# Patient Record
Sex: Female | Born: 1950 | Race: White | Hispanic: No | Marital: Married | Smoking: Never smoker
Health system: Southern US, Community
[De-identification: ages and names within clinical notes are randomized; demographics above are authoritative.]

## PROBLEM LIST (undated history)

## (undated) DIAGNOSIS — E119 Type 2 diabetes mellitus without complications: Secondary | ICD-10-CM

## (undated) DIAGNOSIS — R0600 Dyspnea, unspecified: Secondary | ICD-10-CM

## (undated) DIAGNOSIS — C3490 Malignant neoplasm of unspecified part of unspecified bronchus or lung: Secondary | ICD-10-CM

## (undated) DIAGNOSIS — C801 Malignant (primary) neoplasm, unspecified: Secondary | ICD-10-CM

## (undated) DIAGNOSIS — E7800 Pure hypercholesterolemia, unspecified: Secondary | ICD-10-CM

## (undated) HISTORY — DX: Type 2 diabetes mellitus without complications: E11.9

## (undated) HISTORY — PX: ABDOMINAL HYSTERECTOMY: SHX81

## (undated) HISTORY — PX: TUBAL LIGATION: SHX77

## (undated) HISTORY — PX: BREAST BIOPSY: SHX20

---

## 2000-01-19 ENCOUNTER — Encounter: Admission: RE | Admit: 2000-01-19 | Discharge: 2000-01-19 | Payer: Self-pay | Admitting: Internal Medicine

## 2000-01-19 ENCOUNTER — Encounter: Payer: Self-pay | Admitting: Internal Medicine

## 2000-10-09 ENCOUNTER — Other Ambulatory Visit: Admission: RE | Admit: 2000-10-09 | Discharge: 2000-10-09 | Payer: Self-pay | Admitting: *Deleted

## 2001-09-20 ENCOUNTER — Other Ambulatory Visit: Admission: RE | Admit: 2001-09-20 | Discharge: 2001-09-20 | Payer: Self-pay | Admitting: Obstetrics and Gynecology

## 2001-12-05 ENCOUNTER — Encounter: Payer: Self-pay | Admitting: Obstetrics and Gynecology

## 2001-12-05 ENCOUNTER — Encounter: Admission: RE | Admit: 2001-12-05 | Discharge: 2001-12-05 | Payer: Self-pay | Admitting: Obstetrics and Gynecology

## 2003-04-08 ENCOUNTER — Encounter: Admission: RE | Admit: 2003-04-08 | Discharge: 2003-04-08 | Payer: Self-pay | Admitting: Obstetrics and Gynecology

## 2004-05-20 ENCOUNTER — Encounter: Admission: RE | Admit: 2004-05-20 | Discharge: 2004-05-20 | Payer: Self-pay | Admitting: Gynecology

## 2004-09-08 ENCOUNTER — Other Ambulatory Visit: Admission: RE | Admit: 2004-09-08 | Discharge: 2004-09-08 | Payer: Self-pay | Admitting: Gynecology

## 2004-09-17 ENCOUNTER — Inpatient Hospital Stay (HOSPITAL_COMMUNITY): Admission: RE | Admit: 2004-09-17 | Discharge: 2004-09-19 | Payer: Self-pay | Admitting: Urology

## 2004-09-17 ENCOUNTER — Encounter (INDEPENDENT_AMBULATORY_CARE_PROVIDER_SITE_OTHER): Payer: Self-pay | Admitting: Specialist

## 2005-06-17 ENCOUNTER — Encounter: Admission: RE | Admit: 2005-06-17 | Discharge: 2005-06-17 | Payer: Self-pay | Admitting: Gynecology

## 2005-06-22 ENCOUNTER — Encounter: Admission: RE | Admit: 2005-06-22 | Discharge: 2005-06-22 | Payer: Self-pay | Admitting: Gynecology

## 2005-06-23 ENCOUNTER — Encounter: Admission: RE | Admit: 2005-06-23 | Discharge: 2005-06-23 | Payer: Self-pay | Admitting: Gynecology

## 2005-06-23 ENCOUNTER — Encounter (INDEPENDENT_AMBULATORY_CARE_PROVIDER_SITE_OTHER): Payer: Self-pay | Admitting: Specialist

## 2005-10-06 ENCOUNTER — Other Ambulatory Visit: Admission: RE | Admit: 2005-10-06 | Discharge: 2005-10-06 | Payer: Self-pay | Admitting: Gynecology

## 2007-02-01 ENCOUNTER — Encounter: Admission: RE | Admit: 2007-02-01 | Discharge: 2007-02-01 | Payer: Self-pay | Admitting: Allergy

## 2007-02-01 IMAGING — MG MM SCREEN MAMMOGRAM BILATERAL
4 series · 4 of 4 positions shown · non-contrast
Comparison: Prior studies.

DG SCREEN MAMMOGRAM BILATERAL
Bilateral CC and MLO view(s) were taken.

DIGITAL SCREENING MAMMOGRAM WITH CAD:

[R CC]
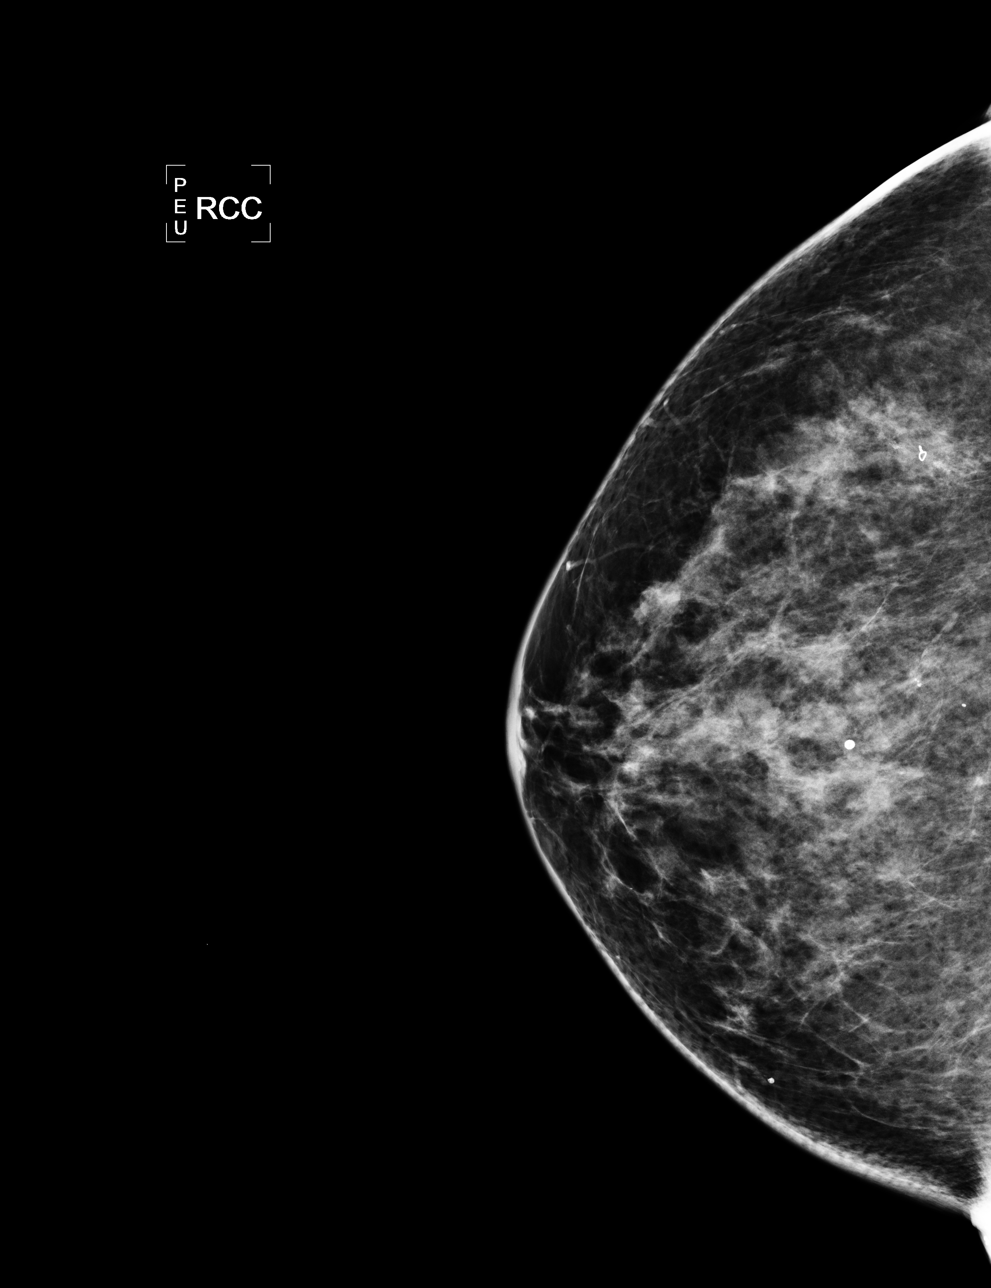

[L CC]
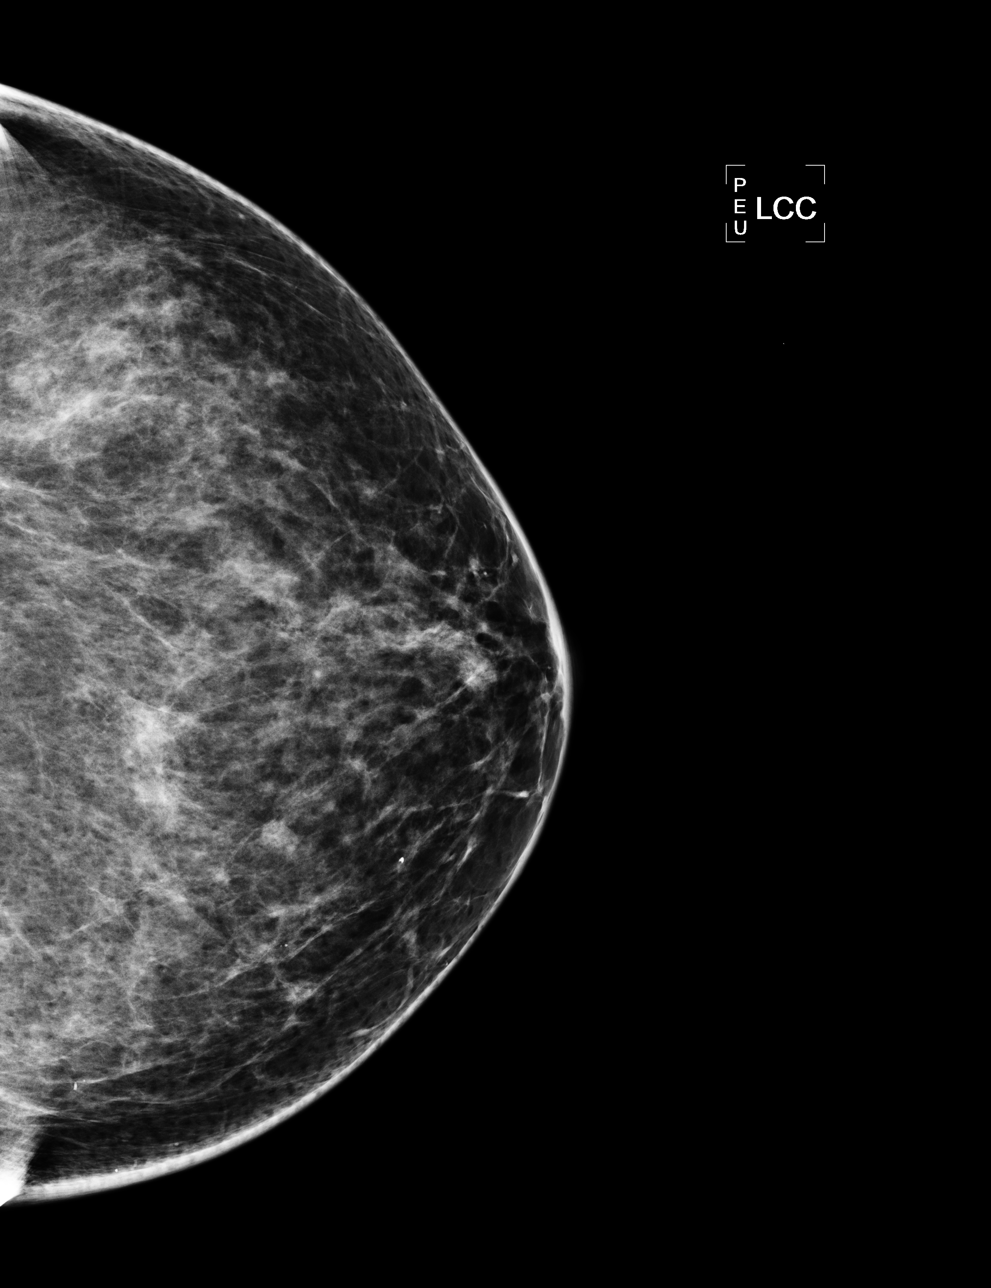

[L MLO]
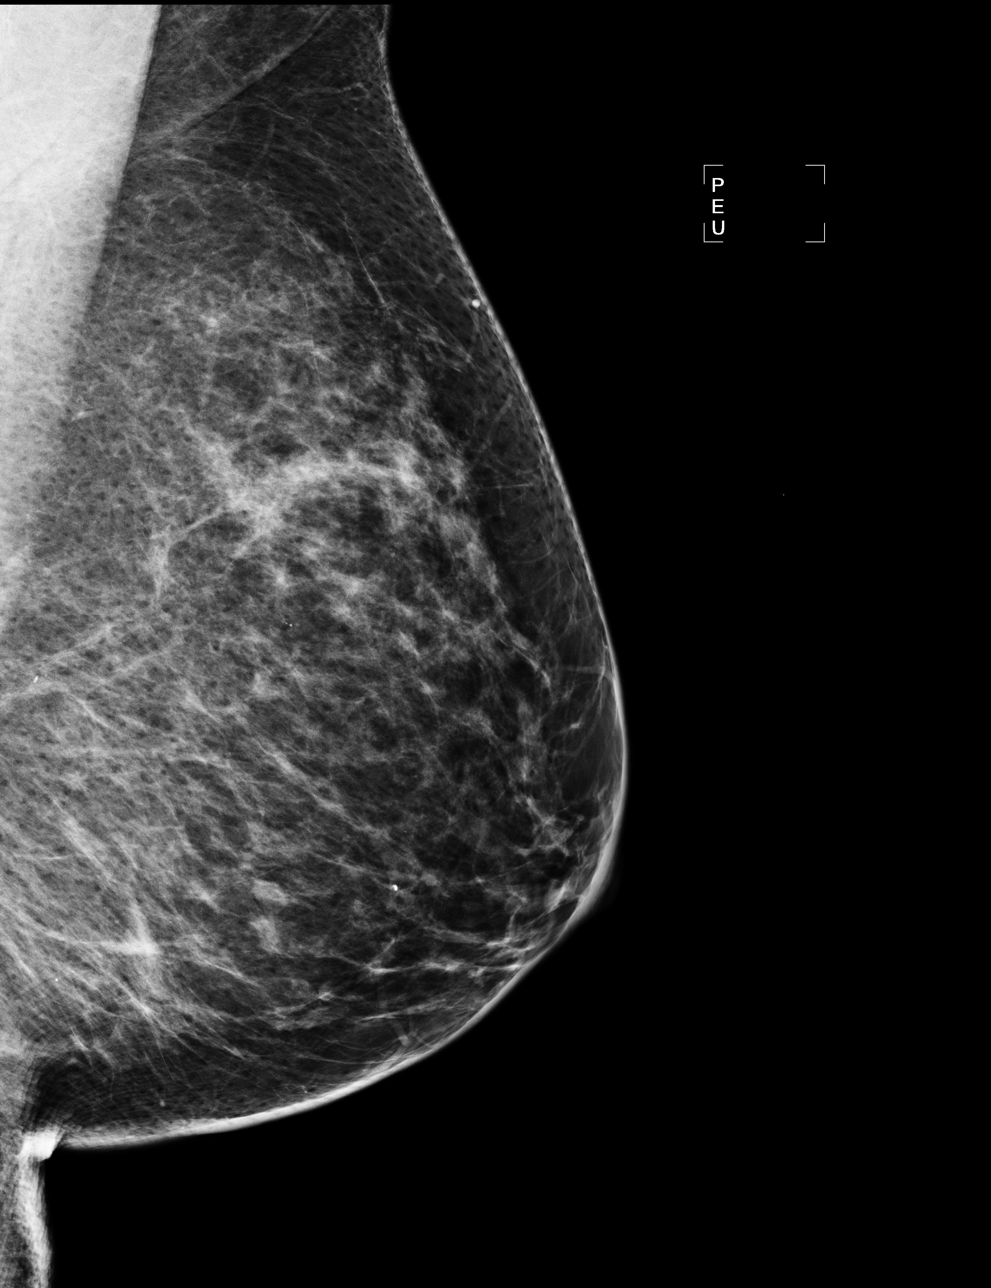

[R MLO]
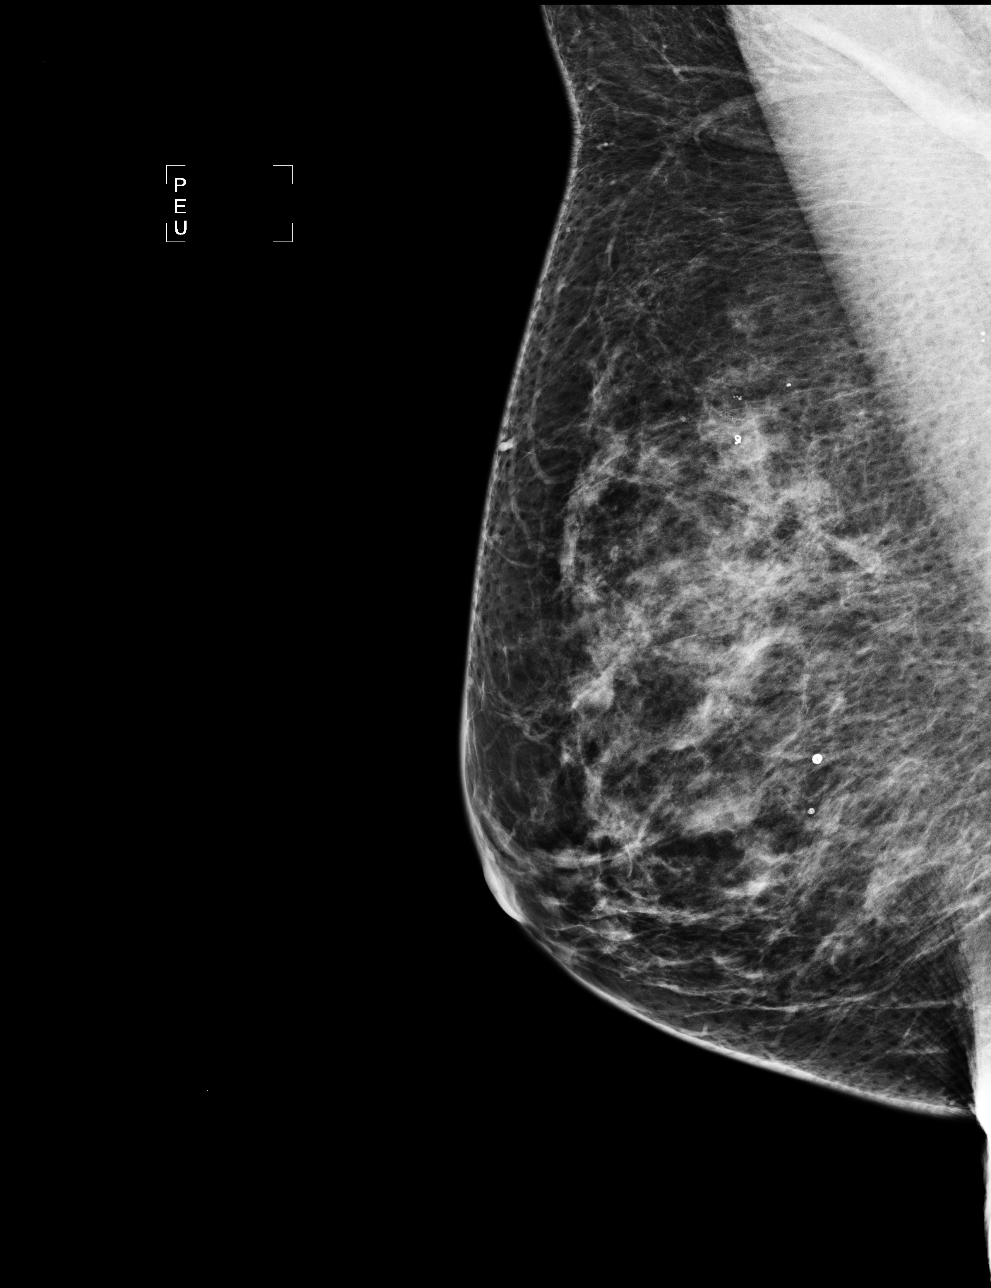

[4 of 4 positions shown; findings below may reference images not displayed]

The breast tissue is heterogeneously dense.  There is no dominant mass, architectural distortion or
calcification to suggest malignancy.
IMPRESSION: No mammographic evidence of malignancy.  Suggest yearly screening mammography.

ASSESSMENT: Negative - BI-RADS 1

Screening mammogram in 1 year.
ANALYZED BY COMPUTER AIDED DETECTION. , THIS PROCEDURE WAS A DIGITAL MAMMOGRAM.

## 2008-03-07 ENCOUNTER — Encounter: Admission: RE | Admit: 2008-03-07 | Discharge: 2008-03-07 | Payer: Self-pay | Admitting: Family Medicine

## 2008-03-07 IMAGING — MG MM SCREEN MAMMOGRAM BILATERAL
4 series · 4 of 4 positions shown · non-contrast
Comparison: Prior studies.

DG SCREEN MAMMOGRAM BILATERAL
Bilateral CC and MLO view(s) were taken.
Technologist: YOEL(YOEL)
Prior study comparison: [DATE], bilateral screening mammogram.

DIGITAL SCREENING MAMMOGRAM WITH CAD:

[R CC]
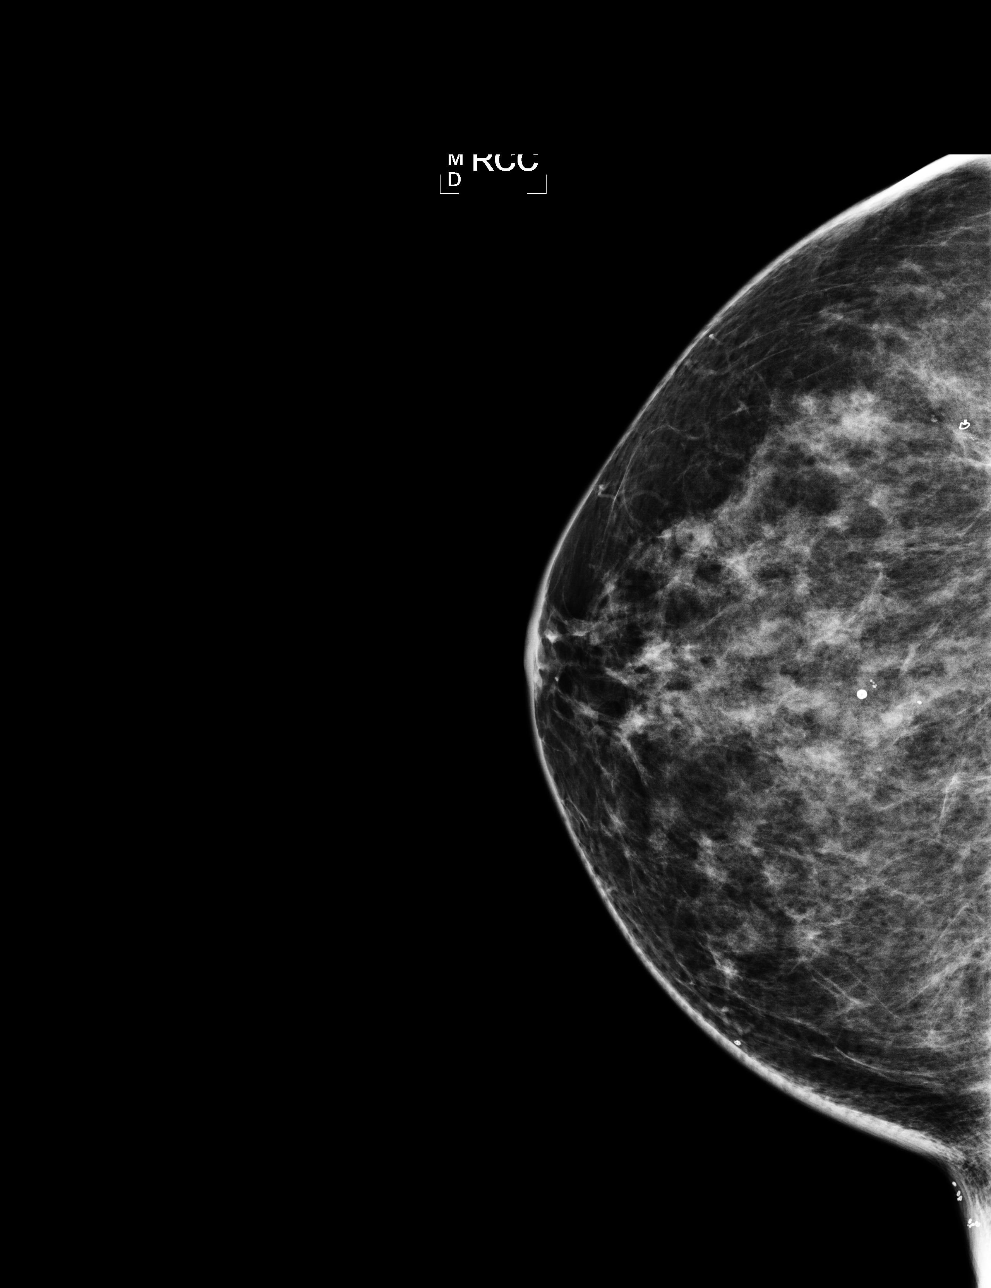

[L CC]
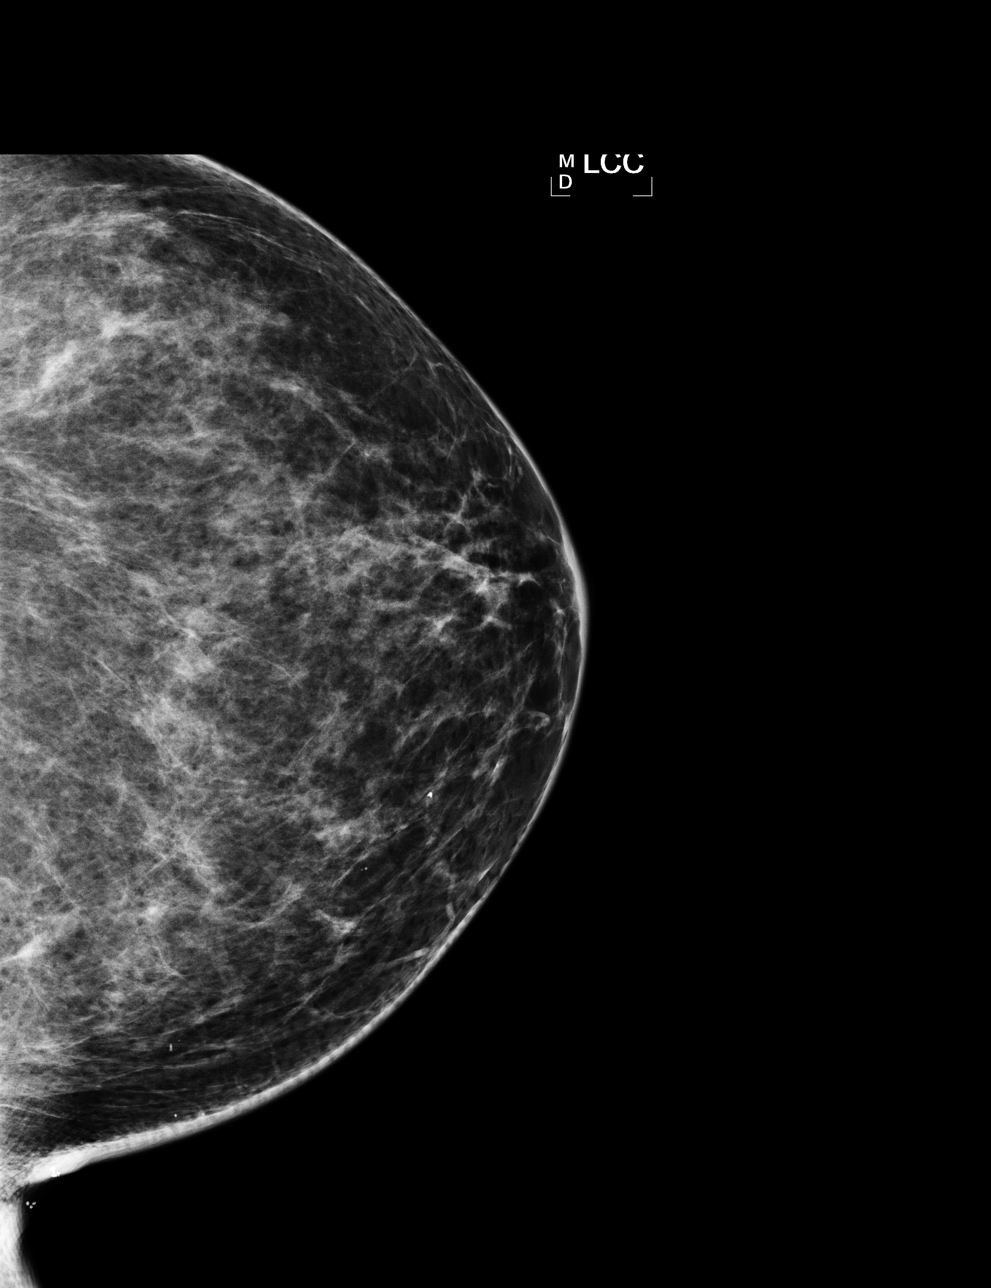

[L MLO]
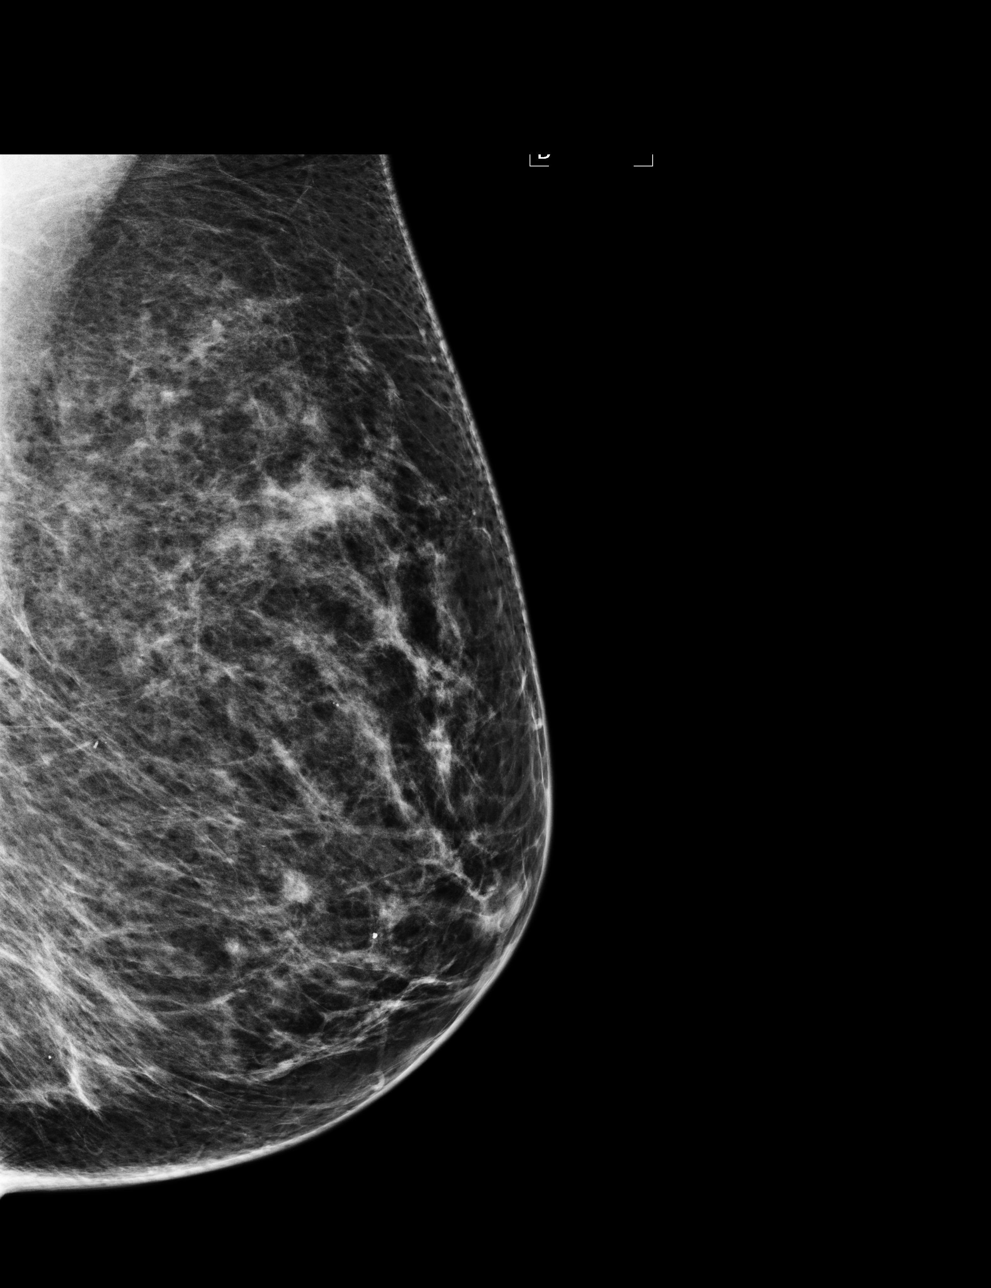

[R MLO]
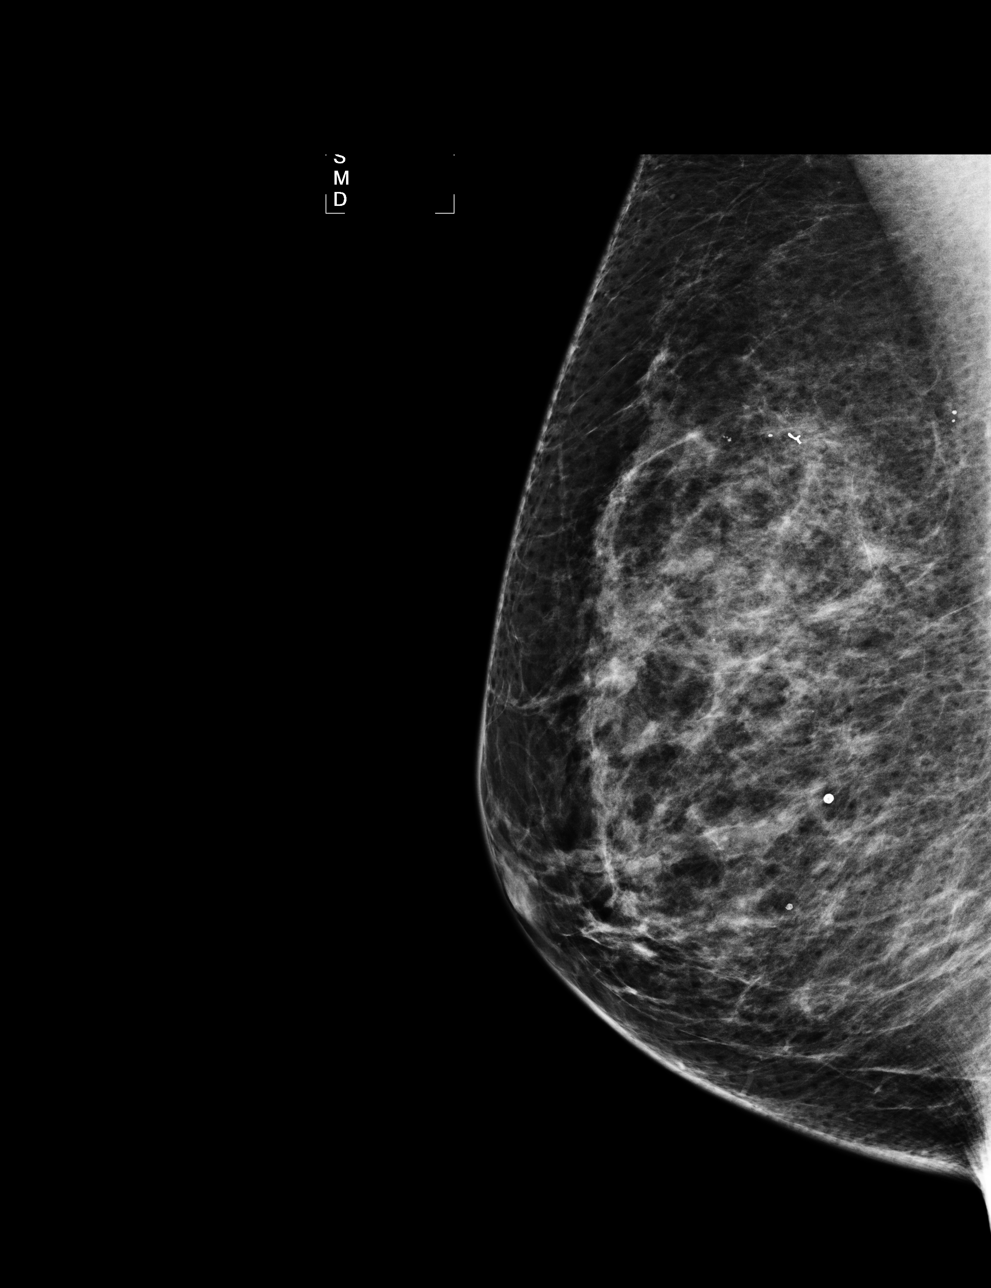

[4 of 4 positions shown; findings below may reference images not displayed]

There are scattered fibroglandular densities.  There is no dominant mass, architectural distortion 
or calcification to suggest malignancy.
IMPRESSION: No mammographic evidence of malignancy.  Suggest yearly screening mammography.

ASSESSMENT: Negative - BI-RADS 1

Screening mammogram in 1 year.
ANALYZED BY COMPUTER AIDED DETECTION. , THIS PROCEDURE WAS A DIGITAL MAMMOGRAM.

## 2009-03-18 ENCOUNTER — Encounter: Admission: RE | Admit: 2009-03-18 | Discharge: 2009-03-18 | Payer: Self-pay | Admitting: Family Medicine

## 2009-03-18 IMAGING — MG MM SCREEN MAMMOGRAM BILATERAL
4 series · 4 of 4 positions shown · non-contrast
Comparison: none

DG SCREEN MAMMOGRAM BILATERAL
Bilateral CC and MLO view(s) were taken.

DIGITAL SCREENING MAMMOGRAM WITH CAD:
The breast tissue is heterogeneously dense.  A possible mass is noted in the left breast.  Spot 
compression views and possibly sonography are recommended for further evaluation.  In the right 
breast, no masses or malignant type calcifications are identified.  Compared with prior studies.
Images were processed with CAD.

[R CC]
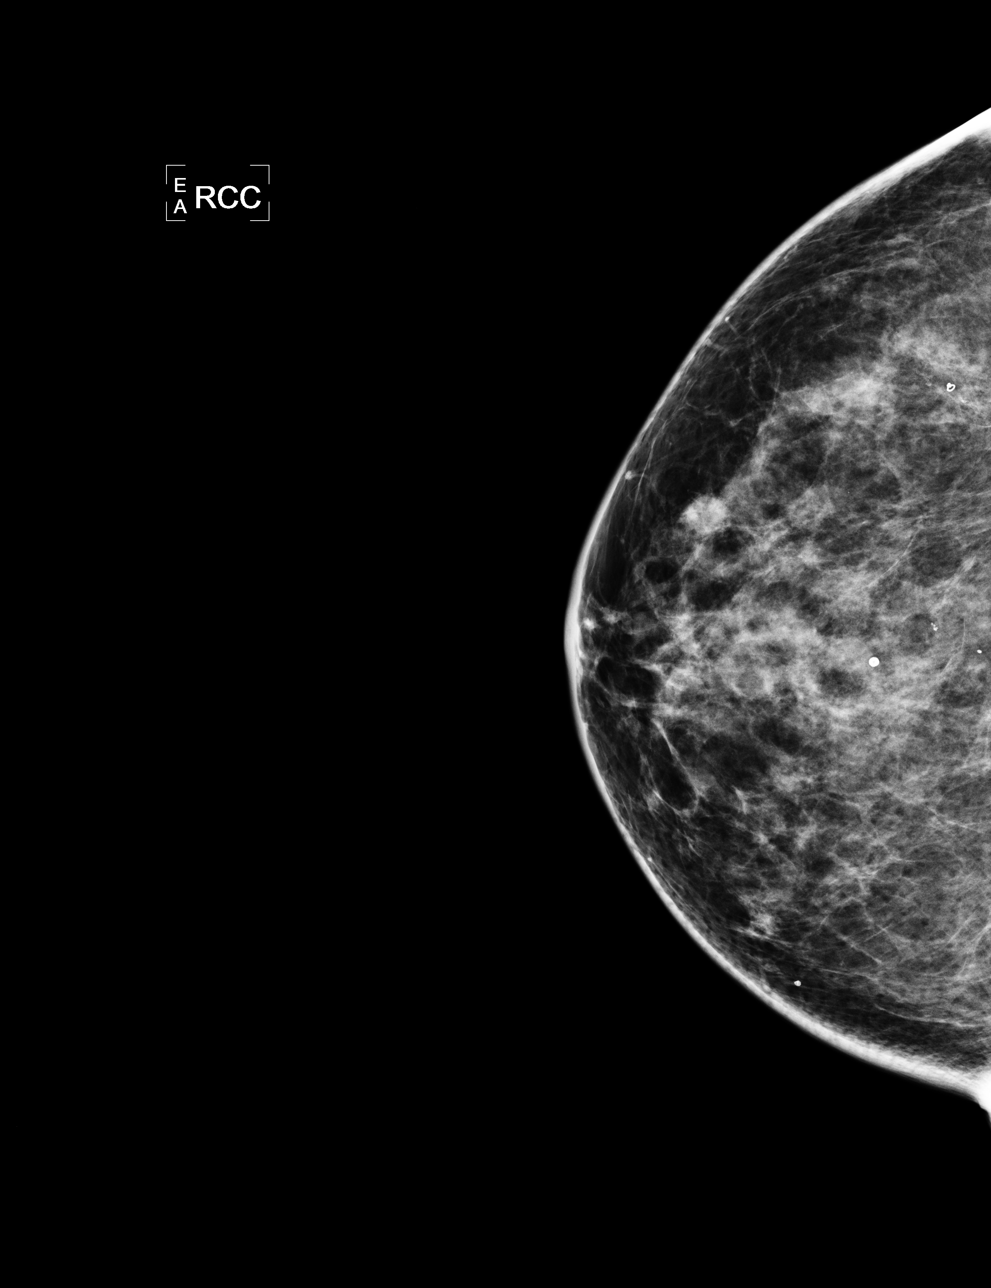

[L CC]
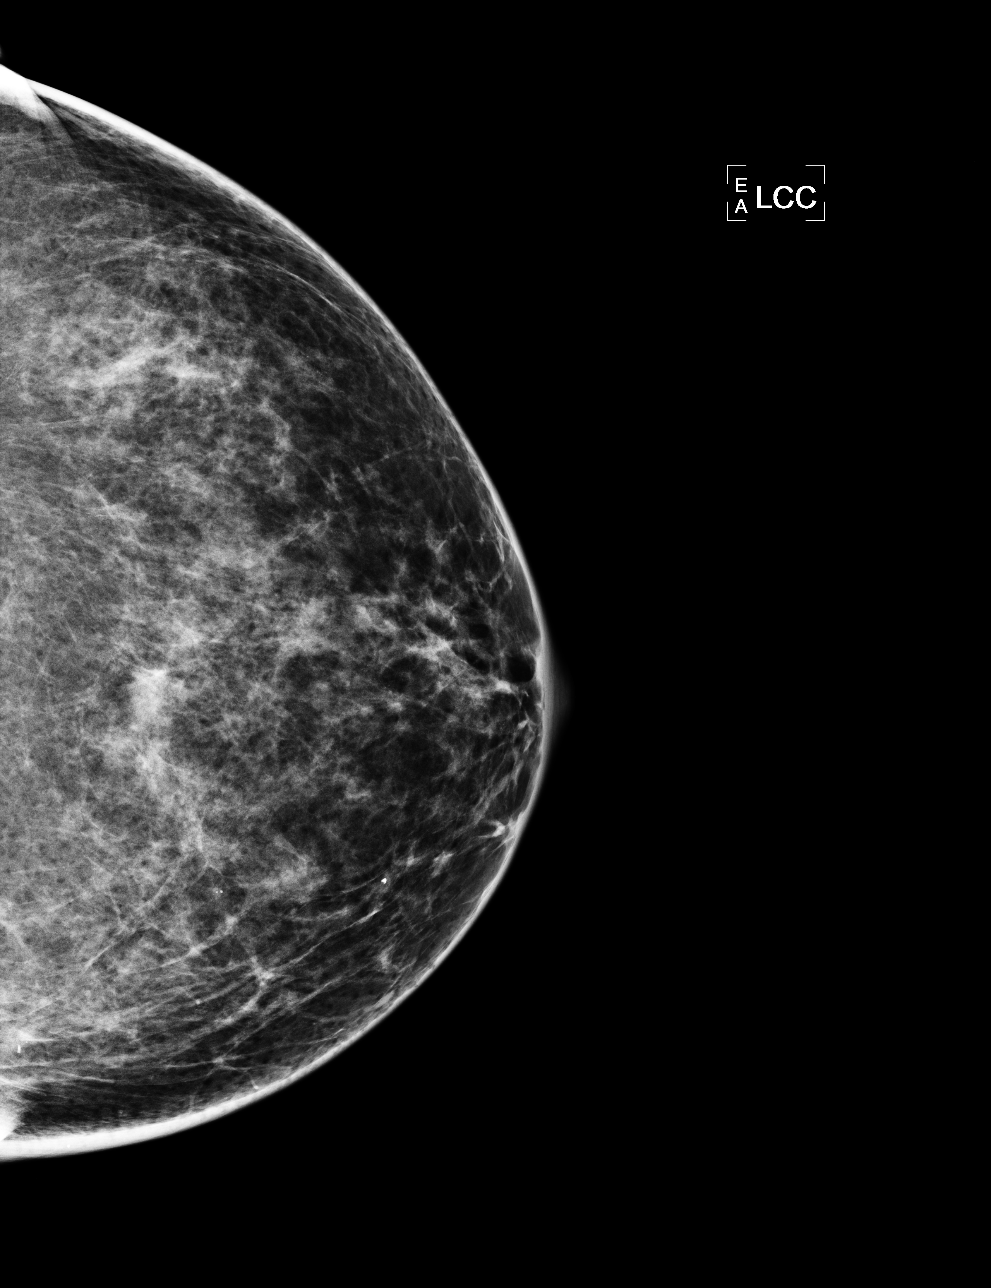

[L MLO]
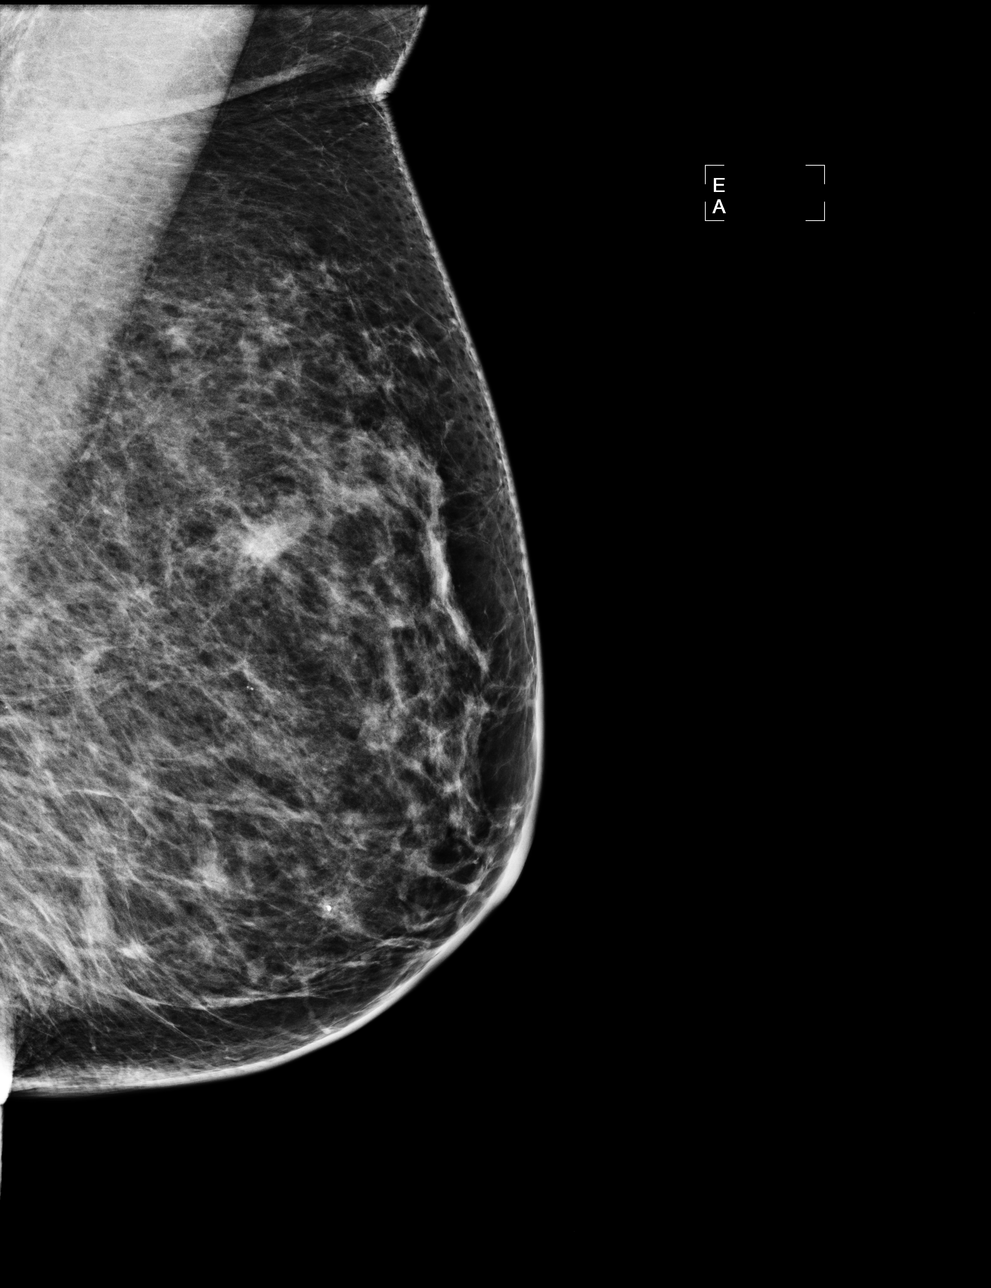

[R MLO]
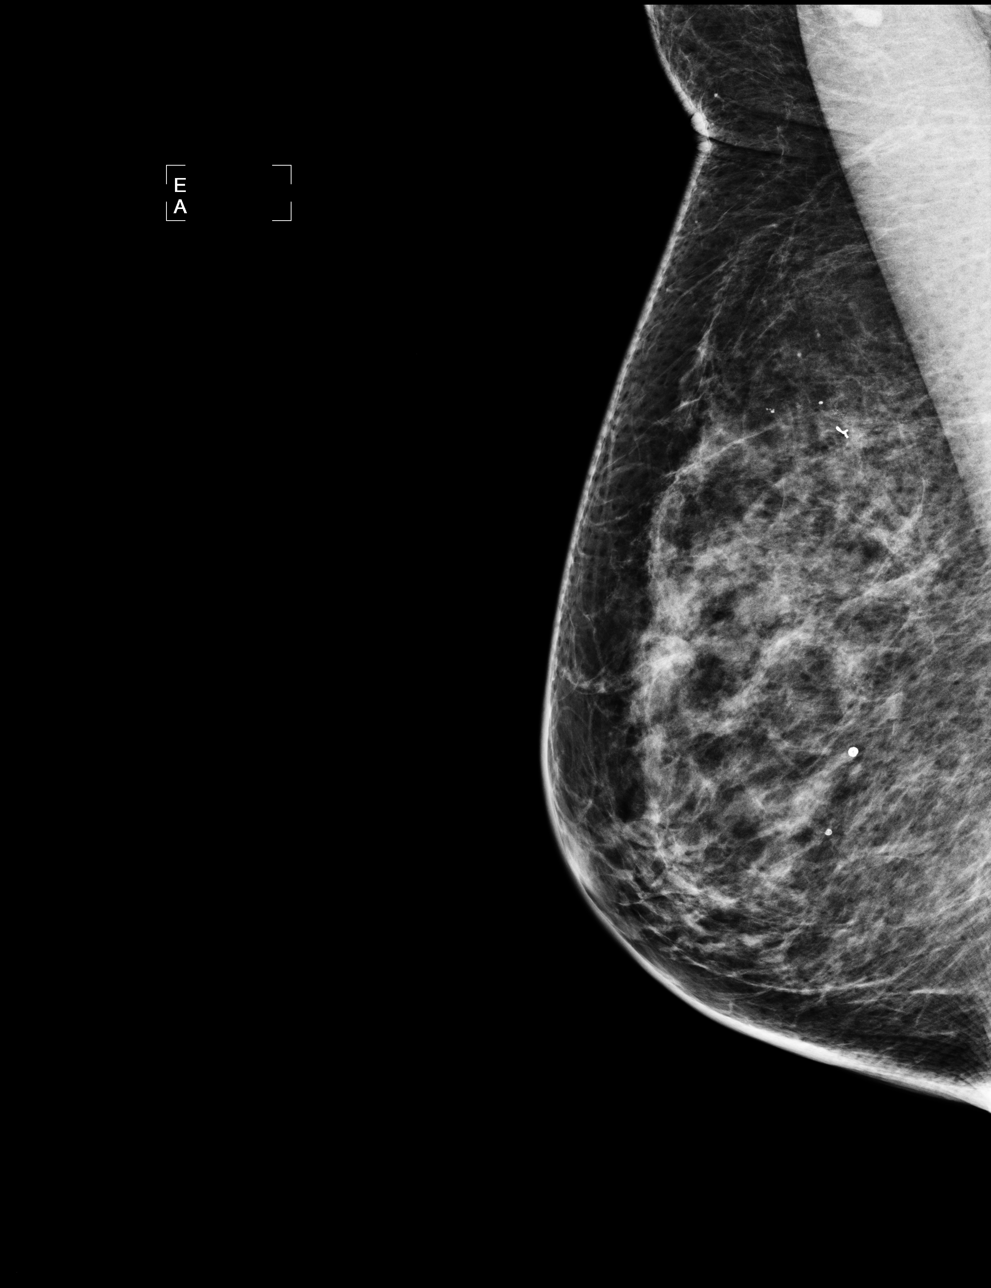

[4 of 4 positions shown; findings below may reference images not displayed]

IMPRESSION: Possible mass, left breast.  Additional evaluation is indicated.  The patient will be contacted for
additional studies and a supplementary report will follow.  No specific mammographic evidence of 
malignancy, right breast.

ASSESSMENT: Need additional imaging evaluation and/or prior mammograms for comparison - BI-RADS 0

Further imaging of the left breast.
,

## 2009-03-23 ENCOUNTER — Encounter: Admission: RE | Admit: 2009-03-23 | Discharge: 2009-03-23 | Payer: Self-pay | Admitting: Family Medicine

## 2009-03-23 IMAGING — MG MM DIAGNOSTIC LTD LEFT
2 series · 2 of 2 positions shown · non-contrast
Comparison: Prior studies

CLINICAL DATA: Abnormal screening mammogram.

DIGITAL DIAGNOSTIC  LEFT BREAST  MAMMOGRAM   AND LEFT BREAST
ULTRASOUND:

[L CC]
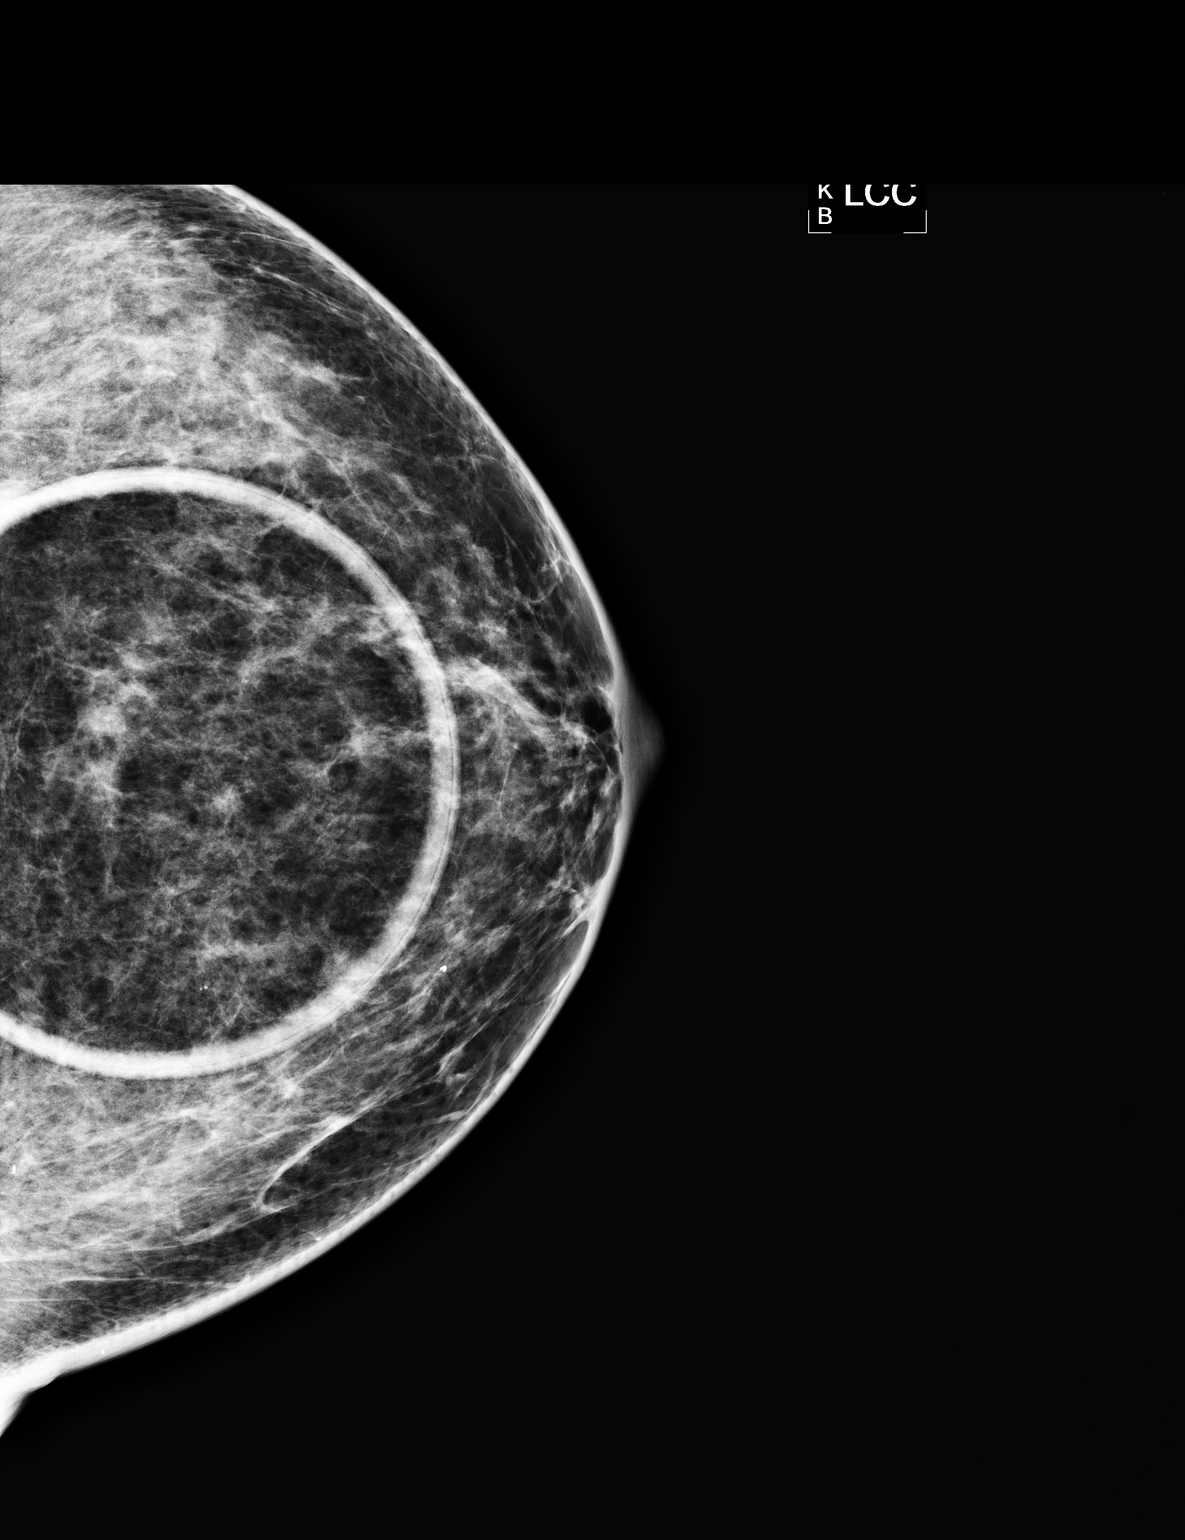

[L MLO]
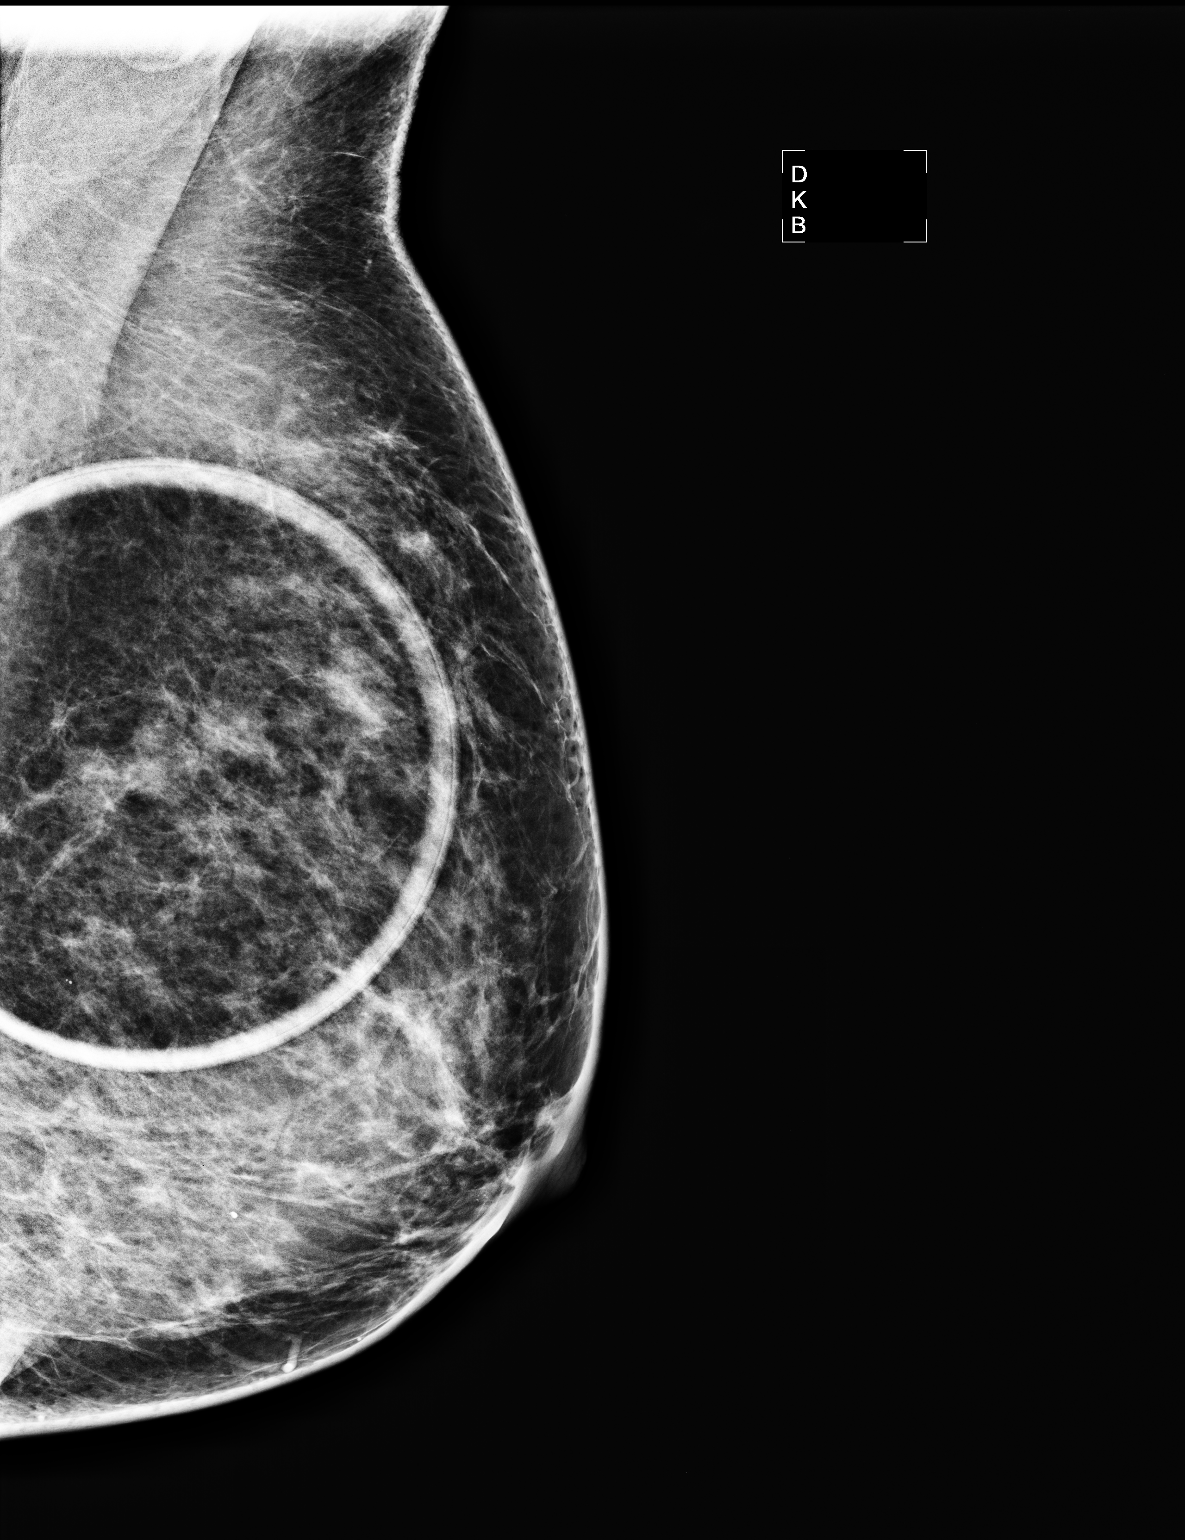

[2 of 2 positions shown; findings below may reference images not displayed]

FINDINGS: Spot compression views of the left breast demonstrate
that the area of questionable asymmetric density located superiorly
within the left breast appears similar to prior mammograms.  Most
likely the appearance is due to a summation shadow.

On physical exam, there is no discrete palpable abnormality.

Ultrasound is performed, showing normal-appearing fibroglandular
tissue within the superior portion of the left breast.  There is no
mass, cyst, distortion, or worrisome shadowing.
IMPRESSION: The area of questionable developing density located superiorly
within the left breast appears less prominent on additional views.
Most likely the appearance is due to a summation shadow.  However,
given the appearance on the initial screening study,  I recommend a
follow-up left breast diagnostic mammogram in 6 months.

BI-RADS CATEGORY 3:  Probably benign finding(s) - short interval
follow-up suggested.

## 2009-09-18 ENCOUNTER — Encounter: Admission: RE | Admit: 2009-09-18 | Discharge: 2009-09-18 | Payer: Self-pay | Admitting: Family Medicine

## 2009-09-18 IMAGING — MG MM DIAGNOSTIC UNILATERAL L
4 series · 4 of 4 positions shown · non-contrast
Comparison: [DATE] [DATE], [DATE], [DATE] [DATE], [DATE], [DATE] [DATE], [DATE],
[DATE] [DATE], [DATE], [DATE] [DATE], [DATE]

CLINICAL DATA: 6-month reevaluation left breast

DIGITAL DIAGNOSTIC LEFT MAMMOGRAM WITH CAD

[L CC]
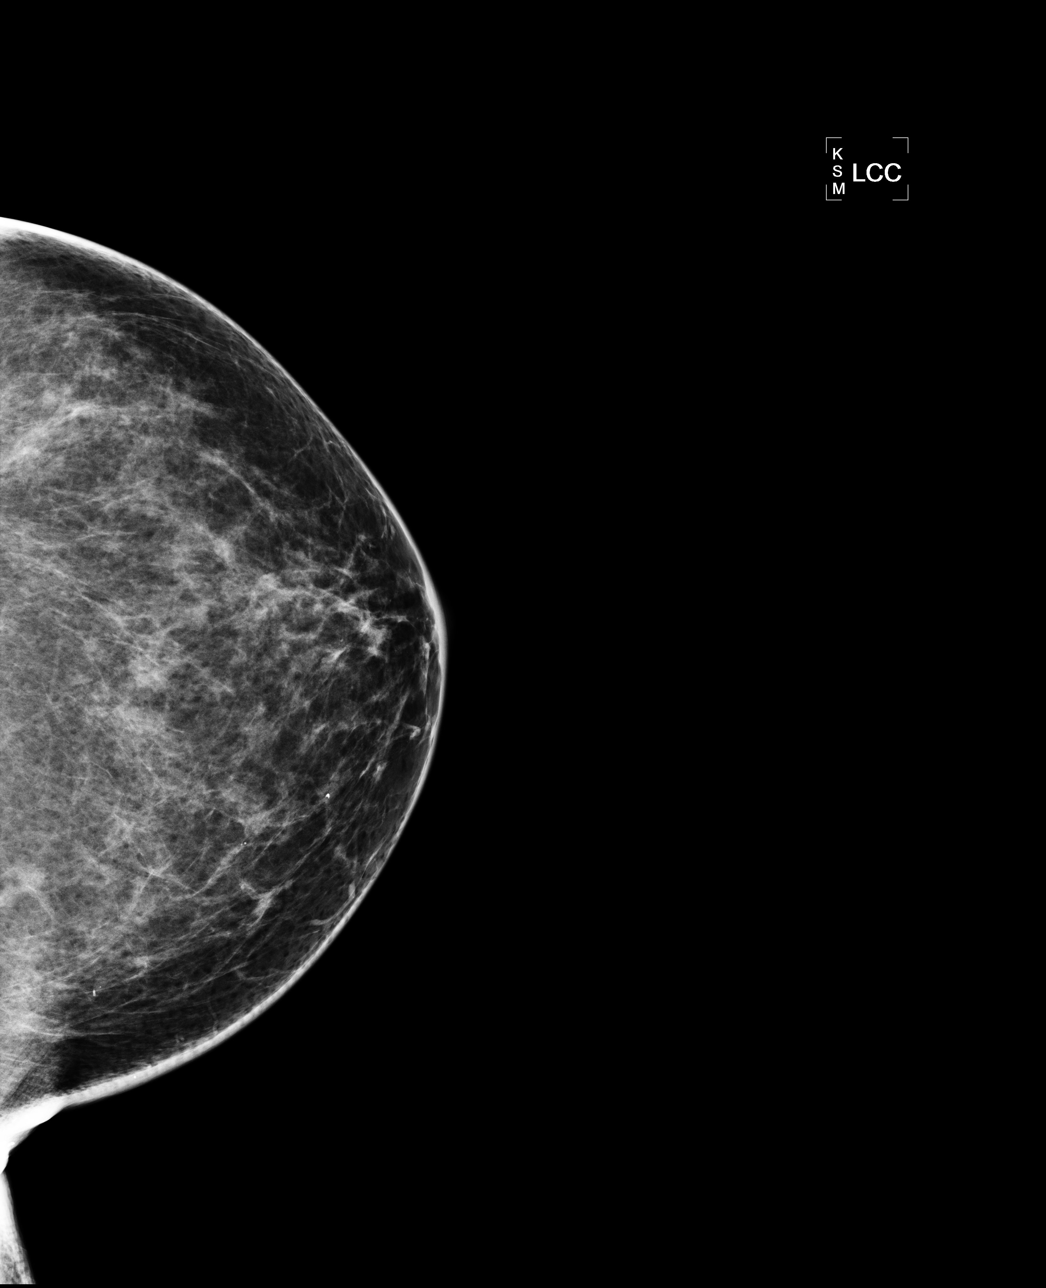

[L MLO (1 of 2)]
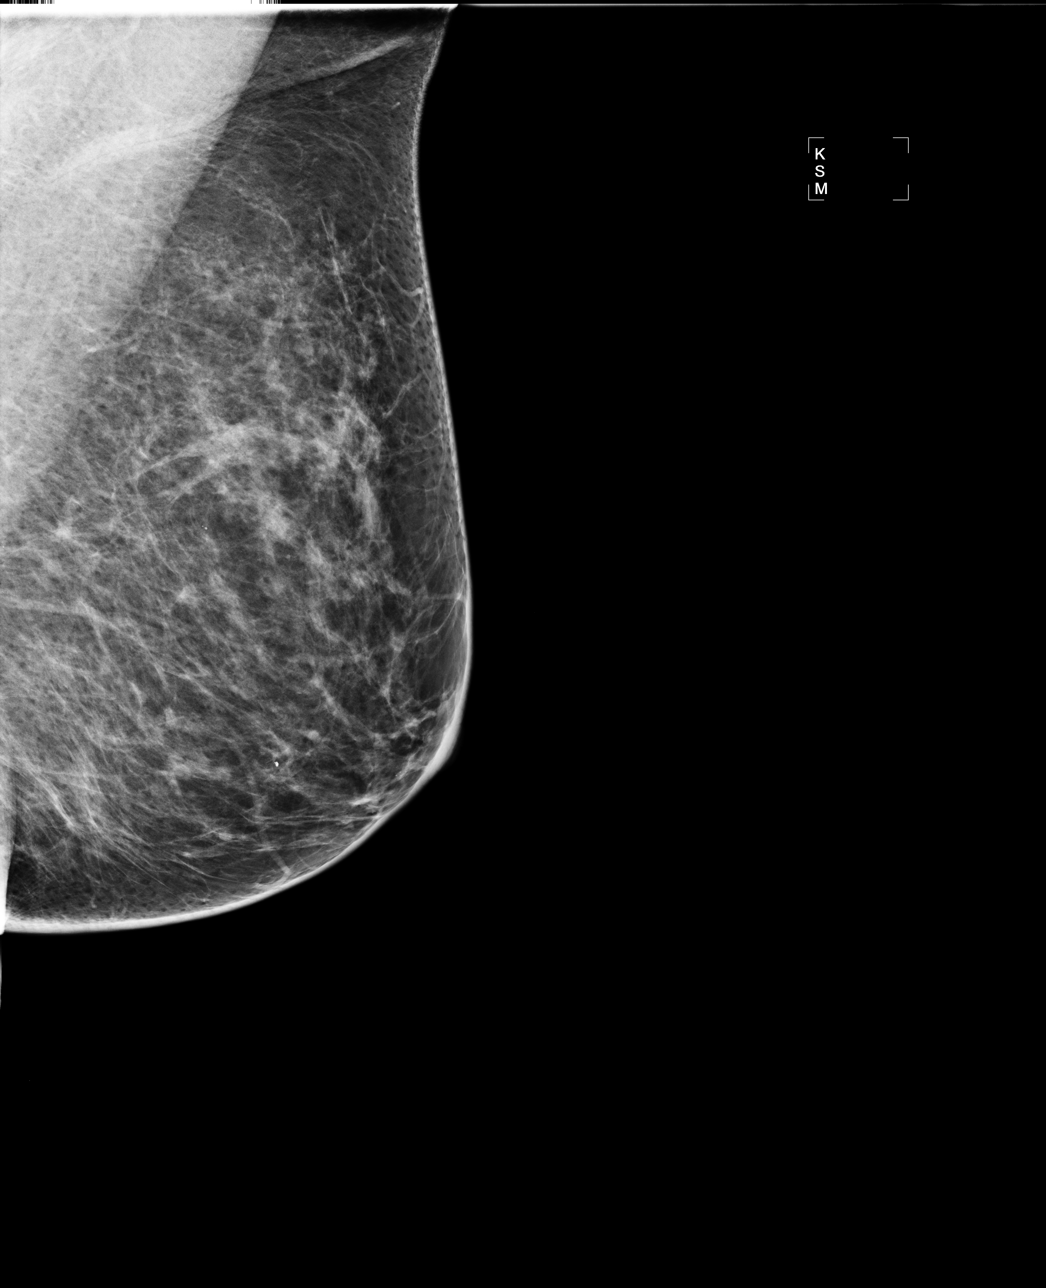

[L MLO (2 of 2)]
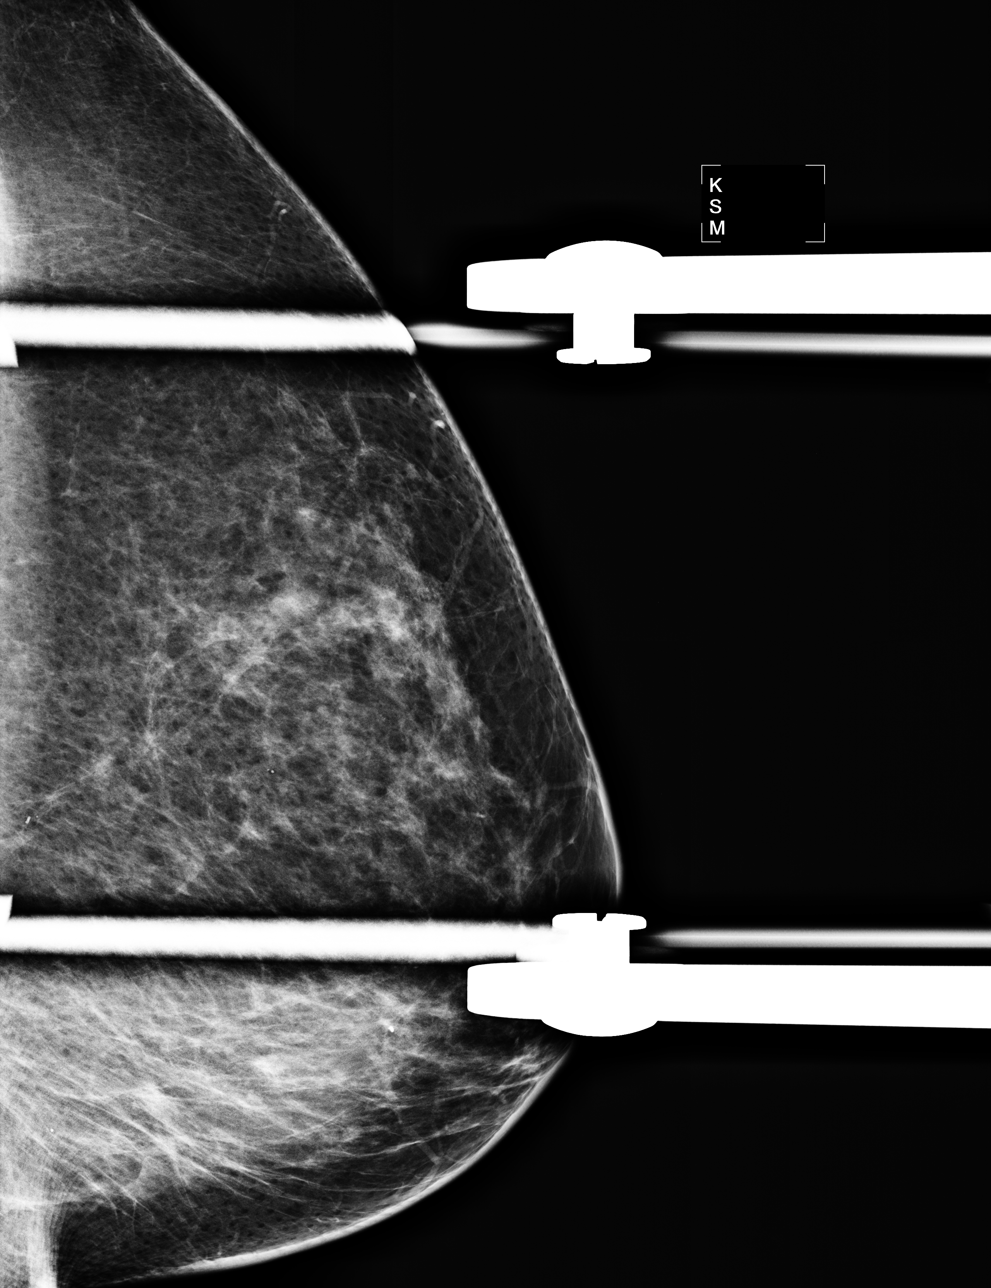

[L ML]
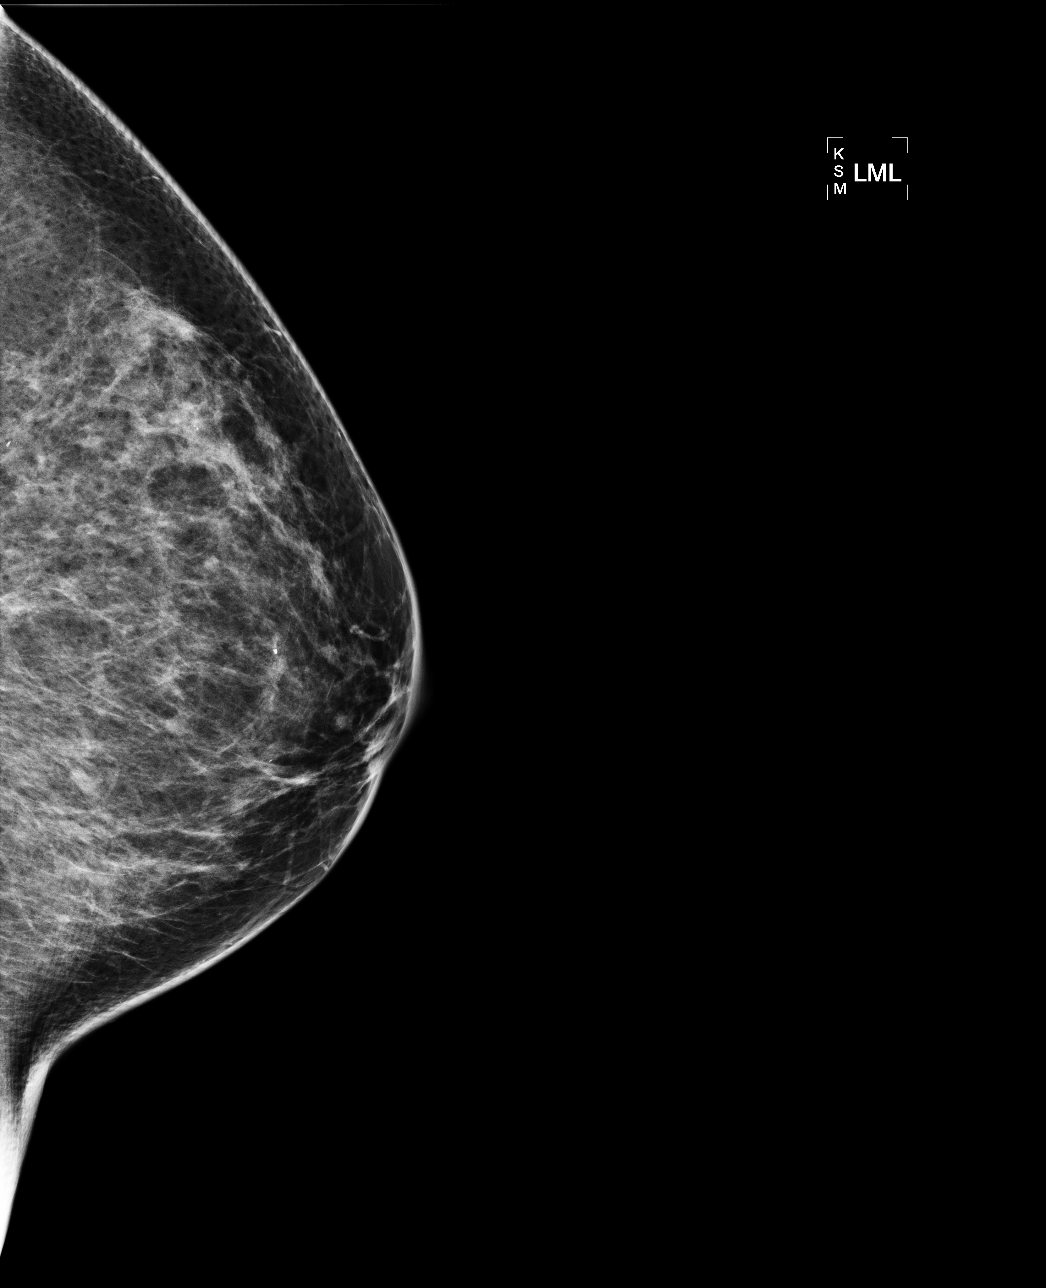

[4 of 4 positions shown; findings below may reference images not displayed]

FINDINGS: CC and MLO views of the left breast, lateral view left
breast, spot compression left MLO view are submitted.  Previously
noted asymmetry is not seen on the left CC view.  On the MLO view
the breast parenchyma in that area is unchanged compared prior exam
[DATE].
Mammographic images were processed with CAD.
IMPRESSION: Probable benign findings, recommend 6-month follow-up mammogram
left breast.

BI-RADS CATEGORY 3:  Probably benign finding(s) - short interval
follow-up suggested.

## 2010-03-22 ENCOUNTER — Encounter: Admission: RE | Admit: 2010-03-22 | Discharge: 2010-03-22 | Payer: Self-pay | Admitting: Family Medicine

## 2010-03-22 IMAGING — MG MM DIGITAL DIAGNOSTIC BILAT
4 series · 4 of 4 positions shown · non-contrast
Comparison: Prior studies

CLINICAL DATA: Short-term follow-up for questioned left breast
asymmetry

DIGITAL DIAGNOSTIC BILATERAL MAMMOGRAM WITH CAD

[R CC]
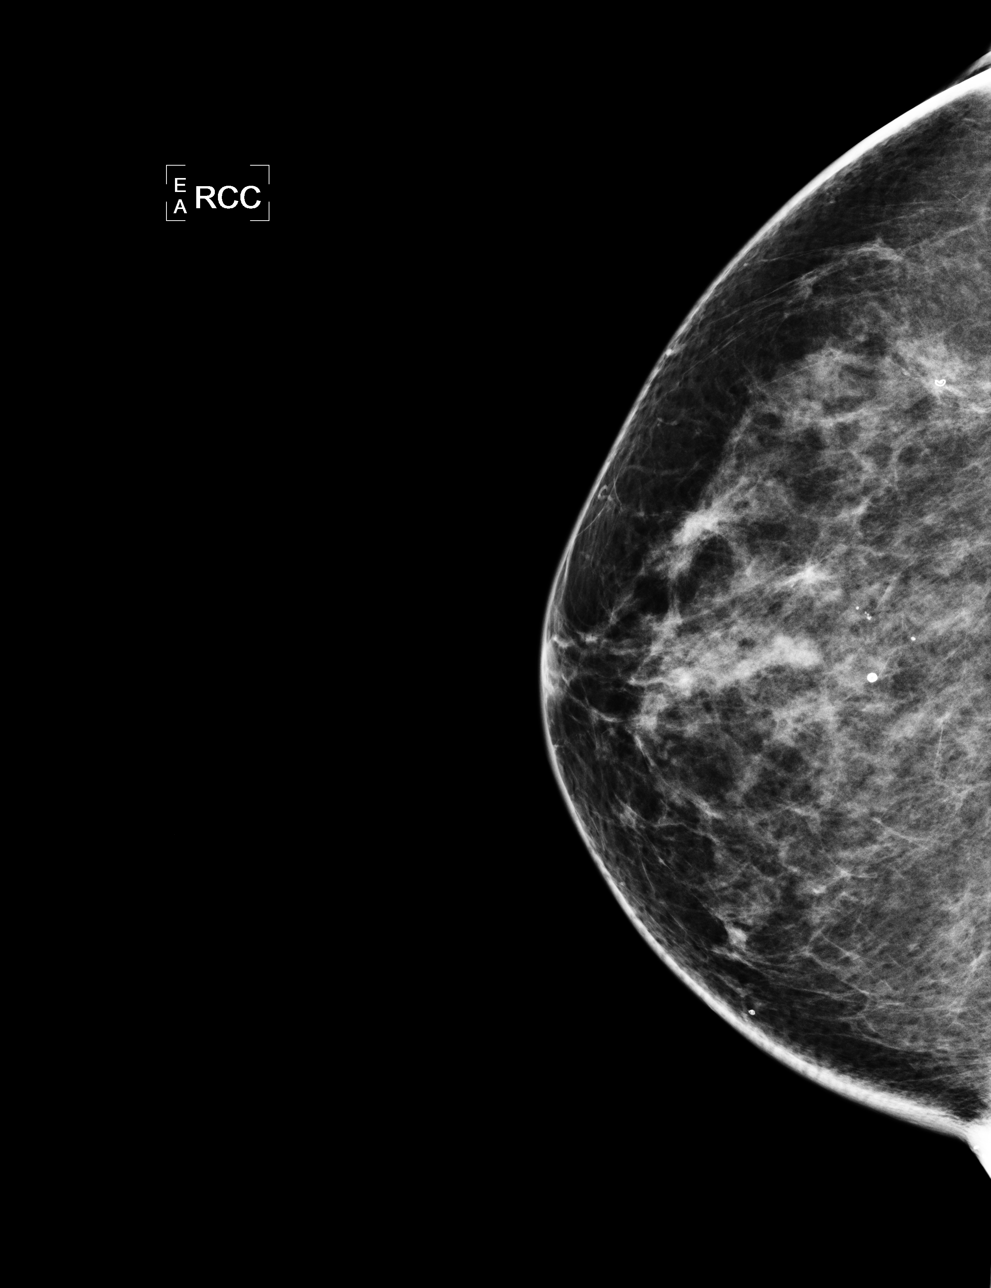

[L CC]
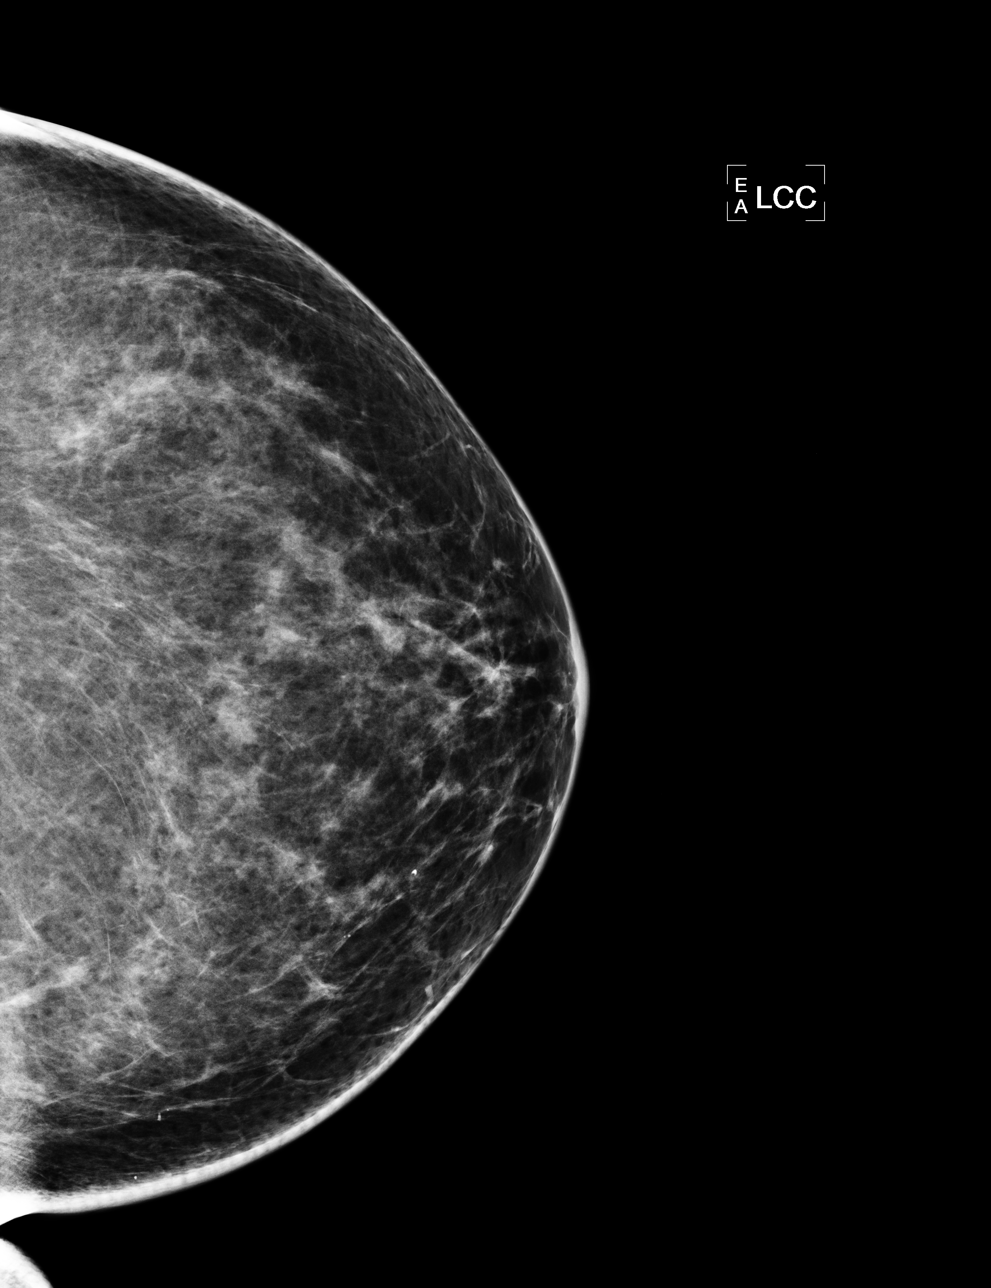

[L MLO]
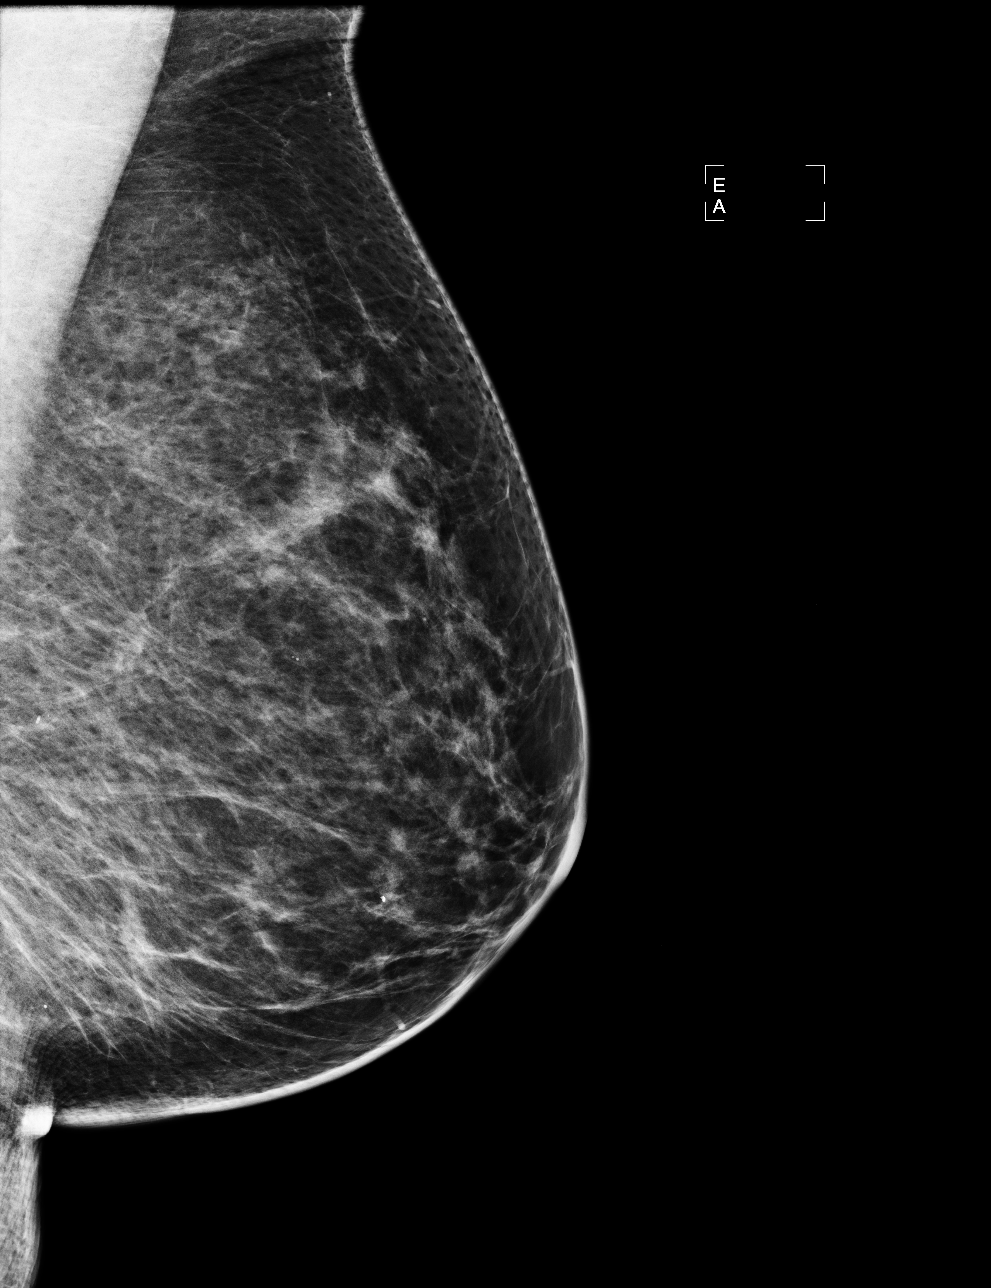

[R MLO]
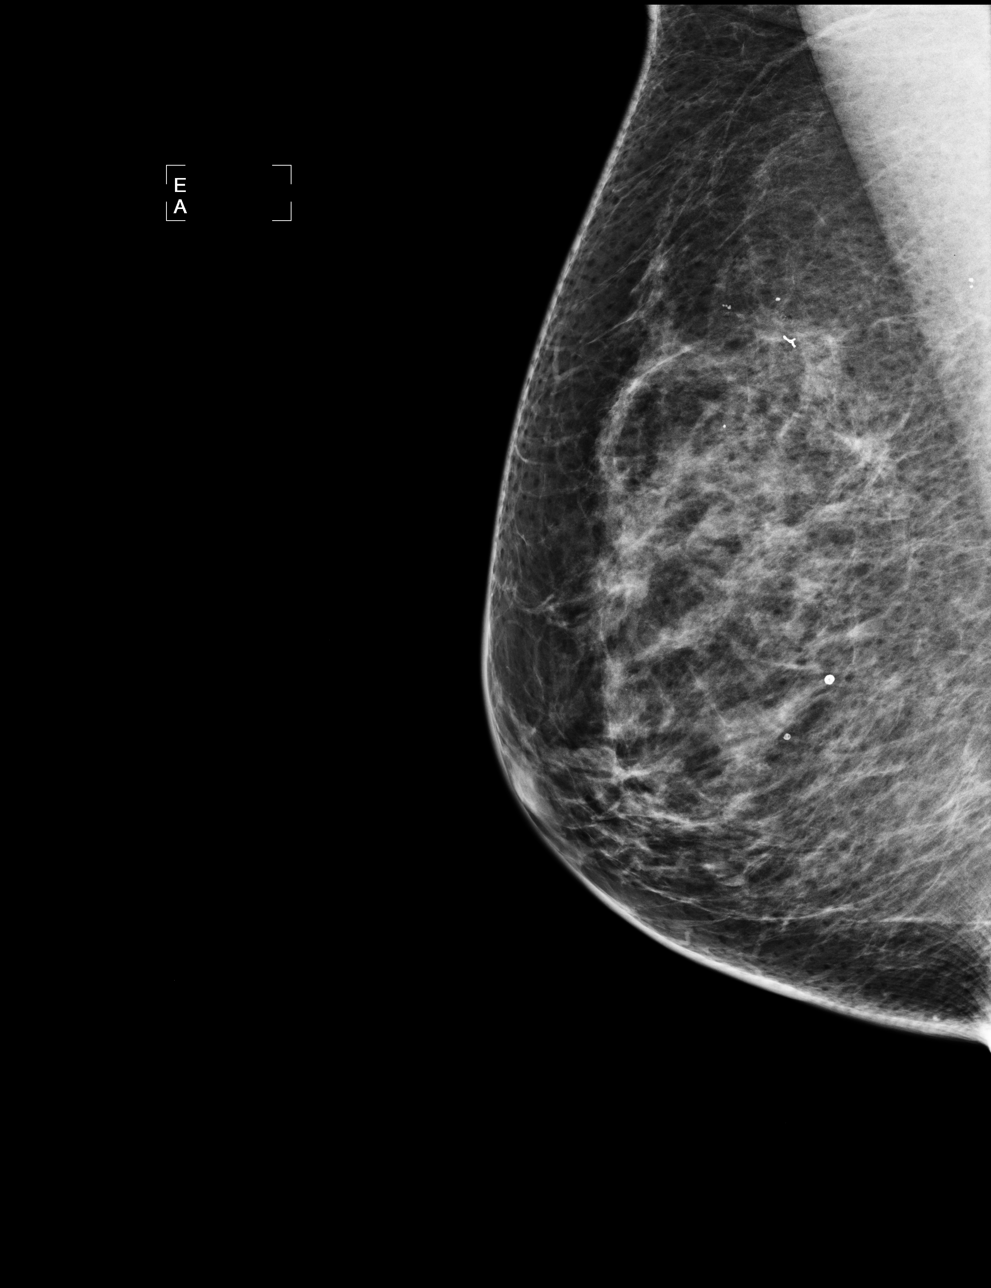

[4 of 4 positions shown; findings below may reference images not displayed]

FINDINGS: The area in question in the left breast is not
reproduced on today's imaging.  The area changes conformation and
demonstrates pliability, compatible with normal-appearing
fibroglandular tissue. No worrisome finding is seen. Breast
parenchymal pattern demonstrates scattered fibroglandular
densities.

Mammographic images were processed with CAD.
IMPRESSION: No evidence for malignancy in either breast. Screening mammography
is recommended in one year. Findings and recommendations discussed
with the patient and provided in written form at the time of the
exam.

BI-RADS CATEGORY 1:  Negative.

## 2010-07-05 ENCOUNTER — Encounter: Payer: Self-pay | Admitting: Family Medicine

## 2010-10-29 NOTE — Op Note (Signed)
Tami [name], Tami [name]          ACCOUNT NO.:  [id number]   MEDICAL RECORD NO.:  [id number]          PATIENT TYPE:  INP   LOCATION:  0464                         FACILITY:  [facility]   PHYSICIAN:  Juan H. Lily Peer, M.D.DATE OF BIRTH:  [birth year]   DATE OF PROCEDURE:  09/17/2004  DATE OF DISCHARGE:                                 OPERATIVE REPORT   SURGEON:  Juan H. Lily Peer, M.D.   FIRST ASSISTANT:  1.  Daniel L. Eda Paschal, M.D., gynecologist.  2.  Lucrezia Starch. Earlene Plater, M.D. (separate dictation).   INDICATIONS FOR PROCEDURE:  A 60 year old patient with symptomatic uterine  prolapse with second-degree cystocele and second-degree rectocele and stress  urinary incontinence.  Also with leiomyomatous uteri.   PREOPERATIVE DIAGNOSES:  1.  Leiomyomatous uteri.  2.  Uterine prolapse.  3.  Second-degree cystocele.  4.  Second-degree rectocele.  5.  Stress urinary incontinence.   POSTOPERATIVE DIAGNOSES:  1.  Leiomyomatous uteri.  2.  Uterine prolapse.  3.  Second-degree cystocele.  4.  Second-degree rectocele.  5.  Stress urinary incontinence.   ANESTHESIA:  General endotracheal anesthesia.   PROCEDURE PERFORMED:  1.  Transvaginal hysterectomy.  2.  Bilateral salpingo-oophorectomy.  3.  Anterior colporrhaphy.  4.  Posterior colporrhaphy.  5.  A suburethral sling procedure, by Dr. Darvin Neighbours, urologist.  A separate      procedure and dictation.   FINDINGS:  1.  Pelvic relaxation.  2.  Second-degree cystocele.  3.  Second-degree rectocele.  4.  No evidence of enterocele.  5.  Leiomyomata uteri.   DESCRIPTION OF PROCEDURE:  After the patient was adequately counseled, she  was taken to the operating room, where she underwent a successful general  endotracheal anesthesia.  She had received 2 gm of Cefotan for prophylaxis.  She had PAS stockings for DVT prophylaxis as well.  She was placed in the  high lithotomy position.  The abdomen, vagina, and perineum were prepped and  draped  in the usual sterile fashion.  A Foley catheter had been inserted.  A  short, weighted-bill speculum was placed in the posterior vaginal vault and  two side wall retractors were utilized for exposure.  The anterior and  posterior cervical lip was grasped with Lahey thyroid clamps and the  ectocervix was relaxed to the point that it came to the level of the  introitus.  Lidocaine 1% with 1:100,000 epinephrine was infiltrated into the  cervical vaginal fold for approximately 10 cc.  A circumferential cervical  vaginal incision was made.  A posterior colpotomy was undertaken, and both  the uterosacral ligaments were clamped, cut, suture-ligated with 0 Vicryl  suture, and tagged.  The anterior colpotomy was established, and the  remaining broad and cardinal ligaments were serially clamped, cut, and  suture-ligated with 0 Vicryl suture, allowing removal of the uterus and the  cervix.  The left tube and ovary was brought into view, and the left  infundibulopelvic ligament was clamped.  The left tube and ovary were passed  off the operative field.  The left infundibulopelvic ligament was secured  with a free tie of 0 Vicryl suture followed  by a transfixation stitch of 0  Vicryl suture.  A similar procedure was carried out on the contralateral  side, and after ascertaining adequate hemostasis, a McCall culdoplasty was  established with 2-0 Vicryl suture and tagged at the midline in an effort  for it to be secured after completion of the closure of the vaginal cuff.  This second portion of the operation consisted of anterior colporrhaphy,  tracked with scissor dissection of the vaginal mucosa to the region of the  external urethral meatus.  Traction was maintained in the vagina along the  course of the dissection to keep the walls of the bladder separate from the  vaginal mucosa in order to avoid any trauma to the bladder.  Sharp  dissection of the adherent fascia from the vaginal mucosa was  undertaken.  The dissection was deep enough to reach the white, relatively avascular  plane, beneath the vaginal epithelium.  Blunt finger dissection with a  single thickness of gauze sponge was utilized to apply sufficient traction  against the fascia to free it from the mucosa beneath the urethra and the  bladder.  Beginning at the external meatus, excessive vertical mattress  sutures of 2-0 Vicryl suture were placed in the mobilized periurethral  fascia.  The last suture at the bladder neck was anchored at the posterior  pubourethral ligament.  The pubourethral ligament was identified, and the  suture passed through the periurethral fascia, avoiding the sling.  It was  inserted through the pubourethral ligament along the posterior aspect of the  symphysis pubis.  A second suture was placed on the opposite side of the  urethra, where we incorporated the same anatomical structures.  The sutures  were tied and drawn to the periurethral fascia beneath the urethra and  helping elevate the posterior urethra as well as avoiding the suburethral  sling.  The repair of the cystocele was undertaken after excision of  redundant vaginal mucosa was excised and with a 2-0 Vicryl suture in a  running stitch fashion, the anterior vaginal mucosa was brought together.  At this point, the [facility] culdoplasty was secured after the vaginal cuff was  closed with interrupted sutures of 0 Vicryl suture.  The final portion of  the procedure consisted of the posterior repair of the moderate-sized  rectocele.  A V-shaped incision was made from the angles of the introitus  over the perineal body.  A midline vertical incision extended to the apex  over the maximum area of the defect of the rectocele.  Pararectal fascia was  separated from the mucosa by sharp dissection and plicated over the anterior  wall of the rectum.  The levator ani muscles were approximated in the midline with deep sutures of 1-0 Vicryl suture.   The excess mucosa was  excised, and the margins were reapproximated with a running stitch of 0  Vicryl suture.  Kerlix gauze impregnated with Premarin cream was placed into  the vagina.  The patient was extubated and transferred to the recovery room  with stable vital signs.  Blood loss from the procedure was recorded at 550  cc.  Specimen weighed 405 gm.  Urine output was 325 cc and clear.  IV fluids  consisted of 3350 cc of lactated Ringer's.  She did receive 2 gm of Cefotan  for prophylaxis.      JHF/MEDQ  D:  09/17/2004  T:  09/17/2004  Job:  [id number]

## 2010-10-29 NOTE — H&P (Signed)
Tami [name], Tami [name]          ACCOUNT NO.:  [id number]   MEDICAL RECORD NO.:  [id number]           PATIENT TYPE:   LOCATION:                               FACILITY:  [facility]   PHYSICIAN:  Juan H. Lily Peer, M.D.DATE OF BIRTH:  1950-10-07   DATE OF ADMISSION:  09/17/2004  DATE OF DISCHARGE:                                HISTORY & PHYSICAL   CHIEF COMPLAINT:  Symptomatic cystocele, rectocele and stress urinary  incontinence along with uterine fibroids.   HISTORY:  The patient is a 60 year old, gravida 3, para 3, who was seen in  the office on March 29 for her complete exam and also for further discussion  of her ongoing problem with worsening of her stress urinary incontinence and  some uterine descensus.  The patient was seen back in August 2005. It  appeared that she had a second-degree to third-degree cystocele and a first-  degree rectocele with some mild uterine descensus.  She had been seen by Dr.  Darvin Neighbours, urologist, who will concurrently, after completion of the  transvaginal hysterectomy and A&P repair.  He will follow with a sling  procedure for stress urinary incontinence.  The patient had an ultrasound  back in August 2005 whereby the uterus measured 10.4 x 7 x 9 cm with several  intramural myomas.  The largest one measured 3 x 3 cm.  Her right ovary had  a small corpus luteum cyst less than 0.5 cm in diameter and the left ovary  was normal.  There was no fluid in the cul-de-sac.  Her recent Pap smear in  March of this year was normal.   ALLERGIES:  She denies any allergies.   MEDICATIONS:  She had been followed by her internist who had her on Vytorin  and another statin called Tricor.  She is on calcium supplementation also  with vitamin D.   PAST SURGICAL HISTORY:  1.  The only surgery she has ever had was laparoscopic tubal ligation.  2.  She has had three normal vaginal deliveries.  3.  Endometrial biopsy in 2005 with benign secretory endometrium  reported.   PHYSICAL EXAMINATION:  VITAL SIGNS:  The patient weighs 158 pounds, height 5  feet 3-1/4 inches tall. Blood pressure 134/80.  HEENT: Unremarkable.  NECK:  Supple.  Trachea midline.  No carotid bruits.  No thyromegaly.  LUNGS:  Clear to auscultation without rhonchi or wheezes.  HEART:  Regular rate and rhythm.  No murmurs or gallops.  BREASTS:  Both breasts are symmetrical in appearance.  No skin discoloration  or nipple inversion.  No palpable masses or tenderness.  No supraclavicular  or axillary lymphadenopathy.  ABDOMEN:  Soft, nontender without rebound or guarding.  PELVIC:  Bartholin's, urethra and Skein glands within normal limits.  The  patient appeared to have a 12-week size irregular-shaped uterus but mobile.  No palpable adnexal masses.  The patient appears to have a second-degree  cystocele and a first-degree rectocele with some moderate descensus.  No  enterocele was appreciated.  RECTAL:  The patient with apparent first-degree rectocele.   ASSESSMENT:  A 60 year old gravida 3, para 3,  with symptomatic stress  urinary incontinence, second-degree cystocele, first-degree rectocele in  moderate uterine descensus and scheduled to undergo a transvaginal  hysterectomy with bilateral salpingo-oophorectomy and concurrently the  urologist, Dr. Darvin Neighbours, will proceed with a sling procedure or stress  urinary incontinence.  The patient is aware that both ovaries will need to  be removed and there is the possibility she may need to be placed on hormone  replacement therapy to combat her vasomotor symptoms.  The women's health  initiative trial findings from the study were discussed.  We will try to put  her on lowest dose in the shortest amount of time.  Encourage her calcium  supplementation.  The risks and benefits and pros and cons of the surgery  were discussed.  We also discussed the risk of surgery to include infection  although she will receive anaphylactic  antibiotics.  There is also the risk  for deep venous thrombosis for which she will have PSA stockings for  prophylaxis.  Also in the event of any technical difficulty, the procedure  may need to be completed with an open laparotomy technique to complete the  operation.  Also potential risk of trauma to internal organs or nearby  structures or blood vessels which may need to be corrected at time of  surgery were discussed.  In the event that she were to have uncontrollable  bleeding or were to need a blood transfusion, she was fully aware of the  potential risks such as anaphylactic reaction, hepatitis and AIDS.  Literature information from the [employer] of OB/GYN had previously  been provided and all questions were answered and will follow accordingly.   PLAN:  The patient is scheduled for transvaginal hysterectomy with bilateral  salpingo-oophorectomy, anterior and posterior colporrhaphy with sling  procedure, Friday, April 7, at 7:30 a.m. at [facility]  The  patient was instructed two weeks prior to surgery to apply Premarin vaginal  cream intravaginally at night.      JHF/MEDQ  D:  09/16/2004  T:  09/16/2004  Job:  [id number]

## 2010-10-29 NOTE — Discharge Summary (Signed)
NAMEMARGENE, [name]          ACCOUNT NO.:  [id number]   MEDICAL RECORD NO.:  [id number]          PATIENT TYPE:  INP   LOCATION:  0464                         FACILITY:  [facility]   PHYSICIAN:  Juan H. Lily Peer, M.D.DATE OF BIRTH:  May 22, 1951   DATE OF ADMISSION:  09/17/2004  DATE OF DISCHARGE:  09/19/2004                                 DISCHARGE SUMMARY   TOTAL DAYS HOSPITALIZED:  2.   HISTORY OF PRESENT ILLNESS:  The patient is a 60 year old gravida 3, para 3,  who underwent a transvaginal hysterectomy with bilateral salpingo-  oophorectomy with anterior colporrhaphy and posterior colporrhaphy along  with a suburethral sling (sling performed by Dr. Darvin Neighbours, urologist)  secondary to leiomyomatous uteri, uterine prolapse, second-degree cystocele,  along with a second-degree rectocele and stress urinary incontinence.  The  patient did well postoperatively.  On postoperative day #1, she was  afebrile, had not passed flatus yet but her hemoglobin and hematocrit were  10.4 and 30.9, respectively, with a platelet count of 274,000.  Her Foley  catheter was removed after 24 hours and she began voiding spontaneously.  She was started on a clear liquid diet and eventually advanced to her  regular diet.  She had been up and ambulating and tolerating a regular diet  well.  On the evening of 04/08, she had a small bowel movement.  She has  passing gas, ambulating, voiding well, and tolerating a regular diet.  She  has showered and was ready to be discharged home.  She was voiding  spontaneously.   FINAL DIAGNOSES:  1.  Leiomyomatous uteri.  2.  Uterine prolapse.  3.  Secondary cystocele.  4.  Second-degree rectocele.  5.  No evidence of enterocele intraoperatively.  6.  Stress urinary incontinence.   PROCEDURE PERFORMED:  1.  Transvaginal hysterectomy.  2.  Bilateral salpingo-oophorectomy.  3.  Anterior colporrhaphy.  4.  Posterior colporrhaphy.  5.  Suburethral sling procedure by  Dr. Darvin Neighbours, urologist.   FINAL DISPOSITION/FOLLOWUP:  The patient was discharged home on her second  postoperative day.  She was instructed to follow up with Dr. Darvin Neighbours,  urologist, in 1 week as per his instructions.  She is to follow up with me  in 2 weeks for her GYN postop visit.  She was given a prescription for  Lortab 7.5/500 to take 1 p.o. q.4-6h. p.r.n. pain.  She was taking stool  softeners at home that were keeping her stools soft; I encouraged her to  continue that and to keep up with her fluids.  When we see her in the office  in 2 weeks, we will discuss putting her on iron  supplementation.  I do not want her to be constipated and have to strain  right now due to her rectocele repair.  She was instructed not to be engaged  in any lifting or strenuous activity and shower/bath.  She was given a  prescription for hormone replacement therapy consisting of Estratest 0.625  mg 1 p.o. q.d.      JHF/MEDQ  D:  09/19/2004  T:  09/19/2004  Job:  [id number]

## 2010-10-29 NOTE — Op Note (Signed)
Tami [name], Tami [name]          ACCOUNT NO.:  [id number]   MEDICAL RECORD NO.:  [id number]          PATIENT TYPE:  INP   LOCATION:  0001                         FACILITY:  [facility]   PHYSICIAN:  Ronald L. Earlene Plater, M.D.  DATE OF BIRTH:  13-Sep-1950   DATE OF PROCEDURE:  DATE OF DISCHARGE:                                 OPERATIVE REPORT   PREOPERATIVE DIAGNOSIS:  Stress urinary incontinence.   OPERATION/PROCEDURE:  Placement of [employer] suprapubic sling.   SURGEON:  Lucrezia Starch. Earlene Plater, M.D.   ASSISTANT:  Gaetano Hawthorne. Lily Peer, M.D.   ESTIMATED BLOOD LOSS:  150 mL.   INDICATIONS FOR PROCEDURE:  [name] is a nice, 60 year old white  female who presents needing hysterectomy, anterior and posterior repair.  Additionally she is having stress urinary incontinence and a work-up was  found to have a component of type 3 urinary incontinence.  It was elected  after understanding the risks, benefits and alternatives to proceed with  suprapubic sling.   DESCRIPTION OF PROCEDURE:  A hysterectomy had been completed.  Anterior flap  was opened.  I was asked into the operating room.  The bladder had been  drained and punch holes were obtained suprapubically, one fingerbreadth  anterior to the pubic symphysis and two fingerbreadths laterally.  Utilizing  the [employer] device, punch holes were carried out through the  endopelvic fascia and delivered into the field.  Cystourethroscopy was then  performed.  There were no perforations noted.  The bladder was essentially  normal.  Efflux of clear urine was noted bilaterally.  Both the 12-degree  and 70-degree lenses were utilized and the bladder was fully distended.  The  sling was then placed in position.  The tab was cut and it was delivered  with absolutely no tension on it.  There was some slight oozing from the  right side but it was quickly controlled.  The cystourethroscopy was then  performed again and again there was  absolutely no perforations noted.  The  sling was cut at the level of the skin to be sealed later with Dermabond and  the case was turned back over to Dr. Lily Peer.      RLD/MEDQ  D:  09/17/2004  T:  09/17/2004  Job:  [id number]

## 2011-03-04 ENCOUNTER — Other Ambulatory Visit: Payer: Self-pay | Admitting: Family Medicine

## 2011-03-04 DIAGNOSIS — Z1231 Encounter for screening mammogram for malignant neoplasm of breast: Secondary | ICD-10-CM

## 2011-03-25 ENCOUNTER — Ambulatory Visit
Admission: RE | Admit: 2011-03-25 | Discharge: 2011-03-25 | Disposition: A | Discharge status: Home / Self Care | Payer: BC Managed Care – PPO | Source: Ambulatory Visit | Attending: Family Medicine | Admitting: Family Medicine

## 2011-03-25 DIAGNOSIS — Z1231 Encounter for screening mammogram for malignant neoplasm of breast: Secondary | ICD-10-CM

## 2011-03-25 IMAGING — MG MM DIGITAL SCREENING {BCG}
4 series · 4 of 4 positions shown · non-contrast
Comparison: none

DG SCREEN MAMMOGRAM BILATERAL
Bilateral CC and MLO view(s) were taken.

DIGITAL SCREENING MAMMOGRAM WITH CAD:
The breast tissue is heterogeneously dense.  No masses or malignant type calcifications are 
identified.  Compared with prior studies.
Images were processed with CAD.

[R CC]
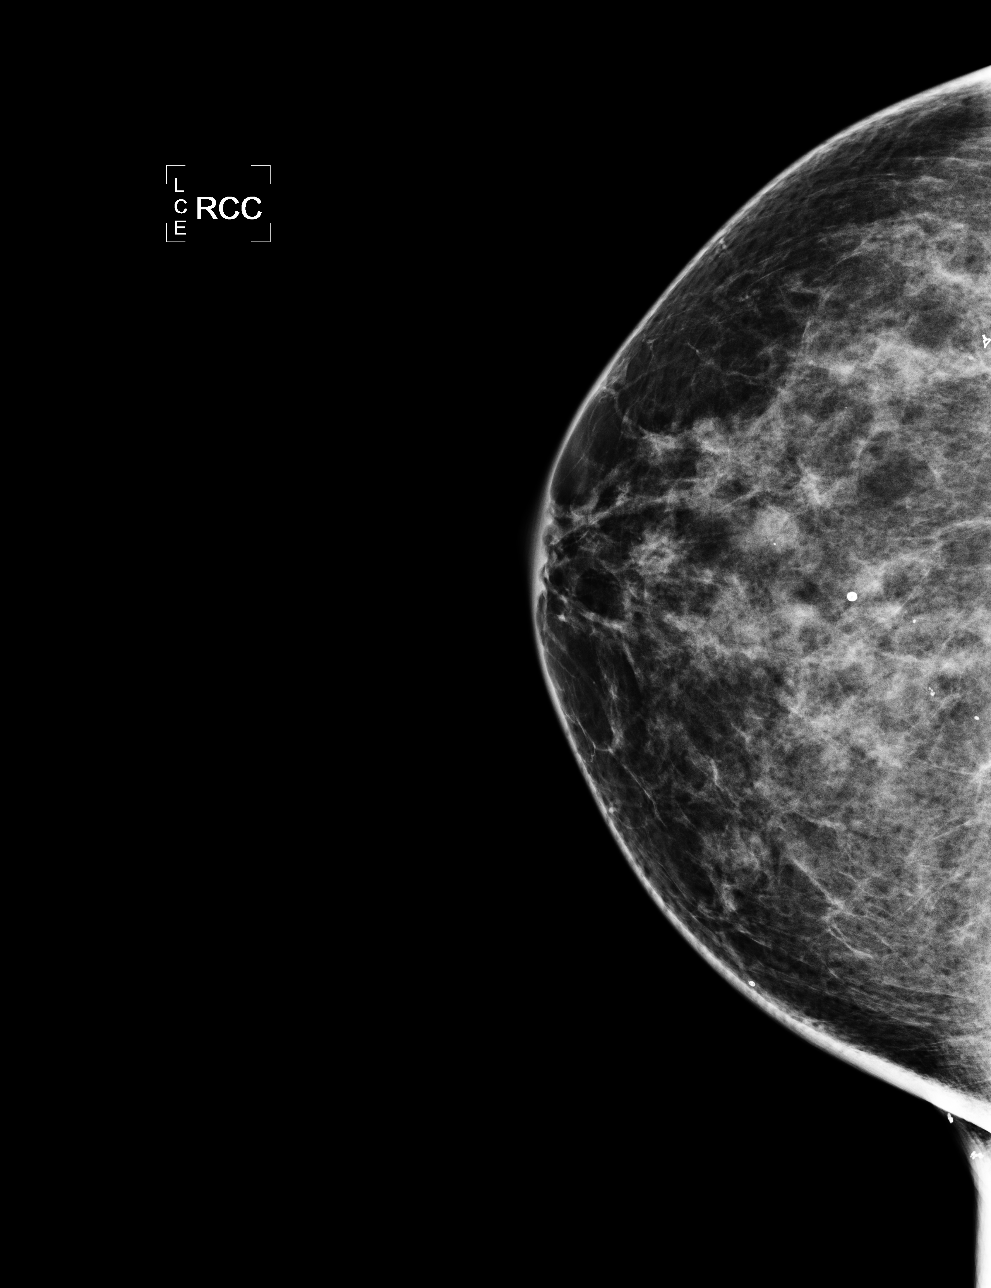

[L CC]
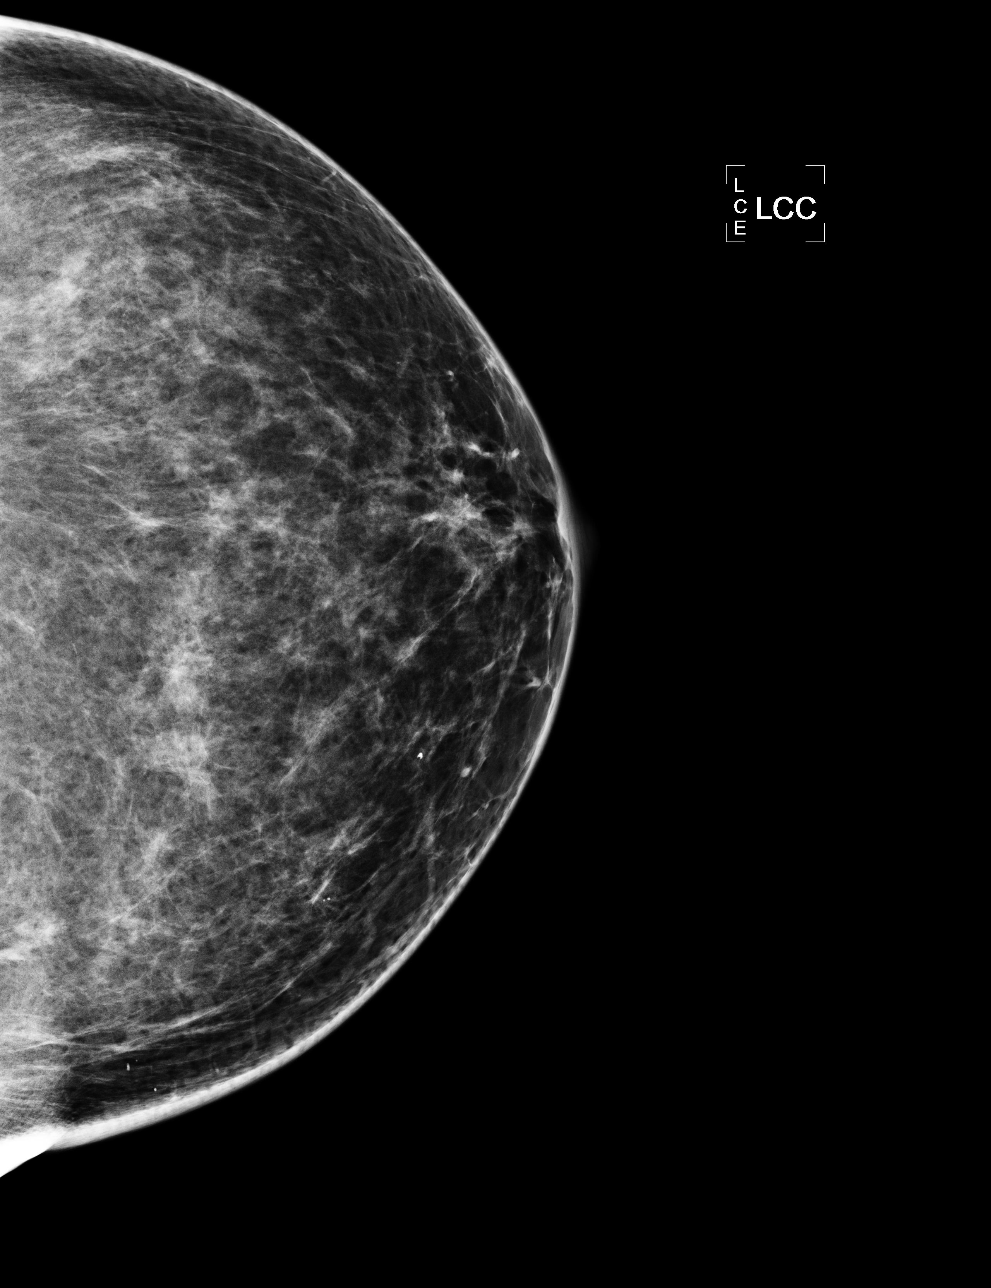

[L MLO]
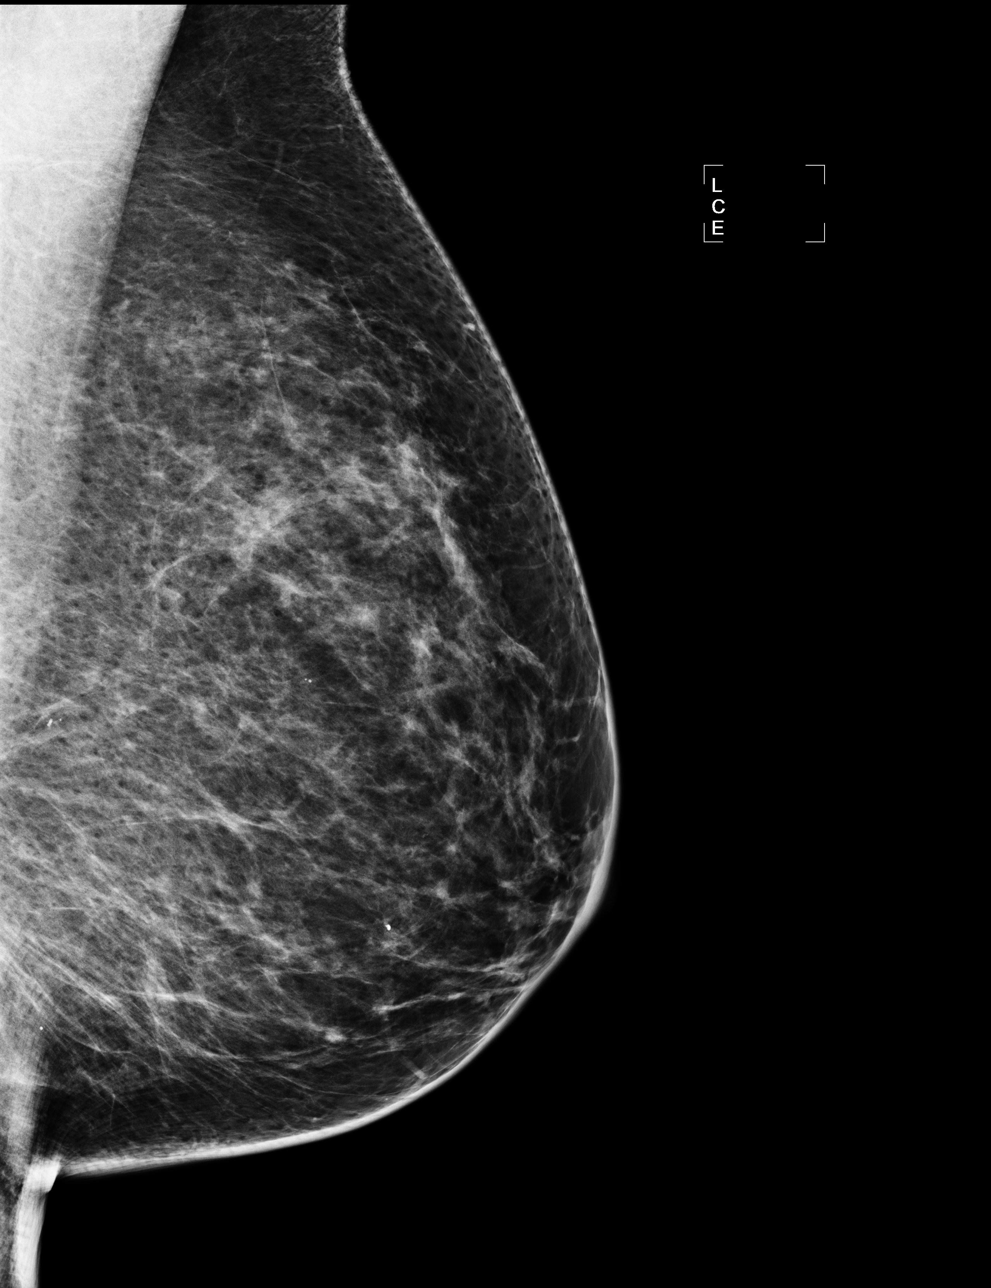

[R MLO]
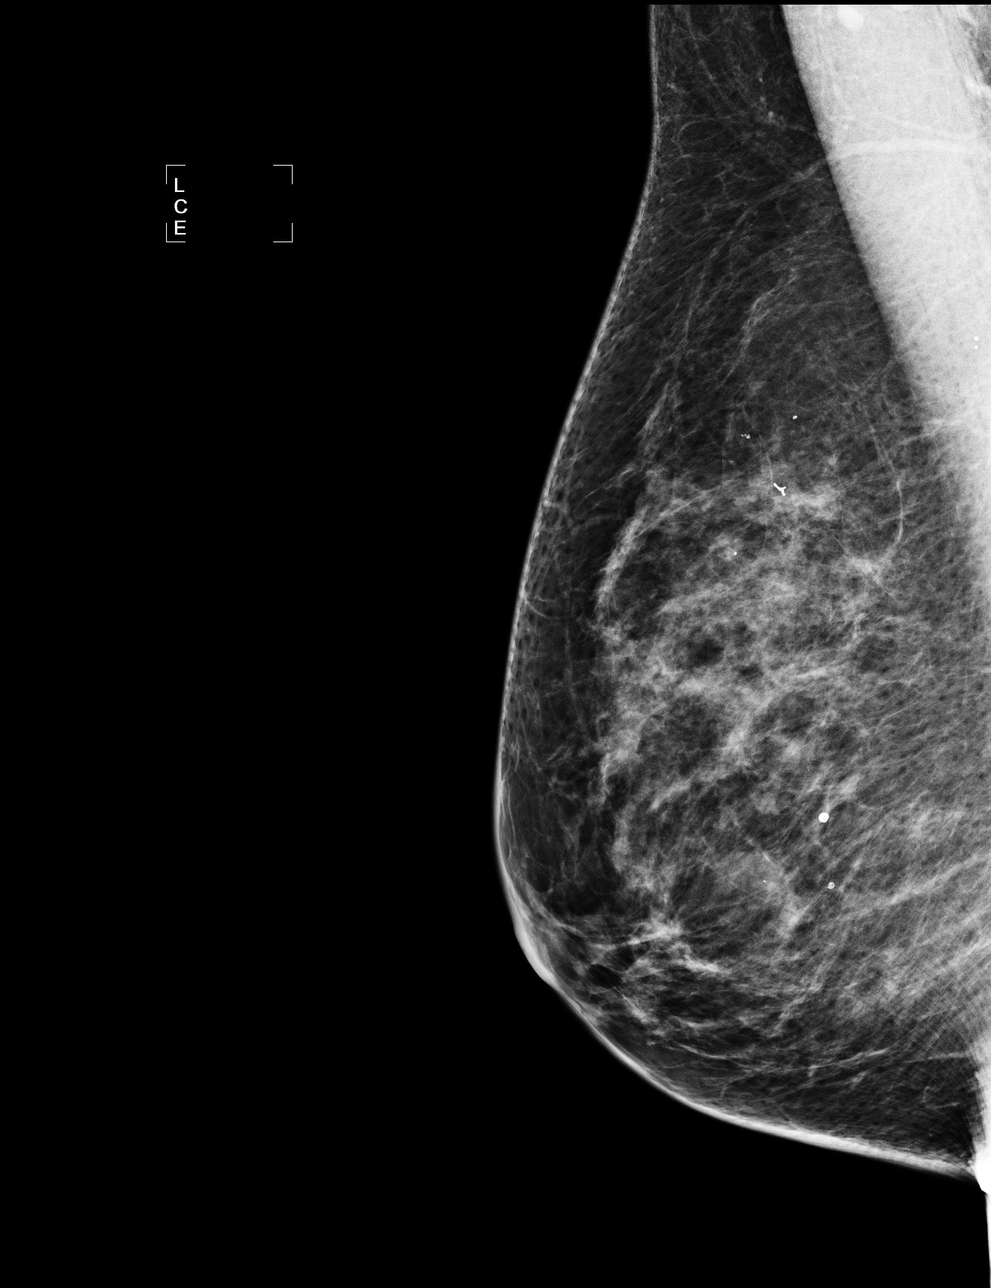

[4 of 4 positions shown; findings below may reference images not displayed]

IMPRESSION: No specific mammographic evidence of malignancy.  Next screening mammogram is recommended in one 
year.

A result letter of this screening mammogram will be mailed directly to the patient.

ASSESSMENT: Negative - BI-RADS 1

Screening mammogram in 1 year.
,

## 2011-10-25 ENCOUNTER — Other Ambulatory Visit: Payer: Self-pay | Admitting: Dermatology

## 2012-02-22 ENCOUNTER — Other Ambulatory Visit: Payer: Self-pay | Admitting: Family Medicine

## 2012-02-22 DIAGNOSIS — Z1231 Encounter for screening mammogram for malignant neoplasm of breast: Secondary | ICD-10-CM

## 2012-03-26 ENCOUNTER — Ambulatory Visit
Admission: RE | Admit: 2012-03-26 | Discharge: 2012-03-26 | Disposition: A | Discharge status: Home / Self Care | Payer: BC Managed Care – PPO | Source: Ambulatory Visit | Attending: Family Medicine | Admitting: Family Medicine

## 2012-03-26 DIAGNOSIS — Z1231 Encounter for screening mammogram for malignant neoplasm of breast: Secondary | ICD-10-CM

## 2012-03-26 IMAGING — MG MM DIGITAL SCREENING BILAT
4 series · 4 of 4 positions shown · non-contrast
Comparison: Previous exams.

CLINICAL DATA: Screening.

DIGITAL BILATERAL SCREENING MAMMOGRAM WITH CAD

[R CC]
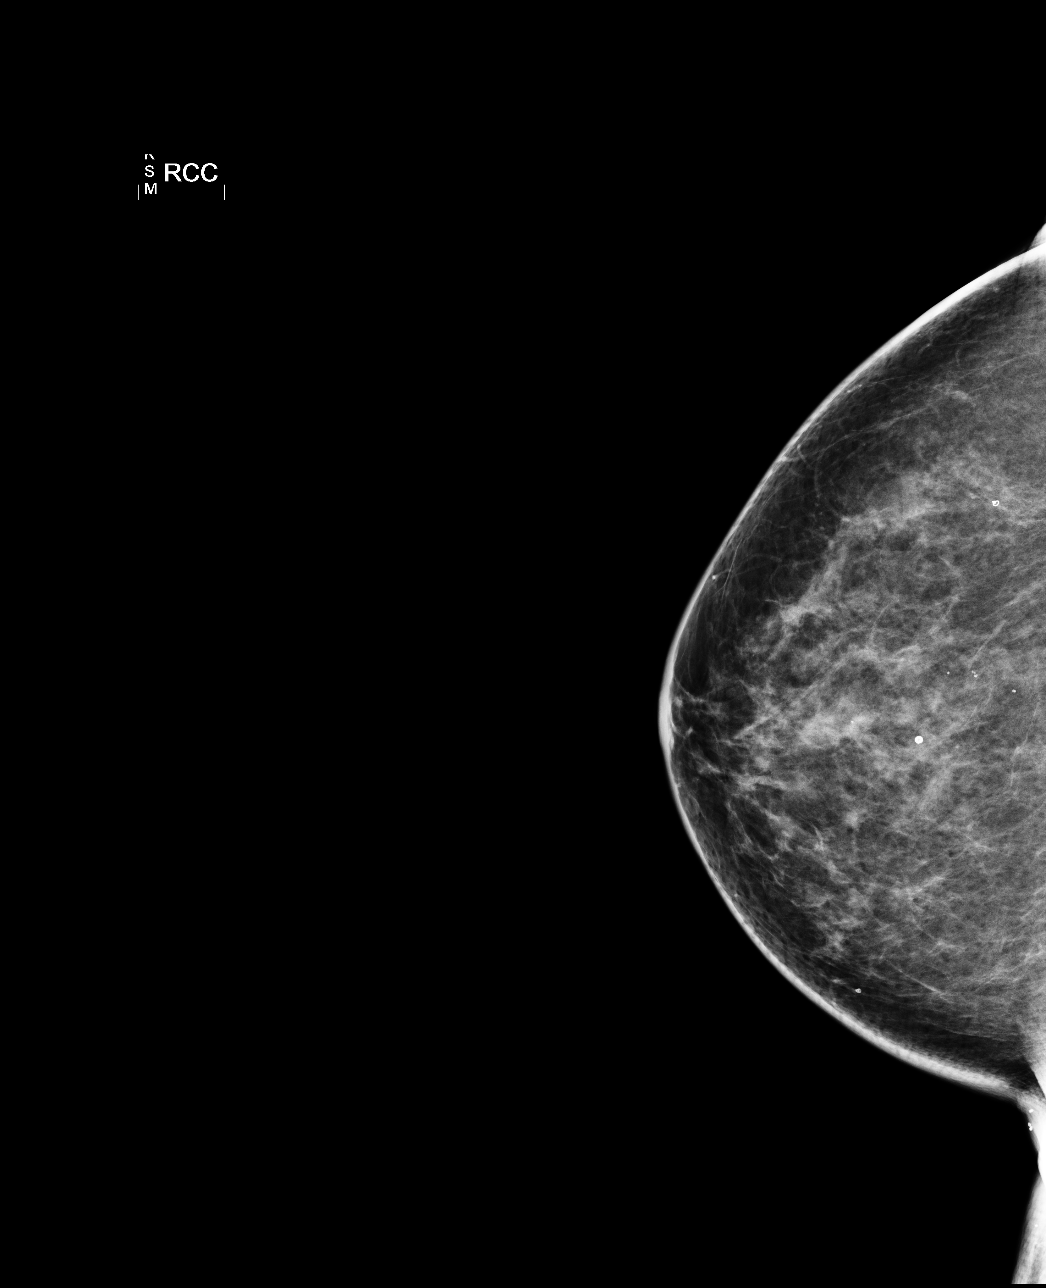

[L CC]
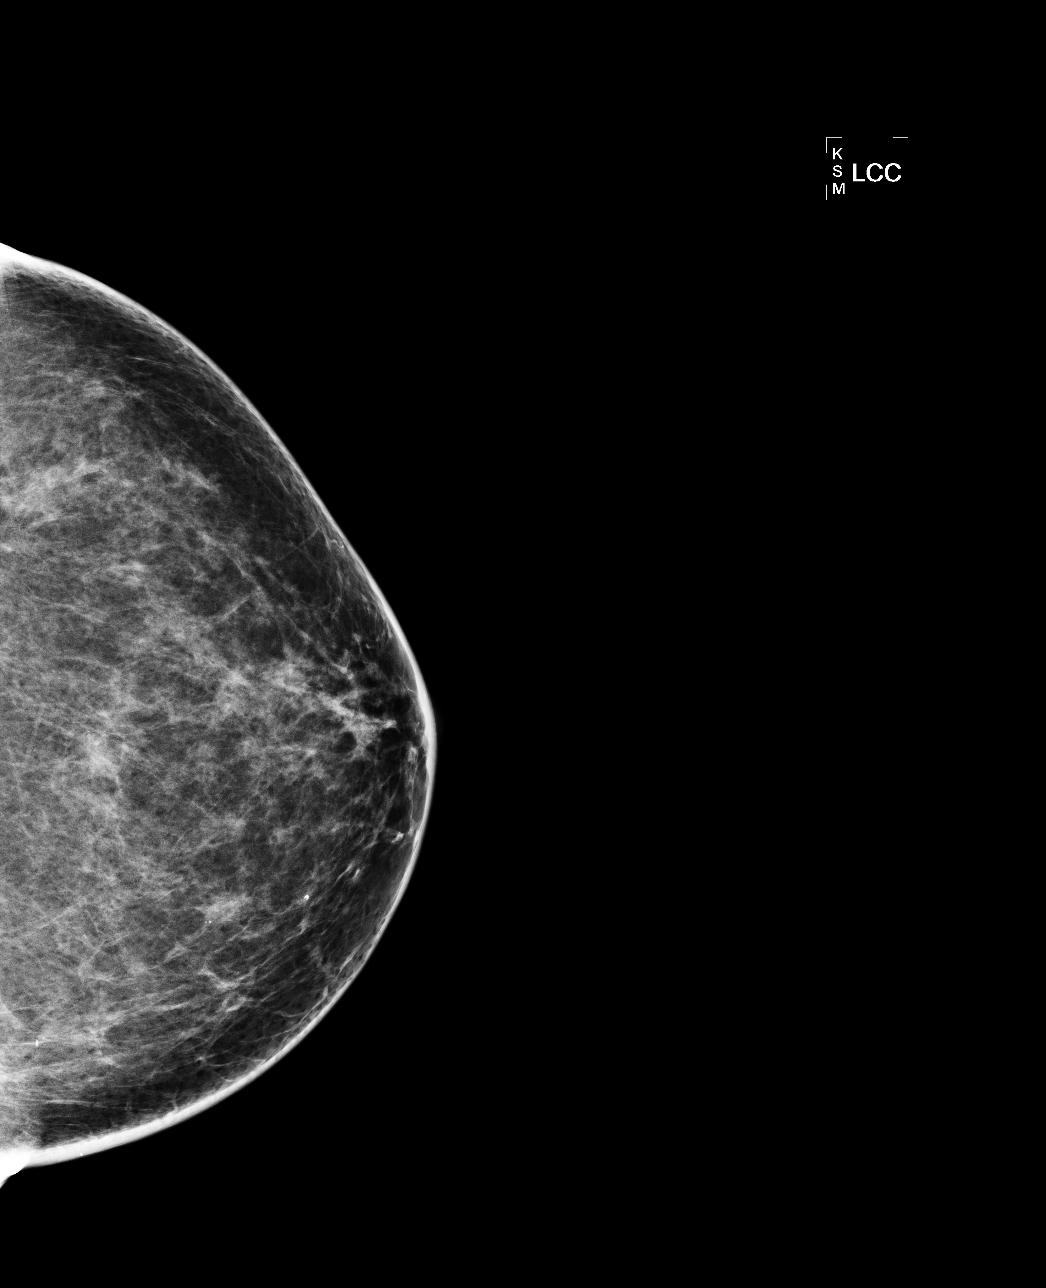

[L MLO]
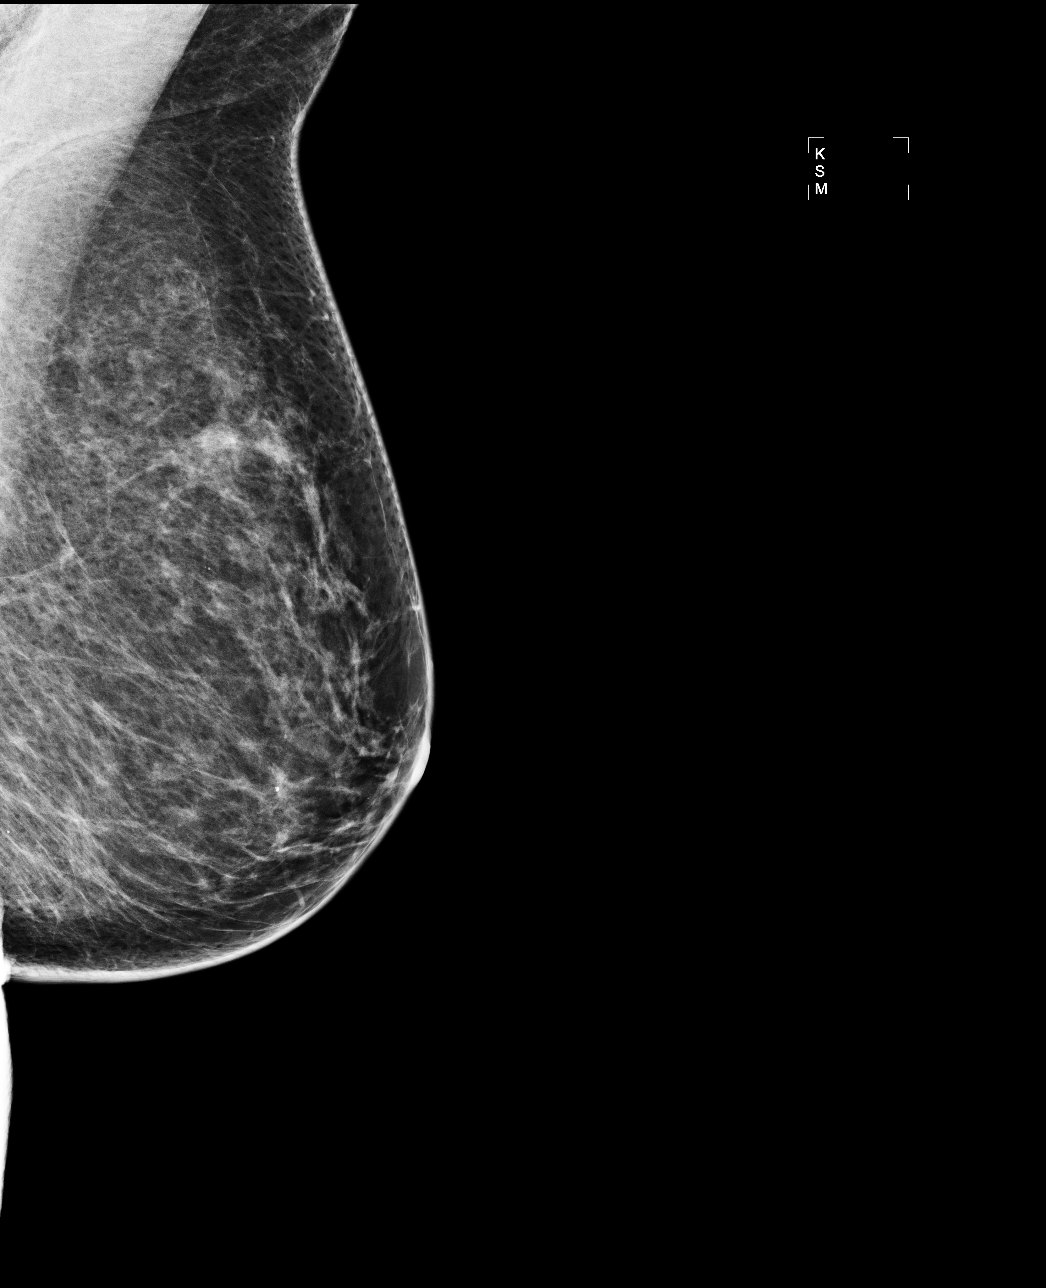

[R MLO]
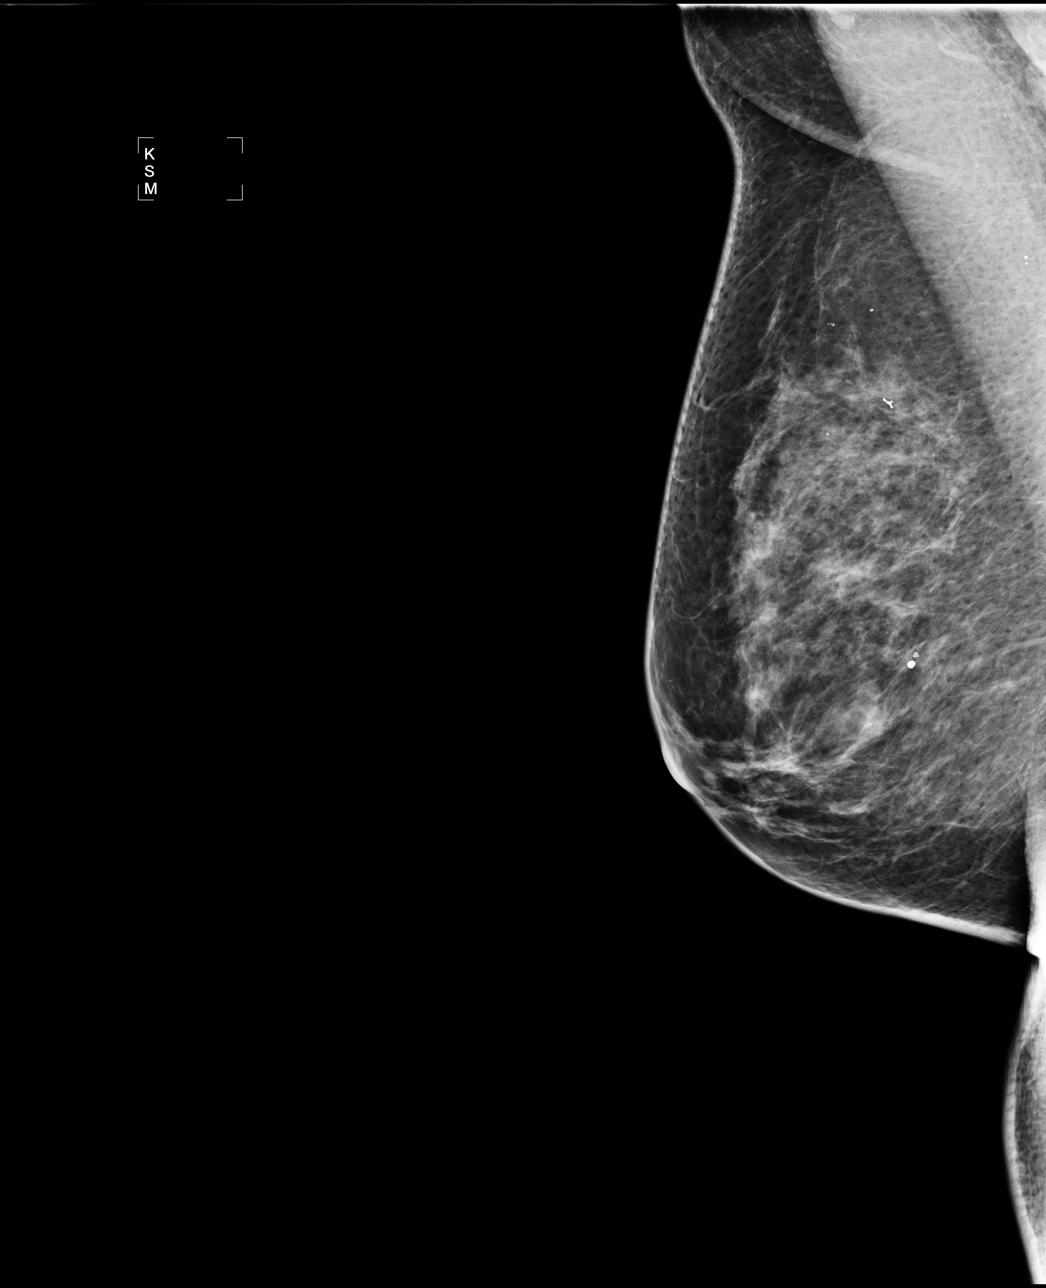

[4 of 4 positions shown; findings below may reference images not displayed]

FINDINGS: Two views of each breast demonstrate heterogeneously
dense tissue.  In the left breast, a possible asymmetry warrants
further evaluation with spot compression views and possibly
ultrasound.  In the right breast, no suspicious masses or malignant
type calcifications are identified.

Images were processed with CAD.
IMPRESSION: Further evaluation is suggested for possible asymmetry in the left
breast.

RECOMMENDATION:
Diagnostic mammogram and possibly ultrasound of the left breast.
(Code:[ER])

BI-RADS CATEGORY 0:  Incomplete.  Need additional imaging
evaluation and/or prior mammograms for comparison.

## 2012-03-28 ENCOUNTER — Ambulatory Visit
Admission: RE | Admit: 2012-03-28 | Discharge: 2012-03-28 | Disposition: A | Discharge status: Home / Self Care | Payer: BC Managed Care – PPO | Source: Ambulatory Visit | Attending: Family Medicine | Admitting: Family Medicine

## 2012-03-28 ENCOUNTER — Other Ambulatory Visit: Payer: Self-pay | Admitting: Family Medicine

## 2012-03-28 DIAGNOSIS — R928 Other abnormal and inconclusive findings on diagnostic imaging of breast: Secondary | ICD-10-CM

## 2012-03-28 IMAGING — MG MM DIGITAL DIAGNOSTIC LIMITED*L*
2 series · 2 of 2 positions shown · non-contrast
Comparison: [DATE], [DATE], [DATE] and [DATE]

CLINICAL DATA: Patient presents for additional views of the left
breast as follow-up to recent screening exam suggesting a possible
mass.

DIGITAL DIAGNOSTIC LEFT MAMMOGRAM THE

[L CC]
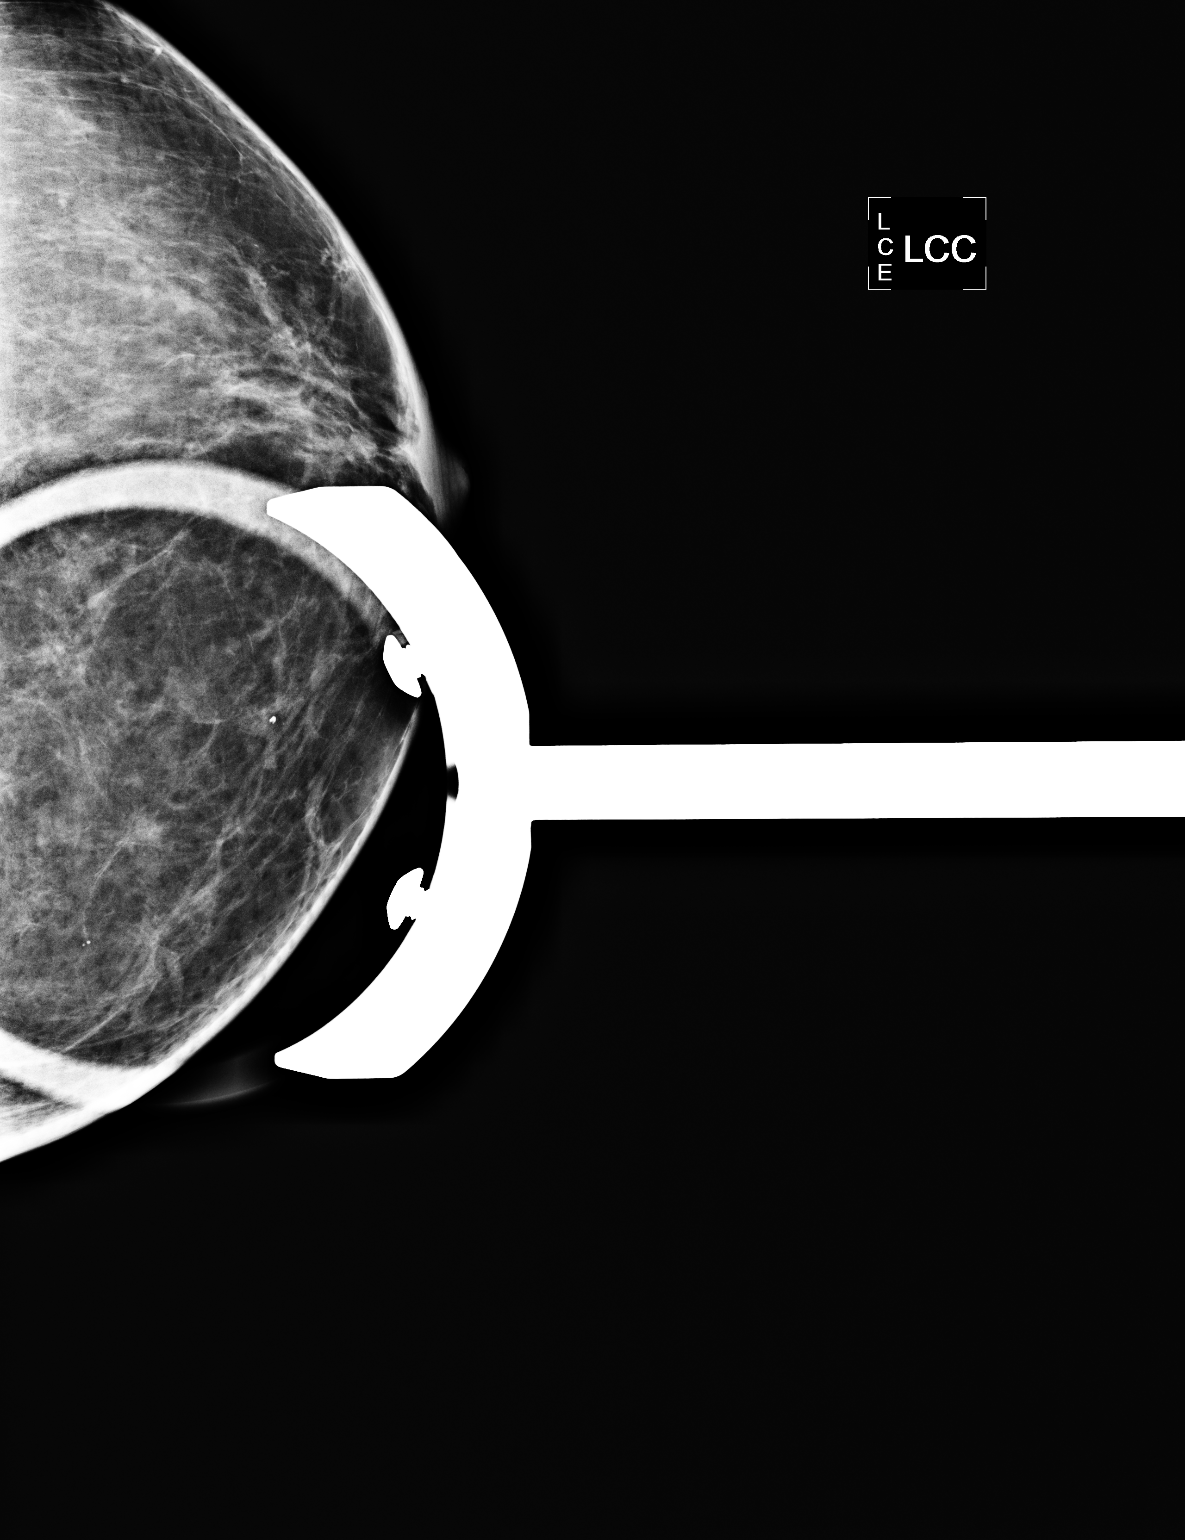

[L ML]
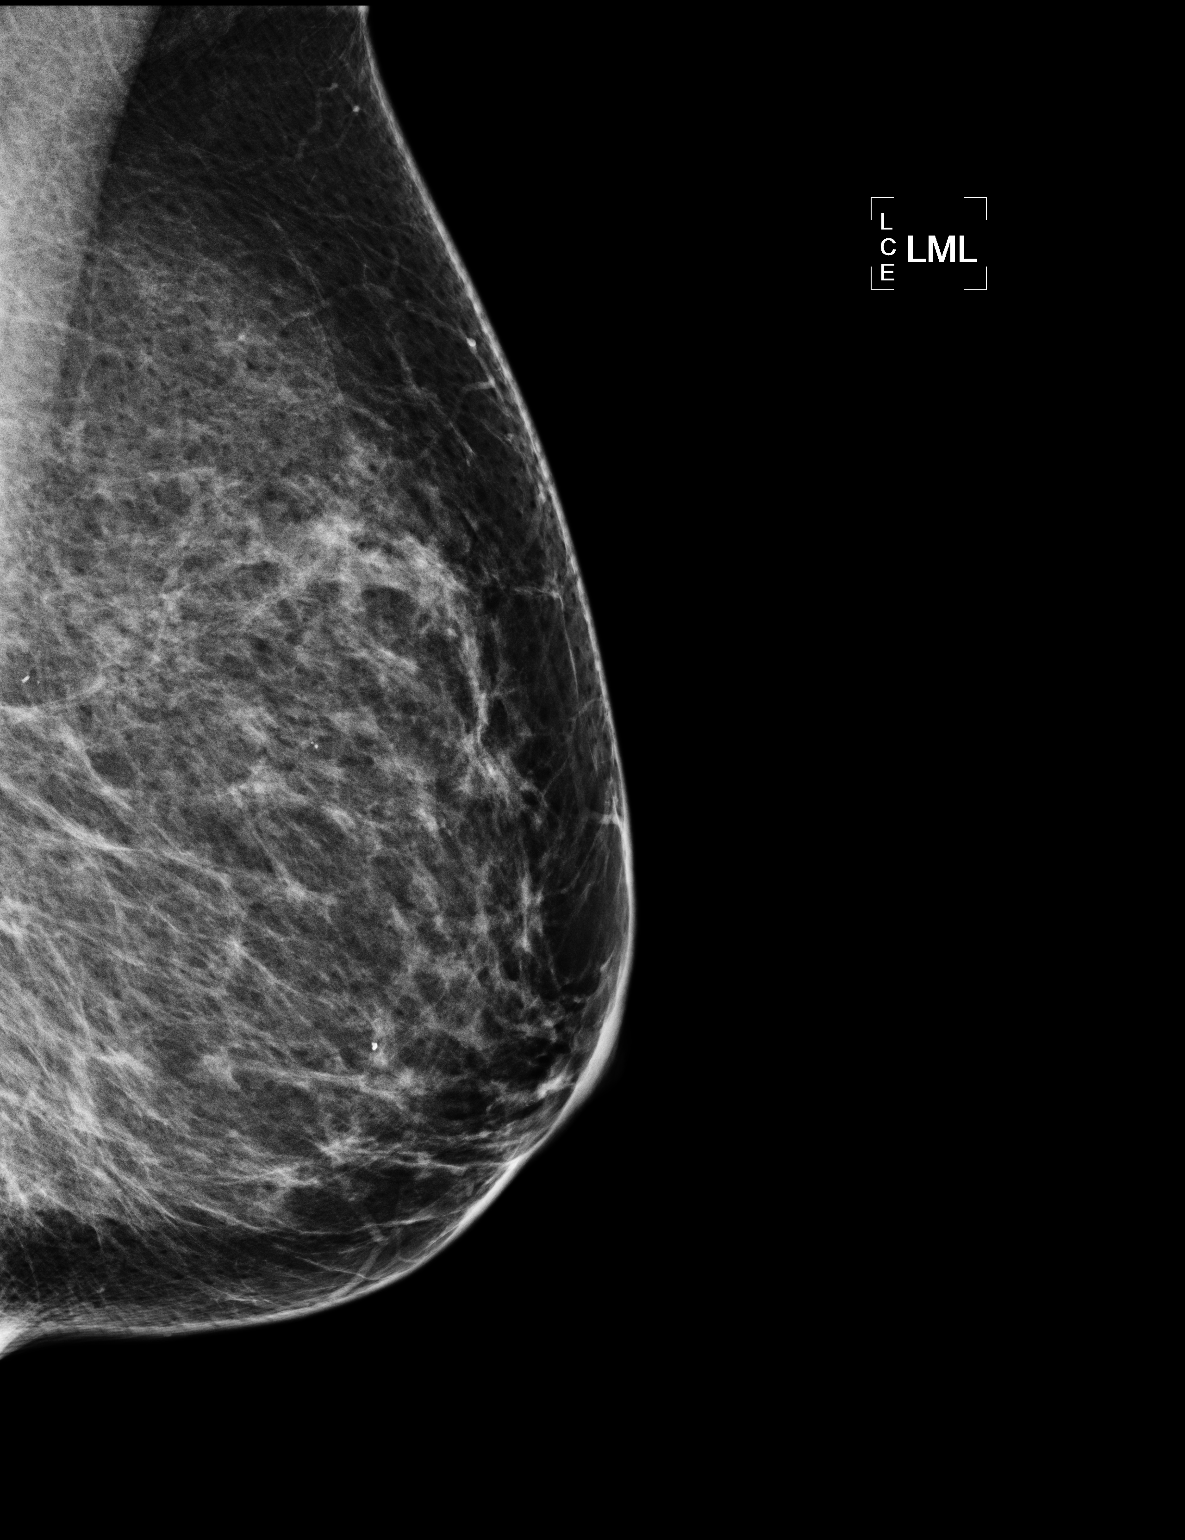

[2 of 2 positions shown; findings below may reference images not displayed]

FINDINGS: Spot compression and true lateral images demonstrate no
focal abnormality of the inner left breast.
IMPRESSION: No focal abnormality of the inner left breast.

RECOMMENDATION:
Recommend continued annual bilateral screening mammographic follow-
up.

BI-RADS CATEGORY 1:  Negative.

## 2013-04-12 ENCOUNTER — Other Ambulatory Visit: Payer: Self-pay

## 2013-04-12 DIAGNOSIS — Z1231 Encounter for screening mammogram for malignant neoplasm of breast: Secondary | ICD-10-CM

## 2013-05-21 ENCOUNTER — Ambulatory Visit
Admission: RE | Admit: 2013-05-21 | Discharge: 2013-05-21 | Disposition: A | Discharge status: Home / Self Care | Payer: BC Managed Care – PPO | Source: Ambulatory Visit

## 2013-05-21 DIAGNOSIS — Z1231 Encounter for screening mammogram for malignant neoplasm of breast: Secondary | ICD-10-CM

## 2013-05-21 IMAGING — MG MM SCREEN MAMMOGRAM BILATERAL
4 series · 4 of 4 positions shown · non-contrast
Comparison: Previous exam(s).

CLINICAL DATA: Screening.

EXAM:
DIGITAL SCREENING BILATERAL MAMMOGRAM WITH CAD

[R CC]
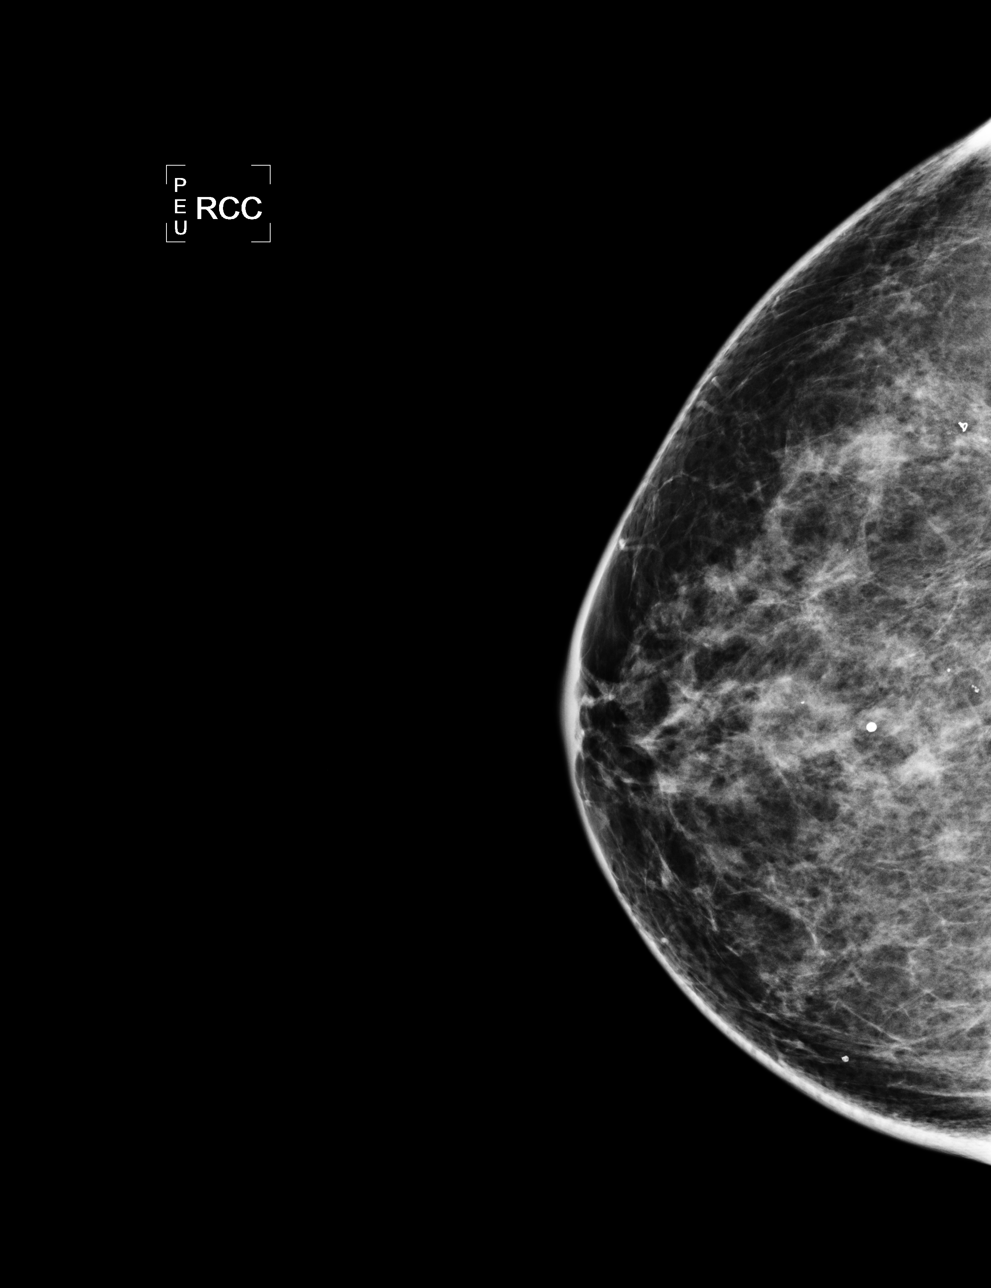

[L CC]
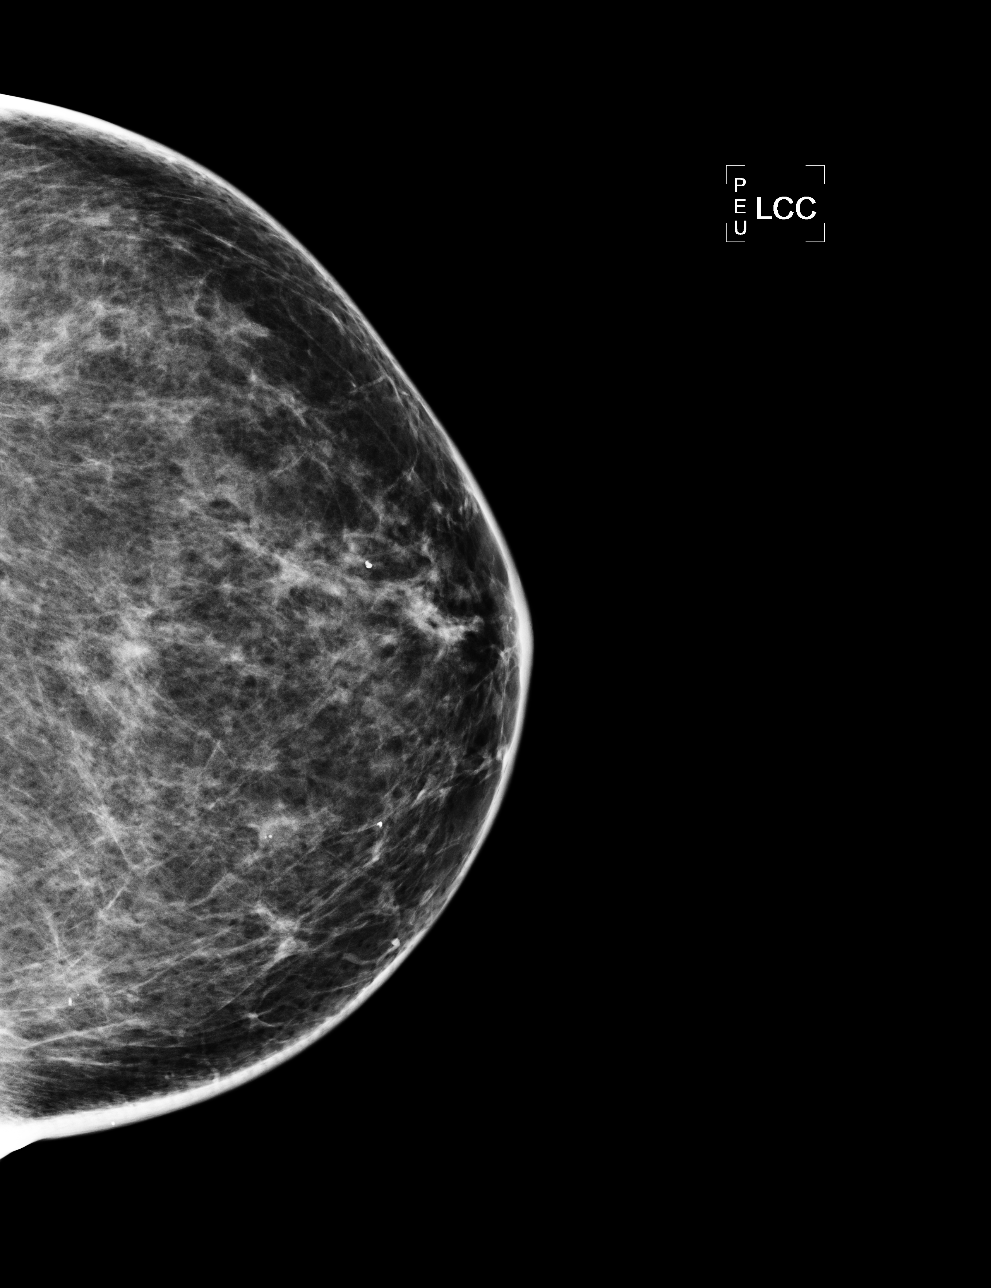

[L MLO]
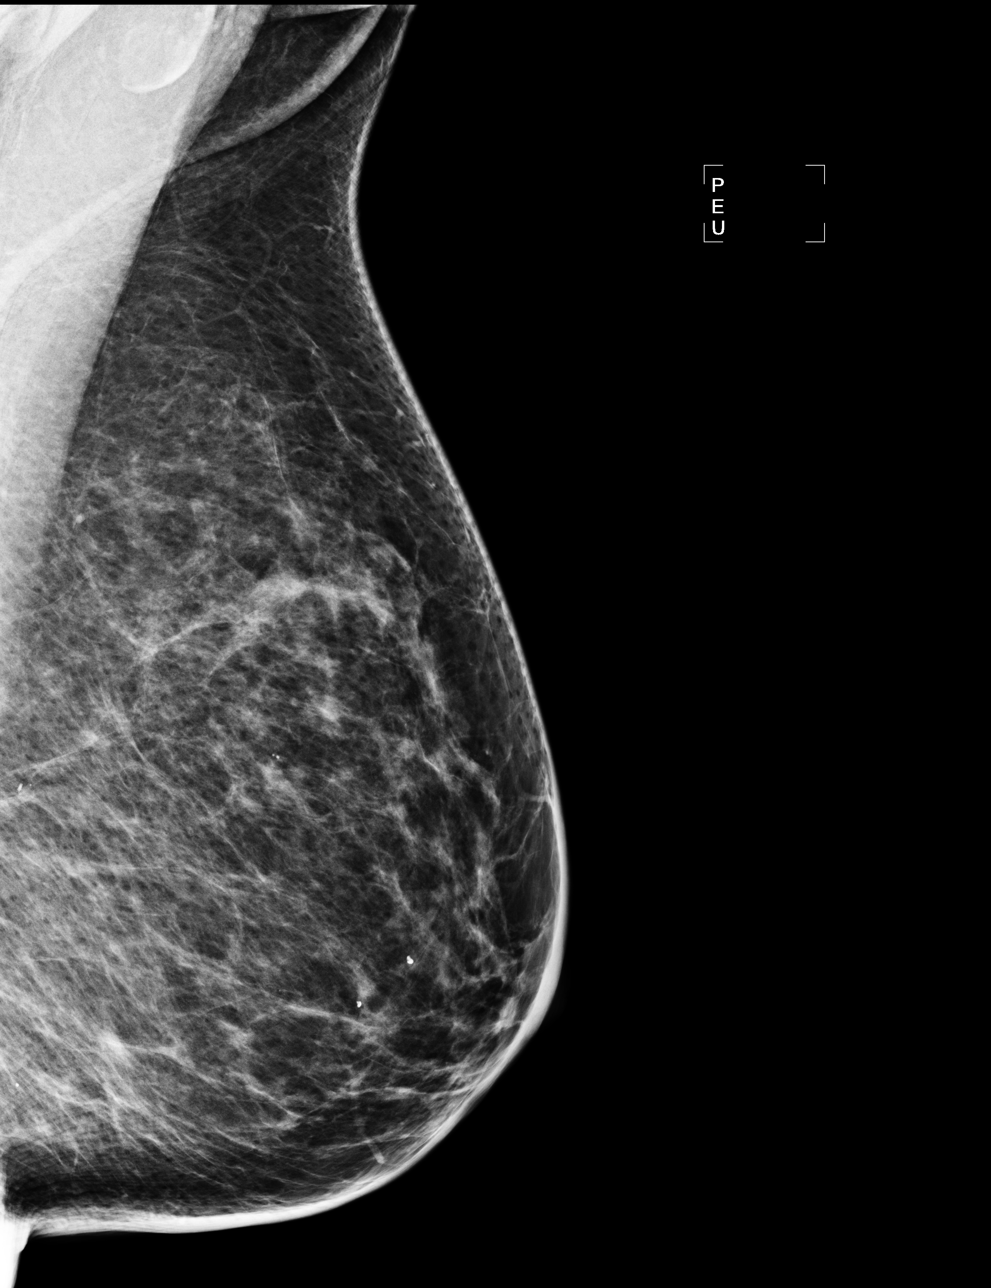

[R MLO]
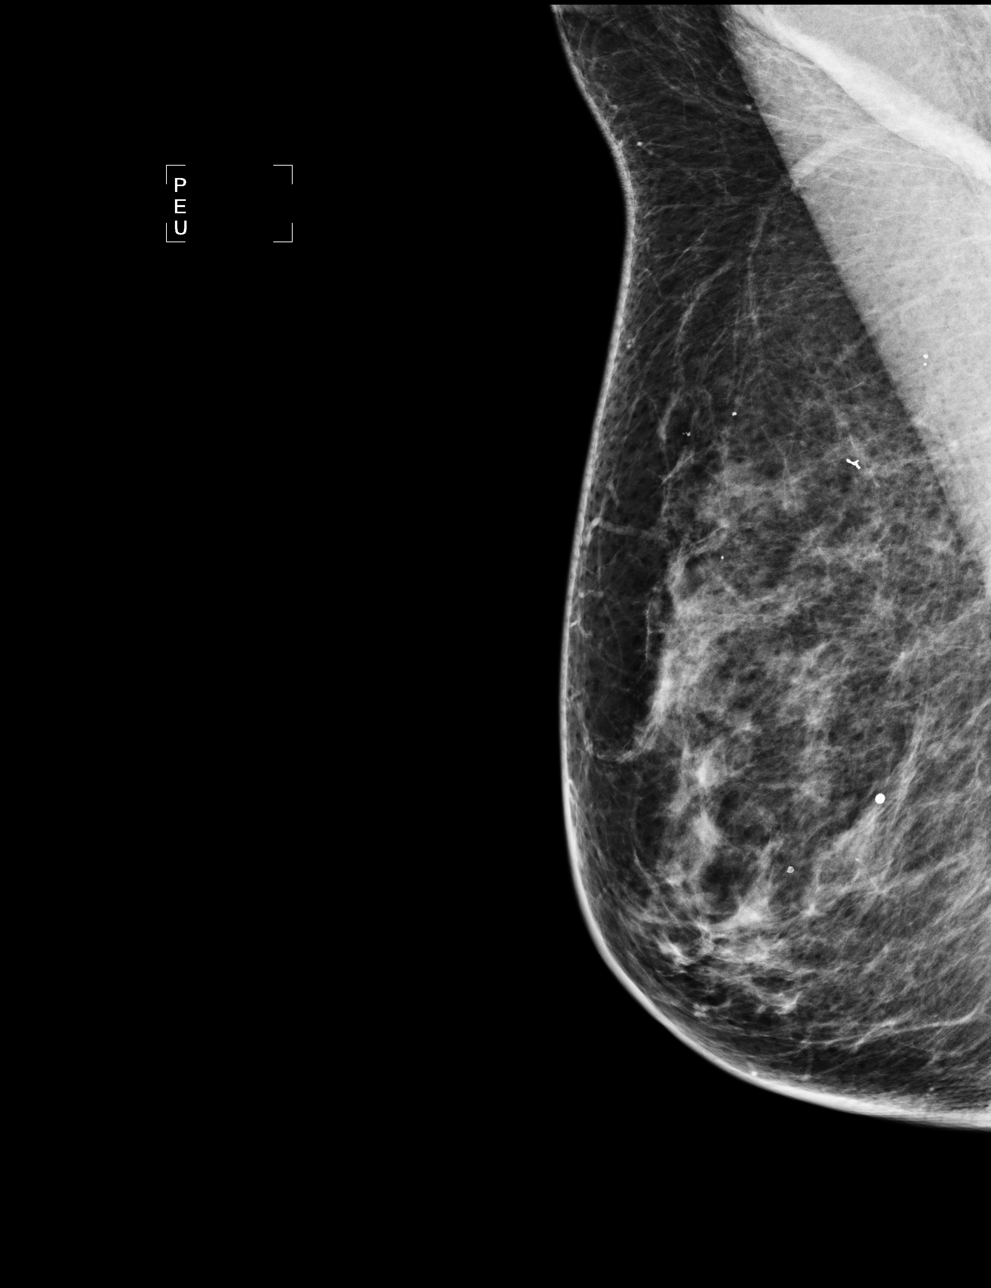

[4 of 4 positions shown; findings below may reference images not displayed]

ACR Breast Density Category b: There are scattered areas of
fibroglandular density.
FINDINGS: There are no findings suspicious for malignancy. Images were
processed with CAD.
IMPRESSION: No mammographic evidence of malignancy. A result letter of this
screening mammogram will be mailed directly to the patient.

RECOMMENDATION:
Screening mammogram in one year. (Code:[HN])

BI-RADS CATEGORY  1: Negative

## 2013-06-08 ENCOUNTER — Emergency Department (HOSPITAL_BASED_OUTPATIENT_CLINIC_OR_DEPARTMENT_OTHER)
Admission: EM | Admit: 2013-06-08 | Discharge: 2013-06-08 | Disposition: A | Discharge status: Home / Self Care | Payer: BC Managed Care – PPO | Attending: Emergency Medicine | Admitting: Emergency Medicine

## 2013-06-08 ENCOUNTER — Encounter (HOSPITAL_BASED_OUTPATIENT_CLINIC_OR_DEPARTMENT_OTHER): Payer: Self-pay | Admitting: Emergency Medicine

## 2013-06-08 DIAGNOSIS — R52 Pain, unspecified: Secondary | ICD-10-CM | POA: Insufficient documentation

## 2013-06-08 HISTORY — DX: Pure hypercholesterolemia, unspecified: E78.00

## 2013-06-08 NOTE — ED Notes (Signed)
Patient here with dizziness, generalized bodyaches, headache since mid morning. No distress on arrival. Alert and oriented

## 2020-04-21 ENCOUNTER — Telehealth: Payer: Self-pay | Admitting: Emergency Medicine

## 2020-04-21 NOTE — Telephone Encounter (Signed)
Records have been received and are in the file cabinet with consult papers which will be able to be given to RB day of pt's appt 04/28/20.  Attempted to call pt to let her know that records have been received but unable to reach. Left pt a detailed message letting her know that we had her records. Nothing further needed.

## 2020-04-28 ENCOUNTER — Ambulatory Visit (INDEPENDENT_AMBULATORY_CARE_PROVIDER_SITE_OTHER): Payer: Medicare Other | Admitting: Emergency Medicine

## 2020-04-28 ENCOUNTER — Encounter: Payer: Self-pay | Admitting: Emergency Medicine

## 2020-04-28 ENCOUNTER — Other Ambulatory Visit (INDEPENDENT_AMBULATORY_CARE_PROVIDER_SITE_OTHER): Payer: Medicare Other

## 2020-04-28 ENCOUNTER — Other Ambulatory Visit: Payer: Self-pay

## 2020-04-28 VITALS — BP 128/82 | HR 111 | Temp 97.4°F | Ht 62.0 in | Wt 160.4 lb

## 2020-04-28 DIAGNOSIS — R918 Other nonspecific abnormal finding of lung field: Secondary | ICD-10-CM

## 2020-04-28 DIAGNOSIS — R791 Abnormal coagulation profile: Secondary | ICD-10-CM | POA: Diagnosis not present

## 2020-04-28 LAB — PROTIME-INR
INR: 1.1 ratio — ABNORMAL HIGH (ref 0.8–1.0)
Prothrombin Time: 12.3 s (ref 9.6–13.1)

## 2020-04-28 LAB — COMPREHENSIVE METABOLIC PANEL
ALT: 14 U/L (ref 0–35)
AST: 16 U/L (ref 0–37)
Albumin: 4.3 g/dL (ref 3.5–5.2)
Alkaline Phosphatase: 98 U/L (ref 39–117)
BUN: 11 mg/dL (ref 6–23)
CO2: 28 mEq/L (ref 19–32)
Calcium: 9.4 mg/dL (ref 8.4–10.5)
Chloride: 100 mEq/L (ref 96–112)
Creatinine, Ser: 0.69 mg/dL (ref 0.40–1.20)
GFR: 87.84 mL/min (ref >60.00)
Glucose, Bld: 103 mg/dL — ABNORMAL HIGH (ref 70–99)
Potassium: 3.9 mEq/L (ref 3.5–5.1)
Sodium: 136 mEq/L (ref 135–145)
Total Bilirubin: 0.3 mg/dL (ref 0.2–1.2)
Total Protein: 7.5 g/dL (ref 6.0–8.3)

## 2020-04-28 LAB — CBC WITH DIFFERENTIAL/PLATELET
Basophils Absolute: 0.1 10*3/uL (ref 0.0–0.1)
Basophils Relative: 1.1 % (ref 0.0–3.0)
Eosinophils Absolute: 0.3 10*3/uL (ref 0.0–0.7)
Eosinophils Relative: 4.1 % (ref 0.0–5.0)
HCT: 40.5 % (ref 36.0–46.0)
Hemoglobin: 13.7 g/dL (ref 12.0–15.0)
Lymphocytes Relative: 27.8 % (ref 12.0–46.0)
Lymphs Abs: 1.9 10*3/uL (ref 0.7–4.0)
MCHC: 33.9 g/dL (ref 30.0–36.0)
MCV: 87.5 fl (ref 78.0–100.0)
Monocytes Absolute: 0.5 10*3/uL (ref 0.1–1.0)
Monocytes Relative: 7.1 % (ref 3.0–12.0)
Neutro Abs: 4.2 10*3/uL (ref 1.4–7.7)
Neutrophils Relative %: 59.9 % (ref 43.0–77.0)
Platelets: 331 10*3/uL (ref 150.0–400.0)
RBC: 4.63 Mil/uL (ref 3.87–5.11)
RDW: 13.9 % (ref 11.5–15.5)
WBC: 7 10*3/uL (ref 4.0–10.5)

## 2020-04-28 NOTE — H&P (View-Only) (Signed)
Subjective:    Patient ID: Tami Stone, female    DOB: 1950/06/19, 69 y.o.   MRN: [id number]   HPI 69 yo never smoker with hyperlipidemia, diabetes, hypothyroidism. Referred today for abnormal chest imaging.  She reports that she began to have cough about 3 months ago, minimally productive. No blood. Never fever, chills. Some decreased energy. Tried using dayquil with some benefit. Underwent CXR late October as below, was then treated with Azithro. She may feel a bit better after the azithro, still has cough however.   She doesn't want a covid test.   Chest x-ray done on 04/09/2020 reviewed, showed a possible rounded consolidation in the left lower lobe, pneumonia versus mass with a small associated effusion.  CT chest 04/16/2020 reviewed by me shows a rounded area of consolidation in the left lower lobe 6 x 6 cm with at least a component that includes air bronchograms but then a more solid rounded area with mixed density, question necrosis or even abscess.  There is associated left axillary and anterior left mediastinal lymphadenopathy.   Review of Systems As per HPI  Past Medical History:  Diagnosis Date  . Diabetes ([place])   . High cholesterol      No family history on file.   Social History   Socioeconomic History  . Marital status: Married    Spouse name: Not on file  . Number of children: Not on file  . Years of education: Not on file  . Highest education level: Not on file  Occupational History  . Not on file  Tobacco Use  . Smoking status: Never Smoker  . Smokeless tobacco: Never Used  Substance and Sexual Activity  . Alcohol use: Not on file  . Drug use: Not on file  . Sexual activity: Not on file  Other Topics Concern  . Not on file  Social History Narrative  . Not on file   Social Determinants of Health   Financial Resource Strain:   . Difficulty of Paying Living Expenses: Not on file  Food Insecurity:   . Worried About Charity fundraiser in the  Last Year: Not on file  . Ran Out of Food in the Last Year: Not on file  Transportation Needs:   . Lack of Transportation (Medical): Not on file  . Lack of Transportation (Non-Medical): Not on file  Physical Activity:   . Days of Exercise per Week: Not on file  . Minutes of Exercise per Session: Not on file  Stress:   . Feeling of Stress : Not on file  Social Connections:   . Frequency of Communication with Friends and Family: Not on file  . Frequency of Social Gatherings with Friends and Family: Not on file  . Attends Religious Services: Not on file  . Active Member of Clubs or Organizations: Not on file  . Attends Archivist Meetings: Not on file  . Marital Status: Not on file  Intimate Partner Violence:   . Fear of Current or Ex-Partner: Not on file  . Emotionally Abused: Not on file  . Physically Abused: Not on file  . Sexually Abused: Not on file     No Known Allergies   Outpatient Medications Prior to Visit  Medication Sig Dispense Refill  . metFORMIN (GLUCOPHAGE) 500 MG tablet Take by mouth 2 (two) times daily with a meal.    . NP THYROID 60 MG tablet Take 60 mg by mouth daily.     No facility-administered medications  prior to visit.        Objective:   Physical Exam  Vitals:   04/28/20 1406  BP: 128/82  Pulse: (!) 111  Temp: (!) 97.4 F (36.3 C)  SpO2: 99%  Weight: 160 lb 6.4 oz (72.8 kg)  Height: 5\' 2"  (1.575 m)  'Gen: Pleasant, well-nourished, in no distress,  normal affect  ENT: No lesions,  mouth clear,  oropharynx clear, no postnasal drip  Neck: No JVD, no stridor  Lungs: No use of accessory muscles, no crackles or wheezing on normal respiration, no wheeze on forced expiration  Cardiovascular: RRR, heart sounds normal, no murmur or gallops, no peripheral edema  Musculoskeletal: No deformities, no cyanosis or clubbing  Neuro: alert, awake, non focal  Skin: Warm, no lesions or rash      Assessment & Plan:  Mass of lower lobe of  left lung Certainly has characteristics concerning for primary lung mass.  There are some air bronchograms in adjacent area that could represent postobstructive change.  Options include antibiotics and watchful waiting to see if there is resolution or improvement.  In absence of clear pneumonia symptoms leading up to this CT I am more concerned that this is a mass and I have recommended biopsy upfront.  One barrier is that she does not want to be Covid tested which will be necessary if we are going to plan the procedure.  She will speak with her husband and think about what she wants to do next.  There is an opacity in your left lower lobe with very concerning for primary lung mass.  I recommended that we perform a bronchoscopy to achieve a biopsy. You need to let Dr. Lamonte Sakai know if you want to proceed with a bronchoscopy and biopsies. Please stop your lumbrokinase for now.  We can talk about restarting it post procedure. Follow with Dr Lamonte Sakai in 1 month   Baltazar Apo, MD, PhD 04/28/2020, 9:18 PM [facility] [phone] or if no answer [phone]

## 2020-04-28 NOTE — Assessment & Plan Note (Signed)
Certainly has characteristics concerning for primary lung mass.  There are some air bronchograms in adjacent area that could represent postobstructive change.  Options include antibiotics and watchful waiting to see if there is resolution or improvement.  In absence of clear pneumonia symptoms leading up to this CT I am more concerned that this is a mass and I have recommended biopsy upfront.  One barrier is that she does not want to be Covid tested which will be necessary if we are going to plan the procedure.  She will speak with her husband and think about what she wants to do next.  There is an opacity in your left lower lobe with very concerning for primary lung mass.  I recommended that we perform a bronchoscopy to achieve a biopsy. You need to let Dr. Lamonte Sakai know if you want to proceed with a bronchoscopy and biopsies. Please stop your lumbrokinase for now.  We can talk about restarting it post procedure. Follow with Dr Lamonte Sakai in 1 month

## 2020-04-28 NOTE — Patient Instructions (Signed)
There is an opacity in your left lower lobe with very concerning for primary lung mass.  I recommended that we perform a bronchoscopy to achieve a biopsy. You need to let Dr. Lamonte Sakai know if you want to proceed with a bronchoscopy and biopsies. Please stop your lumbrokinase for now.  We can talk about restarting it post procedure. Follow with Dr Lamonte Sakai in 1 month

## 2020-04-28 NOTE — Progress Notes (Signed)
Subjective:    Patient ID: Tami Stone, female    DOB: 09/02/1950, 69 y.o.   MRN: [id number]   HPI 69 yo never smoker with hyperlipidemia, diabetes, hypothyroidism. Referred today for abnormal chest imaging.  She reports that she began to have cough about 3 months ago, minimally productive. No blood. Never fever, chills. Some decreased energy. Tried using dayquil with some benefit. Underwent CXR late October as below, was then treated with Azithro. She may feel a bit better after the azithro, still has cough however.   She doesn't want a covid test.   Chest x-ray done on 04/09/2020 reviewed, showed a possible rounded consolidation in the left lower lobe, pneumonia versus mass with a small associated effusion.  CT chest 04/16/2020 reviewed by me shows a rounded area of consolidation in the left lower lobe 6 x 6 cm with at least a component that includes air bronchograms but then a more solid rounded area with mixed density, question necrosis or even abscess.  There is associated left axillary and anterior left mediastinal lymphadenopathy.   Review of Systems As per HPI  Past Medical History:  Diagnosis Date  . Diabetes ([place])   . High cholesterol      No family history on file.   Social History   Socioeconomic History  . Marital status: Married    Spouse name: Not on file  . Number of children: Not on file  . Years of education: Not on file  . Highest education level: Not on file  Occupational History  . Not on file  Tobacco Use  . Smoking status: Never Smoker  . Smokeless tobacco: Never Used  Substance and Sexual Activity  . Alcohol use: Not on file  . Drug use: Not on file  . Sexual activity: Not on file  Other Topics Concern  . Not on file  Social History Narrative  . Not on file   Social Determinants of Health   Financial Resource Strain:   . Difficulty of Paying Living Expenses: Not on file  Food Insecurity:   . Worried About Charity fundraiser in the  Last Year: Not on file  . Ran Out of Food in the Last Year: Not on file  Transportation Needs:   . Lack of Transportation (Medical): Not on file  . Lack of Transportation (Non-Medical): Not on file  Physical Activity:   . Days of Exercise per Week: Not on file  . Minutes of Exercise per Session: Not on file  Stress:   . Feeling of Stress : Not on file  Social Connections:   . Frequency of Communication with Friends and Family: Not on file  . Frequency of Social Gatherings with Friends and Family: Not on file  . Attends Religious Services: Not on file  . Active Member of Clubs or Organizations: Not on file  . Attends Archivist Meetings: Not on file  . Marital Status: Not on file  Intimate Partner Violence:   . Fear of Current or Ex-Partner: Not on file  . Emotionally Abused: Not on file  . Physically Abused: Not on file  . Sexually Abused: Not on file     No Known Allergies   Outpatient Medications Prior to Visit  Medication Sig Dispense Refill  . metFORMIN (GLUCOPHAGE) 500 MG tablet Take by mouth 2 (two) times daily with a meal.    . NP THYROID 60 MG tablet Take 60 mg by mouth daily.     No facility-administered medications  prior to visit.        Objective:   Physical Exam  Vitals:   04/28/20 1406  BP: 128/82  Pulse: (!) 111  Temp: (!) 97.4 F (36.3 C)  SpO2: 99%  Weight: 160 lb 6.4 oz (72.8 kg)  Height: 5\' 2"  (1.575 m)  'Gen: Pleasant, well-nourished, in no distress,  normal affect  ENT: No lesions,  mouth clear,  oropharynx clear, no postnasal drip  Neck: No JVD, no stridor  Lungs: No use of accessory muscles, no crackles or wheezing on normal respiration, no wheeze on forced expiration  Cardiovascular: RRR, heart sounds normal, no murmur or gallops, no peripheral edema  Musculoskeletal: No deformities, no cyanosis or clubbing  Neuro: alert, awake, non focal  Skin: Warm, no lesions or rash      Assessment & Plan:  Mass of lower lobe of  left lung Certainly has characteristics concerning for primary lung mass.  There are some air bronchograms in adjacent area that could represent postobstructive change.  Options include antibiotics and watchful waiting to see if there is resolution or improvement.  In absence of clear pneumonia symptoms leading up to this CT I am more concerned that this is a mass and I have recommended biopsy upfront.  One barrier is that she does not want to be Covid tested which will be necessary if we are going to plan the procedure.  She will speak with her husband and think about what she wants to do next.  There is an opacity in your left lower lobe with very concerning for primary lung mass.  I recommended that we perform a bronchoscopy to achieve a biopsy. You need to let Dr. Lamonte Sakai know if you want to proceed with a bronchoscopy and biopsies. Please stop your lumbrokinase for now.  We can talk about restarting it post procedure. Follow with Dr Lamonte Sakai in 1 month   Baltazar Apo, MD, PhD 04/28/2020, 9:18 PM [facility] [phone] or if no answer [phone]

## 2020-04-29 ENCOUNTER — Encounter (HOSPITAL_COMMUNITY): Payer: Self-pay | Admitting: Emergency Medicine

## 2020-04-29 ENCOUNTER — Other Ambulatory Visit (HOSPITAL_COMMUNITY)
Admission: RE | Admit: 2020-04-29 | Discharge: 2020-04-29 | Disposition: A | Discharge status: Home / Self Care | Payer: Medicare Other | Source: Ambulatory Visit | Attending: Emergency Medicine | Admitting: Emergency Medicine

## 2020-04-29 ENCOUNTER — Telehealth: Payer: Self-pay | Admitting: Emergency Medicine

## 2020-04-29 DIAGNOSIS — Z01812 Encounter for preprocedural laboratory examination: Secondary | ICD-10-CM | POA: Diagnosis present

## 2020-04-29 DIAGNOSIS — Z20822 Contact with and (suspected) exposure to covid-19: Secondary | ICD-10-CM | POA: Insufficient documentation

## 2020-04-29 LAB — SARS CORONAVIRUS 2 (TAT 6-24 HRS): SARS Coronavirus 2: NEGATIVE

## 2020-04-29 NOTE — Progress Notes (Signed)
PCP:   Unknown Cardiologist:  denies  EKG:  N/A CXR:  04/09/20 in Care Everywhere ECHO:  Denies Stress Test:  Denies Cardiac Cath:  Denies  Fasting Blood Sugar-  Does not check BG Checks Blood Sugar__0_ times a day  OSA/CPAP:  NO  ASA/Blood Thinner:  No  Covid test 11/17 pending  Anesthesia Review;  NO  Patient denies shortness of breath, fever, cough, and chest pain at PAT appointment.  Patient verbalized understanding of instructions provided today at the PAT appointment.  Patient asked to review instructions at home and day of surgery.

## 2020-04-29 NOTE — Telephone Encounter (Signed)
Called and spoke with patient. Let them know their Bronch is scheduled for 04/30/20 with Dr. Lamonte Sakai at [place].  Patient was instructed to arrive at hospital at 7. They were instructed to bring someone with them as they will not be able to drive home from procedure. Patient instructed not to have anything to eat or drink after midnight. Patient has held blood thinner since 04/28/20.  Patient voiced understanding, nothing further needed  Routing to RB as FYI

## 2020-04-30 ENCOUNTER — Ambulatory Visit (HOSPITAL_COMMUNITY): Payer: Medicare Other

## 2020-04-30 ENCOUNTER — Ambulatory Visit (HOSPITAL_COMMUNITY): Payer: Medicare Other | Admitting: Anesthesiology

## 2020-04-30 ENCOUNTER — Encounter (HOSPITAL_COMMUNITY): Payer: Self-pay | Admitting: Emergency Medicine

## 2020-04-30 ENCOUNTER — Ambulatory Visit (HOSPITAL_COMMUNITY)
Admission: RE | Admit: 2020-04-30 | Discharge: 2020-04-30 | Disposition: A | Discharge status: Home / Self Care | Payer: Medicare Other | Attending: Emergency Medicine | Admitting: Emergency Medicine

## 2020-04-30 ENCOUNTER — Encounter (HOSPITAL_COMMUNITY)
Admission: RE | Disposition: A | Discharge status: Home / Self Care | Payer: Self-pay | Source: Home / Self Care | Attending: Emergency Medicine

## 2020-04-30 ENCOUNTER — Other Ambulatory Visit: Payer: Self-pay

## 2020-04-30 DIAGNOSIS — Z7984 Long term (current) use of oral hypoglycemic drugs: Secondary | ICD-10-CM | POA: Diagnosis not present

## 2020-04-30 DIAGNOSIS — R918 Other nonspecific abnormal finding of lung field: Secondary | ICD-10-CM | POA: Diagnosis not present

## 2020-04-30 DIAGNOSIS — Z79890 Hormone replacement therapy (postmenopausal): Secondary | ICD-10-CM | POA: Insufficient documentation

## 2020-04-30 DIAGNOSIS — C3432 Malignant neoplasm of lower lobe, left bronchus or lung: Secondary | ICD-10-CM | POA: Diagnosis not present

## 2020-04-30 DIAGNOSIS — Z9889 Other specified postprocedural states: Secondary | ICD-10-CM

## 2020-04-30 HISTORY — PX: VIDEO BRONCHOSCOPY: SHX5072

## 2020-04-30 HISTORY — PX: HEMOSTASIS CONTROL: SHX6838

## 2020-04-30 HISTORY — PX: BRONCHIAL BIOPSY: SHX5109

## 2020-04-30 HISTORY — PX: BRONCHIAL BRUSHINGS: SHX5108

## 2020-04-30 LAB — GLUCOSE, CAPILLARY
Glucose-Capillary: 115 mg/dL — ABNORMAL HIGH (ref 70–99)
Glucose-Capillary: 116 mg/dL — ABNORMAL HIGH (ref 70–99)

## 2020-04-30 IMAGING — CR DG CHEST 1V PORT
1 series · 1 of 1 positions shown · non-contrast
Comparison: None.

CLINICAL DATA: Left-sided lung biopsy.  Coughing.

EXAM:
PORTABLE CHEST 1 VIEW

[AP]
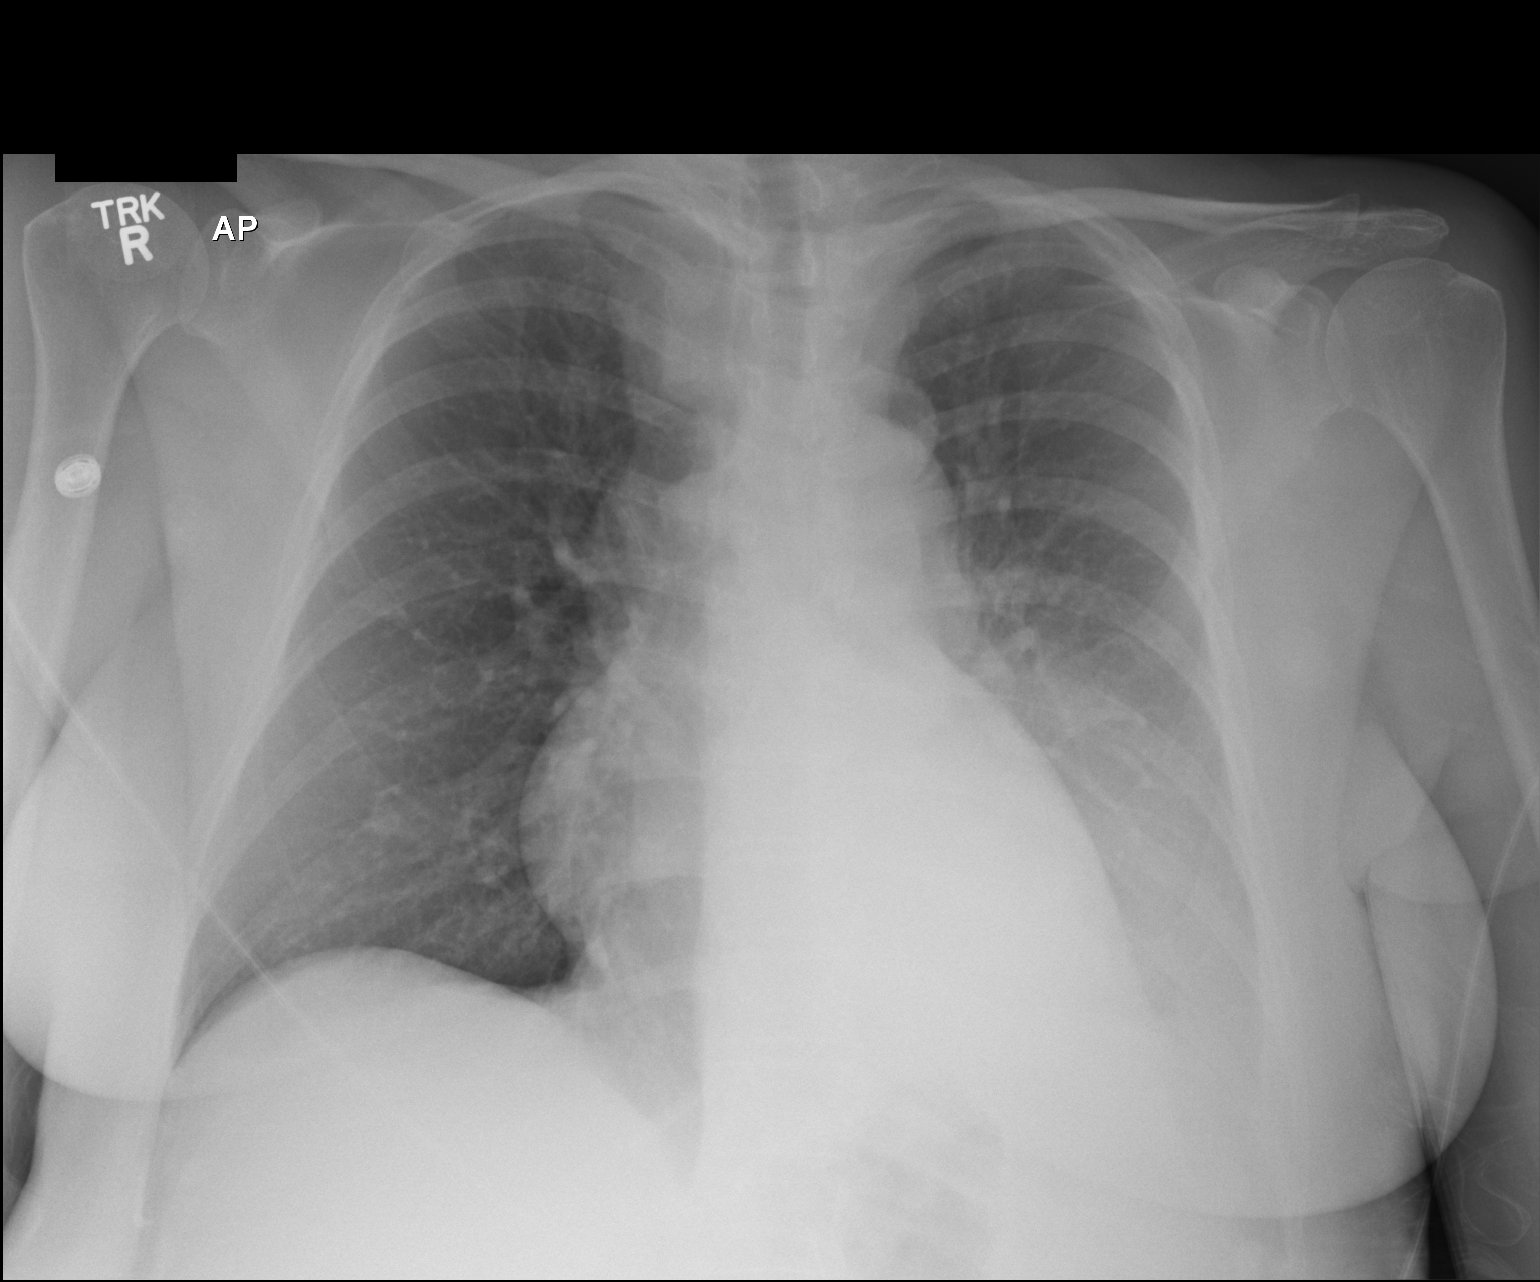

[1 of 1 positions shown; findings below may reference images not displayed]

FINDINGS: Left base collapse/consolidation with probable left effusion. No
evidence for pneumothorax. Right lung clear. Cardiopericardial
silhouette is at upper limits of normal for size. The visualized
bony structures of the thorax show no acute abnormality.
IMPRESSION: Left base retrocardiac collapse/consolidation with probable left
pleural effusion. No pneumothorax.

## 2020-04-30 SURGERY — BRONCHOSCOPY, WITH FLUOROSCOPY
Anesthesia: General

## 2020-04-30 MED ORDER — CHLORHEXIDINE GLUCONATE 0.12 % MT SOLN: 15.0000 mL | Freq: Once | OROMUCOSAL | Status: AC

## 2020-04-30 MED ORDER — LIDOCAINE 2% (20 MG/ML) 5 ML SYRINGE
INTRAMUSCULAR | Status: DC | PRN
  Administered 2020-04-30: 100 mg via INTRAVENOUS

## 2020-04-30 MED ORDER — FENTANYL CITRATE (PF) 100 MCG/2ML IJ SOLN
INTRAMUSCULAR | Status: DC | PRN
  Administered 2020-04-30: 50 ug via INTRAVENOUS

## 2020-04-30 MED ORDER — PHENYLEPHRINE 40 MCG/ML (10ML) SYRINGE FOR IV PUSH (FOR BLOOD PRESSURE SUPPORT)
PREFILLED_SYRINGE | INTRAVENOUS | Status: DC | PRN
  Administered 2020-04-30 (×5): 80 ug via INTRAVENOUS

## 2020-04-30 MED ORDER — ONDANSETRON HCL 4 MG/2ML IJ SOLN
INTRAMUSCULAR | Status: DC | PRN
  Administered 2020-04-30: 4 mg via INTRAVENOUS

## 2020-04-30 MED ORDER — LACTATED RINGERS IV SOLN: INTRAVENOUS | Status: DC

## 2020-04-30 MED ORDER — MIDAZOLAM HCL 5 MG/5ML IJ SOLN
INTRAMUSCULAR | Status: DC | PRN
  Administered 2020-04-30: 2 mg via INTRAVENOUS

## 2020-04-30 MED ORDER — DEXAMETHASONE SODIUM PHOSPHATE 4 MG/ML IJ SOLN
INTRAMUSCULAR | Status: DC | PRN
  Administered 2020-04-30: 4 mg via INTRAVENOUS

## 2020-04-30 MED ORDER — PHENYLEPHRINE HCL-NACL 10-0.9 MG/250ML-% IV SOLN
INTRAVENOUS | Status: DC | PRN
  Administered 2020-04-30: 75 ug/min via INTRAVENOUS

## 2020-04-30 MED ORDER — SUGAMMADEX SODIUM 200 MG/2ML IV SOLN
INTRAVENOUS | Status: DC | PRN
  Administered 2020-04-30: 200 mg via INTRAVENOUS

## 2020-04-30 MED ORDER — ROCURONIUM BROMIDE 10 MG/ML (PF) SYRINGE
PREFILLED_SYRINGE | INTRAVENOUS | Status: DC | PRN
  Administered 2020-04-30: 10 mg via INTRAVENOUS
  Administered 2020-04-30: 50 mg via INTRAVENOUS

## 2020-04-30 MED ORDER — PROPOFOL 10 MG/ML IV BOLUS
INTRAVENOUS | Status: DC | PRN
  Administered 2020-04-30: 120 mg via INTRAVENOUS

## 2020-04-30 MED ORDER — CHLORHEXIDINE GLUCONATE 0.12 % MT SOLN
OROMUCOSAL | Status: AC
  Administered 2020-04-30: 15 mL via OROMUCOSAL
  Filled 2020-04-30: qty 15

## 2020-04-30 MED ORDER — SODIUM CHLORIDE 0.9 % IV SOLN: INTRAVENOUS | Status: DC | PRN

## 2020-04-30 NOTE — Op Note (Signed)
Video Bronchoscopy Procedure Note  Date of Operation: 04/30/2020  Pre-op Diagnosis: Left lower lobe mass  Post-op Diagnosis: Same  Surgeon: Baltazar Apo  Assistants: none  Anesthesia: General anesthesia  Operation: Flexible video fiberoptic bronchoscopy, brushings and biopsies.  Estimated Blood Loss: Approximately 850 cc  Complications: none noted  Indications and History: Tami Stone is 70 y.o. with history of hypothyroidism, hyperlipidemia.  Never smoker.  Failed to have a left lower lobe rounded opacity versus mass on chest x-ray 04/09/2020 and then subsequently confirmed on CT chest 04/16/2020.  Recommendation was to perform video fiberoptic bronchoscopy with biopsies. The risks, benefits, complications, treatment options and expected outcomes were discussed with the patient.  The possibilities of pneumothorax, pneumonia, reaction to medication, pulmonary aspiration, perforation of a viscus, bleeding, failure to diagnose a condition and creating a complication requiring transfusion or operation were discussed with the patient who freely signed the consent.    Description of Procedure: The patient was seen in the Preoperative Area, was examined and was deemed appropriate to proceed.  The patient was taken to [facility] endoscopy room 2, identified as Tami Stone and the procedure verified as Flexible Video Fiberoptic Bronchoscopy.  A Time Out was held and the above information confirmed.   General anesthesia was initiated the patient was orally intubated.  The video fiberoptic bronchoscope was introduced via the ET tube and a general inspection was performed which showed normal trachea, normal main carina. The R sided airways were inspected and showed normal RUL, BI, RML and RLL.  There was some slight irregularity of the lateral mucosa of the proximal left lower lobe airway just distal to the takeoff of the superior segment.  This area was evaluated with endobronchial  brushings and endobronchial biopsies.  Under fluoroscopic guidance transbronchial brushings were performed in multiple airways of the left lower lobe.  This included the superior segment.  Finally under fluoroscopic guidance transbronchial biopsies were performed in the superior segment as well.  These were sent for pathology.  There was moderate oozing following both the endobronchial and transbronchial sampling with good hemostasis by the end of the case.  There were no obvious complications.  Postprocedural chest x-ray is ordered and pending.  Samples: 1. Endobronchial brushings from left lower lobe bronchus 2.  Transbronchial brushings extending from multiple left lower lobe airways 3.  Transbronchial biopsies from the left lower lobe superior segment  Plans:  We will review the cytology, pathology results with the patient when they become available.  Outpatient followup will be with Dr Lamonte Sakai.    Baltazar Apo, MD, PhD 04/30/2020, 10:20 AM  [facility] [phone] or if no answer [phone]

## 2020-04-30 NOTE — Anesthesia Postprocedure Evaluation (Signed)
Anesthesia Post Note  Patient: Anaria Dossett  Procedure(s) Performed: VIDEO BRONCHOSCOPY WITH FLUORO (N/A ) BRONCHIAL BIOPSIES BRONCHIAL BRUSHINGS HEMOSTASIS CONTROL     Patient location during evaluation: Endoscopy Anesthesia Type: General Level of consciousness: awake and alert Pain management: pain level controlled Vital Signs Assessment: post-procedure vital signs reviewed and stable Respiratory status: spontaneous breathing, nonlabored ventilation, respiratory function stable and patient connected to nasal cannula oxygen Cardiovascular status: blood pressure returned to baseline and stable Postop Assessment: no apparent nausea or vomiting Anesthetic complications: no   No complications documented.  Last Vitals:  Vitals:   04/30/20 1047 04/30/20 1055  BP: (!) 152/86 (!) 141/86  Pulse: 98 95  Resp: (!) 25 (!) 23  Temp:    SpO2: 95% 93%    Last Pain:  Vitals:   04/30/20 1055  TempSrc:   PainSc: 0-No pain                 Barnet Glasgow

## 2020-04-30 NOTE — Transfer of Care (Signed)
Immediate Anesthesia Transfer of Care Note  Patient: Tami Stone  Procedure(s) Performed: VIDEO BRONCHOSCOPY WITH FLUORO (N/A ) BRONCHIAL BIOPSIES BRONCHIAL BRUSHINGS HEMOSTASIS CONTROL  Patient Location: Endoscopy Unit  Anesthesia Type:General  Level of Consciousness: oriented, sedated and patient cooperative  Airway & Oxygen Therapy: Patient Spontanous Breathing and Patient connected to nasal cannula oxygen  Post-op Assessment: Report given to RN and Post -op Vital signs reviewed and stable  Post vital signs: Reviewed  Last Vitals:  Vitals Value Taken Time  BP 148/93 04/30/20 1027  Temp    Pulse 92 04/30/20 1030  Resp 14 04/30/20 1030  SpO2 97 % 04/30/20 1030  Vitals shown include unvalidated device data.  Last Pain:  Vitals:   04/30/20 1027  TempSrc:   PainSc: 0-No pain         Complications: No complications documented.

## 2020-04-30 NOTE — Anesthesia Procedure Notes (Signed)
Procedure Name: Intubation Date/Time: 04/30/2020 8:49 AM Performed by: Aricela Bertagnolli T, CRNA Pre-anesthesia Checklist: Patient identified, Emergency Drugs available, Suction available and Patient being monitored Patient Re-evaluated:Patient Re-evaluated prior to induction Oxygen Delivery Method: Circle system utilized Preoxygenation: Pre-oxygenation with 100% oxygen Induction Type: IV induction Ventilation: Mask ventilation without difficulty Laryngoscope Size: Miller and 3 Grade View: Grade I Tube type: Oral Tube size: 8.5 mm Number of attempts: 1 Airway Equipment and Method: Stylet and Oral airway Placement Confirmation: ETT inserted through vocal cords under direct vision,  positive ETCO2 and breath sounds checked- equal and bilateral Secured at: 22 cm Tube secured with: Tape Dental Injury: Teeth and Oropharynx as per pre-operative assessment

## 2020-04-30 NOTE — Anesthesia Preprocedure Evaluation (Signed)
Anesthesia Evaluation  Patient identified by MRN, date of birth, ID band Patient awake    Reviewed: Allergy & Precautions, NPO status , Patient's Chart, lab work & pertinent test results  Airway Mallampati: II  TM Distance: >3 FB Neck ROM: Full    Dental no notable dental hx. (+) Dental Advisory Given, Upper Dentures   Pulmonary neg pulmonary ROS,  Pt w Mass on lung    Pulmonary exam normal breath sounds clear to auscultation       Cardiovascular Exercise Tolerance: Good negative cardio ROS Normal cardiovascular exam Rhythm:Regular Rate:Normal     Neuro/Psych negative neurological ROS  negative psych ROS   GI/Hepatic negative GI ROS, Neg liver ROS,   Endo/Other  diabetes, Oral Hypoglycemic Agents  Renal/GU K+ 3.9 Cr 0.69     Musculoskeletal   Abdominal   Peds  Hematology negative hematology ROS (+) hgb 13.7   Anesthesia Other Findings   Reproductive/Obstetrics                             Anesthesia Physical Anesthesia Plan  ASA: II  Anesthesia Plan: General   Post-op Pain Management:    Induction: Intravenous  PONV Risk Score and Plan: 3 and Treatment may vary due to age or medical condition, Ondansetron and Midazolam  Airway Management Planned: Oral ETT  Additional Equipment:   Intra-op Plan:   Post-operative Plan: Extubation in OR  Informed Consent: I have reviewed the patients History and Physical, chart, labs and discussed the procedure including the risks, benefits and alternatives for the proposed anesthesia with the patient or authorized representative who has indicated his/her understanding and acceptance.     Dental advisory given  Plan Discussed with: CRNA and Anesthesiologist  Anesthesia Plan Comments:         Anesthesia Quick Evaluation

## 2020-04-30 NOTE — Discharge Instructions (Signed)
Flexible Bronchoscopy, Care After This sheet gives you information about how to care for yourself after your test. Your doctor may also give you more specific instructions. If you have problems or questions, contact your doctor. Follow these instructions at home: Eating and drinking  Do not eat or drink anything (not even water) for 2 hours after your test, or until your numbing medicine (local anesthetic) wears off.  When your numbness is gone and your cough and gag reflexes have come back, you may: ? Eat only soft foods. ? Slowly drink liquids.  The day after the test, go back to your normal diet. Driving  Do not drive for 24 hours if you were given a medicine to help you relax (sedative).  Do not drive or use heavy machinery while taking prescription pain medicine. General instructions   Take over-the-counter and prescription medicines only as told by your doctor.  Return to your normal activities as told. Ask what activities are safe for you.  Do not use any products that have nicotine or tobacco in them. This includes cigarettes and e-cigarettes. If you need help quitting, ask your doctor.  Keep all follow-up visits as told by your doctor. This is important. It is very important if you had a tissue sample (biopsy) taken. Get help right away if:  You have shortness of breath that gets worse.  You get light-headed.  You feel like you are going to pass out (faint).  You have chest pain.  You cough up: ? More than a little blood. ? More blood than before. Summary  Do not eat or drink anything (not even water) for 2 hours after your test, or until your numbing medicine wears off.  Do not use cigarettes. Do not use e-cigarettes.  Get help right away if you have chest pain.   Please call our office for any questions or concerns. [phone].   You can restart your lumbrokinase supplement on 05/04/2020.   This information is not intended to replace advice given to  you by your health care provider. Make sure you discuss any questions you have with your health care provider. Document Revised: 05/12/2017 Document Reviewed: 06/17/2016 Elsevier Patient Education  2020 [employer].

## 2020-04-30 NOTE — Interval H&P Note (Signed)
History and Physical Interval Note:  04/30/2020 7:17 AM  Tami Stone  has presented today for surgery, with the diagnosis of LLL mass.  The various methods of treatment have been discussed with the patient and family. After consideration of risks, benefits and other options for treatment, the patient has consented to  Procedure(s): VIDEO BRONCHOSCOPY WITH FLUORO (N/A) as a surgical intervention.  The patient's history has been reviewed, patient examined, no change in status, stable for surgery.  I have reviewed the patient's chart and labs.  Questions were answered to the patient's satisfaction.     Collene Gobble

## 2020-05-01 ENCOUNTER — Telehealth: Payer: Self-pay | Admitting: Emergency Medicine

## 2020-05-01 DIAGNOSIS — C3492 Malignant neoplasm of unspecified part of left bronchus or lung: Secondary | ICD-10-CM

## 2020-05-01 LAB — CYTOLOGY - NON PAP

## 2020-05-01 NOTE — Telephone Encounter (Signed)
Standing still pendingSpoke with patient by phone discussed biopsy results with her, shows non-small cell lung cancer, further subtyping.  Have recommended referral to [facility] and she agrees.  Will send a referral today.

## 2020-05-04 LAB — SURGICAL PATHOLOGY

## 2020-05-05 ENCOUNTER — Encounter: Payer: Self-pay | Admitting: *Deleted

## 2020-05-05 NOTE — Progress Notes (Signed)
I received referral on Tami Stone.  I am unable to see her scans and I reached out to Dr. Lamonte Sakai for an update. Wait to hear back.

## 2020-05-06 ENCOUNTER — Telehealth: Payer: Self-pay | Admitting: *Deleted

## 2020-05-06 ENCOUNTER — Other Ambulatory Visit: Payer: Medicare Other

## 2020-05-06 DIAGNOSIS — R918 Other nonspecific abnormal finding of lung field: Secondary | ICD-10-CM

## 2020-05-06 NOTE — Telephone Encounter (Signed)
I received referral on Tami Stone.  I called and scheduled her to be seen at [facility] next week.  She verbalized understanding of appt time and place.  I am unable to see images in PACS but Dr. Lamonte Sakai notified me the images are there.

## 2020-05-14 ENCOUNTER — Inpatient Hospital Stay: Payer: Medicare Other | Attending: Internal Medicine | Admitting: Internal Medicine

## 2020-05-14 ENCOUNTER — Ambulatory Visit
Admission: RE | Admit: 2020-05-14 | Discharge: 2020-05-14 | Disposition: A | Discharge status: Home / Self Care | Payer: Medicare Other | Source: Ambulatory Visit | Attending: Radiation Oncology | Admitting: Radiation Oncology

## 2020-05-14 ENCOUNTER — Encounter: Payer: Self-pay | Admitting: Internal Medicine

## 2020-05-14 ENCOUNTER — Inpatient Hospital Stay: Payer: Medicare Other

## 2020-05-14 ENCOUNTER — Other Ambulatory Visit: Payer: Self-pay

## 2020-05-14 ENCOUNTER — Other Ambulatory Visit: Payer: Self-pay | Admitting: *Deleted

## 2020-05-14 VITALS — BP 147/91 | HR 101 | Temp 97.5°F | Resp 18 | Ht 62.0 in | Wt 156.0 lb

## 2020-05-14 DIAGNOSIS — R918 Other nonspecific abnormal finding of lung field: Secondary | ICD-10-CM

## 2020-05-14 DIAGNOSIS — N6323 Unspecified lump in the left breast, lower outer quadrant: Secondary | ICD-10-CM | POA: Insufficient documentation

## 2020-05-14 DIAGNOSIS — Z7984 Long term (current) use of oral hypoglycemic drugs: Secondary | ICD-10-CM | POA: Diagnosis not present

## 2020-05-14 DIAGNOSIS — E7800 Pure hypercholesterolemia, unspecified: Secondary | ICD-10-CM | POA: Diagnosis not present

## 2020-05-14 DIAGNOSIS — C3432 Malignant neoplasm of lower lobe, left bronchus or lung: Secondary | ICD-10-CM | POA: Diagnosis present

## 2020-05-14 DIAGNOSIS — E119 Type 2 diabetes mellitus without complications: Secondary | ICD-10-CM | POA: Insufficient documentation

## 2020-05-14 DIAGNOSIS — C770 Secondary and unspecified malignant neoplasm of lymph nodes of head, face and neck: Secondary | ICD-10-CM | POA: Diagnosis not present

## 2020-05-14 DIAGNOSIS — Z5111 Encounter for antineoplastic chemotherapy: Secondary | ICD-10-CM

## 2020-05-14 DIAGNOSIS — C7951 Secondary malignant neoplasm of bone: Secondary | ICD-10-CM | POA: Diagnosis not present

## 2020-05-14 DIAGNOSIS — C50812 Malignant neoplasm of overlapping sites of left female breast: Secondary | ICD-10-CM

## 2020-05-14 DIAGNOSIS — N6320 Unspecified lump in the left breast, unspecified quadrant: Secondary | ICD-10-CM | POA: Insufficient documentation

## 2020-05-14 DIAGNOSIS — Z803 Family history of malignant neoplasm of breast: Secondary | ICD-10-CM | POA: Insufficient documentation

## 2020-05-14 DIAGNOSIS — K5900 Constipation, unspecified: Secondary | ICD-10-CM | POA: Diagnosis not present

## 2020-05-14 DIAGNOSIS — C3492 Malignant neoplasm of unspecified part of left bronchus or lung: Secondary | ICD-10-CM

## 2020-05-14 DIAGNOSIS — N63 Unspecified lump in unspecified breast: Secondary | ICD-10-CM

## 2020-05-14 DIAGNOSIS — Z79899 Other long term (current) drug therapy: Secondary | ICD-10-CM | POA: Insufficient documentation

## 2020-05-14 DIAGNOSIS — C3490 Malignant neoplasm of unspecified part of unspecified bronchus or lung: Secondary | ICD-10-CM

## 2020-05-14 LAB — CBC WITH DIFFERENTIAL (CANCER CENTER ONLY)
Abs Immature Granulocytes: 0.02 10*3/uL (ref 0.00–0.07)
Basophils Absolute: 0 10*3/uL (ref 0.0–0.1)
Basophils Relative: 1 %
Eosinophils Absolute: 0.3 10*3/uL (ref 0.0–0.5)
Eosinophils Relative: 4 %
HCT: 42.8 % (ref 36.0–46.0)
Hemoglobin: 13.8 g/dL (ref 12.0–15.0)
Immature Granulocytes: 0 %
Lymphocytes Relative: 35 %
Lymphs Abs: 2.6 10*3/uL (ref 0.7–4.0)
MCH: 29.2 pg (ref 26.0–34.0)
MCHC: 32.2 g/dL (ref 30.0–36.0)
MCV: 90.7 fL (ref 80.0–100.0)
Monocytes Absolute: 0.5 10*3/uL (ref 0.1–1.0)
Monocytes Relative: 7 %
Neutro Abs: 3.9 10*3/uL (ref 1.7–7.7)
Neutrophils Relative %: 53 %
Platelet Count: 315 10*3/uL (ref 150–400)
RBC: 4.72 MIL/uL (ref 3.87–5.11)
RDW: 14 % (ref 11.5–15.5)
WBC Count: 7.4 10*3/uL (ref 4.0–10.5)
nRBC: 0 % (ref 0.0–0.2)

## 2020-05-14 LAB — CMP (CANCER CENTER ONLY)
ALT: 17 U/L (ref 0–44)
AST: 18 U/L (ref 15–41)
Albumin: 3.9 g/dL (ref 3.5–5.0)
Alkaline Phosphatase: 123 U/L (ref 38–126)
Anion gap: 9 (ref 5–15)
BUN: 12 mg/dL (ref 8–23)
CO2: 27 mmol/L (ref 22–32)
Calcium: 10.2 mg/dL (ref 8.9–10.3)
Chloride: 101 mmol/L (ref 98–111)
Creatinine: 0.8 mg/dL (ref 0.44–1.00)
GFR, Estimated: >60 mL/min (ref >60)
Glucose, Bld: 103 mg/dL — ABNORMAL HIGH (ref 70–99)
Potassium: 4 mmol/L (ref 3.5–5.1)
Sodium: 137 mmol/L (ref 135–145)
Total Bilirubin: 0.3 mg/dL (ref 0.3–1.2)
Total Protein: 7.5 g/dL (ref 6.5–8.1)

## 2020-05-14 NOTE — Progress Notes (Signed)
The proposed treatment discussed in cancer conference 12/2 is for discussion purpose only and is not a binding recommendation.  The patient was not physically examined nor present for their treatment options.

## 2020-05-14 NOTE — Progress Notes (Signed)
Radiation Oncology         [phone] ________________________________  Name: Tami Stone        MRN: [id number]  Date of Service: 05/14/2020 DOB: May 26, 1951  CC:Tami Cage, MD  Tami Gobble, MD     REFERRING PHYSICIAN: Collene Gobble, MD   DIAGNOSIS: The encounter diagnosis was Mass of lower lobe of left lung.   HISTORY OF PRESENT ILLNESS: Tami Stone is a 69 y.o. female seen at the request of Dr. Julien Stone for a probable advanced left lung cancer. The patient was being evaluated for persistent cough for approximately 3 months and a CXR on 04/09/20 at [facility] showed a possible large mass versus consolidating pneumonia in the retrocardiac portion left lower lobe with small left pleural effusion. Nodular density also suspected along the left paraspinal border above the aortic arch. She proceeded with a CT chest at [facility] on 04/16/20 that revealed a 6 x 5.9 cm left lower lobe mass with adjacent collapsed lung. Moderate left pleural effusion. Left axillary and anterior mediastinal adenopathy with left axillary node measuring 2 x 2 cm and an anterior mediastinal node measuring 2.1 x 1.1 cm was noted. She underwent a bronchoscopy with Dr. Lamonte Sakai on 04/30/20 that revealed an adenocarcinoma in the LLL endobronchial and transbronchial biopsies. She has been discussed in thoracic oncology conference and a mass in the left breast was also noted upon further review. She's met with Dr. Julien Stone and is being set up for a PET scan and MRI brain for staging purposes. She's seen today to introduce possible radiotherapy.     PREVIOUS RADIATION THERAPY: No   PAST MEDICAL HISTORY:  Past Medical History:  Diagnosis Date  . Diabetes ([place])   . High cholesterol        PAST SURGICAL HISTORY: Past Surgical History:  Procedure Laterality Date  . BRONCHIAL BIOPSY  04/30/2020   Procedure: BRONCHIAL BIOPSIES;  Surgeon: Tami Gobble, MD;  Location: [facility] ENDOSCOPY;  Service:  Cardiopulmonary;;  . BRONCHIAL BRUSHINGS  04/30/2020   Procedure: BRONCHIAL BRUSHINGS;  Surgeon: Tami Gobble, MD;  Location: [facility] ENDOSCOPY;  Service: Cardiopulmonary;;  . HEMOSTASIS CONTROL  04/30/2020   Procedure: HEMOSTASIS CONTROL;  Surgeon: Tami Gobble, MD;  Location: [place];  Service: Cardiopulmonary;;  . VIDEO BRONCHOSCOPY N/A 04/30/2020   Procedure: VIDEO BRONCHOSCOPY WITH FLUORO;  Surgeon: Tami Gobble, MD;  Location: [place];  Service: Cardiopulmonary;  Laterality: N/A;     FAMILY HISTORY: No family history on file.   SOCIAL HISTORY:  reports that she has never smoked. She has never used smokeless tobacco. She reports previous alcohol use. She reports that she does not use drugs. The patient is married and lives in [place]. She has an adult daughter with disabilites that she takes care of.    ALLERGIES: Patient has no known allergies.   MEDICATIONS:  Current Outpatient Medications  Medication Sig Dispense Refill  . Cholecalciferol (VITAMIN D3) 250 MCG (10000 UT) capsule Take 10,000 Units by mouth daily.    . Coenzyme Q10 (CO Q 10 PO) Take 300 mg by mouth daily.    . metFORMIN (GLUCOPHAGE) 500 MG tablet Take 1,000 mg by mouth daily with supper.     . Multiple Vitamins-Minerals (MULTIVITAMIN WITH MINERALS) tablet Take 1 tablet by mouth daily.    . NP THYROID 60 MG tablet Take 60 mg by mouth daily.    Marland Kitchen OVER THE COUNTER MEDICATION Take 2 tablets by mouth in the morning, at noon,  and at bedtime. lumbrokinase    . PROGESTERONE PO Take 300 mg by mouth daily.    . pseudoephedrine (SUDAFED) 120 MG 12 hr tablet Take 120 mg by mouth daily.    . vitamin C (ASCORBIC ACID) 250 MG tablet Take 500 mg by mouth 3 (three) times daily.    . Zinc 100 MG TABS Take 100 mg by mouth daily.     No current facility-administered medications for this encounter.     REVIEW OF SYSTEMS: On review of systems, the patient reports that she is doing pretty well overall. She  describes a chronic cough that is worsened with positional changes, is productive with phlegm, but no hemotpysis or shortness of breath. She's had about 10 pounds of weight loss in the last few months. No shortness of breath is reported. No other complaints are noted.     PHYSICAL EXAM:  Wt Readings from Last 3 Encounters:  05/14/20 156 lb (70.8 kg)  04/30/20 157 lb (71.2 kg)  04/28/20 160 lb 6.4 oz (72.8 kg)   Temp Readings from Last 3 Encounters:  05/14/20 (!) 97.5 F (36.4 C)  04/30/20 98 F (36.7 C) (Oral)  04/28/20 (!) 97.4 F (36.3 C)   BP Readings from Last 3 Encounters:  05/14/20 (!) 147/91  04/30/20 (!) 141/86  04/28/20 128/82   Pulse Readings from Last 3 Encounters:  05/14/20 (!) 101  04/30/20 95  04/28/20 (!) 111    In general this is a well appearing caucasian female in no acute distress. She's alert and oriented x4 and appropriate throughout the examination. Cardiopulmonary assessment is negative for acute distress and she exhibits normal effort.    ECOG = 1  0 - Asymptomatic (Fully active, able to carry on all predisease activities without restriction)  1 - Symptomatic but completely ambulatory (Restricted in physically strenuous activity but ambulatory and able to carry out work of a light or sedentary nature. For example, light housework, office work)  2 - Symptomatic, <50% in bed during the day (Ambulatory and capable of all self care but unable to carry out any work activities. Up and about more than 50% of waking hours)  3 - Symptomatic, >50% in bed, but not bedbound (Capable of only limited self-care, confined to bed or chair 50% or more of waking hours)  4 - Bedbound (Completely disabled. Cannot carry on any self-care. Totally confined to bed or chair)  5 - Death   Tami Stone, Tami Stone, Tami Stone, et al. [phone]). "Toxicity and response criteria of the [facility]". [facility] Oncol. 5 (6): 649-55    LABORATORY DATA:  Lab  Results  Component Value Date   WBC 7.4 05/14/2020   HGB 13.8 05/14/2020   HCT 42.8 05/14/2020   MCV 90.7 05/14/2020   PLT 315 05/14/2020   Lab Results  Component Value Date   NA 137 05/14/2020   K 4.0 05/14/2020   CL 101 05/14/2020   CO2 27 05/14/2020   Lab Results  Component Value Date   ALT 17 05/14/2020   AST 18 05/14/2020   ALKPHOS 123 05/14/2020   BILITOT 0.3 05/14/2020      RADIOGRAPHY: DG Chest Port 1 View  Result Date: 04/30/2020 CLINICAL DATA:  Left-sided lung biopsy.  Coughing. EXAM: PORTABLE CHEST 1 VIEW COMPARISON:  None. FINDINGS: Left base collapse/consolidation with probable left effusion. No evidence for pneumothorax. Right lung clear. Cardiopericardial silhouette is at upper limits of normal for size. The visualized bony structures  of the thorax show no acute abnormality. IMPRESSION: Left base retrocardiac collapse/consolidation with probable left pleural effusion. No pneumothorax. Electronically Signed   By: Misty Stanley M.D.   On: 04/30/2020 11:00   DG C-ARM BRONCHOSCOPY  Result Date: 04/30/2020 C-ARM BRONCHOSCOPY: Fluoroscopy was utilized by the requesting physician.  No radiographic interpretation.       IMPRESSION/PLAN: 1. At least Stage IIIA, NSCLC, adenocarcinoma of the LLL. Dr. Lisbeth Renshaw has reviewed her imaging and pathology work up independently and during thoracic oncology conference today. He recommends proceeding with PET imaging and MRI brain for staging purposes. She does have some possible suspicion of enhancement in her pleural surface and may have stage IV disease with an effusion as well. We will follow up with the results of her PET imaging and any further workup necessary to clarify this. She is aware of the rationale after today's discussion for either palliative radiotherapy over 3 weeks time versus concurrent chemoRT over 6 1/2 weeks time. We discussed the risks, benefits, short, and long term effects of radiotherapy, as well as delivery and  logistics of radiotherapy. The paitent was unable to stay any longer due to needing to pick up her daughter this afternoon. We will table our discussion until we have further clarification on her imaging and breast work up. She is in agreement with this plan.  2. Left breast mass and axillary adenopathy. A diagnostic mammogram was ordered urgently and in the orders asked that she have further work up including biopsy as well. We will follow up with these results as she continues getting worked up for her lung cancer. She is aware that she may have a locally advanced breast cancer as a synchronous primary.   In a visit lasting 45 minutes, greater than 50% of the time was spent face to face discussing the patient's condition, in preparation for the discussion, and coordinating the patient's care.    Carola Rhine, PAC

## 2020-05-14 NOTE — Progress Notes (Signed)
[place] Telephone:(336) [phone]   Fax:(336) [phone] Multidisciplinary thoracic oncology clinic  CONSULT NOTE  REFERRING PHYSICIAN: Dr. Baltazar Apo  REASON FOR CONSULTATION:  69 years old white female recently diagnosed with lung cancer.  HPI Tami Stone is a 69 y.o. female a never smoker with past medical history significant for borderline diabetes mellitus and dyslipidemia.  The patient mentions that she has been complaining of cough for several months. Her cough was getting worse and the patient was seen at one of the urgent care center and was found to have a large left lung mass.  She was treated with Z-Pak for suspicious pneumonia but she also had CT scan of the chest on April 16, 2020 and it showed 6.0 x 5.9 cm left lower lobe lung mass with left pleural effusion and left axillary lymphadenopathy as well as anterior mediastinal lymphadenopathy with a small pericardial effusion suspicious for primary lung malignancy.  She also has suspicious lesion in the left breast but she mentions that she has fiducial in that area for a benign lesion that was seen several years ago.  The patient was referred to Dr. Lamonte Sakai and on April 30, 2020 she underwent flexible video fiberoptic bronchoscopy with brushing and biopsies.  The final pathology [id number]) of the endobronchial as well as a transbronchial biopsy of the left lower lobe is consistent with non-small cell carcinoma. The carcinoma is strongly positive with TTF-1 and shows very focal positivity with cytokeratin 5/6 and p40. The strong TTF-1 positivity is most consistent with primary lung adenocarcinoma.  Dr. Lamonte Sakai kindly referred the patient to the multidisciplinary thoracic oncology clinic today for evaluation and recommendation regarding treatment of her condition. When seen today the patient is feeling fine except for the persistent cough and she is currently on NyQuil.  She also complain of fatigue and  shortness of breath but no significant chest pain or hemoptysis.  She denied having any nausea, vomiting, diarrhea but has intermittent constipation.  She lost around 13 pounds in the last few months.  She has no headache or visual changes. Family history significant for mother with blood disorder likely myelofibrosis.  She does not know much about her father's medical history and she has a half-sister with history of breast cancer. The patient is married and has 3 children.  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  She was accompanied by her husband Tami Stone.  She has no history of smoking but she has secondhand smoking exposure.  She drinks alcohol occasionally and no history of drug abuse.  HPI  Past Medical History:  Diagnosis Date  . Diabetes ([place])   . High cholesterol     Past Surgical History:  Procedure Laterality Date  . BRONCHIAL BIOPSY  04/30/2020   Procedure: BRONCHIAL BIOPSIES;  Surgeon: Collene Gobble, MD;  Location: [facility] ENDOSCOPY;  Service: Cardiopulmonary;;  . BRONCHIAL BRUSHINGS  04/30/2020   Procedure: BRONCHIAL BRUSHINGS;  Surgeon: Collene Gobble, MD;  Location: [facility] ENDOSCOPY;  Service: Cardiopulmonary;;  . HEMOSTASIS CONTROL  04/30/2020   Procedure: HEMOSTASIS CONTROL;  Surgeon: Collene Gobble, MD;  Location: [place];  Service: Cardiopulmonary;;  . VIDEO BRONCHOSCOPY N/A 04/30/2020   Procedure: VIDEO BRONCHOSCOPY WITH FLUORO;  Surgeon: Collene Gobble, MD;  Location: [place];  Service: Cardiopulmonary;  Laterality: N/A;    No family history on file.  Social History Social History   Tobacco Use  . Smoking status: Never Smoker  . Smokeless tobacco: Never Used  Vaping Use  . Vaping Use: Never used  Substance Use Topics  . Alcohol use: Not Currently  . Drug use: Never    No Known Allergies  Current Outpatient Medications  Medication Sig Dispense Refill  . Cholecalciferol (VITAMIN D3) 250 MCG (10000 UT) capsule Take 10,000 Units  by mouth daily.    . Coenzyme Q10 (CO Q 10 PO) Take 300 mg by mouth daily.    . metFORMIN (GLUCOPHAGE) 500 MG tablet Take 1,000 mg by mouth daily with supper.     . Multiple Vitamins-Minerals (MULTIVITAMIN WITH MINERALS) tablet Take 1 tablet by mouth daily.    . NP THYROID 60 MG tablet Take 60 mg by mouth daily.    Marland Kitchen OVER THE COUNTER MEDICATION Take 2 tablets by mouth in the morning, at noon, and at bedtime. lumbrokinase    . PROGESTERONE PO Take 300 mg by mouth daily.    . pseudoephedrine (SUDAFED) 120 MG 12 hr tablet Take 120 mg by mouth daily.    . vitamin C (ASCORBIC ACID) 250 MG tablet Take 500 mg by mouth 3 (three) times daily.    . Zinc 100 MG TABS Take 100 mg by mouth daily.     No current facility-administered medications for this visit.    Review of Systems  Constitutional: positive for fatigue and weight loss Eyes: negative Ears, nose, mouth, throat, and face: negative Respiratory: positive for cough and dyspnea on exertion Cardiovascular: negative Gastrointestinal: negative Genitourinary:negative Integument/breast: negative Hematologic/lymphatic: negative Musculoskeletal:negative Neurological: negative Behavioral/Psych: negative Endocrine: negative Allergic/Immunologic: negative  Physical Exam  XVQ:MGQQP, healthy, no distress, well nourished, well developed and anxious SKIN: skin color, texture, turgor are normal, no rashes or significant lesions HEAD: Normocephalic, No masses, lesions, tenderness or abnormalities EYES: normal, PERRLA, Conjunctiva are pink and non-injected EARS: External ears normal, Canals clear OROPHARYNX:no exudate, no erythema and lips, buccal mucosa, and tongue normal  NECK: supple, no adenopathy, no JVD LYMPH:  no palpable lymphadenopathy, no hepatosplenomegaly BREAST:not examined LUNGS: coarse sounds heard, decreased breath sounds HEART: regular rate & rhythm, no murmurs and no gallops ABDOMEN:abdomen soft, non-tender, normal bowel  sounds and no masses or organomegaly BACK: Back symmetric, no curvature., No CVA tenderness EXTREMITIES:no joint deformities, effusion, or inflammation, no edema  NEURO: alert & oriented x 3 with fluent speech, no focal motor/sensory deficits  PERFORMANCE STATUS: ECOG 1  LABORATORY DATA: Lab Results  Component Value Date   WBC 7.4 05/14/2020   HGB 13.8 05/14/2020   HCT 42.8 05/14/2020   MCV 90.7 05/14/2020   PLT 315 05/14/2020      Chemistry      Component Value Date/Time   NA 136 04/28/2020 1533   K 3.9 04/28/2020 1533   CL 100 04/28/2020 1533   CO2 28 04/28/2020 1533   BUN 11 04/28/2020 1533   CREATININE 0.69 04/28/2020 1533      Component Value Date/Time   CALCIUM 9.4 04/28/2020 1533   ALKPHOS 98 04/28/2020 1533   AST 16 04/28/2020 1533   ALT 14 04/28/2020 1533   BILITOT 0.3 04/28/2020 1533       RADIOGRAPHIC STUDIES: DG Chest Port 1 View  Result Date: 04/30/2020 CLINICAL DATA:  Left-sided lung biopsy.  Coughing. EXAM: PORTABLE CHEST 1 VIEW COMPARISON:  None. FINDINGS: Left base collapse/consolidation with probable left effusion. No evidence for pneumothorax. Right lung clear. Cardiopericardial silhouette is at upper limits of normal for size. The visualized bony structures of the thorax show no acute abnormality. IMPRESSION: Left base retrocardiac collapse/consolidation  with probable left pleural effusion. No pneumothorax. Electronically Signed   By: Misty Stanley M.D.   On: 04/30/2020 11:00   DG C-ARM BRONCHOSCOPY  Result Date: 04/30/2020 C-ARM BRONCHOSCOPY: Fluoroscopy was utilized by the requesting physician.  No radiographic interpretation.    ASSESSMENT: This is a very pleasant 69 years old white female recently diagnosed with stage IV (T3, N2, M1a) non-small cell lung cancer, adenocarcinoma diagnosed in November 2021 and presented with large left lower lobe lung mass, anterior mediastinal and left axillary lymphadenopathy as well as left pleural effusion,  pending further staging work-up.   PLAN: I had a lengthy discussion with the patient and her husband today about her current disease stage, prognosis and treatment options. I recommended for the patient to complete the staging work-up by ordering a PET scan and MRI of the brain. I will also order a mammogram to rule out any lesion in the left breast based on the finding on the CT scan. I will send her tissue block as well as blood test to [facility] for molecular studies to identify any actionable mutation and also to check for the level of PD-L1 expression. I will arrange for the patient to come back for follow-up visit in around 2 weeks for evaluation and discussion of her treatment options based on the molecular studies and staging work-up. The patient will see radiation oncology later today for evaluation and discussion of any palliative need for radiation. She was advised to call immediately if she has any concerning symptoms in the interval. The patient voices understanding of current disease status and treatment options and is in agreement with the current care plan.  All questions were answered. The patient knows to call the clinic with any problems, questions or concerns. We can certainly see the patient much sooner if necessary.  Thank you so much for allowing me to participate in the care of Tami Stone. I will continue to follow up the patient with you and assist in her care.  The total time spent in the appointment was 70 minutes.  Disclaimer: This note was dictated with voice recognition software. Similar sounding words can inadvertently be transcribed and may not be corrected upon review.   Tami Stone May 14, 2020, 1:29 PM

## 2020-05-15 ENCOUNTER — Other Ambulatory Visit: Payer: Self-pay | Admitting: Radiation Oncology

## 2020-05-15 DIAGNOSIS — C50812 Malignant neoplasm of overlapping sites of left female breast: Secondary | ICD-10-CM

## 2020-05-15 DIAGNOSIS — R918 Other nonspecific abnormal finding of lung field: Secondary | ICD-10-CM

## 2020-05-15 DIAGNOSIS — N6320 Unspecified lump in the left breast, unspecified quadrant: Secondary | ICD-10-CM

## 2020-05-16 ENCOUNTER — Encounter: Payer: Self-pay | Admitting: Internal Medicine

## 2020-05-16 DIAGNOSIS — Z5111 Encounter for antineoplastic chemotherapy: Secondary | ICD-10-CM | POA: Insufficient documentation

## 2020-05-16 DIAGNOSIS — C3492 Malignant neoplasm of unspecified part of left bronchus or lung: Secondary | ICD-10-CM | POA: Insufficient documentation

## 2020-05-18 ENCOUNTER — Other Ambulatory Visit: Payer: Medicare Other

## 2020-05-18 ENCOUNTER — Telehealth: Payer: Self-pay | Admitting: Emergency Medicine

## 2020-05-18 NOTE — Telephone Encounter (Signed)
Received disk from Dr. Lamonte Sakai. I have placed it upfront for patient to come and pick up in the morning. Nothing further needed at this time.

## 2020-05-18 NOTE — Telephone Encounter (Signed)
I spoke with the patient, reviewed our findings to date.  She needs the CT disk for an appointment tomorrow. I will take it to the office so she can pick it up there.

## 2020-05-18 NOTE — Telephone Encounter (Signed)
I have the disk and can return it to her if needed.   What is the question about her bronchoscopy

## 2020-05-18 NOTE — Telephone Encounter (Signed)
Called and spoke with patient who is asking if Dr. Lamonte Sakai still has the CD of her CT scan from [facility] that she gave to him and is also asking about results of Bronch procedure.    Dr. Lamonte Sakai please advise

## 2020-05-18 NOTE — Telephone Encounter (Signed)
Patient is returning phone call. Patient phone number is [phone]. May leave detailed voicemail message.

## 2020-05-18 NOTE — Telephone Encounter (Signed)
I have called and LM on her VM to call us back about what she is needing?

## 2020-05-19 ENCOUNTER — Ambulatory Visit (HOSPITAL_COMMUNITY)
Admission: RE | Admit: 2020-05-19 | Discharge: 2020-05-19 | Disposition: A | Discharge status: Home / Self Care | Payer: Medicare Other | Source: Ambulatory Visit | Attending: Internal Medicine | Admitting: Internal Medicine

## 2020-05-19 DIAGNOSIS — C3490 Malignant neoplasm of unspecified part of unspecified bronchus or lung: Secondary | ICD-10-CM | POA: Insufficient documentation

## 2020-05-19 IMAGING — MR MR HEAD WO/W CM
14 series · 48 of 48 positions shown · IV contrast (gadavist)
Comparison: None.

CLINICAL DATA: Non-small-cell lung cancer, staging

EXAM:
MRI HEAD WITHOUT AND WITH CONTRAST
TECHNIQUE: Multiplanar, multiecho pulse sequences of the brain and surrounding
structures were obtained without and with intravenous contrast.
CONTRAST:  7mL GADAVIST GADOBUTROL 1 MMOL/ML IV SOLN

[Series 5: DWI · axial · 3.0mm · 1.36mm/px · z∈[-34,+119]mm · 5 of 104 slices shown (1 of 4)]
[im 1/104]
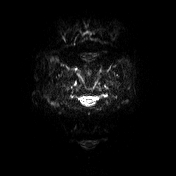
[im 26/104]
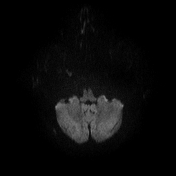
[im 52/104]
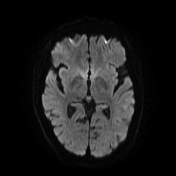
[im 78/104]
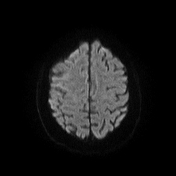
[im 104/104]
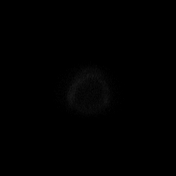

[Series 6: DWI · axial · 3.0mm · 1.36mm/px · z∈[-34,+119]mm · 3 of 52 slices shown (2 of 4)]
[im 1/52]
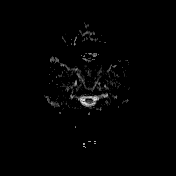
[im 26/52]
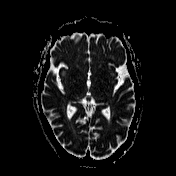
[im 52/52]
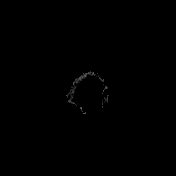

[Series 7: T1 · sagittal · 5.0mm · 0.75mm/px · 1 of 25 slices shown (1 of 2)]
[im 1/25]
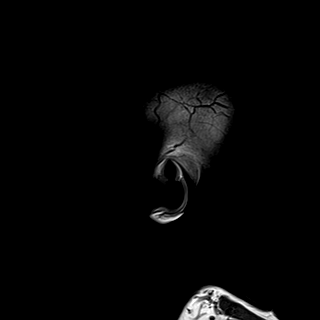

[Series 8: T2 · axial · 5.0mm · 0.62mm/px · 1 of 26 slices shown]
[im 1/26]
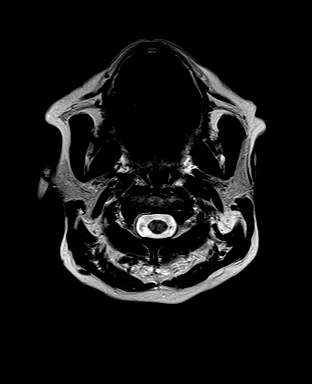

[Series 9: mip_images(sw) · axial · 24.0mm · 0.75mm/px · z∈[-31,+112]mm · 3 of 49 slices shown]
[im 1/49]
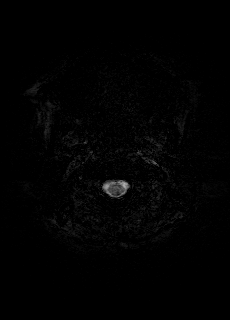
[im 25/49]
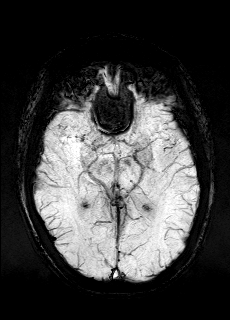
[im 49/49]
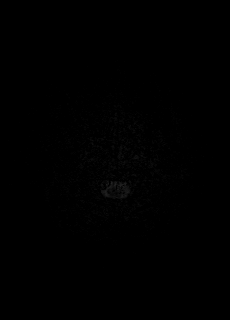

[Series 10: swi_images · axial · 3.0mm · 0.75mm/px · z∈[-42,+122]mm · 3 of 56 slices shown]
[im 1/56]
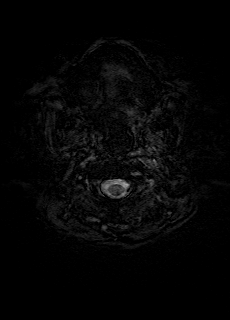
[im 28/56]
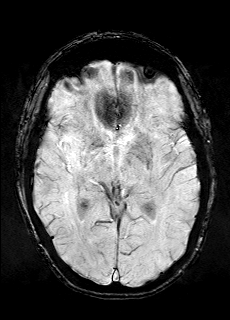
[im 56/56]
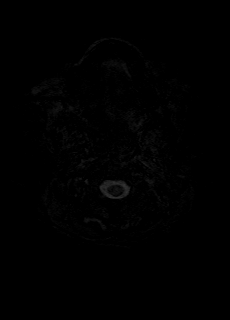

[Series 11: FLAIR · axial · 3.0mm · 0.75mm/px · z∈[-39,+120]mm · 3 of 54 slices shown]
[im 1/54]
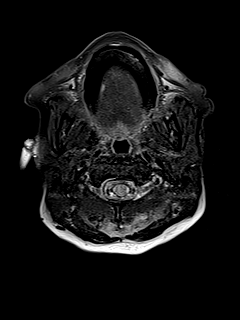
[im 27/54]
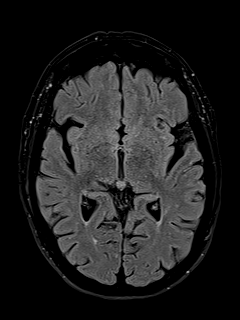
[im 54/54]
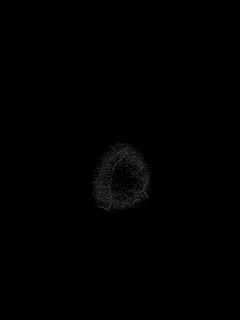

[Series 12: T1 · axial · 1.0mm · 0.94mm/px · z∈[-39,+120]mm · 9 of 160 slices shown (2 of 2)]
[im 1/160]
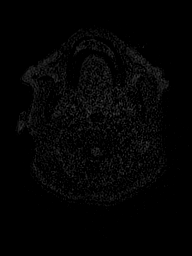
[im 20/160]
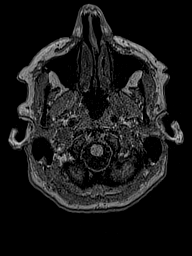
[im 40/160]
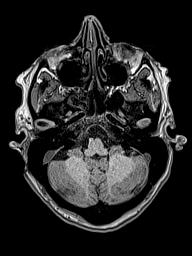
[im 60/160]
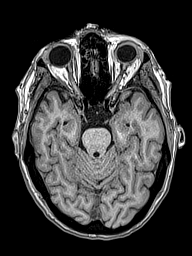
[im 80/160]
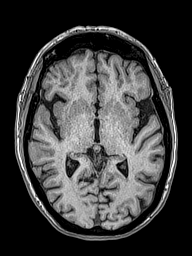
[im 100/160]
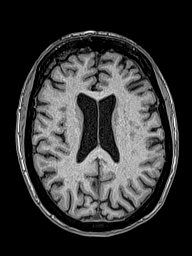
[im 120/160]
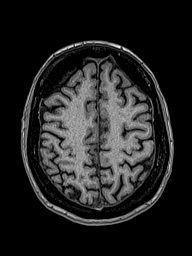
[im 140/160]
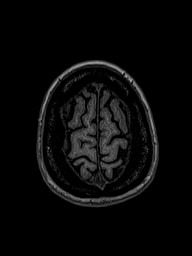
[im 160/160]
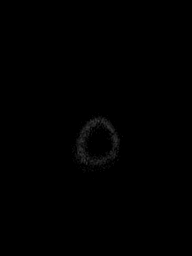

[Series 13: DWI · coronal · 5.0mm · 1.31mm/px · 4 of 72 slices shown (3 of 4)]
[im 1/72]
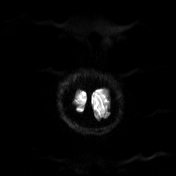
[im 24/72]
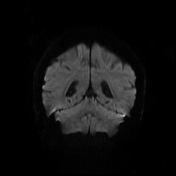
[im 48/72]
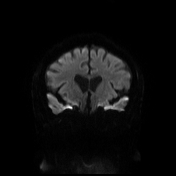
[im 72/72]
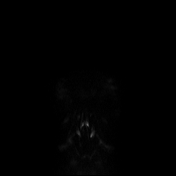

[Series 14: DWI · coronal · 5.0mm · 1.31mm/px · 2 of 36 slices shown (4 of 4)]
[im 1/36]
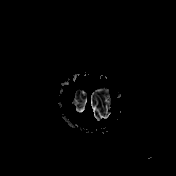
[im 36/36]
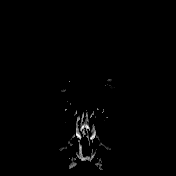

[Series 15: T2 post-contrast · coronal · 5.0mm · 0.57mm/px · 2 of 30 slices shown]
[im 1/30]
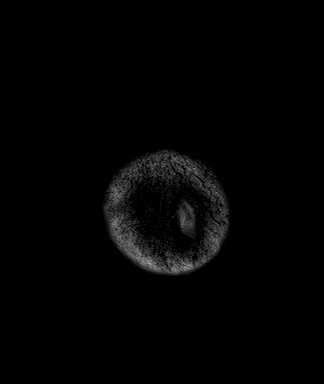
[im 30/30]
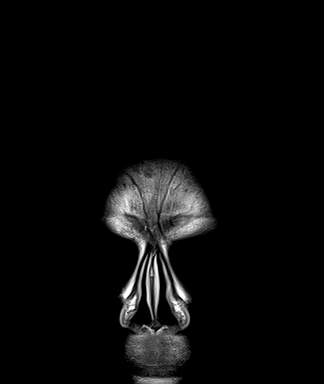

[Series 16: T1 post-contrast · axial · 1.0mm · 0.94mm/px · z∈[-39,+120]mm · 9 of 160 slices shown (1 of 3)]
[im 1/160]
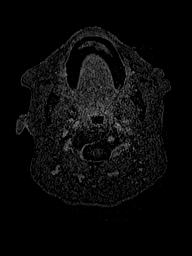
[im 20/160]
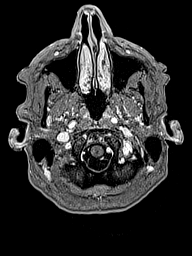
[im 40/160]
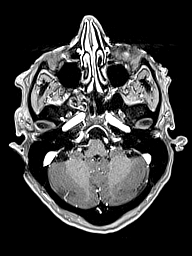
[im 60/160]
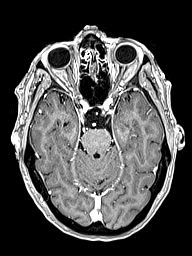
[im 80/160]
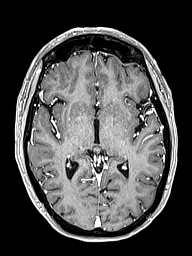
[im 100/160]
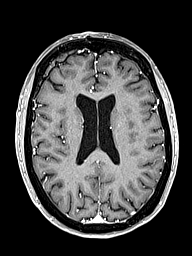
[im 120/160]
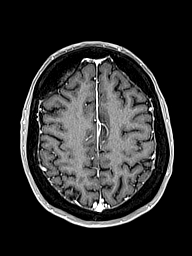
[im 140/160]
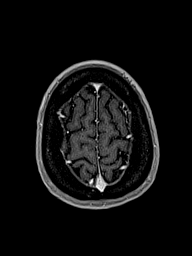
[im 160/160]
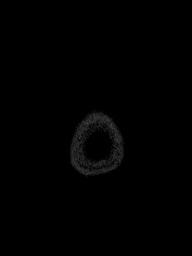

[Series 17: T1 post-contrast · coronal · 5.0mm · 0.43mm/px · 2 of 30 slices shown (2 of 3)]
[im 1/30]
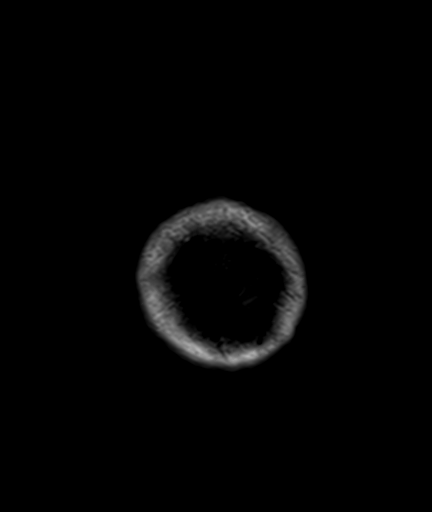
[im 30/30]
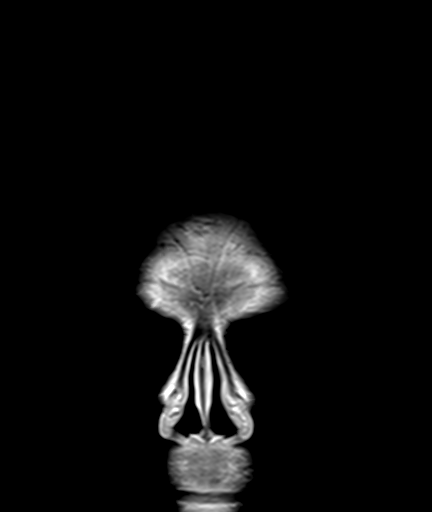

[Series 18: T1 post-contrast · sagittal · 5.0mm · 0.75mm/px · 1 of 25 slices shown (3 of 3)]
[im 1/25]
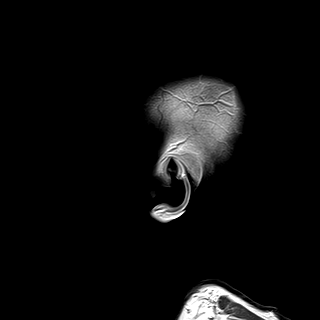

[48 of 48 positions shown; findings below may reference images not displayed]

FINDINGS: Brain: Possible 2 mm enhancing lesion right medial occipital lobe
best seen on axial postcontrast image 79 and coronal postcontrast
image 5. Slight hyperintensity on FLAIR in this area. Alternately,
this could represent a tortuous vessel. Otherwise no enhancing
lesions identified.

Ventricle size normal. Scattered small white matter hyperintensities
bilaterally most consistent with chronic microvascular ischemia.
Negative for acute infarct or hemorrhage.

Vascular: Normal arterial flow voids.

Skull and upper cervical spine: Negative

Sinuses/Orbits: Paranasal sinuses clear.  Negative orbit

Other: None
IMPRESSION: 2 mm enhancement in the right medial occipital lobe could represent
metastatic disease or tortuous vessel. Short term follow-up MRI
without and with contrast recommended four weeks. No other enhancing
lesions identified.

## 2020-05-19 MED ORDER — GADOBUTROL 1 MMOL/ML IV SOLN
7.0000 mL | Freq: Once | INTRAVENOUS | Status: AC | PRN
  Administered 2020-05-19: 7 mL via INTRAVENOUS

## 2020-05-20 ENCOUNTER — Telehealth: Payer: Self-pay | Admitting: Internal Medicine

## 2020-05-20 NOTE — Telephone Encounter (Signed)
Scheduled per los. Called and left msg. Mailed printout  °

## 2020-05-21 ENCOUNTER — Telehealth: Payer: Self-pay

## 2020-05-21 NOTE — Telephone Encounter (Signed)
Pt called wanting to know her MRI results.  Review of pts chart found several scans were ordered for her and per Dr. Ellan Lambert note pt was to complete them and follow-up with him to go over a more complete picture.  I discussed with Dr. Julien Nordmann who advised he did not see anything too concerning but he really needs the complete picture. I have advised the pt as indicated and she expressed understanding of this information.

## 2020-05-22 ENCOUNTER — Other Ambulatory Visit: Payer: Medicare Other

## 2020-05-25 LAB — GUARDANT 360

## 2020-05-26 ENCOUNTER — Encounter (HOSPITAL_COMMUNITY): Admission: RE | Admit: 2020-05-26 | Payer: Medicare Other | Source: Ambulatory Visit

## 2020-05-27 ENCOUNTER — Encounter: Payer: Self-pay | Admitting: *Deleted

## 2020-05-27 NOTE — Progress Notes (Signed)
Oncology Nurse Navigator Documentation  Oncology Nurse Navigator Flowsheets 05/27/2020  Abnormal Finding Date 04/16/2020  Confirmed Diagnosis Date 04/30/2020  Diagnosis Status Pending Molecular Studies  Navigator Follow Up Date: 06/02/2020  Navigator Follow Up Reason: Awaiting Molecular Testing  Navigator Location [facility]  Navigator Encounter Type Other:  [facility] Date 05/14/2020  Multidisiplinary Clinic Type Thoracic  Patient Visit Type Other  Treatment Phase Other  Barriers/Navigation Needs Coordination of Care  Interventions Coordination of Care  Acuity Level 2-Minimal Needs (1-2 Barriers Identified)  Coordination of Care Other

## 2020-05-28 ENCOUNTER — Other Ambulatory Visit: Payer: Medicare Other

## 2020-05-28 ENCOUNTER — Other Ambulatory Visit: Payer: Self-pay

## 2020-05-28 ENCOUNTER — Ambulatory Visit: Payer: Medicare Other | Admitting: Emergency Medicine

## 2020-05-28 ENCOUNTER — Ambulatory Visit (HOSPITAL_COMMUNITY)
Admission: RE | Admit: 2020-05-28 | Discharge: 2020-05-28 | Disposition: A | Discharge status: Home / Self Care | Payer: Medicare Other | Source: Ambulatory Visit | Attending: Internal Medicine | Admitting: Internal Medicine

## 2020-05-28 ENCOUNTER — Telehealth: Payer: Self-pay | Admitting: Medical Oncology

## 2020-05-28 DIAGNOSIS — J90 Pleural effusion, not elsewhere classified: Secondary | ICD-10-CM | POA: Insufficient documentation

## 2020-05-28 DIAGNOSIS — C7951 Secondary malignant neoplasm of bone: Secondary | ICD-10-CM | POA: Insufficient documentation

## 2020-05-28 DIAGNOSIS — C3490 Malignant neoplasm of unspecified part of unspecified bronchus or lung: Secondary | ICD-10-CM | POA: Insufficient documentation

## 2020-05-28 LAB — GLUCOSE, CAPILLARY: Glucose-Capillary: 108 mg/dL — ABNORMAL HIGH (ref 70–99)

## 2020-05-28 IMAGING — PT NM PET TUM IMG INITIAL (PI) SKULL BASE T - THIGH
8 series · 25 of 25 positions shown · non-contrast
Comparison: None available

CLINICAL DATA: Initial treatment strategy for pulmonary mass.

EXAM:
NUCLEAR MEDICINE PET SKULL BASE TO THIGH
TECHNIQUE: 7.9 mCi F-18 FDG was injected intravenously. Full-ring PET imaging
was performed from the skull base to thigh after the radiotracer. CT
data was obtained and used for attenuation correction and anatomic
localization.
Fasting blood glucose: 108 mg/dl

[Series 3: pet sk_thigh ac · axial · 5.0mm · 4.07mm/px · z∈[-1530,-666]mm · 5 of 217 slices shown]
[im 1/217]
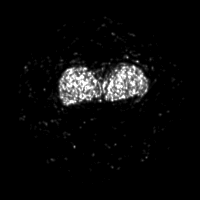
[im 55/217]
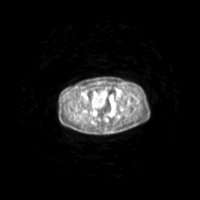
[im 109/217]
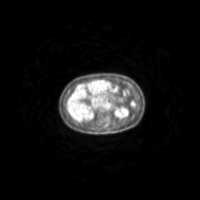
[im 163/217]
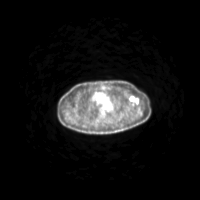
[im 217/217]
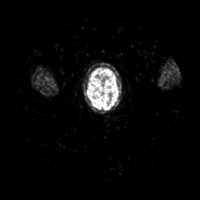

[Series 4: ct sk_thigh 5.0 bf37 · axial · 5.0mm · 0.98mm/px · z∈[-1530,-666]mm · 5 of 217 slices shown]
[im 1/217]
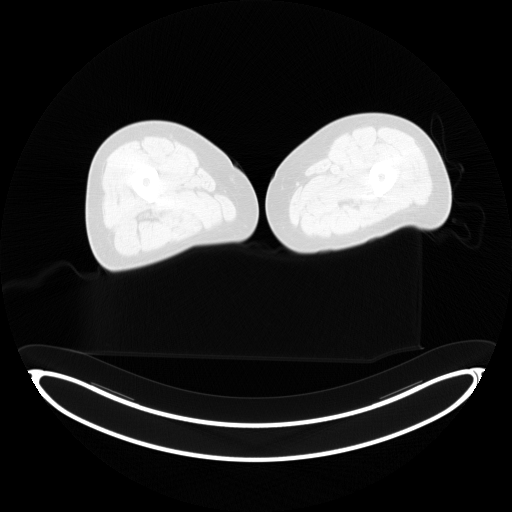
[im 55/217]
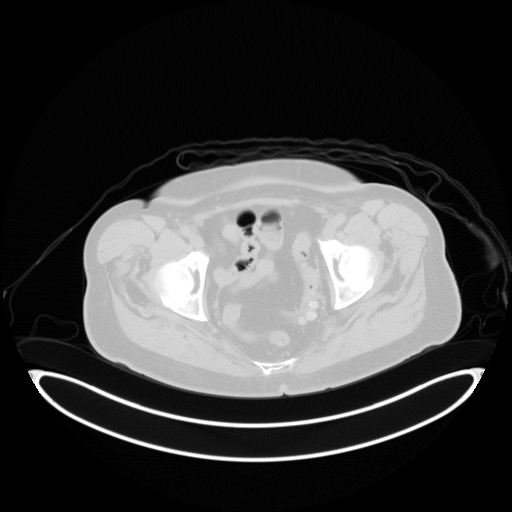
[im 109/217]
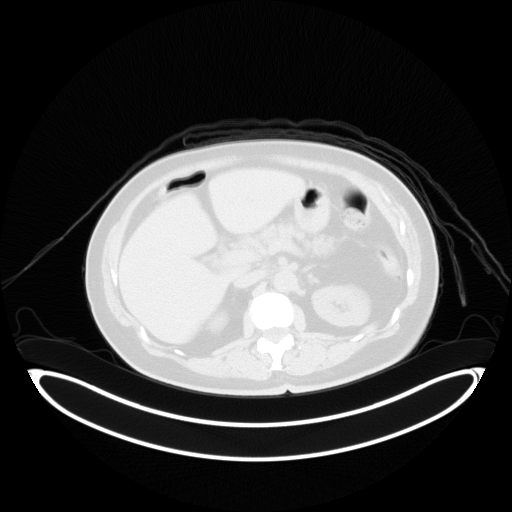
[im 163/217]
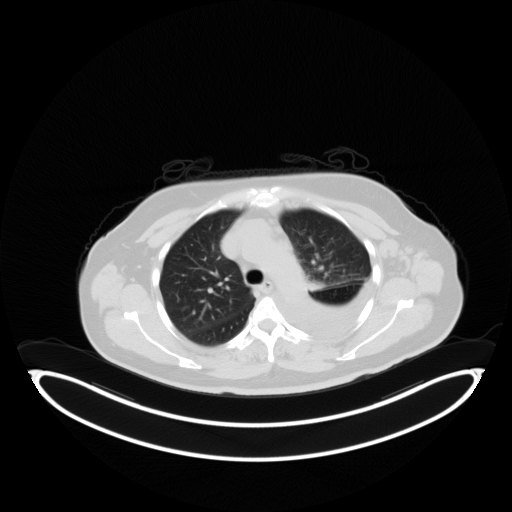
[im 217/217  brain]
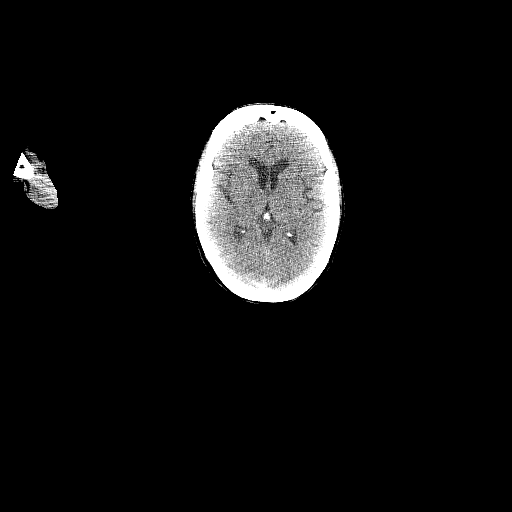

[Series 5: pet sk_thigh nac · axial · 5.0mm · 4.07mm/px · z∈[-1530,-666]mm · 5 of 217 slices shown]
[im 1/217]
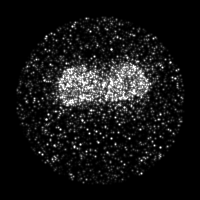
[im 55/217]
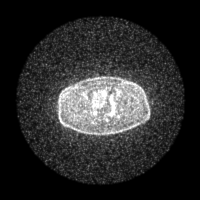
[im 109/217]
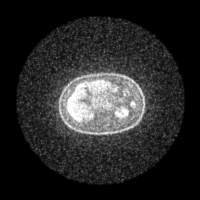
[im 163/217]
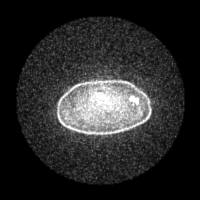
[im 217/217]
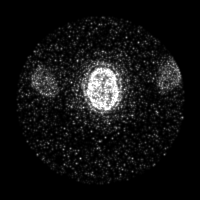

[Series 8: ct sk_thigh 5.0 br59 lung_bone · axial · 5.0mm · 0.67mm/px · z∈[-1078,-806]mm · 2 of 69 slices shown]
[im 1/69]
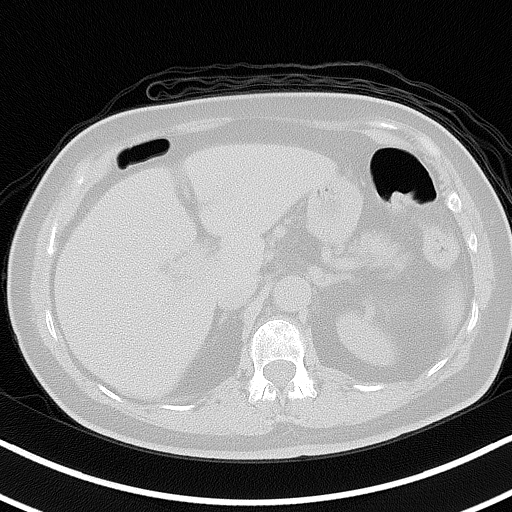
[im 69/69]
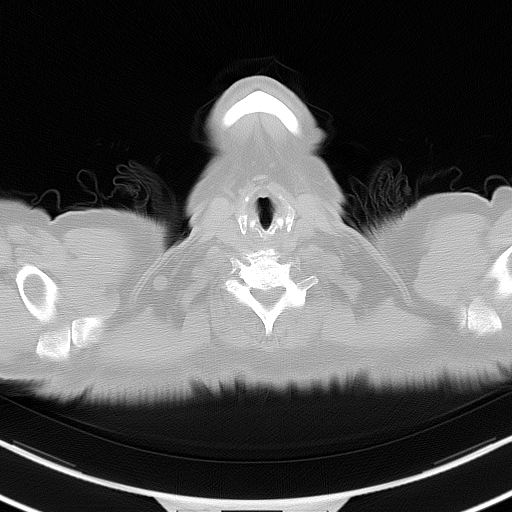

[Series 603: <mip collection> · coronal · 1.79mm/px · 1 of 32 slices shown]
[im 1/32]
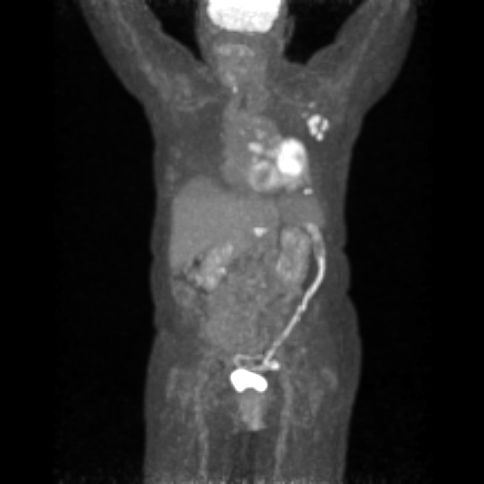

[Series 604: fused cor · 1 of 36 slices shown]
[im 1/36]
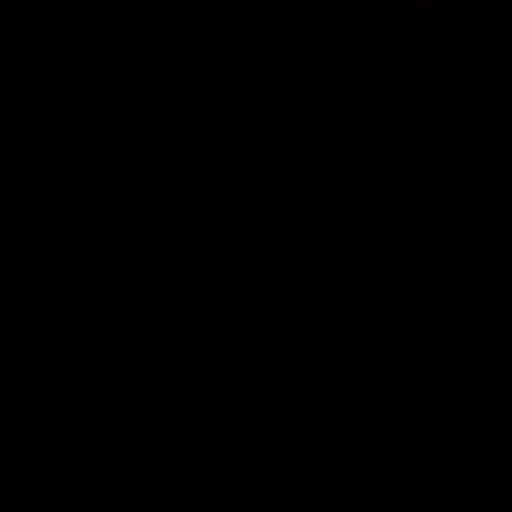

[Series 605: range-ct sk_thigh 5.0 bf37-tra-<alpha range> · 5 of 208 slices shown]
[im 1/208]
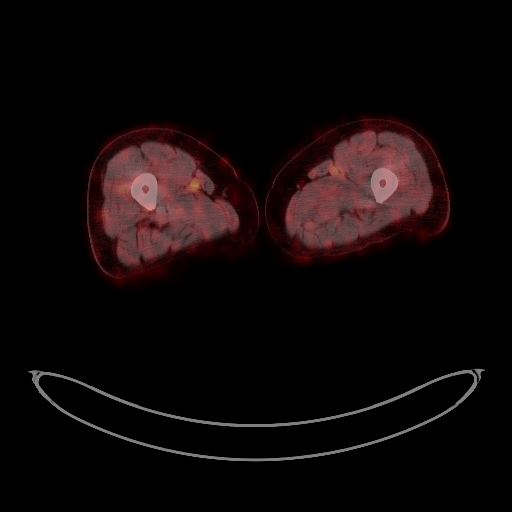
[im 52/208]
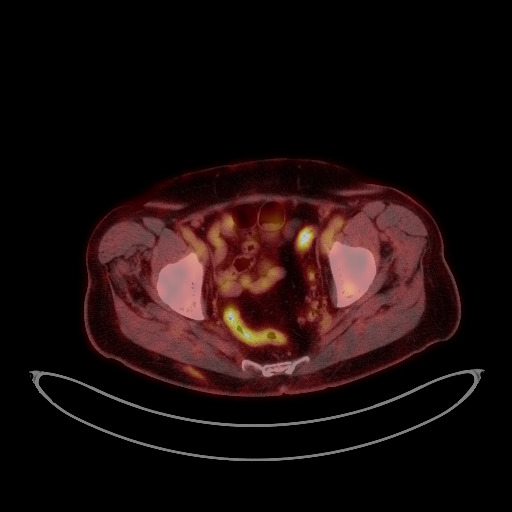
[im 104/208]
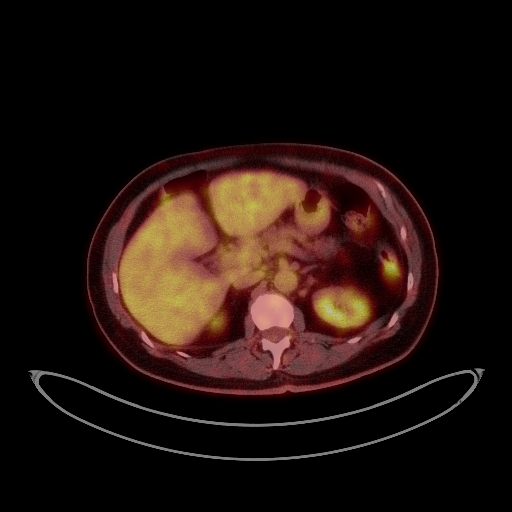
[im 156/208]
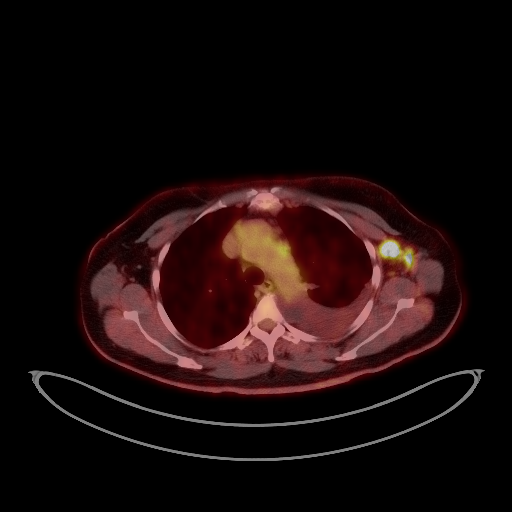
[im 208/208]
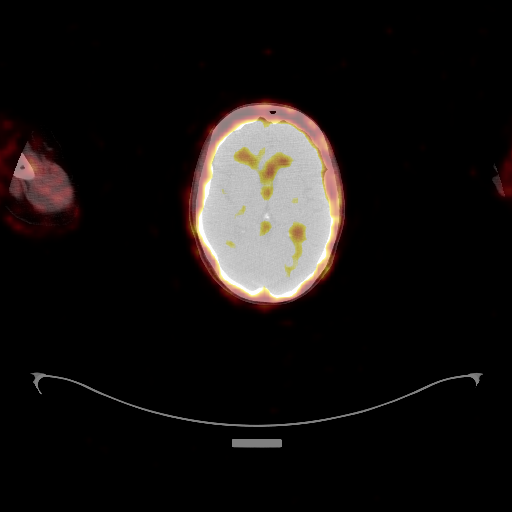

[Series 1090: results mm oncology reading · 1.0mm · 1.00mm/px · 1 of 8 slices shown]
[im 1/8]
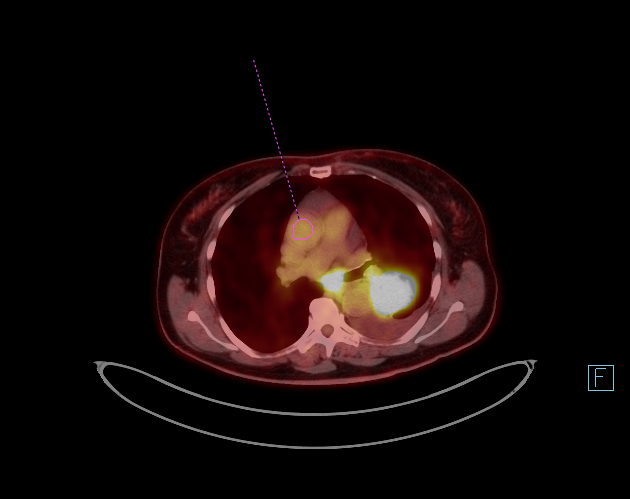

[25 of 25 positions shown; findings below may reference images not displayed]

FINDINGS: Mediastinal blood pool activity: SUV max

Liver activity: SUV max NA

NECK: No hypermetabolic lymph nodes in the neck.

Incidental CT findings: none

CHEST: Hypermetabolic round Mass centered in superior segment LEFT
lower lobe extending to the LEFT hilum measures upwards 5.9 cm and
has intense metabolic activity SUV max equal 14.7.

Hypermetabolic subcarinal, LEFT lower paratracheal, prevascular and
RIGHT lower paratracheal nodes. Example AP window node with SUV max
equal 5.2. Subcarinal node with SUV max equal 8.9.

Hypermetabolic contralateral supraclavicular node measures 7 mm
(image 40) with SUV max equal 6.7.

Additionally there is multiple hypermetabolic LEFT axillary lymph
nodes with SUV max equal 11.4.

Finally, hypermetabolic lymph node in the inferior LEFT breast
tissue with SUV max equal 7.5.

Incidental CT findings: Small pericardial effusion

ABDOMEN/PELVIS: Adrenal glands are normal. No evidence of liver
metastasis. No hypermetabolic lymph nodes in the abdomen pelvis.

Incidental CT findings: Post hysterectomy

SKELETON: Solitary hypermetabolic lesion in the lumbar spine at L1
with SUV max equal 10.5. Subtle sclerosis in the vertebral body at
this level. (Image 140)

Incidental CT findings: none
IMPRESSION: 1. Large hypermetabolic 6 cm LEFT lobe mass consistent primary
bronchogenic carcinoma.
2. Hypermetabolic ipsilateral and contralateral nodal metastasis
including RIGHT supraclavicular nodal metastasis.
3. Hypermetabolic LEFT axillary nodal metastasis and subcutaneous
metastasis inferior to LEFT breast.
4. Solitary skeletal metastasis in the L1 vertebral body.

## 2020-05-28 MED ORDER — FLUDEOXYGLUCOSE F - 18 (FDG) INJECTION
7.6000 | Freq: Once | INTRAVENOUS | Status: AC | PRN
  Administered 2020-05-28: 13:00:00 7.6 via INTRAVENOUS

## 2020-05-28 NOTE — Telephone Encounter (Signed)
Pt asking if she still needs mammogram since she had the PET scan. She is also concerned about her having it and hx of radiation. I told her the mammogram is specific for breast and she should get the mammogram,but I will notify Tami Stone to call you.

## 2020-05-29 ENCOUNTER — Encounter (HOSPITAL_COMMUNITY): Payer: Self-pay | Admitting: Internal Medicine

## 2020-06-01 ENCOUNTER — Other Ambulatory Visit: Payer: Self-pay | Admitting: Radiation Oncology

## 2020-06-01 ENCOUNTER — Other Ambulatory Visit: Payer: Self-pay

## 2020-06-01 ENCOUNTER — Ambulatory Visit
Admission: RE | Admit: 2020-06-01 | Discharge: 2020-06-01 | Disposition: A | Discharge status: Home / Self Care | Payer: Medicare Other | Source: Ambulatory Visit | Attending: Radiation Oncology | Admitting: Radiation Oncology

## 2020-06-01 DIAGNOSIS — C50812 Malignant neoplasm of overlapping sites of left female breast: Secondary | ICD-10-CM

## 2020-06-01 DIAGNOSIS — N6320 Unspecified lump in the left breast, unspecified quadrant: Secondary | ICD-10-CM

## 2020-06-01 DIAGNOSIS — R918 Other nonspecific abnormal finding of lung field: Secondary | ICD-10-CM

## 2020-06-01 IMAGING — MG DIGITAL DIAGNOSTIC BILAT W/ TOMO W/ CAD
6 of 12 series · 6 of 36 positions shown · non-contrast
Comparison: Previous exams including recent PET-CT dated [DATE]
and most recent bilateral screening mammogram dated [DATE]

CLINICAL DATA: Patient with a known primary bronchogenic carcinoma
for which a PET-CT was recently performed. On the recent PET-CT, a
hypermetabolic mass was identified in the lower LEFT breast and
hypermetabolic lymph nodes were identified in the LEFT axilla.

EXAM:
DIGITAL DIAGNOSTIC BILATERAL MAMMOGRAM WITH CAD AND TOMO
ULTRASOUND LEFT BREAST

[L XCCL synth-2D]
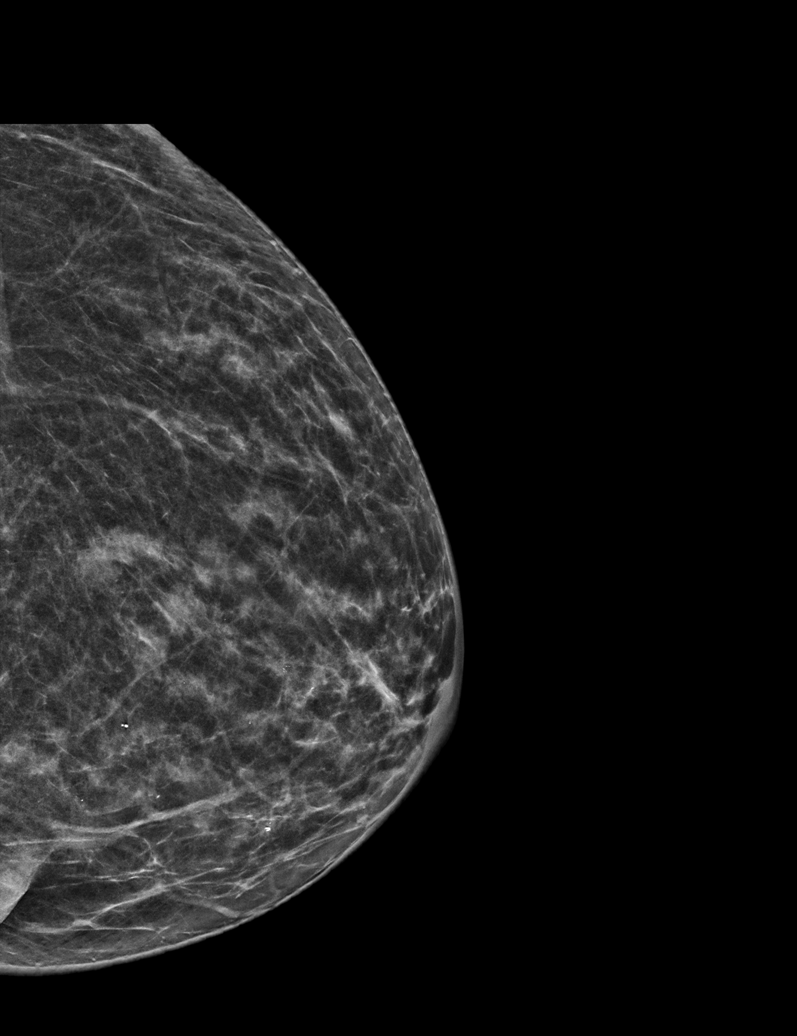

[L MLO synth-2D (1 of 2)]
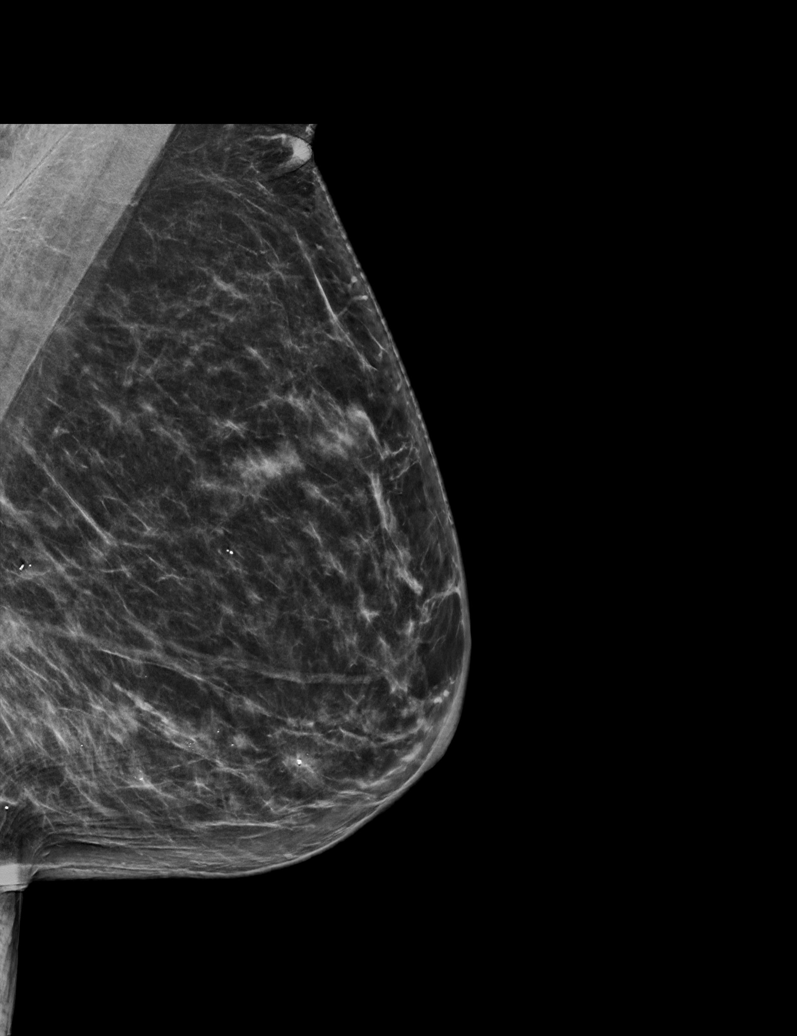

[R MLO synth-2D]
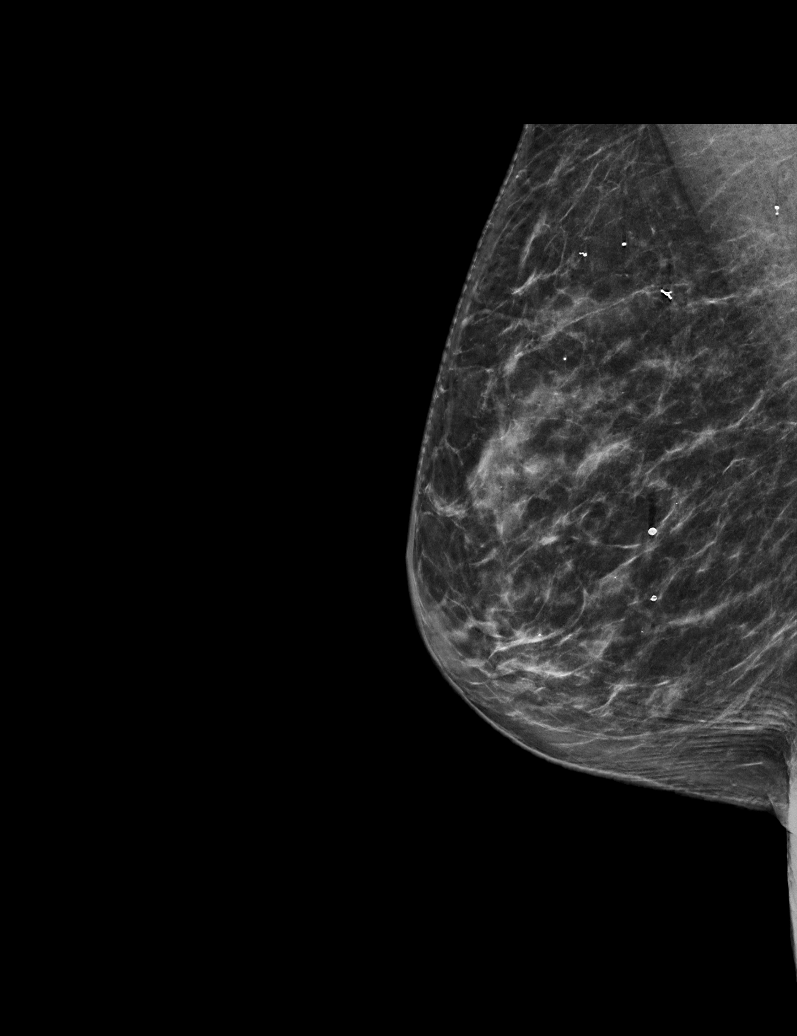

[R CC synth-2D]
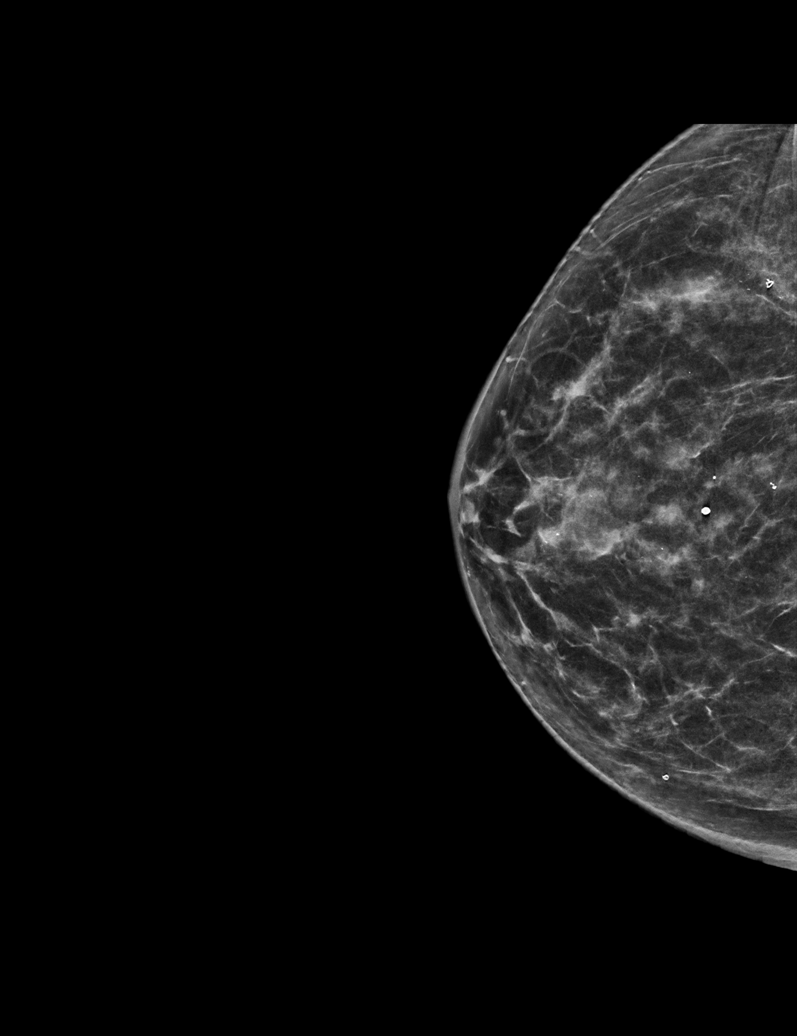

[L CC synth-2D]
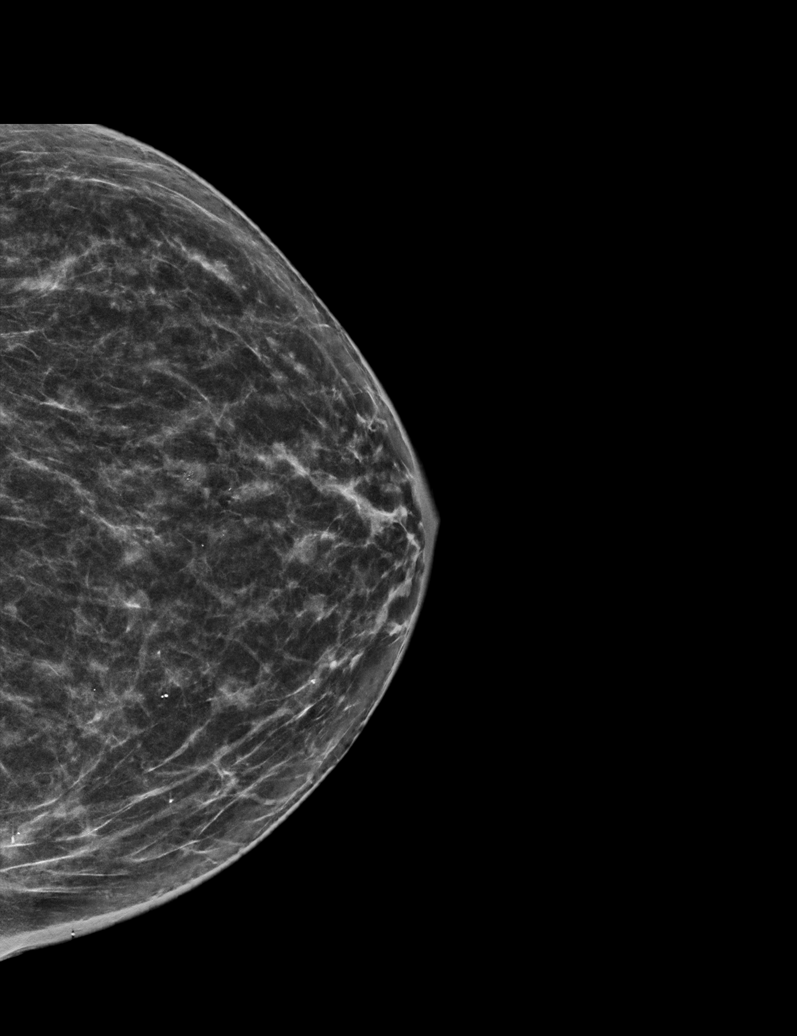

[L MLO synth-2D (2 of 2)]
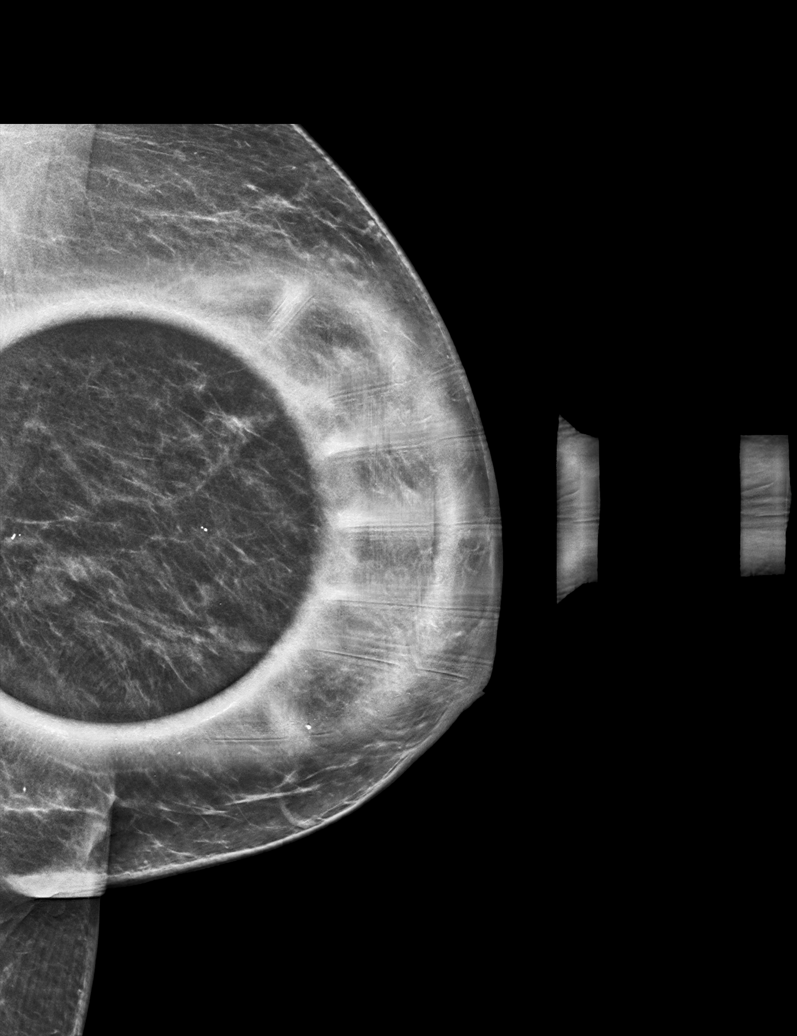

[6 of 36 positions shown; findings below may reference images not displayed]

ACR Breast Density Category b: There are scattered areas of
fibroglandular density.
FINDINGS: An irregular mass is confirmed within the lower outer quadrant of
the LEFT breast, at posterior depth, measuring approximately 1.5 cm
greatest dimension. No new dominant masses, suspicious
calcifications or secondary signs of malignancy elsewhere within the
LEFT breast.

There are no new dominant masses, suspicious calcifications or
secondary signs of malignancy within the RIGHT breast.

Mammographic images were processed with CAD.

Targeted ultrasound is performed, showing an irregular hypoechoic
mass in the LEFT breast at the 4 o'clock axis, 6 cm from the nipple,
measuring 1.6 x 0.9 x 1.2 cm, corresponding to the mammographic
findings and PET-CT findings.

An additional hypoechoic area is identified in the LEFT breast at
the 4 o'clock axis, 3 cm from the nipple, measuring 7 mm greatest
dimension, compatible with an associated satellite lesion.

There are at least 6 enlarged/morphologically abnormal lymph nodes
in the LEFT axilla.
IMPRESSION: 1. Irregular mass in the LEFT breast at the 4 o'clock axis, 6 cm
from the nipple, measuring 1.6 cm, corresponding to the
hypermetabolic mass identified on recent PET-CT. Ultrasound-guided
biopsy is recommended.
2. Additional hypoechoic area in the LEFT breast at the 4 o'clock
axis, 3 cm from nipple, measuring 7 mm, suspected satellite lesion.
3. At least 6 enlarged/morphologically abnormal lymph nodes in the
LEFT axilla, corresponding to the axillary metastases identified on
recent PET-CT. Ultrasound-guided biopsy is recommended.
4. No evidence of malignancy within the RIGHT breast.

RECOMMENDATION:
1. Ultrasound-guided biopsy for the LEFT breast mass at the 4
o'clock axis, 6 cm from the nipple, measuring 1.6 cm, corresponding
to the hypermetabolic mass identified on recent PET-CT.
2. If pathology result is a primary breast cancer and if breast
conservation surgery is considered, an additional ultrasound-guided
biopsy would be necessary for the additional hypoechoic area in the
LEFT breast at the 4 o'clock axis, 3 cm from the nipple, measuring 7
mm. If pathology result is metastatic lung cancer, with no breast
surgery considered, the additional hypoechoic area can be considered
an associated satellite lesion.
3. Ultrasound-guided biopsy of 1 of the enlarged/morphologically
abnormal lymph nodes in the LEFT axilla.

Ultrasound-guided biopsies are scheduled for [REDACTED].

I have discussed the findings and recommendations with the patient.
If applicable, a reminder letter will be sent to the patient
regarding the next appointment.

BI-RADS CATEGORY  5: Highly suggestive of malignancy.

## 2020-06-01 IMAGING — US US BREAST*L* LIMITED INC AXILLA
1 series · 12 of 25 positions shown · non-contrast
Comparison: Previous exams including recent PET-CT dated [DATE]
and most recent bilateral screening mammogram dated [DATE]

CLINICAL DATA: Patient with a known primary bronchogenic carcinoma
for which a PET-CT was recently performed. On the recent PET-CT, a
hypermetabolic mass was identified in the lower LEFT breast and
hypermetabolic lymph nodes were identified in the LEFT axilla.

EXAM:
DIGITAL DIAGNOSTIC BILATERAL MAMMOGRAM WITH CAD AND TOMO
ULTRASOUND LEFT BREAST

[Series 1: us breast*left* limited inc axilla · 0.05mm/px · 12 of 25 slices shown]
[im 2/25]
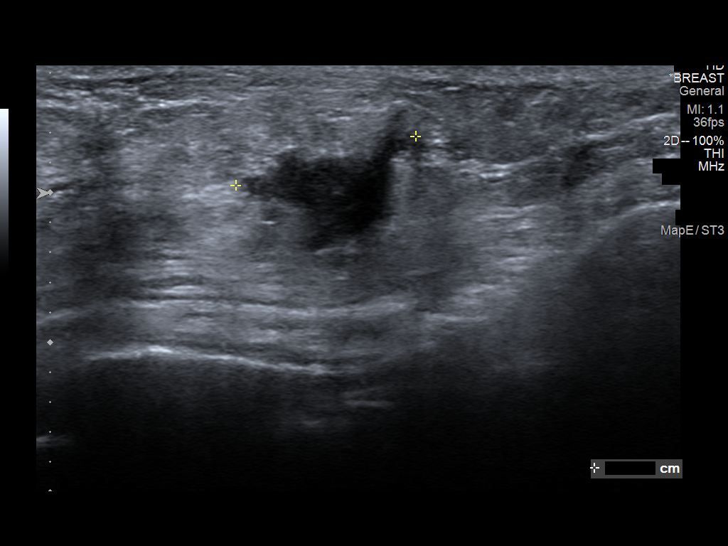
[im 4/25]
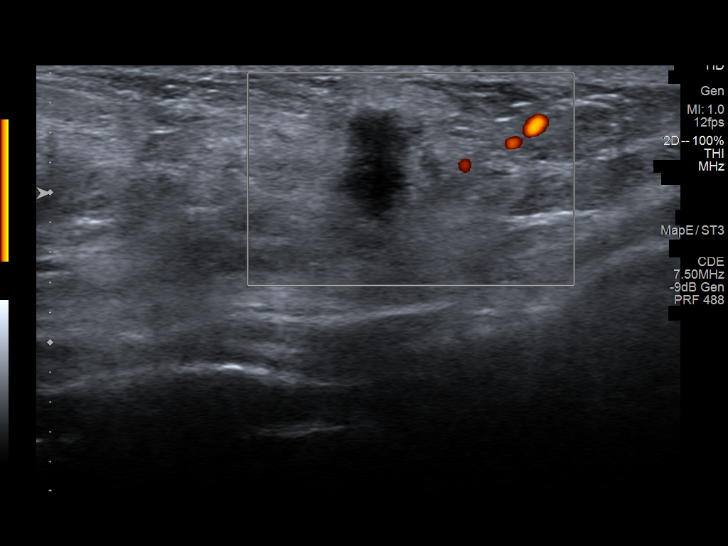
[im 6/25]
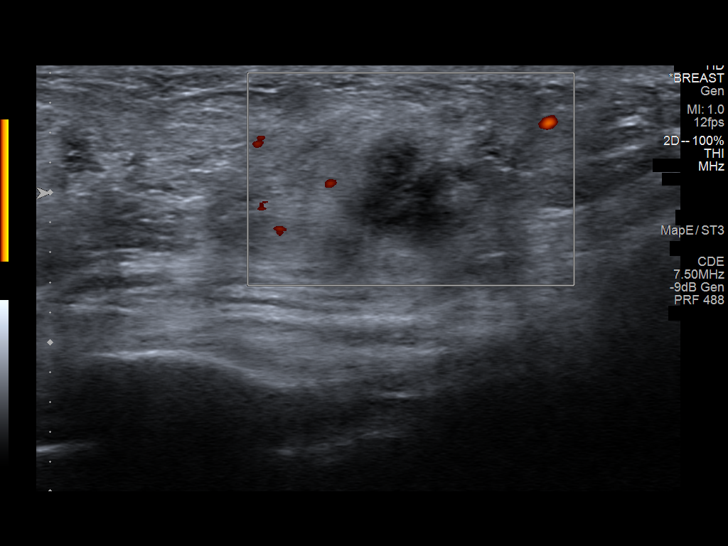
[im 8/25]
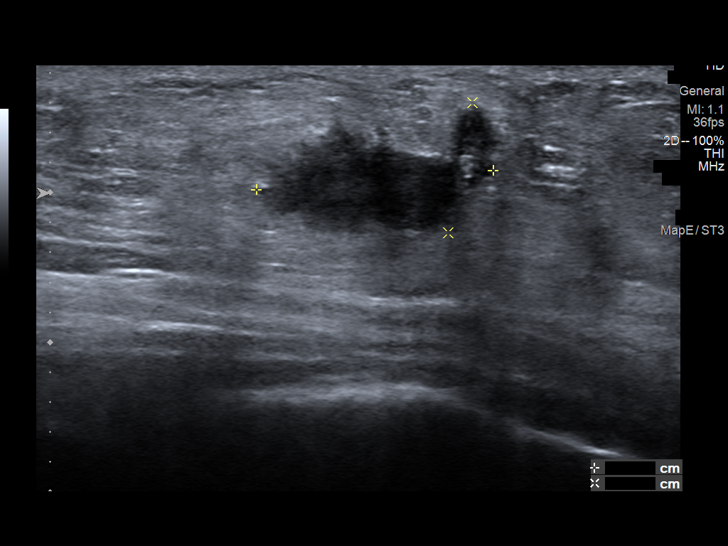
[im 10/25]
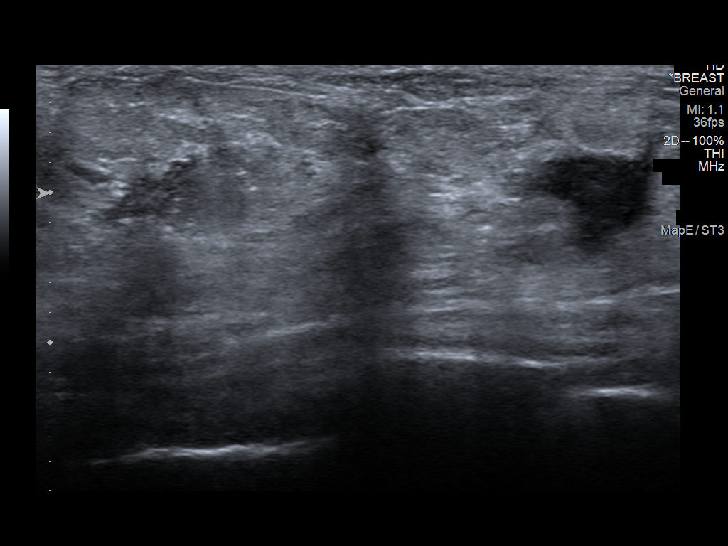
[im 12/25]
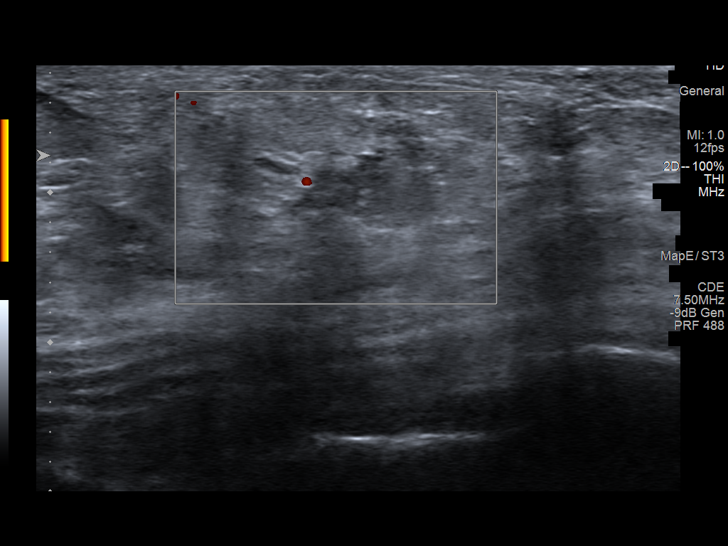
[im 14/25]
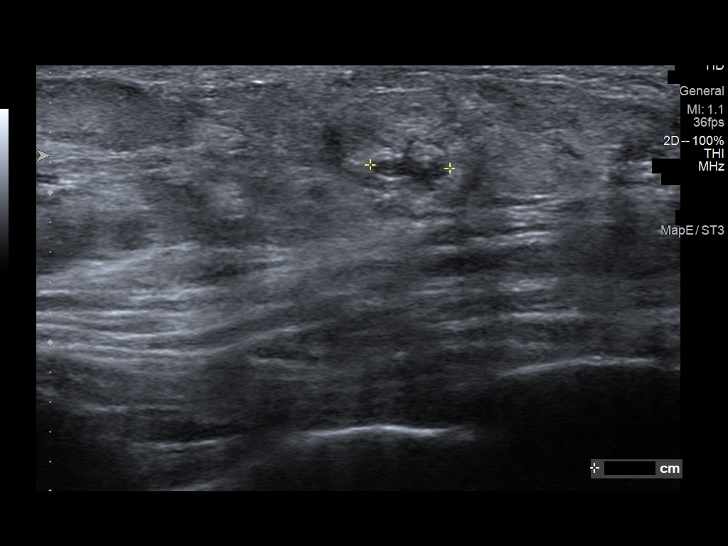
[im 16/25]
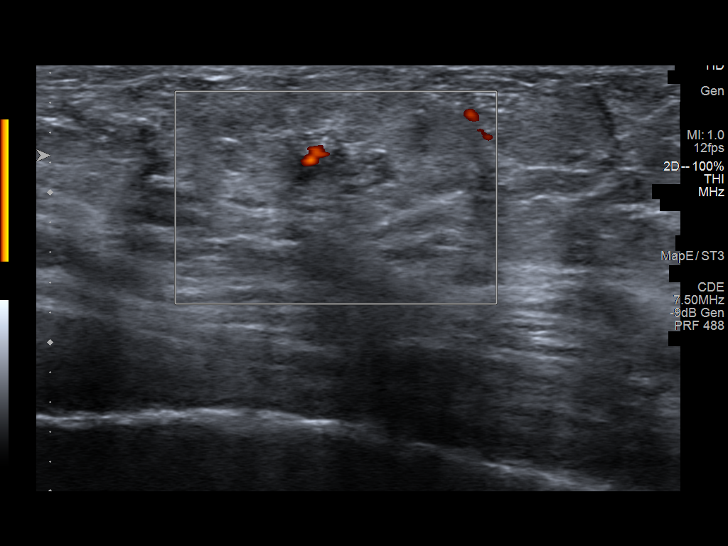
[im 18/25]
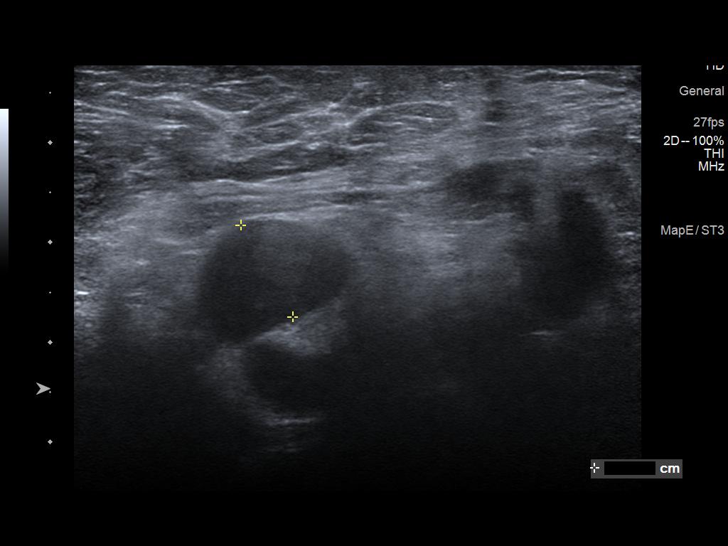
[im 20/25]
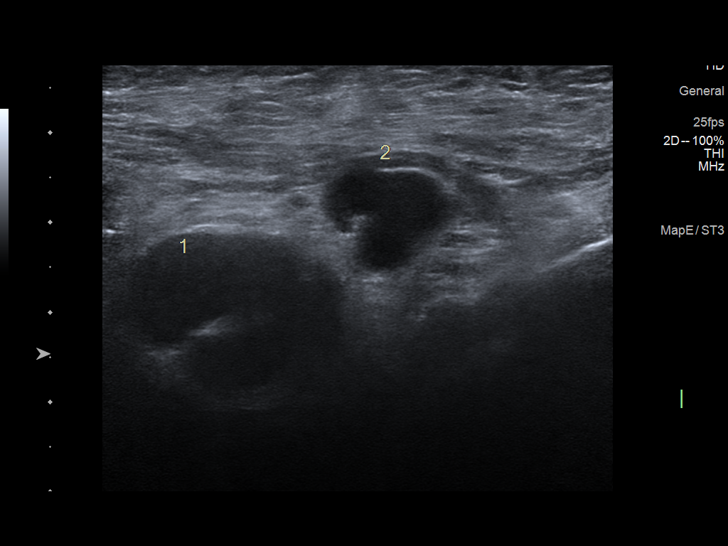
[im 22/25]
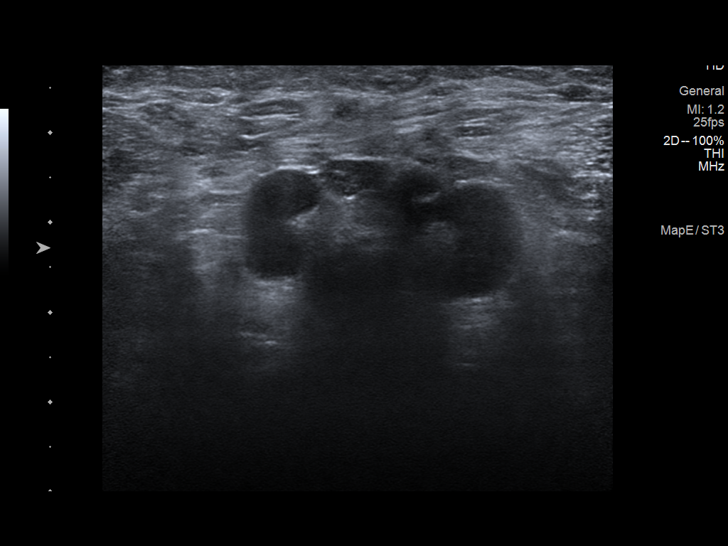
[im 24/25]
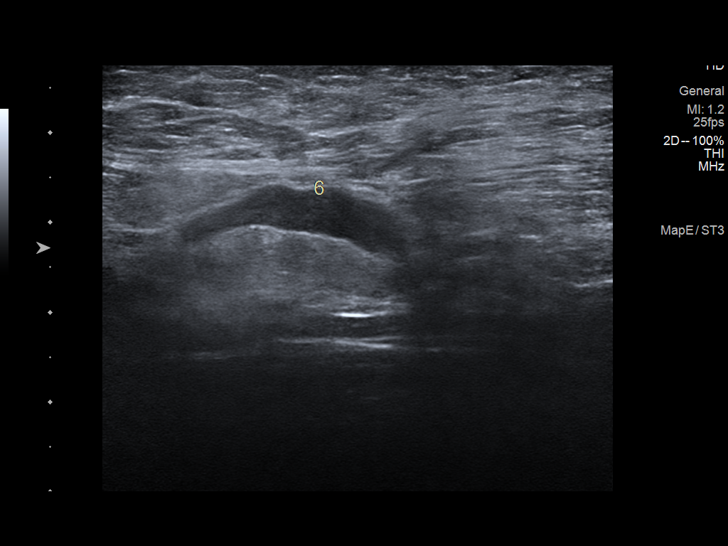

[12 of 25 positions shown; findings below may reference images not displayed]

ACR Breast Density Category b: There are scattered areas of
fibroglandular density.
FINDINGS: An irregular mass is confirmed within the lower outer quadrant of
the LEFT breast, at posterior depth, measuring approximately 1.5 cm
greatest dimension. No new dominant masses, suspicious
calcifications or secondary signs of malignancy elsewhere within the
LEFT breast.

There are no new dominant masses, suspicious calcifications or
secondary signs of malignancy within the RIGHT breast.

Mammographic images were processed with CAD.

Targeted ultrasound is performed, showing an irregular hypoechoic
mass in the LEFT breast at the 4 o'clock axis, 6 cm from the nipple,
measuring 1.6 x 0.9 x 1.2 cm, corresponding to the mammographic
findings and PET-CT findings.

An additional hypoechoic area is identified in the LEFT breast at
the 4 o'clock axis, 3 cm from the nipple, measuring 7 mm greatest
dimension, compatible with an associated satellite lesion.

There are at least 6 enlarged/morphologically abnormal lymph nodes
in the LEFT axilla.
IMPRESSION: 1. Irregular mass in the LEFT breast at the 4 o'clock axis, 6 cm
from the nipple, measuring 1.6 cm, corresponding to the
hypermetabolic mass identified on recent PET-CT. Ultrasound-guided
biopsy is recommended.
2. Additional hypoechoic area in the LEFT breast at the 4 o'clock
axis, 3 cm from nipple, measuring 7 mm, suspected satellite lesion.
3. At least 6 enlarged/morphologically abnormal lymph nodes in the
LEFT axilla, corresponding to the axillary metastases identified on
recent PET-CT. Ultrasound-guided biopsy is recommended.
4. No evidence of malignancy within the RIGHT breast.

RECOMMENDATION:
1. Ultrasound-guided biopsy for the LEFT breast mass at the 4
o'clock axis, 6 cm from the nipple, measuring 1.6 cm, corresponding
to the hypermetabolic mass identified on recent PET-CT.
2. If pathology result is a primary breast cancer and if breast
conservation surgery is considered, an additional ultrasound-guided
biopsy would be necessary for the additional hypoechoic area in the
LEFT breast at the 4 o'clock axis, 3 cm from the nipple, measuring 7
mm. If pathology result is metastatic lung cancer, with no breast
surgery considered, the additional hypoechoic area can be considered
an associated satellite lesion.
3. Ultrasound-guided biopsy of 1 of the enlarged/morphologically
abnormal lymph nodes in the LEFT axilla.

Ultrasound-guided biopsies are scheduled for [REDACTED].

I have discussed the findings and recommendations with the patient.
If applicable, a reminder letter will be sent to the patient
regarding the next appointment.

BI-RADS CATEGORY  5: Highly suggestive of malignancy.

## 2020-06-02 ENCOUNTER — Inpatient Hospital Stay: Payer: Medicare Other

## 2020-06-02 ENCOUNTER — Encounter: Payer: Self-pay | Admitting: Internal Medicine

## 2020-06-02 ENCOUNTER — Other Ambulatory Visit: Payer: Self-pay

## 2020-06-02 ENCOUNTER — Inpatient Hospital Stay (HOSPITAL_BASED_OUTPATIENT_CLINIC_OR_DEPARTMENT_OTHER): Payer: Medicare Other | Admitting: Internal Medicine

## 2020-06-02 ENCOUNTER — Telehealth: Payer: Self-pay | Admitting: Pharmacist

## 2020-06-02 ENCOUNTER — Telehealth: Payer: Self-pay

## 2020-06-02 VITALS — BP 156/90 | HR 101 | Temp 97.8°F | Resp 18 | Ht 62.0 in | Wt 155.1 lb

## 2020-06-02 DIAGNOSIS — C3432 Malignant neoplasm of lower lobe, left bronchus or lung: Secondary | ICD-10-CM | POA: Diagnosis not present

## 2020-06-02 DIAGNOSIS — C3492 Malignant neoplasm of unspecified part of left bronchus or lung: Secondary | ICD-10-CM

## 2020-06-02 DIAGNOSIS — Z7189 Other specified counseling: Secondary | ICD-10-CM | POA: Diagnosis not present

## 2020-06-02 DIAGNOSIS — C3490 Malignant neoplasm of unspecified part of unspecified bronchus or lung: Secondary | ICD-10-CM

## 2020-06-02 DIAGNOSIS — Z5111 Encounter for antineoplastic chemotherapy: Secondary | ICD-10-CM

## 2020-06-02 LAB — CBC WITH DIFFERENTIAL (CANCER CENTER ONLY)
Abs Immature Granulocytes: 0.02 10*3/uL (ref 0.00–0.07)
Basophils Absolute: 0 10*3/uL (ref 0.0–0.1)
Basophils Relative: 1 %
Eosinophils Absolute: 0.5 10*3/uL (ref 0.0–0.5)
Eosinophils Relative: 6 %
HCT: 42 % (ref 36.0–46.0)
Hemoglobin: 13.7 g/dL (ref 12.0–15.0)
Immature Granulocytes: 0 %
Lymphocytes Relative: 27 %
Lymphs Abs: 2.3 10*3/uL (ref 0.7–4.0)
MCH: 29.5 pg (ref 26.0–34.0)
MCHC: 32.6 g/dL (ref 30.0–36.0)
MCV: 90.5 fL (ref 80.0–100.0)
Monocytes Absolute: 0.5 10*3/uL (ref 0.1–1.0)
Monocytes Relative: 6 %
Neutro Abs: 5 10*3/uL (ref 1.7–7.7)
Neutrophils Relative %: 60 %
Platelet Count: 301 10*3/uL (ref 150–400)
RBC: 4.64 MIL/uL (ref 3.87–5.11)
RDW: 14 % (ref 11.5–15.5)
WBC Count: 8.4 10*3/uL (ref 4.0–10.5)
nRBC: 0 % (ref 0.0–0.2)

## 2020-06-02 LAB — CMP (CANCER CENTER ONLY)
ALT: 18 U/L (ref 0–44)
AST: 20 U/L (ref 15–41)
Albumin: 3.9 g/dL (ref 3.5–5.0)
Alkaline Phosphatase: 120 U/L (ref 38–126)
Anion gap: 11 (ref 5–15)
BUN: 9 mg/dL (ref 8–23)
CO2: 23 mmol/L (ref 22–32)
Calcium: 9.6 mg/dL (ref 8.9–10.3)
Chloride: 105 mmol/L (ref 98–111)
Creatinine: 0.78 mg/dL (ref 0.44–1.00)
GFR, Estimated: >60 mL/min (ref >60)
Glucose, Bld: 107 mg/dL — ABNORMAL HIGH (ref 70–99)
Potassium: 4.4 mmol/L (ref 3.5–5.1)
Sodium: 139 mmol/L (ref 135–145)
Total Bilirubin: 0.4 mg/dL (ref 0.3–1.2)
Total Protein: 7.5 g/dL (ref 6.5–8.1)

## 2020-06-02 MED ORDER — TRAMETINIB DIMETHYL SULFOXIDE 2 MG PO TABS: 2.0000 mg | ORAL_TABLET | Freq: Every day | ORAL | 3 refills | Status: DC

## 2020-06-02 MED ORDER — DABRAFENIB MESYLATE 75 MG PO CAPS: 150.0000 mg | ORAL_CAPSULE | Freq: Two times a day (BID) | ORAL | 3 refills | Status: DC

## 2020-06-02 NOTE — Progress Notes (Signed)
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 50% (TPS) ALK Rearrangement Status: Negative BRAF V600E Mutation Status: Positive Intent of Therapy: Non-Curative / Palliative Intent, Discussed with Patient

## 2020-06-02 NOTE — Telephone Encounter (Signed)
Oral Oncology Pharmacist Encounter  Received new prescription for Tafinlar and Mekinist combination for the treatment of BRAF mutation positive stage IV NSCLC, treatment duration until disease progression or unacceptable toxicity.   Labs from 06/02/2020 assessed, all wnl. Prescription dose and frequency assessed.   Current medication list in Epic reviewed, no DDIs with Tafinlar and Mekinist identified.   Prescription has been e-scribed to the [facility] for benefits analysis and approval.  Oral Oncology Clinic will continue to follow for insurance authorization, copayment issues, initial counseling and start date.  Eddie Candle, PharmD, BCPS PGY2 Hematology/Oncology Pharmacy Resident Oral Chemotherapy Navigation Clinic 06/02/2020 4:32 PM

## 2020-06-02 NOTE — Telephone Encounter (Signed)
Oral Oncology Patient Advocate Encounter  Completed an online application for [employer] [facility] [place] ([employer]) in an effort to reduce the patient's out of pocket expense for K[place]ett and Mekinist to $0.    Application completed and faxed to [phone].   [employer] phone number for follow up is [phone].   This encounter will be updated until final determination.   [facility] [facility] [place] Phone [phone] Fax [phone] 06/02/2020 11:27 AM

## 2020-06-02 NOTE — Progress Notes (Signed)
    [facility] [phone]   [phone]  OFFICE PROGRESS NOTE  Young, Lari, MD [address] [place] [address]  DIAGNOSIS: stage IV (T3, N2, M1c) non-small cell lung cancer, adenocarcinoma diagnosed in November 2021 and presented with large left lower lobe lung mass, anterior mediastinal and left axillary lymphadenopathy as well as left pleural effusion and bone metastasis.  Biomarker Findings Microsatellite status - Cannot Be Determined ? Tumor Mutational Burden - Cannot Be Determined Genomic Findings For a complete list of the genes assayed, please refer to the Appendix. BRAF V600E NF2 deletion exons 9-11 TMPRSS2 DIP2A-TMPRSS2 non-canonical fusion TP53 Q192* 7 Disease relevant genes with no reportable alterations: ALK, EGFR, ERBB2, KRAS, MET, RET, ROS1  PD-L1 expression 95%  PRIOR THERAPY: None.  CURRENT THERAPY: Targeted therapy with dabrafenib 150 mg p.o. twice daily and trametinib 2 mg p.o. daily.  Treatment is expected to start in the next few days.  INTERVAL HISTORY: Tami Stone 69 y.o. female returns to the clinic today for follow-up visit accompanied by her husband.  The patient is feeling fine today with no concerning complaints except for generalized fatigue.  She is expected to have biopsy of the suspicious lesion and the left breast tomorrow.  The patient had several studies performed recently including a PET scan as well as MRI of the brain.  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  PD-L1 expression was 95%.  She denied having any current chest pain, shortness of breath except with exertion with mild cough and no hemoptysis.  She denied having any nausea, vomiting, diarrhea or constipation.  She has no headache or visual changes.  She was seen by an integrative medicine physician in [place] and she is planning to start her on high-dose  vitamin C and she is also trying some over-the-counter herbal medication that claimed to help cancer treatment.  The patient is here today for evaluation and discussion of her treatment options.  MEDICAL HISTORY: Past Medical History:  Diagnosis Date  . Diabetes (HCC)   . High cholesterol     ALLERGIES:  has No Known Allergies.  MEDICATIONS:  Current Outpatient Medications  Medication Sig Dispense Refill  . Cholecalciferol (VITAMIN D3) 250 MCG (10000 UT) capsule Take 10,000 Units by mouth daily.    . Coenzyme Q10 (CO Q 10 PO) Take 300 mg by mouth daily.    . metFORMIN (GLUCOPHAGE) 500 MG tablet Take 1,000 mg by mouth daily with supper.     . Multiple Vitamins-Minerals (MULTIVITAMIN WITH MINERALS) tablet Take 1 tablet by mouth daily.    . NP THYROID 60 MG tablet Take 60 mg by mouth daily.    . OVER THE COUNTER MEDICATION Take 2 tablets by mouth in the morning, at noon, and at bedtime. lumbrokinase    . PROGESTERONE PO Take 300 mg by mouth daily.    . pseudoephedrine (SUDAFED) 120 MG 12 hr tablet Take 120 mg by mouth daily.    . vitamin C (ASCORBIC ACID) 250 MG tablet Take 500 mg by mouth 3 (three) times daily.    . Zinc 100 MG TABS Take 100 mg by mouth daily.     No current facility-administered medications for this visit.    SURGICAL HISTORY:  Past Surgical History:  Procedure Laterality Date  . BREAST BIOPSY Right   . BRONCHIAL BIOPSY  04/30/2020   Procedure: BRONCHIAL BIOPSIES;  Surgeon:   Byrum, Robert S, MD;  Location: [facility];  Service: Cardiopulmonary;;  . BRONCHIAL BRUSHINGS  04/30/2020   Procedure: BRONCHIAL BRUSHINGS;  Surgeon: Byrum, Robert S, MD;  Location: [facility];  Service: Cardiopulmonary;;  . HEMOSTASIS CONTROL  04/30/2020   Procedure: HEMOSTASIS CONTROL;  Surgeon: Byrum, Robert S, MD;  Location: [facility];  Service: Cardiopulmonary;;  . VIDEO BRONCHOSCOPY N/A 04/30/2020   Procedure: VIDEO BRONCHOSCOPY WITH FLUORO;  Surgeon: Byrum, Robert S, MD;   Location: [facility];  Service: Cardiopulmonary;  Laterality: N/A;    REVIEW OF SYSTEMS:  Constitutional: positive for fatigue Eyes: negative Ears, nose, mouth, throat, and face: negative Respiratory: positive for dyspnea on exertion Cardiovascular: negative Gastrointestinal: negative Genitourinary:negative Integument/breast: negative Hematologic/lymphatic: negative Musculoskeletal:negative Neurological: negative Behavioral/Psych: negative Endocrine: negative Allergic/Immunologic: negative   PHYSICAL EXAMINATION: General appearance: alert, cooperative, fatigued and no distress Head: Normocephalic, without obvious abnormality, atraumatic Neck: no adenopathy, no JVD, supple, symmetrical, trachea midline and thyroid not enlarged, symmetric, no tenderness/mass/nodules Lymph nodes: Cervical, supraclavicular, and axillary nodes normal. Resp: clear to auscultation bilaterally Back: symmetric, no curvature. ROM normal. No CVA tenderness. Cardio: regular rate and rhythm, S1, S2 normal, no murmur, click, rub or gallop GI: soft, non-tender; bowel sounds normal; no masses,  no organomegaly Extremities: extremities normal, atraumatic, no cyanosis or edema Neurologic: Alert and oriented X 3, normal strength and tone. Normal symmetric reflexes. Normal coordination and gait  ECOG PERFORMANCE STATUS: 1 - Symptomatic but completely ambulatory  Blood pressure (!) 156/90, pulse (!) 101, temperature 97.8 F (36.6 C), temperature source Tympanic, resp. rate 18, height 5' 2" (1.575 m), weight 155 lb 1.6 oz (70.4 kg), SpO2 98 %.  LABORATORY DATA: Lab Results  Component Value Date   WBC 8.4 06/02/2020   HGB 13.7 06/02/2020   HCT 42.0 06/02/2020   MCV 90.5 06/02/2020   PLT 301 06/02/2020      Chemistry      Component Value Date/Time   NA 137 05/14/2020 1259   K 4.0 05/14/2020 1259   CL 101 05/14/2020 1259   CO2 27 05/14/2020 1259   BUN 12 05/14/2020 1259   CREATININE 0.80 05/14/2020 1259       Component Value Date/Time   CALCIUM 10.2 05/14/2020 1259   ALKPHOS 123 05/14/2020 1259   AST 18 05/14/2020 1259   ALT 17 05/14/2020 1259   BILITOT 0.3 05/14/2020 1259       RADIOGRAPHIC STUDIES: MR BRAIN W WO CONTRAST  Result Date: 05/20/2020 CLINICAL DATA:  Non-small-cell lung cancer, staging EXAM: MRI HEAD WITHOUT AND WITH CONTRAST TECHNIQUE: Multiplanar, multiecho pulse sequences of the brain and surrounding structures were obtained without and with intravenous contrast. CONTRAST:  7mL GADAVIST GADOBUTROL 1 MMOL/ML IV SOLN COMPARISON:  None. FINDINGS: Brain: Possible 2 mm enhancing lesion right medial occipital lobe best seen on axial postcontrast image 79 and coronal postcontrast image 5. Slight hyperintensity on FLAIR in this area. Alternately, this could represent a tortuous vessel. Otherwise no enhancing lesions identified. Ventricle size normal. Scattered small white matter hyperintensities bilaterally most consistent with chronic microvascular ischemia. Negative for acute infarct or hemorrhage. Vascular: Normal arterial flow voids. Skull and upper cervical spine: Negative Sinuses/Orbits: Paranasal sinuses clear.  Negative orbit Other: None IMPRESSION: 2 mm enhancement in the right medial occipital lobe could represent metastatic disease or tortuous vessel. Short term follow-up MRI without and with contrast recommended four weeks. No other enhancing lesions identified. Electronically Signed   By: Charles  Clark M.D.   On: 05/20/2020 10:02   NM   PET Image Initial (PI) Skull Base To Thigh  Result Date: 05/28/2020 CLINICAL DATA:  Initial treatment strategy for pulmonary mass. EXAM: NUCLEAR MEDICINE PET SKULL BASE TO THIGH TECHNIQUE: 7.9 mCi F-18 FDG was injected intravenously. Full-ring PET imaging was performed from the skull base to thigh after the radiotracer. CT data was obtained and used for attenuation correction and anatomic localization. Fasting blood glucose: 108 mg/dl  COMPARISON:  None available FINDINGS: Mediastinal blood pool activity: SUV max 2.9 Liver activity: SUV max NA NECK: No hypermetabolic lymph nodes in the neck. Incidental CT findings: none CHEST: Hypermetabolic round Mass centered in superior segment LEFT lower lobe extending to the LEFT hilum measures upwards 5.9 cm and has intense metabolic activity SUV max equal 14.7. Hypermetabolic subcarinal, LEFT lower paratracheal, prevascular and RIGHT lower paratracheal nodes. Example AP window node with SUV max equal 5.2. Subcarinal node with SUV max equal 8.9. Hypermetabolic contralateral supraclavicular node measures 7 mm (image 40) with SUV max equal 6.7. Additionally there is multiple hypermetabolic LEFT axillary lymph nodes with SUV max equal 11.4. Finally, hypermetabolic lymph node in the inferior LEFT breast tissue with SUV max equal 7.5. Incidental CT findings: Small pericardial effusion ABDOMEN/PELVIS: Adrenal glands are normal. No evidence of liver metastasis. No hypermetabolic lymph nodes in the abdomen pelvis. Incidental CT findings: Post hysterectomy SKELETON: Solitary hypermetabolic lesion in the lumbar spine at L1 with SUV max equal 10.5. Subtle sclerosis in the vertebral body at this level. (Image 140) Incidental CT findings: none IMPRESSION: 1. Large hypermetabolic 6 cm LEFT lobe mass consistent primary bronchogenic carcinoma. 2. Hypermetabolic ipsilateral and contralateral nodal metastasis including RIGHT supraclavicular nodal metastasis. 3. Hypermetabolic LEFT axillary nodal metastasis and subcutaneous metastasis inferior to LEFT breast. 4. 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 Electronically Signed   By: Stewart  Edmunds M.D.   On: 05/28/2020 19:53   US BREAST LTD UNI LEFT INC AXILLA  Result Date: 06/01/2020 CLINICAL DATA:  Patient with a known primary bronchogenic carcinoma for which a PET-CT was recently performed. On the recent PET-CT, a hypermetabolic mass was identified in the  lower LEFT breast and hypermetabolic lymph nodes were identified in the LEFT axilla. EXAM: DIGITAL DIAGNOSTIC BILATERAL MAMMOGRAM WITH CAD AND TOMO ULTRASOUND LEFT BREAST COMPARISON:  Previous exams including recent PET-CT dated 05/28/2020 and most recent bilateral screening mammogram dated 05/21/2013 ACR Breast Density Category b: There are scattered areas of fibroglandular density. FINDINGS: An irregular mass is confirmed within the lower outer quadrant of the LEFT breast, at posterior depth, measuring approximately 1.5 cm greatest dimension. No new dominant masses, suspicious calcifications or secondary signs of malignancy elsewhere within the LEFT breast. There are no new dominant masses, suspicious calcifications or secondary signs of malignancy within the RIGHT breast. Mammographic images were processed with CAD. Targeted ultrasound is performed, showing an irregular hypoechoic mass in the LEFT breast at the 4 o'clock axis, 6 cm from the nipple, measuring 1.6 x 0.9 x 1.2 cm, corresponding to the mammographic findings and PET-CT findings. An additional hypoechoic area is identified in the LEFT breast at the 4 o'clock axis, 3 cm from the nipple, measuring 7 mm greatest dimension, compatible with an associated satellite lesion. There are at least 6 enlarged/morphologically abnormal lymph nodes in the LEFT axilla. IMPRESSION: 1. Irregular mass in the LEFT breast at the 4 o'clock axis, 6 cm from the nipple, measuring 1.6 cm, corresponding to the hypermetabolic mass identified on recent PET-CT. Ultrasound-guided biopsy is recommended. 2. Additional hypoechoic area in the LEFT breast   at the 4 o'clock axis, 3 cm from nipple, measuring 7 mm, suspected satellite lesion. 3. At least 6 enlarged/morphologically abnormal lymph nodes in the LEFT axilla, corresponding to the axillary metastases identified on recent PET-CT. Ultrasound-guided biopsy is recommended. 4. No evidence of malignancy within the RIGHT breast.  RECOMMENDATION: 1. Ultrasound-guided biopsy for the LEFT breast mass at the 4 o'clock axis, 6 cm from the nipple, measuring 1.6 cm, corresponding to the hypermetabolic mass identified on recent PET-CT. 2. If pathology result is a primary breast cancer and if breast conservation surgery is considered, an additional ultrasound-guided biopsy would be necessary for the additional hypoechoic area in the LEFT breast at the 4 o'clock axis, 3 cm from the nipple, measuring 7 mm. If pathology result is metastatic lung cancer, with no breast surgery considered, the additional hypoechoic area can be considered an associated satellite lesion. 3. Ultrasound-guided biopsy of 1 of the enlarged/morphologically abnormal lymph nodes in the LEFT axilla. Ultrasound-guided biopsies are scheduled for December 22nd. I have discussed the findings and recommendations with the patient. If applicable, a reminder letter will be sent to the patient regarding the next appointment. BI-RADS CATEGORY  5: Highly suggestive of malignancy. Electronically Signed   By: Franki Cabot M.D.   On: 06/01/2020 15:47   MM DIAG BREAST TOMO BILATERAL  Result Date: 06/01/2020 CLINICAL DATA:  Patient with a known primary bronchogenic carcinoma for which a PET-CT was recently performed. On the recent PET-CT, a hypermetabolic mass was identified in the lower LEFT breast and hypermetabolic lymph nodes were identified in the LEFT axilla. EXAM: DIGITAL DIAGNOSTIC BILATERAL MAMMOGRAM WITH CAD AND TOMO ULTRASOUND LEFT BREAST COMPARISON:  Previous exams including recent PET-CT dated 05/28/2020 and most recent bilateral screening mammogram dated 05/21/2013 ACR Breast Density Category b: There are scattered areas of fibroglandular density. FINDINGS: An irregular mass is confirmed within the lower outer quadrant of the LEFT breast, at posterior depth, measuring approximately 1.5 cm greatest dimension. No new dominant masses, suspicious calcifications or secondary signs  of malignancy elsewhere within the LEFT breast. There are no new dominant masses, suspicious calcifications or secondary signs of malignancy within the RIGHT breast. Mammographic images were processed with CAD. Targeted ultrasound is performed, showing an irregular hypoechoic mass in the LEFT breast at the 4 o'clock axis, 6 cm from the nipple, measuring 1.6 x 0.9 x 1.2 cm, corresponding to the mammographic findings and PET-CT findings. An additional hypoechoic area is identified in the LEFT breast at the 4 o'clock axis, 3 cm from the nipple, measuring 7 mm greatest dimension, compatible with an associated satellite lesion. There are at least 6 enlarged/morphologically abnormal lymph nodes in the LEFT axilla. IMPRESSION: 1. Irregular mass in the LEFT breast at the 4 o'clock axis, 6 cm from the nipple, measuring 1.6 cm, corresponding to the hypermetabolic mass identified on recent PET-CT. Ultrasound-guided biopsy is recommended. 2. Additional hypoechoic area in the LEFT breast at the 4 o'clock axis, 3 cm from nipple, measuring 7 mm, suspected satellite lesion. 3. At least 6 enlarged/morphologically abnormal lymph nodes in the LEFT axilla, corresponding to the axillary metastases identified on recent PET-CT. Ultrasound-guided biopsy is recommended. 4. No evidence of malignancy within the RIGHT breast. RECOMMENDATION: 1. Ultrasound-guided biopsy for the LEFT breast mass at the 4 o'clock axis, 6 cm from the nipple, measuring 1.6 cm, corresponding to the hypermetabolic mass identified on recent PET-CT. 2. If pathology result is a primary breast cancer and if breast conservation surgery is considered, an additional ultrasound-guided biopsy would  be necessary for the additional hypoechoic area in the LEFT breast at the 4 o'clock axis, 3 cm from the nipple, measuring 7 mm. If pathology result is metastatic lung cancer, with no breast surgery considered, the additional hypoechoic area can be considered an associated satellite  lesion. 3. Ultrasound-guided biopsy of 1 of the enlarged/morphologically abnormal lymph nodes in the LEFT axilla. Ultrasound-guided biopsies are scheduled for December 22nd. I have discussed the findings and recommendations with the patient. If applicable, a reminder letter will be sent to the patient regarding the next appointment. BI-RADS CATEGORY  5: Highly suggestive of malignancy. Electronically Signed   By: Stan  Maynard M.D.   On: 06/01/2020 15:47    ASSESSMENT AND PLAN: This is a very pleasant 69 years old white female diagnosed with stage IV (T3, N2, M1C) non-small cell lung cancer, adenocarcinoma presented with large left lower lobe lung mass in addition to mediastinal and left axillary lymphadenopathy and metastatic bone disease as well as left pleural effusion diagnosed in November 2021. 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 I had a lengthy discussion with the patient and her husband today about her current condition and treatment options.  I recommended for the patient treatment with targeted therapy with dabrafenib 150 mg p.o. twice daily in addition to trametinib 2 mg p.o. nightly.  I discussed with the patient the adverse effect of this treatment and she will meet with the pharmacist for oral oncolytic for more education and also to help her getting the medication approved. I strongly advised the patient to avoid any herbal supplements that could interact with her treatment at this point. For the suspicious 2 mm lesion in the brain, we will consider her for repeat MRI of the brain in few months. The patient will come back for follow-up visit in around 3 weeks for evaluation and repeat blood work. For the suspicious left breast mass, she is scheduled to have a biopsy tomorrow to rule out any other malignancy in the breast. She was advised to call immediately if she has any other concerning symptoms in the interval. The patient voices  understanding of current disease status and treatment options and is in agreement with the current care plan.  All questions were answered. The patient knows to call the clinic with any problems, questions or concerns. We can certainly see the patient much sooner if necessary.  The total time spent in the appointment was 40 minutes.  Disclaimer: This note was dictated with voice recognition software. Similar sounding words can inadvertently be transcribed and may not be corrected upon review.       

## 2020-06-02 NOTE — Telephone Encounter (Signed)
Oral Chemotherapy Pharmacist Encounter   I spoke with patient for overview of: Tafinlar and Mekinist combination for the treatment of BRAF mutation positive stage IV NSCLC, treatment duration until disease progression or unacceptable toxicity.   Counseled patient on administration, dosing, side effects, monitoring, drug-food interactions, safe handling, storage, and disposal.  Patient will take Tafinlar 10m capsules, 2 capsules (1581mtotal) by mouth twice daily.   Patient will take Mekinist 59m34mablets,1 tablet by mouth once daily.   These will be taken on an empty stomach, 1 hour before or 2 hours after a meal.   Patient will take Tafinlar and Mekinist in the morning before breakfast, and will take Tafinlar again in the evening before dinner.  Mekinist will be stored in the refrigerator.   Adverse effects include but are not limited to:  Rash/pruritis, diarrhea, edema, change in electrolytes, decreased WBC count and Hgb, increased LFTs, fatigue, and headache.    Patient instructed to interrupt therapy for any fever over 101.86F and to alert the office.   Patient will obtain anti diarrheal and alert the office of 4 or more loose stools above baseline.  Reviewed with patient importance of keeping a medication schedule and plan for any missed doses. No barriers to medication adherence identified. Patient did express that she wanted to conduct her own research on the medications prior to starting as she typically prefers other options such as herbals and accupuncture. Patient educated on the mechanism of the medications and the data supporting their use.   Medication reconciliation performed and medication/allergy list updated.  All questions answered.  Ms. Tetreault and her husband voiced understanding and appreciation.   Medication education handout provided to patient. Patient knows to call the office with questions or concerns. Oral Chemotherapy Clinic phone number provided to  patient.   Patient assistance application completed. Oral chemotherapy clinic will follow up medication acquisition.    AndEddie CandleharmD, [id number] Hematology/Oncology Pharmacy Resident  06/02/2020   11:23 AM Oral Oncology Clinic [phone]

## 2020-06-03 ENCOUNTER — Other Ambulatory Visit: Payer: Medicare Other

## 2020-06-04 ENCOUNTER — Telehealth: Payer: Self-pay

## 2020-06-04 ENCOUNTER — Encounter (HOSPITAL_COMMUNITY): Payer: Self-pay | Admitting: Internal Medicine

## 2020-06-04 NOTE — Telephone Encounter (Signed)
-----   Message from Curt Bears, MD sent at 06/04/2020 12:15 PM EST ----- Yes. It is ok for her to continue with her current cough medications. It will not get better except when the cancer gets better. Thank you. ----- Message ----- From: Tami Lin, RN Sent: 06/04/2020  11:15 AM EST To: Curt Bears, MD  Patient is taking Nyquil nightly and sometimes takes Dayquil 2-3 times a day due to a cough. She states she can "fill the phlegm coming out of her lungs but it does not come up into her throat." Patient is currently taking Protectival that she says came from a research center. Patient said she has not decided if she is going to take the chemo pills. She wants to know if she is ok to continue taking Nyquil and Dayquil or if you think she needs a prescription for cough medicine.  [name]

## 2020-06-04 NOTE — Telephone Encounter (Signed)
Called patient and let her know Dr. Worthy Flank response to her question below. Patient verbalized understanding.

## 2020-06-08 ENCOUNTER — Encounter (HOSPITAL_COMMUNITY): Payer: Self-pay | Admitting: Internal Medicine

## 2020-06-08 NOTE — Telephone Encounter (Signed)
Patient is approved for Tafinlar and Mekinist at no charge 06/04/20-06/04/21.   [facility] Phone [phone] Fax [phone] 06/08/2020 2:34 PM

## 2020-06-10 ENCOUNTER — Telehealth: Payer: Self-pay | Admitting: Internal Medicine

## 2020-06-10 NOTE — Telephone Encounter (Signed)
Rescheduled appointment per 12/29 schedule message due to biopsy reschedule. Patient is aware of changes.

## 2020-06-19 ENCOUNTER — Telehealth: Payer: Self-pay | Admitting: *Deleted

## 2020-06-19 NOTE — Telephone Encounter (Signed)
I called Tami Stone to check on how she was doing with her treatment.  She states she is not taking her "chemo pill".  I listened as she explained.  She states she is ready to take the medication.  I will reach out to oral biologic team with an update.  I will have them call patient to explain next steps.

## 2020-06-22 ENCOUNTER — Inpatient Hospital Stay: Payer: Medicare Other

## 2020-06-22 ENCOUNTER — Inpatient Hospital Stay: Payer: Medicare Other | Admitting: Internal Medicine

## 2020-06-23 ENCOUNTER — Telehealth: Payer: Self-pay | Admitting: *Deleted

## 2020-06-23 NOTE — Telephone Encounter (Signed)
I received an update from Dr. Julien Nordmann regarding patient's request for another scan. He states this is not necessary at this time.  I called Ms. Blume to give an update to.  I was unable to reach but did leave vm message with an update. I also left my name and phone number to call.

## 2020-06-26 ENCOUNTER — Other Ambulatory Visit: Payer: Self-pay

## 2020-06-26 ENCOUNTER — Ambulatory Visit
Admission: RE | Admit: 2020-06-26 | Discharge: 2020-06-26 | Disposition: A | Discharge status: Home / Self Care | Payer: Medicare Other | Source: Ambulatory Visit | Attending: Radiation Oncology | Admitting: Radiation Oncology

## 2020-06-26 DIAGNOSIS — C50812 Malignant neoplasm of overlapping sites of left female breast: Secondary | ICD-10-CM

## 2020-06-26 DIAGNOSIS — R918 Other nonspecific abnormal finding of lung field: Secondary | ICD-10-CM

## 2020-06-26 DIAGNOSIS — N6320 Unspecified lump in the left breast, unspecified quadrant: Secondary | ICD-10-CM

## 2020-06-26 IMAGING — US US BREAST BX W LOC DEV 1ST LESION IMG BX SPEC US GUIDE*L*
1 series · 8 of 8 positions shown · non-contrast
Comparison: Previous exam(s).
COMPARISON: Previous exam(s).

Addendum:
CLINICAL DATA: Patient presents for ultrasound-guided core needle
biopsy of a suspicious mass over the 4 o'clock position of the left
breast 6 cm from the nipple as well as 1 of 6 abnormal left axillary
lymph node. Known recent diagnosis of lung cancer.

EXAM:
ULTRASOUND GUIDED LEFT BREAST CORE NEEDLE BIOPSY

[Series 1: us breast bx w loc dev 1st lesion img bx spec us g · 0.06mm/px · 8 of 8 slices shown]
[im 1/8]
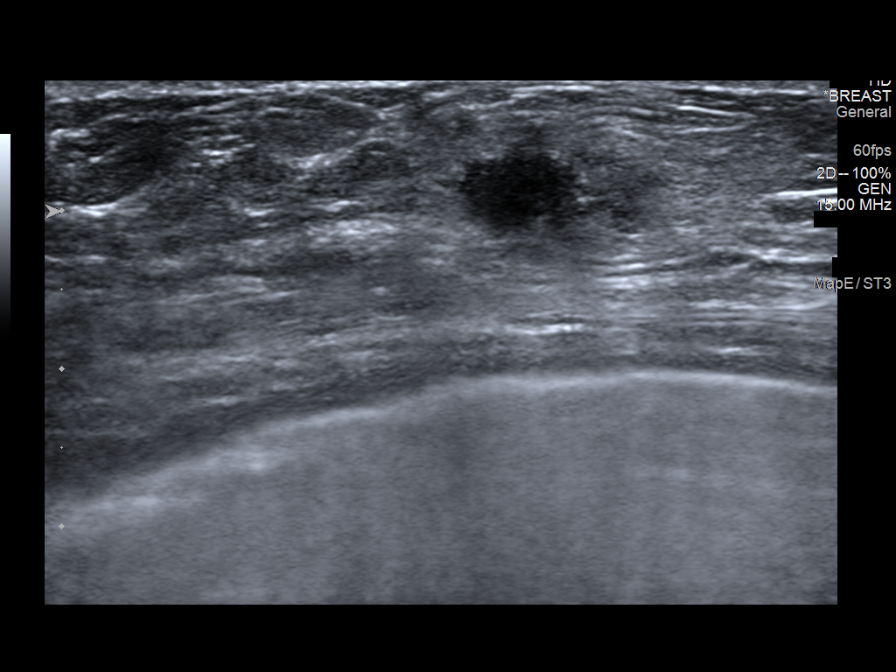
[im 2/8]
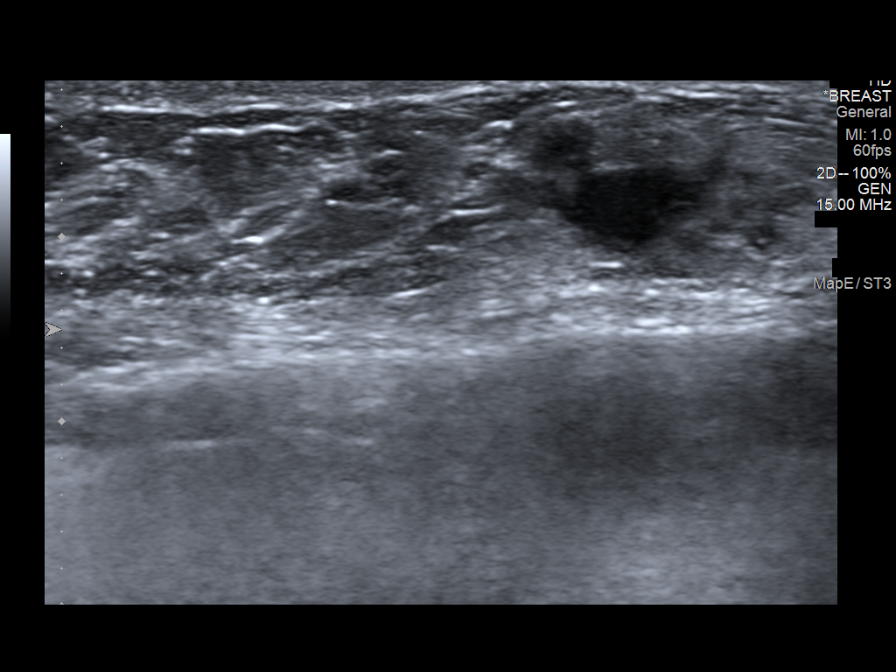
[im 3/8]
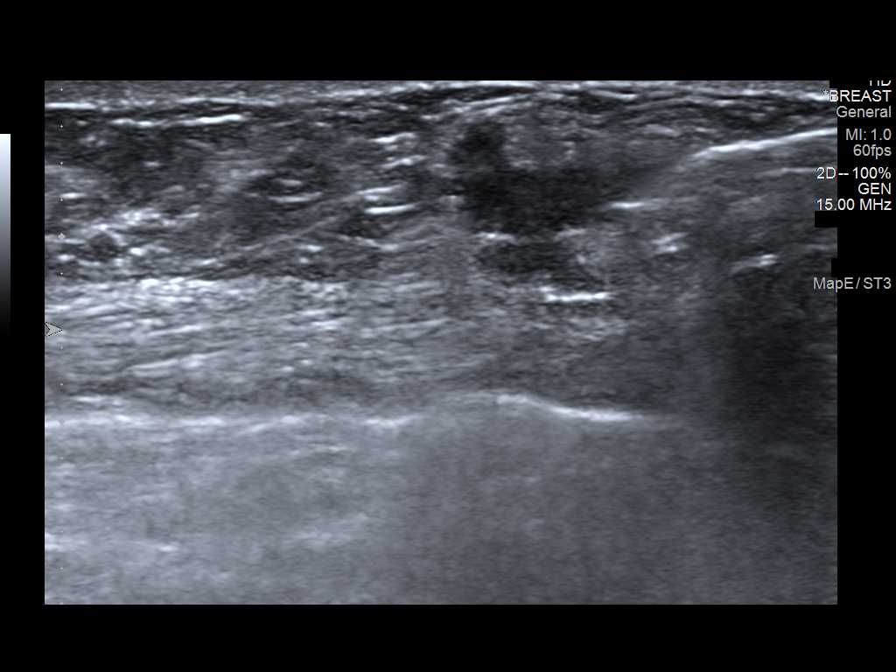
[im 4/8]
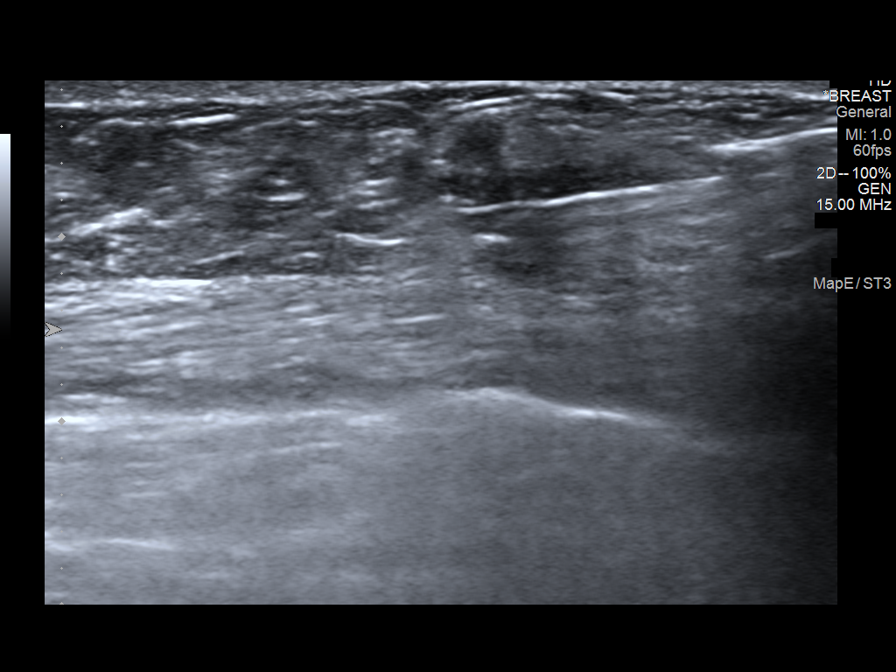
[im 5/8]
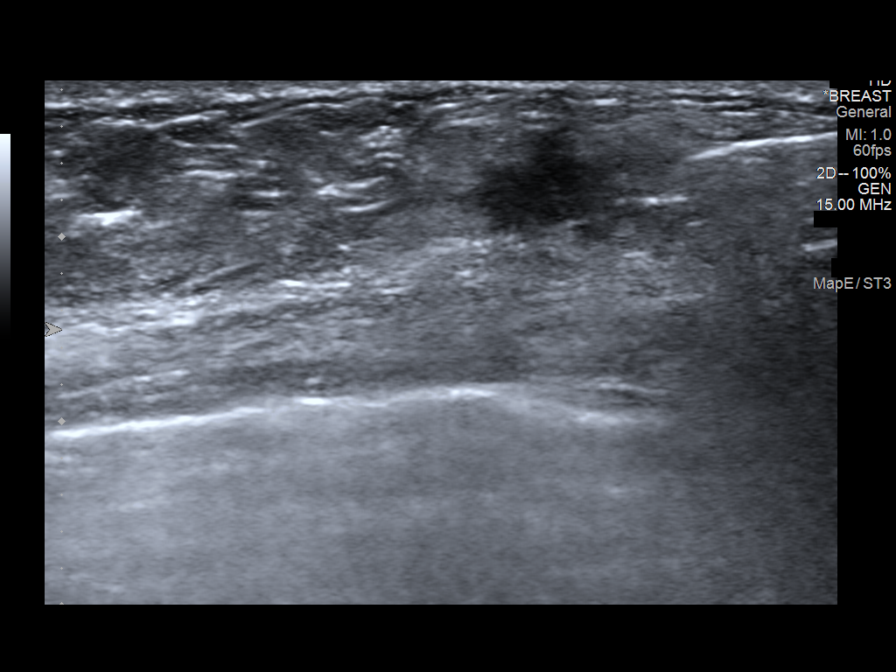
[im 6/8]
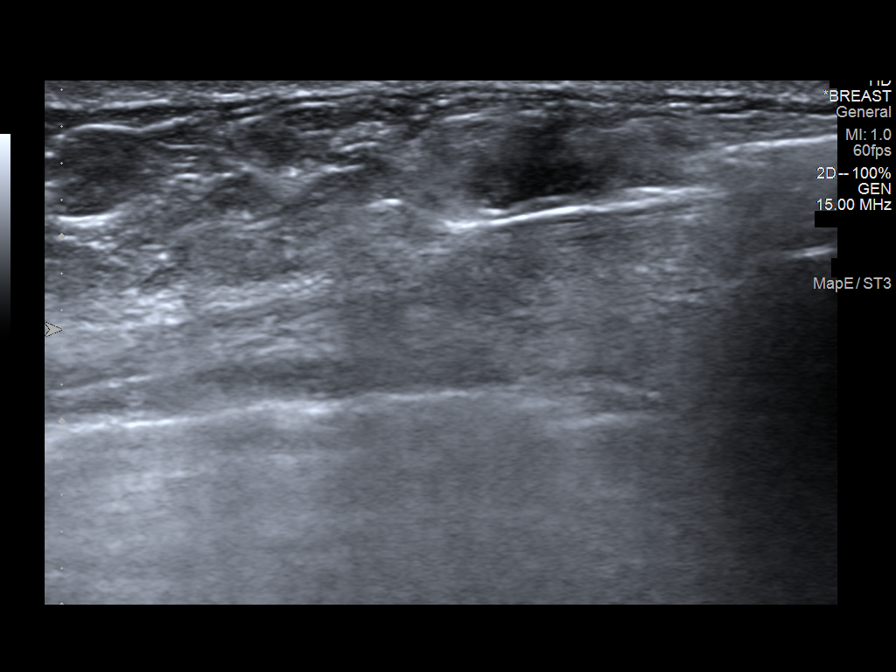
[im 7/8]
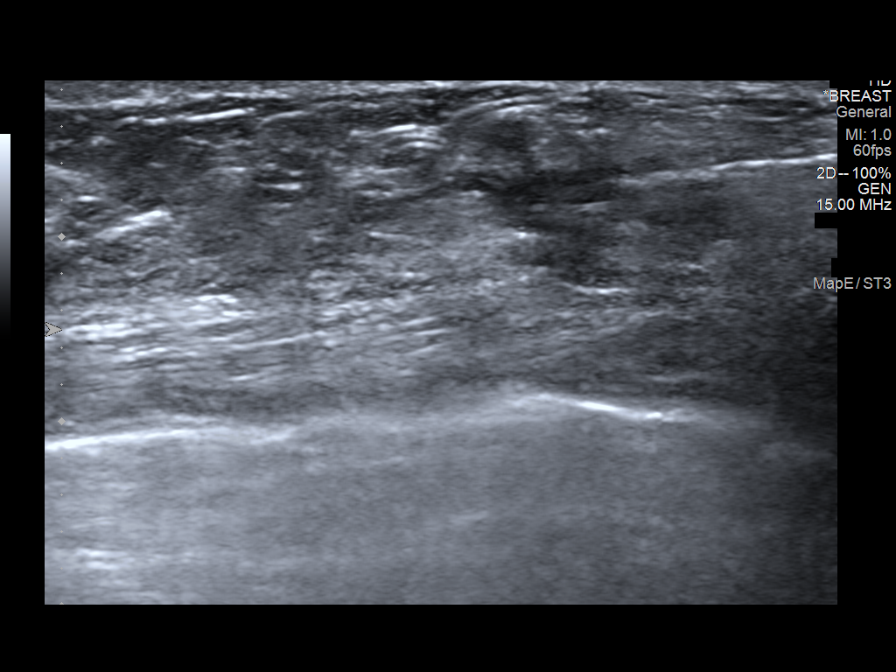
[im 8/8]
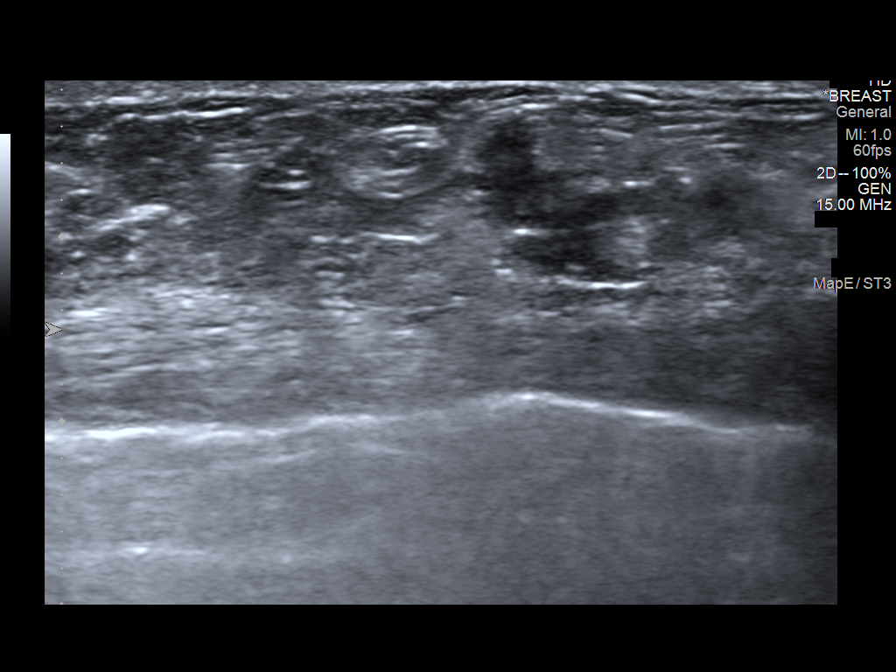

[8 of 8 positions shown; findings below may reference images not displayed]



1.  Lesion quadrant: Left outer lower quadrant.

Using sterile technique and 1% Lidocaine as local anesthetic, under
direct ultrasound visualization, a 12 gauge TECO device was
used to perform biopsy of the targeted mass at the 4 o'clock
position 6 cm from the nipple using a inferior to superior approach.
At the conclusion of the procedure ribbon shaped tissue marker clip
was deployed into the biopsy cavity. Follow up 2 view mammogram was
performed and dictated separately.

2.  Lesion quadrant: Left axilla.

Using sterile technique and 1% Lidocaine as local anesthetic, under
direct ultrasound visualization, a 14 gauge TECO device was
used to perform biopsy of the targeted abnormal left axillary lymph
node using a inferior to superior approach. At the conclusion of the
procedure TECO tissue marker clip was deployed into the biopsy
cavity. Follow up 2 view mammogram was performed and dictated
separately.
IMPRESSION: Ultrasound guided biopsy of suspicious left breast mass as well as
abnormal left axillary lymph node. No apparent complications.

ADDENDUM:
Pathology revealed GRADE II INVASIVE DUCTAL CARCINOMA of the LEFT
breast, 4 o'clock, [G2]. This was found to be concordant by Dr.
TECO.

Pathology revealed METASTATIC CARCINOMA of LEFT axillary lymph node.
The immunophenotype is compatible with origin from the breast. This
was found to be concordant by Dr. TECO.

Pathology results and recommendation were discussed with the patient
by telephone. The patient reported doing well after the biopsies
with tenderness at the sites. Post biopsy instructions and care were
reviewed and questions were answered. The patient was encouraged to
call The [REDACTED] for any additional
concerns.

The patient has a current diagnosis of stage IV non-small cell lung
cancer, adenocarcinoma and should follow her outlined treatment plan
with Dr. TECO. After consultation with TECO
Oncology RN and TECO Oncology Nurse Navigator, patient was
referred to [REDACTED] [REDACTED] at
[REDACTED] on [DATE].

If considering breast conservation treatment, then recommend
ultrasound-guided biopsy of additional small mass LEFT 11 o'clock,
[G2].

Pathology results reported by TECO RN on [DATE].



1.  Lesion quadrant: Left outer lower quadrant.

Using sterile technique and 1% Lidocaine as local anesthetic, under
direct ultrasound visualization, a 12 gauge TECO device was
used to perform biopsy of the targeted mass at the 4 o'clock
position 6 cm from the nipple using a inferior to superior approach.
At the conclusion of the procedure ribbon shaped tissue marker clip
was deployed into the biopsy cavity. Follow up 2 view mammogram was
performed and dictated separately.

2.  Lesion quadrant: Left axilla.

Using sterile technique and 1% Lidocaine as local anesthetic, under
direct ultrasound visualization, a 14 gauge TECO device was
used to perform biopsy of the targeted abnormal left axillary lymph
node using a inferior to superior approach. At the conclusion of the
procedure TECO tissue marker clip was deployed into the biopsy
cavity. Follow up 2 view mammogram was performed and dictated
separately.
IMPRESSION: Ultrasound guided biopsy of suspicious left breast mass as well as
abnormal left axillary lymph node. No apparent complications.

## 2020-06-26 IMAGING — US US AXILLARY NODE CORE BIOPSY LEFT
1 series · 9 of 9 positions shown · non-contrast
Comparison: Previous exam(s).
COMPARISON: Previous exam(s).

Addendum:
CLINICAL DATA: Patient presents for ultrasound-guided core needle
biopsy of a suspicious mass over the 4 o'clock position of the left
breast 6 cm from the nipple as well as 1 of 6 abnormal left axillary
lymph node. Known recent diagnosis of lung cancer.

EXAM:
ULTRASOUND GUIDED LEFT BREAST CORE NEEDLE BIOPSY

[Series 1: us axillary node core biopsy left · 0.07mm/px · 9 of 9 slices shown]
[im 1/9]
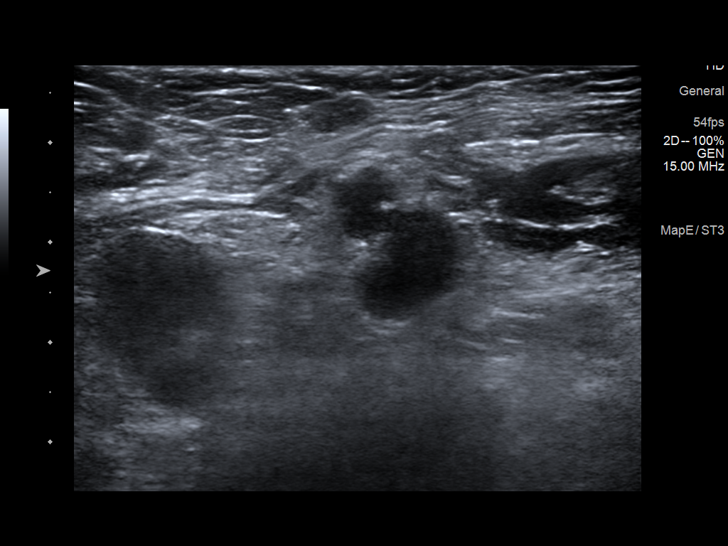
[im 2/9]
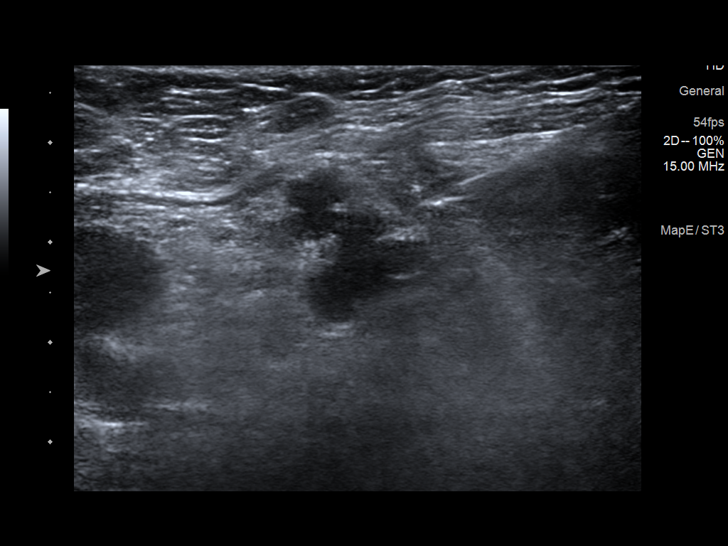
[im 3/9]
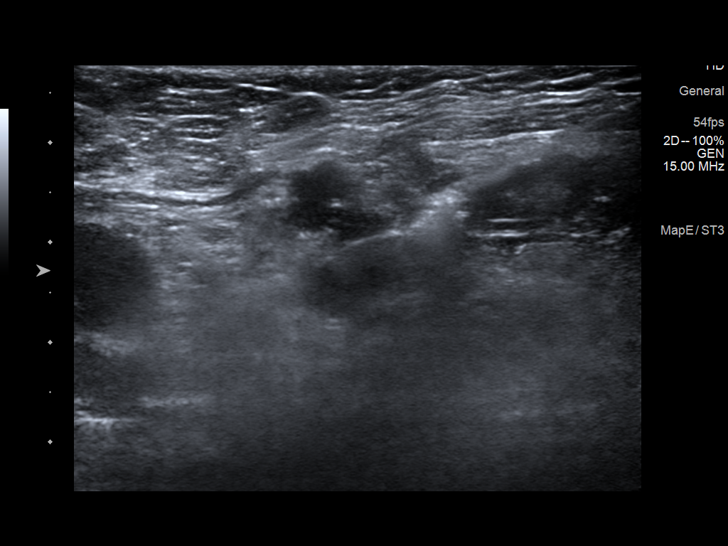
[im 4/9]
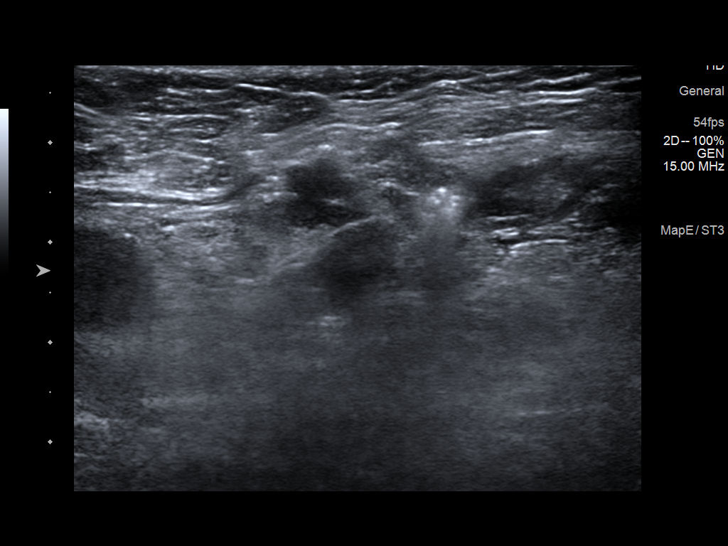
[im 5/9]
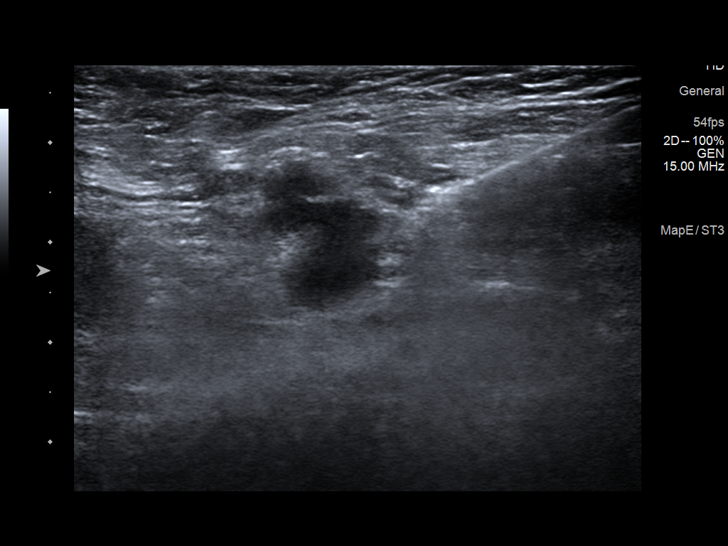
[im 6/9]
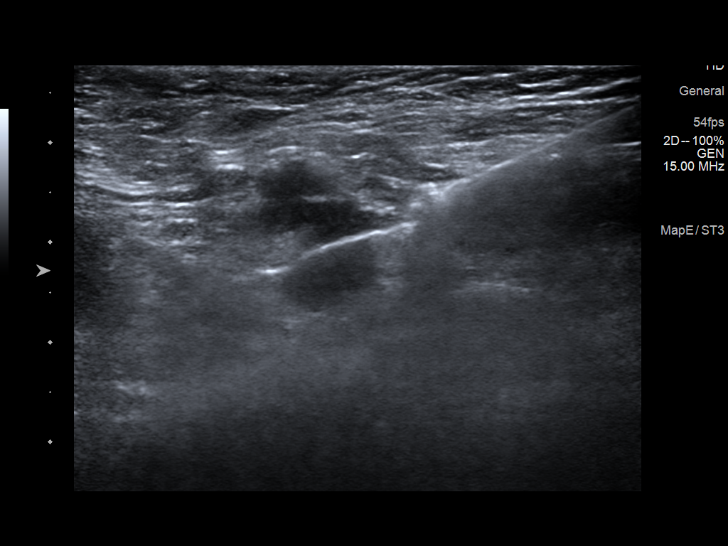
[im 7/9]
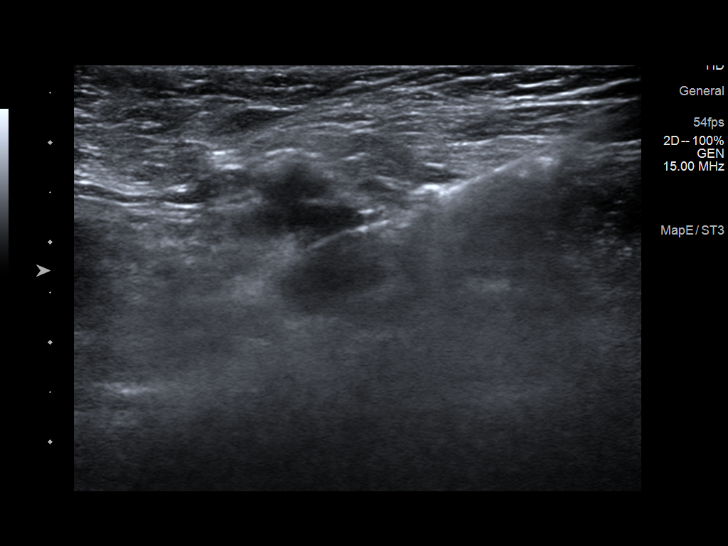
[im 8/9]
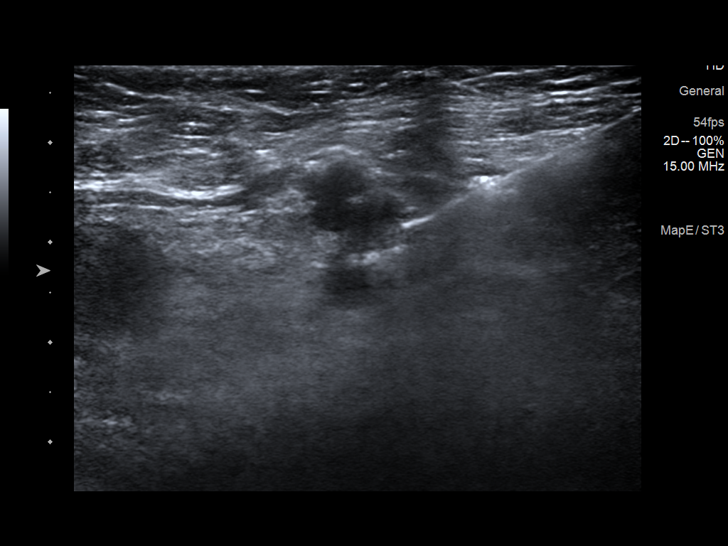
[im 9/9]
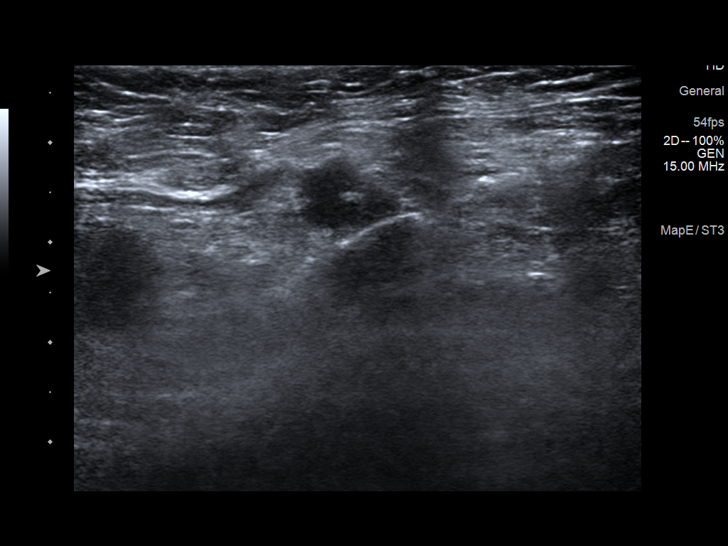

[9 of 9 positions shown; findings below may reference images not displayed]



1.  Lesion quadrant: Left outer lower quadrant.

Using sterile technique and 1% Lidocaine as local anesthetic, under
direct ultrasound visualization, a 12 gauge TECO device was
used to perform biopsy of the targeted mass at the 4 o'clock
position 6 cm from the nipple using a inferior to superior approach.
At the conclusion of the procedure ribbon shaped tissue marker clip
was deployed into the biopsy cavity. Follow up 2 view mammogram was
performed and dictated separately.

2.  Lesion quadrant: Left axilla.

Using sterile technique and 1% Lidocaine as local anesthetic, under
direct ultrasound visualization, a 14 gauge TECO device was
used to perform biopsy of the targeted abnormal left axillary lymph
node using a inferior to superior approach. At the conclusion of the
procedure TECO tissue marker clip was deployed into the biopsy
cavity. Follow up 2 view mammogram was performed and dictated
separately.
IMPRESSION: Ultrasound guided biopsy of suspicious left breast mass as well as
abnormal left axillary lymph node. No apparent complications.

ADDENDUM:
Pathology revealed GRADE II INVASIVE DUCTAL CARCINOMA of the LEFT
breast, 4 o'clock, [G2]. This was found to be concordant by Dr.
TECO.

Pathology revealed METASTATIC CARCINOMA of LEFT axillary lymph node.
The immunophenotype is compatible with origin from the breast. This
was found to be concordant by Dr. TECO.

Pathology results and recommendation were discussed with the patient
by telephone. The patient reported doing well after the biopsies
with tenderness at the sites. Post biopsy instructions and care were
reviewed and questions were answered. The patient was encouraged to
call The [REDACTED] for any additional
concerns.

The patient has a current diagnosis of stage IV non-small cell lung
cancer, adenocarcinoma and should follow her outlined treatment plan
with Dr. TECO. After consultation with TECO
Oncology RN and TECO Oncology Nurse Navigator, patient was
referred to [REDACTED] [REDACTED] at
[REDACTED] on [DATE].

If considering breast conservation treatment, then recommend
ultrasound-guided biopsy of additional small mass LEFT 11 o'clock,
[G2].

Pathology results reported by TECO RN on [DATE].



1.  Lesion quadrant: Left outer lower quadrant.

Using sterile technique and 1% Lidocaine as local anesthetic, under
direct ultrasound visualization, a 12 gauge TECO device was
used to perform biopsy of the targeted mass at the 4 o'clock
position 6 cm from the nipple using a inferior to superior approach.
At the conclusion of the procedure ribbon shaped tissue marker clip
was deployed into the biopsy cavity. Follow up 2 view mammogram was
performed and dictated separately.

2.  Lesion quadrant: Left axilla.

Using sterile technique and 1% Lidocaine as local anesthetic, under
direct ultrasound visualization, a 14 gauge TECO device was
used to perform biopsy of the targeted abnormal left axillary lymph
node using a inferior to superior approach. At the conclusion of the
procedure TECO tissue marker clip was deployed into the biopsy
cavity. Follow up 2 view mammogram was performed and dictated
separately.
IMPRESSION: Ultrasound guided biopsy of suspicious left breast mass as well as
abnormal left axillary lymph node. No apparent complications.

## 2020-06-26 IMAGING — MG MM BREAST LOCALIZATION CLIP
6 series · 6 of 18 positions shown · non-contrast
Comparison: Previous exam(s).

CLINICAL DATA: Patient is post ultrasound-guided core needle biopsy
of a suspicious mass over the outer lower left breast as well as
biopsy of an abnormal left axillary lymph node.

EXAM:
DIAGNOSTIC LEFT MAMMOGRAM POST ULTRASOUND BIOPSY

[L CC synth-2D]
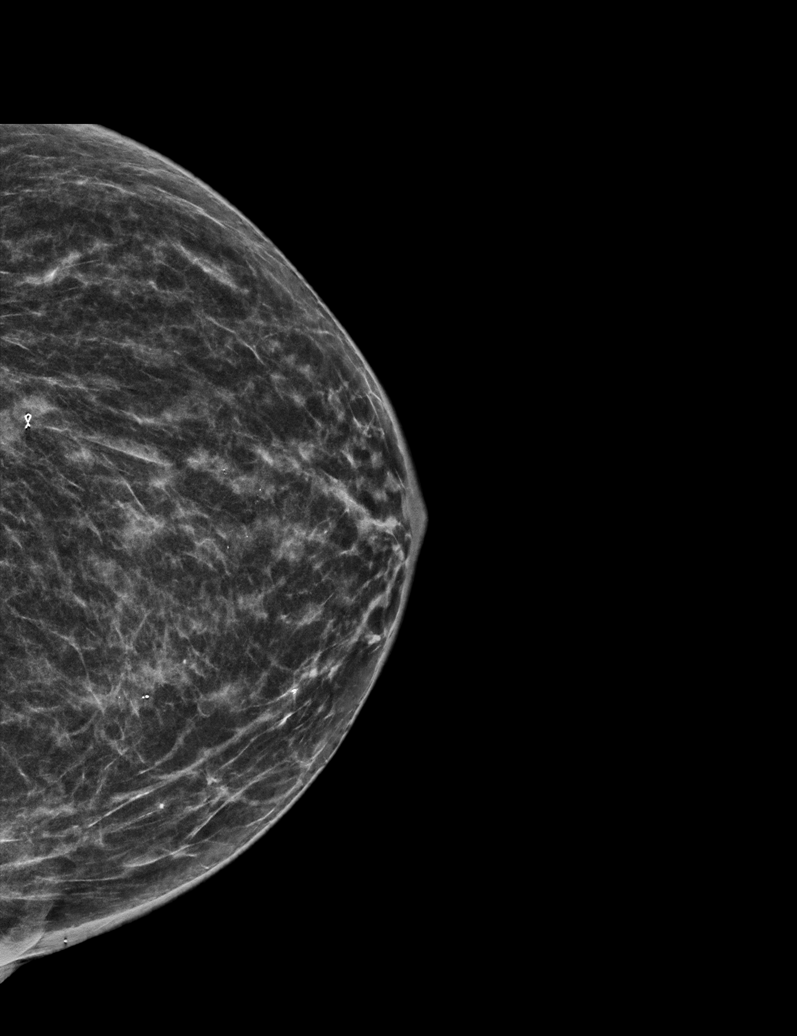

[L MLO synth-2D (1 of 2)]
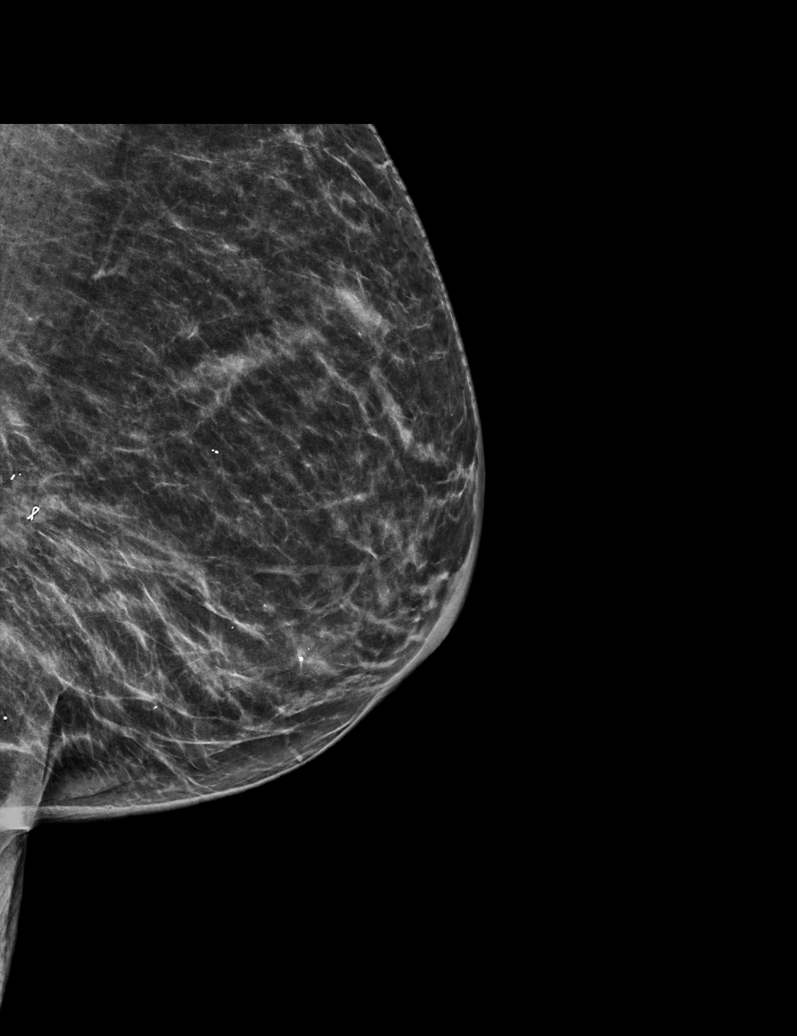

[L MLO synth-2D (2 of 2)]
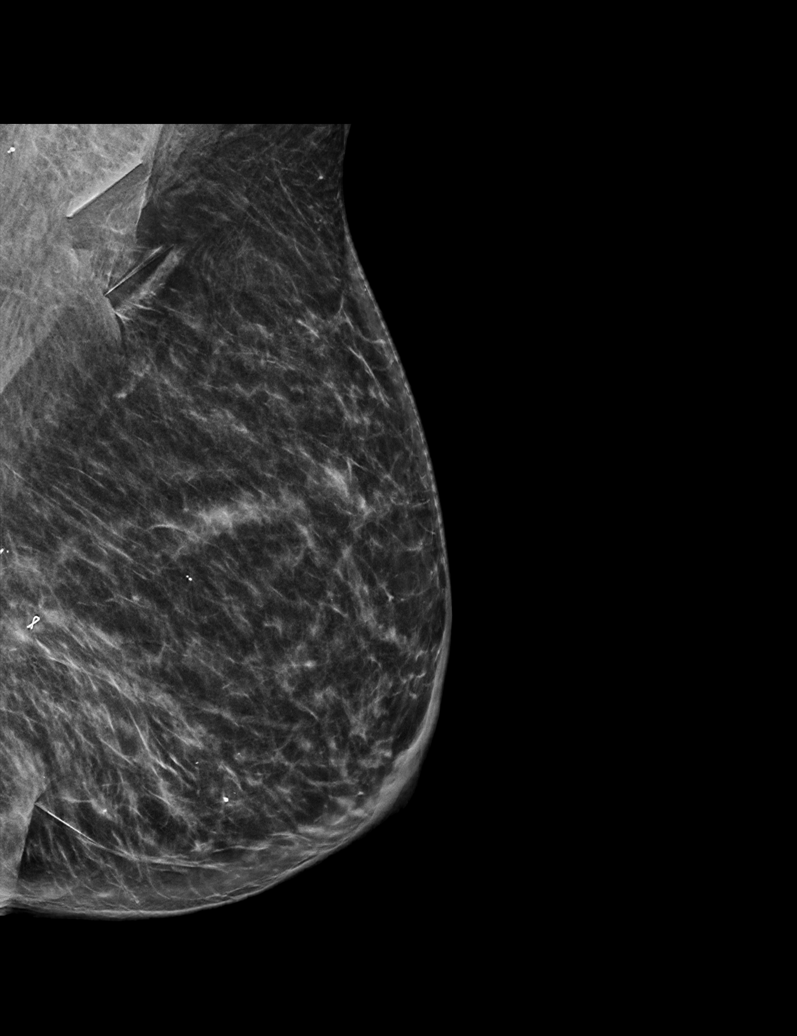

[L MLO tomo (1 of 2) · tomo slice 29/56.0]
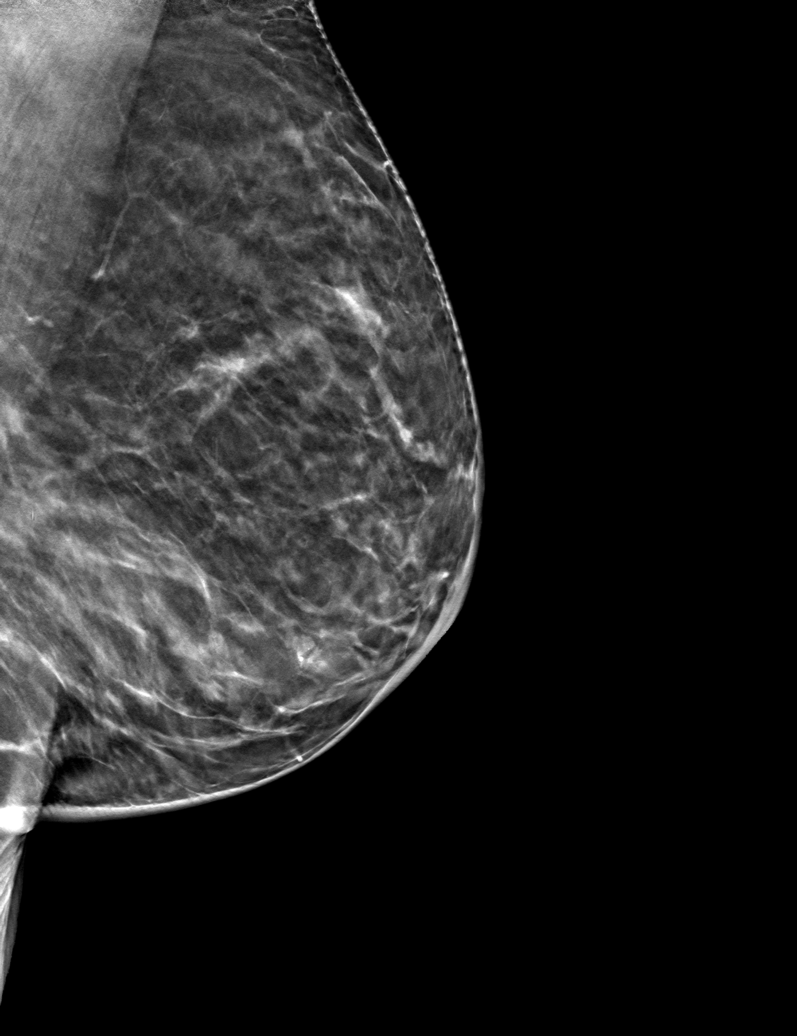

[L CC tomo · tomo slice 27/54.0]
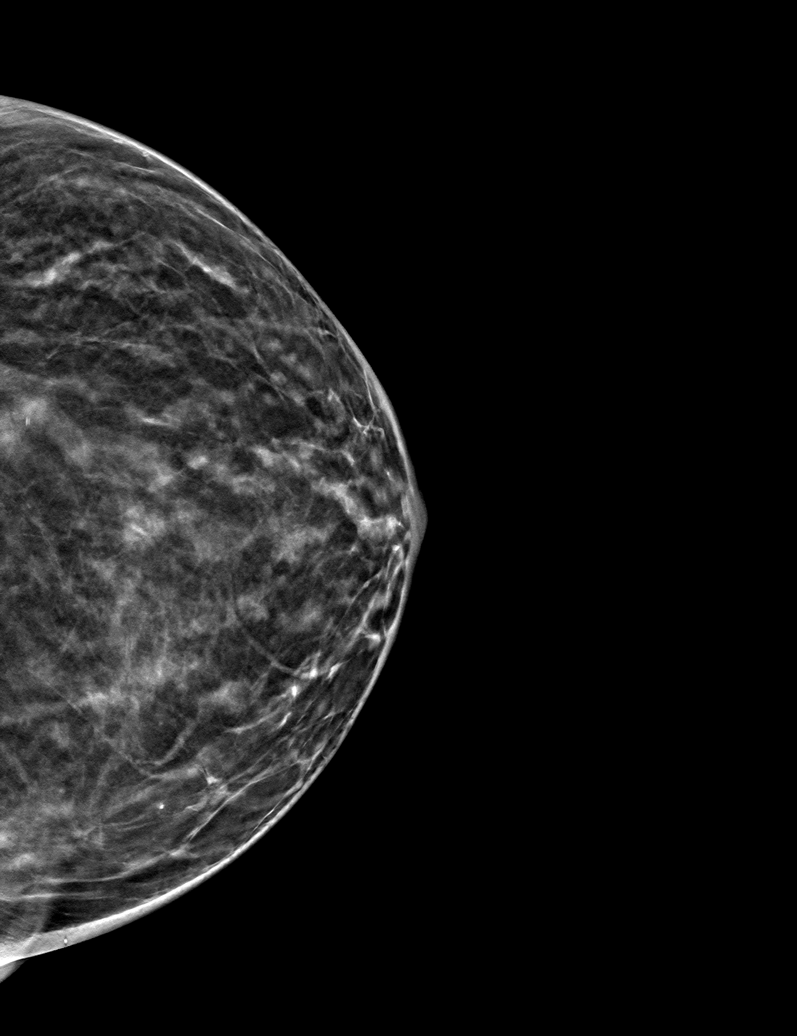

[L MLO tomo (2 of 2) · tomo slice 33/65.0]
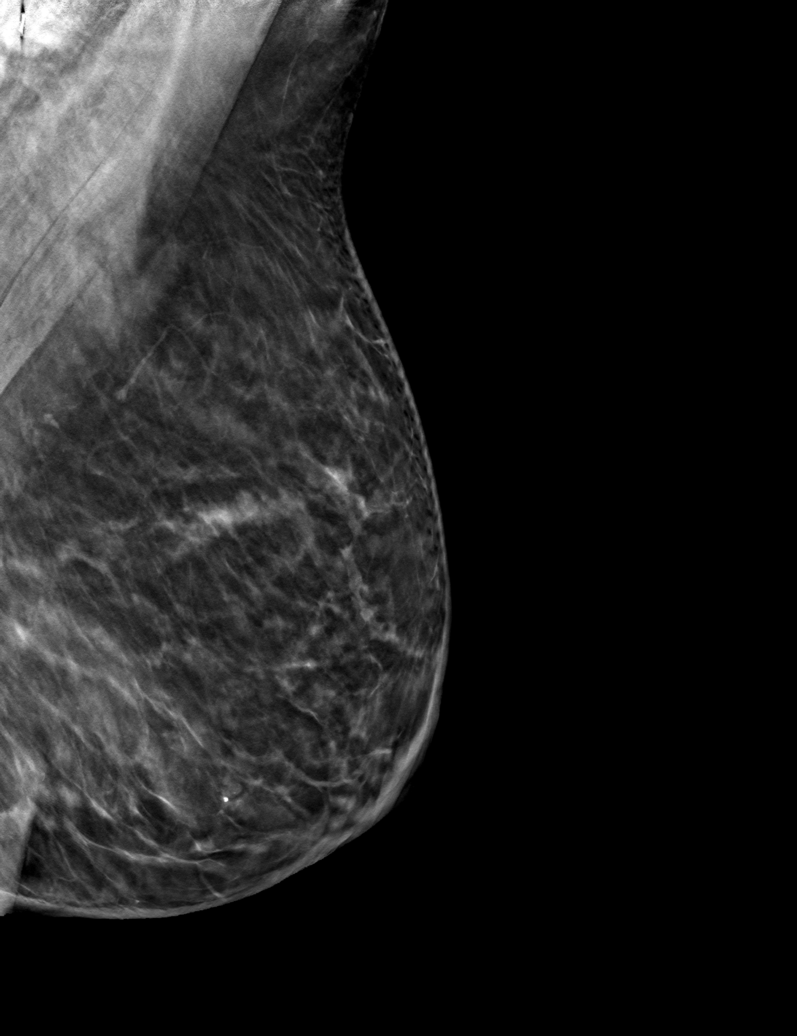

[6 of 18 positions shown; findings below may reference images not displayed]

FINDINGS: Mammographic images were obtained following ultrasound guided biopsy
of the targeted mass over the outer lower quadrant as well as
abnormal left axillary lymph node. The biopsy marking clips are in
the expected positions at the 2 biopsy sites.
IMPRESSION: 1. Appropriate positioning of the ribbon shaped biopsy marking clip
at the site of biopsy in the left outer lower quadrant.

2. Appropriate positioning of the HUI shaped biopsy marking
clip at the site of biopsy in the left axilla.

Final Assessment: Post Procedure Mammograms for Marker Placement

## 2020-06-30 ENCOUNTER — Telehealth: Payer: Self-pay | Admitting: *Deleted

## 2020-06-30 NOTE — Telephone Encounter (Signed)
Confirmed [facility] for 07/08/20 at 1215 .  Instructions and contact information given.

## 2020-07-01 ENCOUNTER — Telehealth: Payer: Self-pay | Admitting: General Practice

## 2020-07-01 ENCOUNTER — Encounter: Payer: Self-pay | Admitting: *Deleted

## 2020-07-01 NOTE — Progress Notes (Signed)
I was updated yesterday that Tami Stone has been dx with early stage breast cancer.  I updated Dr. Julien Nordmann and he would like to talk with her at her appt next week.  Patient is being seen at breast clinic next week.  I have tried reaching out to Tami Stone with no call back.  Due to these unfortunate circumstances, I reached out to CSW to reach out to patient to support her through this time.

## 2020-07-01 NOTE — Telephone Encounter (Signed)
[place] CSW Progress Notes  Call to patient to offer support/resources.  No answer, left VM w my contact information and request to call back.  Edwyna Shell, LCSW Clinical Social Worker Phone:  [phone]

## 2020-07-02 ENCOUNTER — Telehealth: Payer: Self-pay | Admitting: Internal Medicine

## 2020-07-02 ENCOUNTER — Telehealth: Payer: Self-pay | Admitting: Medical Oncology

## 2020-07-02 NOTE — Telephone Encounter (Signed)
Per 1/20 sch msg, Ms. Romaniello has been scheduled to see D.r Julien Nordmann on 2/7 at 10am w/labs at 930am. Letter mailed.

## 2020-07-02 NOTE — Telephone Encounter (Signed)
Pt asking if she should come to her appt Monday with Dr Julien Nordmann. On wed , she has a breast clinic appt for new dx.  She has not started the treatment pills that Dr Julien Nordmann prescribed. Per Julien Nordmann I told her he will see her two weeks after she starts Dabfrenib and Trametinib.. She received them in the mail this week. Schedule message sent to r/s pt for two weeks.

## 2020-07-03 ENCOUNTER — Encounter: Payer: Self-pay | Admitting: *Deleted

## 2020-07-03 ENCOUNTER — Telehealth: Payer: Self-pay

## 2020-07-03 DIAGNOSIS — Z170 Estrogen receptor positive status [ER+]: Secondary | ICD-10-CM | POA: Insufficient documentation

## 2020-07-03 DIAGNOSIS — C50512 Malignant neoplasm of lower-outer quadrant of left female breast: Secondary | ICD-10-CM | POA: Insufficient documentation

## 2020-07-03 NOTE — Telephone Encounter (Signed)
Pt has requested a referral to [facility].  [place] Ph: [phone]  She has also requested her appt on 07/20/20 be cancelled at this time.

## 2020-07-06 ENCOUNTER — Inpatient Hospital Stay: Payer: Medicare Other

## 2020-07-06 ENCOUNTER — Encounter: Payer: Self-pay | Admitting: *Deleted

## 2020-07-06 ENCOUNTER — Inpatient Hospital Stay: Payer: Medicare Other | Admitting: Internal Medicine

## 2020-07-06 ENCOUNTER — Encounter: Payer: Self-pay | Admitting: Internal Medicine

## 2020-07-06 ENCOUNTER — Other Ambulatory Visit: Payer: Self-pay | Admitting: Internal Medicine

## 2020-07-06 DIAGNOSIS — C3492 Malignant neoplasm of unspecified part of left bronchus or lung: Secondary | ICD-10-CM

## 2020-07-06 NOTE — Progress Notes (Signed)
Medical records faxed  to [facility] @ [phone], confirmation received

## 2020-07-06 NOTE — Telephone Encounter (Signed)
Referral made. Thank you.

## 2020-07-06 NOTE — Progress Notes (Signed)
I called [facility] to update them on referral.  They requested I fax referral to them.  I updated Tami Stone to complete fax with patient's demographics, last note, pathology and [facility] report.

## 2020-07-06 NOTE — Progress Notes (Signed)
I received message that patient would like to have referral to [facility].  Dr. Julien Nordmann completed referral.  Patient would like her appt on 2/7 to be cancelled. I cancelled appts. I will update breast navigator.

## 2020-07-06 NOTE — Telephone Encounter (Signed)
I called [facility] regarding referral.  Records have been faxed.

## 2020-07-06 NOTE — Telephone Encounter (Signed)
Thank you :)

## 2020-07-07 ENCOUNTER — Telehealth: Payer: Self-pay | Admitting: *Deleted

## 2020-07-07 NOTE — Telephone Encounter (Signed)
Received notification from new pt coordinator that pt has decided to cx her bmdc for 1/26. She is going to [facility] to discuss her care and will r/s afterwards. [place] team notified

## 2020-07-07 NOTE — Progress Notes (Incomplete)
[facility] CONSULT NOTE  Patient Care Team: Gaynelle Cage, MD as PCP - General (General Practice) Valrie Hart, RN as Oncology Nurse Navigator (Medical Oncology) Mauro Kaufmann, RN as Oncology Nurse Navigator Rockwell Germany, RN as Oncology Nurse Navigator  CHIEF COMPLAINTS/PURPOSE OF CONSULTATION:  Newly diagnosed breast cancer  HISTORY OF PRESENTING ILLNESS:  Tami Stone 70 y.o. female is here because of recent diagnosis of invasive ductal carcinoma of the left breast. She was diagnosed with metastatic non-small cell lung cancer in November 2021 with a left lower lobe lung mass, anterior mediastinal and left axillary lymphadenopathy, left pleural effusion, and bone metastasis. She is currently on oral chemotherapy with dabrafenib and trametinib. She has been followed by Dr. Julien Nordmann. PET scan on 05/28/20 showed a left breast mass and hypermetabolic left axillary lymph nodes. Diagnostic mammogram and [place] on 06/01/20 showed a 1.6cm mass at the 4 o'clock position 6cm from the nipple, a 0.7cm mass at the 4 o'clock position 3cm from the nipple, and at least 6 enlarged left axillary lymph nodes. Biopsy on 06/26/20 showed invasive ductal carcinoma in the breast and axilla, grade 2, HER-2 positive (3+), ER+ >95%, PR+ 90%, Ki67 20%. She presents to the clinic today for initial evaluation and discussion of treatment options.   I reviewed her records extensively and collaborated the history with the patient.  SUMMARY OF ONCOLOGIC HISTORY: Oncology History  Adenocarcinoma of left lung, stage 4 ([place])  05/16/2020 Initial Diagnosis   Adenocarcinoma of left lung, stage 4 ([place])   06/08/2020 -  Chemotherapy   The patient had dabrafenib mesylate (TAFINLAR) 75 MG capsule, 150 mg, Oral, 2 times daily, 0 of 1 cycle, Start date: --, End date: -- trametinib dimethyl sulfoxide (MEKINIST) 2 MG tablet, 2 mg, Oral, Daily, 0 of 1 cycle, Start date: --, End date: --  for chemotherapy treatment.     Malignant neoplasm of lower-outer quadrant of left breast of female, estrogen receptor positive ([place])  06/26/2020 Initial Diagnosis   PET scan for known lung cancer showed a left breast mass and hypermetabolic left axillary lymph nodes. Diagnostic mammogram and US showed a 1.6cm mass at the 4 o'clock position 6cm from the nipple, a 0.7cm mass at the 4 o'clock position 3cm from the nipple, and at least 6 enlarged left axillary lymph nodes. Biopsy showed IDC in the breast and axilla, grade 2, HER-2 positive (3+), ER+ >95%, PR+ 90%, Ki67 20%.     MEDICAL HISTORY:  Past Medical History:  Diagnosis Date  . Diabetes ([place])   . High cholesterol     SURGICAL HISTORY: Past Surgical History:  Procedure Laterality Date  . BREAST BIOPSY Right   . BRONCHIAL BIOPSY  04/30/2020   Procedure: BRONCHIAL BIOPSIES;  Surgeon: Collene Gobble, MD;  Location: [facility] ENDOSCOPY;  Service: Cardiopulmonary;;  . BRONCHIAL BRUSHINGS  04/30/2020   Procedure: BRONCHIAL BRUSHINGS;  Surgeon: Collene Gobble, MD;  Location: [facility] ENDOSCOPY;  Service: Cardiopulmonary;;  . HEMOSTASIS CONTROL  04/30/2020   Procedure: HEMOSTASIS CONTROL;  Surgeon: Collene Gobble, MD;  Location: [place];  Service: Cardiopulmonary;;  . VIDEO BRONCHOSCOPY N/A 04/30/2020   Procedure: VIDEO BRONCHOSCOPY WITH FLUORO;  Surgeon: Collene Gobble, MD;  Location: [place];  Service: Cardiopulmonary;  Laterality: N/A;    SOCIAL HISTORY: Social History   Socioeconomic History  . Marital status: Married    Spouse name: Not on file  . Number of children: Not on file  . Years of education: Not on file  .  Highest education level: Not on file  Occupational History  . Not on file  Tobacco Use  . Smoking status: Never Smoker  . Smokeless tobacco: Never Used  Vaping Use  . Vaping Use: Never used  Substance and Sexual Activity  . Alcohol use: Not Currently  . Drug use: Never  . Sexual activity: Not on file  Other Topics Concern  . Not on  file  Social History Narrative  . Not on file   Social Determinants of Health   Financial Resource Strain: Not on file  Food Insecurity: Not on file  Transportation Needs: Not on file  Physical Activity: Not on file  Stress: Not on file  Social Connections: Not on file  Intimate Partner Violence: Not on file    FAMILY HISTORY: Family History  Problem Relation Age of Onset  . Breast cancer Sister        half sister on maternal side    ALLERGIES:  has No Known Allergies.  MEDICATIONS:  Current Outpatient Medications  Medication Sig Dispense Refill  . Cholecalciferol (VITAMIN D3) 250 MCG (10000 UT) capsule Take 10,000 Units by mouth daily.    . Coenzyme Q10 (CO Q 10 PO) Take 300 mg by mouth daily.    Marland Kitchen dabrafenib mesylate (TAFINLAR) 75 MG capsule Take 2 capsules (150 mg total) by mouth 2 (two) times daily. Take on an empty stomach 1 hour before or 2 hours after meals. 120 capsule 3  . metFORMIN (GLUCOPHAGE) 500 MG tablet Take 1,000 mg by mouth daily with supper.     . Multiple Vitamins-Minerals (MULTIVITAMIN WITH MINERALS) tablet Take 1 tablet by mouth daily.    . NP THYROID 60 MG tablet Take 60 mg by mouth daily.    Marland Kitchen OVER THE COUNTER MEDICATION Take 2 tablets by mouth in the morning, at noon, and at bedtime. lumbrokinase    . PROGESTERONE PO Take 300 mg by mouth daily.    . pseudoephedrine (SUDAFED) 120 MG 12 hr tablet Take 120 mg by mouth daily.    . trametinib dimethyl sulfoxide (MEKINIST) 2 MG tablet Take 1 tablet (2 mg total) by mouth daily. Take 1 hour before or 2 hours after a meal. Store refrigerated in original container. 30 tablet 3  . vitamin C (ASCORBIC ACID) 250 MG tablet Take 500 mg by mouth 3 (three) times daily.    . Zinc 100 MG TABS Take 100 mg by mouth daily.     No current facility-administered medications for this visit.    REVIEW OF SYSTEMS:   Constitutional: Denies fevers, chills or abnormal night sweats Eyes: Denies blurriness of vision, double vision  or watery eyes Ears, nose, mouth, throat, and face: Denies mucositis or sore throat Respiratory: Denies cough, dyspnea or wheezes Cardiovascular: Denies palpitation, chest discomfort or lower extremity swelling Gastrointestinal:  Denies nausea, heartburn or change in bowel habits Skin: Denies abnormal skin rashes Lymphatics: Denies new lymphadenopathy or easy bruising Neurological:Denies numbness, tingling or new weaknesses Behavioral/Psych: Mood is stable, no new changes  Breast: Denies any palpable lumps or discharge All other systems were reviewed with the patient and are negative.  PHYSICAL EXAMINATION: ECOG PERFORMANCE STATUS: {CHL ONC ECOG [phone]}  There were no vitals filed for this visit. There were no vitals filed for this visit.  GENERAL:alert, no distress and comfortable SKIN: skin color, texture, turgor are normal, no rashes or significant lesions EYES: normal, conjunctiva are pink and non-injected, sclera clear OROPHARYNX:no exudate, no erythema and lips, buccal mucosa, and tongue  normal  NECK: supple, thyroid normal size, non-tender, without nodularity LYMPH:  no palpable lymphadenopathy in the cervical, axillary or inguinal LUNGS: clear to auscultation and percussion with normal breathing effort HEART: regular rate & rhythm and no murmurs and no lower extremity edema ABDOMEN:abdomen soft, non-tender and normal bowel sounds Musculoskeletal:no cyanosis of digits and no clubbing  PSYCH: alert & oriented x 3 with fluent speech NEURO: no focal motor/sensory deficits BREAST:*** No palpable nodules in breast. No palpable axillary or supraclavicular lymphadenopathy (exam performed in the presence of a chaperone)   LABORATORY DATA:  I have reviewed the data as listed Lab Results  Component Value Date   WBC 8.4 06/02/2020   HGB 13.7 06/02/2020   HCT 42.0 06/02/2020   MCV 90.5 06/02/2020   PLT 301 06/02/2020   Lab Results  Component Value Date   NA 139  06/02/2020   K 4.4 06/02/2020   CL 105 06/02/2020   CO2 23 06/02/2020    RADIOGRAPHIC STUDIES: I have personally reviewed the radiological reports and agreed with the findings in the report.  ASSESSMENT AND PLAN:  No problem-specific Assessment & Plan notes found for this encounter.   All questions were answered. The patient knows to call the clinic with any problems, questions or concerns.   Rulon Eisenmenger, MD, MPH 07/07/2020    I, Molly Dorshimer, am acting as scribe for Nicholas Lose, MD.  {Add scribe attestation statement}

## 2020-07-07 NOTE — Telephone Encounter (Signed)
Patient decided to cancel appointment in Breast Clinic with Dr. Sondra Come.

## 2020-07-08 ENCOUNTER — Inpatient Hospital Stay: Payer: Medicare Other

## 2020-07-08 ENCOUNTER — Inpatient Hospital Stay: Payer: Medicare Other | Admitting: Hematology and Oncology

## 2020-07-08 ENCOUNTER — Telehealth: Payer: Self-pay | Admitting: Internal Medicine

## 2020-07-08 ENCOUNTER — Ambulatory Visit: Payer: Medicare Other | Admitting: Radiation Oncology

## 2020-07-08 ENCOUNTER — Ambulatory Visit: Payer: Medicare Other | Admitting: Physical Therapy

## 2020-07-08 NOTE — Telephone Encounter (Signed)
[id number] Faxed medical records to [facility] for referral to fax [phone]

## 2020-07-13 ENCOUNTER — Telehealth: Payer: Self-pay | Admitting: Medical Oncology

## 2020-07-13 NOTE — Telephone Encounter (Signed)
Faxed [facility] request for lung pathology slides  to [facility] in pathology.

## 2020-07-20 ENCOUNTER — Ambulatory Visit: Payer: Medicare Other | Admitting: Internal Medicine

## 2020-07-20 ENCOUNTER — Other Ambulatory Visit: Payer: Medicare Other

## 2020-07-23 ENCOUNTER — Telehealth: Payer: Self-pay | Admitting: Medical Oncology

## 2020-07-23 NOTE — Telephone Encounter (Signed)
No more testing needed per [facility]. Called to [facility].

## 2020-07-31 ENCOUNTER — Encounter: Payer: Self-pay | Admitting: *Deleted

## 2020-07-31 NOTE — Progress Notes (Signed)
Patient has decided to received treatment somewhere else.  I noticed she is on the oncology report but she is not receiving treatment here.

## 2020-11-05 ENCOUNTER — Encounter: Payer: Self-pay | Admitting: Internal Medicine

## 2020-11-27 ENCOUNTER — Encounter: Payer: Self-pay | Admitting: *Deleted

## 2020-11-27 NOTE — Progress Notes (Signed)
Patient would like to transfer care to Dr Marin Olp from [place]. She is currently on Taflinar and Mekinist but is unhappy with this regimen. She prefers a more natural method of treatment. She was seen by Dr Earlie Server in the past, but wishes to see Dr Marin Olp now. She understands that we're in the same medical practice.   Patient will be seeing her medical oncologist today and will request a referral be sent.   Oncology Nurse Navigator Documentation  Oncology Nurse Navigator Flowsheets 11/27/2020  Abnormal Finding Date -  Confirmed Diagnosis Date -  Diagnosis Status -  Navigator Follow Up Date: -  Navigator Follow Up Reason: -  Navigation Complete Date: -  Post Navigation: Continue to Follow Patient? 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

## 2020-12-02 ENCOUNTER — Encounter: Payer: Self-pay | Admitting: *Deleted

## 2020-12-02 NOTE — Progress Notes (Signed)
Patient would like to transfer care to Dr Marin Olp. She is currently being seen at [facility] but is more interested in more natural treatment methods.   Referral from [place] was received today. Called patient and she is aware of New Patient appointment, our location and appointment details.   Since patient is already diagnosed and initiated treatment, navigation needs are likely minimal. Will follow until New Patient appointment for possible needs.  Oncology Nurse Navigator Documentation  Oncology Nurse Navigator Flowsheets 12/02/2020  Abnormal Finding Date -  Confirmed Diagnosis Date -  Diagnosis Status -  Navigator Follow Up Date: 12/17/2020  Navigator Follow Up Reason: New Patient Appointment  Navigation Complete Date: -  Post Navigation: Continue to Follow Patient? 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

## 2020-12-17 ENCOUNTER — Inpatient Hospital Stay (HOSPITAL_BASED_OUTPATIENT_CLINIC_OR_DEPARTMENT_OTHER): Payer: Medicare Other | Admitting: Hematology & Oncology

## 2020-12-17 ENCOUNTER — Other Ambulatory Visit: Payer: Self-pay

## 2020-12-17 ENCOUNTER — Inpatient Hospital Stay: Payer: Medicare Other | Attending: Hematology & Oncology

## 2020-12-17 ENCOUNTER — Encounter: Payer: Self-pay | Admitting: *Deleted

## 2020-12-17 VITALS — BP 131/78 | HR 88 | Temp 98.2°F | Resp 16 | Ht 62.0 in | Wt 133.0 lb

## 2020-12-17 DIAGNOSIS — Z170 Estrogen receptor positive status [ER+]: Secondary | ICD-10-CM | POA: Insufficient documentation

## 2020-12-17 DIAGNOSIS — Z7984 Long term (current) use of oral hypoglycemic drugs: Secondary | ICD-10-CM | POA: Insufficient documentation

## 2020-12-17 DIAGNOSIS — E119 Type 2 diabetes mellitus without complications: Secondary | ICD-10-CM

## 2020-12-17 DIAGNOSIS — C50919 Malignant neoplasm of unspecified site of unspecified female breast: Secondary | ICD-10-CM | POA: Insufficient documentation

## 2020-12-17 DIAGNOSIS — E7800 Pure hypercholesterolemia, unspecified: Secondary | ICD-10-CM

## 2020-12-17 DIAGNOSIS — C3492 Malignant neoplasm of unspecified part of left bronchus or lung: Secondary | ICD-10-CM

## 2020-12-17 DIAGNOSIS — Z79811 Long term (current) use of aromatase inhibitors: Secondary | ICD-10-CM | POA: Insufficient documentation

## 2020-12-17 DIAGNOSIS — I313 Pericardial effusion (noninflammatory): Secondary | ICD-10-CM

## 2020-12-17 DIAGNOSIS — C7951 Secondary malignant neoplasm of bone: Secondary | ICD-10-CM | POA: Insufficient documentation

## 2020-12-17 DIAGNOSIS — I3139 Other pericardial effusion (noninflammatory): Secondary | ICD-10-CM

## 2020-12-17 DIAGNOSIS — Z79899 Other long term (current) drug therapy: Secondary | ICD-10-CM | POA: Insufficient documentation

## 2020-12-17 LAB — CMP (CANCER CENTER ONLY)
ALT: 10 U/L (ref 0–44)
AST: 13 U/L — ABNORMAL LOW (ref 15–41)
Albumin: 3.8 g/dL (ref 3.5–5.0)
Alkaline Phosphatase: 92 U/L (ref 38–126)
Anion gap: 5 (ref 5–15)
BUN: 17 mg/dL (ref 8–23)
CO2: 32 mmol/L (ref 22–32)
Calcium: 9.9 mg/dL (ref 8.9–10.3)
Chloride: 98 mmol/L (ref 98–111)
Creatinine: 0.61 mg/dL (ref 0.44–1.00)
GFR, Estimated: >60 mL/min (ref >60)
Glucose, Bld: 102 mg/dL — ABNORMAL HIGH (ref 70–99)
Potassium: 4.6 mmol/L (ref 3.5–5.1)
Sodium: 135 mmol/L (ref 135–145)
Total Bilirubin: 0.2 mg/dL — ABNORMAL LOW (ref 0.3–1.2)
Total Protein: 6.7 g/dL (ref 6.5–8.1)

## 2020-12-17 LAB — CBC WITH DIFFERENTIAL (CANCER CENTER ONLY)
Abs Immature Granulocytes: 0.01 10*3/uL (ref 0.00–0.07)
Basophils Absolute: 0 10*3/uL (ref 0.0–0.1)
Basophils Relative: 0 %
Eosinophils Absolute: 0.6 10*3/uL — ABNORMAL HIGH (ref 0.0–0.5)
Eosinophils Relative: 8 %
HCT: 35.2 % — ABNORMAL LOW (ref 36.0–46.0)
Hemoglobin: 11.2 g/dL — ABNORMAL LOW (ref 12.0–15.0)
Immature Granulocytes: 0 %
Lymphocytes Relative: 26 %
Lymphs Abs: 1.9 10*3/uL (ref 0.7–4.0)
MCH: 28.1 pg (ref 26.0–34.0)
MCHC: 31.8 g/dL (ref 30.0–36.0)
MCV: 88.4 fL (ref 80.0–100.0)
Monocytes Absolute: 0.6 10*3/uL (ref 0.1–1.0)
Monocytes Relative: 9 %
Neutro Abs: 4.1 10*3/uL (ref 1.7–7.7)
Neutrophils Relative %: 57 %
Platelet Count: 328 10*3/uL (ref 150–400)
RBC: 3.98 MIL/uL (ref 3.87–5.11)
RDW: 14.5 % (ref 11.5–15.5)
WBC Count: 7.2 10*3/uL (ref 4.0–10.5)
nRBC: 0 % (ref 0.0–0.2)

## 2020-12-17 LAB — LACTATE DEHYDROGENASE: LDH: 135 U/L (ref 98–192)

## 2020-12-17 NOTE — Progress Notes (Signed)
Pt. decided to stop Mekinist and Tafinlar because she does not want "her immune system to be compromised". She is taking Ivermectin "on and off" - prescribed by her Integrative doctor. She also takes "Protect all" prescribed by the same doctor.

## 2020-12-17 NOTE — Progress Notes (Signed)
Patient comes in today for a second opinion. She was seen earlier this year by Dr Earlie Server but then transferred care to [facility]. She has been on and off treatment with them and doesn't want to continue care with [facility], as they prescribe chemotherapy, and patient is more interested in alternative and/or holistic therapies. Currently patient is not taking her mekinist/tafinlar.   Patient visited with Dr Marin Olp. He recommends restarting her oral chemo along with her complimentary therapy. Patient will have an ECHO done on 01/01/2021 and follow up with this office in about one month. Will continue navigation until that time to see if there are any needs.   Oncology Nurse Navigator Documentation  Oncology Nurse Navigator Flowsheets 12/17/2020  Abnormal Finding Date -  Confirmed Diagnosis Date -  Diagnosis Status -  Navigator Follow Up Date: 01/20/2021  Navigator Follow Up Reason: Follow-up Appointment  Navigation Complete Date: -  Post Navigation: Continue to Follow Patient? 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

## 2020-12-18 ENCOUNTER — Telehealth: Payer: Self-pay | Admitting: *Deleted

## 2020-12-18 NOTE — Telephone Encounter (Signed)
No 12/17/20 los

## 2020-12-19 ENCOUNTER — Encounter: Payer: Self-pay | Admitting: Internal Medicine

## 2020-12-19 NOTE — Progress Notes (Signed)
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  HPI: Tami Stone is actually a very youthful appearing 70 year old white female.  She comes in with her husband.  [place] are originally from [place].  It was nice talking to her about that area of [place].  She has a very interesting history.  She has metastatic adenocarcinoma of the lung.  She has a left lung mass.  She has lymph node involvement and bony involvement.  Surprisingly, her tumor is all BRAF mutated.  As such, she was placed on Mekinist and Tafinlar.  Initially, she tried natural therapy.  Unfortunately this did not work.  She also has a diagnosis of ductal carcinoma of the left breast.  She has been at least stage III by virtue of positive lymph node.  Her tumor is HER-2(+) and ER/PR (+).  She is on Femara for this.  She is trying complementary medicine.  She is taking ivermectin.  She does have a naturopathic doctor that she sees.  Think she is also on other supplements.  She has been followed by Dr. Georgiann Cocker at [facility].  As always, Dr. Georgiann Cocker has done a incredible job with her.  She has been as aggressive as possible with her protocols.  Tami Stone, again, has been focused on trying alternative medications to try to help her cancers.  Her last set of scans were done in early June.  She had increased left pleural effusion.  She had stable or borderline large left lower lobe mass.  There was significant reduction in size of several right lung nodules.  In addition, left axillary and prevascular mediastinal nodes have decreased in size.  There appears to be unchanged bony mets.  There is no mention asked to the breast mass.  She really wants to know if she should continue  the [place] and Tafinlar.  I told her that I would clearly continue these.  I think that she has that very rare adenocarcinoma of the lung that does respond to targeted therapy.  Of note, she has a very high level of PD-L1 so immunotherapy is always a possibility with her.  She is eating okay.  She is having no problems with nausea or vomiting.  There is some shortness of breath.  I do think an echocardiogram would not be a bad idea for her.  Of note, back on 11/21/2020, she had a CT angiogram of her chest.  There is no pulmonary embolism.  She did have the large left pleural effusion.  I would have to say that overall, her performance status is ECOG 1.  Past Medical History:  Diagnosis Date   Diabetes ([place])    High cholesterol   :   Past Surgical History:  Procedure Laterality Date   BREAST BIOPSY Right    BRONCHIAL BIOPSY  04/30/2020   Procedure: BRONCHIAL BIOPSIES;  Surgeon: Collene Gobble, MD;  Location: [facility] ENDOSCOPY;  Service: Cardiopulmonary;;   BRONCHIAL BRUSHINGS  04/30/2020   Procedure: BRONCHIAL BRUSHINGS;  Surgeon: Collene Gobble, MD;  Location: [facility] ENDOSCOPY;  Service: Cardiopulmonary;;   HEMOSTASIS CONTROL  04/30/2020   Procedure: HEMOSTASIS CONTROL;  Surgeon: Collene Gobble, MD;  Location: [place];  Service: Cardiopulmonary;;   VIDEO BRONCHOSCOPY N/A 04/30/2020  Procedure: VIDEO BRONCHOSCOPY WITH FLUORO;  Surgeon: Collene Gobble, MD;  Location: [facility] ENDOSCOPY;  Service: Cardiopulmonary;  Laterality: N/A;  :   Current Outpatient Medications:    IVERMECTIN PO, Take 12 mg by mouth daily., Disp: , Rfl:    Cholecalciferol (VITAMIN D3) 250 MCG (10000 UT) capsule, Take 10,000 Units by mouth daily., Disp: , Rfl:    Coenzyme Q10 (CO Q 10 PO), Take 300 mg by mouth daily., Disp: , Rfl:    dabrafenib mesylate (TAFINLAR) 75 MG capsule, Take 2 capsules (150 mg total) by mouth 2 (two) times daily. Take on an empty stomach 1 hour before or 2 hours after meals. (Patient not  taking: Reported on 12/17/2020), Disp: 120 capsule, Rfl: 3   METFORMIN HCL PO, Take 1,400 mg by mouth daily with supper., Disp: , Rfl:    Multiple Vitamins-Minerals (MULTIVITAMIN WITH MINERALS) tablet, Take 1 tablet by mouth daily., Disp: , Rfl:    NP THYROID 60 MG tablet, Take 60 mg by mouth daily., Disp: , Rfl:    OVER THE COUNTER MEDICATION, Take 2 tablets by mouth in the morning, at noon, and at bedtime. lumbrokinase, Disp: , Rfl:    PROGESTERONE PO, Take 300 mg by mouth daily., Disp: , Rfl:    pseudoephedrine (SUDAFED) 120 MG 12 hr tablet, Take 120 mg by mouth daily., Disp: , Rfl:    trametinib dimethyl sulfoxide (MEKINIST) 2 MG tablet, Take 1 tablet (2 mg total) by mouth daily. Take 1 hour before or 2 hours after a meal. Store refrigerated in original container. (Patient not taking: Reported on 12/17/2020), Disp: 30 tablet, Rfl: 3   vitamin C (ASCORBIC ACID) 250 MG tablet, Take 500 mg by mouth 3 (three) times daily., Disp: , Rfl:    Zinc 100 MG TABS, Take 100 mg by mouth daily., Disp: , Rfl: :  :  No Known Allergies:   Family History  Problem Relation Age of Onset   Breast cancer Sister        half sister on maternal side  :   Social History   Socioeconomic History   Marital status: Married    Spouse name: Not on file   Number of children: Not on file   Years of education: Not on file   Highest education level: Not on file  Occupational History   Not on file  Tobacco Use   Smoking status: Never   Smokeless tobacco: Never  Vaping Use   Vaping Use: Never used  Substance and Sexual Activity   Alcohol use: Not Currently   Drug use: Never   Sexual activity: Not on file  Other Topics Concern   Not on file  Social History Narrative   Not on file   Social Determinants of Health   Financial Resource Strain: Not on file  Food Insecurity: Not on file  Transportation Needs: Not on file  Physical Activity: Not on file  Stress: Not on file  Social Connections: Not on file   Intimate Partner Violence: Not on file  :  Review of Systems  Constitutional:  Positive for malaise/fatigue.  HENT: Negative.    Eyes: Negative.   Respiratory:  Positive for shortness of breath.   Cardiovascular:  Positive for chest pain.  Gastrointestinal: Negative.   Genitourinary: Negative.   Musculoskeletal:  Positive for joint pain and myalgias.  Skin:  Positive for rash.  Neurological: Negative.   Endo/Heme/Allergies: Negative.   Psychiatric/Behavioral: Negative.      Exam:  This is a  petite white female in no obvious distress.  Her vital signs are temperature 98.2.  Pulse 88.  Blood pressure 131/78.  Weight is 133.  Head and neck exam shows no ocular or oral lesions.  There is no scleral icterus.  She has no obvious adenopathy in the neck.  There is no supraclavicular lymph nodes.  I cannot palpate her thyroid.  Lungs show some decrease in the left lung.  She has decent air movement bilaterally.  There may be some crackles and wheezes on the left side.  Cardiac exam regular rate and rhythm with a 1/6 systolic ejection murmur.  Abdomen is soft.  She has decent bowel sounds.  There is no fluid wave.  There is no palpable abdominal mass.  There is no palpable liver or spleen tip.  Back exam shows no tenderness over the spine, ribs or hips.  Extremities shows no clubbing, cyanosis or edema.  Neurological exam shows no focal neurological deficits.  Skin exam does show a little bit of a macular type rash on her arms.   @IPVITALS @    Recent Labs    12/17/20 1114  WBC 7.2  HGB 11.2*  HCT 35.2*  PLT 328    Recent Labs    12/17/20 1114  NA 135  K 4.6  CL 98  CO2 32  GLUCOSE 102*  BUN 17  CREATININE 0.61  CALCIUM 9.9    Blood smear review: None  Pathology: See above    Assessment and Plan: Tami Stone is a very nice 70 year old white female.  Again she is incredibly youthful looking.  She is very tanned.  She comes in with her husband.  She has 2 malignancies.   Clearly, the metastatic adenocarcinoma of the lung is the problem.  She does have the breast cancer which seems to have responded nicely just to Femara.  She certainly could be utilizing anti-HER2 therapy.  However, I am sure that this is something that she would have to really think about.  Again our focus is to get have to be on the adenocarcinoma the lung.  I told her that it really would be important for her to continue on the [place] and Tafinlar.  I really think these are incredibly helpful for her kind of lung cancer.  I told her that maybe 2% of lung cancers will have the BRAF mutation.  Because of this, I would have to think that she hopefully can get a long response with oral therapy.  I Riddle see a problem with her taking complementary therapy.  I told her that we do still have any information as to how well it works.  Again I do not see however will interfere with what she is taking orally.  I do think that a echocardiogram would not be a bad idea for her.  We can see how her heart is doing.  We can see if there is any fluid around her heart.  I know this is incredibly complicated.  I am just very impressed with the motivation that TamiStone and her husband have with respect to her protocol.  I would like to get her back in about a month..  I do think that trying Zometa or Xgeva would not be a bad idea.  Again, I know that she is focused on her complementary therapy.

## 2020-12-21 ENCOUNTER — Telehealth: Payer: Self-pay

## 2020-12-22 ENCOUNTER — Encounter: Payer: Self-pay | Admitting: Internal Medicine

## 2020-12-23 ENCOUNTER — Telehealth: Payer: Self-pay | Admitting: *Deleted

## 2020-12-23 NOTE — Telephone Encounter (Signed)
Call received from patient requesting that lab results from 12/17/20 be faxed to Dr. Gaynelle Cage at [facility] at fax number [phone].  Lab results faxed per pt.'s request.

## 2021-01-01 ENCOUNTER — Ambulatory Visit (HOSPITAL_COMMUNITY): Payer: Medicare Other

## 2021-01-13 ENCOUNTER — Ambulatory Visit: Payer: Medicare Other | Admitting: Hematology & Oncology

## 2021-01-13 ENCOUNTER — Other Ambulatory Visit: Payer: Medicare Other

## 2021-01-18 ENCOUNTER — Encounter (HOSPITAL_BASED_OUTPATIENT_CLINIC_OR_DEPARTMENT_OTHER): Payer: Self-pay | Admitting: Urology

## 2021-01-18 ENCOUNTER — Other Ambulatory Visit: Payer: Self-pay

## 2021-01-18 ENCOUNTER — Inpatient Hospital Stay (HOSPITAL_BASED_OUTPATIENT_CLINIC_OR_DEPARTMENT_OTHER)
Admission: EM | Admit: 2021-01-18 | Discharge: 2021-01-22 | DRG: 180 | Disposition: A | Discharge status: Home / Self Care | Payer: Medicare Other | Source: Ambulatory Visit | Attending: Internal Medicine | Admitting: Internal Medicine

## 2021-01-18 ENCOUNTER — Emergency Department (HOSPITAL_BASED_OUTPATIENT_CLINIC_OR_DEPARTMENT_OTHER): Payer: Medicare Other

## 2021-01-18 ENCOUNTER — Other Ambulatory Visit: Payer: Self-pay | Admitting: Family

## 2021-01-18 ENCOUNTER — Ambulatory Visit (HOSPITAL_BASED_OUTPATIENT_CLINIC_OR_DEPARTMENT_OTHER)
Admission: RE | Admit: 2021-01-18 | Discharge: 2021-01-18 | Disposition: A | Discharge status: Home / Self Care | Payer: Medicare Other | Source: Ambulatory Visit | Attending: Family | Admitting: Family

## 2021-01-18 ENCOUNTER — Ambulatory Visit (HOSPITAL_BASED_OUTPATIENT_CLINIC_OR_DEPARTMENT_OTHER)
Admission: RE | Admit: 2021-01-18 | Discharge: 2021-01-18 | Disposition: A | Discharge status: Home / Self Care | Payer: Medicare Other | Source: Ambulatory Visit | Attending: Hematology & Oncology | Admitting: Hematology & Oncology

## 2021-01-18 ENCOUNTER — Telehealth: Payer: Self-pay | Admitting: *Deleted

## 2021-01-18 ENCOUNTER — Other Ambulatory Visit: Payer: Self-pay | Admitting: *Deleted

## 2021-01-18 DIAGNOSIS — J948 Other specified pleural conditions: Secondary | ICD-10-CM | POA: Diagnosis present

## 2021-01-18 DIAGNOSIS — C3490 Malignant neoplasm of unspecified part of unspecified bronchus or lung: Secondary | ICD-10-CM

## 2021-01-18 DIAGNOSIS — Z20822 Contact with and (suspected) exposure to covid-19: Secondary | ICD-10-CM | POA: Diagnosis present

## 2021-01-18 DIAGNOSIS — J9811 Atelectasis: Secondary | ICD-10-CM | POA: Diagnosis not present

## 2021-01-18 DIAGNOSIS — Z170 Estrogen receptor positive status [ER+]: Secondary | ICD-10-CM | POA: Diagnosis not present

## 2021-01-18 DIAGNOSIS — K5900 Constipation, unspecified: Secondary | ICD-10-CM | POA: Diagnosis not present

## 2021-01-18 DIAGNOSIS — I313 Pericardial effusion (noninflammatory): Secondary | ICD-10-CM | POA: Insufficient documentation

## 2021-01-18 DIAGNOSIS — C3492 Malignant neoplasm of unspecified part of left bronchus or lung: Secondary | ICD-10-CM

## 2021-01-18 DIAGNOSIS — Z79890 Hormone replacement therapy (postmenopausal): Secondary | ICD-10-CM | POA: Diagnosis not present

## 2021-01-18 DIAGNOSIS — C50912 Malignant neoplasm of unspecified site of left female breast: Secondary | ICD-10-CM | POA: Diagnosis not present

## 2021-01-18 DIAGNOSIS — Z79811 Long term (current) use of aromatase inhibitors: Secondary | ICD-10-CM

## 2021-01-18 DIAGNOSIS — C3432 Malignant neoplasm of lower lobe, left bronchus or lung: Secondary | ICD-10-CM | POA: Diagnosis not present

## 2021-01-18 DIAGNOSIS — D630 Anemia in neoplastic disease: Secondary | ICD-10-CM | POA: Diagnosis not present

## 2021-01-18 DIAGNOSIS — R0602 Shortness of breath: Secondary | ICD-10-CM | POA: Diagnosis not present

## 2021-01-18 DIAGNOSIS — E039 Hypothyroidism, unspecified: Secondary | ICD-10-CM | POA: Diagnosis present

## 2021-01-18 DIAGNOSIS — J90 Pleural effusion, not elsewhere classified: Secondary | ICD-10-CM | POA: Diagnosis not present

## 2021-01-18 DIAGNOSIS — I5189 Other ill-defined heart diseases: Secondary | ICD-10-CM | POA: Diagnosis not present

## 2021-01-18 DIAGNOSIS — C7951 Secondary malignant neoplasm of bone: Secondary | ICD-10-CM | POA: Diagnosis present

## 2021-01-18 DIAGNOSIS — Z9889 Other specified postprocedural states: Secondary | ICD-10-CM

## 2021-01-18 DIAGNOSIS — E7800 Pure hypercholesterolemia, unspecified: Secondary | ICD-10-CM | POA: Diagnosis not present

## 2021-01-18 DIAGNOSIS — Z803 Family history of malignant neoplasm of breast: Secondary | ICD-10-CM

## 2021-01-18 DIAGNOSIS — E43 Unspecified severe protein-calorie malnutrition: Secondary | ICD-10-CM | POA: Diagnosis present

## 2021-01-18 DIAGNOSIS — Z79899 Other long term (current) drug therapy: Secondary | ICD-10-CM

## 2021-01-18 DIAGNOSIS — C50512 Malignant neoplasm of lower-outer quadrant of left female breast: Secondary | ICD-10-CM

## 2021-01-18 DIAGNOSIS — Z6824 Body mass index (BMI) 24.0-24.9, adult: Secondary | ICD-10-CM | POA: Diagnosis not present

## 2021-01-18 DIAGNOSIS — Z7984 Long term (current) use of oral hypoglycemic drugs: Secondary | ICD-10-CM | POA: Diagnosis not present

## 2021-01-18 DIAGNOSIS — J910 Malignant pleural effusion: Secondary | ICD-10-CM | POA: Diagnosis present

## 2021-01-18 DIAGNOSIS — I3139 Other pericardial effusion (noninflammatory): Secondary | ICD-10-CM

## 2021-01-18 DIAGNOSIS — E119 Type 2 diabetes mellitus without complications: Secondary | ICD-10-CM | POA: Diagnosis present

## 2021-01-18 HISTORY — DX: Malignant neoplasm of unspecified part of unspecified bronchus or lung: C34.90

## 2021-01-18 HISTORY — DX: Malignant (primary) neoplasm, unspecified: C80.1

## 2021-01-18 LAB — BRAIN NATRIURETIC PEPTIDE: B Natriuretic Peptide: 16.6 pg/mL (ref 0.0–100.0)

## 2021-01-18 LAB — COMPREHENSIVE METABOLIC PANEL
ALT: 14 U/L (ref 0–44)
AST: 17 U/L (ref 15–41)
Albumin: 3.3 g/dL — ABNORMAL LOW (ref 3.5–5.0)
Alkaline Phosphatase: 105 U/L (ref 38–126)
Anion gap: 11 (ref 5–15)
BUN: 13 mg/dL (ref 8–23)
CO2: 28 mmol/L (ref 22–32)
Calcium: 9.1 mg/dL (ref 8.9–10.3)
Chloride: 95 mmol/L — ABNORMAL LOW (ref 98–111)
Creatinine, Ser: 0.6 mg/dL (ref 0.44–1.00)
GFR, Estimated: >60 mL/min (ref >60)
Glucose, Bld: 123 mg/dL — ABNORMAL HIGH (ref 70–99)
Potassium: 3.7 mmol/L (ref 3.5–5.1)
Sodium: 134 mmol/L — ABNORMAL LOW (ref 135–145)
Total Bilirubin: 0.3 mg/dL (ref 0.3–1.2)
Total Protein: 7.3 g/dL (ref 6.5–8.1)

## 2021-01-18 LAB — ECHOCARDIOGRAM COMPLETE
AV Mean grad: 2 mmHg
AV Peak grad: 6.3 mmHg
Ao pk vel: 1.25 m/s
Area-P 1/2: 4.39 cm2
Calc EF: 37.1 %
MV M vel: 6.16 m/s
MV Peak grad: 151.8 mmHg
Single Plane A2C EF: 26.6 %
Single Plane A4C EF: 44.6 %

## 2021-01-18 LAB — TROPONIN I (HIGH SENSITIVITY)
Troponin I (High Sensitivity): 3 ng/L (ref <18)
Troponin I (High Sensitivity): 3 ng/L (ref <18)

## 2021-01-18 LAB — CBC WITH DIFFERENTIAL/PLATELET
Abs Immature Granulocytes: 0.01 10*3/uL (ref 0.00–0.07)
Basophils Absolute: 0 10*3/uL (ref 0.0–0.1)
Basophils Relative: 1 %
Eosinophils Absolute: 0.2 10*3/uL (ref 0.0–0.5)
Eosinophils Relative: 3 %
HCT: 34 % — ABNORMAL LOW (ref 36.0–46.0)
Hemoglobin: 10.9 g/dL — ABNORMAL LOW (ref 12.0–15.0)
Immature Granulocytes: 0 %
Lymphocytes Relative: 24 %
Lymphs Abs: 1.5 10*3/uL (ref 0.7–4.0)
MCH: 27.7 pg (ref 26.0–34.0)
MCHC: 32.1 g/dL (ref 30.0–36.0)
MCV: 86.3 fL (ref 80.0–100.0)
Monocytes Absolute: 0.5 10*3/uL (ref 0.1–1.0)
Monocytes Relative: 9 %
Neutro Abs: 3.9 10*3/uL (ref 1.7–7.7)
Neutrophils Relative %: 63 %
Platelets: 531 10*3/uL — ABNORMAL HIGH (ref 150–400)
RBC: 3.94 MIL/uL (ref 3.87–5.11)
RDW: 15.5 % (ref 11.5–15.5)
WBC: 6.1 10*3/uL (ref 4.0–10.5)
nRBC: 0 % (ref 0.0–0.2)

## 2021-01-18 LAB — RESP PANEL BY RT-PCR (FLU A&B, COVID) ARPGX2
Influenza A by PCR: NEGATIVE
Influenza B by PCR: NEGATIVE
SARS Coronavirus 2 by RT PCR: NEGATIVE

## 2021-01-18 IMAGING — DX DG CHEST 1V PORT
1 series · 1 of 1 positions shown · non-contrast
Comparison: [DATE]

CLINICAL DATA: Shortness of breath.  Stage IV lung cancer

EXAM:
PORTABLE CHEST 1 VIEW

[chest ap]
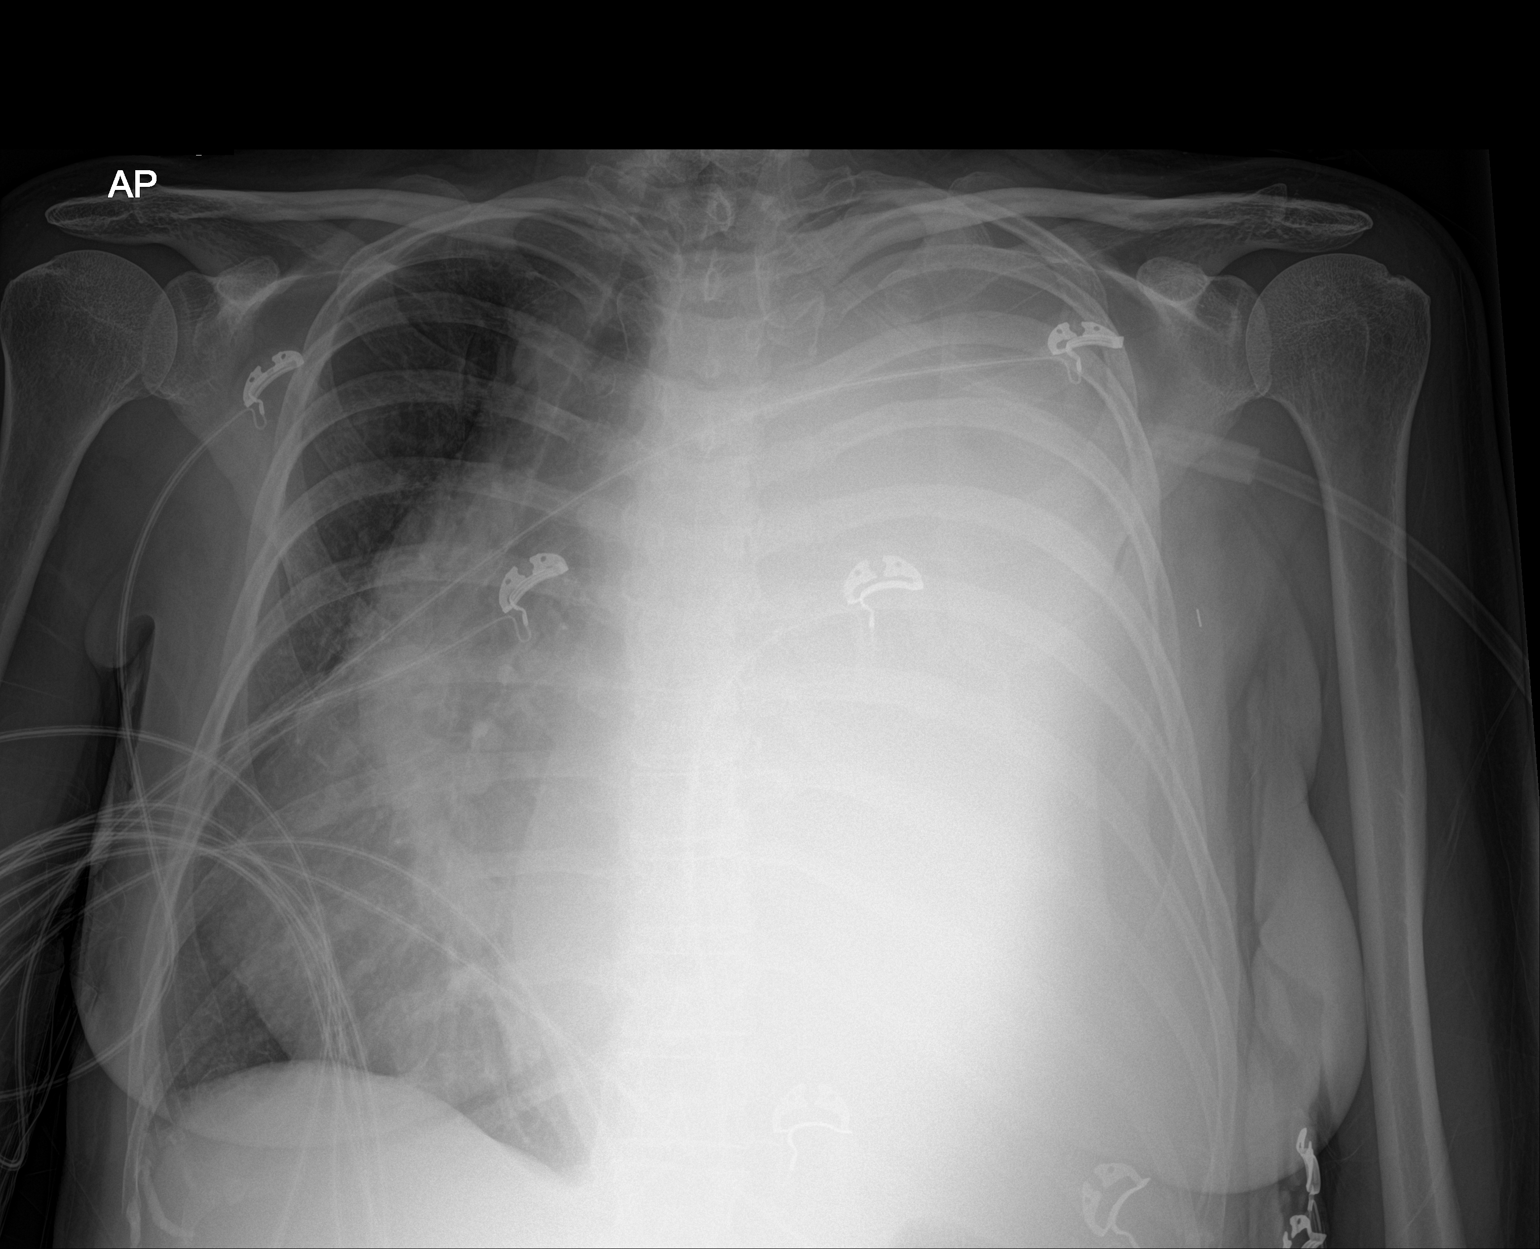

[1 of 1 positions shown; findings below may reference images not displayed]

FINDINGS: Complete opacification of the left hemithorax with rightward
displacement of the heart and mediastinal structures. The visualized
portions of the right lung are grossly clear. No appreciable
right-sided pleural fluid collection. No pneumothorax.
IMPRESSION: Complete opacification of the left hemithorax. Findings likely
represent a combination of a large pleural effusion and atelectasis.

## 2021-01-18 MED ORDER — LETROZOLE 2.5 MG PO TABS
2.5000 mg | ORAL_TABLET | Freq: Every day | ORAL | Status: DC
  Administered 2021-01-19 – 2021-01-22 (×3): 2.5 mg via ORAL
  Filled 2021-01-18 (×5): qty 1

## 2021-01-18 MED ORDER — INSULIN ASPART 100 UNIT/ML IJ SOLN
0.0000 [IU] | Freq: Three times a day (TID) | INTRAMUSCULAR | Status: DC
  Filled 2021-01-18: qty 0.09

## 2021-01-18 MED ORDER — IBUPROFEN 200 MG PO TABS
400.0000 mg | ORAL_TABLET | Freq: Four times a day (QID) | ORAL | Status: DC | PRN
  Administered 2021-01-19 – 2021-01-20 (×2): 400 mg via ORAL
  Filled 2021-01-18 (×2): qty 2

## 2021-01-18 MED ORDER — ACETAMINOPHEN 325 MG PO TABS
650.0000 mg | ORAL_TABLET | Freq: Four times a day (QID) | ORAL | Status: DC | PRN
  Administered 2021-01-19: 650 mg via ORAL
  Filled 2021-01-18 (×2): qty 2

## 2021-01-18 MED ORDER — ACETAMINOPHEN 650 MG RE SUPP: 650.0000 mg | Freq: Four times a day (QID) | RECTAL | Status: DC | PRN

## 2021-01-18 MED ORDER — DABRAFENIB MESYLATE 75 MG PO CAPS
150.0000 mg | ORAL_CAPSULE | Freq: Two times a day (BID) | ORAL | Status: DC
Start: 2021-01-19 — End: 2021-01-21

## 2021-01-18 MED ORDER — THYROID 60 MG PO TABS
60.0000 mg | ORAL_TABLET | Freq: Every day | ORAL | Status: DC
  Administered 2021-01-19 – 2021-01-22 (×3): 60 mg via ORAL
  Filled 2021-01-18 (×4): qty 1

## 2021-01-18 MED ORDER — TRAMETINIB DIMETHYL SULFOXIDE 2 MG PO TABS
2.0000 mg | ORAL_TABLET | Freq: Every day | ORAL | Status: DC
  Administered 2021-01-21 – 2021-01-22 (×2): 2 mg via ORAL

## 2021-01-18 NOTE — ED Notes (Signed)
ED Provider at bedside. 

## 2021-01-18 NOTE — ED Triage Notes (Signed)
States came for Echo r/t Shortness of breath and had Chest xray, Fluid noted on lung and was brought to ER.  States SOB x 1 month and gradually worsening over past week. H/O lung CA and Breat CA, stage 4 dx 1 yr ago

## 2021-01-18 NOTE — ED Provider Notes (Addendum)
[facility] EMERGENCY DEPARTMENT Provider Note   CSN: [id number] Arrival date & time: 01/18/21  1321     History Chief Complaint  Patient presents with  . Shortness of Breath    Tami Stone is a 70 y.o. female.  Patient is a 70 year old female with a history of adenocarcinoma of the left lung currently on oral medications, diabetes and high cholesterol who is presenting today with gradually worsening shortness of breath.  Patient noticed that shortness of breath 2 to 3 weeks ago and has been gradually worsening.  It is worse with exertion and with lying down.  She has had maybe 1 or 2 pound weight gain at the most but is usually losing weight.  She has not noticed any distal edema or abdominal swelling.  She has had a dry nonproductive cough and was recently prescribed nebulizers but does not use them.  She had a echo scheduled today from a month ago and after her echo they instructed her to come directly to the emergency room due to her shortness of breath.  She occasionally has some chest tightness but denies any significant pain.  Sitting upright and being at rest is the only thing that makes it a little better.  The history is provided by the patient and medical records.  Shortness of Breath     Past Medical History:  Diagnosis Date  . Cancer ([place])   . Diabetes ([place])   . High cholesterol     Patient Active Problem List   Diagnosis Date Noted  . Malignant neoplasm of lower-outer quadrant of left breast of female, estrogen receptor positive ([place]) 07/03/2020  . Goals of care, counseling/discussion 06/02/2020  . Adenocarcinoma of left lung, stage 4 ([place]) 05/16/2020  . Encounter for antineoplastic chemotherapy 05/16/2020  . Mass of left breast 05/14/2020  . Mass of lower lobe of left lung 04/28/2020    Past Surgical History:  Procedure Laterality Date  . BREAST BIOPSY Right   . BRONCHIAL BIOPSY  04/30/2020   Procedure: BRONCHIAL BIOPSIES;  Surgeon: Collene Gobble, MD;  Location: [facility] ENDOSCOPY;  Service: Cardiopulmonary;;  . BRONCHIAL BRUSHINGS  04/30/2020   Procedure: BRONCHIAL BRUSHINGS;  Surgeon: Collene Gobble, MD;  Location: [facility] ENDOSCOPY;  Service: Cardiopulmonary;;  . HEMOSTASIS CONTROL  04/30/2020   Procedure: HEMOSTASIS CONTROL;  Surgeon: Collene Gobble, MD;  Location: [place];  Service: Cardiopulmonary;;  . VIDEO BRONCHOSCOPY N/A 04/30/2020   Procedure: VIDEO BRONCHOSCOPY WITH FLUORO;  Surgeon: Collene Gobble, MD;  Location: [place];  Service: Cardiopulmonary;  Laterality: N/A;     OB History   No obstetric history on file.     Family History  Problem Relation Age of Onset  . Breast cancer Sister        half sister on maternal side    Social History   Tobacco Use  . Smoking status: Never  . Smokeless tobacco: Never  Vaping Use  . Vaping Use: Never used  Substance Use Topics  . Alcohol use: Not Currently  . Drug use: Never    Home Medications Prior to Admission medications   Medication Sig Start Date End Date Taking? Authorizing Provider  Cholecalciferol (VITAMIN D3) 250 MCG (10000 UT) capsule Take 10,000 Units by mouth daily.    [provider]  Coenzyme Q10 (CO Q 10 PO) Take 300 mg by mouth daily.    [provider]  dabrafenib mesylate (TAFINLAR) 75 MG capsule Take 2 capsules (150 mg total) by mouth  2 (two) times daily. Take on an empty stomach 1 hour before or 2 hours after meals. Patient not taking: Reported on 12/17/2020 06/02/20   Curt Bears, MD  IVERMECTIN PO Take 12 mg by mouth daily.    [provider]  METFORMIN HCL PO Take 1,400 mg by mouth daily with supper.    [provider]  Multiple Vitamins-Minerals (MULTIVITAMIN WITH MINERALS) tablet Take 1 tablet by mouth daily.    [provider]  NP THYROID 60 MG tablet Take 60 mg by mouth daily. 02/24/20   [provider]  OVER THE COUNTER MEDICATION Take 2 tablets by mouth in the morning, at  noon, and at bedtime. lumbrokinase    [provider]  PROGESTERONE PO Take 300 mg by mouth daily.    [provider]  pseudoephedrine (SUDAFED) 120 MG 12 hr tablet Take 120 mg by mouth daily.    [provider]  trametinib dimethyl sulfoxide (MEKINIST) 2 MG tablet Take 1 tablet (2 mg total) by mouth daily. Take 1 hour before or 2 hours after a meal. Store refrigerated in original container. Patient not taking: Reported on 12/17/2020 06/02/20   Curt Bears, MD  vitamin C (ASCORBIC ACID) 250 MG tablet Take 500 mg by mouth 3 (three) times daily.    [provider]  Zinc 100 MG TABS Take 100 mg by mouth daily.    [provider]    Allergies    Patient has no known allergies.  Review of Systems   Review of Systems  Respiratory:  Positive for shortness of breath.   All other systems reviewed and are negative.  Physical Exam Updated Vital Signs BP (!) 169/117 (BP Location: Right Arm)   Pulse 95   Temp 98 F (36.7 C) (Oral)   Resp 18   Ht 5\' 2"  (1.575 m)   Wt 60.3 kg   SpO2 95%   BMI 24.31 kg/m   Physical Exam Vitals and nursing note reviewed.  Constitutional:      General: She is not in acute distress.    Appearance: She is well-developed. She is ill-appearing.  HENT:     Head: Normocephalic and atraumatic.     Mouth/Throat:     Mouth: Mucous membranes are moist.  Eyes:     Pupils: Pupils are equal, round, and reactive to light.  Cardiovascular:     Rate and Rhythm: Regular rhythm. Tachycardia present.     Heart sounds: Normal heart sounds. No murmur heard.   No friction rub.  Pulmonary:     Effort: Tachypnea and accessory muscle usage present.     Breath sounds: Examination of the left-upper field reveals decreased breath sounds. Examination of the left-middle field reveals decreased breath sounds. Examination of the left-lower field reveals decreased breath sounds. Decreased breath sounds present. No wheezing or rales.   Chest:     Chest wall: No tenderness.  Abdominal:     General: Bowel sounds are normal. There is no distension.     Palpations: Abdomen is soft.     Tenderness: There is no abdominal tenderness. There is no guarding or rebound.  Musculoskeletal:        General: No tenderness. Normal range of motion.     Right lower leg: No edema.     Left lower leg: No edema.     Comments: No edema  Skin:    General: Skin is warm and dry.     Capillary Refill: Capillary refill takes less  than 2 seconds.     Findings: No rash.  Neurological:     Mental Status: She is alert and oriented to person, place, and time. Mental status is at baseline.     Cranial Nerves: No cranial nerve deficit.  Psychiatric:        Mood and Affect: Mood normal.        Behavior: Behavior normal.    ED Results / Procedures / Treatments   Labs (all labs ordered are listed, but only abnormal results are displayed) Labs Reviewed  CBC WITH DIFFERENTIAL/PLATELET - Abnormal; Notable for the following components:      Result Value   Hemoglobin 10.9 (*)    HCT 34.0 (*)    Platelets 531 (*)    All other components within normal limits  COMPREHENSIVE METABOLIC PANEL - Abnormal; Notable for the following components:   Sodium 134 (*)    Chloride 95 (*)    Glucose, Bld 123 (*)    Albumin 3.3 (*)    All other components within normal limits  RESP PANEL BY RT-PCR (FLU A&B, COVID) ARPGX2  BRAIN NATRIURETIC PEPTIDE  TROPONIN I (HIGH SENSITIVITY)    EKG EKG Interpretation  Date/Time:  Monday January 18 2021 13:36:59 EDT Ventricular Rate:  101 PR Interval:  161 QRS Duration: 61 QT Interval:  373 QTC Calculation: 484 R Axis:   34 Text Interpretation: Sinus tachycardia Low voltage, extremity and precordial leads Nonspecific T abnormalities, lateral leads No significant change since last tracing Confirmed by Blanchie Dessert ([id number]) on 01/18/2021 1:53:33 PM  Radiology DG Chest Port 1 View  Result Date: 01/18/2021 CLINICAL  DATA:  Shortness of breath.  Stage IV lung cancer EXAM: PORTABLE CHEST 1 VIEW COMPARISON:  04/30/2020 FINDINGS: Complete opacification of the left hemithorax with rightward displacement of the heart and mediastinal structures. The visualized portions of the right lung are grossly clear. No appreciable right-sided pleural fluid collection. No pneumothorax. IMPRESSION: Complete opacification of the left hemithorax. Findings likely represent a combination of a large pleural effusion and atelectasis. Electronically Signed   By: Davina Poke D.O.   On: 01/18/2021 14:18    Procedures Procedures   Medications Ordered in ED Medications - No data to display  ED Course  I have reviewed the triage vital signs and the nursing notes.  Pertinent labs & imaging results that were available during my care of the patient were reviewed by me and considered in my medical decision making (see chart for details).    MDM Rules/Calculators/A&P                           Elderly female presenting today with worsening shortness of breath that has been gradual in the last 2 to 3 weeks.  However symptoms significant now and worse with any exertion or lying down.  Patient has no external evidence of fluid overload but on chest exam she has significantly decreased left breath sounds.  She was seen to have an echo done today that had been ordered a month ago to rule out pericardial effusion which she did go to today and then they instructed her to come directly to the emergency room.  She denies any infectious symptoms, no abdominal pain.  On exam patient is satting 95% on room air but is tachypneic in the 30s.  Concern for pleural effusion, pneumothorax or less likely pneumonia given patient's breath sounds.  Plain films do show significant pleural effusion with some shift.  Patient was placed on nasal cannula oxygen with some improvement in her work of breathing.  She was also sat upright.  Patient will need admission most  likely for thoracentesis from malignant effusion.  EKG with no significant changes.  Labs are pending.  2:46 PM Stable CBC, CMP and normal BNP.  Chest x-ray with complete opacification of the left lung from pleural effusion and some atelectasis.  Feel this is the cause of the patient's shortness of breath.  My guess is it is a malignant effusion.  Patient is less short of breath on 3 L of nasal cannula.  We will plan on admission for ongoing care and thoracentesis.  3:20 PM Spoke with Dr. Cletus Gash who recommended ED to ED to expedite thoracentesis.  Spoke with Dr. Serafina Royals with IR and order placed.  Pt will go to ER and then to IR for drainage.  MDM   Amount and/or Complexity of Data Reviewed Clinical lab tests: ordered and reviewed Tests in the radiology section of CPT: ordered and reviewed Tests in the medicine section of CPT: ordered and reviewed Independent visualization of images, tracings, or specimens: yes  Patient Progress Patient progress: stable   Final Clinical Impression(s) / ED Diagnoses Final diagnoses:  Pleural effusion on left  SOB (shortness of breath)    Rx / DC Orders ED Discharge Orders     None        Blanchie Dessert, MD 01/18/21 1447    Blanchie Dessert, MD 01/18/21 1524

## 2021-01-18 NOTE — ED Notes (Signed)
Called hospitalist

## 2021-01-18 NOTE — Progress Notes (Signed)
*  PRELIMINARY RESULTS* Echocardiogram 2D Echocardiogram has been performed.  Luisa Hart RDCS 01/18/2021, 1:09 PM

## 2021-01-18 NOTE — Telephone Encounter (Signed)
Message received from patient stating that her shortness of breath is worsening and would like to know if she can be seen today by Dr. Marin Olp.  Call placed back to patient and patient states that SOB has been gradually worsening since her return from [facility] on Saturday.  Pt notified per order of Dr. Marin Olp to come in today for a CXR along with the Echo.  Pt states she will come in early for CXR today and is appreciative of Dr. Antonieta Pert assistance.

## 2021-01-18 NOTE — ED Provider Notes (Signed)
Received call from Dr. Maryan Rued regarding patient.  Patient was presented at [facility] with pleural effusion.  She has been accepted to hospitalist service.  Dr. Maryan Rued Discussed with interventional radiology prior to transfer.  I called interventional radiology to alert them to the patient's presence here in the ED   Pattricia Boss, MD 01/18/21 1651

## 2021-01-18 NOTE — ED Notes (Signed)
Unable to call report to [facility] ED. ED to notify IR upon patient arrival for thoracentesis. [facility] aware of inability to give report to [facility] ED staff.

## 2021-01-18 NOTE — H&P (Signed)
History and Physical    Tami Stone NUU:[id number] DOB: [birth year] DOA: 01/18/2021  PCP: Gaynelle Cage, MD  Patient coming from: Home.  Chief Complaint: Shortness of breath.  HPI: Tami Stone is a 70 y.o. female with history of metastatic adenocarcinoma of the lung and also breast cancer has had 2D echo done following which patient was getting short of breath.  Patient has been having progressively worsening shortness of breath over the last few weeks.  Has known history of left pleural effusion.  As also has been some pleuritic chest pain on the left side for many weeks now.  Denies any fever chills or productive cough.  ED Course: In the ER patient was mildly tachypneic and tachycardic.  Afebrile.  COVID test negative.  Chest x-ray shows complete opacification of the left side likely from pleural effusion and atelectasis.  Patient has been admitted for further management of the left pleural effusion with thoracentesis and observation.  2D echo done today shows moderate sized pericardial effusion with no tamponade.  Does show diastolic dysfunction.  Labs show normal BMP and high sensitive troponin hemoglobin 10.9.   Review of Systems: As per HPI, rest all negative.   Past Medical History:  Diagnosis Date   Cancer ([place])    Diabetes ([place])    High cholesterol     Past Surgical History:  Procedure Laterality Date   BREAST BIOPSY Right    BRONCHIAL BIOPSY  04/30/2020   Procedure: BRONCHIAL BIOPSIES;  Surgeon: Collene Gobble, MD;  Location: [place];  Service: Cardiopulmonary;;   BRONCHIAL BRUSHINGS  04/30/2020   Procedure: BRONCHIAL BRUSHINGS;  Surgeon: Collene Gobble, MD;  Location: [place];  Service: Cardiopulmonary;;   HEMOSTASIS CONTROL  04/30/2020   Procedure: HEMOSTASIS CONTROL;  Surgeon: Collene Gobble, MD;  Location: [place];  Service: Cardiopulmonary;;   VIDEO BRONCHOSCOPY N/A 04/30/2020   Procedure: VIDEO BRONCHOSCOPY WITH FLUORO;  Surgeon: Collene Gobble, MD;  Location: [place];  Service: Cardiopulmonary;  Laterality: N/A;     reports that she has never smoked. She has never used smokeless tobacco. She reports previous alcohol use. She reports that she does not use drugs.  No Known Allergies  Family History  Problem Relation Age of Onset   Breast cancer Sister        half sister on maternal side    Prior to Admission medications   Medication Sig Start Date End Date Taking? Authorizing Provider  Coenzyme Q10 (CO Q 10 PO) Take 300 mg by mouth daily.   Yes [provider]  dabrafenib mesylate (TAFINLAR) 75 MG capsule Take 2 capsules (150 mg total) by mouth 2 (two) times daily. Take on an empty stomach 1 hour before or 2 hours after meals. 06/02/20  Yes Curt Bears, MD  DANDELION PO Take 1 capsule by mouth daily.   Yes [provider]  ibuprofen (ADVIL) 200 MG tablet Take 400 mg by mouth every 6 (six) hours as needed for mild pain.   Yes [provider]  IVERMECTIN PO Take 1.8 mLs by mouth daily. Ivermectin oil - 10 mg/ml   Yes [provider]  letrozole (FEMARA) 2.5 MG tablet Take 2.5 mg by mouth daily. 12/22/20  Yes [provider]  metFORMIN (GLUCOPHAGE-XR) 750 MG 24 hr tablet Take 750 mg by mouth 2 (two) times daily.   Yes [provider]  Misc Natural Products (MAGIC MUSHROOM MIX) CAPS Take 1 tablet by mouth daily.   Yes [provider]  Multiple Vitamins-Minerals (MULTIVITAMIN WITH MINERALS) tablet Take 1 tablet by mouth daily.   Yes [provider]  NP THYROID 60 MG tablet Take 60 mg by mouth daily. 02/24/20  Yes [provider]  OVER THE COUNTER MEDICATION Take 2 capsules by mouth 3 (three) times daily. Protectival   Yes [provider]  trametinib dimethyl sulfoxide (MEKINIST) 2 MG tablet Take 1 tablet (2 mg total) by mouth daily. Take 1 hour before or 2 hours after a meal. Store refrigerated in original container. 06/02/20  Yes  Curt Bears, MD  Zinc 100 MG TABS Take 100 mg by mouth daily.   Yes [provider]    Physical Exam: Constitutional: Moderately built and nourished. Vitals:   01/18/21 2008 01/18/21 2100 01/18/21 2200 01/18/21 2304  BP: (!) 142/95 (!) 139/104 (!) 134/99 (!) 125/93  Pulse: 95 100 94 (!) 102  Resp: (!) 37 (!) 32 (!) 21 (!) 38  Temp:      TempSrc:      SpO2: 97% 99% 100% 100%  Weight:      Height:       Eyes: Anicteric no pallor. ENMT: No discharge from the ears eyes nose and mouth. Neck: No mass felt.  No neck rigidity with no JVD appreciated. Respiratory: No rhonchi or crepitations. Cardiovascular: S1-S2 heard. Abdomen: Soft nontender bowel sound present. Musculoskeletal: Mild edema of the lower extremities.   Skin: No rash. Neurologic: Alert awake oriented to time place and person.  Moves all extremities. Psychiatric: Appears normal.  Normal affect.   Labs on Admission: I have personally reviewed following labs and imaging studies  CBC: Recent Labs  Lab 01/18/21 1400  WBC 6.1  NEUTROABS 3.9  HGB 10.9*  HCT 34.0*  MCV 86.3  PLT 193*   Basic Metabolic Panel: Recent Labs  Lab 01/18/21 1400  NA 134*  K 3.7  CL 95*  CO2 28  GLUCOSE 123*  BUN 13  CREATININE 0.60  CALCIUM 9.1   GFR: Estimated Creatinine Clearance: 55.2 mL/min (by C-G formula based on SCr of 0.6 mg/dL). Liver Function Tests: Recent Labs  Lab 01/18/21 1400  AST 17  ALT 14  ALKPHOS 105  BILITOT 0.3  PROT 7.3  ALBUMIN 3.3*   No results for input(s): LIPASE, AMYLASE in the last 168 hours. No results for input(s): AMMONIA in the last 168 hours. Coagulation Profile: No results for input(s): INR, PROTIME in the last 168 hours. Cardiac Enzymes: No results for input(s): CKTOTAL, CKMB, CKMBINDEX, TROPONINI in the last 168 hours. BNP (last 3 results) No results for input(s): PROBNP in the last 8760 hours. HbA1C: No results for input(s): HGBA1C in the last 72 hours. CBG: No  results for input(s): GLUCAP in the last 168 hours. Lipid Profile: No results for input(s): CHOL, HDL, LDLCALC, TRIG, CHOLHDL, LDLDIRECT in the last 72 hours. Thyroid Function Tests: No results for input(s): TSH, T4TOTAL, FREET4, T3FREE, THYROIDAB in the last 72 hours. Anemia Panel: No results for input(s): VITAMINB12, FOLATE, FERRITIN, TIBC, IRON, RETICCTPCT in the last 72 hours. Urine analysis: No results found for: COLORURINE, APPEARANCEUR, LABSPEC, PHURINE, GLUCOSEU, HGBUR, BILIRUBINUR, KETONESUR, PROTEINUR, UROBILINOGEN, NITRITE, LEUKOCYTESUR Sepsis Labs: @LABRCNTIP (procalcitonin:4,lacticidven:4) ) Recent Results (from the past 240 hour(s))  Resp Panel by RT-PCR (Flu A&B, Covid) Nasopharyngeal Swab     Status: None   Collection Time: 01/18/21  2:10 PM   Specimen: Nasopharyngeal Swab; Nasopharyngeal(NP) swabs in vial transport medium  Result Value Ref Range Status   SARS Coronavirus 2 by RT PCR  NEGATIVE NEGATIVE Final    Comment: (NOTE) SARS-CoV-2 target nucleic acids are NOT DETECTED.  The SARS-CoV-2 RNA is generally detectable in upper respiratory specimens during the acute phase of infection. The lowest concentration of SARS-CoV-2 viral copies this assay can detect is 138 copies/mL. A negative result does not preclude SARS-Cov-2 infection and should not be used as the sole basis for treatment or other patient management decisions. A negative result may occur with  improper specimen collection/handling, submission of specimen other than nasopharyngeal swab, presence of viral mutation(s) within the areas targeted by this assay, and inadequate number of viral copies(<138 copies/mL). A negative result must be combined with clinical observations, patient history, and epidemiological information. The expected result is Negative.  Fact Sheet for Patients:  EntrepreneurPulse.com.au  Fact Sheet for Healthcare Providers:   IncredibleEmployment.be  This test is no t yet approved or cleared by the [place] FDA and  has been authorized for detection and/or diagnosis of SARS-CoV-2 by FDA under an Emergency Use Authorization (EUA). This EUA will remain  in effect (meaning this test can be used) for the duration of the COVID-19 declaration under Section 564(b)(1) of the Act, 21 U.S.C.section 360bbb-3(b)(1), unless the authorization is terminated  or revoked sooner.       Influenza A by PCR NEGATIVE NEGATIVE Final   Influenza B by PCR NEGATIVE NEGATIVE Final    Comment: (NOTE) The Xpert Xpress SARS-CoV-2/FLU/RSV plus assay is intended as an aid in the diagnosis of influenza from Nasopharyngeal swab specimens and should not be used as a sole basis for treatment. Nasal washings and aspirates are unacceptable for Xpert Xpress SARS-CoV-2/FLU/RSV testing.  Fact Sheet for Patients: EntrepreneurPulse.com.au  Fact Sheet for Healthcare Providers: IncredibleEmployment.be  This test is not yet approved or cleared by the [place] FDA and has been authorized for detection and/or diagnosis of SARS-CoV-2 by FDA under an Emergency Use Authorization (EUA). This EUA will remain in effect (meaning this test can be used) for the duration of the COVID-19 declaration under Section 564(b)(1) of the Act, 21 U.S.C. section 360bbb-3(b)(1), unless the authorization is terminated or revoked.  Performed at [facility], [address] [place], [place] [address]      Radiological Exams on Admission: DG Chest [facility] 1 View  Result Date: 01/18/2021 CLINICAL DATA:  Shortness of breath.  Stage IV lung cancer EXAM: PORTABLE CHEST 1 VIEW COMPARISON:  04/30/2020 FINDINGS: Complete opacification of the left hemithorax with rightward displacement of the heart and mediastinal structures. The visualized portions of the right lung are grossly clear. No appreciable  right-sided pleural fluid collection. No pneumothorax. IMPRESSION: Complete opacification of the left hemithorax. Findings likely represent a combination of a large pleural effusion and atelectasis. Electronically Signed   By: Davina Poke D.O.   On: 01/18/2021 14:18   ECHOCARDIOGRAM COMPLETE  Result Date: 01/18/2021    ECHOCARDIOGRAM REPORT   Patient Name:   Tami Stone Date of Exam: 01/18/2021 Medical Rec #:  [id number]          Height:       62.0 in Accession #:    [id number]         Weight:       133.0 lb Date of Birth:  07/25/1950          BSA:          1.607 m Patient Age:    21 years           BP:  165/110 mmHg Patient Gender: F                  HR:           101 bpm. Exam Location:  [place] Procedure: 2D Echo, Cardiac Doppler, Color Doppler and Strain Analysis                    STAT ECHO Reported to: Dr Stanford Breed on 01/18/2021 1:35:00 PM                   tamponade. Indications:    Pericardial effusion  History:        Patient has no prior history of Echocardiogram examinations.  Sonographer:    Luisa Hart RDCS Referring Phys: [place]  1. Left ventricular ejection fraction, by estimation, is 55 to 60%. The left ventricle has normal function. The left ventricle has no regional wall motion abnormalities. Left ventricular diastolic parameters are consistent with Grade I diastolic dysfunction (impaired relaxation).  2. Right ventricular systolic function is normal. The right ventricular size is normal.  3. Grossly no evidence of tamponade. Moderate pericardial effusion. The pericardial effusion is circumferential. Large pleural effusion in the left lateral region.  4. The mitral valve is normal in structure. Mild mitral valve regurgitation. No evidence of mitral stenosis.  5. The aortic valve is normal in structure. Aortic valve regurgitation is not visualized. No aortic stenosis is present.  6. The inferior vena cava is normal in size with greater than 50%  respiratory variability, suggesting right atrial pressure of 3 mmHg. FINDINGS  Left Ventricle: Left ventricular ejection fraction, by estimation, is 55 to 60%. The left ventricle has normal function. The left ventricle has no regional wall motion abnormalities. The left ventricular internal cavity size was normal in size. There is  no left ventricular hypertrophy. Left ventricular diastolic parameters are consistent with Grade I diastolic dysfunction (impaired relaxation). Right Ventricle: The right ventricular size is normal. No increase in right ventricular wall thickness. Right ventricular systolic function is normal. Left Atrium: Left atrial size was normal in size. Right Atrium: Right atrial size was normal in size. Pericardium: Grossly no evidence of tamponade. A moderately sized pericardial effusion is present. The pericardial effusion is circumferential. Mitral Valve: The mitral valve is normal in structure. Mild mitral valve regurgitation. No evidence of mitral valve stenosis. Tricuspid Valve: The tricuspid valve is normal in structure. Tricuspid valve regurgitation is not demonstrated. No evidence of tricuspid stenosis. Aortic Valve: The aortic valve is normal in structure. Aortic valve regurgitation is not visualized. No aortic stenosis is present. Aortic valve mean gradient measures 2.0 mmHg. Aortic valve peak gradient measures 6.2 mmHg. Pulmonic Valve: The pulmonic valve was normal in structure. Pulmonic valve regurgitation is not visualized. No evidence of pulmonic stenosis. Aorta: The aortic root is normal in size and structure. Venous: The inferior vena cava is normal in size with greater than 50% respiratory variability, suggesting right atrial pressure of 3 mmHg. IAS/Shunts: No atrial level shunt detected by color flow Doppler. Additional Comments: There is a large pleural effusion in the left lateral region.   LV Volumes (MOD) LV vol d, MOD A2C: 51.1 ml Diastology LV vol d, MOD A4C: 48.4 ml LV e'  medial:    4.68 cm/s LV vol s, MOD A2C: 37.5 ml LV E/e' medial:  14.0 LV vol s, MOD A4C: 26.8 ml LV e' lateral:   5.33 cm/s LV SV MOD A2C:  13.6 ml LV E/e' lateral: 12.3 LV SV MOD A4C:     48.4 ml LV SV MOD BP:      18.8 ml LEFT ATRIUM             Index LA Vol (A2C):   33.3 ml 20.72 ml/m LA Vol (A4C):   28.1 ml 17.48 ml/m LA Biplane Vol: 31.5 ml 19.60 ml/m  AORTIC VALVE AV Vmax:           125.00 cm/s AV Vmean:          71.300 cm/s AV VTI:            0.176 m AV Peak Grad:      6.2 mmHg AV Mean Grad:      2.0 mmHg LVOT Vmax:         80.00 cm/s LVOT Vmean:        53.100 cm/s LVOT VTI:          0.116 m LVOT/AV VTI ratio: 0.66 MITRAL VALVE MV Area (PHT): 4.39 cm    SHUNTS MV Decel Time: 173 msec    Systemic VTI: 0.12 m MR Peak grad: 151.8 mmHg MR Vmax:      616.00 cm/s MV E velocity: 65.60 cm/s MV A velocity: 93.50 cm/s MV E/A ratio:  0.70 Jenne Campus MD Electronically signed by Jenne Campus MD Signature Date/Time: 01/18/2021/3:59:55 PM    Final     EKG: Independently reviewed.  Sinus tachycardia.  Assessment/Plan Principal Problem:   Pleural effusion on left Active Problems:   Adenocarcinoma of left lung, stage 4 (HCC)    Left lower effusion with known history of metastatic adenocarcinoma of the lung pleural effusion likely malignant.  We will get ultrasound-guided thoracentesis.  Closely observe. Moderate pericardial effusion seen in the 2D echo done today.  No signs of tamponade.  Today also did show diastolic dysfunction.  Closely observe. History of diabetes mellitus on 20 scale coverage for now. Hypothyroidism on thyroid replacement. 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  Follow CBC. Metastatic adenocarcinoma of the lung and history of breast cancer we will continue home therapy and will notify Dr. Burney Gauze oncologist about admission.   DVT prophylaxis: SCDs.  Avoiding anticoagulation in anticipation of thoracentesis. Code Status: Full code. Family Communication: Patient's  husband at the bedside. Disposition Plan: Home when stable. Consults called: IR for thoracentesis.  Will notify patient's oncologist. Admission status: Observation.   Rise Patience MD [facility] Pager [phone].  If 7PM-7AM, please contact night-coverage [email or web address] Password [facility]  01/18/2021, 11:44 PM

## 2021-01-18 NOTE — ED Notes (Signed)
Dr Rutherford Guys was sent a secure message in regards to the patient being here for scheduled Thoracentesis.  I spoke to [place] in [employer] and he said that they are aware of the procedure planned

## 2021-01-19 ENCOUNTER — Encounter (HOSPITAL_COMMUNITY): Payer: Self-pay | Admitting: Internal Medicine

## 2021-01-19 ENCOUNTER — Observation Stay (HOSPITAL_COMMUNITY): Payer: Medicare Other

## 2021-01-19 DIAGNOSIS — C3492 Malignant neoplasm of unspecified part of left bronchus or lung: Secondary | ICD-10-CM

## 2021-01-19 DIAGNOSIS — Z79811 Long term (current) use of aromatase inhibitors: Secondary | ICD-10-CM

## 2021-01-19 DIAGNOSIS — J90 Pleural effusion, not elsewhere classified: Secondary | ICD-10-CM

## 2021-01-19 DIAGNOSIS — C7951 Secondary malignant neoplasm of bone: Secondary | ICD-10-CM | POA: Diagnosis not present

## 2021-01-19 DIAGNOSIS — C779 Secondary and unspecified malignant neoplasm of lymph node, unspecified: Secondary | ICD-10-CM

## 2021-01-19 DIAGNOSIS — Z79899 Other long term (current) drug therapy: Secondary | ICD-10-CM

## 2021-01-19 DIAGNOSIS — Z794 Long term (current) use of insulin: Secondary | ICD-10-CM

## 2021-01-19 DIAGNOSIS — E119 Type 2 diabetes mellitus without complications: Secondary | ICD-10-CM

## 2021-01-19 DIAGNOSIS — C50912 Malignant neoplasm of unspecified site of left female breast: Secondary | ICD-10-CM

## 2021-01-19 DIAGNOSIS — Z170 Estrogen receptor positive status [ER+]: Secondary | ICD-10-CM

## 2021-01-19 LAB — CBC
HCT: 30.1 % — ABNORMAL LOW (ref 36.0–46.0)
Hemoglobin: 9.4 g/dL — ABNORMAL LOW (ref 12.0–15.0)
MCH: 27.7 pg (ref 26.0–34.0)
MCHC: 31.2 g/dL (ref 30.0–36.0)
MCV: 88.8 fL (ref 80.0–100.0)
Platelets: 420 10*3/uL — ABNORMAL HIGH (ref 150–400)
RBC: 3.39 MIL/uL — ABNORMAL LOW (ref 3.87–5.11)
RDW: 15.6 % — ABNORMAL HIGH (ref 11.5–15.5)
WBC: 6 10*3/uL (ref 4.0–10.5)
nRBC: 0 % (ref 0.0–0.2)

## 2021-01-19 LAB — BASIC METABOLIC PANEL
Anion gap: 10 (ref 5–15)
BUN: 13 mg/dL (ref 8–23)
CO2: 27 mmol/L (ref 22–32)
Calcium: 9.1 mg/dL (ref 8.9–10.3)
Chloride: 96 mmol/L — ABNORMAL LOW (ref 98–111)
Creatinine, Ser: 0.57 mg/dL (ref 0.44–1.00)
GFR, Estimated: >60 mL/min (ref >60)
Glucose, Bld: 107 mg/dL — ABNORMAL HIGH (ref 70–99)
Potassium: 3.9 mmol/L (ref 3.5–5.1)
Sodium: 133 mmol/L — ABNORMAL LOW (ref 135–145)

## 2021-01-19 LAB — GLUCOSE, CAPILLARY
Glucose-Capillary: 95 mg/dL (ref 70–99)
Glucose-Capillary: 108 mg/dL — ABNORMAL HIGH (ref 70–99)
Glucose-Capillary: 119 mg/dL — ABNORMAL HIGH (ref 70–99)
Glucose-Capillary: 117 mg/dL — ABNORMAL HIGH (ref 70–99)

## 2021-01-19 LAB — TSH: TSH: 12.988 u[IU]/mL — ABNORMAL HIGH (ref 0.350–4.500)

## 2021-01-19 IMAGING — DX DG CHEST 1V
1 series · 1 of 1 positions shown · non-contrast
Comparison: Chest radiograph [DATE]

CLINICAL DATA: Status post thoracentesis.  2.4 L removed.

EXAM:
CHEST  1 VIEW

[chest ap]
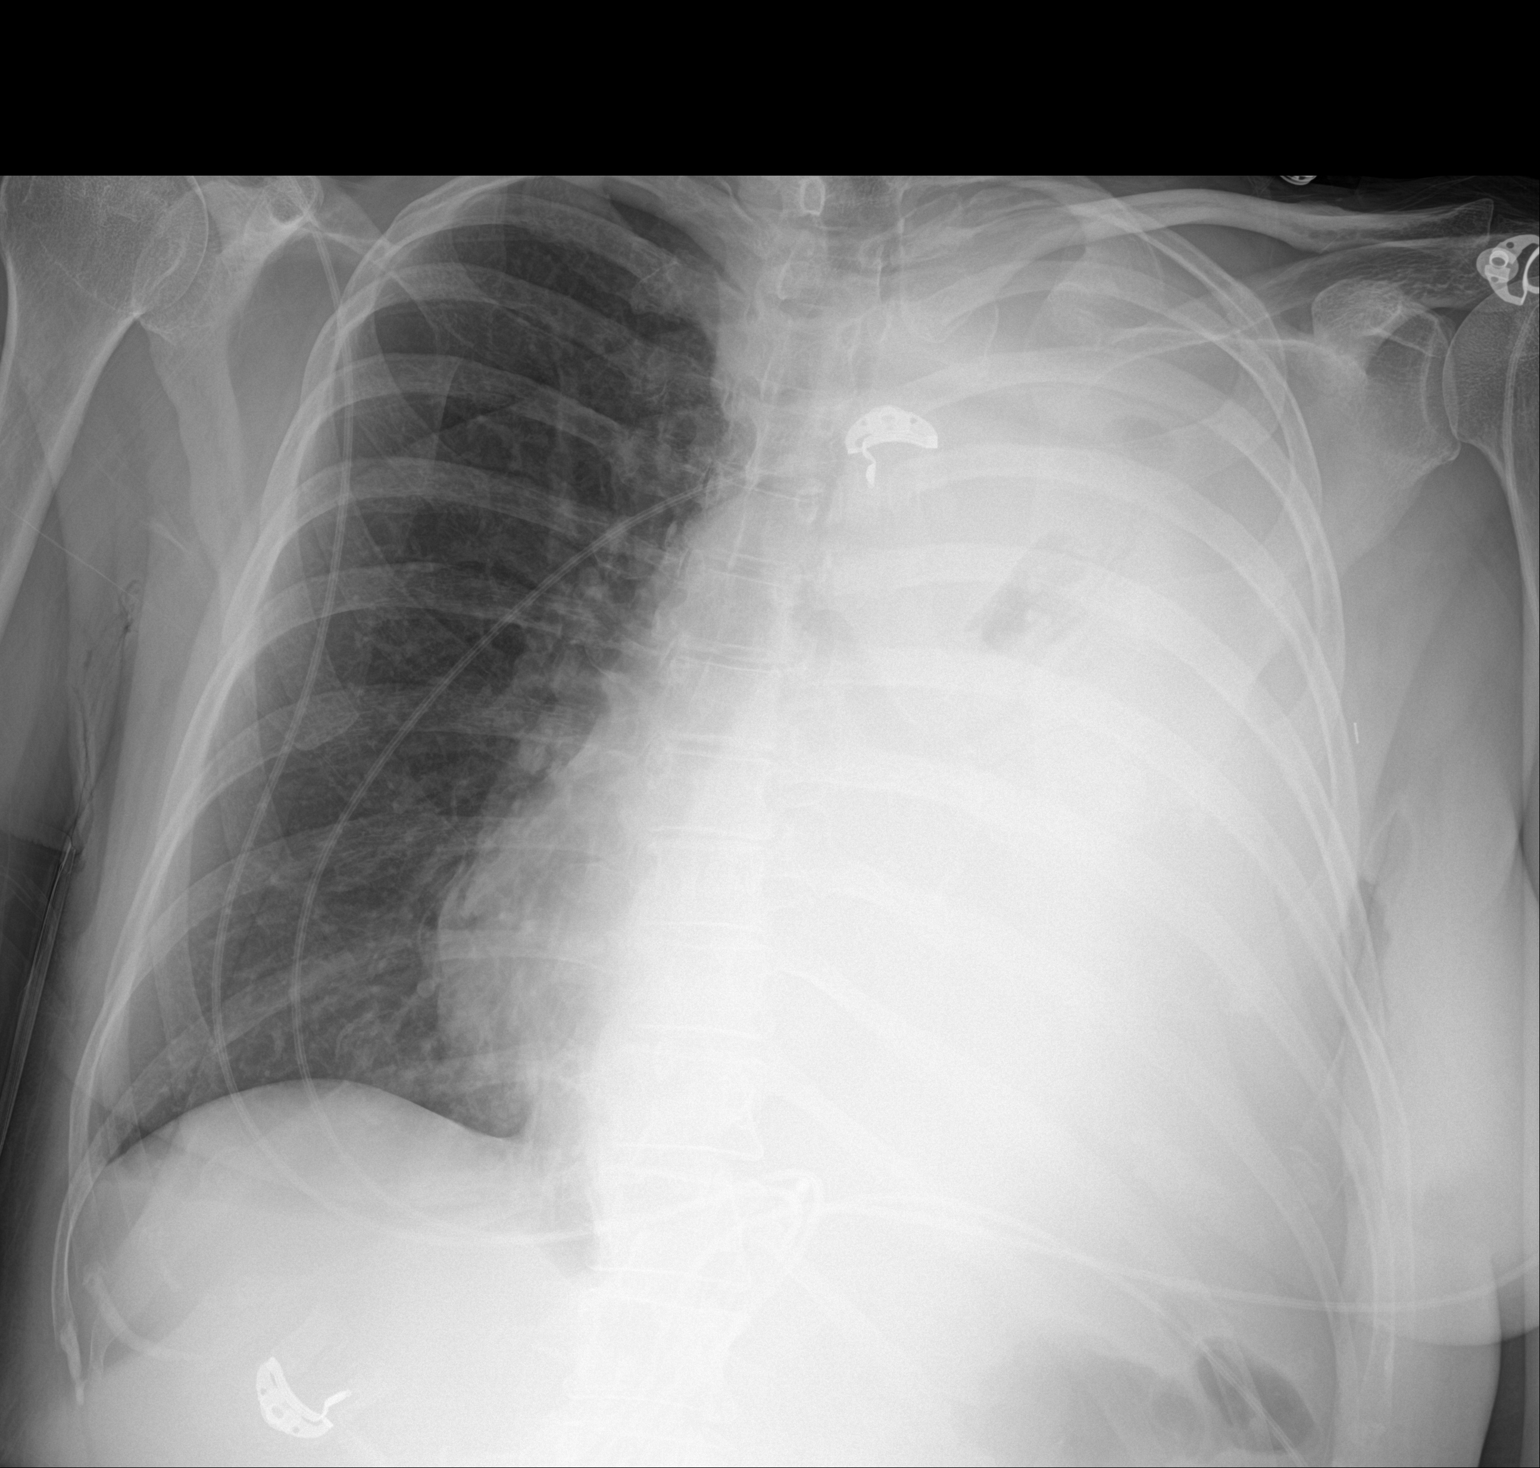

[1 of 1 positions shown; findings below may reference images not displayed]

FINDINGS: Post LEFT thoracentesis with return of the mediastinal structures to
midline following fluid removal. A small amount of aerated lung in
the LEFT upper lobe is not demonstrated although there is continued
near complete opacification of the LEFT hemithorax. No pneumothorax
evident. RIGHT lung is clear without edema or pneumothorax.
IMPRESSION: 1. Return of mediastinal structures to midline following
thoracentesis.
2. A small amount of aerated lung is now evident in LEFT upper lobe
following thoracentesis. LEFT hemithorax remains near completely
opacified.
3. No pneumothorax.

## 2021-01-19 IMAGING — CT CT CHEST W/ CM
2 of 4 series · 15 of 36 positions shown, 18 images · IV contrast (omnipaque)
Comparison: Chest CT [DATE].  Chest x-ray today.

CLINICAL DATA: Abnormal chest x-ray.

EXAM:
CT CHEST WITH CONTRAST
TECHNIQUE: Multidetector CT imaging of the chest was performed during
intravenous contrast administration.
CONTRAST:  60mL OMNIPAQUE IOHEXOL 350 MG/ML SOLN

[Series 2: axial st · axial · 0.71mm/px · z∈[-383,-99]mm · 12 of 168 slices shown, 15 images]
[im 13/168  mediastinal]
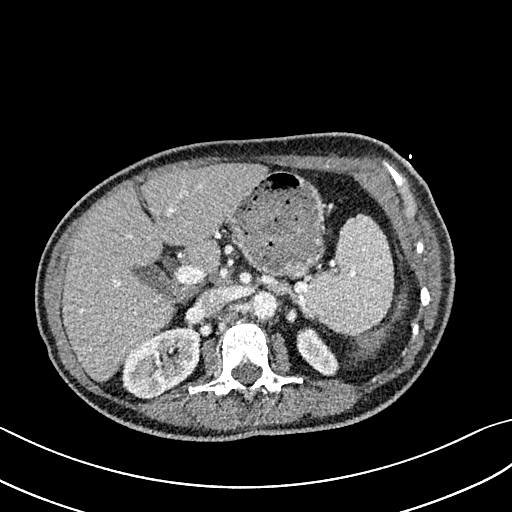
[im 13/168  lung]
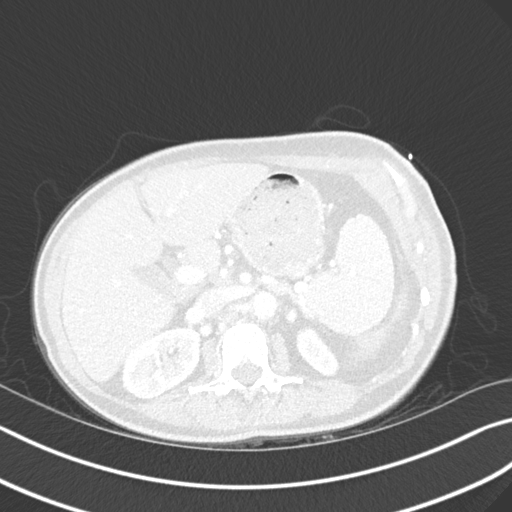
[im 26/168  lung]
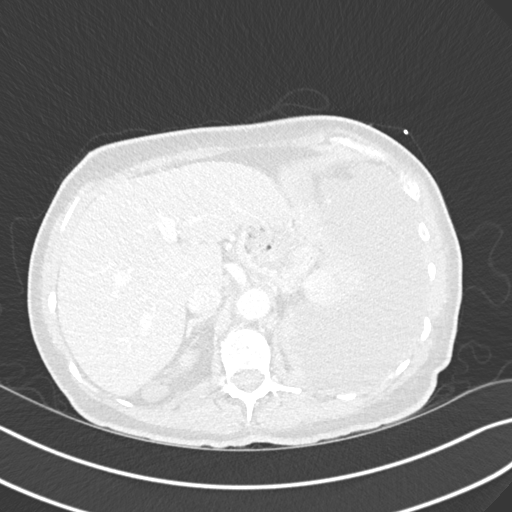
[im 39/168  lung]
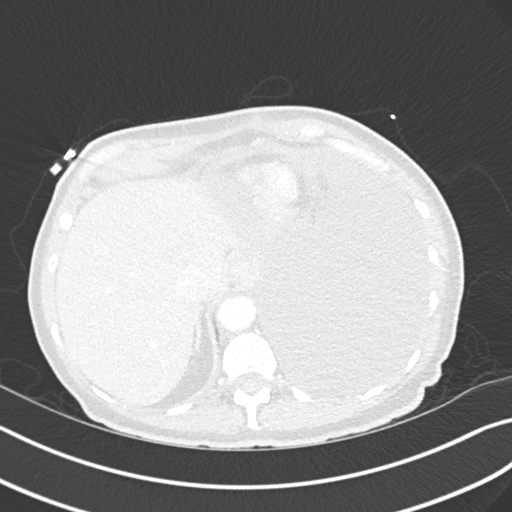
[im 52/168  lung]
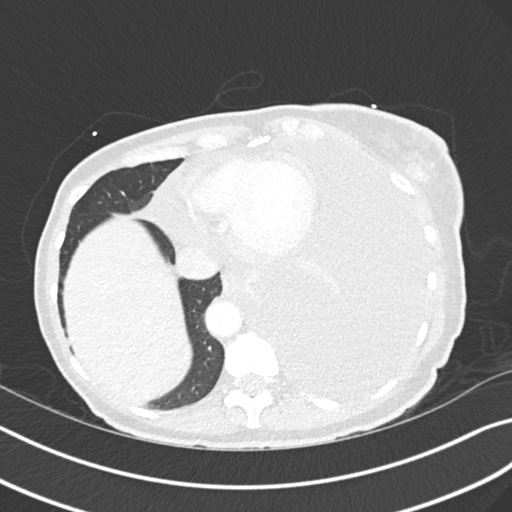
[im 65/168  mediastinal]
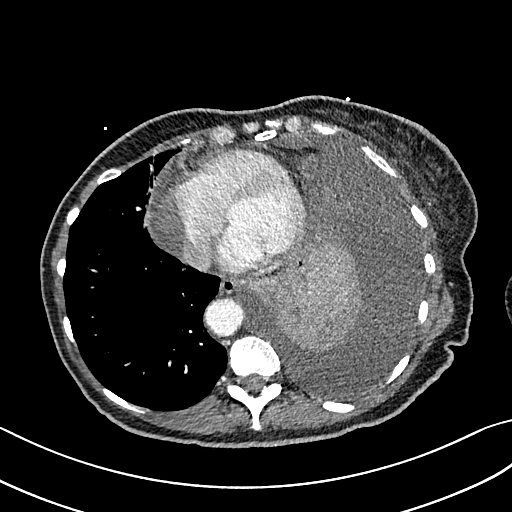
[im 65/168  lung]
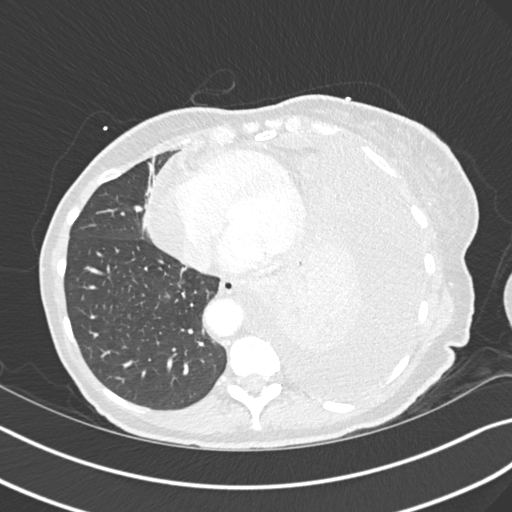
[im 78/168  lung]
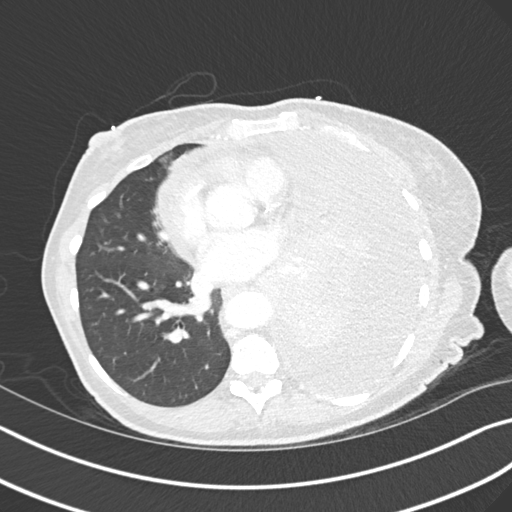
[im 90/168  lung]
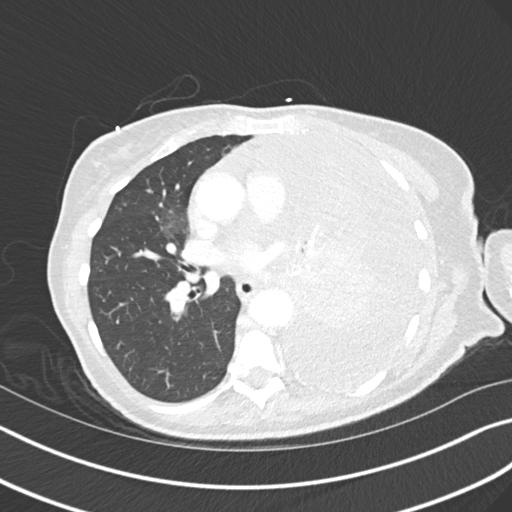
[im 103/168  lung]
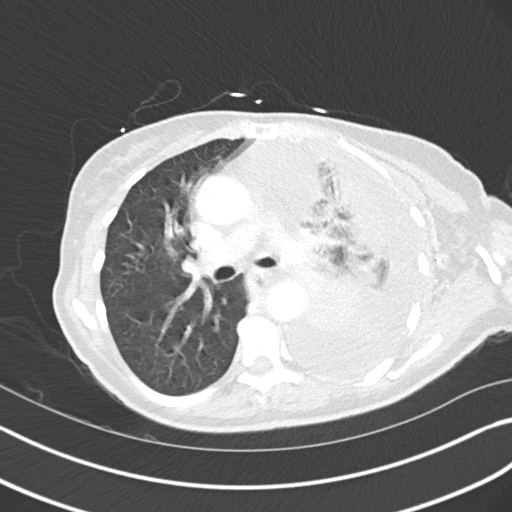
[im 116/168  mediastinal]
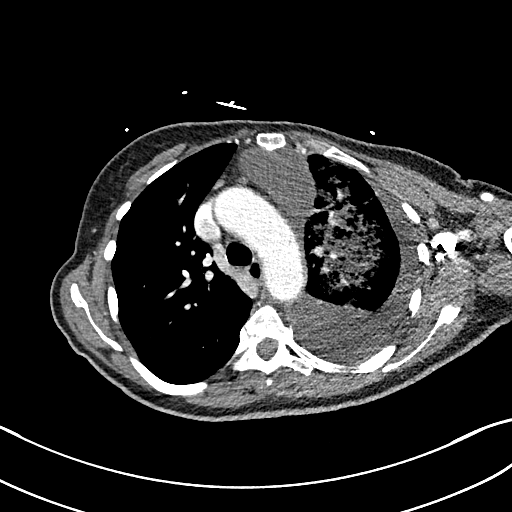
[im 116/168  lung]
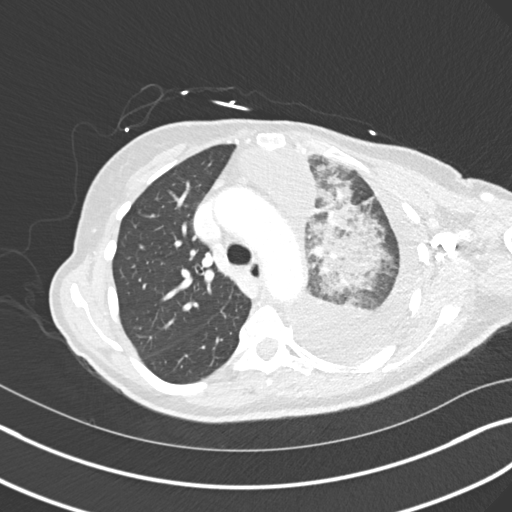
[im 129/168  lung]
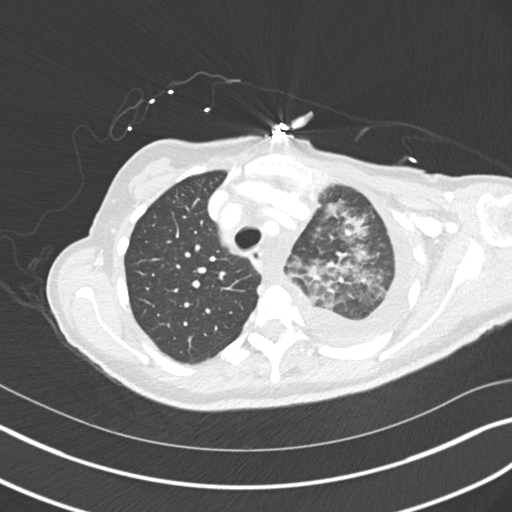
[im 142/168  lung]
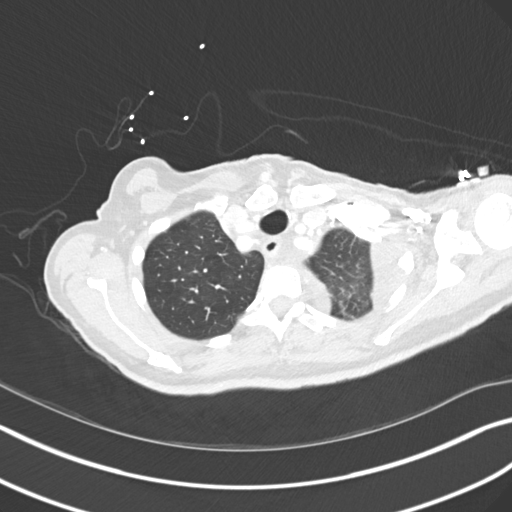
[im 155/168  lung]
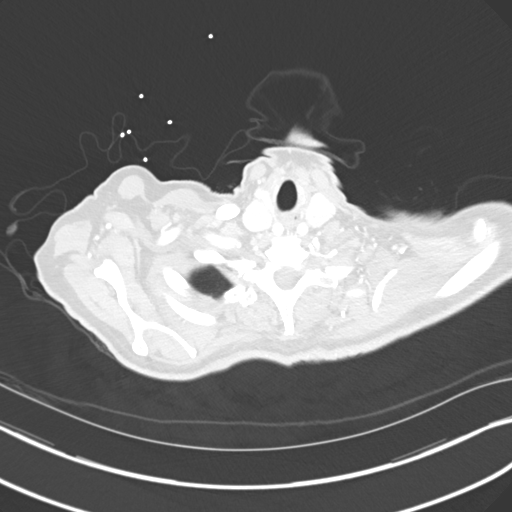

[Series 6: coronal · coronal · 0.68mm/px · 3 of 127 slices shown]
[im 26/127  lung]
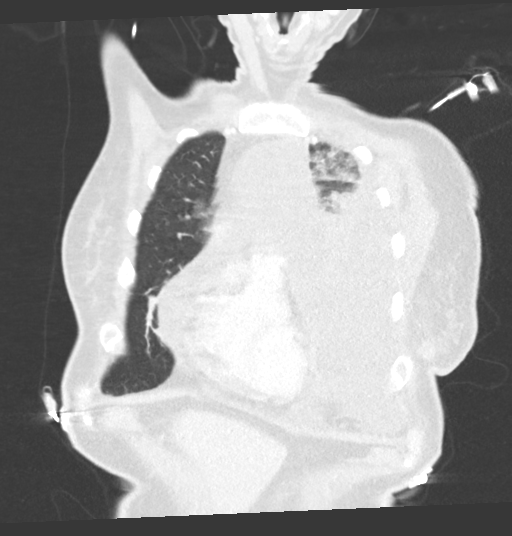
[im 51/127  lung]
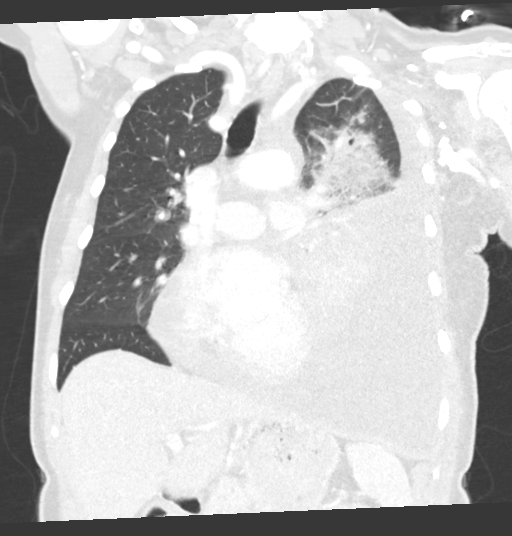
[im 76/127  lung]
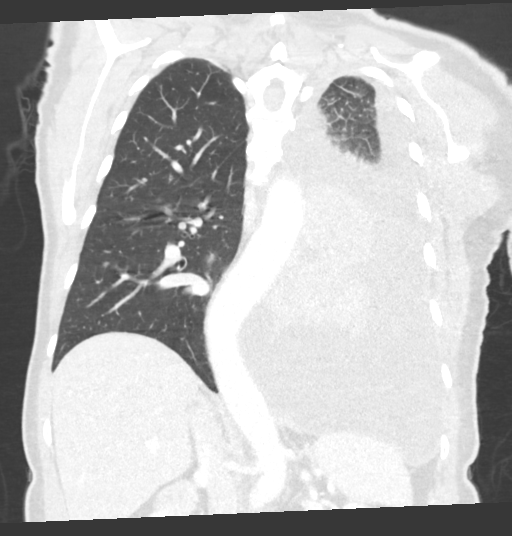

[15 of 36 positions shown; findings below may reference images not displayed]

FINDINGS: Cardiovascular: Heart is normal size. Small pericardial effusion.
Aorta is normal caliber.

Mediastinum/Nodes: No mediastinal, hilar, or axillary adenopathy.
Trachea and esophagus are unremarkable. Thyroid unremarkable.

Lungs/Pleura: Large left pleural effusion. Only the left upper lobe
is aerated with extensive airspace disease throughout the left upper
lobe. Left lower lobe is collapsed which could be related to
compressive atelectasis. No confluent opacity or effusion on the
right. Linear atelectasis or scarring in the right middle lobe.

Upper Abdomen: Imaging into the upper abdomen demonstrates no acute
findings.

Musculoskeletal: Chest wall soft tissues are unremarkable. There is
a new sclerotic lesion within the anterior L1 vertebral body. No
additional focal bone lesion or acute bony abnormality.
IMPRESSION: Large left pleural effusion. Complete opacification of the left
lower lobe with some aeration of the left upper lobe. Extensive
airspace disease throughout the left upper lobe. This could reflect
pneumonia or re-expansion edema following prior thoracentesis. No
pneumothorax.

Small pericardial effusion.

New sclerotic lesion anteriorly within the L1 vertebral body. Cannot
exclude sclerotic metastasis. Consider further evaluation with
nuclear medicine bone scan or MRI.

## 2021-01-19 MED ORDER — DRONABINOL 5 MG PO CAPS
5.0000 mg | ORAL_CAPSULE | Freq: Two times a day (BID) | ORAL | Status: DC
  Administered 2021-01-19 – 2021-01-22 (×5): 5 mg via ORAL
  Filled 2021-01-19 (×5): qty 1

## 2021-01-19 MED ORDER — ADULT MULTIVITAMIN W/MINERALS CH
1.0000 | ORAL_TABLET | Freq: Every day | ORAL | Status: DC
  Administered 2021-01-19 – 2021-01-22 (×3): 1 via ORAL
  Filled 2021-01-19 (×3): qty 1

## 2021-01-19 MED ORDER — LIDOCAINE HCL 1 % IJ SOLN
INTRAMUSCULAR | Status: AC
  Filled 2021-01-19: qty 20

## 2021-01-19 MED ORDER — ENSURE ENLIVE PO LIQD
237.0000 mL | Freq: Three times a day (TID) | ORAL | Status: DC
  Administered 2021-01-19 – 2021-01-21 (×3): 237 mL via ORAL

## 2021-01-19 MED ORDER — IOHEXOL 350 MG/ML SOLN
75.0000 mL | Freq: Once | INTRAVENOUS | Status: AC | PRN
  Administered 2021-01-19: 60 mL via INTRAVENOUS

## 2021-01-19 NOTE — Procedures (Addendum)
Ultrasound-guided diagnostic and therapeutic left thoracentesis performed yielding 2.4 liters of hazy, amber colored fluid. No immediate complications. Follow-up chest x-ray pending.EBL< 1 cc. A portion of the fluid was sent to the lab for cytology. Due to pt coughing/chest discomfort  only the above amount of fluid was removed today.

## 2021-01-19 NOTE — Progress Notes (Signed)
Progress Note    Devita Nies   CMK:[id number]  DOB: 10/22/1950  DOA: 01/18/2021     0  PCP: Gaynelle Cage, MD  Initial CC: SOB  Hospital Course: Treina Arscott is a 70 y.o. female with history of metastatic adenocarcinoma of the lung and also breast cancer has had 2D echo done following which patient was getting short of breath.  Patient has been having progressively worsening shortness of breath over the last few weeks.  Has known history of left pleural effusion.     the ER patient was mildly tachypneic and tachycardic.  Afebrile.  COVID test negative.  Chest x-ray shows complete opacification of the left side. She was admitted for thoracentesis and further work-up.  Interval History:  Seen in her room this morning prior to thoracentesis and again after.  Due to CXR being relatively unchanged after 2.4 L fluid drawn off of left pleural cavity, there was concern for possible underlying mucous plug or other obstruction.  Pulmonology was consulted.  Husband was present bedside in the afternoon and patient and husband are aware of plan.  ROS: Constitutional: negative for chills and fevers, Respiratory: positive for cough and sputum, Cardiovascular: negative for chest pain, and Gastrointestinal: negative for abdominal pain  Assessment & Plan: Left pleural effusion - History of non-small cell lung cancer, adenocarcinoma.  BRAF mutated per oncology -Presents with completely opacified left lung which was initially presumed due to a large pleural effusion however after 2.4 L fluid drawn off during thoracentesis on 01/19/2021, CXR remains essentially still opacified on the left side, raising concern for mucous plug or other obstruction -Discussed with pulmonology to help further weigh in on next steps and treatment - Place oxygen if needed.  Currently approximately 89% on room air -Follow-up cytology from thoracentesis  Moderate pericardial effusion  - seen in the 2D echo.  No signs of  tamponade.  Today also did show diastolic dysfunction.  Closely observe.  Type 2 diabetes - Continue SSI and CBG monitoring  Hypothyroidism - Continue Synthroid  Anemia - Likely related to malignancy - CBC daily  Old records reviewed in assessment of this patient  Antimicrobials:   DVT prophylaxis: SCDs Start: 01/18/21 2344   Code Status:   Code Status: Full Code Family Communication: Husband  Disposition Plan: Status is: Observation  The patient will require care spanning > 2 midnights and should be moved to inpatient because: Ongoing diagnostic testing needed not appropriate for outpatient work up and Inpatient level of care appropriate due to severity of illness  Dispo: The patient is from: Home              Anticipated d/c is to: Home              Patient currently is not medically stable to d/c.   Difficult to place patient No  Risk of unplanned readmission score:     Objective: Blood pressure 123/86, pulse 97, temperature 97.8 F (36.6 C), temperature source Oral, resp. rate 20, height 5' 2"  (1.575 m), weight 60.3 kg, SpO2 98 %.  Examination: General appearance: alert, cooperative, fatigued, and no distress Head: Normocephalic, without obvious abnormality, atraumatic Eyes:  EOMI Lungs:  dull left sided breath sounds both before and after thoracentesis, no wheezing Heart: regular rate and rhythm and S1, S2 normal Abdomen: normal findings: bowel sounds normal and soft, non-tender Extremities:  no edema Skin: mobility and turgor normal Neurologic: Grossly normal  Consultants:  Pulmonology   Procedures:  Thoracentesis, left, 01/19/21  Data Reviewed: I have personally reviewed following labs and imaging studies Results for orders placed or performed during the hospital encounter of 01/18/21 (from the past 24 hour(s))  Troponin I (High Sensitivity)     Status: None   Collection Time: 01/18/21  5:02 PM  Result Value Ref Range   Troponin I (High Sensitivity) 3  <18 ng/L  Basic metabolic panel     Status: Abnormal   Collection Time: 01/19/21  4:06 AM  Result Value Ref Range   Sodium 133 (L) 135 - 145 mmol/L   Potassium 3.9 3.5 - 5.1 mmol/L   Chloride 96 (L) 98 - 111 mmol/L   CO2 27 22 - 32 mmol/L   Glucose, Bld 107 (H) 70 - 99 mg/dL   BUN 13 8 - 23 mg/dL   Creatinine, Ser 0.57 0.44 - 1.00 mg/dL   Calcium 9.1 8.9 - 10.3 mg/dL   GFR, Estimated >60 >60 mL/min   Anion gap 10 5 - 15  CBC     Status: Abnormal   Collection Time: 01/19/21  4:06 AM  Result Value Ref Range   WBC 6.0 4.0 - 10.5 K/uL   RBC 3.39 (L) 3.87 - 5.11 MIL/uL   Hemoglobin 9.4 (L) 12.0 - 15.0 g/dL   HCT 30.1 (L) 36.0 - 46.0 %   MCV 88.8 80.0 - 100.0 fL   MCH 27.7 26.0 - 34.0 pg   MCHC 31.2 30.0 - 36.0 g/dL   RDW 15.6 (H) 11.5 - 15.5 %   Platelets 420 (H) 150 - 400 K/uL   nRBC 0.0 0.0 - 0.2 %  TSH     Status: Abnormal   Collection Time: 01/19/21  7:23 AM  Result Value Ref Range   TSH 12.988 (H) 0.350 - 4.500 uIU/mL  Glucose, capillary     Status: None   Collection Time: 01/19/21  7:37 AM  Result Value Ref Range   Glucose-Capillary 95 70 - 99 mg/dL  Glucose, capillary     Status: Abnormal   Collection Time: 01/19/21 11:41 AM  Result Value Ref Range   Glucose-Capillary 108 (H) 70 - 99 mg/dL    Recent Results (from the past 240 hour(s))  Resp Panel by RT-PCR (Flu A&B, Covid) Nasopharyngeal Swab     Status: None   Collection Time: 01/18/21  2:10 PM   Specimen: Nasopharyngeal Swab; Nasopharyngeal(NP) swabs in vial transport medium  Result Value Ref Range Status   SARS Coronavirus 2 by RT PCR NEGATIVE NEGATIVE Final    Comment: (NOTE) SARS-CoV-2 target nucleic acids are NOT DETECTED.  The SARS-CoV-2 RNA is generally detectable in upper respiratory specimens during the acute phase of infection. The lowest concentration of SARS-CoV-2 viral copies this assay can detect is 138 copies/mL. A negative result does not preclude SARS-Cov-2 infection and should not be used as  the sole basis for treatment or other patient management decisions. A negative result may occur with  improper specimen collection/handling, submission of specimen other than nasopharyngeal swab, presence of viral mutation(s) within the areas targeted by this assay, and inadequate number of viral copies(<138 copies/mL). A negative result must be combined with clinical observations, patient history, and epidemiological information. The expected result is Negative.  Fact Sheet for Patients:  EntrepreneurPulse.com.au  Fact Sheet for Healthcare Providers:  IncredibleEmployment.be  This test is no t yet approved or cleared by the [place] FDA and  has been authorized for detection and/or diagnosis of SARS-CoV-2 by FDA under an Emergency Use Authorization (EUA). This EUA  will remain  in effect (meaning this test can be used) for the duration of the COVID-19 declaration under Section 564(b)(1) of the Act, 21 U.S.C.section 360bbb-3(b)(1), unless the authorization is terminated  or revoked sooner.       Influenza A by PCR NEGATIVE NEGATIVE Final   Influenza B by PCR NEGATIVE NEGATIVE Final    Comment: (NOTE) The Xpert Xpress SARS-CoV-2/FLU/RSV plus assay is intended as an aid in the diagnosis of influenza from Nasopharyngeal swab specimens and should not be used as a sole basis for treatment. Nasal washings and aspirates are unacceptable for Xpert Xpress SARS-CoV-2/FLU/RSV testing.  Fact Sheet for Patients: EntrepreneurPulse.com.au  Fact Sheet for Healthcare Providers: IncredibleEmployment.be  This test is not yet approved or cleared by the [place] FDA and has been authorized for detection and/or diagnosis of SARS-CoV-2 by FDA under an Emergency Use Authorization (EUA). This EUA will remain in effect (meaning this test can be used) for the duration of the COVID-19 declaration under Section 564(b)(1) of  the Act, 21 U.S.C. section 360bbb-3(b)(1), unless the authorization is terminated or revoked.  Performed at [facility], [address] [place], [place] [address]      Radiology Studies: DG Chest 1 View  Result Date: 01/19/2021 CLINICAL DATA:  Status post thoracentesis.  2.4 L removed. EXAM: CHEST  1 VIEW COMPARISON:  Chest radiograph 01/18/2021 FINDINGS: Post LEFT thoracentesis with return of the mediastinal structures to midline following fluid removal. A small amount of aerated lung in the LEFT upper lobe is not demonstrated although there is continued near complete opacification of the LEFT hemithorax. No pneumothorax evident. RIGHT lung is clear without edema or pneumothorax. IMPRESSION: 1. Return of mediastinal structures to midline following thoracentesis. 2. A small amount of aerated lung is now evident in LEFT upper lobe following thoracentesis. LEFT hemithorax remains near completely opacified. 3. No pneumothorax. Electronically Signed   By: Suzy Bouchard M.D.   On: 01/19/2021 10:38   DG Chest Port 1 View  Result Date: 01/18/2021 CLINICAL DATA:  Shortness of breath.  Stage IV lung cancer EXAM: PORTABLE CHEST 1 VIEW COMPARISON:  04/30/2020 FINDINGS: Complete opacification of the left hemithorax with rightward displacement of the heart and mediastinal structures. The visualized portions of the right lung are grossly clear. No appreciable right-sided pleural fluid collection. No pneumothorax. IMPRESSION: Complete opacification of the left hemithorax. Findings likely represent a combination of a large pleural effusion and atelectasis. Electronically Signed   By: Davina Poke D.O.   On: 01/18/2021 14:18   ECHOCARDIOGRAM COMPLETE  Result Date: 01/18/2021    ECHOCARDIOGRAM REPORT   Patient Name:   KARNISHA LEFEBRE Date of Exam: 01/18/2021 Medical Rec #:  [id number]          Height:       62.0 in Accession #:    [id number]         Weight:       133.0 lb Date of Birth:  09/11/1950           BSA:          1.607 m Patient Age:    62 years           BP:           165/110 mmHg Patient Gender: F                  HR:           101 bpm. Exam Location:  [place]  Procedure: 2D Echo, Cardiac Doppler, Color Doppler and Strain Analysis                    STAT ECHO Reported to: Dr Stanford Breed on 01/18/2021 1:35:00 PM                   tamponade. Indications:    Pericardial effusion  History:        Patient has no prior history of Echocardiogram examinations.  Sonographer:    Luisa Hart RDCS Referring Phys: [place]  1. Left ventricular ejection fraction, by estimation, is 55 to 60%. The left ventricle has normal function. The left ventricle has no regional wall motion abnormalities. Left ventricular diastolic parameters are consistent with Grade I diastolic dysfunction (impaired relaxation).  2. Right ventricular systolic function is normal. The right ventricular size is normal.  3. Grossly no evidence of tamponade. Moderate pericardial effusion. The pericardial effusion is circumferential. Large pleural effusion in the left lateral region.  4. The mitral valve is normal in structure. Mild mitral valve regurgitation. No evidence of mitral stenosis.  5. The aortic valve is normal in structure. Aortic valve regurgitation is not visualized. No aortic stenosis is present.  6. The inferior vena cava is normal in size with greater than 50% respiratory variability, suggesting right atrial pressure of 3 mmHg. FINDINGS  Left Ventricle: Left ventricular ejection fraction, by estimation, is 55 to 60%. The left ventricle has normal function. The left ventricle has no regional wall motion abnormalities. The left ventricular internal cavity size was normal in size. There is  no left ventricular hypertrophy. Left ventricular diastolic parameters are consistent with Grade I diastolic dysfunction (impaired relaxation). Right Ventricle: The right ventricular size is normal. No increase in right  ventricular wall thickness. Right ventricular systolic function is normal. Left Atrium: Left atrial size was normal in size. Right Atrium: Right atrial size was normal in size. Pericardium: Grossly no evidence of tamponade. A moderately sized pericardial effusion is present. The pericardial effusion is circumferential. Mitral Valve: The mitral valve is normal in structure. Mild mitral valve regurgitation. No evidence of mitral valve stenosis. Tricuspid Valve: The tricuspid valve is normal in structure. Tricuspid valve regurgitation is not demonstrated. No evidence of tricuspid stenosis. Aortic Valve: The aortic valve is normal in structure. Aortic valve regurgitation is not visualized. No aortic stenosis is present. Aortic valve mean gradient measures 2.0 mmHg. Aortic valve peak gradient measures 6.2 mmHg. Pulmonic Valve: The pulmonic valve was normal in structure. Pulmonic valve regurgitation is not visualized. No evidence of pulmonic stenosis. Aorta: The aortic root is normal in size and structure. Venous: The inferior vena cava is normal in size with greater than 50% respiratory variability, suggesting right atrial pressure of 3 mmHg. IAS/Shunts: No atrial level shunt detected by color flow Doppler. Additional Comments: There is a large pleural effusion in the left lateral region.   LV Volumes (MOD) LV vol d, MOD A2C: 51.1 ml Diastology LV vol d, MOD A4C: 48.4 ml LV e' medial:    4.68 cm/s LV vol s, MOD A2C: 37.5 ml LV E/e' medial:  14.0 LV vol s, MOD A4C: 26.8 ml LV e' lateral:   5.33 cm/s LV SV MOD A2C:     13.6 ml LV E/e' lateral: 12.3 LV SV MOD A4C:     48.4 ml LV SV MOD BP:      18.8 ml LEFT ATRIUM  Index LA Vol (A2C):   33.3 ml 20.72 ml/m LA Vol (A4C):   28.1 ml 17.48 ml/m LA Biplane Vol: 31.5 ml 19.60 ml/m  AORTIC VALVE AV Vmax:           125.00 cm/s AV Vmean:          71.300 cm/s AV VTI:            0.176 m AV Peak Grad:      6.2 mmHg AV Mean Grad:      2.0 mmHg LVOT Vmax:         80.00 cm/s  LVOT Vmean:        53.100 cm/s LVOT VTI:          0.116 m LVOT/AV VTI ratio: 0.66 MITRAL VALVE MV Area (PHT): 4.39 cm    SHUNTS MV Decel Time: 173 msec    Systemic VTI: 0.12 m MR Peak grad: 151.8 mmHg MR Vmax:      616.00 cm/s MV E velocity: 65.60 cm/s MV A velocity: 93.50 cm/s MV E/A ratio:  0.70 Jenne Campus MD Electronically signed by Jenne Campus MD Signature Date/Time: 01/18/2021/3:59:55 PM    Final    US THORACENTESIS ASP PLEURAL SPACE W/IMG GUIDE  Result Date: 01/19/2021 INDICATION: Patient with history of metastatic lung cancer, dyspnea, left pleural effusion. Request received for diagnostic and therapeutic left thoracentesis. EXAM: ULTRASOUND GUIDED DIAGNOSTIC AND THERAPEUTIC LEFT THORACENTESIS MEDICATIONS: 1% lidocaine to skin and subcutaneous tissue COMPLICATIONS: None immediate. PROCEDURE: An ultrasound guided thoracentesis was thoroughly discussed with the patient and questions answered. The benefits, risks, alternatives and complications were also discussed. The patient understands and wishes to proceed with the procedure. Written consent was obtained. Ultrasound was performed to localize and mark an adequate pocket of fluid in the left chest. The area was then prepped and draped in the normal sterile fashion. 1% Lidocaine was used for local anesthesia. Under ultrasound guidance a 6 Fr Safe-T-Centesis catheter was introduced. Thoracentesis was performed. The catheter was removed and a dressing applied. FINDINGS: A total of approximately 2.4 liters of hazy,amber fluid was removed. Samples were sent to the laboratory as requested by the clinical team. Due to pt coughing /chest discomfort only the above amount of fluid was removed today. IMPRESSION: Successful ultrasound guided diagnostic and therapeutic left thoracentesis yielding 2.4 liters of pleural fluid. Read by: Rowe Robert, PA-C Electronically Signed   By: Jerilynn Mages.  Shick M.D.   On: 01/19/2021 10:21   US THORACENTESIS ASP PLEURAL SPACE W/IMG  GUIDE  Final Result    DG Chest 1 View  Final Result    DG Chest Port 1 View  Final Result      Scheduled Meds:  dabrafenib mesylate  150 mg Oral BID   dronabinol  5 mg Oral BID AC   insulin aspart  0-9 Units Subcutaneous TID WC   letrozole  2.5 mg Oral Daily   lidocaine       thyroid  60 mg Oral Daily   trametinib dimethyl sulfoxide  2 mg Oral Daily   PRN Meds: acetaminophen **OR** acetaminophen, ibuprofen Continuous Infusions:   LOS: 0 days  Time spent: Greater than 50% of the 35 minute visit was spent in counseling/coordination of care for the patient as laid out in the A&P.   Dwyane Dee, MD [facility] 01/19/2021, 2:41 PM

## 2021-01-19 NOTE — H&P (View-Only) (Signed)
NAMEKanasia Stone, MRN:  [id number], DOB:  02/17/1951, LOS: 0 ADMISSION DATE:  01/18/2021, CONSULTATION DATE:  8/9 REFERRING MD: Dr. Sabino Gasser, CHIEF COMPLAINT: SOB   History of Present Illness:  70 y/o F who presented to [facility] ER on 8/8 with reports of at least one month of shortness of breath.   She is followed at [facility] by Dr. Arnoldo Morale for Oncology.  She has not been taking her prescribed medications regularly as she "does not want it to compromise her immune system".  She is followed by an Integrative Medicine Specialist who prescribed Ivermectin oil and "Protect All" as additional therapy.  She sought a second opinion from Dr. Marin Olp.    The patient reports she has been short of breath for at least one month and has had a dry cough during that time.  In the last two weeks she has began producing thick sputum.  She notes a 40 lb weight loss since November.  She has had progressive difficulty with shortness of breath that interferes with her activity tolerance.  She has had a pleural effusion present at least since 11/2020.  On presentation, she was tachypneic and tachycardic.  COVID testing was negative.  CXR showed complete opacification of the left side with mediastinal shift to the right.  She was admitted per [facility] for further evaluation.  A thoracentesis was completed by IR with 2.4L fluid removed. Cytology pending.  Post thora CXR evaluation shows residual opacification of the left hemithorax but without mediastinal shift.   PCCM consulted for pulmonary evaluation.   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   S/P thora with 2.4L removed.  8/9 PCCM consulted  Interim History / Subjective:  As above.  Objective   Blood pressure 123/86, pulse 97, temperature 97.8 F (36.6 C), temperature source Oral, resp. rate 20, height 5' 2"  (1.575 m), weight 60.3 kg, SpO2 98 %.       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  Left Pleural Effusion with Opacification of Hemithorax  Invasive Ductal Breast Cancer   -CT chest with contrast to evaluate for airway obstruction  -plan for possible FOB in am 8/10 to evaluate airways, planned for 10 am pending CT review  -follow up pleural fluid cytology   -chest PT as tolerated, flutter valve.  Less likely mucus plugging.   -O2 if needed to support saturations >90% -encouraged use of appropriate oncologic therapy   Anemia  -per primary   DM II  -per primary   Best Practice (right click and "Reselect all SmartList Selections" daily)   Diet/type: Regular consistency (  see orders) DVT prophylaxis: not indicated GI prophylaxis: N/A Lines: N/A Foley:  N/A Code Status:  full code Last date of multidisciplinary goals of care discussion: per primary   Labs   CBC: Recent Labs  Lab 01/18/21 1400 01/19/21 0406  WBC 6.1 6.0  NEUTROABS 3.9  --   HGB 10.9* 9.4*  HCT 34.0* 30.1*  MCV 86.3 88.8  PLT 531* 420*    Basic Metabolic Panel: Recent Labs  Lab 01/18/21 1400 01/19/21 0406  NA 134* 133*  K 3.7 3.9  CL 95* 96*   CO2 28 27  GLUCOSE 123* 107*  BUN 13 13  CREATININE 0.60 0.57  CALCIUM 9.1 9.1   GFR: Estimated Creatinine Clearance: 55.2 mL/min (by C-G formula based on SCr of 0.57 mg/dL). Recent Labs  Lab 01/18/21 1400 01/19/21 0406  WBC 6.1 6.0    Liver Function Tests: Recent Labs  Lab 01/18/21 1400  AST 17  ALT 14  ALKPHOS 105  BILITOT 0.3  PROT 7.3  ALBUMIN 3.3*   No results for input(s): LIPASE, AMYLASE in the last 168 hours. No results for input(s): AMMONIA in the last 168 hours.  ABG No results found for: PHART, PCO2ART, PO2ART, HCO3, TCO2, ACIDBASEDEF, O2SAT   Coagulation Profile: No results for input(s): INR, PROTIME in the last 168 hours.  Cardiac Enzymes: No results for input(s): CKTOTAL, CKMB, CKMBINDEX, TROPONINI in the last 168 hours.  HbA1C: No results found for: HGBA1C  CBG: Recent Labs  Lab 01/19/21 0737 01/19/21 1141  GLUCAP 95 108*    Review of Systems: Positives in bold  Gen: Denies fever, chills, weight change, fatigue, night sweats HEENT: Denies blurred vision, double vision, hearing loss, tinnitus, sinus congestion, rhinorrhea, sore throat, neck stiffness, dysphagia PULM: Denies shortness of breath, cough, sputum production, hemoptysis, wheezing CV: Denies chest pain, edema, orthopnea, paroxysmal nocturnal dyspnea, palpitations GI: Denies abdominal pain, nausea, vomiting, diarrhea, hematochezia, melena, constipation, change in bowel habits GU: Denies dysuria, hematuria, polyuria, oliguria, urethral discharge Endocrine: Denies hot or cold intolerance, polyuria, polyphagia or appetite change Derm: Denies rash, dry skin, scaling or peeling skin change Heme: Denies easy bruising, bleeding, bleeding gums Neuro: Denies headache, numbness, weakness, slurred speech, loss of memory or consciousness   Past Medical History:  She,  has a past medical history of Cancer ([place]), Diabetes ([place]), and High cholesterol.   Surgical History:   Past Surgical  History:  Procedure Laterality Date   BREAST BIOPSY Right    BRONCHIAL BIOPSY  04/30/2020   Procedure: BRONCHIAL BIOPSIES;  Surgeon: Collene Gobble, MD;  Location: [place];  Service: Cardiopulmonary;;   BRONCHIAL BRUSHINGS  04/30/2020   Procedure: BRONCHIAL BRUSHINGS;  Surgeon: Collene Gobble, MD;  Location: [place];  Service: Cardiopulmonary;;   HEMOSTASIS CONTROL  04/30/2020   Procedure: HEMOSTASIS CONTROL;  Surgeon: Collene Gobble, MD;  Location: [place];  Service: Cardiopulmonary;;   VIDEO BRONCHOSCOPY N/A 04/30/2020   Procedure: VIDEO BRONCHOSCOPY WITH FLUORO;  Surgeon: Collene Gobble, MD;  Location: [place];  Service: Cardiopulmonary;  Laterality: N/A;     Social History:   reports that she has never smoked. She has never used smokeless tobacco. She reports previous alcohol use. She reports that she does not use drugs.   Family History:  Her family history includes Breast cancer in her sister.   Allergies No Known Allergies   Home Medications  Prior to Admission medications   Medication Sig Start Date End Date Taking? Authorizing Provider  Coenzyme Q10 (CO  Q 10 PO) Take 300 mg by mouth daily.   Yes [provider]  dabrafenib mesylate (TAFINLAR) 75 MG capsule Take 2 capsules (150 mg total) by mouth 2 (two) times daily. Take on an empty stomach 1 hour before or 2 hours after meals. 06/02/20  Yes Curt Bears, MD  DANDELION PO Take 1 capsule by mouth daily.   Yes [provider]  ibuprofen (ADVIL) 200 MG tablet Take 400 mg by mouth every 6 (six) hours as needed for mild pain.   Yes [provider]  IVERMECTIN PO Take 1.8 mLs by mouth daily. Ivermectin oil - 10 mg/ml   Yes [provider]  letrozole (FEMARA) 2.5 MG tablet Take 2.5 mg by mouth daily. 12/22/20  Yes [provider]  metFORMIN (GLUCOPHAGE-XR) 750 MG 24 hr tablet Take 750 mg by mouth 2 (two) times daily.   Yes [provider]  Misc Natural  Products (MAGIC MUSHROOM MIX) CAPS Take 1 tablet by mouth daily.   Yes [provider]  Multiple Vitamins-Minerals (MULTIVITAMIN WITH MINERALS) tablet Take 1 tablet by mouth daily.   Yes [provider]  NP THYROID 60 MG tablet Take 60 mg by mouth daily. 02/24/20  Yes [provider]  OVER THE COUNTER MEDICATION Take 2 capsules by mouth 3 (three) times daily. Protectival   Yes [provider]  trametinib dimethyl sulfoxide (MEKINIST) 2 MG tablet Take 1 tablet (2 mg total) by mouth daily. Take 1 hour before or 2 hours after a meal. Store refrigerated in original container. 06/02/20  Yes Curt Bears, MD  Zinc 100 MG TABS Take 100 mg by mouth daily.   Yes [provider]     Critical care time: n/a    Noe Gens, MSN, APRN, NP-C, AGACNP-BC  Pulmonary & Critical Care 01/19/2021, 3:24 PM   Please see [email or web address] for pager details.   From 7A-7P if no response, please call [phone] After hours, please call ELink [phone]

## 2021-01-19 NOTE — Consult Note (Signed)
Referral MD  Reason for Referral: Recurrent left pleural effusion-non-small cell lung cancer-adenocarcinoma-BRAF mutated  Chief Complaint  Patient presents with   Shortness of Breath  : I was having worsening shortness of breath.  HPI: Tami Stone is a very nice 70 year old white female.  I saw her initially back in early July.  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  Thankfully, her tumor was BRAF mutated.  She was placed on Tafinlar and Mekinist.  Is hard to say if she has been taking these religiously.  I know that she has always felt that complementary/alternative therapy would be reasonable.  She takes ivermectin.  She and husband are actually at [facility] last week.  They were down on Monday.  As we progressed, she became more short of breath.  She had to come back a day early.  She called the office yesterday.  She was to have a echocardiogram done.  We told her to get chest x-ray while she is having the echocardiogram.  Got a message from the echocardiogram suite that she has been sent to the emergency room because of difficulties.  She had a chest x-ray which did show a large left pleural effusion.  The echocardiogram actually showed her heart to be in pretty decent shape.  Showed left ventricular ejection fraction of 55-60%.  Her lab work yesterday showed a white count 6.1.  Hemoglobin 10.9.  Platelet count 531,000.  Her BUN was 13 creatinine 0.6.  Calcium 9.1 with an albumin of 3.3.  She had a troponin I of 3.  Her beta natruretic peptide was 16.6.  Hopefully, she will have the fluid taken off today.  I am sure that it is malignant.  I think the real issue or question is how intelligent and she been taking the [place] and Tafinlar.  Studies have shown that patients who have BRAF mutated cancers do quite well with targeted therapy.  She has had a bit of a cough.  There is no hemoptysis.  She has had no diarrhea.  She has had no nausea or  vomiting.  She has had no rashes.  She has had no fever.  I know that she will get incredible care from everybody up on 4 [address].  Overall, her performance status is ECOG 1.    Past Medical History:  Diagnosis Date   Cancer ([place])    Diabetes ([place])    High cholesterol   :   Past Surgical History:  Procedure Laterality Date   BREAST BIOPSY Right    BRONCHIAL BIOPSY  04/30/2020   Procedure: BRONCHIAL BIOPSIES;  Surgeon: Collene Gobble, MD;  Location: [place];  Service: Cardiopulmonary;;   BRONCHIAL BRUSHINGS  04/30/2020   Procedure: BRONCHIAL BRUSHINGS;  Surgeon: Collene Gobble, MD;  Location: [place];  Service: Cardiopulmonary;;   HEMOSTASIS CONTROL  04/30/2020   Procedure: HEMOSTASIS CONTROL;  Surgeon: Collene Gobble, MD;  Location: [place];  Service: Cardiopulmonary;;   VIDEO BRONCHOSCOPY N/A 04/30/2020   Procedure: VIDEO BRONCHOSCOPY WITH FLUORO;  Surgeon: Collene Gobble, MD;  Location: [place];  Service: Cardiopulmonary;  Laterality: N/A;  :   Current Facility-Administered Medications:    acetaminophen (TYLENOL) tablet 650 mg, 650 mg, Oral, Q6H PRN **OR** acetaminophen (TYLENOL) suppository 650 mg, 650 mg, Rectal, Q6H PRN, Rise Patience, MD   dabrafenib mesylate (TAFINLAR) capsule 150 mg, 150 mg, Oral, BID, Rise Patience, MD   ibuprofen (ADVIL) tablet 400 mg, 400 mg, Oral, Q6H PRN, Hal Hope,  Doreatha Lew, MD, 400 mg at 01/19/21 0125   insulin aspart (novoLOG) injection 0-9 Units, 0-9 Units, Subcutaneous, TID WC, Rise Patience, MD   letrozole [facility]) tablet 2.5 mg, 2.5 mg, Oral, Daily, Rise Patience, MD   thyroid (ARMOUR) tablet 60 mg, 60 mg, Oral, Daily, Rise Patience, MD   trametinib dimethyl sulfoxide (MEKINIST) tablet 2 mg, 2 mg, Oral, Daily, Rise Patience, MD:   dabrafenib mesylate  150 mg Oral BID   insulin aspart  0-9 Units Subcutaneous TID WC   letrozole  2.5 mg Oral Daily   thyroid  60 mg Oral Daily    trametinib dimethyl sulfoxide  2 mg Oral Daily  :  No Known Allergies:   Family History  Problem Relation Age of Onset   Breast cancer Sister        half sister on maternal side  :   Social History   Socioeconomic History   Marital status: Married    Spouse name: Not on file   Number of children: Not on file   Years of education: Not on file   Highest education level: Not on file  Occupational History   Not on file  Tobacco Use   Smoking status: Never   Smokeless tobacco: Never  Vaping Use   Vaping Use: Never used  Substance and Sexual Activity   Alcohol use: Not Currently   Drug use: Never   Sexual activity: Not on file  Other Topics Concern   Not on file  Social History Narrative   Not on file   Social Determinants of Health   Financial Resource Strain: Not on file  Food Insecurity: Not on file  Transportation Needs: Not on file  Physical Activity: Not on file  Stress: Not on file  Social Connections: Not on file  Intimate Partner Violence: Not on file  :  Review of Systems  Constitutional:  Positive for malaise/fatigue.  HENT: Negative.    Eyes: Negative.   Respiratory:  Positive for shortness of breath.   Cardiovascular: Negative.   Gastrointestinal: Negative.   Genitourinary: Negative.   Musculoskeletal: Negative.   Skin: Negative.   Neurological: Negative.   Endo/Heme/Allergies: Negative.   Psychiatric/Behavioral: Negative.      Exam:  Physical Exam Vitals reviewed.  HENT:     Head: Normocephalic and atraumatic.  Eyes:     Pupils: Pupils are equal, round, and reactive to light.  Cardiovascular:     Rate and Rhythm: Normal rate and regular rhythm.     Heart sounds: Normal heart sounds.  Pulmonary:     Effort: Pulmonary effort is normal.     Breath sounds: Examination of the left-upper field reveals decreased breath sounds. Examination of the left-middle field reveals decreased breath sounds. Examination of the left-lower field reveals  decreased breath sounds. Decreased breath sounds present.  Abdominal:     General: Bowel sounds are normal.     Palpations: Abdomen is soft.  Musculoskeletal:        General: No tenderness or deformity. Normal range of motion.     Cervical back: Normal range of motion.  Lymphadenopathy:     Cervical: No cervical adenopathy.  Skin:    General: Skin is warm and dry.     Findings: No erythema or rash.  Neurological:     Mental Status: She is alert and oriented to person, place, and time.  Psychiatric:        Behavior: Behavior normal.  Thought Content: Thought content normal.        Judgment: Judgment normal.    Patient Vitals for the past 24 hrs:  BP Temp Temp src Pulse Resp SpO2 Height Weight  01/19/21 0624 -- 97.8 F (36.6 C) Oral 85 16 -- -- --  01/19/21 0623 (!) 122/92 97.8 F (36.6 C) Oral 83 16 96 % -- --  01/19/21 0146 (!) 142/106 97.9 F (36.6 C) Oral 100 18 100 % -- --  01/19/21 0115 (!) 138/95 -- -- (!) 101 (!) 23 100 % -- --  01/19/21 0100 (!) 131/99 -- -- (!) 102 17 100 % -- --  01/19/21 0030 135/89 -- -- 96 (!) 34 99 % -- --  01/19/21 0000 (!) 136/105 -- -- (!) 106 (!) 25 96 % -- --  01/18/21 2330 (!) 132/100 -- -- (!) 101 (!) 31 100 % -- --  01/18/21 2304 (!) 125/93 -- -- (!) 102 (!) 38 100 % -- --  01/18/21 2215 (!) 128/101 -- -- -- (!) 28 -- -- --  01/18/21 2200 (!) 134/99 -- -- 94 (!) 21 100 % -- --  01/18/21 2100 (!) 139/104 -- -- 100 (!) 32 99 % -- --  01/18/21 2008 (!) 142/95 -- -- 95 (!) 37 97 % -- --  01/18/21 1915 -- -- -- 97 (!) 25 100 % -- --  01/18/21 1900 (!) 147/95 -- -- 95 19 96 % -- --  01/18/21 1830 (!) 146/95 -- -- 96 (!) 27 100 % -- --  01/18/21 1815 (!) 134/96 -- -- 93 (!) 37 100 % -- --  01/18/21 1800 (!) 145/93 -- -- 95 (!) 33 100 % -- --  01/18/21 1745 (!) 147/95 -- -- 96 (!) 33 100 % -- --  01/18/21 1730 (!) 141/104 -- -- 100 (!) 22 100 % -- --  01/18/21 1715 (!) 147/103 -- -- 98 (!) 22 100 % -- --  01/18/21 1658 (!) 155/106 --  -- 97 (!) 26 100 % -- --  01/18/21 1545 (!) 161/128 -- -- 93 (!) 45 100 % -- --  01/18/21 1530 (!) 161/110 -- -- 92 (!) 29 100 % -- --  01/18/21 1428 -- -- -- -- -- 100 % -- --  01/18/21 1415 (!) 152/105 -- -- 90 (!) 37 100 % -- --  01/18/21 1335 (!) 169/117 98 F (36.7 C) Oral 95 18 95 % -- --  01/18/21 1333 -- -- -- -- -- -- 5' 2"  (1.575 m) 132 lb 15 oz (60.3 kg)      Recent Labs    01/18/21 1400 01/19/21 0406  WBC 6.1 6.0  HGB 10.9* 9.4*  HCT 34.0* 30.1*  PLT 531* 420*    Recent Labs    01/18/21 1400 01/19/21 0406  NA 134* 133*  K 3.7 3.9  CL 95* 96*  CO2 28 27  GLUCOSE 123* 107*  BUN 13 13  CREATININE 0.60 0.57  CALCIUM 9.1 9.1    Blood smear review: None  Pathology: None    Assessment and Plan: Tami Stone is a very nice 70 year old white female with metastatic non-small cell lung cancer.  She now has a large left pleural effusion.  I would have to say this is recurrence.  Again, it is hard to say how diligent she has been taking her medications.  We really have to get as much fluid taken off as possible.  I really do not think she would go along with a Pleurx  catheter.  We really have to get her back on to the [place] and [place].  I am not sure if this can be done with her in the hospital.  Again her tumor is PD-L1 positive.  As such, immunotherapy could always be considered as a treatment modality.  1 possibility might be to actually combine the targeted therapy with the immunotherapy.  Her appetite is not that great.  I will try her on some Marinol to see if this might help her appetite.  She really like to go home as soon as possible.  I told her it probably would be another day or so before she would be able to go home.  I am glad that the echocardiogram did not show [place] much.  She seems to have a good ejection fraction so that is always good.  It would be worthwhile trying to check her TSH to make sure that her thyroid is not underactive.  I  know that she has a strong constitution.  Again, I appreciate all the hard work being done by all the staff on 4 W.  Lattie Haw, MD  Proverbs 11:25

## 2021-01-19 NOTE — Consult Note (Signed)
NAMEEmiyah Stone, MRN:  [id number], DOB:  Nov 08, 1950, LOS: 0 ADMISSION DATE:  01/18/2021, CONSULTATION DATE:  8/9 REFERRING MD: Dr. Sabino Gasser, CHIEF COMPLAINT: SOB   History of Present Illness:  70 y/o F who presented to [facility] ER on 8/8 with reports of at least one month of shortness of breath.   She is followed at [facility] by Dr. Arnoldo Morale for Oncology.  She has not been taking her prescribed medications regularly as she "does not want it to compromise her immune system".  She is followed by an Integrative Medicine Specialist who prescribed Ivermectin oil and "Protect All" as additional therapy.  She sought a second opinion from Dr. Marin Olp.    The patient reports she has been short of breath for at least one month and has had a dry cough during that time.  In the last two weeks she has began producing thick sputum.  She notes a 40 lb weight loss since November.  She has had progressive difficulty with shortness of breath that interferes with her activity tolerance.  She has had a pleural effusion present at least since 11/2020.  On presentation, she was tachypneic and tachycardic.  COVID testing was negative.  CXR showed complete opacification of the left side with mediastinal shift to the right.  She was admitted per [facility] for further evaluation.  A thoracentesis was completed by IR with 2.4L fluid removed. Cytology pending.  Post thora CXR evaluation shows residual opacification of the left hemithorax but without mediastinal shift.   PCCM consulted for pulmonary evaluation.   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   S/P thora with 2.4L removed.  8/9 PCCM consulted  Interim History / Subjective:  As above.  Objective   Blood pressure 123/86, pulse 97, temperature 97.8 F (36.6 C), temperature source Oral, resp. rate 20, height 5' 2"  (1.575 m), weight 60.3 kg, SpO2 98 %.       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  Left Pleural Effusion with Opacification of Hemithorax  Invasive Ductal Breast Cancer   -CT chest with contrast to evaluate for airway obstruction  -plan for possible FOB in am 8/10 to evaluate airways, planned for 10 am pending CT review  -follow up pleural fluid cytology   -chest PT as tolerated, flutter valve.  Less likely mucus plugging.   -O2 if needed to support saturations >90% -encouraged use of appropriate oncologic therapy   Anemia  -per primary   DM II  -per primary   Best Practice (right click and "Reselect all SmartList Selections" daily)   Diet/type: Regular consistency (  see orders) DVT prophylaxis: not indicated GI prophylaxis: N/A Lines: N/A Foley:  N/A Code Status:  full code Last date of multidisciplinary goals of care discussion: per primary   Labs   CBC: Recent Labs  Lab 01/18/21 1400 01/19/21 0406  WBC 6.1 6.0  NEUTROABS 3.9  --   HGB 10.9* 9.4*  HCT 34.0* 30.1*  MCV 86.3 88.8  PLT 531* 420*    Basic Metabolic Panel: Recent Labs  Lab 01/18/21 1400 01/19/21 0406  NA 134* 133*  K 3.7 3.9  CL 95* 96*   CO2 28 27  GLUCOSE 123* 107*  BUN 13 13  CREATININE 0.60 0.57  CALCIUM 9.1 9.1   GFR: Estimated Creatinine Clearance: 55.2 mL/min (by C-G formula based on SCr of 0.57 mg/dL). Recent Labs  Lab 01/18/21 1400 01/19/21 0406  WBC 6.1 6.0    Liver Function Tests: Recent Labs  Lab 01/18/21 1400  AST 17  ALT 14  ALKPHOS 105  BILITOT 0.3  PROT 7.3  ALBUMIN 3.3*   No results for input(s): LIPASE, AMYLASE in the last 168 hours. No results for input(s): AMMONIA in the last 168 hours.  ABG No results found for: PHART, PCO2ART, PO2ART, HCO3, TCO2, ACIDBASEDEF, O2SAT   Coagulation Profile: No results for input(s): INR, PROTIME in the last 168 hours.  Cardiac Enzymes: No results for input(s): CKTOTAL, CKMB, CKMBINDEX, TROPONINI in the last 168 hours.  HbA1C: No results found for: HGBA1C  CBG: Recent Labs  Lab 01/19/21 0737 01/19/21 1141  GLUCAP 95 108*    Review of Systems: Positives in bold  Gen: Denies fever, chills, weight change, fatigue, night sweats HEENT: Denies blurred vision, double vision, hearing loss, tinnitus, sinus congestion, rhinorrhea, sore throat, neck stiffness, dysphagia PULM: Denies shortness of breath, cough, sputum production, hemoptysis, wheezing CV: Denies chest pain, edema, orthopnea, paroxysmal nocturnal dyspnea, palpitations GI: Denies abdominal pain, nausea, vomiting, diarrhea, hematochezia, melena, constipation, change in bowel habits GU: Denies dysuria, hematuria, polyuria, oliguria, urethral discharge Endocrine: Denies hot or cold intolerance, polyuria, polyphagia or appetite change Derm: Denies rash, dry skin, scaling or peeling skin change Heme: Denies easy bruising, bleeding, bleeding gums Neuro: Denies headache, numbness, weakness, slurred speech, loss of memory or consciousness   Past Medical History:  She,  has a past medical history of Cancer ([place]), Diabetes ([place]), and High cholesterol.   Surgical History:   Past Surgical  History:  Procedure Laterality Date   BREAST BIOPSY Right    BRONCHIAL BIOPSY  04/30/2020   Procedure: BRONCHIAL BIOPSIES;  Surgeon: Collene Gobble, MD;  Location: [place];  Service: Cardiopulmonary;;   BRONCHIAL BRUSHINGS  04/30/2020   Procedure: BRONCHIAL BRUSHINGS;  Surgeon: Collene Gobble, MD;  Location: [place];  Service: Cardiopulmonary;;   HEMOSTASIS CONTROL  04/30/2020   Procedure: HEMOSTASIS CONTROL;  Surgeon: Collene Gobble, MD;  Location: [place];  Service: Cardiopulmonary;;   VIDEO BRONCHOSCOPY N/A 04/30/2020   Procedure: VIDEO BRONCHOSCOPY WITH FLUORO;  Surgeon: Collene Gobble, MD;  Location: [place];  Service: Cardiopulmonary;  Laterality: N/A;     Social History:   reports that she has never smoked. She has never used smokeless tobacco. She reports previous alcohol use. She reports that she does not use drugs.   Family History:  Her family history includes Breast cancer in her sister.   Allergies No Known Allergies   Home Medications  Prior to Admission medications   Medication Sig Start Date End Date Taking? Authorizing Provider  Coenzyme Q10 (CO  Q 10 PO) Take 300 mg by mouth daily.   Yes [provider]  dabrafenib mesylate (TAFINLAR) 75 MG capsule Take 2 capsules (150 mg total) by mouth 2 (two) times daily. Take on an empty stomach 1 hour before or 2 hours after meals. 06/02/20  Yes Curt Bears, MD  DANDELION PO Take 1 capsule by mouth daily.   Yes [provider]  ibuprofen (ADVIL) 200 MG tablet Take 400 mg by mouth every 6 (six) hours as needed for mild pain.   Yes [provider]  IVERMECTIN PO Take 1.8 mLs by mouth daily. Ivermectin oil - 10 mg/ml   Yes [provider]  letrozole (FEMARA) 2.5 MG tablet Take 2.5 mg by mouth daily. 12/22/20  Yes [provider]  metFORMIN (GLUCOPHAGE-XR) 750 MG 24 hr tablet Take 750 mg by mouth 2 (two) times daily.   Yes [provider]  Misc Natural  Products (MAGIC MUSHROOM MIX) CAPS Take 1 tablet by mouth daily.   Yes [provider]  Multiple Vitamins-Minerals (MULTIVITAMIN WITH MINERALS) tablet Take 1 tablet by mouth daily.   Yes [provider]  NP THYROID 60 MG tablet Take 60 mg by mouth daily. 02/24/20  Yes [provider]  OVER THE COUNTER MEDICATION Take 2 capsules by mouth 3 (three) times daily. Protectival   Yes [provider]  trametinib dimethyl sulfoxide (MEKINIST) 2 MG tablet Take 1 tablet (2 mg total) by mouth daily. Take 1 hour before or 2 hours after a meal. Store refrigerated in original container. 06/02/20  Yes Curt Bears, MD  Zinc 100 MG TABS Take 100 mg by mouth daily.   Yes [provider]     Critical care time: n/a    Noe Gens, MSN, APRN, NP-C, AGACNP-BC [facility] 01/19/2021, 3:24 PM   Please see [email or web address] for pager details.   From 7A-7P if no response, please call [phone] After hours, please call ELink [phone]

## 2021-01-19 NOTE — Progress Notes (Signed)
Initial Nutrition Assessment  DOCUMENTATION CODES:  Severe malnutrition in context of chronic illness  INTERVENTION:  Add Ensure Plus po TID, each supplement provides 350 kcal and 13 grams of protein   Add Magic cup TID with meals, each supplement provides 290 kcal and 9 grams of protein.  Add MVI with minerals daily.  NUTRITION DIAGNOSIS:  Severe Malnutrition related to chronic illness, cancer and cancer related treatments as evidenced by percent weight loss, moderate fat depletion, severe muscle depletion.  GOAL:  Patient will meet greater than or equal to 90% of their needs  MONITOR:  PO intake, Supplement acceptance, Labs, Weight trends, I & O's  REASON FOR ASSESSMENT:  Malnutrition Screening Tool    ASSESSMENT:  70 yo female with a PMH of metastatic adenocarcinoma of the lung, breast cancer, and T2DM who presents with a pleural effusion on left. 8/9 - thoracentesis (2.4 L of hazy, amber fluid)  Spoke with to pt at bedside. Pt reports that she has been eating rather poorly since November 2021. Her appetite has decreased and is unsure of the specific cause, likely the cancer. She reports that she mostly snacks on fruits.  She reports losing 40 lbs since November. Per Epic, pt has lost ~22 lbs (14.2%) in the last 9 months, which is significant and severe for the time frame.  On exam, pt with moderate to severe depletions.  Recommend adding Ensure Plus (chocolate is pt's preference) and Magic Cup TID, as well as MVI with minerals to promote intake and repletion.  Medications: reviewed; Marinol BID, SSI  Labs: reviewed; Na 133 (L), CBG 95-116  NUTRITION - FOCUSED PHYSICAL EXAM: Flowsheet Row Most Recent Value  Orbital Region Moderate depletion  Upper Arm Region Moderate depletion  Thoracic and Lumbar Region Mild depletion  Buccal Region Severe depletion  Temple Region Severe depletion  Clavicle Bone Region Moderate depletion  Clavicle and Acromion Bone Region Moderate  depletion  Scapular Bone Region Moderate depletion  Dorsal Hand Severe depletion  Patellar Region Severe depletion  Anterior Thigh Region Moderate depletion  Posterior Calf Region Moderate depletion  Edema (RD Assessment) None  Hair Reviewed  Eyes Reviewed  Mouth Reviewed  Skin Reviewed  Nails Reviewed   Diet Order:   Diet Order             Diet Carb Modified Fluid consistency: Thin; Room service appropriate? Yes  Diet effective now                  EDUCATION NEEDS:  Education needs have been addressed  Skin:  Skin Assessment: Reviewed RN Assessment  Last BM:  01/18/21  Height:  Ht Readings from Last 1 Encounters:  01/18/21 5\' 2"  (1.575 m)   Weight:  Wt Readings from Last 1 Encounters:  01/18/21 60.3 kg   BMI:  Body mass index is 24.31 kg/m.  Estimated Nutritional Needs:  Kcal:  2100-2300 Protein:  85-100 grams Fluid:  >2 L  Derrel Nip, RD, LDN (she/her/hers) Registered Dietitian I After-Hours/Weekend Pager # in [place]

## 2021-01-20 ENCOUNTER — Encounter (HOSPITAL_COMMUNITY)
Admission: EM | Disposition: A | Discharge status: Home / Self Care | Payer: Self-pay | Source: Home / Self Care | Attending: Internal Medicine

## 2021-01-20 ENCOUNTER — Inpatient Hospital Stay (HOSPITAL_COMMUNITY): Payer: Medicare Other

## 2021-01-20 ENCOUNTER — Encounter: Payer: Self-pay | Admitting: *Deleted

## 2021-01-20 ENCOUNTER — Inpatient Hospital Stay: Payer: Medicare Other | Admitting: Hematology & Oncology

## 2021-01-20 ENCOUNTER — Encounter (HOSPITAL_COMMUNITY): Payer: Self-pay | Admitting: Internal Medicine

## 2021-01-20 ENCOUNTER — Observation Stay (HOSPITAL_COMMUNITY): Payer: Medicare Other | Admitting: Certified Registered Nurse Anesthetist

## 2021-01-20 ENCOUNTER — Inpatient Hospital Stay: Payer: Medicare Other

## 2021-01-20 DIAGNOSIS — J90 Pleural effusion, not elsewhere classified: Secondary | ICD-10-CM | POA: Diagnosis present

## 2021-01-20 DIAGNOSIS — J9811 Atelectasis: Secondary | ICD-10-CM | POA: Diagnosis not present

## 2021-01-20 DIAGNOSIS — I5189 Other ill-defined heart diseases: Secondary | ICD-10-CM | POA: Diagnosis present

## 2021-01-20 DIAGNOSIS — C3492 Malignant neoplasm of unspecified part of left bronchus or lung: Secondary | ICD-10-CM | POA: Diagnosis not present

## 2021-01-20 DIAGNOSIS — Z79890 Hormone replacement therapy (postmenopausal): Secondary | ICD-10-CM | POA: Diagnosis not present

## 2021-01-20 DIAGNOSIS — I313 Pericardial effusion (noninflammatory): Secondary | ICD-10-CM | POA: Diagnosis not present

## 2021-01-20 DIAGNOSIS — C3432 Malignant neoplasm of lower lobe, left bronchus or lung: Secondary | ICD-10-CM | POA: Diagnosis not present

## 2021-01-20 DIAGNOSIS — J948 Other specified pleural conditions: Secondary | ICD-10-CM | POA: Diagnosis present

## 2021-01-20 DIAGNOSIS — Z9889 Other specified postprocedural states: Secondary | ICD-10-CM

## 2021-01-20 DIAGNOSIS — K5900 Constipation, unspecified: Secondary | ICD-10-CM | POA: Diagnosis not present

## 2021-01-20 DIAGNOSIS — R0602 Shortness of breath: Secondary | ICD-10-CM | POA: Diagnosis present

## 2021-01-20 DIAGNOSIS — C50512 Malignant neoplasm of lower-outer quadrant of left female breast: Secondary | ICD-10-CM

## 2021-01-20 DIAGNOSIS — Z7984 Long term (current) use of oral hypoglycemic drugs: Secondary | ICD-10-CM | POA: Diagnosis not present

## 2021-01-20 DIAGNOSIS — Z170 Estrogen receptor positive status [ER+]: Secondary | ICD-10-CM | POA: Diagnosis not present

## 2021-01-20 DIAGNOSIS — E039 Hypothyroidism, unspecified: Secondary | ICD-10-CM | POA: Diagnosis not present

## 2021-01-20 DIAGNOSIS — Z803 Family history of malignant neoplasm of breast: Secondary | ICD-10-CM | POA: Diagnosis not present

## 2021-01-20 DIAGNOSIS — D630 Anemia in neoplastic disease: Secondary | ICD-10-CM | POA: Diagnosis not present

## 2021-01-20 DIAGNOSIS — Z20822 Contact with and (suspected) exposure to covid-19: Secondary | ICD-10-CM | POA: Diagnosis not present

## 2021-01-20 DIAGNOSIS — Z79899 Other long term (current) drug therapy: Secondary | ICD-10-CM | POA: Diagnosis not present

## 2021-01-20 DIAGNOSIS — C50912 Malignant neoplasm of unspecified site of left female breast: Secondary | ICD-10-CM | POA: Diagnosis not present

## 2021-01-20 DIAGNOSIS — C7951 Secondary malignant neoplasm of bone: Secondary | ICD-10-CM | POA: Diagnosis not present

## 2021-01-20 DIAGNOSIS — J910 Malignant pleural effusion: Secondary | ICD-10-CM | POA: Diagnosis not present

## 2021-01-20 DIAGNOSIS — Z79811 Long term (current) use of aromatase inhibitors: Secondary | ICD-10-CM | POA: Diagnosis not present

## 2021-01-20 DIAGNOSIS — E7800 Pure hypercholesterolemia, unspecified: Secondary | ICD-10-CM | POA: Diagnosis not present

## 2021-01-20 DIAGNOSIS — Z6824 Body mass index (BMI) 24.0-24.9, adult: Secondary | ICD-10-CM | POA: Diagnosis not present

## 2021-01-20 DIAGNOSIS — E43 Unspecified severe protein-calorie malnutrition: Secondary | ICD-10-CM | POA: Insufficient documentation

## 2021-01-20 DIAGNOSIS — E119 Type 2 diabetes mellitus without complications: Secondary | ICD-10-CM | POA: Diagnosis not present

## 2021-01-20 HISTORY — PX: VIDEO BRONCHOSCOPY: SHX5072

## 2021-01-20 HISTORY — PX: BRONCHIAL WASHINGS: SHX5105

## 2021-01-20 HISTORY — PX: BRONCHIAL BRUSHINGS: SHX5108

## 2021-01-20 LAB — BODY FLUID CELL COUNT WITH DIFFERENTIAL
Eos, Fluid: 1 %
Lymphs, Fluid: 0 %
Monocyte-Macrophage-Serous Fluid: 9 % — ABNORMAL LOW (ref 50–90)
Neutrophil Count, Fluid: 90 % — ABNORMAL HIGH (ref 0–25)
Other Cells, Fluid: UNDETERMINED %
Total Nucleated Cell Count, Fluid: 82 cu mm (ref 0–1000)

## 2021-01-20 LAB — GLUCOSE, CAPILLARY
Glucose-Capillary: 108 mg/dL — ABNORMAL HIGH (ref 70–99)
Glucose-Capillary: 113 mg/dL — ABNORMAL HIGH (ref 70–99)
Glucose-Capillary: 162 mg/dL — ABNORMAL HIGH (ref 70–99)
Glucose-Capillary: 121 mg/dL — ABNORMAL HIGH (ref 70–99)

## 2021-01-20 LAB — BASIC METABOLIC PANEL
Anion gap: 11 (ref 5–15)
BUN: 11 mg/dL (ref 8–23)
CO2: 27 mmol/L (ref 22–32)
Calcium: 8.7 mg/dL — ABNORMAL LOW (ref 8.9–10.3)
Chloride: 95 mmol/L — ABNORMAL LOW (ref 98–111)
Creatinine, Ser: 0.49 mg/dL (ref 0.44–1.00)
GFR, Estimated: >60 mL/min (ref >60)
Glucose, Bld: 108 mg/dL — ABNORMAL HIGH (ref 70–99)
Potassium: 3.8 mmol/L (ref 3.5–5.1)
Sodium: 133 mmol/L — ABNORMAL LOW (ref 135–145)

## 2021-01-20 LAB — CBC WITH DIFFERENTIAL/PLATELET
Abs Immature Granulocytes: 0.02 10*3/uL (ref 0.00–0.07)
Basophils Absolute: 0 10*3/uL (ref 0.0–0.1)
Basophils Relative: 0 %
Eosinophils Absolute: 0.2 10*3/uL (ref 0.0–0.5)
Eosinophils Relative: 3 %
HCT: 35.7 % — ABNORMAL LOW (ref 36.0–46.0)
Hemoglobin: 11.3 g/dL — ABNORMAL LOW (ref 12.0–15.0)
Immature Granulocytes: 0 %
Lymphocytes Relative: 33 %
Lymphs Abs: 2.2 10*3/uL (ref 0.7–4.0)
MCH: 27.6 pg (ref 26.0–34.0)
MCHC: 31.7 g/dL (ref 30.0–36.0)
MCV: 87.1 fL (ref 80.0–100.0)
Monocytes Absolute: 0.7 10*3/uL (ref 0.1–1.0)
Monocytes Relative: 10 %
Neutro Abs: 3.6 10*3/uL (ref 1.7–7.7)
Neutrophils Relative %: 54 %
Platelets: 467 10*3/uL — ABNORMAL HIGH (ref 150–400)
RBC: 4.1 MIL/uL (ref 3.87–5.11)
RDW: 15.3 % (ref 11.5–15.5)
WBC: 6.6 10*3/uL (ref 4.0–10.5)
nRBC: 0 % (ref 0.0–0.2)

## 2021-01-20 LAB — MAGNESIUM: Magnesium: 2.1 mg/dL (ref 1.7–2.4)

## 2021-01-20 IMAGING — DX DG CHEST 1V
1 series · 1 of 1 positions shown · non-contrast
Comparison: Radiograph and CT [DATE]

CLINICAL DATA: History of metastatic adenocarcinoma and breast
cancer with shortness of breath following echocardiogram

EXAM:
CHEST  1 VIEW

[chest ap]
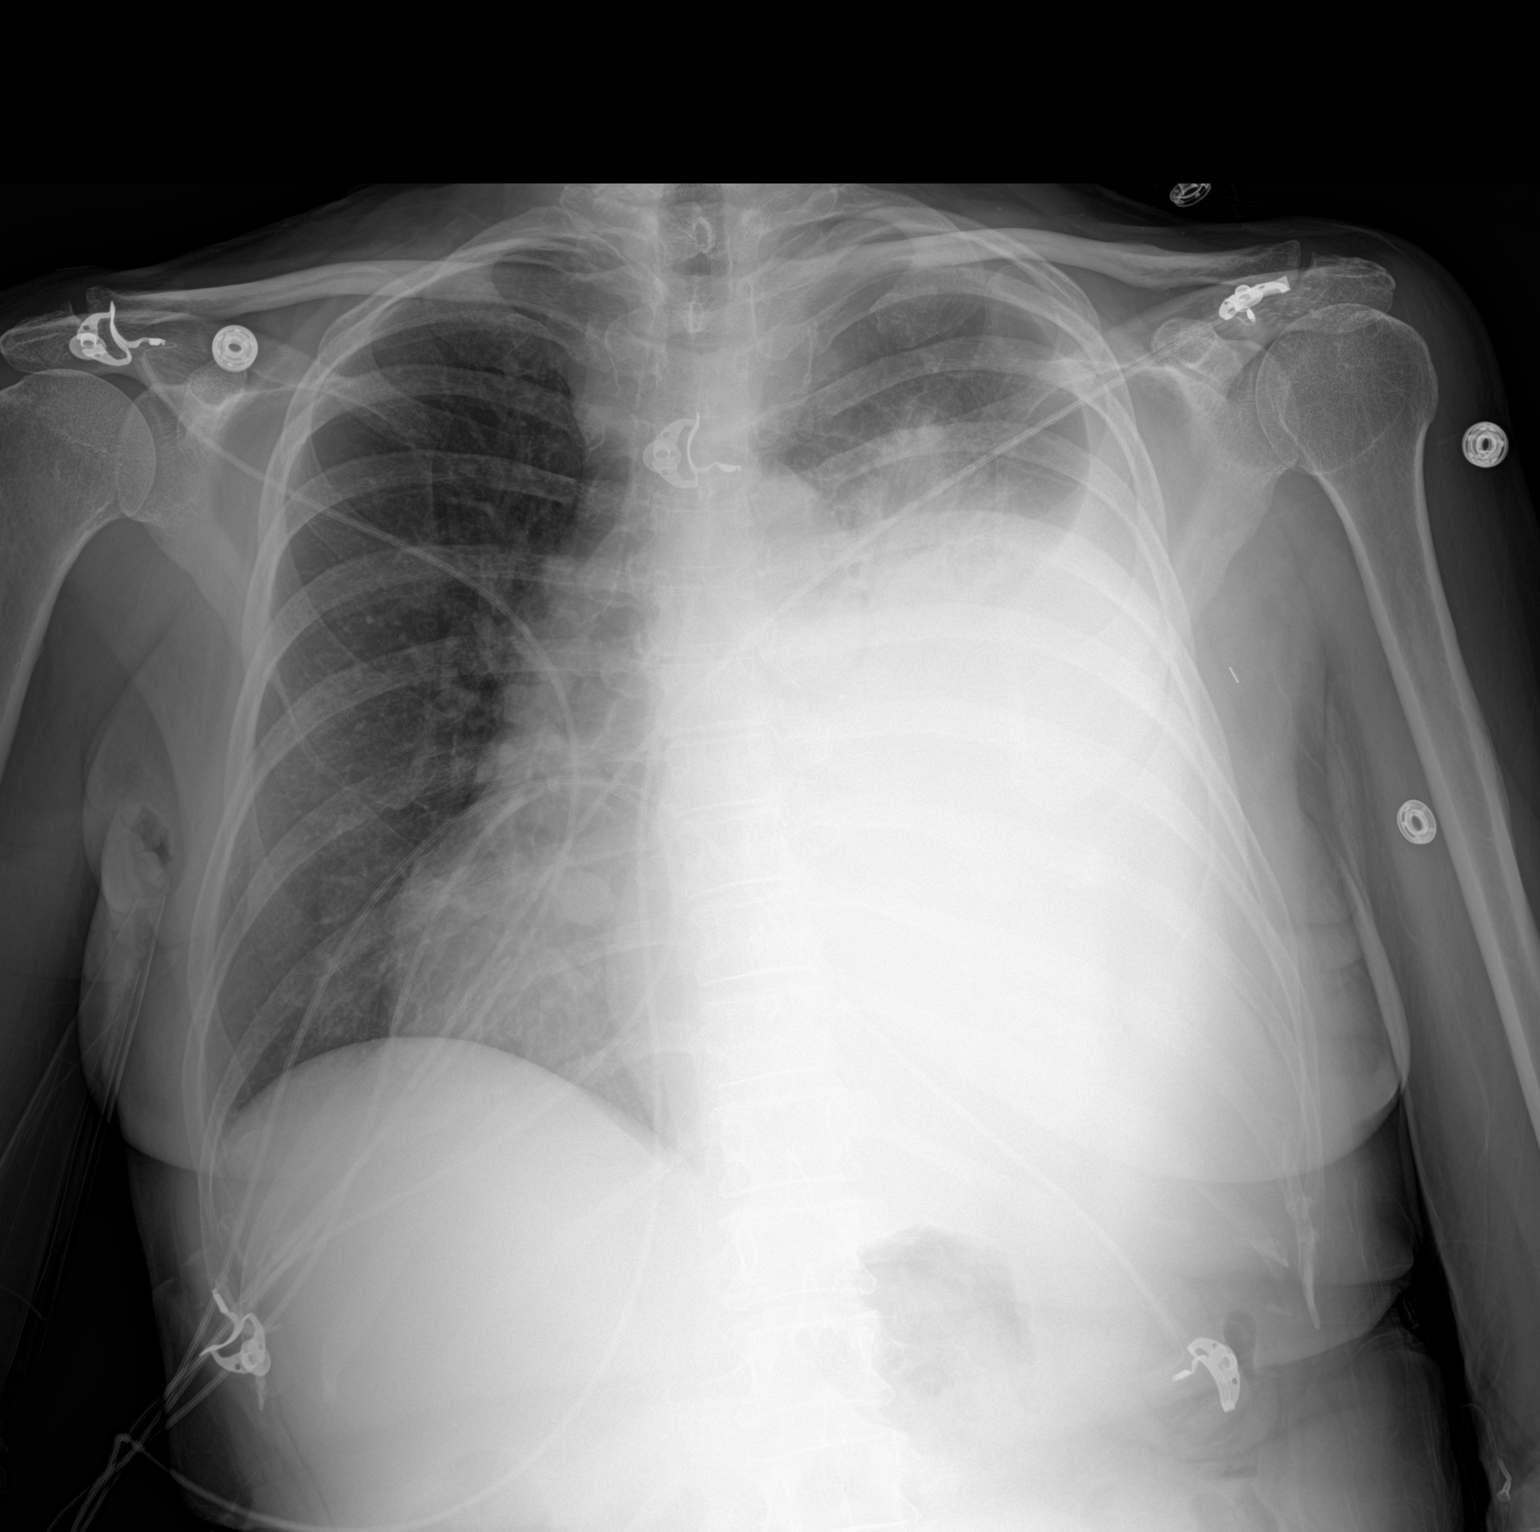

[1 of 1 positions shown; findings below may reference images not displayed]

FINDINGS: Subtotal opacification of the left hemithorax with some minimally
residually aerated lung in the left lung apex within which are areas
of more patchy consolidative opacity quite similar appearance to
comparison CT. Right lung is relatively clear. Cardiomediastinal
contours are partly obscured by overlying opacity though appear
somewhat more prominent than on comparison portable radiography
possibly attributable to a pericardial effusion seen on CT imaging
as well. Telemetry leads overlie the chest. Degenerative changes are
present in the imaged spine and shoulders.
IMPRESSION: Large left effusion passive atelectasis opacified much of the left
hemithorax.

More patchy airspace disease is seen in the residually aerated
portion of the left upper lobe. Possible re-expansion edema or
pneumonia.

Right lung is relatively clear.

Prominent cardiac silhouette partly attributable to a pericardial
effusion seen on CT.

## 2021-01-20 SURGERY — VIDEO BRONCHOSCOPY WITHOUT FLUORO
Anesthesia: General

## 2021-01-20 MED ORDER — SENNOSIDES-DOCUSATE SODIUM 8.6-50 MG PO TABS
2.0000 | ORAL_TABLET | Freq: Two times a day (BID) | ORAL | Status: DC
  Administered 2021-01-20 – 2021-01-21 (×3): 2 via ORAL
  Filled 2021-01-20 (×4): qty 2

## 2021-01-20 MED ORDER — FENTANYL CITRATE (PF) 100 MCG/2ML IJ SOLN: 25.0000 ug | INTRAMUSCULAR | Status: DC | PRN

## 2021-01-20 MED ORDER — BISACODYL 10 MG RE SUPP: 10.0000 mg | Freq: Once | RECTAL | Status: DC

## 2021-01-20 MED ORDER — OXYCODONE HCL 5 MG/5ML PO SOLN: 5.0000 mg | Freq: Once | ORAL | Status: DC | PRN

## 2021-01-20 MED ORDER — BISACODYL 5 MG PO TBEC
10.0000 mg | DELAYED_RELEASE_TABLET | Freq: Every day | ORAL | Status: DC
  Administered 2021-01-20 – 2021-01-21 (×2): 10 mg via ORAL
  Filled 2021-01-20 (×3): qty 2

## 2021-01-20 MED ORDER — EPHEDRINE SULFATE 50 MG/ML IJ SOLN
INTRAMUSCULAR | Status: DC | PRN
  Administered 2021-01-20: 5 mg via INTRAVENOUS

## 2021-01-20 MED ORDER — SUGAMMADEX SODIUM 500 MG/5ML IV SOLN
INTRAVENOUS | Status: DC | PRN
  Administered 2021-01-20: 300 mg via INTRAVENOUS

## 2021-01-20 MED ORDER — PHENYLEPHRINE HCL (PRESSORS) 10 MG/ML IV SOLN
INTRAVENOUS | Status: DC | PRN
  Administered 2021-01-20: 120 ug via INTRAVENOUS
  Administered 2021-01-20: 80 ug via INTRAVENOUS
  Administered 2021-01-20: 120 ug via INTRAVENOUS

## 2021-01-20 MED ORDER — LACTATED RINGERS IV SOLN: INTRAVENOUS | Status: DC | PRN

## 2021-01-20 MED ORDER — LIDOCAINE HCL (CARDIAC) PF 100 MG/5ML IV SOSY
PREFILLED_SYRINGE | INTRAVENOUS | Status: DC | PRN
  Administered 2021-01-20: 60 mg via INTRAVENOUS

## 2021-01-20 MED ORDER — ROCURONIUM BROMIDE 100 MG/10ML IV SOLN
INTRAVENOUS | Status: DC | PRN
Start: 2021-01-20 — End: 2021-01-20
  Administered 2021-01-20: 50 mg via INTRAVENOUS

## 2021-01-20 MED ORDER — OXYCODONE HCL 5 MG PO TABS: 5.0000 mg | ORAL_TABLET | Freq: Once | ORAL | Status: DC | PRN

## 2021-01-20 MED ORDER — ONDANSETRON HCL 4 MG/2ML IJ SOLN
INTRAMUSCULAR | Status: DC | PRN
Start: 2021-01-20 — End: 2021-01-20
  Administered 2021-01-20: 4 mg via INTRAVENOUS

## 2021-01-20 MED ORDER — ONDANSETRON HCL 4 MG/2ML IJ SOLN
4.0000 mg | Freq: Once | INTRAMUSCULAR | Status: AC
  Administered 2021-01-20: 4 mg via INTRAVENOUS
  Filled 2021-01-20: qty 2

## 2021-01-20 MED ORDER — POLYETHYLENE GLYCOL 3350 17 G PO PACK
17.0000 g | PACK | Freq: Every day | ORAL | Status: DC
  Administered 2021-01-20 – 2021-01-21 (×2): 17 g via ORAL
  Filled 2021-01-20 (×3): qty 1

## 2021-01-20 MED ORDER — DEXAMETHASONE SODIUM PHOSPHATE 10 MG/ML IJ SOLN
INTRAMUSCULAR | Status: DC | PRN
  Administered 2021-01-20: 10 mg via INTRAVENOUS

## 2021-01-20 MED ORDER — ONDANSETRON HCL 4 MG/2ML IJ SOLN
4.0000 mg | Freq: Four times a day (QID) | INTRAMUSCULAR | Status: DC | PRN
  Administered 2021-01-20: 4 mg via INTRAVENOUS
  Filled 2021-01-20: qty 2

## 2021-01-20 MED ORDER — PROPOFOL 10 MG/ML IV BOLUS
INTRAVENOUS | Status: DC | PRN
  Administered 2021-01-20: 130 mg via INTRAVENOUS

## 2021-01-20 NOTE — Interval H&P Note (Signed)
History and Physical Interval Note:  01/20/2021 9:43 AM  Tami Stone  has presented today for surgery, with the diagnosis of endobronchial lesion.  The various methods of treatment have been discussed with the patient and family. After consideration of risks, benefits and other options for treatment, the patient has consented to  Procedure(s): VIDEO BRONCHOSCOPY WITHOUT FLUORO (N/A) as a surgical intervention.  The patient's history has been reviewed, patient examined, no change in status, stable for surgery.  I have reviewed the patient's chart and labs.  Questions were answered to the patient's satisfaction.    All questions relating to bronchoscopy addressed  Lesslie Mckeehan A Montee Tallman

## 2021-01-20 NOTE — Transfer of Care (Signed)
Immediate Anesthesia Transfer of Care Note  Patient: Tami Stone  Procedure(s) Performed: VIDEO BRONCHOSCOPY WITHOUT FLUORO BRONCHIAL WASHINGS BRONCHIAL BRUSHINGS  Patient Location: Endoscopy Unit  Anesthesia Type:General  Level of Consciousness: drowsy and patient cooperative  Airway & Oxygen Therapy: Patient Spontanous Breathing and Patient connected to face mask oxygen  Post-op Assessment: Report given to RN and Post -op Vital signs reviewed and stable  Post vital signs: Reviewed and stable  Last Vitals:  Vitals Value Taken Time  BP 160/104   Temp    Pulse 101   Resp 24 01/20/21 1128  SpO2    Vitals shown include unvalidated device data.  Last Pain:  Vitals:   01/20/21 0933  TempSrc: Temporal  PainSc: 0-No pain      Patients Stated Pain Goal: 0 (33/43/56 8616)  Complications: No notable events documented.

## 2021-01-20 NOTE — Op Note (Signed)
[facility] Cardiopulmonary Patient Name: Tami Stone Procedure Date: 01/20/2021 MRN: [id number] Attending MD: Laurin Coder MD, MD Date of Birth: [birth year] CSN: [id number] Age: 70 Admit Type: Inpatient Ethnicity: Not Hispanic or Latino Procedure:             Bronchoscopy Indications:           Lung mass, Atelectasis of the left lower lobe Providers:             Elyn Krogh A. Ander Slade MD, MD, Particia Nearing, RN, Elspeth Cho Tech., Technician Referring MD:           Medicines:             General Anesthesia Complications:         No immediate complications Estimated Blood Loss:  Estimated blood loss was minimal. Procedure:      Pre-Anesthesia Assessment:      - After reviewing the risks and benefits, the patient was deemed in       satisfactory condition to undergo the procedure.      - The anesthesia plan was to use general anesthesia.      After obtaining informed consent, the bronchoscope was passed under       direct vision. Throughout the procedure, the patient's blood pressure,       pulse, and oxygen saturations were monitored continuously. the [id number]       ([id number]) Olympus bronoscope was introduced through the mouth, via the       endotracheal tube (the patient was intubated for the procedure) and       advanced to the tracheobronchial tree of both lungs. The procedure was       accomplished without difficulty. The patient tolerated the procedure       well. Findings:      Mild thickening of the mucosa was found in the anterior medial segment       of the left lower lobe (B7 & B8).      LLL brushing Impression:      - Lung mass      - Atelectasis of the left lower lobe      - No specimens collected.      - Atelectasis of the left lower lobe Moderate Sedation:      An independent trained observer was present and continuously monitored       the patient. Recommendation:      - Await test results. Procedure  Code(s):      --- Professional ---      [phone], Bronchoscopy, rigid or flexible, including fluoroscopic guidance,       when performed; diagnostic, with cell washing, when performed (separate       procedure) Diagnosis Code(s):      --- Professional ---      R91.8, Other nonspecific abnormal finding of lung field      J98.11, Atelectasis CPT copyright 2019 American Medical Association. All rights reserved. The codes documented in this report are preliminary and upon coder review may  be revised to meet current compliance requirements. Sherrilyn Rist, MD Laurin Coder MD, MD 01/20/2021 12:08:04 PM This report has been signed electronically. Number of Addenda: 0 Scope In: Scope Out:

## 2021-01-20 NOTE — Progress Notes (Signed)
Ms. Temples did well with her thoracentesis yesterday.  She had 2.4 L of fluid removed.  Unfortunately, there was very little change in her chest x-ray.  I really would have thought that there would be much more opening of the left lung.  I totally agree with the need for a bronchoscopy.  I wonder if she may have some type of bronchial obstruction as causing the left lung not to  open up.  Apparently, she will go for a bronchoscopy today.  We will have to see what the fluid results show.  I am sure that it is malignant.  She is still somewhat short of breath.  She says she is bringing up a little bit more mucus.  There is no bleeding.  Her labs show white cell count 6.6.  Hemoglobin 11.3.  Platelet count 467,000.  Her BUN is 11 creatinine 0.49.  Calcium 8.7.  Hopefully, we can get her back on the Tafinlar and Mekinist combination.  I really do believe that we need to add immunotherapy to this.  She does have a high PD-L1 level.  She is not complaining of any pain..  She is constipated.  I will order some Senokot S.  Her vital signs look stable.  Temperature 97.7.  Pulse 96.  Blood pressure 122/92.  Oxygen saturation 96% on room air.  There really is no change in her overall physical exam.  We will have to see what the bronchoscopy shows.  If there is a malignant obstruction from tumor, we may have to think about some type of radiation.  I will know if she would be a candidate for any type of endobronchial laser or stent.  I do appreciate everybody's outstanding care for her.  Lattie Haw, MD  Deutronomy 15:10

## 2021-01-20 NOTE — Anesthesia Procedure Notes (Signed)
Procedure Name: Intubation Date/Time: 01/20/2021 10:52 AM Performed by: Lyndal Pulley, CRNA Pre-anesthesia Checklist: Patient identified, Emergency Drugs available, Suction available and Patient being monitored Patient Re-evaluated:Patient Re-evaluated prior to induction Oxygen Delivery Method: Circle system utilized Preoxygenation: Pre-oxygenation with 100% oxygen Induction Type: IV induction Ventilation: Mask ventilation without difficulty Laryngoscope Size: Mac and 3 Grade View: Grade I Tube type: Oral Number of attempts: 1 Airway Equipment and Method: Stylet and Oral airway Placement Confirmation: ETT inserted through vocal cords under direct vision, positive ETCO2 and breath sounds checked- equal and bilateral Secured at: 21 cm Tube secured with: Tape Dental Injury: Teeth and Oropharynx as per pre-operative assessment

## 2021-01-20 NOTE — Progress Notes (Signed)
Patient was to be seen today for follow up. She unfortunately is currently hospitalized. Will follow up early next week for outpatient follow up needs.  Oncology Nurse Navigator Documentation  Oncology Nurse Navigator Flowsheets 01/20/2021  Abnormal Finding Date -  Confirmed Diagnosis Date -  Diagnosis Status -  Navigator Follow Up Date: 01/25/2021  Navigator Follow Up Reason: Appointment Review  Navigation Complete Date: -  Post Navigation: Continue to Follow Patient? -  Reason Not Navigating Patient: -  Navigator Location [facility]  Referral Date to RadOnc/MedOnc -  Navigator Encounter Type Appt/Treatment Plan Review  Telephone -  [facility] Date -  Multidisiplinary Clinic Type -  Patient Visit Type MedOnc  Treatment Phase Active Tx  Barriers/Navigation Needs Coordination of Care;Education  Education -  Interventions Other  Acuity Level 2-Minimal Needs (1-2 Barriers Identified)  Coordination of Care -  Education Method -  Support Groups/Services Friends and Family  Time Spent with Patient 15

## 2021-01-20 NOTE — Anesthesia Preprocedure Evaluation (Addendum)
Anesthesia Evaluation  Patient identified by MRN, date of birth, ID band Patient awake    Reviewed: Allergy & Precautions, NPO status , Patient's Chart, lab work & pertinent test results  Airway Mallampati: II  TM Distance: >3 FB Neck ROM: Full    Dental no notable dental hx. (+) Dental Advisory Given, Upper Dentures   Pulmonary Patient abstained from smoking.,  Lung cancer   Pulmonary exam normal breath sounds clear to auscultation       Cardiovascular Exercise Tolerance: Good negative cardio ROS Normal cardiovascular exam Rhythm:Regular Rate:Normal     Neuro/Psych negative neurological ROS  negative psych ROS   GI/Hepatic negative GI ROS, Neg liver ROS,   Endo/Other  diabetes, Oral Hypoglycemic Agentshypercholesterolemia  Renal/GU negative Renal ROS     Musculoskeletal negative musculoskeletal ROS (+)   Abdominal   Peds negative pediatric ROS (+)  Hematology  (+) anemia , hgb 13.7   Anesthesia Other Findings H/o breast cancer  Reproductive/Obstetrics negative OB ROS                            Anesthesia Physical  Anesthesia Plan  ASA: 2  Anesthesia Plan: General   Post-op Pain Management:    Induction: Intravenous  PONV Risk Score and Plan: 3 and Treatment may vary due to age or medical condition, Ondansetron and Midazolam  Airway Management Planned: Oral ETT  Additional Equipment:   Intra-op Plan:   Post-operative Plan: Extubation in OR  Informed Consent: I have reviewed the patients History and Physical, chart, labs and discussed the procedure including the risks, benefits and alternatives for the proposed anesthesia with the patient or authorized representative who has indicated his/her understanding and acceptance.     Dental advisory given  Plan Discussed with: CRNA and Anesthesiologist  Anesthesia Plan Comments: (O2 sat Upper 80s to low 90s on room air. Will  place patient on 2L [place] and will continue that postoperatively until improvement in oxygenation status. GETA for procedure. Norton Blizzard, MD  )       Anesthesia Quick Evaluation

## 2021-01-20 NOTE — Progress Notes (Signed)
PROGRESS NOTE    Talyah Seder  QMG:[id number] DOB: 1951/04/13 DOA: 01/18/2021 PCP: Gaynelle Cage, MD   Brief Narrative: 70 y.o. female with history of metastatic adenocarcinoma of the lung and also breast cancer has had 2D echo done following which patient was getting short of breath.  Patient has been having progressively worsening shortness of breath over the last few weeks.  Has known history of left pleural effusion.    the ER patient was mildly tachypneic and tachycardic.  Afebrile.  COVID test negative.  Chest x-ray shows complete opacification of the left side. She was admitted for thoracentesis and further work-up.  Assessment & Plan:   Principal Problem:   Pleural effusion on left Active Problems:   Adenocarcinoma of left lung, stage 4 (HCC)   Protein-calorie malnutrition, severe   Pleural effusion  #1  Left pleural effusion patient with history of adenocarcinoma status post 2.4 L of fluid taken off on 01/19/2021 chest x-ray before and after with no significant improvement.  Status post bronc 01/20/2021 with no endobronchial lesion noted however extrinsic compression was noted. Chest x-ray 01/20/2021 pending. Patient appears in no acute distress on room air. Dr. Marin Olp following  #2 moderate pericardial effusion with no signs of tamponade monitor closely.  #3 type 2 diabetes on SSI continue.CBG (last 3)  Recent Labs    01/19/21 1949 01/20/21 0729 01/20/21 1152  GLUCAP 117* 108* 113*      #4 hypothyroidism on Synthroid  #5 constipation on senna and Dulcolax    Nutrition Problem: Severe Malnutrition Etiology: chronic illness, cancer and cancer related treatments     Signs/Symptoms: percent weight loss, moderate fat depletion, severe muscle depletion Percent weight loss: 14.2 %    Interventions: Ensure Enlive (each supplement provides 350kcal and 20 grams of protein), Magic cup, MVI  Estimated body mass index is 24.31 kg/m as calculated from the  following:   Height as of this encounter: 5\' 2"  (1.575 m).   Weight as of this encounter: 60.3 kg.  DVT prophylaxis: SCD  code Status: Full code  family Communication: None at bedside Disposition Plan:  Status is: Inpatient  Remains inpatient appropriate because:Ongoing diagnostic testing needed not appropriate for outpatient work up  Dispo: The patient is from: Home              Anticipated d/c is to: Home              Patient currently is not medically stable to d/c.   Difficult to place patient No       Consultants:  Pulmonary interventional radiology and oncology  Procedures: Bronc 01/20/2021  Thoracentesis 01/19/2021 antimicrobials: None  Subjective: She is resting in bed complaining of constipation has dyspnea on exertion  Objective: Vitals:   01/20/21 1140 01/20/21 1150 01/20/21 1200 01/20/21 1223  BP: (!) 158/97 (!) 152/98 (!) 140/96 (!) 136/93  Pulse: (!) 104 (!) 104 (!) 106 (!) 102  Resp: (!) 25 (!) 26 (!) 34 20  Temp:    97.9 F (36.6 C)  TempSrc:    Oral  SpO2: 99% 97% 96% 98%  Weight:      Height:        Intake/Output Summary (Last 24 hours) at 01/20/2021 1627 Last data filed at 01/20/2021 1108 Gross per 24 hour  Intake 640 ml  Output 5 ml  Net 635 ml   Filed Weights   01/18/21 1333  Weight: 60.3 kg    Examination:  General exam: Appears calm and comfortable  Respiratory  system: Diminished breath sounds on the left compared to the right to auscultation. Respiratory effort normal. Cardiovascular system: S1 & S2 heard, RRR. No JVD, murmurs, rubs, gallops or clicks. No pedal edema. Gastrointestinal system: Abdomen is nondistended, soft and nontender. No organomegaly or masses felt. Normal bowel sounds heard. Central nervous system: Alert and oriented. No focal neurological deficits. Extremities: Symmetric 5 x 5 power. Skin: No rashes, lesions or ulcers Psychiatry: Judgement and insight appear normal. Mood & affect appropriate.     Data  Reviewed: I have personally reviewed following labs and imaging studies  CBC: Recent Labs  Lab 01/18/21 1400 01/19/21 0406 01/20/21 0412  WBC 6.1 6.0 6.6  NEUTROABS 3.9  --  3.6  HGB 10.9* 9.4* 11.3*  HCT 34.0* 30.1* 35.7*  MCV 86.3 88.8 87.1  PLT 531* 420* 149*   Basic Metabolic Panel: Recent Labs  Lab 01/18/21 1400 01/19/21 0406 01/20/21 0412  NA 134* 133* 133*  K 3.7 3.9 3.8  CL 95* 96* 95*  CO2 28 27 27   GLUCOSE 123* 107* 108*  BUN 13 13 11   CREATININE 0.60 0.57 0.49  CALCIUM 9.1 9.1 8.7*  MG  --   --  2.1   GFR: Estimated Creatinine Clearance: 55.2 mL/min (by C-G formula based on SCr of 0.49 mg/dL). Liver Function Tests: Recent Labs  Lab 01/18/21 1400  AST 17  ALT 14  ALKPHOS 105  BILITOT 0.3  PROT 7.3  ALBUMIN 3.3*   No results for input(s): LIPASE, AMYLASE in the last 168 hours. No results for input(s): AMMONIA in the last 168 hours. Coagulation Profile: No results for input(s): INR, PROTIME in the last 168 hours. Cardiac Enzymes: No results for input(s): CKTOTAL, CKMB, CKMBINDEX, TROPONINI in the last 168 hours. BNP (last 3 results) No results for input(s): PROBNP in the last 8760 hours. HbA1C: No results for input(s): HGBA1C in the last 72 hours. CBG: Recent Labs  Lab 01/19/21 1141 01/19/21 1657 01/19/21 1949 01/20/21 0729 01/20/21 1152  GLUCAP 108* 119* 117* 108* 113*   Lipid Profile: No results for input(s): CHOL, HDL, LDLCALC, TRIG, CHOLHDL, LDLDIRECT in the last 72 hours. Thyroid Function Tests: Recent Labs    01/19/21 0723  TSH 12.988*   Anemia Panel: No results for input(s): VITAMINB12, FOLATE, FERRITIN, TIBC, IRON, RETICCTPCT in the last 72 hours. Sepsis Labs: No results for input(s): PROCALCITON, LATICACIDVEN in the last 168 hours.  Recent Results (from the past 240 hour(s))  Resp Panel by RT-PCR (Flu A&B, Covid) Nasopharyngeal Swab     Status: None   Collection Time: 01/18/21  2:10 PM   Specimen: Nasopharyngeal Swab;  Nasopharyngeal(NP) swabs in vial transport medium  Result Value Ref Range Status   SARS Coronavirus 2 by RT PCR NEGATIVE NEGATIVE Final    Comment: (NOTE) SARS-CoV-2 target nucleic acids are NOT DETECTED.  The SARS-CoV-2 RNA is generally detectable in upper respiratory specimens during the acute phase of infection. The lowest concentration of SARS-CoV-2 viral copies this assay can detect is 138 copies/mL. A negative result does not preclude SARS-Cov-2 infection and should not be used as the sole basis for treatment or other patient management decisions. A negative result may occur with  improper specimen collection/handling, submission of specimen other than nasopharyngeal swab, presence of viral mutation(s) within the areas targeted by this assay, and inadequate number of viral copies(<138 copies/mL). A negative result must be combined with clinical observations, patient history, and epidemiological information. The expected result is Negative.  Fact Sheet for Patients:  EntrepreneurPulse.com.au  Fact Sheet for Healthcare Providers:  IncredibleEmployment.be  This test is no t yet approved or cleared by the [place] FDA and  has been authorized for detection and/or diagnosis of SARS-CoV-2 by FDA under an Emergency Use Authorization (EUA). This EUA will remain  in effect (meaning this test can be used) for the duration of the COVID-19 declaration under Section 564(b)(1) of the Act, 21 U.S.C.section 360bbb-3(b)(1), unless the authorization is terminated  or revoked sooner.       Influenza A by PCR NEGATIVE NEGATIVE Final   Influenza B by PCR NEGATIVE NEGATIVE Final    Comment: (NOTE) The Xpert Xpress SARS-CoV-2/FLU/RSV plus assay is intended as an aid in the diagnosis of influenza from Nasopharyngeal swab specimens and should not be used as a sole basis for treatment. Nasal washings and aspirates are unacceptable for Xpert Xpress  SARS-CoV-2/FLU/RSV testing.  Fact Sheet for Patients: EntrepreneurPulse.com.au  Fact Sheet for Healthcare Providers: IncredibleEmployment.be  This test is not yet approved or cleared by the [place] FDA and has been authorized for detection and/or diagnosis of SARS-CoV-2 by FDA under an Emergency Use Authorization (EUA). This EUA will remain in effect (meaning this test can be used) for the duration of the COVID-19 declaration under Section 564(b)(1) of the Act, 21 U.S.C. section 360bbb-3(b)(1), unless the authorization is terminated or revoked.  Performed at [facility], [address], [place],  [address]          Radiology Studies: DG Chest 1 View  Result Date: 01/19/2021 CLINICAL DATA:  Status post thoracentesis.  2.4 L removed. EXAM: CHEST  1 VIEW COMPARISON:  Chest radiograph 01/18/2021 FINDINGS: Post LEFT thoracentesis with return of the mediastinal structures to midline following fluid removal. A small amount of aerated lung in the LEFT upper lobe is not demonstrated although there is continued near complete opacification of the LEFT hemithorax. No pneumothorax evident. RIGHT lung is clear without edema or pneumothorax. IMPRESSION: 1. Return of mediastinal structures to midline following thoracentesis. 2. A small amount of aerated lung is now evident in LEFT upper lobe following thoracentesis. LEFT hemithorax remains near completely opacified. 3. No pneumothorax. Electronically Signed   By: Suzy Bouchard M.D.   On: 01/19/2021 10:38   CT CHEST W CONTRAST  Result Date: 01/19/2021 CLINICAL DATA:  Abnormal chest x-ray. EXAM: CT CHEST WITH CONTRAST TECHNIQUE: Multidetector CT imaging of the chest was performed during intravenous contrast administration. CONTRAST:  30mL OMNIPAQUE IOHEXOL 350 MG/ML SOLN COMPARISON:  Chest CT 04/16/2020.  Chest x-ray today. FINDINGS: Cardiovascular: Heart is normal size. Small pericardial  effusion. Aorta is normal caliber. Mediastinum/Nodes: No mediastinal, hilar, or axillary adenopathy. Trachea and esophagus are unremarkable. Thyroid unremarkable. Lungs/Pleura: Large left pleural effusion. Only the left upper lobe is aerated with extensive airspace disease throughout the left upper lobe. Left lower lobe is collapsed which could be related to compressive atelectasis. No confluent opacity or effusion on the right. Linear atelectasis or scarring in the right middle lobe. Upper Abdomen: Imaging into the upper abdomen demonstrates no acute findings. Musculoskeletal: Chest wall soft tissues are unremarkable. There is a new sclerotic lesion within the anterior L1 vertebral body. No additional focal bone lesion or acute bony abnormality. IMPRESSION: Large left pleural effusion. Complete opacification of the left lower lobe with some aeration of the left upper lobe. Extensive airspace disease throughout the left upper lobe. This could reflect pneumonia or re-expansion edema following prior thoracentesis. No pneumothorax. Small pericardial effusion. New sclerotic lesion  anteriorly within the L1 vertebral body. Cannot exclude sclerotic metastasis. Consider further evaluation with nuclear medicine bone scan or MRI. Electronically Signed   By: Rolm Baptise M.D.   On: 01/19/2021 18:38   US THORACENTESIS ASP PLEURAL SPACE W/IMG GUIDE  Result Date: 01/19/2021 INDICATION: Patient with history of metastatic lung cancer, dyspnea, left pleural effusion. Request received for diagnostic and therapeutic left thoracentesis. EXAM: ULTRASOUND GUIDED DIAGNOSTIC AND THERAPEUTIC LEFT THORACENTESIS MEDICATIONS: 1% lidocaine to skin and subcutaneous tissue COMPLICATIONS: None immediate. PROCEDURE: An ultrasound guided thoracentesis was thoroughly discussed with the patient and questions answered. The benefits, risks, alternatives and complications were also discussed. The patient understands and wishes to proceed with the  procedure. Written consent was obtained. Ultrasound was performed to localize and mark an adequate pocket of fluid in the left chest. The area was then prepped and draped in the normal sterile fashion. 1% Lidocaine was used for local anesthesia. Under ultrasound guidance a 6 Fr Safe-T-Centesis catheter was introduced. Thoracentesis was performed. The catheter was removed and a dressing applied. FINDINGS: A total of approximately 2.4 liters of hazy,amber fluid was removed. Samples were sent to the laboratory as requested by the clinical team. Due to pt coughing /chest discomfort only the above amount of fluid was removed today. IMPRESSION: Successful ultrasound guided diagnostic and therapeutic left thoracentesis yielding 2.4 liters of pleural fluid. Read by: Rowe Robert, PA-C Electronically Signed   By: Jerilynn Mages.  Shick M.D.   On: 01/19/2021 10:21        Scheduled Meds:  bisacodyl  10 mg Oral Daily   bisacodyl  10 mg Rectal Once   dabrafenib mesylate  150 mg Oral BID   dronabinol  5 mg Oral BID AC   feeding supplement  237 mL Oral TID BM   insulin aspart  0-9 Units Subcutaneous TID WC   letrozole  2.5 mg Oral Daily   multivitamin with minerals  1 tablet Oral Daily   polyethylene glycol  17 g Oral Daily   senna-docusate  2 tablet Oral BID   thyroid  60 mg Oral Daily   trametinib dimethyl sulfoxide  2 mg Oral Daily   Continuous Infusions:   LOS: 0 days    Time spent: 45 min    Georgette Shell, MD 01/20/2021, 4:27 PM

## 2021-01-20 NOTE — Progress Notes (Signed)
Tolerated bronchoscopy well  No mass lesion noted to be causing obstruction to the airway  There was extrinsic compression  Mucosal abnormality noted  Brushings, washings left lower lobe was sent for analysis  Did well with procedure  Extubated and stable     From a pulmonary perspective -Discharge planning may be put in place  Patient will require thoracentesis -This may be scheduled as an outpatient procedure -She still has significant amount of fluid  Follow BAL cytology Follow pleural fluid cytology

## 2021-01-20 NOTE — Op Note (Addendum)
Bronchoscopy Procedure Note  Date of Operation: 01/20/2021  Pre-op Diagnosis: lung mass, atelectasis  Post-op Diagnosis: Atelectasis  Surgeon: Laurin Coder  Assistants:    Anesthesia: General endotracheal anesthesia  Operation: Flexible fiberoptic bronchoscopy, diagnostic bronchoalveolar lavage left lower lobe, brushings left lower lobe  Findings: No mass lesion causing airway obstruction.  Mucosal abnormalities left lower lobe.  Airway was not fully open secondary to extrinsic compression  Specimen: Bronchoalveolar lavage left lower lobe, brushing left lower lobe  Estimated Blood Loss: Minimal  Drains: none  Complications: none  Indications and History: The patient is a 70 y.o. female with history of lung cancer bronchoscopy suite.  The risks, benefits, complications, treatment options and expected outcomes were discussed with the patient.  The possibilities of reaction to medication, pulmonary aspiration, perforation of a viscus, bleeding, failure to diagnose a condition and creating a complication requiring transfusion or operation were discussed with the patient who freely signed the consent.    Description of Procedure: The patient was seen in the Holding Room and the site of surgery properly noted/marked.  The patient was taken to bronchoscopy suite, identified as Tami Stone and the procedure verified as Flexible Fiberoptic Bronchoscopy.  A Time Out was held and the above information confirmed.   After the induction of general anesthesia, the bronchoscope was passed through the endotracheal tube.  The scope was then passed into the trachea.  Careful inspection of the tracheal lumen was accomplished.  Extrinsic narrowing of left lower lobe noted There was no large mass lesion occluding the airway.  Scope could not be advanced into the left lower lobe because of extrinsic narrowing Right airway was examined-right upper lobe, right middle lobe right lower lobe  bronchi were all visualized with no lesions   Right main bronchus: Normal mucosa Right upper lobe bronchus: Normal mucosa Right upper lobe bronchus: Normal mucosa Right upper lobe bronchus: Normal mucosa Left main bronchus: Normal mucosa Left upper lobe bronchus: Normal mucosa Left lower lobe bronchus: Extrinsic narrowing, mucosal elevation noted  Attempted needle aspiration mucosal elevation-no samples retrieved Samples taken-lavage left lower lobe, brushings left lower lobe  The Patient was taken to the Endoscopy Recovery area in satisfactory condition.  Attestation: I performed the procedure.  Brealyn Baril A Murtaza Shell

## 2021-01-20 NOTE — Anesthesia Postprocedure Evaluation (Signed)
Anesthesia Post Note  Patient: Tami Stone  Procedure(s) Performed: VIDEO BRONCHOSCOPY WITHOUT FLUORO BRONCHIAL WASHINGS BRONCHIAL BRUSHINGS     Patient location during evaluation: PACU Anesthesia Type: General Level of consciousness: awake Pain management: pain level controlled Vital Signs Assessment: post-procedure vital signs reviewed and stable Respiratory status: spontaneous breathing and respiratory function stable Cardiovascular status: stable Postop Assessment: no apparent nausea or vomiting Anesthetic complications: no   No notable events documented.  Last Vitals:  Vitals:   01/20/21 1200 01/20/21 1223  BP: (!) 140/96 (!) 136/93  Pulse: (!) 106 (!) 102  Resp: (!) 34 20  Temp:  36.6 C  SpO2: 96% 98%    Last Pain:  Vitals:   01/20/21 1223  TempSrc: Oral  PainSc:                  [name]

## 2021-01-21 ENCOUNTER — Inpatient Hospital Stay (HOSPITAL_COMMUNITY): Payer: Medicare Other

## 2021-01-21 ENCOUNTER — Encounter (HOSPITAL_COMMUNITY): Payer: Self-pay | Admitting: Pulmonary Disease

## 2021-01-21 DIAGNOSIS — C50512 Malignant neoplasm of lower-outer quadrant of left female breast: Secondary | ICD-10-CM | POA: Diagnosis not present

## 2021-01-21 DIAGNOSIS — Z170 Estrogen receptor positive status [ER+]: Secondary | ICD-10-CM | POA: Diagnosis not present

## 2021-01-21 DIAGNOSIS — C3492 Malignant neoplasm of unspecified part of left bronchus or lung: Secondary | ICD-10-CM | POA: Diagnosis not present

## 2021-01-21 DIAGNOSIS — J90 Pleural effusion, not elsewhere classified: Secondary | ICD-10-CM | POA: Diagnosis not present

## 2021-01-21 LAB — CBC WITH DIFFERENTIAL/PLATELET
Abs Immature Granulocytes: 0.02 10*3/uL (ref 0.00–0.07)
Basophils Absolute: 0 10*3/uL (ref 0.0–0.1)
Basophils Relative: 0 %
Eosinophils Absolute: 0.2 10*3/uL (ref 0.0–0.5)
Eosinophils Relative: 3 %
HCT: 33.1 % — ABNORMAL LOW (ref 36.0–46.0)
Hemoglobin: 10.5 g/dL — ABNORMAL LOW (ref 12.0–15.0)
Immature Granulocytes: 0 %
Lymphocytes Relative: 33 %
Lymphs Abs: 2 10*3/uL (ref 0.7–4.0)
MCH: 27.6 pg (ref 26.0–34.0)
MCHC: 31.7 g/dL (ref 30.0–36.0)
MCV: 86.9 fL (ref 80.0–100.0)
Monocytes Absolute: 0.6 10*3/uL (ref 0.1–1.0)
Monocytes Relative: 10 %
Neutro Abs: 3.3 10*3/uL (ref 1.7–7.7)
Neutrophils Relative %: 54 %
Platelets: 429 10*3/uL — ABNORMAL HIGH (ref 150–400)
RBC: 3.81 MIL/uL — ABNORMAL LOW (ref 3.87–5.11)
RDW: 15.3 % (ref 11.5–15.5)
WBC: 6 10*3/uL (ref 4.0–10.5)
nRBC: 0 % (ref 0.0–0.2)

## 2021-01-21 LAB — GLUCOSE, CAPILLARY
Glucose-Capillary: 96 mg/dL (ref 70–99)
Glucose-Capillary: 113 mg/dL — ABNORMAL HIGH (ref 70–99)
Glucose-Capillary: 98 mg/dL (ref 70–99)
Glucose-Capillary: 110 mg/dL — ABNORMAL HIGH (ref 70–99)

## 2021-01-21 LAB — BASIC METABOLIC PANEL
Anion gap: 8 (ref 5–15)
BUN: 14 mg/dL (ref 8–23)
CO2: 28 mmol/L (ref 22–32)
Calcium: 8.6 mg/dL — ABNORMAL LOW (ref 8.9–10.3)
Chloride: 95 mmol/L — ABNORMAL LOW (ref 98–111)
Creatinine, Ser: 0.52 mg/dL (ref 0.44–1.00)
GFR, Estimated: >60 mL/min (ref >60)
Glucose, Bld: 91 mg/dL (ref 70–99)
Potassium: 3.7 mmol/L (ref 3.5–5.1)
Sodium: 131 mmol/L — ABNORMAL LOW (ref 135–145)

## 2021-01-21 LAB — MAGNESIUM: Magnesium: 2.1 mg/dL (ref 1.7–2.4)

## 2021-01-21 LAB — CYTOLOGY - NON PAP

## 2021-01-21 IMAGING — DX DG CHEST 1V
1 series · 1 of 1 positions shown · non-contrast
Comparison: Chest radiograph 1 day prior

CLINICAL DATA: Post thoracentesis

EXAM:
CHEST  1 VIEW

[chest ap]
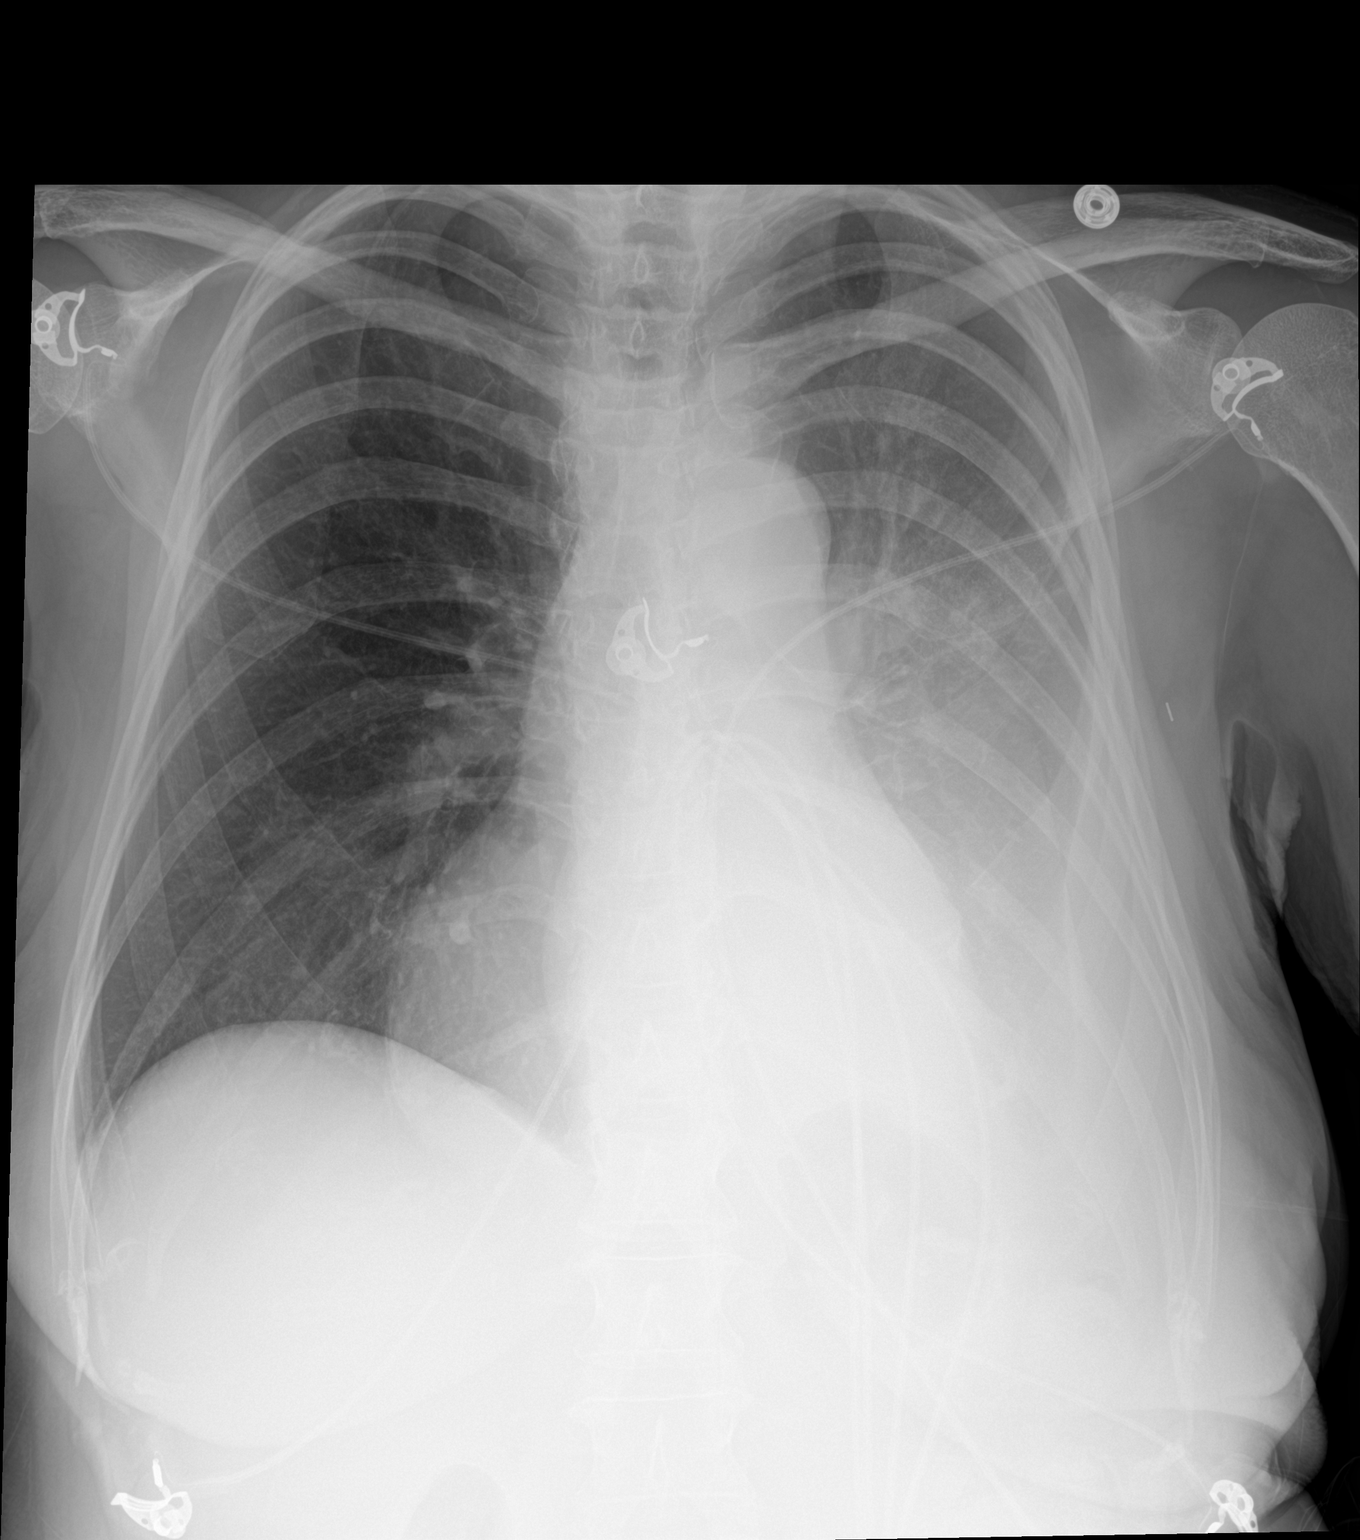

[1 of 1 positions shown; findings below may reference images not displayed]

FINDINGS: The cardiomediastinal silhouette is within normal limits.

The left pleural effusion has decreased in size following
thoracentesis with improved aeration of the upper lung. No
pneumothorax is seen; a vertically line projecting over the mid left
hemithorax likely reflects skinfold artifact. The right lung is
clear. There is no right pleural effusion or pneumothorax

The bones are stable.
IMPRESSION: Decreased left pleural effusion with improved aeration of the left
lung following thoracentesis. No pneumothorax identified.

## 2021-01-21 IMAGING — US US THORACENTESIS ASP PLEURAL SPACE W/IMG GUIDE
1 series · 2 of 2 positions shown · non-contrast
Comparison: none

INDICATION: Patient with history of metastatic lung cancer, dyspnea, recurrent
left pleural effusion. Request received for therapeutic left
thoracentesis.

[Series 1: us thoracentesis asp pleural s mc & wl · 2 of 2 slices shown]
[im 1/2]
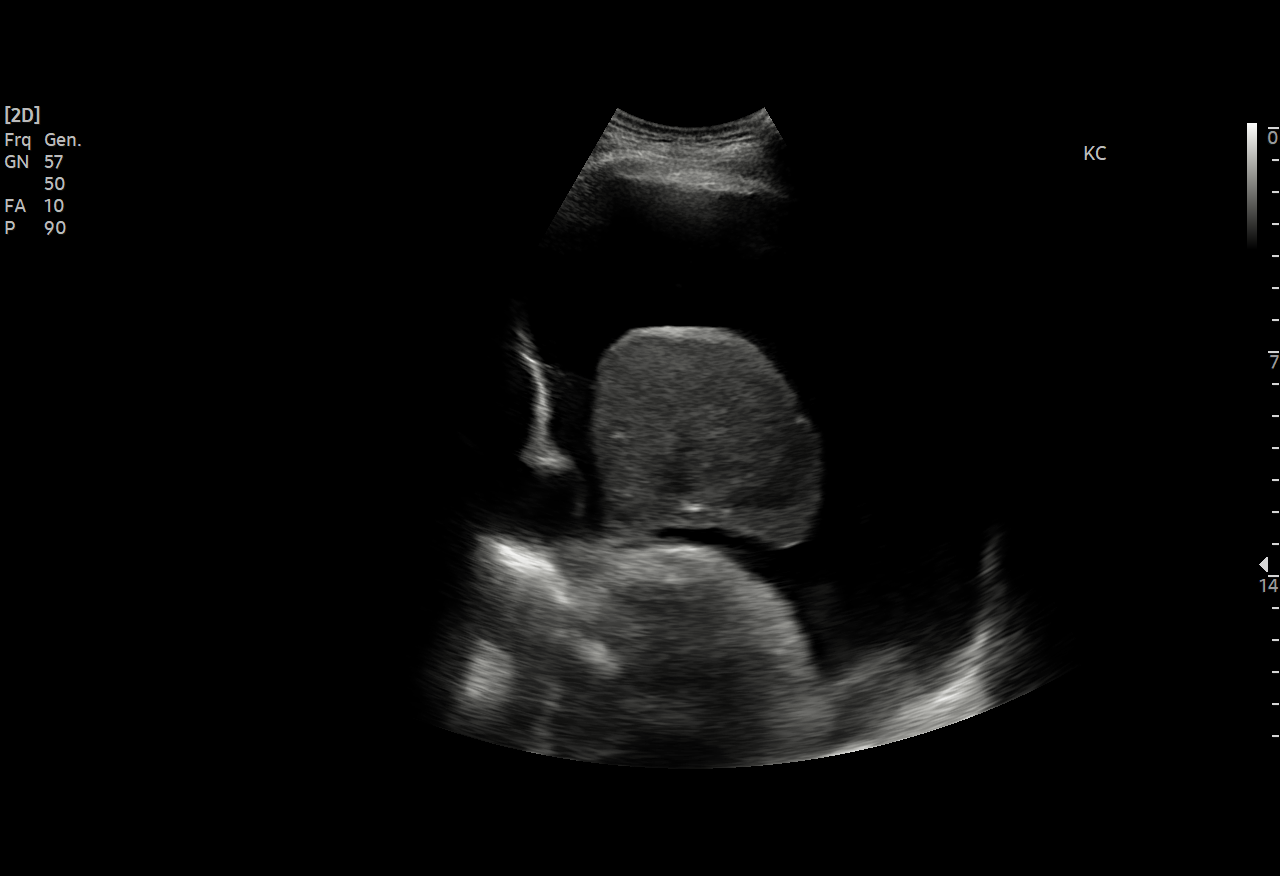
[im 2/2]
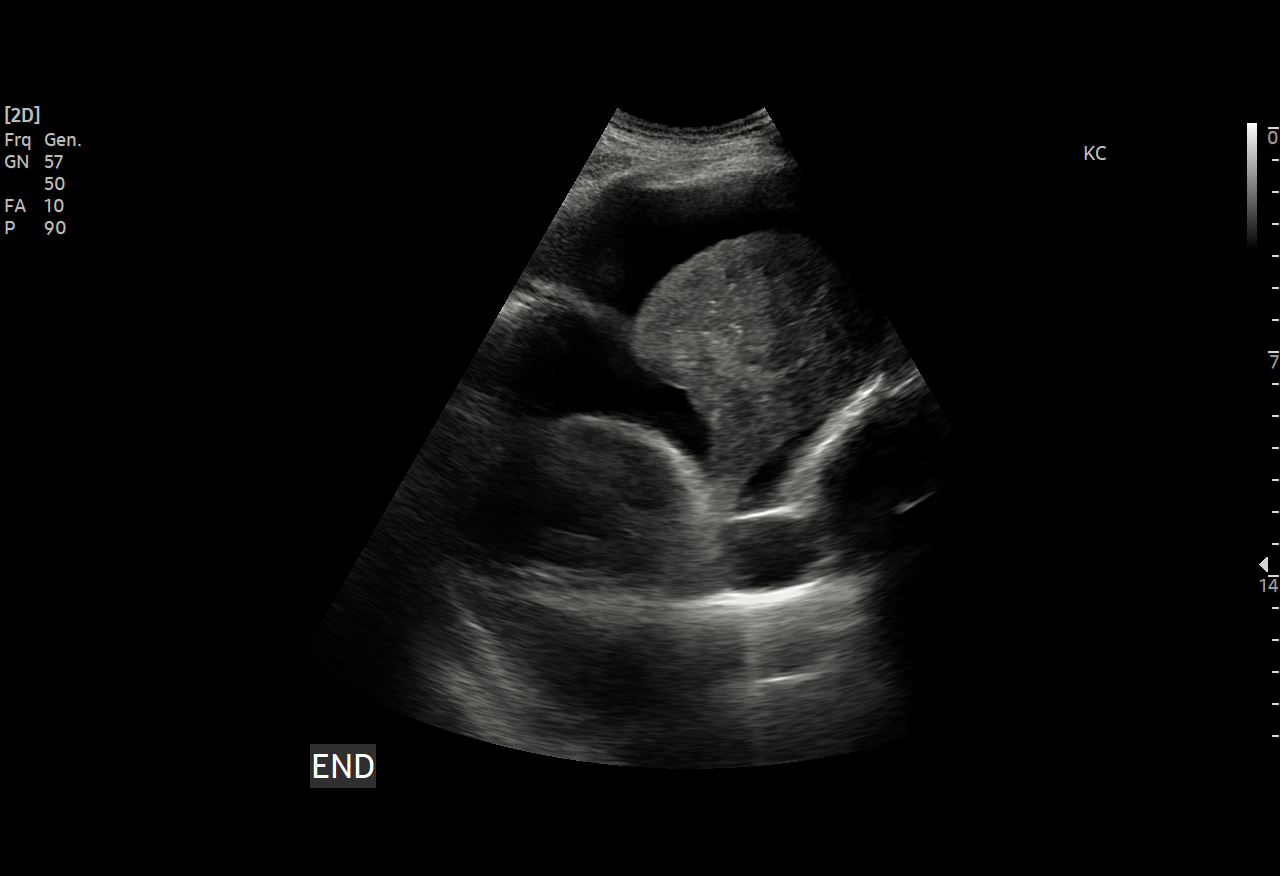

[2 of 2 positions shown; findings below may reference images not displayed]

EXAM:
ULTRASOUND GUIDED THERAPEUTIC LEFT THORACENTESIS

MEDICATIONS:
1% lidocaine to skin and subcutaneous tissue

COMPLICATIONS:
None immediate.

PROCEDURE:
An ultrasound guided thoracentesis was thoroughly discussed with the
patient and questions answered. The benefits, risks, alternatives
and complications were also discussed. The patient understands and
wishes to proceed with the procedure. Written consent was obtained.

Ultrasound was performed to localize and mark an adequate pocket of
fluid in the left chest. The area was then prepped and draped in the
normal sterile fashion. 1% Lidocaine was used for local anesthesia.
Under ultrasound guidance a 6 Fr Safe-T-Centesis catheter was
introduced. Thoracentesis was performed. The catheter was removed
and a dressing applied.
FINDINGS: A total of approximately 1.8 liters of hazy,amber fluid was removed.
IMPRESSION: Successful ultrasound guided therapeutic left thoracentesis yielding
1.8 liters of pleural fluid.

## 2021-01-21 MED ORDER — DABRAFENIB MESYLATE 50 MG PO CAPS
100.0000 mg | ORAL_CAPSULE | Freq: Two times a day (BID) | ORAL | Status: DC
  Administered 2021-01-21 – 2021-01-22 (×2): 100 mg via ORAL

## 2021-01-21 MED ORDER — METFORMIN HCL 850 MG PO TABS
850.0000 mg | ORAL_TABLET | Freq: Two times a day (BID) | ORAL | Status: DC
  Administered 2021-01-21 – 2021-01-22 (×2): 850 mg via ORAL
  Filled 2021-01-21 (×3): qty 1

## 2021-01-21 MED ORDER — LIDOCAINE HCL 1 % IJ SOLN
INTRAMUSCULAR | Status: AC
  Filled 2021-01-21: qty 20

## 2021-01-21 NOTE — Progress Notes (Signed)
PROGRESS NOTE    Aydin Cavalieri  XKG:[id number] DOB: 1950-11-19 DOA: 01/18/2021 PCP: Gaynelle Cage, MD   Brief Narrative: 70 y.o. female with history of metastatic adenocarcinoma of the lung and also breast cancer has had 2D echo done following which patient was getting short of breath.  Patient has been having progressively worsening shortness of breath over the last few weeks.  Has known history of left pleural effusion.    the ER patient was mildly tachypneic and tachycardic.  Afebrile.  COVID test negative.  Chest x-ray shows complete opacification of the left side. She was admitted for thoracentesis and further work-up.  Assessment & Plan:   Principal Problem:   Pleural effusion on left Active Problems:   Adenocarcinoma of left lung, stage 4 (HCC)   Protein-calorie malnutrition, severe   Pleural effusion  #1  Left pleural effusion patient with history of adenocarcinoma status post 2.4 L of fluid taken off on 01/19/2021 chest x-ray before and after with no significant improvement.  Status post bronch 01/20/2021 with no endobronchial lesion noted however extrinsic compression was noted. Chest x-ray 01/20/2021 -large left effusion passive atelectasis opacified much of the left hemithorax.  More patchy airspace disease seen in the residual aerated portion of the left upper lobe.  Possible reexpansion edema or pneumonia.  Right lung relatively clear. Plan for repeat thoracentesis today by IR. Patient appears in no acute distress on room air. Dr. Marin Olp following  #2 moderate pericardial effusion with no signs of tamponade monitor closely.  #3 type 2 diabetes on SSI continue.CBG (last 3)  Recent Labs    01/20/21 1726 01/20/21 2033 01/21/21 0746  GLUCAP 162* 121* 96   Metformin restarted   #4 hypothyroidism on Synthroid  #5 constipation on senna and Dulcolax    Nutrition Problem: Severe Malnutrition Etiology: chronic illness, cancer and cancer related  treatments     Signs/Symptoms: percent weight loss, moderate fat depletion, severe muscle depletion Percent weight loss: 14.2 %    Interventions: Ensure Enlive (each supplement provides 350kcal and 20 grams of protein), Magic cup, MVI  Estimated body mass index is 24.31 kg/m as calculated from the following:   Height as of this encounter: _0  (1.575 m).   Weight as of this encounter: 60.3 kg.  DVT prophylaxis: SCD  code Status: Full code  family Communication: None at bedside Disposition Plan:  Status is: Inpatient  Remains inpatient appropriate because:Ongoing diagnostic testing needed not appropriate for outpatient work up  Dispo: The patient is from: Home              Anticipated d/c is to: Home              Patient currently is not medically stable to d/c.   Difficult to place patient No   Consultants:  Pulmonary interventional radiology and oncology  Procedures: Bronc 01/20/2021  Thoracentesis 01/19/2021 antimicrobials: None  Subjective: She is sitting up by the side of the bed she walked to the restroom she is not short of breath she continues to have constipation  She has told her husband to bring her equate brand of senna and prunes from home which works for her.  Objective: Vitals:   01/20/21 1150 01/20/21 1200 01/20/21 1223 01/20/21 2032  BP: (!) 152/98 (!) 140/96 (!) 136/93 (!) 139/98  Pulse: (!) 104 (!) 106 (!) 102 98  Resp: (!) 26 (!) 34 20   Temp:   97.9 F (36.6 C) 98 F (36.7 C)  TempSrc:   Oral  Oral  SpO2: 97% 96% 98% 99%  Weight:      Height:        Intake/Output Summary (Last 24 hours) at 01/21/2021 0930 Last data filed at 01/21/2021 6734 Gross per 24 hour  Intake 640 ml  Output 5 ml  Net 635 ml    Filed Weights   01/18/21 1333  Weight: 60.3 kg    Examination:  General exam: Appears calm and comfortable  Respiratory system: Diminished breath sounds on the left compared to the right to auscultation. Respiratory effort  normal. Cardiovascular system: S1 & S2 heard, RRR. No JVD, murmurs, rubs, gallops or clicks. No pedal edema. Gastrointestinal system: Abdomen is nondistended, soft and nontender. No organomegaly or masses felt. Normal bowel sounds heard. Central nervous system: Alert and oriented. No focal neurological deficits. Extremities: Symmetric 5 x 5 power. Skin: No rashes, lesions or ulcers Psychiatry: Judgement and insight appear normal. Mood & affect appropriate.     Data Reviewed: I have personally reviewed following labs and imaging studies  CBC: Recent Labs  Lab 01/18/21 1400 01/19/21 0406 01/20/21 0412 01/21/21 0410  WBC 6.1 6.0 6.6 6.0  NEUTROABS 3.9  --  3.6 3.3  HGB 10.9* 9.4* 11.3* 10.5*  HCT 34.0* 30.1* 35.7* 33.1*  MCV 86.3 88.8 87.1 86.9  PLT 531* 420* 467* 429*    Basic Metabolic Panel: Recent Labs  Lab 01/18/21 1400 01/19/21 0406 01/20/21 0412 01/21/21 0410  NA 134* 133* 133* 131*  K 3.7 3.9 3.8 3.7  CL 95* 96* 95* 95*  CO2 _0 GLUCOSE 123* 107* 108* 91  BUN _1 CREATININE 0.60 0.57 0.49 0.52  CALCIUM 9.1 9.1 8.7* 8.6*  MG  --   --  2.1 2.1    GFR: Estimated Creatinine Clearance: 55.2 mL/min (by C-G formula based on SCr of 0.52 mg/dL). Liver Function Tests: Recent Labs  Lab 01/18/21 1400  AST 17  ALT 14  ALKPHOS 105  BILITOT 0.3  PROT 7.3  ALBUMIN 3.3*    No results for input(s): LIPASE, AMYLASE in the last 168 hours. No results for input(s): AMMONIA in the last 168 hours. Coagulation Profile: No results for input(s): INR, PROTIME in the last 168 hours. Cardiac Enzymes: No results for input(s): CKTOTAL, CKMB, CKMBINDEX, TROPONINI in the last 168 hours. BNP (last 3 results) No results for input(s): PROBNP in the last 8760 hours. HbA1C: No results for input(s): HGBA1C in the last 72 hours. CBG: Recent Labs  Lab 01/20/21 0729 01/20/21 1152 01/20/21 1726 01/20/21 2033 01/21/21 0746  GLUCAP 108* 113* 162* 121* 96     Lipid Profile: No results for input(s): CHOL, HDL, LDLCALC, TRIG, CHOLHDL, LDLDIRECT in the last 72 hours. Thyroid Function Tests: Recent Labs    01/19/21 0723  TSH 12.988*    Anemia Panel: No results for input(s): VITAMINB12, FOLATE, FERRITIN, TIBC, IRON, RETICCTPCT in the last 72 hours. Sepsis Labs: No results for input(s): PROCALCITON, LATICACIDVEN in the last 168 hours.  Recent Results (from the past 240 hour(s))  Resp Panel by RT-PCR (Flu A&B, Covid) Nasopharyngeal Swab     Status: None   Collection Time: 01/18/21  2:10 PM   Specimen: Nasopharyngeal Swab; Nasopharyngeal(NP) swabs in vial transport medium  Result Value Ref Range Status   SARS Coronavirus 2 by RT PCR NEGATIVE NEGATIVE Final    Comment: (NOTE) SARS-CoV-2 target nucleic acids are NOT DETECTED.  The SARS-CoV-2 RNA is generally detectable in upper respiratory specimens during the  acute phase of infection. The lowest concentration of SARS-CoV-2 viral copies this assay can detect is 138 copies/mL. A negative result does not preclude SARS-Cov-2 infection and should not be used as the sole basis for treatment or other patient management decisions. A negative result may occur with  improper specimen collection/handling, submission of specimen other than nasopharyngeal swab, presence of viral mutation(s) within the areas targeted by this assay, and inadequate number of viral copies(<138 copies/mL). A negative result must be combined with clinical observations, patient history, and epidemiological information. The expected result is Negative.  Fact Sheet for Patients:  EntrepreneurPulse.com.au  Fact Sheet for Healthcare Providers:  IncredibleEmployment.be  This test is no t yet approved or cleared by the [place] FDA and  has been authorized for detection and/or diagnosis of SARS-CoV-2 by FDA under an Emergency Use Authorization (EUA). This EUA will remain  in effect  (meaning this test can be used) for the duration of the COVID-19 declaration under Section 564(b)(1) of the Act, 21 U.S.C.section 360bbb-3(b)(1), unless the authorization is terminated  or revoked sooner.       Influenza A by PCR NEGATIVE NEGATIVE Final   Influenza B by PCR NEGATIVE NEGATIVE Final    Comment: (NOTE) The Xpert Xpress SARS-CoV-2/FLU/RSV plus assay is intended as an aid in the diagnosis of influenza from Nasopharyngeal swab specimens and should not be used as a sole basis for treatment. Nasal washings and aspirates are unacceptable for Xpert Xpress SARS-CoV-2/FLU/RSV testing.  Fact Sheet for Patients: EntrepreneurPulse.com.au  Fact Sheet for Healthcare Providers: IncredibleEmployment.be  This test is not yet approved or cleared by the [place] FDA and has been authorized for detection and/or diagnosis of SARS-CoV-2 by FDA under an Emergency Use Authorization (EUA). This EUA will remain in effect (meaning this test can be used) for the duration of the COVID-19 declaration under Section 564(b)(1) of the Act, 21 U.S.C. section 360bbb-3(b)(1), unless the authorization is terminated or revoked.  Performed at [facility], [address] [place], [place] [address]   Culture, Respiratory w Gram Stain     Status: None (Preliminary result)   Collection Time: 01/20/21 11:20 AM   Specimen: Bronchoalveolar Lavage; Respiratory  Result Value Ref Range Status   Specimen Description   Final    BRONCHIAL ALVEOLAR LAVAGE LLL Performed at [place] [address] [place], [place] [address]    Special Requests   Final    NONE Performed at [facility], [place] [address], [place], [place] [address]    Gram Stain   Final    RARE WBC PRESENT,BOTH PMN AND MONONUCLEAR NO ORGANISMS SEEN Performed at [facility], [place] [address] [place], [place] [address]    Culture PENDING   Incomplete   Report Status PENDING  Incomplete          Radiology Studies: DG Chest 1 View  Result Date: 01/20/2021 CLINICAL DATA:  History of metastatic adenocarcinoma and breast cancer with shortness of breath following echocardiogram EXAM: CHEST  1 VIEW COMPARISON:  Radiograph and CT 01/19/2021 FINDINGS: Subtotal opacification of the left hemithorax with some minimally residually aerated lung in the left lung apex within which are areas of more patchy consolidative opacity quite similar appearance to comparison CT. Right lung is relatively clear. Cardiomediastinal contours are partly obscured by overlying opacity though appear somewhat more prominent than on comparison portable radiography possibly attributable to a pericardial effusion seen on CT imaging as well. Telemetry leads overlie  the chest. Degenerative changes are present in the imaged spine and shoulders. IMPRESSION: Large left effusion passive atelectasis opacified much of the left hemithorax. More patchy airspace disease is seen in the residually aerated portion of the left upper lobe. Possible re-expansion edema or pneumonia. Right lung is relatively clear. Prominent cardiac silhouette partly attributable to a pericardial effusion seen on CT. Electronically Signed   By: Lovena Le M.D.   On: 01/20/2021 19:18   DG Chest 1 View  Result Date: 01/19/2021 CLINICAL DATA:  Status post thoracentesis.  2.4 L removed. EXAM: CHEST  1 VIEW COMPARISON:  Chest radiograph 01/18/2021 FINDINGS: Post LEFT thoracentesis with return of the mediastinal structures to midline following fluid removal. A small amount of aerated lung in the LEFT upper lobe is not demonstrated although there is continued near complete opacification of the LEFT hemithorax. No pneumothorax evident. RIGHT lung is clear without edema or pneumothorax. IMPRESSION: 1. Return of mediastinal structures to midline following thoracentesis. 2. A small amount of aerated lung is now evident  in LEFT upper lobe following thoracentesis. LEFT hemithorax remains near completely opacified. 3. No pneumothorax. Electronically Signed   By: Suzy Bouchard M.D.   On: 01/19/2021 10:38   CT CHEST W CONTRAST  Result Date: 01/19/2021 CLINICAL DATA:  Abnormal chest x-ray. EXAM: CT CHEST WITH CONTRAST TECHNIQUE: Multidetector CT imaging of the chest was performed during intravenous contrast administration. CONTRAST:  73m OMNIPAQUE IOHEXOL 350 MG/ML SOLN COMPARISON:  Chest CT 04/16/2020.  Chest x-ray today. FINDINGS: Cardiovascular: Heart is normal size. Small pericardial effusion. Aorta is normal caliber. Mediastinum/Nodes: No mediastinal, hilar, or axillary adenopathy. Trachea and esophagus are unremarkable. Thyroid unremarkable. Lungs/Pleura: Large left pleural effusion. Only the left upper lobe is aerated with extensive airspace disease throughout the left upper lobe. Left lower lobe is collapsed which could be related to compressive atelectasis. No confluent opacity or effusion on the right. Linear atelectasis or scarring in the right middle lobe. Upper Abdomen: Imaging into the upper abdomen demonstrates no acute findings. Musculoskeletal: Chest wall soft tissues are unremarkable. There is a new sclerotic lesion within the anterior L1 vertebral body. No additional focal bone lesion or acute bony abnormality. IMPRESSION: Large left pleural effusion. Complete opacification of the left lower lobe with some aeration of the left upper lobe. Extensive airspace disease throughout the left upper lobe. This could reflect pneumonia or re-expansion edema following prior thoracentesis. No pneumothorax. Small pericardial effusion. New sclerotic lesion anteriorly within the L1 vertebral body. Cannot exclude sclerotic metastasis. Consider further evaluation with nuclear medicine bone scan or MRI. Electronically Signed   By: KRolm BaptiseM.D.   On: 01/19/2021 18:38   UK[place]THORACENTESIS ASP PLEURAL SPACE W/IMG  GUIDE  Result Date: 01/19/2021 INDICATION: Patient with history of metastatic lung cancer, dyspnea, left pleural effusion. Request received for diagnostic and therapeutic left thoracentesis. EXAM: ULTRASOUND GUIDED DIAGNOSTIC AND THERAPEUTIC LEFT THORACENTESIS MEDICATIONS: 1% lidocaine to skin and subcutaneous tissue COMPLICATIONS: None immediate. PROCEDURE: An ultrasound guided thoracentesis was thoroughly discussed with the patient and questions answered. The benefits, risks, alternatives and complications were also discussed. The patient understands and wishes to proceed with the procedure. Written consent was obtained. Ultrasound was performed to localize and mark an adequate pocket of fluid in the left chest. The area was then prepped and draped in the normal sterile fashion. 1% Lidocaine was used for local anesthesia. Under ultrasound guidance a 6 Fr Safe-T-Centesis catheter was introduced. Thoracentesis was performed. The catheter was removed and a dressing applied.  FINDINGS: A total of approximately 2.4 liters of hazy,amber fluid was removed. Samples were sent to the laboratory as requested by the clinical team. Due to pt coughing /chest discomfort only the above amount of fluid was removed today. IMPRESSION: Successful ultrasound guided diagnostic and therapeutic left thoracentesis yielding 2.4 liters of pleural fluid. Read by: Rowe Robert, PA-C Electronically Signed   By: Jerilynn Mages.  Shick M.D.   On: 01/19/2021 10:21        Scheduled Meds:  bisacodyl  10 mg Oral Daily   bisacodyl  10 mg Rectal Once   dabrafenib mesylate  150 mg Oral BID   dronabinol  5 mg Oral BID AC   feeding supplement  237 mL Oral TID BM   insulin aspart  0-9 Units Subcutaneous TID WC   letrozole  2.5 mg Oral Daily   metFORMIN  850 mg Oral BID WC   multivitamin with minerals  1 tablet Oral Daily   polyethylene glycol  17 g Oral Daily   senna-docusate  2 tablet Oral BID   thyroid  60 mg Oral Daily   trametinib dimethyl  sulfoxide  2 mg Oral Daily   Continuous Infusions:   LOS: 1 day    Time spent: 45 min    Georgette Shell, MD 01/21/2021, 9:30 AM

## 2021-01-21 NOTE — Progress Notes (Signed)
Overall, she is making a little bit of improvement.  Show the bronchoscopy yesterday.  This would not show any type of airway obstruction.  She still has quite a bit of pleural effusion.  She had a CT scan done on Tuesday.  This showed a lot of effusion.  This was even after the thoracentesis.  I think another thoracentesis would not be a bad idea.  The cytology on the fluid came back with adenocarcinoma.  This really is no surprise.  I do think that she will need to have immunotherapy added to the targeted therapy.  Her labs show a white cell count of 6.  Hemoglobin 10.5.  Platelet count 429,000.  Her BUN is 14 and creatinine 0.52.  Her calcium is 8.6.  Her appetite is doing a little bit better.  Maybe, the Marinol might be helping.  She is having a little bit of pain.  However this seems to be fairly well controlled.  Is no bleeding.  She does not feel as short of breath.  She is not coughing as much.  I do think that she would benefit from another thoracentesis.  It sounds like there is still quite a bit of fluid in the left lung.  I am sure that she has pleural involvement by the malignancy.  Maybe, if she continues to improve, we will be able to see about let her go home by the weekend or over the weekend.  I know that she still is interested in her alternative therapies.  I know this is very important for her.  She would agree to the immunotherapy added to the Mekinist/Tafinlar.  I do appreciate the outstanding care she is getting from all the staff on [address].  Lattie Haw, MD  Psalm 5:12

## 2021-01-21 NOTE — Procedures (Signed)
Ultrasound-guided therapeutic left thoracentesis performed yielding 1.8 liters of hazy, amber fluid. No immediate complications. Follow-up chest x-ray pending. DUE TO PT COUGHING/CHEST DISCOMFORT ONLY THE ABOVE AMOUNT OF FLUID WAS REMOVED TODAY. EBL none.

## 2021-01-21 NOTE — TOC Progression Note (Signed)
Transition of Care [facility]) - Progression Note    Patient Details  Name: Tami Stone MRN: [id number] Date of Birth: 01-Feb-1951  [facility] of [facility]) CM/SW Contact  Purcell Mouton, RN Phone Number: 01/21/2021, 3:45 PM  Clinical Narrative:    TOC will continue to follow for discharge needs. Pt from home with spouse and disabled adult child.    Expected Discharge Plan: Home/Self Care Barriers to Discharge: No Barriers Identified  Expected Discharge Plan and Services Expected Discharge Plan: Home/Self Care       Living arrangements for the past 2 months: Single Family Home Expected Discharge Date: 01/19/21                                     Social Determinants of Health (SDOH) Interventions    Readmission Risk Interventions No flowsheet data found.

## 2021-01-21 NOTE — Progress Notes (Signed)
NAMEChalsey Stone, MRN:  [id number], DOB:  [birth year], LOS: 1 ADMISSION DATE:  01/18/2021, CONSULTATION DATE:  01/19/21 REFERRING MD:  Dr Sabino Gasser, CHIEF COMPLAINT:  SOB   History of Present Illness:  70 y/o F who presented to [facility] ER on 8/8 with reports of at least one month of shortness of breath.    She is followed at [facility] by Dr. Arnoldo Morale for Oncology.  She has not been taking her prescribed medications regularly as she "does not want it to compromise her immune system".  She is followed by an Integrative Medicine Specialist who prescribed Ivermectin oil and "Protect All" as additional therapy.  She sought a second opinion from Dr. Marin Olp.     The patient reports she has been short of breath for at least one month and has had a dry cough during that time.  In the last two weeks she has began producing thick sputum.  She notes a 40 lb weight loss since November.  She has had progressive difficulty with shortness of breath that interferes with her activity tolerance.  She has had a pleural effusion present at least since 11/2020.  On presentation, she was tachypneic and tachycardic.  COVID testing was negative.  CXR showed complete opacification of the left side with mediastinal shift to the right.  She was admitted per [facility] for further evaluation.  A thoracentesis was completed by IR with 2.4L fluid removed. Cytology pending.  Post thora CXR evaluation shows residual opacification of the left hemithorax but without mediastinal shift.    PCCM consulted for pulmonary evaluation.   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  S/p thoracentesis with 2.4 L removed 8/9 PCCM consulted 8/10 bronchoscopy  Interim History / Subjective:  No overnight events, feels better  Objective   Blood pressure (!) 139/98, pulse 98, temperature 98 F (36.7 C), temperature source Oral, resp. rate 20, height 5' 2"  (1.575 m), weight 60.3 kg, SpO2 99 %.        Intake/Output Summary (Last 24 hours) at 01/21/2021 0925 Last data filed at 01/21/2021 3567 Gross per 24 hour  Intake 640 ml  Output 5 ml  Net 635 ml   Filed Weights   01/18/21 1333  Weight: 60.3 kg    Examination: General: Chronically ill-appearing HENT: Moist oral mucosa Lungs: Decreased air movement on the left Cardiovascular: S1-S2 appreciated Abdomen: Soft, bowel sounds appreciated Extremities: No clubbing, no edema Neuro: Alert and oriented x3 GU:   Resolved Hospital Problem list     Assessment & Plan:  Metastatic adenocarcinoma of the lung Mets to the bone BRAF mutated Left pleural effusion with opacification of the left hemithorax  S/p bronchoscopy -Cytology pending  Cytology from pleural fluid showing adenocarcinoma  Patient will be scheduled for a thoracentesis today  Anemia  Type 2 diabetes  Stable for discharge following thoracentesis from  a pulmonary perspective  Sherrilyn Rist, MD [place] PCCM Pager: See [name]

## 2021-01-22 ENCOUNTER — Telehealth: Payer: Self-pay | Admitting: Internal Medicine

## 2021-01-22 DIAGNOSIS — J90 Pleural effusion, not elsewhere classified: Secondary | ICD-10-CM | POA: Diagnosis not present

## 2021-01-22 LAB — BASIC METABOLIC PANEL
Anion gap: 9 (ref 5–15)
BUN: 20 mg/dL (ref 8–23)
CO2: 30 mmol/L (ref 22–32)
Calcium: 9.4 mg/dL (ref 8.9–10.3)
Chloride: 96 mmol/L — ABNORMAL LOW (ref 98–111)
Creatinine, Ser: 0.57 mg/dL (ref 0.44–1.00)
GFR, Estimated: >60 mL/min (ref >60)
Glucose, Bld: 115 mg/dL — ABNORMAL HIGH (ref 70–99)
Potassium: 4 mmol/L (ref 3.5–5.1)
Sodium: 135 mmol/L (ref 135–145)

## 2021-01-22 LAB — CULTURE, RESPIRATORY W GRAM STAIN

## 2021-01-22 LAB — CBC WITH DIFFERENTIAL/PLATELET
Abs Immature Granulocytes: 0.03 10*3/uL (ref 0.00–0.07)
Basophils Absolute: 0 10*3/uL (ref 0.0–0.1)
Basophils Relative: 0 %
Eosinophils Absolute: 0.3 10*3/uL (ref 0.0–0.5)
Eosinophils Relative: 3 %
HCT: 37.1 % (ref 36.0–46.0)
Hemoglobin: 11.6 g/dL — ABNORMAL LOW (ref 12.0–15.0)
Immature Granulocytes: 0 %
Lymphocytes Relative: 29 %
Lymphs Abs: 2.4 10*3/uL (ref 0.7–4.0)
MCH: 27.5 pg (ref 26.0–34.0)
MCHC: 31.3 g/dL (ref 30.0–36.0)
MCV: 87.9 fL (ref 80.0–100.0)
Monocytes Absolute: 0.7 10*3/uL (ref 0.1–1.0)
Monocytes Relative: 9 %
Neutro Abs: 4.9 10*3/uL (ref 1.7–7.7)
Neutrophils Relative %: 59 %
Platelets: 508 10*3/uL — ABNORMAL HIGH (ref 150–400)
RBC: 4.22 MIL/uL (ref 3.87–5.11)
RDW: 15.7 % — ABNORMAL HIGH (ref 11.5–15.5)
WBC: 8.4 10*3/uL (ref 4.0–10.5)
nRBC: 0 % (ref 0.0–0.2)

## 2021-01-22 LAB — MAGNESIUM: Magnesium: 2 mg/dL (ref 1.7–2.4)

## 2021-01-22 LAB — CYTOLOGY - NON PAP

## 2021-01-22 LAB — GLUCOSE, CAPILLARY
Glucose-Capillary: 101 mg/dL — ABNORMAL HIGH (ref 70–99)
Glucose-Capillary: 170 mg/dL — ABNORMAL HIGH (ref 70–99)

## 2021-01-22 MED ORDER — POLYETHYLENE GLYCOL 3350 17 G PO PACK: 17.0000 g | PACK | Freq: Every day | ORAL | 0 refills | Status: AC

## 2021-01-22 NOTE — Progress Notes (Signed)
Tami Stone is feeling better.  She had another 1.8 L of fluid removed yesterday.  Her chest x-ray does look a lot better.  She still has little bit of shortness of breath.  She is not wearing any oxygen.  She is using the incentive spirometer.  She has had no bleeding.  There has been a little bit of diarrhea.  This probably is from the stool softeners and the metformin.  She has had no rashes.  She has had no problems with pain.  Her appetite is doing fairly well.  Her labs show white cell count 8.4.  Hemoglobin 11.6.  Platelet count 5 through 8000.  Her sodium is 135.  Potassium 4.0.  BUN is 28 creatinine 0.57.  I think we can probably get her off the cardiac monitor.  She is in normal sinus rhythm.  Her vital signs are temperature 98.3.  Pulse 94.  Blood pressure 123/74.  Oxygen saturation on room air is 90%.  Her lungs sound better on the left side.  She has better air movement.  Right side is clear.  Cardiac exam regular rate and rhythm.  Abdomen is soft.  There is no palpable liver or spleen tip.  Bowel sounds are present.  Extremity shows no clubbing, cyanosis or edema.  Neurological exam is nonfocal.  Hopefully, Tami Stone will be able to go home today.  From my point of view, I think we are okay for her to go home.  I think would tolerate need to try to get her on immunotherapy along with the Mekinist/Tafinlar.  I think we should be able to get more "bang for our buck."  I can set this up for next week.  She would be agreeable to this.  I think if she has problems with recurrent pleural effusion, she is probably going to need a Pleurx catheter.  She would not want to have one right now.  She is still going to continue with her complementary therapies.  From my perspective, I will see how this is going to harm her in any way.  I do appreciate the outstanding care she is got from all staff on [address].  Lattie Haw, MD  Psalm 34:8

## 2021-01-22 NOTE — Discharge Summary (Signed)
Physician Discharge Summary  Maxima Skelton ZDG:[id number] DOB: 03-17-1951 DOA: 01/18/2021  PCP: Gaynelle Cage, MD  Admit date: 01/18/2021 Discharge date: 01/22/2021  Admitted From: Home Disposition Home Recommendations for Outpatient Follow-up:  Follow up with PCP in 1-2 weeks Please obtain BMP/CBC in one week Please follow-up with Dr. Marin Olp Follow-up with pulmonologist   Home Health: None Equipment/Devices: None  Discharge Condition: Stable CODE STATUS full code Diet recommendation: Cardiac Brief/Interim Summary:70 y.o. female with history of metastatic adenocarcinoma of the lung and also breast cancer has had 2D echo done following which patient was getting short of breath.  Patient has been having progressively worsening shortness of breath over the last few weeks.  Has known history of left pleural effusion.    the ER patient was mildly tachypneic and tachycardic.  Afebrile.  COVID test negative.  Chest x-ray shows complete opacification of the left side. She was admitted for thoracentesis and further work-up.   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  She had thoracentesis x2 done during this admission of 4 days that she was in the hospital.  Initially 2.4 L were taken out.  Second attempt 1.8 L was taken out and the procedure was stopped as patient started to have pleuritic chest pain.   She had a bronc 01/20/2021 by Dr. Gala Murdoch  with no endobronchial lesion noted however extrinsic compression was seen. Chest x-ray 01/20/2021 -large left effusion passive atelectasis opacified much of the left hemithorax.  More patchy airspace disease seen in the residual aerated portion  of the left upper lobe.  Possible reexpansion edema or pneumonia.  Right lung relatively clear.   #2 moderate pericardial effusion with no signs of tamponade    #3 type 2 diabetes continue metformin   #4 hypothyroidism on Synthroid   #5 constipation on senna and Dulcolax    Nutrition Problem: Severe Malnutrition Etiology: chronic illness, cancer and cancer related treatments    Signs/Symptoms: percent weight loss, moderate fat depletion, severe muscle depletion Percent weight loss: 14.2 %     Interventions: Ensure Enlive (each supplement provides 350kcal and 20 grams of protein), Magic cup, MVI  Estimated body mass index is 24.31 kg/m as calculated from the following:   Height as of this encounter: _0  (1.575 m).   Weight as of this encounter: 60.3 kg.  Discharge Instructions  Discharge Instructions     Diet - low sodium heart healthy   Complete by: As directed    Increase activity slowly   Complete by: As directed    Increase activity slowly   Complete by: As directed       Allergies as of 01/22/2021   No Known Allergies      Medication List     TAKE these medications    CO Q 10 PO Take 300 mg by mouth daily.   DANDELION PO Take 1 capsule by mouth daily.   ibuprofen 200 MG tablet Commonly known as: ADVIL Take 400 mg by mouth every 6 (six) hours as needed for mild pain.   IVERMECTIN PO Take 1.8 mLs by mouth daily. Ivermectin oil - 10 mg/ml   letrozole 2.5 MG tablet Commonly known as: FEMARA Take  2.5 mg by mouth daily.   Magic Mushroom Mix Caps Take 1 tablet by mouth daily.   metFORMIN 750 MG 24 hr tablet Commonly known as: GLUCOPHAGE-XR Take 750 mg by mouth 2 (two) times daily.   multivitamin with minerals tablet Take 1 tablet by mouth daily.   NP Thyroid 60 MG tablet Generic drug: thyroid Take 60 mg by mouth daily.   OVER THE COUNTER MEDICATION Take 2 capsules by mouth 3 (three) times daily. Protectival   polyethylene glycol 17  g packet Commonly known as: MIRALAX / GLYCOLAX Take 17 g by mouth daily. Start taking on: January 23, 2021   Tafinlar 50 MG capsule Generic drug: dabrafenib mesylate Take 100 mg by mouth 2 (two) times daily. Take on an empty stomach 1 hour before or 2 hours after meals.   trametinib dimethyl sulfoxide 2 MG tablet Commonly known as: MEKINIST Take 1 tablet (2 mg total) by mouth daily. Take 1 hour before or 2 hours after a meal. Store refrigerated in original container.   Zinc 100 MG Tabs Take 100 mg by mouth daily.        Follow-up Information     Gaynelle Cage, MD Follow up.   Specialty: General Practice Contact information: [place] [address] [phone]         Volanda Napoleon, MD Follow up.   Specialty: Oncology Contact information: [address] [place] [address] [phone]         Magdalen Spatz, NP Follow up.   Specialty: Pulmonary Disease Why: you have appointment 02/08/21 Contact information: [place] [address] [address] [phone]                No Known Allergies Consults PCCM and oncology   Procedures/Studies: DG Chest 1 View  Result Date: 01/21/2021 CLINICAL DATA:  Post thoracentesis EXAM: CHEST  1 VIEW COMPARISON:  Chest radiograph 1 day prior FINDINGS: The cardiomediastinal silhouette is within normal limits. The left pleural effusion has decreased in size following thoracentesis with improved aeration of the upper lung. No pneumothorax is seen; a vertically line projecting over the mid left hemithorax likely reflects skinfold artifact. The right lung is clear. There is no right pleural effusion or pneumothorax The bones are stable. IMPRESSION: Decreased left pleural effusion with improved aeration of the left lung following thoracentesis. No pneumothorax identified. Electronically Signed   By: Valetta Mole MD   On: 01/21/2021 13:15   DG Chest 1 View  Result Date:  01/20/2021 CLINICAL DATA:  History of metastatic adenocarcinoma and breast cancer with shortness of breath following echocardiogram EXAM: CHEST  1 VIEW COMPARISON:  Radiograph and CT 01/19/2021 FINDINGS: Subtotal opacification of the left hemithorax with some minimally residually aerated lung in the left lung apex within which are areas of more patchy consolidative opacity quite similar appearance to comparison CT. Right lung is relatively clear. Cardiomediastinal contours are partly obscured by overlying opacity though appear somewhat more prominent than on comparison portable radiography possibly attributable to a pericardial effusion seen on CT imaging as well. Telemetry leads overlie the chest. Degenerative changes are present in the imaged spine and shoulders. IMPRESSION: Large left effusion passive atelectasis opacified much of the left hemithorax. More patchy airspace disease is seen in the residually aerated portion of the left upper lobe. Possible re-expansion edema or pneumonia. Right lung is relatively clear. Prominent cardiac silhouette partly attributable to a pericardial effusion seen on CT. Electronically  Signed   By: Lovena Le M.D.   On: 01/20/2021 19:18   DG Chest 1 View  Result Date: 01/19/2021 CLINICAL DATA:  Status post thoracentesis.  2.4 L removed. EXAM: CHEST  1 VIEW COMPARISON:  Chest radiograph 01/18/2021 FINDINGS: Post LEFT thoracentesis with return of the mediastinal structures to midline following fluid removal. A small amount of aerated lung in the LEFT upper lobe is not demonstrated although there is continued near complete opacification of the LEFT hemithorax. No pneumothorax evident. RIGHT lung is clear without edema or pneumothorax. IMPRESSION: 1. Return of mediastinal structures to midline following thoracentesis. 2. A small amount of aerated lung is now evident in LEFT upper lobe following thoracentesis. LEFT hemithorax remains near completely opacified. 3. No pneumothorax.  Electronically Signed   By: Suzy Bouchard M.D.   On: 01/19/2021 10:38   CT CHEST W CONTRAST  Result Date: 01/19/2021 CLINICAL DATA:  Abnormal chest x-ray. EXAM: CT CHEST WITH CONTRAST TECHNIQUE: Multidetector CT imaging of the chest was performed during intravenous contrast administration. CONTRAST:  69m OMNIPAQUE IOHEXOL 350 MG/ML SOLN COMPARISON:  Chest CT 04/16/2020.  Chest x-ray today. FINDINGS: Cardiovascular: Heart is normal size. Small pericardial effusion. Aorta is normal caliber. Mediastinum/Nodes: No mediastinal, hilar, or axillary adenopathy. Trachea and esophagus are unremarkable. Thyroid unremarkable. Lungs/Pleura: Large left pleural effusion. Only the left upper lobe is aerated with extensive airspace disease throughout the left upper lobe. Left lower lobe is collapsed which could be related to compressive atelectasis. No confluent opacity or effusion on the right. Linear atelectasis or scarring in the right middle lobe. Upper Abdomen: Imaging into the upper abdomen demonstrates no acute findings. Musculoskeletal: Chest wall soft tissues are unremarkable. There is a new sclerotic lesion within the anterior L1 vertebral body. No additional focal bone lesion or acute bony abnormality. IMPRESSION: Large left pleural effusion. Complete opacification of the left lower lobe with some aeration of the left upper lobe. Extensive airspace disease throughout the left upper lobe. This could reflect pneumonia or re-expansion edema following prior thoracentesis. No pneumothorax. Small pericardial effusion. New sclerotic lesion anteriorly within the L1 vertebral body. Cannot exclude sclerotic metastasis. Consider further evaluation with nuclear medicine bone scan or MRI. Electronically Signed   By: KRolm BaptiseM.D.   On: 01/19/2021 18:38   DG Chest Port 1 View  Result Date: 01/18/2021 CLINICAL DATA:  Shortness of breath.  Stage IV lung cancer EXAM: PORTABLE CHEST 1 VIEW COMPARISON:  04/30/2020 FINDINGS:  Complete opacification of the left hemithorax with rightward displacement of the heart and mediastinal structures. The visualized portions of the right lung are grossly clear. No appreciable right-sided pleural fluid collection. No pneumothorax. IMPRESSION: Complete opacification of the left hemithorax. Findings likely represent a combination of a large pleural effusion and atelectasis. Electronically Signed   By: NDavina PokeD.O.   On: 01/18/2021 14:18   ECHOCARDIOGRAM COMPLETE  Result Date: 01/18/2021    ECHOCARDIOGRAM REPORT   Patient Name:   RJANITA CAMBEROSDate of Exam: 01/18/2021 Medical Rec #:  [id number]         Height:       62.0 in Accession #:    [id number]        Weight:       133.0 lb Date of Birth:  [birth year]         BSA:          1.607 m Patient Age:    89years  BP:           165/110 mmHg Patient Gender: F                  HR:           101 bpm. Exam Location:  [facility] Procedure: 2D Echo, Cardiac Doppler, Color Doppler and Strain Analysis                    STAT ECHO Reported to: Dr Stanford Breed on 01/18/2021 1:35:00 PM                   tamponade. Indications:    Pericardial effusion  History:        Patient has no prior history of Echocardiogram examinations.  Sonographer:    Luisa Hart RDCS Referring Phys: [place]  1. Left ventricular ejection fraction, by estimation, is 55 to 60%. The left ventricle has normal function. The left ventricle has no regional wall motion abnormalities. Left ventricular diastolic parameters are consistent with Grade I diastolic dysfunction (impaired relaxation).  2. Right ventricular systolic function is normal. The right ventricular size is normal.  3. Grossly no evidence of tamponade. Moderate pericardial effusion. The pericardial effusion is circumferential. Large pleural effusion in the left lateral region.  4. The mitral valve is normal in structure. Mild mitral valve regurgitation. No evidence of mitral stenosis.  5.  The aortic valve is normal in structure. Aortic valve regurgitation is not visualized. No aortic stenosis is present.  6. The inferior vena cava is normal in size with greater than 50% respiratory variability, suggesting right atrial pressure of 3 mmHg. FINDINGS  Left Ventricle: Left ventricular ejection fraction, by estimation, is 55 to 60%. The left ventricle has normal function. The left ventricle has no regional wall motion abnormalities. The left ventricular internal cavity size was normal in size. There is  no left ventricular hypertrophy. Left ventricular diastolic parameters are consistent with Grade I diastolic dysfunction (impaired relaxation). Right Ventricle: The right ventricular size is normal. No increase in right ventricular wall thickness. Right ventricular systolic function is normal. Left Atrium: Left atrial size was normal in size. Right Atrium: Right atrial size was normal in size. Pericardium: Grossly no evidence of tamponade. A moderately sized pericardial effusion is present. The pericardial effusion is circumferential. Mitral Valve: The mitral valve is normal in structure. Mild mitral valve regurgitation. No evidence of mitral valve stenosis. Tricuspid Valve: The tricuspid valve is normal in structure. Tricuspid valve regurgitation is not demonstrated. No evidence of tricuspid stenosis. Aortic Valve: The aortic valve is normal in structure. Aortic valve regurgitation is not visualized. No aortic stenosis is present. Aortic valve mean gradient measures 2.0 mmHg. Aortic valve peak gradient measures 6.2 mmHg. Pulmonic Valve: The pulmonic valve was normal in structure. Pulmonic valve regurgitation is not visualized. No evidence of pulmonic stenosis. Aorta: The aortic root is normal in size and structure. Venous: The inferior vena cava is normal in size with greater than 50% respiratory variability, suggesting right atrial pressure of 3 mmHg. IAS/Shunts: No atrial level shunt detected by color  flow Doppler. Additional Comments: There is a large pleural effusion in the left lateral region.   LV Volumes (MOD) LV vol d, MOD A2C: 51.1 ml Diastology LV vol d, MOD A4C: 48.4 ml LV e' medial:    4.68 cm/s LV vol s, MOD A2C: 37.5 ml LV E/e' medial:  14.0 LV vol s, MOD A4C: 26.8 ml LV e'  lateral:   5.33 cm/s LV SV MOD A2C:     13.6 ml LV E/e' lateral: 12.3 LV SV MOD A4C:     48.4 ml LV SV MOD BP:      18.8 ml LEFT ATRIUM             Index LA Vol (A2C):   33.3 ml 20.72 ml/m LA Vol (A4C):   28.1 ml 17.48 ml/m LA Biplane Vol: 31.5 ml 19.60 ml/m  AORTIC VALVE AV Vmax:           125.00 cm/s AV Vmean:          71.300 cm/s AV VTI:            0.176 m AV Peak Grad:      6.2 mmHg AV Mean Grad:      2.0 mmHg LVOT Vmax:         80.00 cm/s LVOT Vmean:        53.100 cm/s LVOT VTI:          0.116 m LVOT/AV VTI ratio: 0.66 MITRAL VALVE MV Area (PHT): 4.39 cm    SHUNTS MV Decel Time: 173 msec    Systemic VTI: 0.12 m MR Peak grad: 151.8 mmHg MR Vmax:      616.00 cm/s MV E velocity: 65.60 cm/s MV A velocity: 93.50 cm/s MV E/A ratio:  0.70 Jenne Campus MD Electronically signed by Jenne Campus MD Signature Date/Time: 01/18/2021/3:59:55 PM    Final    US THORACENTESIS ASP PLEURAL SPACE W/IMG GUIDE  Result Date: 01/21/2021 INDICATION: Patient with history of metastatic lung cancer, dyspnea, recurrent left pleural effusion. Request received for therapeutic left thoracentesis. EXAM: ULTRASOUND GUIDED THERAPEUTIC LEFT THORACENTESIS MEDICATIONS: 1% lidocaine to skin and subcutaneous tissue COMPLICATIONS: None immediate. PROCEDURE: An ultrasound guided thoracentesis was thoroughly discussed with the patient and questions answered. The benefits, risks, alternatives and complications were also discussed. The patient understands and wishes to proceed with the procedure. Written consent was obtained. Ultrasound was performed to localize and mark an adequate pocket of fluid in the left chest. The area was then prepped and draped in  the normal sterile fashion. 1% Lidocaine was used for local anesthesia. Under ultrasound guidance a 6 Fr Safe-T-Centesis catheter was introduced. Thoracentesis was performed. The catheter was removed and a dressing applied. FINDINGS: A total of approximately 1.8 liters of hazy,amber fluid was removed. IMPRESSION: Successful ultrasound guided therapeutic left thoracentesis yielding 1.8 liters of pleural fluid. Read by: Rowe Robert, PA-C Electronically Signed   By: Albin Felling MD   On: 01/21/2021 12:52   US THORACENTESIS ASP PLEURAL SPACE W/IMG GUIDE  Result Date: 01/19/2021 INDICATION: Patient with history of metastatic lung cancer, dyspnea, left pleural effusion. Request received for diagnostic and therapeutic left thoracentesis. EXAM: ULTRASOUND GUIDED DIAGNOSTIC AND THERAPEUTIC LEFT THORACENTESIS MEDICATIONS: 1% lidocaine to skin and subcutaneous tissue COMPLICATIONS: None immediate. PROCEDURE: An ultrasound guided thoracentesis was thoroughly discussed with the patient and questions answered. The benefits, risks, alternatives and complications were also discussed. The patient understands and wishes to proceed with the procedure. Written consent was obtained. Ultrasound was performed to localize and mark an adequate pocket of fluid in the left chest. The area was then prepped and draped in the normal sterile fashion. 1% Lidocaine was used for local anesthesia. Under ultrasound guidance a 6 Fr Safe-T-Centesis catheter was introduced. Thoracentesis was performed. The catheter was removed and a dressing applied. FINDINGS: A total of approximately 2.4 liters of hazy,amber fluid was removed. Samples  were sent to the laboratory as requested by the clinical team. Due to pt coughing /chest discomfort only the above amount of fluid was removed today. IMPRESSION: Successful ultrasound guided diagnostic and therapeutic left thoracentesis yielding 2.4 liters of pleural fluid. Read by: Rowe Robert, PA-C Electronically  Signed   By: Jerilynn Mages.  Shick M.D.   On: 01/19/2021 10:21   (Echo, Carotid, EGD, Colonoscopy, ERCP)    Subjective: Patient is resting in bed anxious to go home denies any nausea vomiting  Discharge Exam: Vitals:   01/21/21 2059 01/22/21 0605  BP: 109/76 123/74  Pulse: 98 94  Resp: 18 16  Temp: 98.7 F (37.1 C) 98.3 F (36.8 C)  SpO2: 92% 90%   Vitals:   01/21/21 1100 01/21/21 1231 01/21/21 2059 01/22/21 0605  BP: (!) 118/92 128/79 109/76 123/74  Pulse:   98 94  Resp:   18 16  Temp:   98.7 F (37.1 C) 98.3 F (36.8 C)  TempSrc:    Oral  SpO2:   92% 90%  Weight:      Height:        General: Pt is alert, awake, not in acute distress Cardiovascular: RRR, S1/S2 +, no rubs, no gallops Respiratory diminished breath sounds throughout the left  no wheezing, no rhonchi Abdominal: Soft, NT, ND, bowel sounds + Extremities: no edema, no cyanosis    The results of significant diagnostics from this hospitalization (including imaging, microbiology, ancillary and laboratory) are listed below for reference.     Microbiology: Recent Results (from the past 240 hour(s))  Resp Panel by RT-PCR (Flu A&B, Covid) Nasopharyngeal Swab     Status: None   Collection Time: 01/18/21  2:10 PM   Specimen: Nasopharyngeal Swab; Nasopharyngeal(NP) swabs in vial transport medium  Result Value Ref Range Status   SARS Coronavirus 2 by RT PCR NEGATIVE NEGATIVE Final    Comment: (NOTE) SARS-CoV-2 target nucleic acids are NOT DETECTED.  The SARS-CoV-2 RNA is generally detectable in upper respiratory specimens during the acute phase of infection. The lowest concentration of SARS-CoV-2 viral copies this assay can detect is 138 copies/mL. A negative result does not preclude SARS-Cov-2 infection and should not be used as the sole basis for treatment or other patient management decisions. A negative result may occur with  improper specimen collection/handling, submission of specimen other than nasopharyngeal  swab, presence of viral mutation(s) within the areas targeted by this assay, and inadequate number of viral copies(<138 copies/mL). A negative result must be combined with clinical observations, patient history, and epidemiological information. The expected result is Negative.  Fact Sheet for Patients:  EntrepreneurPulse.com.au  Fact Sheet for Healthcare Providers:  IncredibleEmployment.be  This test is no t yet approved or cleared by the [place] FDA and  has been authorized for detection and/or diagnosis of SARS-CoV-2 by FDA under an Emergency Use Authorization (EUA). This EUA will remain  in effect (meaning this test can be used) for the duration of the COVID-19 declaration under Section 564(b)(1) of the Act, 21 U.S.C.section 360bbb-3(b)(1), unless the authorization is terminated  or revoked sooner.       Influenza A by PCR NEGATIVE NEGATIVE Final   Influenza B by PCR NEGATIVE NEGATIVE Final    Comment: (NOTE) The Xpert Xpress SARS-CoV-2/FLU/RSV plus assay is intended as an aid in the diagnosis of influenza from Nasopharyngeal swab specimens and should not be used as a sole basis for treatment. Nasal washings and aspirates are unacceptable for Xpert Xpress SARS-CoV-2/FLU/RSV testing.  Fact Sheet  for Patients: EntrepreneurPulse.com.au  Fact Sheet for Healthcare Providers: IncredibleEmployment.be  This test is not yet approved or cleared by the [place] FDA and has been authorized for detection and/or diagnosis of SARS-CoV-2 by FDA under an Emergency Use Authorization (EUA). This EUA will remain in effect (meaning this test can be used) for the duration of the COVID-19 declaration under Section 564(b)(1) of the Act, 21 U.S.C. section 360bbb-3(b)(1), unless the authorization is terminated or revoked.  Performed at [facility], [address] [place], [place] [address]    Culture, Respiratory w Gram Stain     Status: None   Collection Time: 01/20/21 11:20 AM   Specimen: Bronchoalveolar Lavage; Respiratory  Result Value Ref Range Status   Specimen Description   Final    BRONCHIAL ALVEOLAR LAVAGE LLL Performed at [facility] [address], [place], [place] [address]    Special Requests   Final    NONE Performed at [facility], [place] [address] [place], [place] [address]    Gram Stain   Final    RARE WBC PRESENT,BOTH PMN AND MONONUCLEAR NO ORGANISMS SEEN    Culture   Final    FEW GROUP B STREP(S.AGALACTIAE)ISOLATED TESTING AGAINST S. AGALACTIAE NOT ROUTINELY PERFORMED DUE TO PREDICTABILITY OF AMP/PEN/VAN SUSCEPTIBILITY. Performed at [facility], [place] [address] [place], [place] [address]    Report Status 01/22/2021 FINAL  Final     Labs: BNP (last 3 results) Recent Labs    01/18/21 1400  BNP 00.8   Basic Metabolic Panel: Recent Labs  Lab 01/18/21 1400 01/19/21 0406 01/20/21 0412 01/21/21 0410 01/22/21 0414  NA 134* 133* 133* 131* 135  K 3.7 3.9 3.8 3.7 4.0  CL 95* 96* 95* 95* 96*  CO2 _0 GLUCOSE 123* 107* 108* 91 115*  BUN _1 CREATININE 0.60 0.57 0.49 0.52 0.57  CALCIUM 9.1 9.1 8.7* 8.6* 9.4  MG  --   --  2.1 2.1 2.0   Liver Function Tests: Recent Labs  Lab 01/18/21 1400  AST 17  ALT 14  ALKPHOS 105  BILITOT 0.3  PROT 7.3  ALBUMIN 3.3*   No results for input(s): LIPASE, AMYLASE in the last 168 hours. No results for input(s): AMMONIA in the last 168 hours. CBC: Recent Labs  Lab 01/18/21 1400 01/19/21 0406 01/20/21 0412 01/21/21 0410 01/22/21 0414  WBC 6.1 6.0 6.6 6.0 8.4  NEUTROABS 3.9  --  3.6 3.3 4.9  HGB 10.9* 9.4* 11.3* 10.5* 11.6*  HCT 34.0* 30.1* 35.7* 33.1* 37.1  MCV 86.3 88.8 87.1 86.9 87.9  PLT 531* 420* 467* 429* 508*   Cardiac Enzymes: No results for input(s): CKTOTAL, CKMB, CKMBINDEX, TROPONINI in the last 168  hours. BNP: Invalid input(s): POCBNP CBG: Recent Labs  Lab 01/21/21 1256 01/21/21 1623 01/21/21 2110 01/22/21 0833 01/22/21 1135  GLUCAP 113* 98 110* 101* 170*   D-Dimer No results for input(s): DDIMER in the last 72 hours. Hgb A1c No results for input(s): HGBA1C in the last 72 hours. Lipid Profile No results for input(s): CHOL, HDL, LDLCALC, TRIG, CHOLHDL, LDLDIRECT in the last 72 hours. Thyroid function studies No results for input(s): TSH, T4TOTAL, T3FREE, THYROIDAB in the last 72 hours.  Invalid input(s): FREET3 Anemia work up No results for input(s): VITAMINB12, FOLATE, FERRITIN, TIBC, IRON, RETICCTPCT in the last 72 hours. Urinalysis No results found for: COLORURINE, APPEARANCEUR, [place], [place], [place], [place], [place], [place],  PROTEINUR, UROBILINOGEN, NITRITE, LEUKOCYTESUR Sepsis Labs Invalid input(s): PROCALCITONIN,  WBC,  LACTICIDVEN Microbiology Recent Results (from the past 240 hour(s))  Resp Panel by RT-PCR (Flu A&B, Covid) Nasopharyngeal Swab     Status: None   Collection Time: 01/18/21  2:10 PM   Specimen: Nasopharyngeal Swab; Nasopharyngeal(NP) swabs in vial transport medium  Result Value Ref Range Status   SARS Coronavirus 2 by RT PCR NEGATIVE NEGATIVE Final    Comment: (NOTE) SARS-CoV-2 target nucleic acids are NOT DETECTED.  The SARS-CoV-2 RNA is generally detectable in upper respiratory specimens during the acute phase of infection. The lowest concentration of SARS-CoV-2 viral copies this assay can detect is 138 copies/mL. A negative result does not preclude SARS-Cov-2 infection and should not be used as the sole basis for treatment or other patient management decisions. A negative result may occur with  improper specimen collection/handling, submission of specimen other than nasopharyngeal swab, presence of viral mutation(s) within the areas targeted by this assay, and inadequate number of viral copies(<138 copies/mL). A negative result  must be combined with clinical observations, patient history, and epidemiological information. The expected result is Negative.  Fact Sheet for Patients:  EntrepreneurPulse.com.au  Fact Sheet for Healthcare Providers:  IncredibleEmployment.be  This test is no t yet approved or cleared by the [place] FDA and  has been authorized for detection and/or diagnosis of SARS-CoV-2 by FDA under an Emergency Use Authorization (EUA). This EUA will remain  in effect (meaning this test can be used) for the duration of the COVID-19 declaration under Section 564(b)(1) of the Act, 21 U.S.C.section 360bbb-3(b)(1), unless the authorization is terminated  or revoked sooner.       Influenza A by PCR NEGATIVE NEGATIVE Final   Influenza B by PCR NEGATIVE NEGATIVE Final    Comment: (NOTE) The Xpert Xpress SARS-CoV-2/FLU/RSV plus assay is intended as an aid in the diagnosis of influenza from Nasopharyngeal swab specimens and should not be used as a sole basis for treatment. Nasal washings and aspirates are unacceptable for Xpert Xpress SARS-CoV-2/FLU/RSV testing.  Fact Sheet for Patients: EntrepreneurPulse.com.au  Fact Sheet for Healthcare Providers: IncredibleEmployment.be  This test is not yet approved or cleared by the [place] FDA and has been authorized for detection and/or diagnosis of SARS-CoV-2 by FDA under an Emergency Use Authorization (EUA). This EUA will remain in effect (meaning this test can be used) for the duration of the COVID-19 declaration under Section 564(b)(1) of the Act, 21 U.S.C. section 360bbb-3(b)(1), unless the authorization is terminated or revoked.  Performed at [facility], [address] [place], [place] [address]   Culture, Respiratory w Gram Stain     Status: None   Collection Time: 01/20/21 11:20 AM   Specimen: Bronchoalveolar Lavage; Respiratory  Result Value Ref  Range Status   Specimen Description   Final    BRONCHIAL ALVEOLAR LAVAGE LLL Performed at [place] [address], [place], [place] [address]    Special Requests   Final    NONE Performed at [facility], [place] [address], [place], [place] [address]    Gram Stain   Final    RARE WBC PRESENT,BOTH PMN AND MONONUCLEAR NO ORGANISMS SEEN    Culture   Final    FEW GROUP B STREP(S.AGALACTIAE)ISOLATED TESTING AGAINST S. AGALACTIAE NOT ROUTINELY PERFORMED DUE TO PREDICTABILITY OF AMP/PEN/VAN SUSCEPTIBILITY. Performed at [facility], [place] [address], [place], [place] [address]    Report Status 01/22/2021 FINAL  Final     Time coordinating discharge: 39 minutes  SIGNED:   Georgette Shell, MD  [facility] 01/22/2021, 4:11 PM

## 2021-01-22 NOTE — Telephone Encounter (Signed)
D/w Dr Kathreen Cosier of [facility]   = patient refused pleurx - gooing home today - Dr Rodena Piety is concerned that effusion will come back and patient will be readdmitted soon  - CCM can sign off and need not round 01/22/2021 - OPD followup at [facility] given to patient   02/08/21 Monday at [facility] will be done with prior      SIGNATURE    Dr. Brand Males, M.D., F.C.C.P,  Pulmonary and Critical Care Medicine Staff Physician, [place] Director - Interstitial Lung Disease  Program  Pulmonary [place] [facility] [place], [place], [address]  NPI Number:  NPI #[id number]  Pager: [phone], If no answer  -> Check AMION or Try [phone] Telephone (clinical office): [phone] Telephone (research): [phone]  11:32 AM 01/22/2021

## 2021-01-25 ENCOUNTER — Other Ambulatory Visit: Payer: Self-pay | Admitting: *Deleted

## 2021-01-25 ENCOUNTER — Encounter: Payer: Self-pay | Admitting: *Deleted

## 2021-01-25 ENCOUNTER — Telehealth: Payer: Self-pay

## 2021-01-25 MED ORDER — DRONABINOL 5 MG PO CAPS: 5.0000 mg | ORAL_CAPSULE | Freq: Two times a day (BID) | ORAL | 2 refills | Status: DC

## 2021-01-25 NOTE — Progress Notes (Signed)
Patient missed her follow up appointment last week due to hospitalization. Spoke to Dr Marin Olp and he would like patient to seen in the office this Thursday. Message sent to scheduling.   Oncology Nurse Navigator Documentation  Oncology Nurse Navigator Flowsheets 01/25/2021  Abnormal Finding Date -  Confirmed Diagnosis Date -  Diagnosis Status -  Navigator Follow Up Date: 01/28/2021  Navigator Follow Up Reason: Follow-up Appointment  Navigation Complete Date: -  Post Navigation: Continue to Follow Patient? -  Reason Not Navigating Patient: -  Navigator Location [facility]  Referral Date to RadOnc/MedOnc -  Navigator Encounter Type Appt/Treatment Plan Review  Telephone -  [facility] Date -  Multidisiplinary Clinic Type -  Patient Visit Type MedOnc  Treatment Phase Active Tx  Barriers/Navigation Needs Coordination of Care;Education  Education -  Interventions Coordination of Care  Acuity Level 2-Minimal Needs (1-2 Barriers Identified)  Coordination of Care Appts  Education Method -  Support Groups/Services Friends and Family  Time Spent with Patient 15

## 2021-01-26 ENCOUNTER — Other Ambulatory Visit: Payer: Self-pay | Admitting: Hematology & Oncology

## 2021-01-28 ENCOUNTER — Other Ambulatory Visit: Payer: Self-pay

## 2021-01-28 ENCOUNTER — Encounter: Payer: Self-pay | Admitting: *Deleted

## 2021-01-28 ENCOUNTER — Emergency Department (HOSPITAL_COMMUNITY): Payer: Medicare Other

## 2021-01-28 ENCOUNTER — Observation Stay (HOSPITAL_COMMUNITY): Payer: Medicare Other

## 2021-01-28 ENCOUNTER — Inpatient Hospital Stay: Payer: Medicare Other | Admitting: Hematology & Oncology

## 2021-01-28 ENCOUNTER — Encounter (HOSPITAL_COMMUNITY): Payer: Self-pay

## 2021-01-28 ENCOUNTER — Inpatient Hospital Stay (HOSPITAL_COMMUNITY)
Admission: EM | Admit: 2021-01-28 | Discharge: 2021-02-05 | DRG: 180 | Disposition: A | Discharge status: Home / Self Care | Payer: Medicare Other | Attending: Internal Medicine | Admitting: Internal Medicine

## 2021-01-28 ENCOUNTER — Telehealth: Payer: Self-pay

## 2021-01-28 ENCOUNTER — Inpatient Hospital Stay: Payer: Medicare Other

## 2021-01-28 DIAGNOSIS — D638 Anemia in other chronic diseases classified elsewhere: Secondary | ICD-10-CM | POA: Diagnosis present

## 2021-01-28 DIAGNOSIS — R55 Syncope and collapse: Secondary | ICD-10-CM | POA: Diagnosis not present

## 2021-01-28 DIAGNOSIS — C3492 Malignant neoplasm of unspecified part of left bronchus or lung: Secondary | ICD-10-CM

## 2021-01-28 DIAGNOSIS — G9340 Encephalopathy, unspecified: Secondary | ICD-10-CM | POA: Diagnosis present

## 2021-01-28 DIAGNOSIS — Z85118 Personal history of other malignant neoplasm of bronchus and lung: Secondary | ICD-10-CM

## 2021-01-28 DIAGNOSIS — Z4682 Encounter for fitting and adjustment of non-vascular catheter: Secondary | ICD-10-CM

## 2021-01-28 DIAGNOSIS — Z853 Personal history of malignant neoplasm of breast: Secondary | ICD-10-CM

## 2021-01-28 DIAGNOSIS — C7802 Secondary malignant neoplasm of left lung: Secondary | ICD-10-CM | POA: Diagnosis not present

## 2021-01-28 DIAGNOSIS — C50512 Malignant neoplasm of lower-outer quadrant of left female breast: Secondary | ICD-10-CM

## 2021-01-28 DIAGNOSIS — Z9114 Patient's other noncompliance with medication regimen: Secondary | ICD-10-CM

## 2021-01-28 DIAGNOSIS — J90 Pleural effusion, not elsewhere classified: Secondary | ICD-10-CM

## 2021-01-28 DIAGNOSIS — J910 Malignant pleural effusion: Secondary | ICD-10-CM | POA: Diagnosis present

## 2021-01-28 DIAGNOSIS — J948 Other specified pleural conditions: Secondary | ICD-10-CM

## 2021-01-28 DIAGNOSIS — E039 Hypothyroidism, unspecified: Secondary | ICD-10-CM | POA: Diagnosis present

## 2021-01-28 DIAGNOSIS — Z170 Estrogen receptor positive status [ER+]: Secondary | ICD-10-CM

## 2021-01-28 DIAGNOSIS — Z7984 Long term (current) use of oral hypoglycemic drugs: Secondary | ICD-10-CM

## 2021-01-28 DIAGNOSIS — R0602 Shortness of breath: Secondary | ICD-10-CM

## 2021-01-28 DIAGNOSIS — Z20822 Contact with and (suspected) exposure to covid-19: Secondary | ICD-10-CM | POA: Diagnosis present

## 2021-01-28 DIAGNOSIS — E222 Syndrome of inappropriate secretion of antidiuretic hormone: Secondary | ICD-10-CM | POA: Diagnosis present

## 2021-01-28 DIAGNOSIS — E7800 Pure hypercholesterolemia, unspecified: Secondary | ICD-10-CM | POA: Diagnosis present

## 2021-01-28 DIAGNOSIS — G936 Cerebral edema: Secondary | ICD-10-CM | POA: Diagnosis present

## 2021-01-28 DIAGNOSIS — Z9689 Presence of other specified functional implants: Secondary | ICD-10-CM

## 2021-01-28 DIAGNOSIS — E119 Type 2 diabetes mellitus without complications: Secondary | ICD-10-CM | POA: Diagnosis present

## 2021-01-28 DIAGNOSIS — Z79899 Other long term (current) drug therapy: Secondary | ICD-10-CM

## 2021-01-28 DIAGNOSIS — R918 Other nonspecific abnormal finding of lung field: Secondary | ICD-10-CM

## 2021-01-28 DIAGNOSIS — Z803 Family history of malignant neoplasm of breast: Secondary | ICD-10-CM

## 2021-01-28 DIAGNOSIS — C7951 Secondary malignant neoplasm of bone: Secondary | ICD-10-CM | POA: Diagnosis present

## 2021-01-28 DIAGNOSIS — C799 Secondary malignant neoplasm of unspecified site: Secondary | ICD-10-CM

## 2021-01-28 DIAGNOSIS — C7931 Secondary malignant neoplasm of brain: Secondary | ICD-10-CM | POA: Diagnosis present

## 2021-01-28 DIAGNOSIS — C50912 Malignant neoplasm of unspecified site of left female breast: Secondary | ICD-10-CM | POA: Diagnosis present

## 2021-01-28 DIAGNOSIS — Z79811 Long term (current) use of aromatase inhibitors: Secondary | ICD-10-CM

## 2021-01-28 LAB — COMPREHENSIVE METABOLIC PANEL
ALT: 9 U/L (ref 0–44)
AST: 18 U/L (ref 15–41)
Albumin: 2.5 g/dL — ABNORMAL LOW (ref 3.5–5.0)
Alkaline Phosphatase: 79 U/L (ref 38–126)
Anion gap: 7 (ref 5–15)
BUN: 22 mg/dL (ref 8–23)
CO2: 26 mmol/L (ref 22–32)
Calcium: 8 mg/dL — ABNORMAL LOW (ref 8.9–10.3)
Chloride: 100 mmol/L (ref 98–111)
Creatinine, Ser: 0.64 mg/dL (ref 0.44–1.00)
GFR, Estimated: >60 mL/min (ref >60)
Glucose, Bld: 143 mg/dL — ABNORMAL HIGH (ref 70–99)
Potassium: 3.8 mmol/L (ref 3.5–5.1)
Sodium: 133 mmol/L — ABNORMAL LOW (ref 135–145)
Total Bilirubin: 0.5 mg/dL (ref 0.3–1.2)
Total Protein: 5.9 g/dL — ABNORMAL LOW (ref 6.5–8.1)

## 2021-01-28 LAB — CBC WITH DIFFERENTIAL/PLATELET
Abs Immature Granulocytes: 0.04 10*3/uL (ref 0.00–0.07)
Basophils Absolute: 0 10*3/uL (ref 0.0–0.1)
Basophils Relative: 0 %
Eosinophils Absolute: 0 10*3/uL (ref 0.0–0.5)
Eosinophils Relative: 1 %
HCT: 32.6 % — ABNORMAL LOW (ref 36.0–46.0)
Hemoglobin: 10.4 g/dL — ABNORMAL LOW (ref 12.0–15.0)
Immature Granulocytes: 1 %
Lymphocytes Relative: 8 %
Lymphs Abs: 0.7 10*3/uL (ref 0.7–4.0)
MCH: 27.7 pg (ref 26.0–34.0)
MCHC: 31.9 g/dL (ref 30.0–36.0)
MCV: 86.9 fL (ref 80.0–100.0)
Monocytes Absolute: 0.3 10*3/uL (ref 0.1–1.0)
Monocytes Relative: 4 %
Neutro Abs: 7.5 10*3/uL (ref 1.7–7.7)
Neutrophils Relative %: 86 %
Platelets: 329 10*3/uL (ref 150–400)
RBC: 3.75 MIL/uL — ABNORMAL LOW (ref 3.87–5.11)
RDW: 15.7 % — ABNORMAL HIGH (ref 11.5–15.5)
WBC: 8.6 10*3/uL (ref 4.0–10.5)
nRBC: 0 % (ref 0.0–0.2)

## 2021-01-28 LAB — RESP PANEL BY RT-PCR (FLU A&B, COVID) ARPGX2
Influenza A by PCR: NEGATIVE
Influenza B by PCR: NEGATIVE
SARS Coronavirus 2 by RT PCR: NEGATIVE

## 2021-01-28 LAB — TROPONIN I (HIGH SENSITIVITY)
Troponin I (High Sensitivity): 12 ng/L (ref <18)
Troponin I (High Sensitivity): 44 ng/L — ABNORMAL HIGH (ref <18)

## 2021-01-28 IMAGING — DX DG CHEST 1V PORT
1 series · 1 of 1 positions shown · non-contrast
Comparison: Film from earlier in the same day.

CLINICAL DATA: Status post chest tube placement

EXAM:
PORTABLE CHEST 1 VIEW

[chest ap]
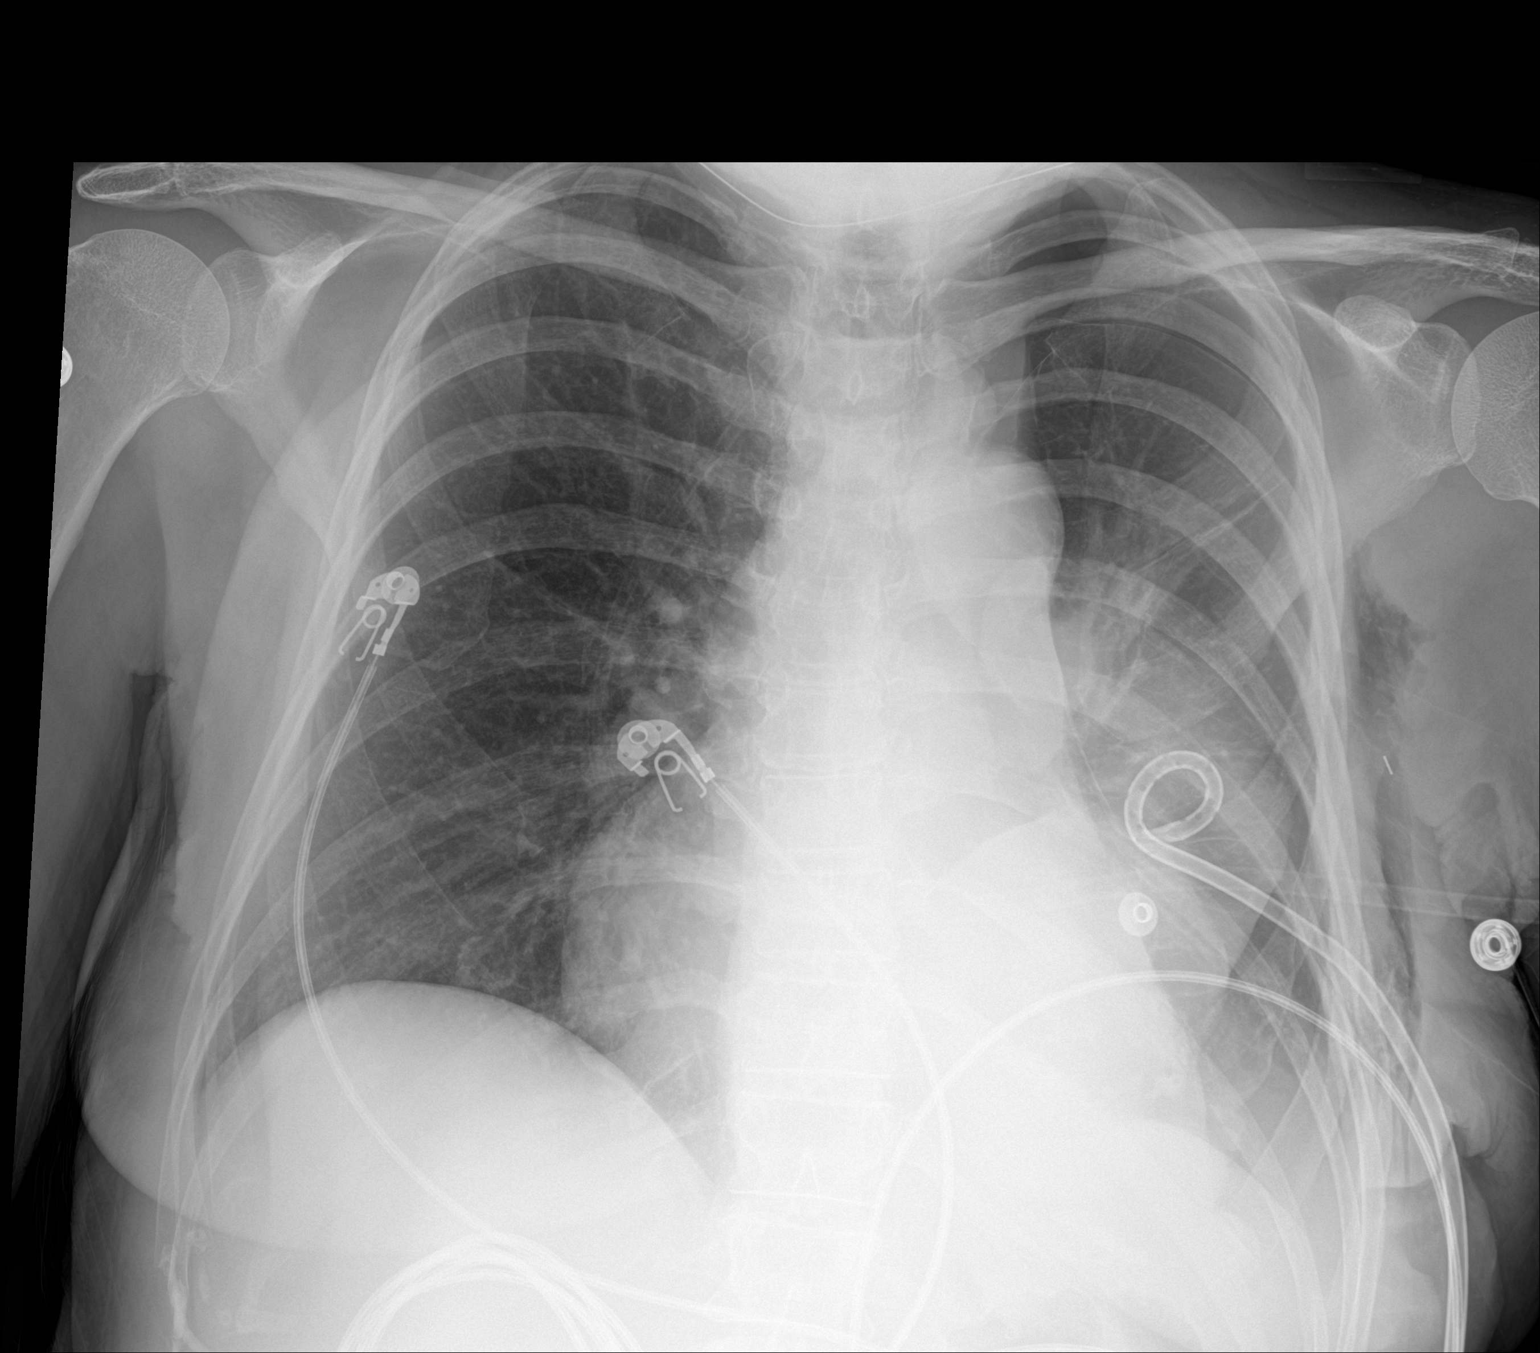

[1 of 1 positions shown; findings below may reference images not displayed]

FINDINGS: Pigtail catheter is now seen on the left with significant reduction
in pleural effusion. The lung is incompletely re-expanded with both
an apical and inferolateral pneumothorax component. These changes
are likely related to incomplete re-expansion of the lung as opposed
to a true pneumothorax. This is related to the longstanding pleural
effusion. Right lung is clear. Cardiac shadow is within normal
limits.
IMPRESSION: Status post chest tube placement with significant reduction in
left-sided pleural effusion. There remains left lung consolidation
and incomplete re-expansion as described related to the longstanding
nature of the effusion as opposed to a true pneumothorax. Follow-up
films are recommended.

## 2021-01-28 IMAGING — MR MR HEAD WO/W CM
15 series · 48 of 48 positions shown · IV contrast (gadavist)
Comparison: Head CT [DATE].

CLINICAL DATA: Seizure, nontraumatic (Age >= 41y).

EXAM:
MRI HEAD WITHOUT AND WITH CONTRAST
TECHNIQUE: Multiplanar, multiecho pulse sequences of the brain and surrounding
structures were obtained without and with intravenous contrast.
CONTRAST:  6mL GADAVIST GADOBUTROL 1 MMOL/ML IV SOLN

[Series 5: DWI · axial · 3.0mm · 1.36mm/px · z∈[-69,+71]mm · 5 of 96 slices shown (1 of 2)]
[im 1/96]
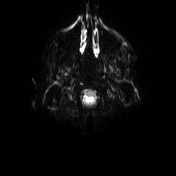
[im 24/96]
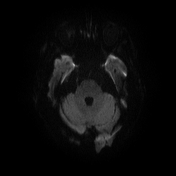
[im 48/96]
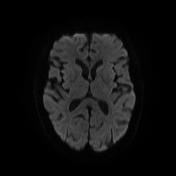
[im 72/96]
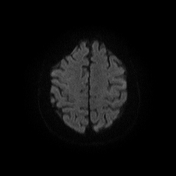
[im 96/96]
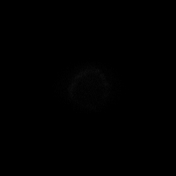

[Series 6: DWI · axial · 3.0mm · 1.36mm/px · z∈[-69,+71]mm · 3 of 48 slices shown (2 of 2)]
[im 1/48]
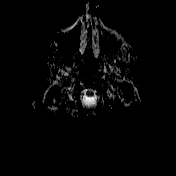
[im 24/48]
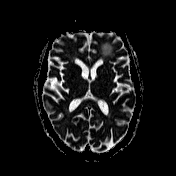
[im 48/48]
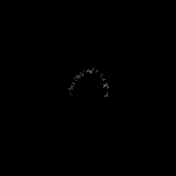

[Series 7: T1 · sagittal · 5.0mm · 0.75mm/px · 1 of 24 slices shown (1 of 4)]
[im 1/24]
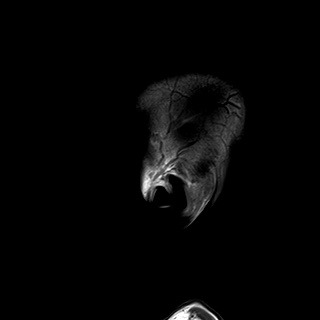

[Series 8: T2 · axial · 5.0mm · 0.75mm/px · 1 of 22 slices shown]
[im 1/22]
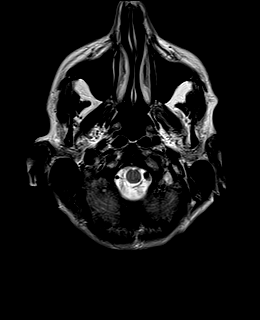

[Series 9: swi_images · axial · 3.0mm · 0.75mm/px · z∈[-83,+82]mm · 3 of 56 slices shown]
[im 1/56]
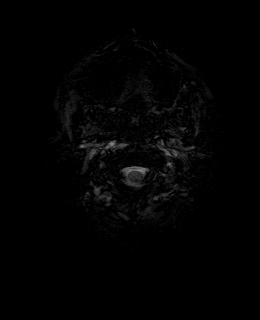
[im 28/56]
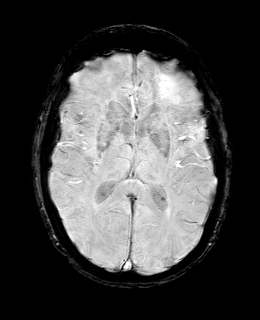
[im 56/56]
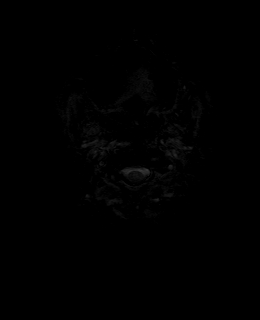

[Series 11: FLAIR · axial · 3.0mm · 0.75mm/px · z∈[-74,+73]mm · 3 of 50 slices shown]
[im 1/50]
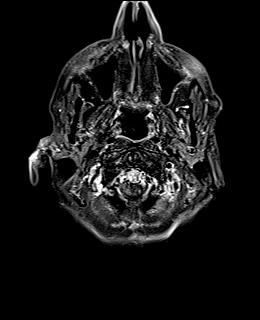
[im 25/50]
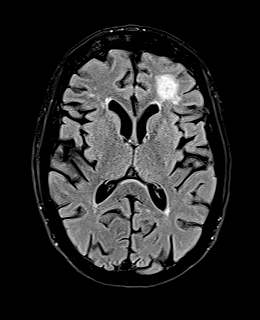
[im 50/50]
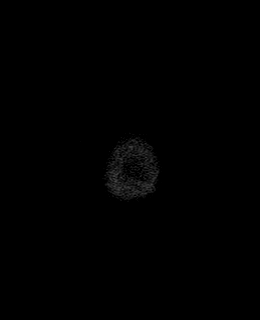

[Series 12: T1 · axial · 1.0mm · 0.94mm/px · z∈[-72,+71]mm · 9 of 144 slices shown (2 of 4)]
[im 1/144]
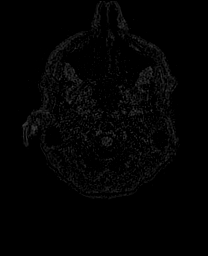
[im 18/144]
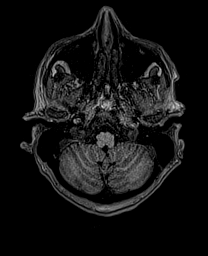
[im 36/144]
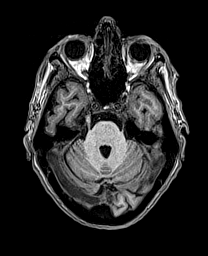
[im 54/144]
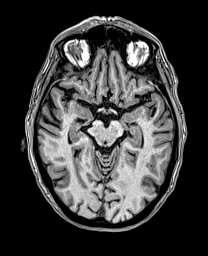
[im 72/144]
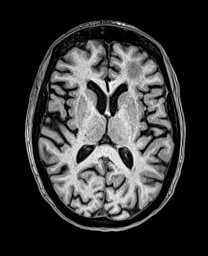
[im 90/144]
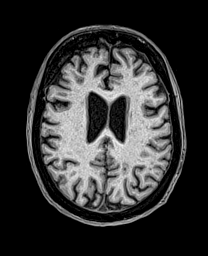
[im 108/144]
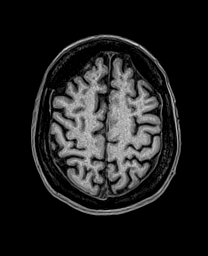
[im 126/144]
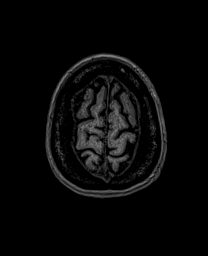
[im 144/144]
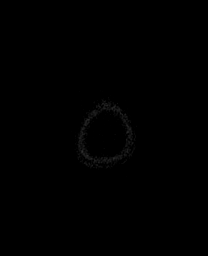

[Series 13: cor dwi_tracew · coronal · 5.0mm · 1.53mm/px · 3 of 56 slices shown]
[im 1/56]
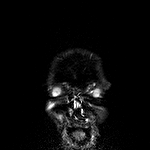
[im 28/56]
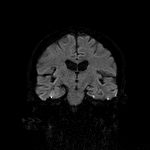
[im 56/56]
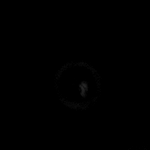

[Series 14: cor dwi_adc · coronal · 5.0mm · 1.53mm/px · 2 of 28 slices shown]
[im 1/28]
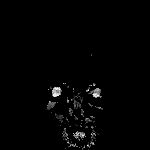
[im 28/28]
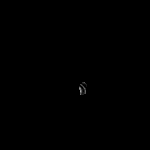

[Series 15: T2 post-contrast · coronal · 5.0mm · 0.69mm/px · 2 of 28 slices shown]
[im 1/28]
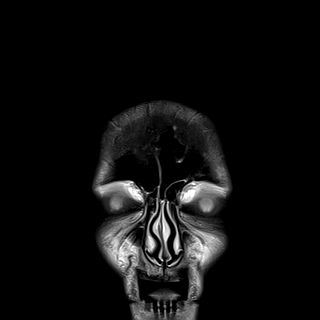
[im 28/28]
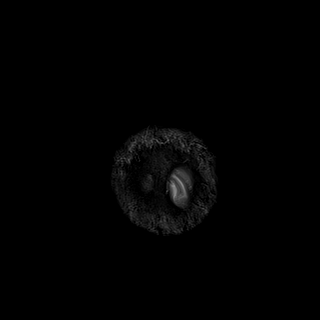

[Series 16: T1 post-contrast · axial · 1.0mm · 0.94mm/px · z∈[-72,+71]mm · 9 of 144 slices shown (1 of 3)]
[im 1/144]
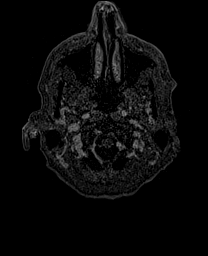
[im 18/144]
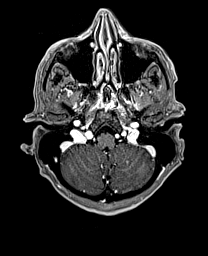
[im 36/144]
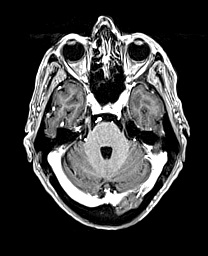
[im 54/144]
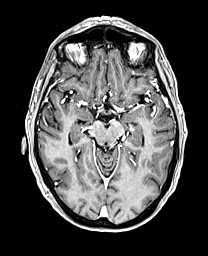
[im 72/144]
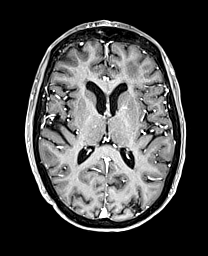
[im 90/144]
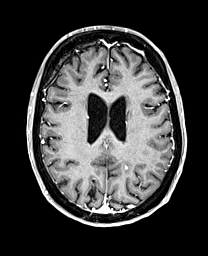
[im 108/144]
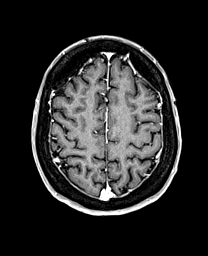
[im 126/144]
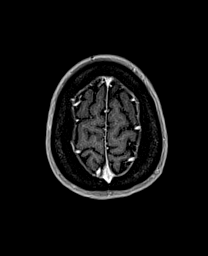
[im 144/144]
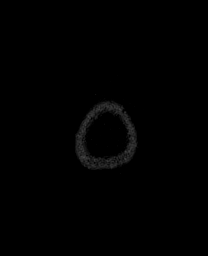

[Series 17: T1 · sagittal · 4.0mm · 0.94mm/px · 2 of 30 slices shown (3 of 4)]
[im 1/30]
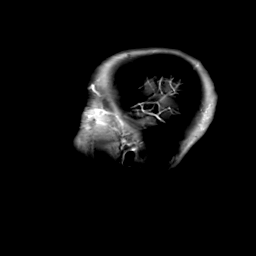
[im 30/30]
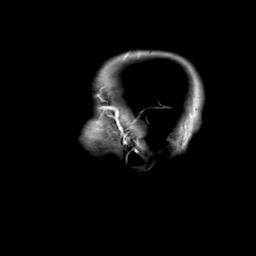

[Series 18: T1 · coronal · 4.0mm · 0.94mm/px · 2 of 30 slices shown (4 of 4)]
[im 1/30]
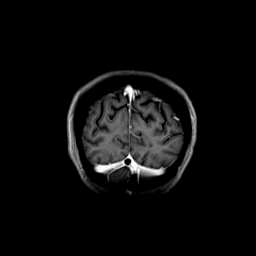
[im 30/30]
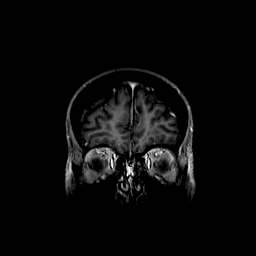

[Series 19: T1 post-contrast · coronal · 5.0mm · 0.43mm/px · 2 of 28 slices shown (2 of 3)]
[im 1/28]
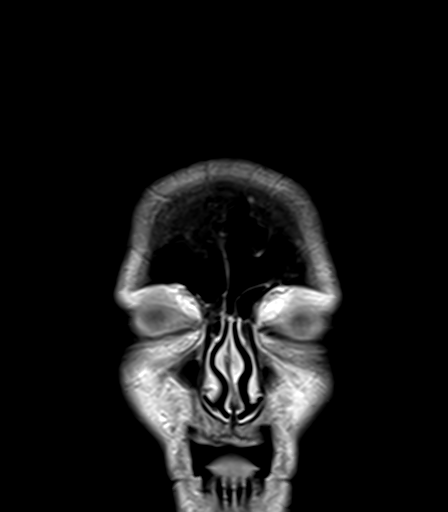
[im 28/28]
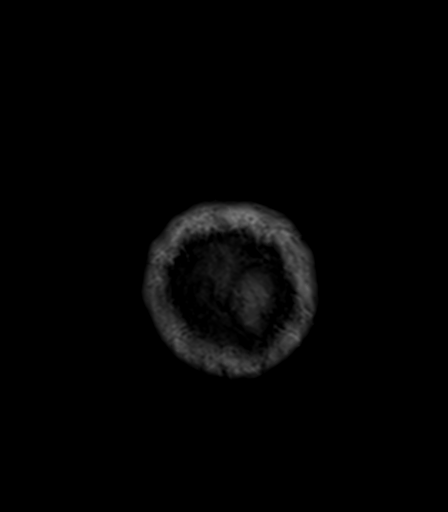

[Series 20: T1 post-contrast · sagittal · 5.0mm · 0.75mm/px · 1 of 24 slices shown (3 of 3)]
[im 1/24]
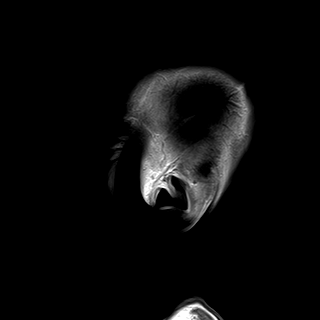

[48 of 48 positions shown; findings below may reference images not displayed]

FINDINGS: Brain:

Cerebral volume is normal.

There are multiple enhancing intracranial lesions compatible with
intracranial metastatic disease, as follows.

4 mm enhancing lesion within the right frontal lobe with mild
surrounding edema (series 16, image 96).

3 mm enhancing lesion within the posterior left frontal lobe with
mild surrounding edema (series 16, image 98).

6 mm enhancing lesion within the anterior left frontal lobe with
mild surrounding edema (series 16, image 67).

2 mm enhancing lesion within the left parietal lobe with mild
surrounding edema (series 16, image 91).

2 mm enhancing lesion within the right cerebellar hemisphere (series
16, image 29).

2 mm enhancing lesion within the left cerebellar hemisphere (series
16, image 18).

Mild multifocal T2/FLAIR hyperintensity elsewhere within the
cerebral white matter and pons is nonspecific, but compatible
chronic small vessel ischemic disease.

There is a punctate focus of apparent diffusion weighted
hyperintensity within the right cerebellum appreciated on the axial
diffusion-weighted imaging only (series 5, image 55).

No chronic intracranial blood products.

No extra-axial fluid collection.

No midline shift.

Vascular: Maintained flow voids within the proximal large arterial
vessels.

Skull and upper cervical spine: No focal suspicious marrow lesion.

Sinuses/Orbits: Visualized orbits show no acute finding. Trace
bilateral ethmoid sinus mucosal thickening.
IMPRESSION: At least six enhancing metastatic lesions are identified within the
supratentorial and infratentorial brain, measuring up to 4 mm, some
with mild surrounding edema, as detailed.

Punctate focus of diffusion weighted signal abnormality within the
medial right cerebellum, appreciated on the axial diffusion-weighted
sequence only. This may reflect an acute infarct, a poorly enhancing
metastasis or artifact.

Background mild chronic small vessel ischemic changes within the
cerebral white matter and pons.

## 2021-01-28 IMAGING — CT CT HEAD W/O CM
4 series · 15 of 47 positions shown, 17 images · non-contrast
Comparison: Brain MRI [DATE]

CLINICAL DATA: Seizure

EXAM:
CT HEAD WITHOUT CONTRAST
TECHNIQUE: Contiguous axial images were obtained from the base of the skull
through the vertex without intravenous contrast.

[Series 2: head wo · axial · 0.47mm/px · z∈[+1636,+1746]mm · 7 of 30 slices shown, 9 images]
[im 4/30  brain]
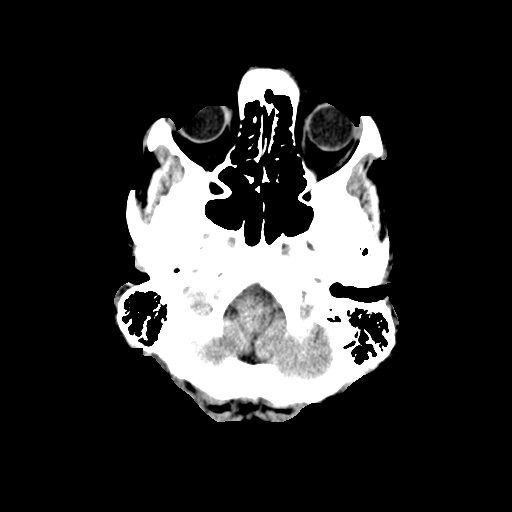
[im 4/30  bone]
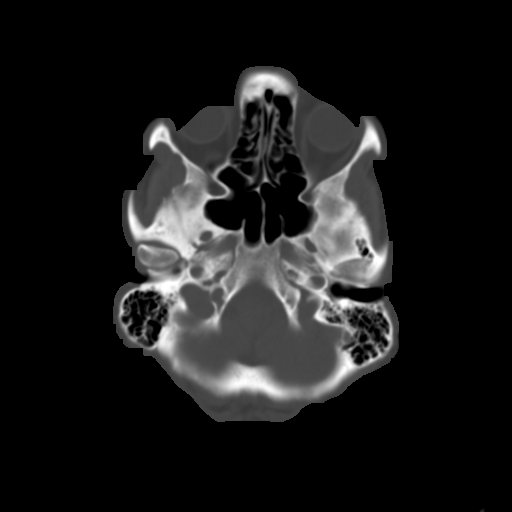
[im 8/30  brain]
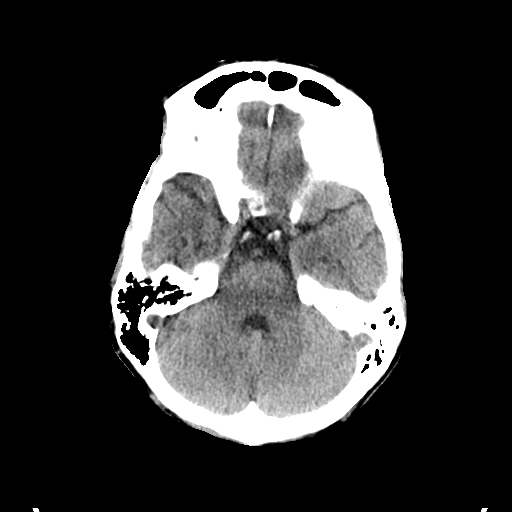
[im 11/30  brain]
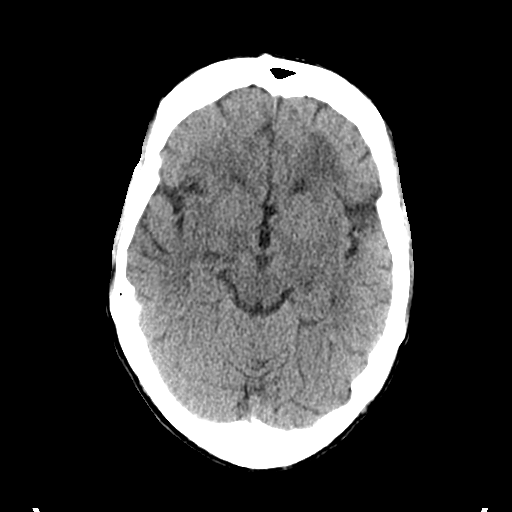
[im 15/30  brain]
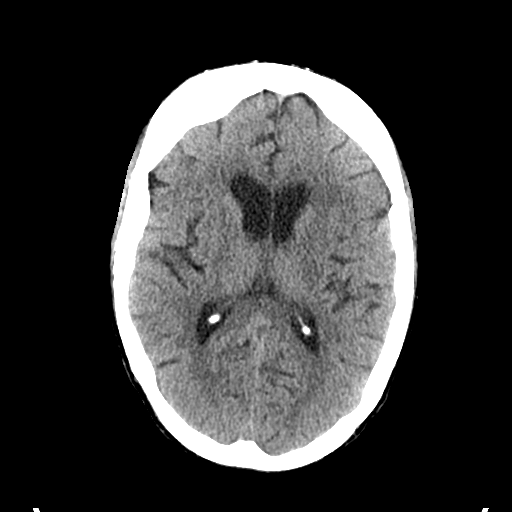
[im 19/30  brain]
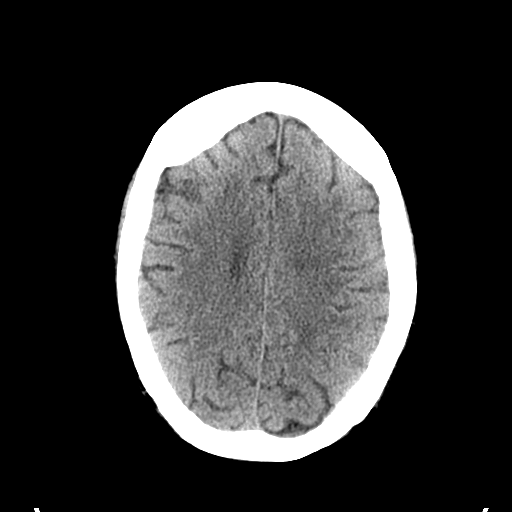
[im 19/30  bone]
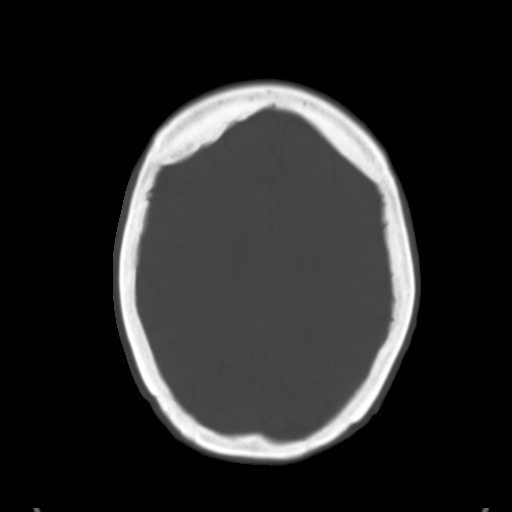
[im 22/30  brain]
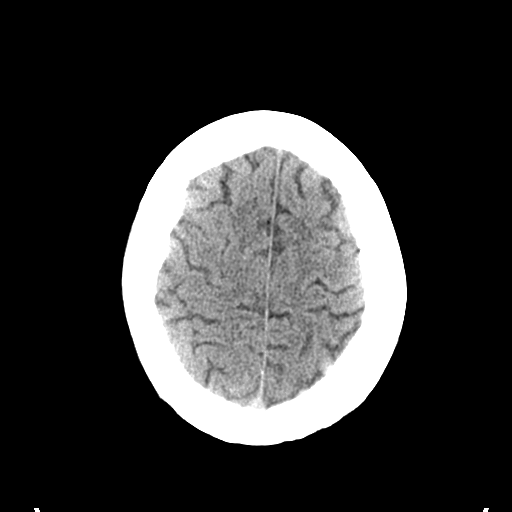
[im 26/30  brain]
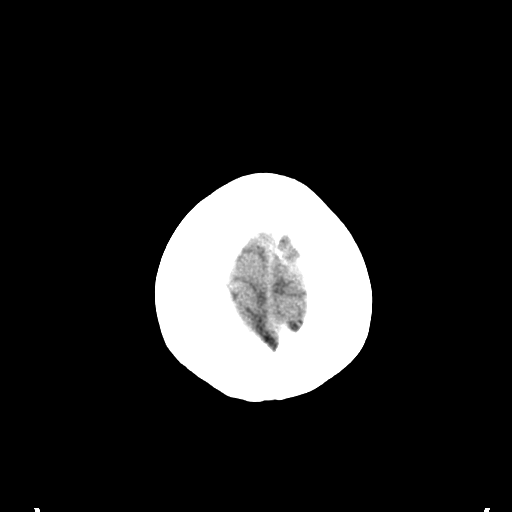

[Series 3: head bone · axial · 0.47mm/px · z∈[+1635,+1649]mm · 2 of 73 slices shown]
[im 8/73  bone]
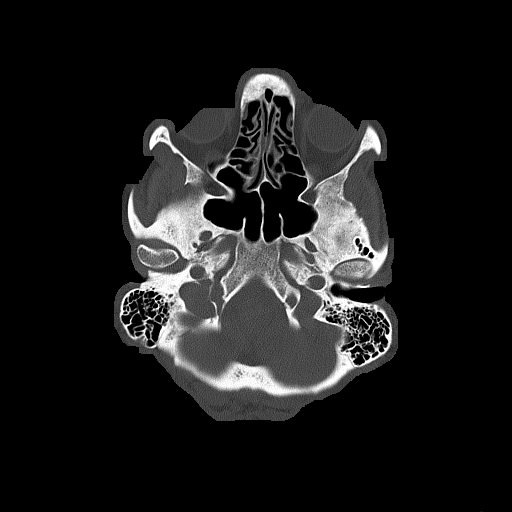
[im 15/73  bone]
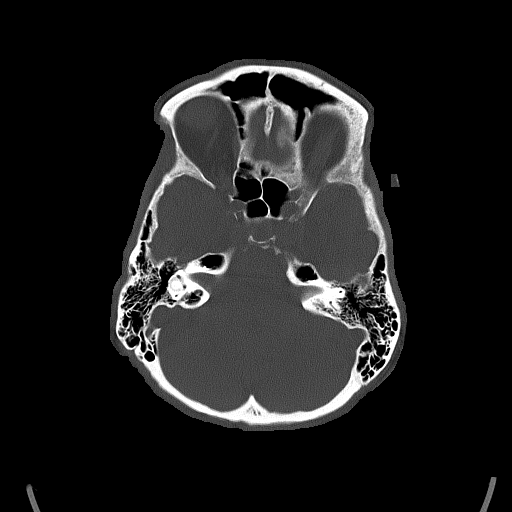

[Series 4: coronal soft tissue · coronal · 0.34mm/px · 3 of 66 slices shown]
[im 22/66  brain]
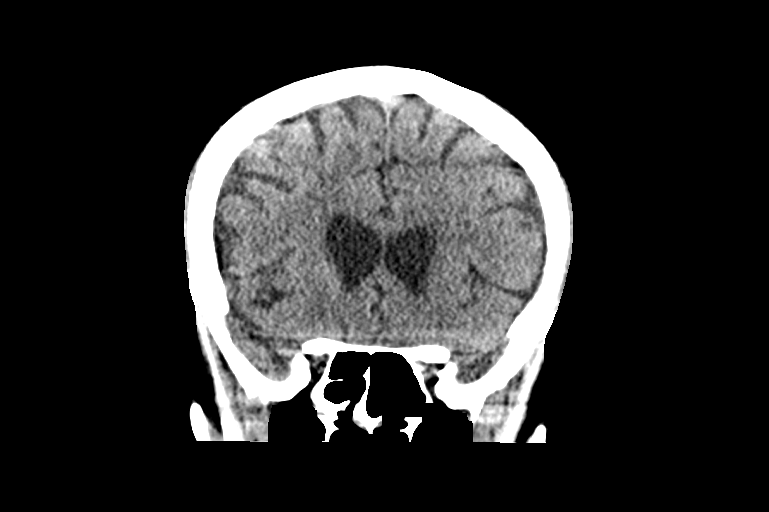
[im 29/66  brain]
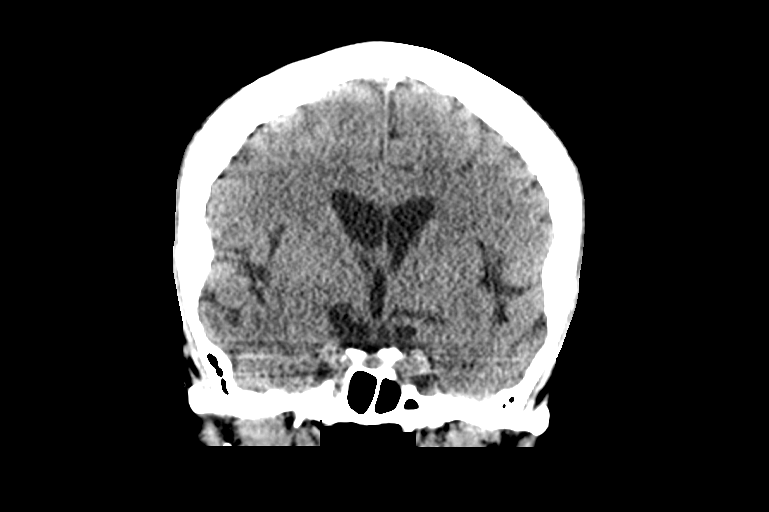
[im 37/66  brain]
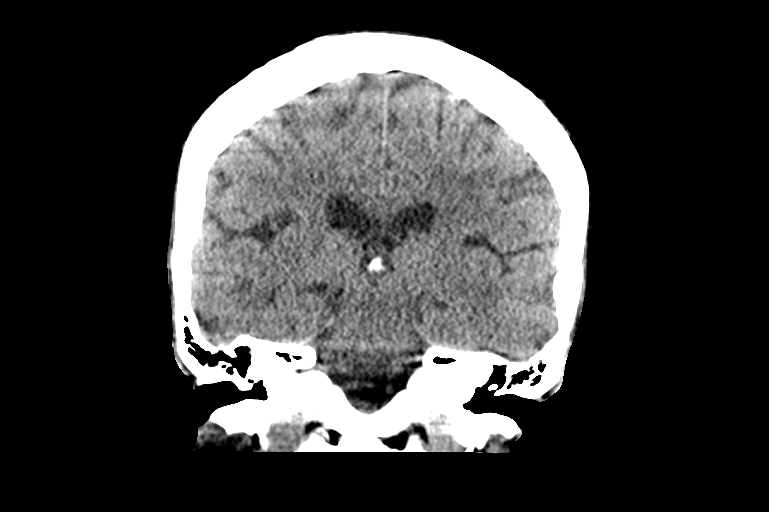

[Series 5: sagittal soft tissue · sagittal · 0.39mm/px · 3 of 49 slices shown]
[im 17/49  brain]
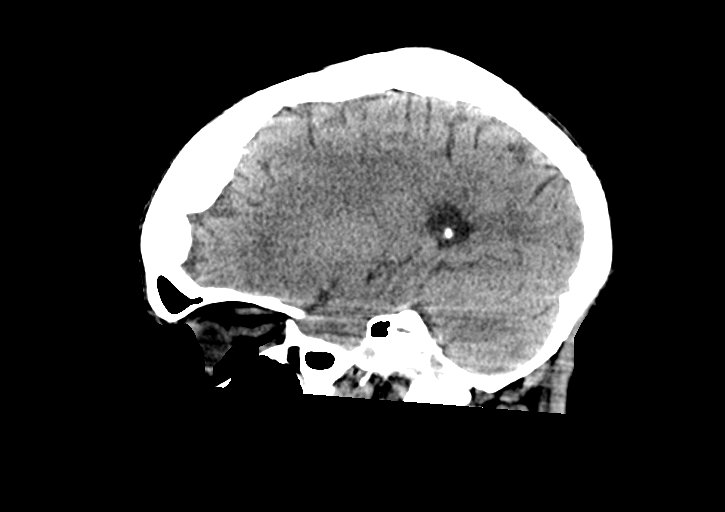
[im 25/49  brain]
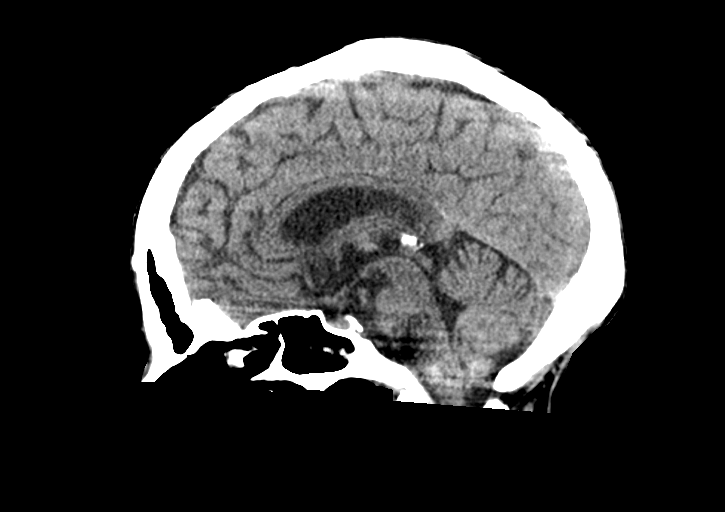
[im 33/49  brain]
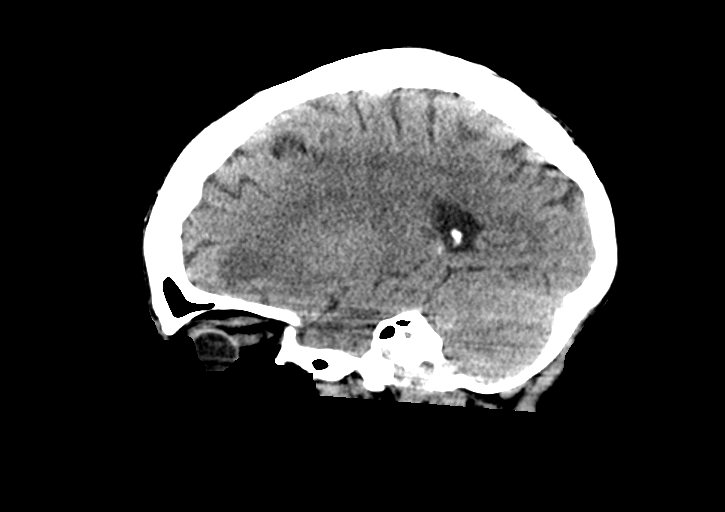

[15 of 47 positions shown; findings below may reference images not displayed]

FINDINGS: Brain: There is ill-defined hypodensity in the left frontal lobe
subcortical white matter along the orbital gyrus (2-10, 4-15, 5-36).
There is no evidence of acute intracranial hemorrhage or territorial
infarct. The ventricles are not enlarged. There is no midline shift.

Vascular: No hyperdense vessel or unexpected calcification.

Skull: Normal. Negative for fracture or focal lesion.

Sinuses/Orbits: The imaged paranasal sinuses are clear. The imaged
globes and orbits are unremarkable.

Other: The mastoid air cells are clear.
IMPRESSION: Ill-defined enhancement in the left frontal lobe as above. Given the
patient's history of small cell lung cancer, brain MRI with and
without contrast is recommended to exclude underlying mass lesion.

## 2021-01-28 IMAGING — DX DG CHEST 1V PORT
1 series · 1 of 1 positions shown · non-contrast
Comparison: [DATE].
COMPARISON: [DATE].

Addendum:
CLINICAL DATA: Syncope.

EXAM:
PORTABLE CHEST 1 VIEW

[chest ap]
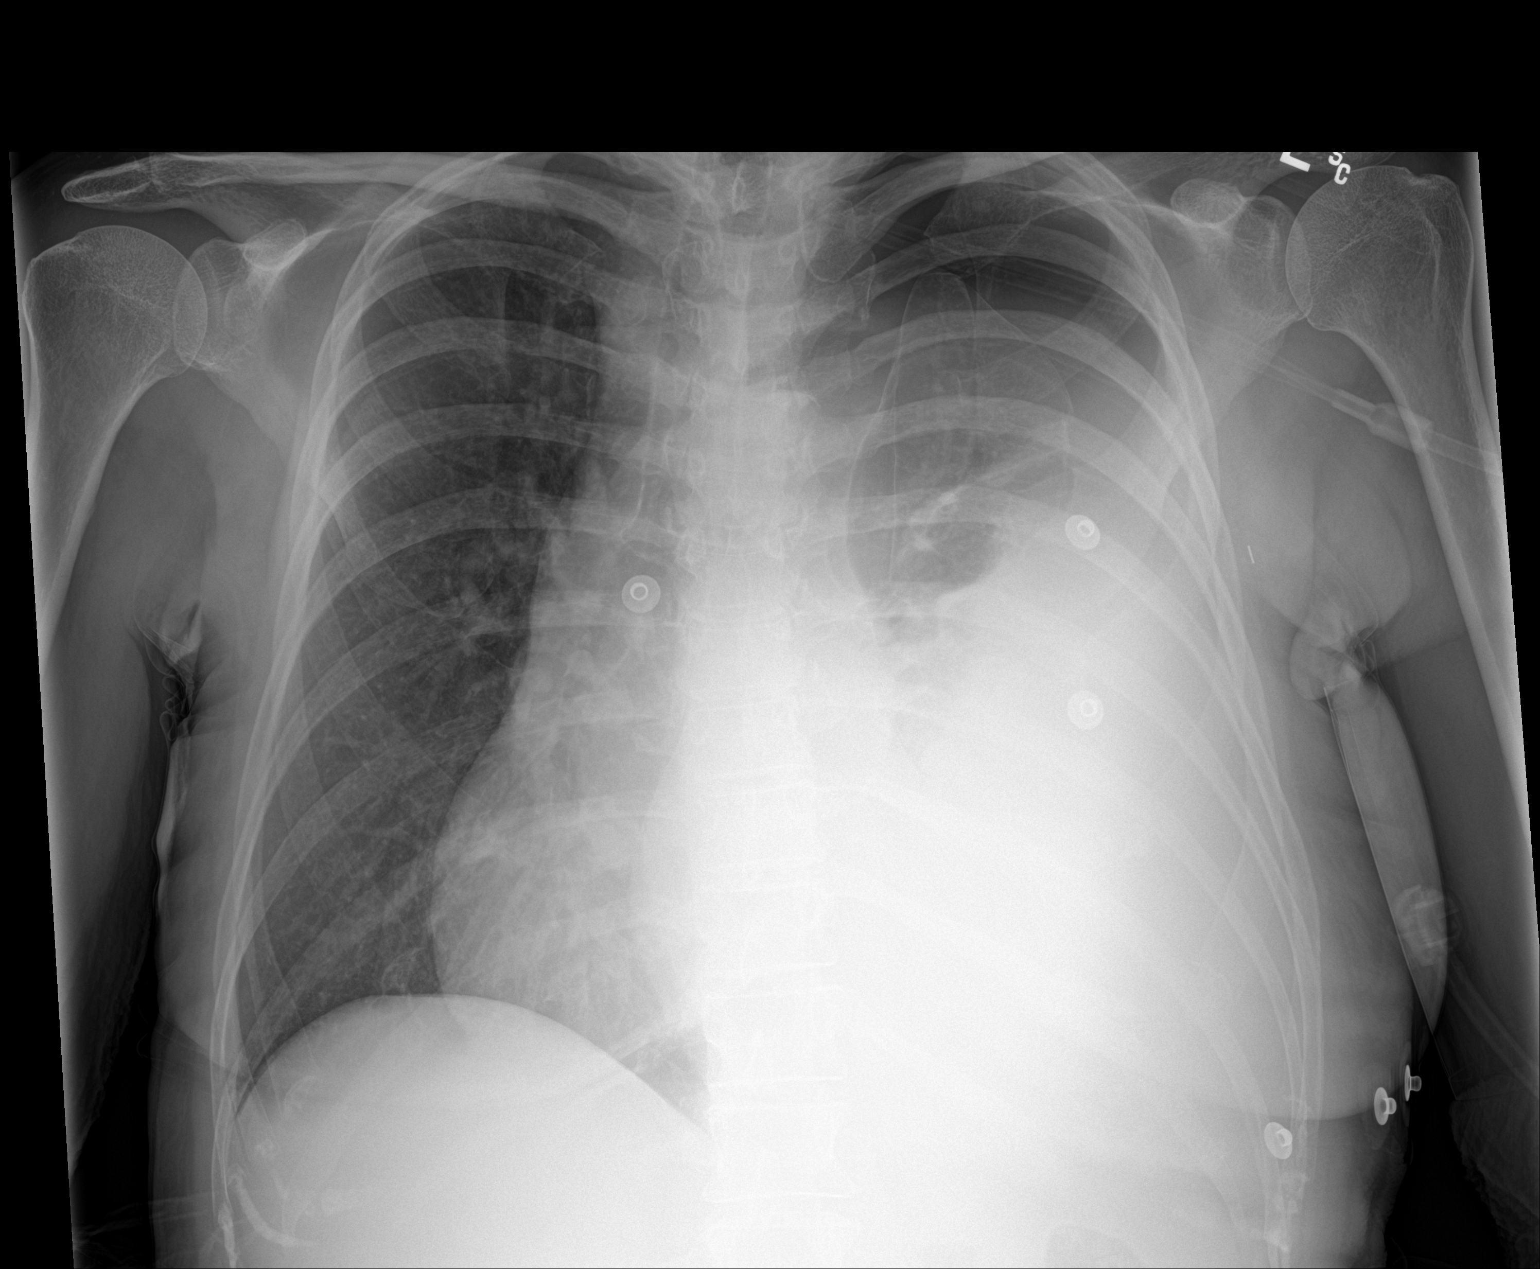

[1 of 1 positions shown; findings below may reference images not displayed]

FINDINGS: Stable cardiomediastinal silhouette. Right lung is clear. Left
apical pneumothorax is noted with large pleural effusion. Bony
thorax is unremarkable.
IMPRESSION: Large left-sided hydropneumothorax is noted.

ADDENDUM:
Critical Value/emergent results were called by telephone at the time
of interpretation on [DATE] at [DATE] to provider SUMITRA
SUMITRA , who verbally acknowledged these results.

*** End of Addendum ***
FINDINGS: Stable cardiomediastinal silhouette. Right lung is clear. Left
apical pneumothorax is noted with large pleural effusion. Bony
thorax is unremarkable.
IMPRESSION: Large left-sided hydropneumothorax is noted.

## 2021-01-28 MED ORDER — SODIUM CHLORIDE 0.9 % IV SOLN: INTRAVENOUS | Status: DC

## 2021-01-28 MED ORDER — SODIUM CHLORIDE 0.9% FLUSH
10.0000 mL | Freq: Three times a day (TID) | INTRAVENOUS | Status: DC
  Administered 2021-01-29 – 2021-02-04 (×10): 10 mL

## 2021-01-28 MED ORDER — LIDOCAINE HCL 1 % IJ SOLN
INTRAMUSCULAR | Status: AC
  Filled 2021-01-28: qty 20

## 2021-01-28 MED ORDER — IBUPROFEN 200 MG PO TABS
600.0000 mg | ORAL_TABLET | Freq: Once | ORAL | Status: AC
  Administered 2021-01-28: 600 mg via ORAL
  Filled 2021-01-28: qty 3

## 2021-01-28 MED ORDER — ONDANSETRON HCL 4 MG/2ML IJ SOLN
4.0000 mg | Freq: Four times a day (QID) | INTRAMUSCULAR | Status: DC | PRN
  Administered 2021-01-29: 4 mg via INTRAVENOUS
  Filled 2021-01-28: qty 2

## 2021-01-28 MED ORDER — SODIUM CHLORIDE 0.9 % IV BOLUS
1000.0000 mL | Freq: Once | INTRAVENOUS | Status: AC
  Administered 2021-01-28: 1000 mL via INTRAVENOUS

## 2021-01-28 MED ORDER — MORPHINE SULFATE (PF) 2 MG/ML IV SOLN
2.0000 mg | INTRAVENOUS | Status: DC | PRN
  Administered 2021-01-28 – 2021-01-29 (×4): 2 mg via INTRAVENOUS
  Filled 2021-01-28 (×4): qty 1

## 2021-01-28 MED ORDER — GADOBUTROL 1 MMOL/ML IV SOLN
6.0000 mL | Freq: Once | INTRAVENOUS | Status: AC | PRN
  Administered 2021-01-28: 6 mL via INTRAVENOUS

## 2021-01-28 NOTE — Progress Notes (Signed)
Patient was supposed to be seen in the office today. When she was getting into the car to come, she had a syncopal episode. Currently being worked up in the [facility] ED.  Will continue to follow for post discharge needs and follow up.  Oncology Nurse Navigator Documentation  Oncology Nurse Navigator Flowsheets 01/28/2021  Abnormal Finding Date -  Confirmed Diagnosis Date -  Diagnosis Status -  Navigator Follow Up Date: 02/02/2021  Navigator Follow Up Reason: Appointment Review  Navigation Complete Date: -  Post Navigation: Continue to Follow Patient? -  Reason Not Navigating Patient: -  Navigator Location [facility]  Referral Date to RadOnc/MedOnc -  Navigator Encounter Type Appt/Treatment Plan Review  Telephone -  [facility] Date -  Multidisiplinary Clinic Type -  Patient Visit Type MedOnc  Treatment Phase Active Tx  Barriers/Navigation Needs Coordination of Care;Education  Education -  Interventions None Required  Acuity Level 2-Minimal Needs (1-2 Barriers Identified)  Coordination of Care -  Education Method -  Support Groups/Services Friends and Family  Time Spent with Patient 15

## 2021-01-28 NOTE — ED Provider Notes (Signed)
[facility] DEPT Provider Note   CSN: [id number] Arrival date & time: 01/28/21  1215     History Chief Complaint  Patient presents with   Loss of Consciousness    Tami Stone is a 70 y.o. female.   Loss of Consciousness Associated symptoms: shortness of breath and weakness   Patient presents after syncopal episode.  Was at home.  Had been getting up to go to the oncologist.  Has stage IV lung cancer and also stage II breast cancer.  Recent admission to the hospital for pleural effusion.  Had been drained twice by interventional radiology.  Been discharged home.  States she is visiting bed last few days. Weak all over.  Decreased oral intake.  Question of low-grade fever. Reportedly passed out and was unconscious for 45 seconds.  Reportedly had some shaking.  Patient states she does not remember what happened.  States she did come to pretty quickly.    Past Medical History:  Diagnosis Date   Diabetes ([place])    High cholesterol    Lung cancer ([place])    04/2020    Patient Active Problem List   Diagnosis Date Noted   Protein-calorie malnutrition, severe 01/20/2021   Pleural effusion 01/20/2021   Pleural effusion on left 01/18/2021   Malignant neoplasm of lower-outer quadrant of left breast of female, estrogen receptor positive ([place]) 07/03/2020   Goals of care, counseling/discussion 06/02/2020   Adenocarcinoma of left lung, stage 4 ([place]) 05/16/2020   Encounter for antineoplastic chemotherapy 05/16/2020   Mass of left breast 05/14/2020   Mass of lower lobe of left lung 04/28/2020    Past Surgical History:  Procedure Laterality Date   BREAST BIOPSY Right    BRONCHIAL BIOPSY  04/30/2020   Procedure: BRONCHIAL BIOPSIES;  Surgeon: Collene Gobble, MD;  Location: [facility];  Service: Cardiopulmonary;;   BRONCHIAL BRUSHINGS  04/30/2020   Procedure: BRONCHIAL BRUSHINGS;  Surgeon: Collene Gobble, MD;  Location: [facility];  Service:  Cardiopulmonary;;   BRONCHIAL BRUSHINGS  01/20/2021   Procedure: BRONCHIAL BRUSHINGS;  Surgeon: Laurin Coder, MD;  Location: [facility];  Service: Endoscopy;;   BRONCHIAL WASHINGS  01/20/2021   Procedure: BRONCHIAL WASHINGS;  Surgeon: Laurin Coder, MD;  [facility];  Service: Endoscopy;;   HEMOSTASIS CONTROL  04/30/2020   Procedure: HEMOSTASIS CONTROL;  Surgeon: Collene Gobble, MD;  Location: [facility];  Service: Cardiopulmonary;;   VIDEO BRONCHOSCOPY N/A 04/30/2020   Procedure: VIDEO BRONCHOSCOPY WITH FLUORO;  Surgeon: Collene Gobble, MD;  [facility];  Service: Cardiopulmonary;  Laterality: N/A;   VIDEO BRONCHOSCOPY N/A 01/20/2021   Procedure: VIDEO BRONCHOSCOPY WITHOUT FLUORO;  Surgeon: Laurin Coder, MD;  [facility];  Service: Endoscopy;  Laterality: N/A;     OB History   No obstetric history on file.     Family History  Problem Relation Age of Onset   Breast cancer Sister        half sister on maternal side    Social History   Tobacco Use   Smoking status: Never   Smokeless tobacco: Never  Vaping Use   Vaping Use: Never used  Substance Use Topics   Alcohol use: Not Currently   Drug use: Never    Home Medications Prior to Admission medications   Medication Sig Start Date End Date Taking? Authorizing Provider  Coenzyme Q10 (CO Q 10 PO) Take 300 mg by mouth daily.    [provider]  dabrafenib mesylate (  TAFINLAR) 50 MG capsule Take 100 mg by mouth 2 (two) times daily. Take on an empty stomach 1 hour before or 2 hours after meals.    [provider]  DANDELION PO Take 1 capsule by mouth daily.    [provider]  dronabinol (MARINOL) 5 MG capsule Take 1 capsule (5 mg total) by mouth 2 (two) times daily before a meal. 01/25/21   Ennever, Rudell Cobb, MD  ibuprofen (ADVIL) 200 MG tablet Take 400 mg by mouth every 6 (six) hours as needed for mild pain.    [provider]  IVERMECTIN PO  Take 1.8 mLs by mouth daily. Ivermectin oil - 10 mg/ml    [provider]  letrozole (FEMARA) 2.5 MG tablet Take 2.5 mg by mouth daily. 12/22/20   [provider]  metFORMIN (GLUCOPHAGE-XR) 750 MG 24 hr tablet Take 750 mg by mouth 2 (two) times daily.    [provider]  Misc Natural Products (MAGIC MUSHROOM MIX) CAPS Take 1 tablet by mouth daily.    [provider]  Multiple Vitamins-Minerals (MULTIVITAMIN WITH MINERALS) tablet Take 1 tablet by mouth daily.    [provider]  NP THYROID 60 MG tablet Take 60 mg by mouth daily. 02/24/20   [provider]  OVER THE COUNTER MEDICATION Take 2 capsules by mouth 3 (three) times daily. Protectival    [provider]  polyethylene glycol (MIRALAX / GLYCOLAX) 17 g packet Take 17 g by mouth daily. 01/23/21   Georgette Shell, MD  trametinib dimethyl sulfoxide (MEKINIST) 2 MG tablet Take 1 tablet (2 mg total) by mouth daily. Take 1 hour before or 2 hours after a meal. Store refrigerated in original container. 06/02/20   Curt Bears, MD  Zinc 100 MG TABS Take 100 mg by mouth daily.    [provider]    Allergies    Patient has no known allergies.  Review of Systems   Review of Systems  Constitutional:  Positive for appetite change and fatigue.  HENT:  Negative for congestion.   Respiratory:  Positive for shortness of breath.   Cardiovascular:  Positive for syncope. Negative for leg swelling.  Gastrointestinal:  Negative for abdominal pain.  Musculoskeletal:  Negative for back pain.  Neurological:  Positive for syncope and weakness.  Psychiatric/Behavioral:  Negative for behavioral problems.    Physical Exam Updated Vital Signs BP 113/74   Pulse (!) 112   Temp 98.2 F (36.8 C)   Resp (!) 25   Ht 5\' 2"  (1.575 m)   Wt 60 kg   SpO2 96%   BMI 24.19 kg/m   Physical Exam Vitals and nursing note reviewed.  HENT:     Head: Normocephalic.  Eyes:     Pupils: Pupils  are equal, round, and reactive to light.  Cardiovascular:     Rate and Rhythm: Tachycardia present.  Pulmonary:     Comments: Somewhat harsh breath sounds bilaterally.  Decreased breath sounds on the left. Musculoskeletal:        General: No tenderness.  Skin:    General: Skin is warm.     Capillary Refill: Capillary refill takes less than 2 seconds.  Neurological:     Mental Status: She is alert and oriented to person, place, and time.    ED Results / Procedures / Treatments   Labs (all labs ordered are listed, but only abnormal results are displayed) Labs Reviewed  COMPREHENSIVE METABOLIC PANEL - Abnormal; Notable for the following  components:      Result Value   Sodium 133 (*)    Glucose, Bld 143 (*)    Calcium 8.0 (*)    Total Protein 5.9 (*)    Albumin 2.5 (*)    All other components within normal limits  CBC WITH DIFFERENTIAL/PLATELET - Abnormal; Notable for the following components:   RBC 3.75 (*)    Hemoglobin 10.4 (*)    HCT 32.6 (*)    RDW 15.7 (*)    All other components within normal limits  RESP PANEL BY RT-PCR (FLU A&B, COVID) ARPGX2  TROPONIN I (HIGH SENSITIVITY)  TROPONIN I (HIGH SENSITIVITY)    EKG None  Radiology CT HEAD WO CONTRAST (5MM)  Result Date: 01/28/2021 CLINICAL DATA:  Seizure EXAM: CT HEAD WITHOUT CONTRAST TECHNIQUE: Contiguous axial images were obtained from the base of the skull through the vertex without intravenous contrast. COMPARISON:  Brain MRI 05/19/2020 FINDINGS: Brain: There is ill-defined hypodensity in the left frontal lobe subcortical white matter along the orbital gyrus (2-10, 4-15, 5-36). There is no evidence of acute intracranial hemorrhage or territorial infarct. The ventricles are not enlarged. There is no midline shift. Vascular: No hyperdense vessel or unexpected calcification. Skull: Normal. Negative for fracture or focal lesion. Sinuses/Orbits: The imaged paranasal sinuses are clear. The imaged globes and orbits are  unremarkable. Other: The mastoid air cells are clear. IMPRESSION: Ill-defined enhancement in the left frontal lobe as above. 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 Electronically Signed   By: Valetta Mole M.D.   On: 01/28/2021 14:26   DG Chest Portable 1 View  Addendum Date: 01/28/2021   ADDENDUM REPORT: 01/28/2021 13:43 ADDENDUM: Critical Value/emergent results were called by telephone at the time of interpretation on 01/28/2021 at 1:36 pm to provider Davonna Belling , who verbally acknowledged these results. Electronically Signed   By: Marijo Conception M.D.   On: 01/28/2021 13:43   Result Date: 01/28/2021 CLINICAL DATA:  Syncope. EXAM: PORTABLE CHEST 1 VIEW COMPARISON:  January 21, 2021. FINDINGS: Stable cardiomediastinal silhouette. Right lung is clear. Left apical pneumothorax is noted with large pleural effusion. Bony thorax is unremarkable. IMPRESSION: Large left-sided hydropneumothorax is noted. Electronically Signed: By: Marijo Conception M.D. On: 01/28/2021 13:36    Procedures Procedures   Medications Ordered in ED Medications  ibuprofen (ADVIL) tablet 600 mg (has no administration in time range)  sodium chloride 0.9 % bolus 1,000 mL (1,000 mLs Intravenous New Bag/Given 01/28/21 1304)    ED Course  I have reviewed the triage vital signs and the nursing notes.  Pertinent labs & imaging results that were available during my care of the patient were reviewed by me and considered in my medical decision making (see chart for details).    MDM Rules/Calculators/A&P                           Patient with generalized weakness and syncopal episode.  There was some question of seizure activity although could have been just some myoclonic jerking.  No known intracranial malignancy, although there was an MRI back from December and that showed artifact versus a malignancy.  Question of artifact and  needed follow-up in about 4 months.  Appears 4 months was not done and patient and her husband states they knew nothing about possible finding.  No seizure history.  Chest x-ray done and  showed hydropneumothorax.  Discussed with pulmonary.  They will see patient and likely do the chest tube themselves since they were managing the effusion. CT scan head showed possible frontal abnormality.  Concern for malignancy with known history of malignancy although none noted in the brain.  We will get MRI.  Will admit to internal medicine.  Final Clinical Impression(s) / ED Diagnoses Final diagnoses:  Metastatic malignant neoplasm, unspecified site [facility])  Hydropneumothorax  Syncope, unspecified syncope type    Rx / DC Orders ED Discharge Orders     None        Davonna Belling, MD 01/28/21 1457

## 2021-01-28 NOTE — H&P (Signed)
History and Physical    Tami Stone NTI:[id number] DOB: Jun 23, 1950 DOA: 01/28/2021  PCP: Gaynelle Cage, MD  Patient coming from: Home  Chief Complaint: "I've been feeling weak."  HPI: Tami Stone is a 70 y.o. female with medical history significant of S4 adenocarcinoma of the lung, S3 invasive ductal carcinoma of breast, malignant pleural effusion, pericardial effusion. Presenting with weakness. She reports that for the last 2 days she has felt weak and had a poor appetite. She has had intermittent vomiting. Her regular home meds have not been helpful. She denies any abdominal pain or sick contacts w/ similar symptoms. She reports that she was getting ready to go to a follow up onco appointment this morning when she felt incredibly weak. She reports next thing she knew, she was being brought to the ER. Her family reports that she passed out with her eyes rolling in the back of her head and tremors all over. This event lasted about 45 seconds to a minute. When she came to, she dry heaved and requested that someone call a doctor. It was recommended that they come to the ED for evaluation.    ED Course: She was given fluids. She was tachycardic. Vitals were otherwise ok. Lab work was at her baseline. CXR showed a large hydropneumothorax. PCCM was consulted. CTH showed an ill-defined hypodensity in the frontal region. An MRI brain was ordered. TRH was called for admission.   Review of Systems:  Denies CP, dyspnea, palpitations, fever. Reports intermittent diarrhea, N/V, weakness. Review of systems is otherwise negative for all not mentioned in HPI.   PMHx Past Medical History:  Diagnosis Date   Diabetes ([place])    High cholesterol    Lung cancer ([place])    04/2020    PSHx Past Surgical History:  Procedure Laterality Date   BREAST BIOPSY Right    BRONCHIAL BIOPSY  04/30/2020   Procedure: BRONCHIAL BIOPSIES;  Surgeon: Collene Gobble, MD;  Location: [facility];  Service:  Cardiopulmonary;;   BRONCHIAL BRUSHINGS  04/30/2020   Procedure: BRONCHIAL BRUSHINGS;  Surgeon: Collene Gobble, MD;  Location: [facility];  Service: Cardiopulmonary;;   BRONCHIAL BRUSHINGS  01/20/2021   Procedure: BRONCHIAL BRUSHINGS;  Surgeon: Laurin Coder, MD;  [facility];  Service: Endoscopy;;   BRONCHIAL WASHINGS  01/20/2021   Procedure: BRONCHIAL WASHINGS;  Surgeon: Laurin Coder, MD;  [facility];  Service: Endoscopy;;   HEMOSTASIS CONTROL  04/30/2020   Procedure: HEMOSTASIS CONTROL;  Surgeon: Collene Gobble, MD;  Location: [facility];  Service: Cardiopulmonary;;   VIDEO BRONCHOSCOPY N/A 04/30/2020   Procedure: VIDEO BRONCHOSCOPY WITH FLUORO;  Surgeon: Collene Gobble, MD;  Location: [facility];  Service: Cardiopulmonary;  Laterality: N/A;   VIDEO BRONCHOSCOPY N/A 01/20/2021   Procedure: VIDEO BRONCHOSCOPY WITHOUT FLUORO;  Surgeon: Laurin Coder, MD;  [facility];  Service: Endoscopy;  Laterality: N/A;    SocHx  reports that she has never smoked. She has never used smokeless tobacco. She reports that she does not currently use alcohol. She reports that she does not use drugs.  No Known Allergies  FamHx Family History  Problem Relation Age of Onset   Breast cancer Sister        half sister on maternal side    Prior to Admission medications   Medication Sig Start Date End Date Taking? Authorizing Provider  Coenzyme Q10 (CO Q 10 PO) Take 300 mg by mouth daily.    [provider]  dabrafenib mesylate (  TAFINLAR) 50 MG capsule Take 100 mg by mouth 2 (two) times daily. Take on an empty stomach 1 hour before or 2 hours after meals.    [provider]  DANDELION PO Take 1 capsule by mouth daily.    [provider]  dronabinol (MARINOL) 5 MG capsule Take 1 capsule (5 mg total) by mouth 2 (two) times daily before a meal. 01/25/21   Ennever, Rudell Cobb, MD  ibuprofen (ADVIL) 200 MG tablet Take 400 mg by mouth  every 6 (six) hours as needed for mild pain.    [provider]  IVERMECTIN PO Take 1.8 mLs by mouth daily. Ivermectin oil - 10 mg/ml    [provider]  letrozole (FEMARA) 2.5 MG tablet Take 2.5 mg by mouth daily. 12/22/20   [provider]  metFORMIN (GLUCOPHAGE-XR) 750 MG 24 hr tablet Take 750 mg by mouth 2 (two) times daily.    [provider]  Misc Natural Products (MAGIC MUSHROOM MIX) CAPS Take 1 tablet by mouth daily.    [provider]  Multiple Vitamins-Minerals (MULTIVITAMIN WITH MINERALS) tablet Take 1 tablet by mouth daily.    [provider]  NP THYROID 60 MG tablet Take 60 mg by mouth daily. 02/24/20   [provider]  OVER THE COUNTER MEDICATION Take 2 capsules by mouth 3 (three) times daily. Protectival    [provider]  polyethylene glycol (MIRALAX / GLYCOLAX) 17 g packet Take 17 g by mouth daily. 01/23/21   Georgette Shell, MD  trametinib dimethyl sulfoxide (MEKINIST) 2 MG tablet Take 1 tablet (2 mg total) by mouth daily. Take 1 hour before or 2 hours after a meal. Store refrigerated in original container. 06/02/20   Curt Bears, MD  Zinc 100 MG TABS Take 100 mg by mouth daily.    [provider]    Physical Exam: Vitals:   01/28/21 1228 01/28/21 1315 01/28/21 1330 01/28/21 1441  BP:  112/71 114/73 113/74  Pulse:  (!) 111 (!) 111 (!) 112  Resp:  (!) 25 (!) 25 (!) 25  Temp:      SpO2:  96% 95% 96%  Weight: 60 kg     Height: 5\' 2"  (1.575 m)       General: 70 y.o. female resting in bed in NAD Eyes: PERRL, normal sclera ENMT: Nares patent w/o discharge, orophaynx clear, dentition normal, ears w/o discharge/lesions/ulcers Neck: Supple, trachea midline Cardiovascular: tachy, +S1, S2, no m/g/r, equal pulses throughout Respiratory: decreased in left lung field, no w/r/r, normal WOB on 2L [place] GI: BS+, NDNT, no masses noted, no organomegaly noted MSK: No e/c/c Skin: No rashes, bruises,  ulcerations noted Neuro: A&O x 3, no focal deficits Psyc: Appropriate interaction and affect, calm/cooperative  Labs on Admission: I have personally reviewed following labs and imaging studies  CBC: Recent Labs  Lab 01/22/21 0414 01/28/21 1301  WBC 8.4 8.6  NEUTROABS 4.9 7.5  HGB 11.6* 10.4*  HCT 37.1 32.6*  MCV 87.9 86.9  PLT 508* 419   Basic Metabolic Panel: Recent Labs  Lab 01/22/21 0414 01/28/21 1301  NA 135 133*  K 4.0 3.8  CL 96* 100  CO2 30 26  GLUCOSE 115* 143*  BUN 20 22  CREATININE 0.57 0.64  CALCIUM 9.4 8.0*  MG 2.0  --    GFR: Estimated Creatinine Clearance: 51 mL/min (by C-G formula based on SCr of 0.64 mg/dL). Liver Function Tests: Recent Labs  Lab 01/28/21 1301  AST 18  ALT 9  ALKPHOS 79  BILITOT 0.5  PROT 5.9*  ALBUMIN 2.5*   No results for input(s): LIPASE, AMYLASE in the last 168 hours. No results for input(s): AMMONIA in the last 168 hours. Coagulation Profile: No results for input(s): INR, PROTIME in the last 168 hours. Cardiac Enzymes: No results for input(s): CKTOTAL, CKMB, CKMBINDEX, TROPONINI in the last 168 hours. BNP (last 3 results) No results for input(s): PROBNP in the last 8760 hours. HbA1C: No results for input(s): HGBA1C in the last 72 hours. CBG: Recent Labs  Lab 01/21/21 1623 01/21/21 2110 01/22/21 0833 01/22/21 1135  GLUCAP 98 110* 101* 170*   Lipid Profile: No results for input(s): CHOL, HDL, LDLCALC, TRIG, CHOLHDL, LDLDIRECT in the last 72 hours. Thyroid Function Tests: No results for input(s): TSH, T4TOTAL, FREET4, T3FREE, THYROIDAB in the last 72 hours. Anemia Panel: No results for input(s): VITAMINB12, FOLATE, FERRITIN, TIBC, IRON, RETICCTPCT in the last 72 hours. Urine analysis: No results found for: COLORURINE, APPEARANCEUR, LABSPEC, PHURINE, GLUCOSEU, HGBUR, BILIRUBINUR, KETONESUR, PROTEINUR, UROBILINOGEN, NITRITE, LEUKOCYTESUR  Radiological Exams on Admission: CT HEAD WO CONTRAST (5MM)  Result  Date: 01/28/2021 CLINICAL DATA:  Seizure EXAM: CT HEAD WITHOUT CONTRAST TECHNIQUE: Contiguous axial images were obtained from the base of the skull through the vertex without intravenous contrast. COMPARISON:  Brain MRI 05/19/2020 FINDINGS: Brain: There is ill-defined hypodensity in the left frontal lobe subcortical white matter along the orbital gyrus (2-10, 4-15, 5-36). There is no evidence of acute intracranial hemorrhage or territorial infarct. The ventricles are not enlarged. There is no midline shift. Vascular: No hyperdense vessel or unexpected calcification. Skull: Normal. Negative for fracture or focal lesion. Sinuses/Orbits: The imaged paranasal sinuses are clear. The imaged globes and orbits are unremarkable. Other: The mastoid air cells are clear. IMPRESSION: Ill-defined enhancement in the left frontal lobe as above. 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 Electronically Signed   By: Valetta Mole M.D.   On: 01/28/2021 14:26   DG Chest Portable 1 View  Addendum Date: 01/28/2021   ADDENDUM REPORT: 01/28/2021 13:43 ADDENDUM: Critical Value/emergent results were called by telephone at the time of interpretation on 01/28/2021 at 1:36 pm to provider Davonna Belling , who verbally acknowledged these results. Electronically Signed   By: Marijo Conception M.D.   On: 01/28/2021 13:43   Result Date: 01/28/2021 CLINICAL DATA:  Syncope. EXAM: PORTABLE CHEST 1 VIEW COMPARISON:  January 21, 2021. FINDINGS: Stable cardiomediastinal silhouette. Right lung is clear. Left apical pneumothorax is noted with large pleural effusion. Bony thorax is unremarkable. IMPRESSION: Large left-sided hydropneumothorax is noted. Electronically Signed: By: Marijo Conception M.D. On: 01/28/2021 13:36    EKG: Independently reviewed. Sinus tach, no st elevations  Assessment/Plan Syncope     - place on obs, progressive     - CTH w/  frontal lobe hypodensity that is ill-defined; new finding; MRI brain ordered     - given her story, if MRI Brain is positive for new mass, will start keppra; will also need rad-onco consult     - if MRI brain negative and EEG negative, no indication for anti-epileptic; will need to be counseled against driving for next 6 months and need outpt follow up (spoke with neuro -- Dr. 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     - recurrence of effusion     - PCCM consulted, appreciate assistance, CT to be placed per them  Generalized weakness N/V     - likely secondary to poor PO intake and chronic condition     - anti-emetics, dietician consult  Normocytic anemia     - no evidence of bleed     - likely anemia of chronic disease, she is at her baseline Hgb, follow  Hyponatremia     - mild, follow  Hx of pericardial effusion     - recent echo on 01/18/21      - her syncope story is more consistent w/ seizure; her vitals are ok     - if MRI brain is negative and EEG negative, can consider rpt echo to complete syncope w/u  DM2     - on metformin at home     - SSI, glucose checks, DM diet  DVT prophylaxis: SCDs Code Status: FULL  Family Communication: w/ husband at bedside  Consults called: PCCM, sidebarred neuro  Status is: Observation  The patient remains OBS appropriate and will d/c before 2 midnights.  Dispo: The patient is from: Home              Anticipated d/c is to: Home              Patient currently is not medically stable to d/c.   Difficult to place patient No  Time spent coordinating admission: 45 minutes  [facility]  If 7PM-7AM, please contact  night-coverage [email or web address]  01/28/2021, 3:09 PM

## 2021-01-28 NOTE — ED Notes (Signed)
New chest tube canister

## 2021-01-28 NOTE — Consult Note (Signed)
NAMENobie Stone, MRN:  [id number], DOB:  1950/08/26, LOS: 0 ADMISSION DATE:  01/28/2021, CONSULTATION DATE:  8/18 REFERRING MD:  Dr. Alvino Chapel, CHIEF COMPLAINT:  SOB   History of Present Illness:  70 y/o F who presented to [facility] ER on 8/18 with reports of syncope.   The patient was recently admitted from 8/8-8/12 for at least a one month duration of shortness of breath with dry cough.  She was found to have a large left pleural effusion with mediastinal shift to the right.  She was admitted and underwent a thoracentesis completed by IR with 2.4L fluid removed.  Cytology consistent with malignant cells consistent with metastatic adenocarcinoma.   She is followed by Dr. Arnoldo Morale at [facility] from Oncology & Dr. Marin Olp as second opinion. She also follows with an integrative medicine specialist.  She has been using liquid Ivermectin for her cancer.    The patient reports she returned to the ER for what she describes as weakness & nausea.  She reports she has had intermittent fevers at home.  She notes weight loss of 164 lbs down to 119 since her diagnosis in November 2021.  Her daughter has been in visiting from [place] to help care for her and reported the patient became lightheaded and then was altered for approximately 45 seconds with tremors.  The patient has no memory of the events. EMS was activated.  On arrival she had soft normal blood pressures.  She was given IVF.  CT head was evaluated and showed a new ill defined enhancement in the left frontal lobe concerning for possible mass.  CXR showed a large left-sided hydropneumothorax.    PCCM consulted for evaluation of hydropneumothorax.   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 rate (!) 25, height 5' 2"  (1.575 m), weight 60 kg, SpO2 96 %.       No intake or output data in the 24 hours ending 01/28/21 1404 Filed Weights   01/28/21 1228  Weight: 60 kg    Examination: General: thin adult female lying in bed in NAD HENT: MM pink/moist, anicteric, good dentition  Lungs: non-labored at rest but tachypneic, clear on right, diminished on left Cardiovascular: s1s2 RRR, ST on monitor Abdomen: soft, non-tender, bsx4 active  Extremities: warm, dry, no edema Neuro: AAOx4, speech clear, MAE   Resolved Hospital Problem list     Assessment & Plan:   Left Hydropneumothorax  May have been slow leak post thora 8/11. Largely asymptomatic prior to presentation from a pulmonary standpoint.  Doubt syncopal episode related given no shift on CXR.  -place chest tube for decompression of hydropneumothorax -discussed with patient and husband that lung may not re-expand post placement  -follow chest XRAY daily while chest tube in place  -chest tube care per protocol    Syncope  -IVF per [facility]  -work up as below    Metastatic  Adenocarcinoma of Lung with Mets to the Bone Stage III Invasive Ductal Carcinoma of the Lung  Possible metastatic disease to the brain on CT -supportive care -follow up with Dr. Marin Olp at discharge -await MRI brain -follow neuro exam  -EEG  -consider Palliative Care consult given malignancy with progression (now in pleural fluid, possible brain)  GOC  -discussed concept of intubation, CPR with patient and husband in the event of emergency.  She states she would not  want to live on machines.  She believes "ventilators and remdesivir are dirty words".  When reviewed that life support includes ventilators, she indicates she would have to think about it.    Best Practice (right click and "Reselect all SmartList Selections" daily)  Per Primary   Labs   CBC: Recent Labs  Lab 01/22/21 0414  WBC 8.4  NEUTROABS 4.9  HGB 11.6*  HCT 37.1  MCV 87.9  PLT 508*    Basic Metabolic Panel: Recent Labs  Lab 01/22/21 0414  NA 135  K 4.0  CL 96*  CO2 30  GLUCOSE 115*  BUN 20  CREATININE 0.57  CALCIUM 9.4  MG 2.0   GFR: Estimated Creatinine Clearance: 51 mL/min (by C-G formula based on SCr of 0.57 mg/dL). Recent Labs  Lab 01/22/21 0414  WBC 8.4    Liver Function Tests: No results for input(s): AST, ALT, ALKPHOS, BILITOT, PROT, ALBUMIN in the last 168 hours. No results for input(s): LIPASE, AMYLASE in the last 168 hours. No results for input(s): AMMONIA in the last 168 hours.  ABG No results found for: PHART, PCO2ART, PO2ART, HCO3, TCO2, ACIDBASEDEF, O2SAT   Coagulation Profile: No results for input(s): INR, PROTIME in the last 168 hours.  Cardiac Enzymes: No results for input(s): CKTOTAL, CKMB, CKMBINDEX, TROPONINI in the last 168 hours.  HbA1C: No results found for: HGBA1C  CBG: Recent Labs  Lab 01/21/21 1623 01/21/21 2110 01/22/21 0833 01/22/21 1135  GLUCAP 98 110* 101* 170*    Review of Systems: Positives in [place]   Gen: Denies fever, chills, weight change, fatigue, night sweats HEENT: Denies blurred vision, double vision, hearing loss, tinnitus, sinus congestion, rhinorrhea, sore throat, neck stiffness, dysphagia PULM: Denies shortness of breath, cough, sputum production, hemoptysis, wheezing CV: Denies chest pain, edema, orthopnea, paroxysmal nocturnal dyspnea, palpitations GI: Denies abdominal pain, nausea, vomiting, diarrhea, hematochezia, melena, constipation, change in bowel habits GU: Denies dysuria, hematuria,  polyuria, oliguria, urethral discharge Endocrine: Denies hot or cold intolerance, polyuria, polyphagia or appetite change Derm: Denies rash, dry skin, scaling or peeling skin change Heme: Denies easy bruising, bleeding, bleeding gums Neuro: Denies headache, numbness, weakness, slurred speech, loss of memory or consciousness   Past Medical History:  She,  has a past medical history of Diabetes ([place]), High cholesterol, and Lung cancer ([place]).   Surgical History:   Past Surgical History:  Procedure Laterality Date   BREAST BIOPSY Right    BRONCHIAL BIOPSY  04/30/2020   Procedure: BRONCHIAL BIOPSIES;  Surgeon: Collene Gobble, MD;  Location: [facility];  Service: Cardiopulmonary;;   BRONCHIAL BRUSHINGS  04/30/2020   Procedure: BRONCHIAL BRUSHINGS;  Surgeon: Collene Gobble, MD;  Location: [facility] ENDOSCOPY;  Service: Cardiopulmonary;;   BRONCHIAL BRUSHINGS  01/20/2021   Procedure: BRONCHIAL BRUSHINGS;  Surgeon: Laurin Coder, MD;  Location: [facility] ENDOSCOPY;  Service: Endoscopy;;   BRONCHIAL WASHINGS  01/20/2021   Procedure: BRONCHIAL WASHINGS;  Surgeon: Laurin Coder, MD;  Location: [facility] ENDOSCOPY;  Service: Endoscopy;;   HEMOSTASIS CONTROL  04/30/2020  Procedure: HEMOSTASIS CONTROL;  Surgeon: Collene Gobble, MD;  Location: [facility] ENDOSCOPY;  Service: Cardiopulmonary;;   VIDEO BRONCHOSCOPY N/A 04/30/2020   Procedure: VIDEO BRONCHOSCOPY WITH FLUORO;  Surgeon: Collene Gobble, MD;  Location: [facility];  Service: Cardiopulmonary;  Laterality: N/A;   VIDEO BRONCHOSCOPY N/A 01/20/2021   Procedure: VIDEO BRONCHOSCOPY WITHOUT FLUORO;  Surgeon: Laurin Coder, MD;  Location: [facility] ENDOSCOPY;  Service: Endoscopy;  Laterality: N/A;     Social History:   reports that she has never smoked. She has never used smokeless tobacco. She reports that she does not currently use alcohol. She reports that she does not use drugs.   Family History:  Her family history includes Breast cancer in her sister.    Allergies No Known Allergies   Home Medications  Prior to Admission medications   Medication Sig Start Date End Date Taking? Authorizing Provider  Coenzyme Q10 (CO Q 10 PO) Take 300 mg by mouth daily.    [provider]  dabrafenib mesylate (TAFINLAR) 50 MG capsule Take 100 mg by mouth 2 (two) times daily. Take on an empty stomach 1 hour before or 2 hours after meals.    [provider]  DANDELION PO Take 1 capsule by mouth daily.    [provider]  dronabinol (MARINOL) 5 MG capsule Take 1 capsule (5 mg total) by mouth 2 (two) times daily before a meal. 01/25/21   Ennever, Rudell Cobb, MD  ibuprofen (ADVIL) 200 MG tablet Take 400 mg by mouth every 6 (six) hours as needed for mild pain.    [provider]  IVERMECTIN PO Take 1.8 mLs by mouth daily. Ivermectin oil - 10 mg/ml    [provider]  letrozole (FEMARA) 2.5 MG tablet Take 2.5 mg by mouth daily. 12/22/20   [provider]  metFORMIN (GLUCOPHAGE-XR) 750 MG 24 hr tablet Take 750 mg by mouth 2 (two) times daily.    [provider]  Misc Natural Products (MAGIC MUSHROOM MIX) CAPS Take 1 tablet by mouth daily.    [provider]  Multiple Vitamins-Minerals (MULTIVITAMIN WITH MINERALS) tablet Take 1 tablet by mouth daily.    [provider]  NP THYROID 60 MG tablet Take 60 mg by mouth daily. 02/24/20   [provider]  OVER THE COUNTER MEDICATION Take 2 capsules by mouth 3 (three) times daily. Protectival    [provider]  polyethylene glycol (MIRALAX / GLYCOLAX) 17 g packet Take 17 g by mouth daily. 01/23/21   Georgette Shell, MD  trametinib dimethyl sulfoxide (MEKINIST) 2 MG tablet Take 1 tablet (2 mg total) by mouth daily. Take 1 hour before or 2 hours after a meal. Store refrigerated in original container. 06/02/20   Curt Bears, MD  Zinc 100 MG TABS Take 100 mg by mouth daily.    [provider]     Critical care time:  n/a    Tami Gens, MSN, APRN, Tami Stone, [facility] 01/28/2021, 2:04 PM   Please see [email or web address] for pager details.   From 7A-7P if no response, please call [phone] After hours, please call ELink [phone]

## 2021-01-28 NOTE — ED Notes (Signed)
Pt hooked back to tele monitor. Placed socks on pt per her request. Pt complaining of pain in her back. Pt has received pain medication 1 hour ago. If pain doesn't decrease over next 30 minutes will reach out to MD.

## 2021-01-28 NOTE — ED Triage Notes (Signed)
Witness LOC for 45 seconds with tremors. Patient reports prior to LOC she was lightheaded. BP 90/60, ems admin 500ML NS. A&O x4.  Denies seizure hx.

## 2021-01-28 NOTE — Procedures (Signed)
Insertion of Chest Tube Procedure Note  Lia Vigilante  [id number]  10-24-1950  Date:01/28/21  Time:4:35 PM    Provider Performing: Noe Gens, NP-C, AGACNP-BC  Procedure: Chest Tube Insertion (02111)  Indication(s) Hydropneumothorax  Consent Risks of the procedure as well as the alternatives and risks of each were explained to the patient and/or caregiver.  Consent for the procedure was obtained and is signed in the bedside chart.  Patient & husband informed that even with chest tube the lung may not re-expand, risk of re-collapse.   Anesthesia Topical only with 1% lidocaine    Time Out Verified patient identification, verified procedure, site/side was marked, verified correct patient position, special equipment/implants available, medications/allergies/relevant history reviewed, required imaging and test results available.   Sterile Technique Maximal sterile technique including full sterile barrier drape, hand hygiene, sterile gown, sterile gloves, mask, hair covering, sterile ultrasound probe cover (if used).   Procedure Description Ultrasound used to identify appropriate pleural anatomy for placement and overlying skin marked. Area of placement cleaned and draped in sterile fashion.  A 7 French pigtail pleural catheter was placed into the left pleural space using Seldinger technique. Appropriate return of fluid was obtained.  The tube was connected to atrium and placed on -20 cm H2O wall suction.  Pleural tidal noted on water seal, evacuation of air, clear yellow fluid on suction.    Complications/Tolerance None; patient tolerated the procedure well. Chest X-ray is ordered to verify placement.   EBL Minimal  Specimen(s) none  Noe Gens, MSN, APRN, NP-C, AGACNP-BC [facility] 01/28/2021, 4:36 PM   Please see [email or web address] for pager details.   From 7A-7P if no response, please call [phone] After hours, please call ELink  [phone]

## 2021-01-29 ENCOUNTER — Inpatient Hospital Stay (HOSPITAL_COMMUNITY)
Admit: 2021-01-29 | Discharge: 2021-01-29 | Disposition: A | Discharge status: Home / Self Care | Payer: Medicare Other | Attending: Internal Medicine | Admitting: Internal Medicine

## 2021-01-29 ENCOUNTER — Observation Stay (HOSPITAL_COMMUNITY): Payer: Medicare Other

## 2021-01-29 ENCOUNTER — Encounter: Payer: Self-pay | Admitting: *Deleted

## 2021-01-29 DIAGNOSIS — E119 Type 2 diabetes mellitus without complications: Secondary | ICD-10-CM | POA: Diagnosis present

## 2021-01-29 DIAGNOSIS — R569 Unspecified convulsions: Secondary | ICD-10-CM

## 2021-01-29 DIAGNOSIS — Z803 Family history of malignant neoplasm of breast: Secondary | ICD-10-CM | POA: Diagnosis not present

## 2021-01-29 DIAGNOSIS — R55 Syncope and collapse: Secondary | ICD-10-CM | POA: Diagnosis not present

## 2021-01-29 DIAGNOSIS — E7800 Pure hypercholesterolemia, unspecified: Secondary | ICD-10-CM | POA: Diagnosis present

## 2021-01-29 DIAGNOSIS — Z170 Estrogen receptor positive status [ER+]: Secondary | ICD-10-CM | POA: Diagnosis not present

## 2021-01-29 DIAGNOSIS — G9340 Encephalopathy, unspecified: Secondary | ICD-10-CM | POA: Diagnosis present

## 2021-01-29 DIAGNOSIS — Z79811 Long term (current) use of aromatase inhibitors: Secondary | ICD-10-CM | POA: Diagnosis not present

## 2021-01-29 DIAGNOSIS — Z9689 Presence of other specified functional implants: Secondary | ICD-10-CM | POA: Diagnosis not present

## 2021-01-29 DIAGNOSIS — C3490 Malignant neoplasm of unspecified part of unspecified bronchus or lung: Secondary | ICD-10-CM | POA: Diagnosis not present

## 2021-01-29 DIAGNOSIS — C7931 Secondary malignant neoplasm of brain: Secondary | ICD-10-CM | POA: Diagnosis present

## 2021-01-29 DIAGNOSIS — Z85118 Personal history of other malignant neoplasm of bronchus and lung: Secondary | ICD-10-CM | POA: Diagnosis not present

## 2021-01-29 DIAGNOSIS — C7802 Secondary malignant neoplasm of left lung: Secondary | ICD-10-CM | POA: Diagnosis present

## 2021-01-29 DIAGNOSIS — R531 Weakness: Secondary | ICD-10-CM | POA: Diagnosis not present

## 2021-01-29 DIAGNOSIS — C50512 Malignant neoplasm of lower-outer quadrant of left female breast: Secondary | ICD-10-CM | POA: Diagnosis not present

## 2021-01-29 DIAGNOSIS — G936 Cerebral edema: Secondary | ICD-10-CM | POA: Diagnosis present

## 2021-01-29 DIAGNOSIS — Z7984 Long term (current) use of oral hypoglycemic drugs: Secondary | ICD-10-CM | POA: Diagnosis not present

## 2021-01-29 DIAGNOSIS — C50912 Malignant neoplasm of unspecified site of left female breast: Secondary | ICD-10-CM | POA: Diagnosis present

## 2021-01-29 DIAGNOSIS — C799 Secondary malignant neoplasm of unspecified site: Secondary | ICD-10-CM

## 2021-01-29 DIAGNOSIS — J90 Pleural effusion, not elsewhere classified: Secondary | ICD-10-CM | POA: Diagnosis not present

## 2021-01-29 DIAGNOSIS — J910 Malignant pleural effusion: Secondary | ICD-10-CM | POA: Diagnosis present

## 2021-01-29 DIAGNOSIS — Z515 Encounter for palliative care: Secondary | ICD-10-CM | POA: Diagnosis not present

## 2021-01-29 DIAGNOSIS — Z7189 Other specified counseling: Secondary | ICD-10-CM | POA: Diagnosis not present

## 2021-01-29 DIAGNOSIS — E222 Syndrome of inappropriate secretion of antidiuretic hormone: Secondary | ICD-10-CM | POA: Diagnosis present

## 2021-01-29 DIAGNOSIS — Z79899 Other long term (current) drug therapy: Secondary | ICD-10-CM | POA: Diagnosis not present

## 2021-01-29 DIAGNOSIS — C3492 Malignant neoplasm of unspecified part of left bronchus or lung: Secondary | ICD-10-CM | POA: Diagnosis not present

## 2021-01-29 DIAGNOSIS — E039 Hypothyroidism, unspecified: Secondary | ICD-10-CM | POA: Diagnosis present

## 2021-01-29 DIAGNOSIS — Z20822 Contact with and (suspected) exposure to covid-19: Secondary | ICD-10-CM | POA: Diagnosis present

## 2021-01-29 DIAGNOSIS — J948 Other specified pleural conditions: Secondary | ICD-10-CM | POA: Diagnosis present

## 2021-01-29 DIAGNOSIS — C7951 Secondary malignant neoplasm of bone: Secondary | ICD-10-CM | POA: Diagnosis present

## 2021-01-29 DIAGNOSIS — D638 Anemia in other chronic diseases classified elsewhere: Secondary | ICD-10-CM | POA: Diagnosis present

## 2021-01-29 DIAGNOSIS — Z853 Personal history of malignant neoplasm of breast: Secondary | ICD-10-CM | POA: Diagnosis not present

## 2021-01-29 DIAGNOSIS — Z9114 Patient's other noncompliance with medication regimen: Secondary | ICD-10-CM | POA: Diagnosis not present

## 2021-01-29 LAB — CBC
HCT: 31.9 % — ABNORMAL LOW (ref 36.0–46.0)
Hemoglobin: 10.1 g/dL — ABNORMAL LOW (ref 12.0–15.0)
MCH: 27.3 pg (ref 26.0–34.0)
MCHC: 31.7 g/dL (ref 30.0–36.0)
MCV: 86.2 fL (ref 80.0–100.0)
Platelets: 305 10*3/uL (ref 150–400)
RBC: 3.7 MIL/uL — ABNORMAL LOW (ref 3.87–5.11)
RDW: 15.5 % (ref 11.5–15.5)
WBC: 6.1 10*3/uL (ref 4.0–10.5)
nRBC: 0 % (ref 0.0–0.2)

## 2021-01-29 LAB — COMPREHENSIVE METABOLIC PANEL
ALT: 10 U/L (ref 0–44)
AST: 15 U/L (ref 15–41)
Albumin: 2.3 g/dL — ABNORMAL LOW (ref 3.5–5.0)
Alkaline Phosphatase: 72 U/L (ref 38–126)
Anion gap: 6 (ref 5–15)
BUN: 14 mg/dL (ref 8–23)
CO2: 26 mmol/L (ref 22–32)
Calcium: 7.8 mg/dL — ABNORMAL LOW (ref 8.9–10.3)
Chloride: 100 mmol/L (ref 98–111)
Creatinine, Ser: 0.5 mg/dL (ref 0.44–1.00)
GFR, Estimated: >60 mL/min (ref >60)
Glucose, Bld: 108 mg/dL — ABNORMAL HIGH (ref 70–99)
Potassium: 3.7 mmol/L (ref 3.5–5.1)
Sodium: 132 mmol/L — ABNORMAL LOW (ref 135–145)
Total Bilirubin: 0.2 mg/dL — ABNORMAL LOW (ref 0.3–1.2)
Total Protein: 5.4 g/dL — ABNORMAL LOW (ref 6.5–8.1)

## 2021-01-29 LAB — ACID FAST SMEAR (AFB, MYCOBACTERIA): Acid Fast Smear: NEGATIVE

## 2021-01-29 LAB — MRSA NEXT GEN BY PCR, NASAL: MRSA by PCR Next Gen: NOT DETECTED

## 2021-01-29 IMAGING — DX DG CHEST 1V PORT
1 series · 2 of 2 positions shown · non-contrast
Comparison: [DATE]

CLINICAL DATA: Chest tube with hydropneumothorax

EXAM:
PORTABLE CHEST 1 VIEW

[Series 1: chest ap · 0.14mm/px · 2 of 2 slices shown]
[im 1/2]
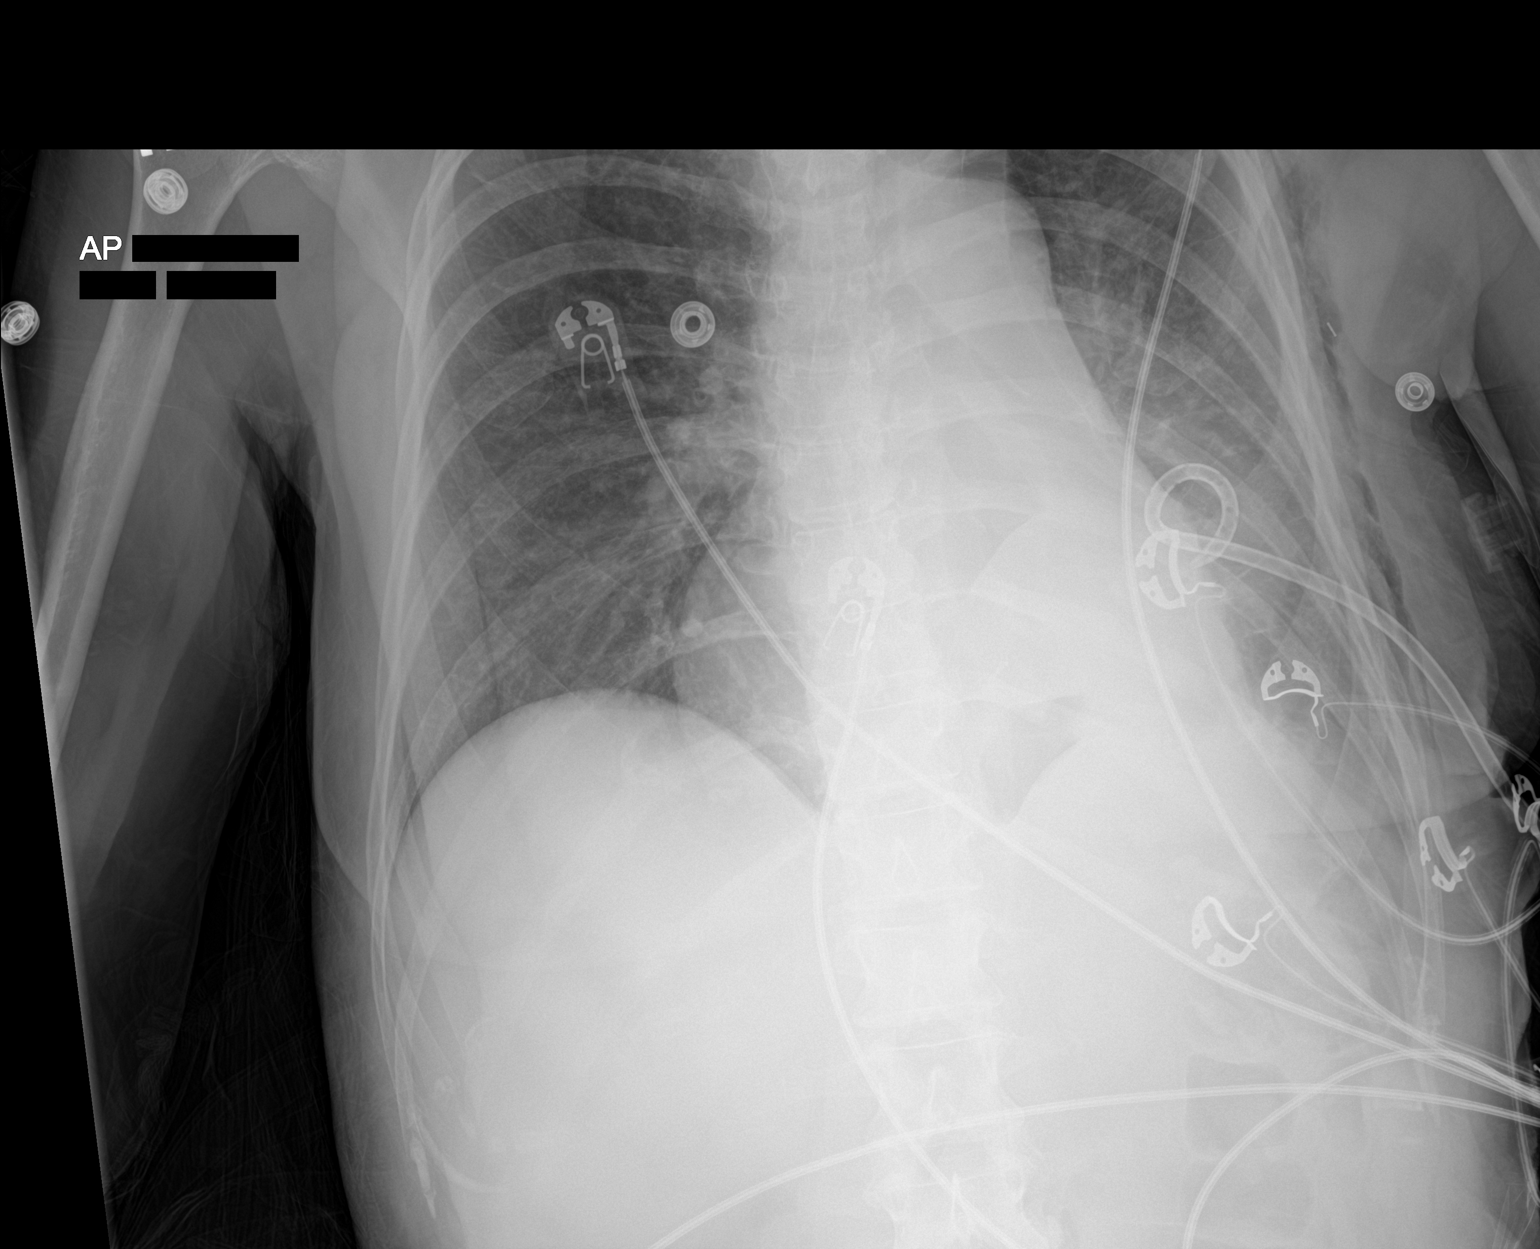
[im 2/2]
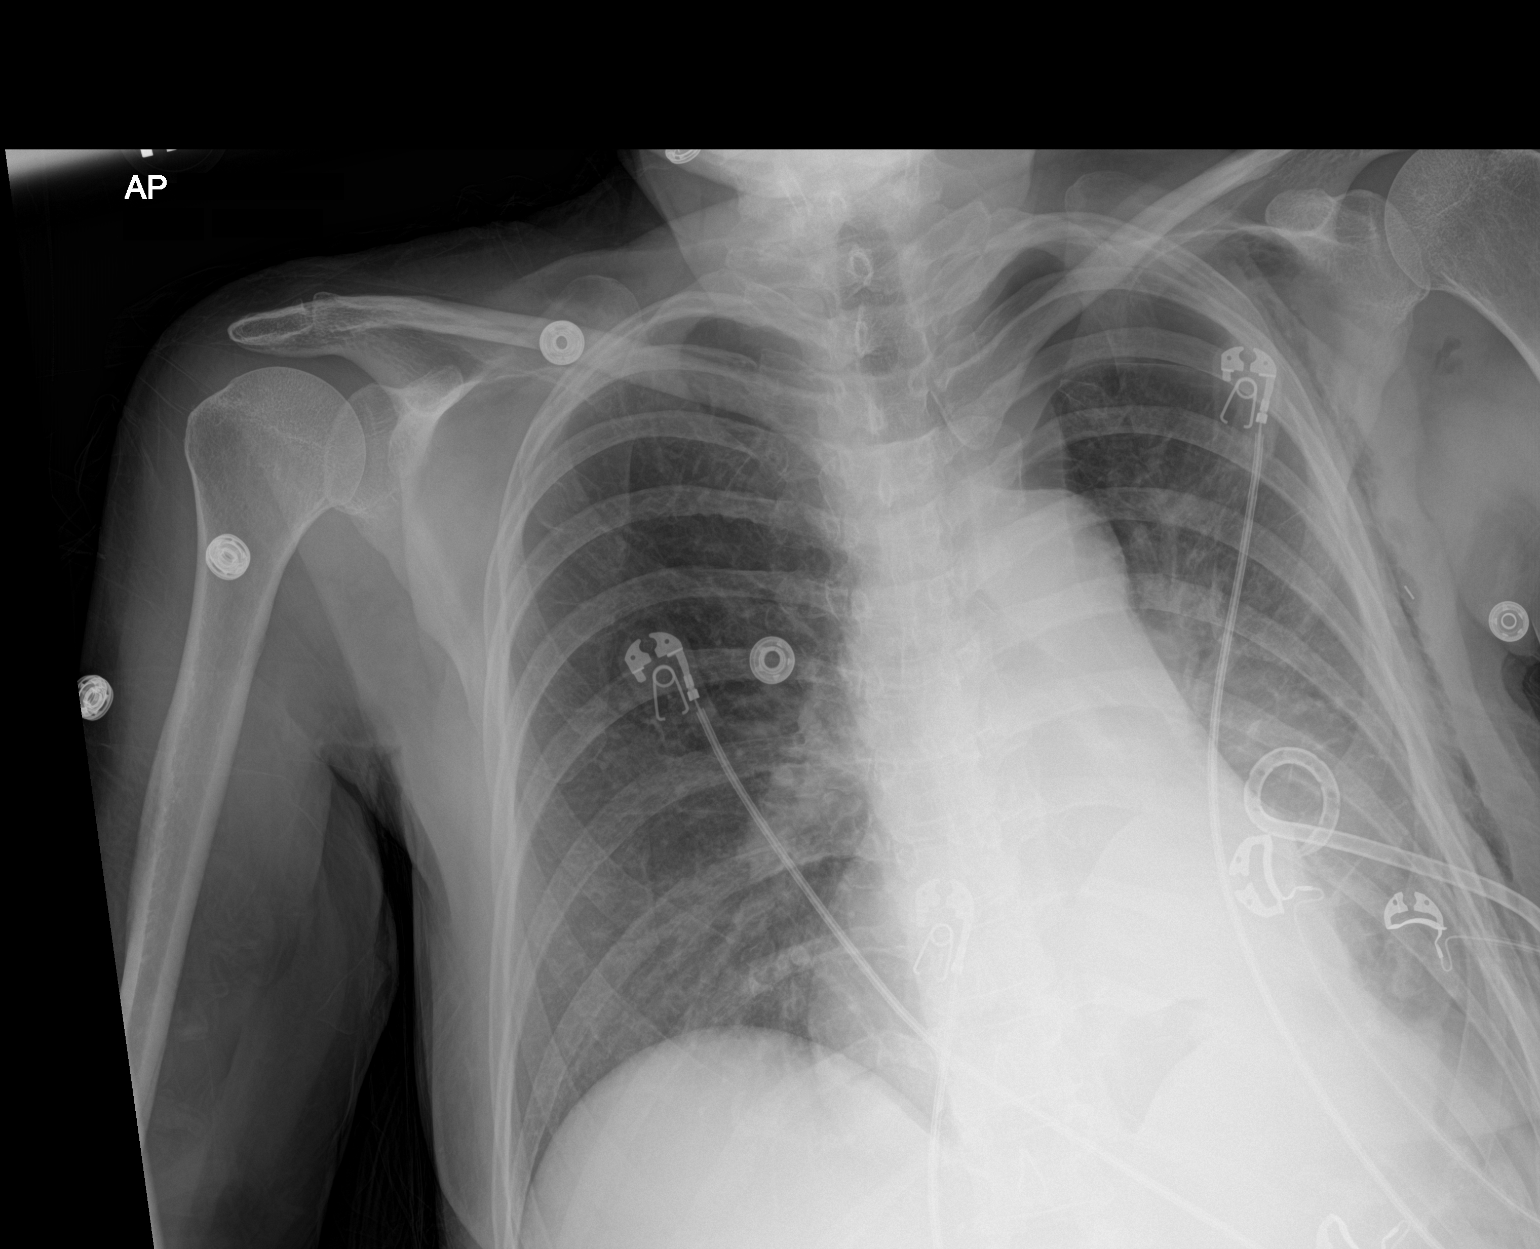

[2 of 2 positions shown; findings below may reference images not displayed]

FINDINGS: Left chest tube in place. Left apical pneumothorax reaching the
fourth posterior rib. Soft tissue emphysema. Hazy density with
probable fissural component at the left base is unchanged. Known
left lower lobe mass. Clear right lung and stable heart size.
IMPRESSION: Unchanged left hydropneumothorax and lower lobe opacification.

## 2021-01-29 MED ORDER — SODIUM CHLORIDE 0.9% FLUSH
3.0000 mL | Freq: Two times a day (BID) | INTRAVENOUS | Status: DC
  Administered 2021-01-29 – 2021-02-01 (×7): 3 mL via INTRAVENOUS

## 2021-01-29 MED ORDER — DEXAMETHASONE 4 MG PO TABS
8.0000 mg | ORAL_TABLET | Freq: Two times a day (BID) | ORAL | Status: DC
  Administered 2021-01-29 – 2021-02-03 (×11): 8 mg via ORAL
  Filled 2021-01-29 (×4): qty 2
  Filled 2021-01-29: qty 4
  Filled 2021-01-29 (×4): qty 2
  Filled 2021-01-29: qty 4
  Filled 2021-01-29 (×2): qty 2

## 2021-01-29 MED ORDER — DRONABINOL 2.5 MG PO CAPS
5.0000 mg | ORAL_CAPSULE | Freq: Two times a day (BID) | ORAL | Status: DC
  Administered 2021-01-30 – 2021-02-05 (×13): 5 mg via ORAL
  Filled 2021-01-29 (×11): qty 2
  Filled 2021-01-29: qty 1
  Filled 2021-01-29: qty 2

## 2021-01-29 MED ORDER — CHLORHEXIDINE GLUCONATE CLOTH 2 % EX PADS
6.0000 | MEDICATED_PAD | Freq: Every day | CUTANEOUS | Status: DC
  Administered 2021-01-29 – 2021-02-04 (×7): 6 via TOPICAL

## 2021-01-29 MED ORDER — THYROID 60 MG PO TABS
60.0000 mg | ORAL_TABLET | Freq: Every day | ORAL | Status: DC
  Administered 2021-01-30 – 2021-02-05 (×7): 60 mg via ORAL
  Filled 2021-01-29 (×7): qty 1

## 2021-01-29 MED ORDER — LEVETIRACETAM IN NACL 500 MG/100ML IV SOLN
500.0000 mg | Freq: Two times a day (BID) | INTRAVENOUS | Status: DC
  Administered 2021-01-29 – 2021-02-01 (×7): 500 mg via INTRAVENOUS
  Filled 2021-01-29 (×7): qty 100

## 2021-01-29 MED ORDER — METFORMIN HCL 850 MG PO TABS
850.0000 mg | ORAL_TABLET | Freq: Two times a day (BID) | ORAL | Status: DC
Start: 2021-01-29 — End: 2021-01-29

## 2021-01-29 MED ORDER — METHOCARBAMOL 500 MG PO TABS
500.0000 mg | ORAL_TABLET | Freq: Four times a day (QID) | ORAL | Status: DC | PRN
  Administered 2021-01-29: 500 mg via ORAL
  Filled 2021-01-29 (×2): qty 1

## 2021-01-29 NOTE — Progress Notes (Signed)
Patient is being treated at [facility] with Dr. Marin Olp.  I will take myself off the care team.

## 2021-01-29 NOTE — ED Notes (Signed)
Messaged NP Olena Heckle in regard to pt heart rate being elevated again and feeling increased crepitous above the insertion site. Was ordered to continue to monitor for increasing amount.

## 2021-01-29 NOTE — ED Notes (Signed)
Pt informed that she will have to use home supply for oral chemo medications.

## 2021-01-29 NOTE — Consult Note (Signed)
Referral MD  Reason for Referral: Metastatic lung cancer-left hydropneumothorax  Chief Complaint  Patient presents with   Loss of Consciousness  : I just got weak at home.  I passed out.  HPI: [name]" is very nice 70 year old white female.  She has metastatic adenocarcinoma of the lung.  Thankfully, the tumor is BRAF mutated.  She had been prescribed Mekinist and Tafinlar.  I am unsure exactly how she been taking these.  She was just in the hospital a couple weeks ago.  She was discharged a week ago.  She had a large left pleural effusion.  She had this drained.  She was supposed coming to the office yesterday for immunotherapy.  However, when she was getting ready, she is appear to have a syncopal episode.  She says she cannot eat all that well.  She has not been taking her medications.  She is on quite a few supplements that she also has not taken.  She was brought to the emergency room.  She was found to have a left hydropneumothorax.  She had a chest tube placed.  I am not sure how much fluid was removed.  She also had a MRI of the brain done.  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  They are quite small.  There is little bit of edema associated with them.  Largest lesion was 6 mm.  Her labs show white cell count of 8.6.  Hemoglobin 10.4.  Platelet count 329,000.  Her BUN is 22 creatinine 0.64.  Calcium is 8.  Albumin 2.5.  She actually looks pretty good when I saw her in the emergency room this morning.  She really does not complain of any pain.  She was not wearing any oxygen.  Her oxygen level was 95% on room air.  She does have the chest tube in place.  I did not talk to her about the brain metastasis as of yet.  The MRI had not been reviewed by me.  She really needs to get back onto her medications.  She is on the [place] and the [place].  She has these at home.  I am sure her husband can bring them in.  She has a poor appetite.  Her albumin of 2.5 I think is  indicative of this.  She has had a bit of a fever couple days ago.  There has been no diarrhea.  She has had no urinary issues.  There is been no dysuria.  Currently, I would have to say her performance status is by ECOG 2.    Past Medical History:  Diagnosis Date   Diabetes ([place])    High cholesterol    Lung cancer ([place])    04/2020  :   Past Surgical History:  Procedure Laterality Date   BREAST BIOPSY Right    BRONCHIAL BIOPSY  04/30/2020   Procedure: BRONCHIAL BIOPSIES;  Surgeon: Collene Gobble, MD;  Location: [place];  Service: Cardiopulmonary;;   BRONCHIAL BRUSHINGS  04/30/2020   Procedure: BRONCHIAL BRUSHINGS;  Surgeon: Collene Gobble, MD;  Location[facility] ENDOSCOPY;  Service: Cardiopulmonary;;   BRONCHIAL BRUSHINGS  01/20/2021   Procedure: BRONCHIAL BRUSHINGS;  Surgeon: Laurin Coder, MD;  Location: [facility] ENDOSCOPY;  Service: Endoscopy;;   BRONCHIAL WASHINGS  01/20/2021   Procedure: BRONCHIAL WASHINGS;  Surgeon: Laurin Coder, MD;  Location: [facility] ENDOSCOPY;  Service: Endoscopy;;   HEMOSTASIS CONTROL  04/30/2020   Procedure: HEMOSTASIS CONTROL;  Surgeon: Collene Gobble, MD;  Location[facility]  ENDOSCOPY;  Service: Cardiopulmonary;;   VIDEO BRONCHOSCOPY N/A 04/30/2020   Procedure: VIDEO BRONCHOSCOPY WITH FLUORO;  Surgeon: Collene Gobble, MD;  Location: [place];  Service: Cardiopulmonary;  Laterality: N/A;   VIDEO BRONCHOSCOPY N/A 01/20/2021   Procedure: VIDEO BRONCHOSCOPY WITHOUT FLUORO;  Surgeon: Laurin Coder, MD;  Location: [facility] ENDOSCOPY;  Service: Endoscopy;  Laterality: N/A;  :   Current Facility-Administered Medications:    0.9 %  sodium chloride infusion, , Intravenous, Continuous, Kyle, Tyrone A, DO, Last Rate: 75 mL/hr at 01/28/21 1638, New Bag at 01/28/21 1638   morphine 2 MG/ML injection 2 mg, 2 mg, Intravenous, Q4H PRN, Marylyn Ishihara, Tyrone A, DO, 2 mg at 01/29/21 0349   ondansetron (ZOFRAN) injection 4 mg, 4 mg, Intravenous, Q6H PRN, Marylyn Ishihara, Tyrone A, DO    sodium chloride flush (NS) 0.9 % injection 10 mL, 10 mL, Intracatheter, Q8H, Ollis, Brandi L, NP  Current Outpatient Medications:    Coenzyme Q10 (CO Q 10 PO), Take 300 mg by mouth at bedtime., Disp: , Rfl:    dabrafenib mesylate (TAFLINAR) 50 MG capsule, Take 100 mg by mouth 2 (two) times daily. Take on an empty stomach 1 hour before or 2 hours after meals., Disp: , Rfl:    DANDELION PO, Take 1 capsule by mouth daily., Disp: , Rfl:    dronabinol (MARINOL) 5 MG capsule, Take 1 capsule (5 mg total) by mouth 2 (two) times daily before a meal., Disp: 60 capsule, Rfl: 2   ibuprofen (ADVIL) 200 MG tablet, Take 400 mg by mouth every 6 (six) hours as needed for mild pain., Disp: , Rfl:    IVERMECTIN PO, Take 1.8 mLs by mouth daily. Ivermectin oil - 10 mg/ml, Disp: , Rfl:    letrozole (FEMARA) 2.5 MG tablet, Take 2.5 mg by mouth daily., Disp: , Rfl:    metFORMIN (GLUCOPHAGE-XR) 750 MG 24 hr tablet, Take 750 mg by mouth 2 (two) times daily., Disp: , Rfl:    Misc Natural Products (MAGIC MUSHROOM MIX) CAPS, Take 1 tablet by mouth daily., Disp: , Rfl:    Multiple Vitamins-Minerals (MULTIVITAMIN WITH MINERALS) tablet, Take 1 tablet by mouth daily., Disp: , Rfl:    NP THYROID 60 MG tablet, Take 60 mg by mouth daily., Disp: , Rfl:    OVER THE COUNTER MEDICATION, Take 2 capsules by mouth 3 (three) times daily. Protectival, Disp: , Rfl:    polyethylene glycol (MIRALAX / GLYCOLAX) 17 g packet, Take 17 g by mouth daily. (Patient taking differently: Take 17 g by mouth daily as needed for moderate constipation.), Disp: 14 each, Rfl: 0   trametinib dimethyl sulfoxide (MEKINIST) 2 MG tablet, Take 1 tablet (2 mg total) by mouth daily. Take 1 hour before or 2 hours after a meal. Store refrigerated in original container., Disp: 30 tablet, Rfl: 3   Zinc 100 MG TABS, Take 100 mg by mouth daily., Disp: , Rfl: :   sodium chloride flush  10 mL Intracatheter Q8H  :  No Known Allergies:   Family History  Problem Relation  Age of Onset   Breast cancer Sister        half sister on maternal side  :   Social History   Socioeconomic History   Marital status: Married    Spouse name: Not on file   Number of children: Not on file   Years of education: Not on file   Highest education level: Not on file  Occupational History   Not on file  Tobacco Use   Smoking status: Never   Smokeless tobacco: Never  Vaping Use   Vaping Use: Never used  Substance and Sexual Activity   Alcohol use: Not Currently   Drug use: Never   Sexual activity: Not on file  Other Topics Concern   Not on file  Social History Narrative   Not on file   Social Determinants of Health   Financial Resource Strain: Not on file  Food Insecurity: Not on file  Transportation Needs: Not on file  Physical Activity: Not on file  Stress: Not on file  Social Connections: Not on file  Intimate Partner Violence: Not on file  :  Review of Systems  Constitutional:  Positive for malaise/fatigue.  HENT: Negative.    Eyes: Negative.   Respiratory:  Positive for shortness of breath.   Cardiovascular:  Positive for chest pain.  Gastrointestinal:  Positive for nausea.  Genitourinary: Negative.   Musculoskeletal:  Positive for joint pain and myalgias.  Skin: Negative.   Neurological:  Positive for dizziness, focal weakness and loss of consciousness.  Endo/Heme/Allergies: Negative.   Psychiatric/Behavioral: Negative.      Exam: Patient Vitals for the past 24 hrs:  BP Temp Pulse Resp SpO2 Height Weight  01/29/21 0645 112/78 -- (!) 102 -- 95 % -- --  01/29/21 0630 126/80 -- (!) 107 (!) 25 98 % -- --  01/29/21 0615 123/81 -- (!) 103 -- 100 % -- --  01/29/21 0600 112/78 -- 99 (!) 26 96 % -- --  01/29/21 0530 124/81 -- (!) 108 (!) 24 99 % -- --  01/29/21 0515 118/77 -- (!) 103 (!) 32 94 % -- --  01/29/21 0230 128/89 -- (!) 111 -- 99 % -- --  01/29/21 0215 119/79 -- (!) 108 (!) 22 100 % -- --  01/29/21 0145 118/83 -- (!) 110 -- 100 % -- --   01/29/21 0130 129/88 -- (!) 111 (!) 31 95 % -- --  01/29/21 0115 122/83 -- (!) 107 (!) 29 96 % -- --  01/29/21 0045 110/78 -- (!) 109 -- 100 % -- --  01/29/21 0015 102/65 -- 83 (!) 22 99 % -- --  01/28/21 2345 96/65 -- 83 -- 100 % -- --  01/28/21 2330 97/66 -- 81 (!) 22 100 % -- --  01/28/21 2315 97/66 -- 81 -- 97 % -- --  01/28/21 2245 97/68 -- 83 (!) 23 94 % -- --  01/28/21 2230 108/71 -- 85 (!) 23 97 % -- --  01/28/21 2000 102/68 -- 88 (!) 22 97 % -- --  01/28/21 1945 91/66 -- 87 (!) 22 98 % -- --  01/28/21 1800 (!) 95/57 -- 94 20 96 % -- --  01/28/21 1441 113/74 -- (!) 112 (!) 25 96 % -- --  01/28/21 1330 114/73 -- (!) 111 (!) 25 95 % -- --  01/28/21 1315 112/71 -- (!) 111 (!) 25 96 % -- --  01/28/21 1228 -- -- -- -- -- 5' 2"  (1.575 m) 132 lb 4.4 oz (60 kg)  01/28/21 1223 111/76 98.2 F (36.8 C) (!) 115 20 97 % -- --   This is a petite white female in no obvious distress.  Vital signs show temperature of 99.  Pulse 102.  Blood pressure 112/78.  Weight is 132 pounds.  Oxygen saturation is 95%.  Her oral exam shows no mucositis.  Ocular exam shows good pupillary reflex.  She has good extraocular muscle movement.  She  has no adenopathy in the neck.  Lungs are clear on the right side.  She has a chest tube in the left side.  I can hear breath sounds.  She has relatively decent air movement on the left side.  Cardiac exam is tachycardic but regular.  There are no murmurs.  Abdomen is soft.  Bowel sounds are present.  There is no fluid wave.  There is no palpable liver or spleen tip.  Extremity shows no clubbing, cyanosis or edema.  Neurological exam is nonfocal.   Recent Labs    01/28/21 1301  WBC 8.6  HGB 10.4*  HCT 32.6*  PLT 329    Recent Labs    01/28/21 1301  NA 133*  K 3.8  CL 100  CO2 26  GLUCOSE 143*  BUN 22  CREATININE 0.64  CALCIUM 8.0*    Blood smear review: None  Pathology: None    Assessment and Plan: Ms. Falzon is a very nice 70 year old white  female with metastatic adenocarcinoma of the lung.  She had an admission about a week or so ago.  She had a left pleural effusion.  She underwent thoracentesis.  She now has a left hydropneumothorax.  I will ensure that this was related to the thoracentesis that she had last week.  I am sure that having tumor is probably part of the problem.  She has the chest tube in.  I will have to rely on pulmonary medicine to decide when to pull the tube.  She does have the brain metastasis now.  This is quite unfortunate.  I think that these are quite small.  Stereotactic radiosurgery would certainly be a good way of trying to help these.  She really needs to get back onto her Mekinist and Tafinlar.  Again I know the hospital does not carry these medications.  She will have to have her husband bring bring this in from home.  This is quite a difficult situation.  I know she is trying her best.  I know she is been relying on her complementary therapies to try to help her lung cancer.  I am sure she will be in the hospital for a few days.  We will follow her along.  I know she will get outstanding care no matter where she is in the hospital.  The entire [facility] staff do a fantastic job with all of our patients.  Lattie Haw, MD  Psalm 46:10

## 2021-01-29 NOTE — Progress Notes (Signed)
   01/29/21 1700  Clinical Encounter Type  Visited With Patient and family together  Visit Type Initial;Spiritual support;Psychological support  Referral From Nurse  Consult/Referral To Chaplain  Spiritual Encounters  Spiritual Needs Prayer;Emotional  Stress Factors  Patient Stress Factors Family relationships;Health changes;Loss;Loss of control;Major life changes  Family Stress Factors Family relationships;Loss of control;Major life changes  [facility] Chaplain met with patient at bedside in ED this afternoon.  She was newly diagnosed and concerned for her future and her future of her disabled adult daughter.  Son was at patient's bedside - patient described her faith narrative and her family dynamics as she expressed her emotions and worries and fears.  Chaplain provided spiritual care in the form of life narrative, faith journey, family history and presence of her son at her side today.  She spoke at length of her daughter's health challenges and how her siblings will break the news to her tonight regarding their mother's current medical stratus.  Chaplain provided comfort and counseling.   Patient requested prayer.   Chaplain prayed with and for family.    Respectfully submitted,  Rev. Santiago Glad Huddelson-Judson Tsan

## 2021-01-29 NOTE — ED Notes (Signed)
Messaged NP Olena Heckle regarding pt back pain. No new orders given at this time.

## 2021-01-29 NOTE — ED Notes (Signed)
Patient ambulated to bedside commode and back, continent with urine. Felt nauseated during transfer, IV Zofran given.

## 2021-01-29 NOTE — ED Notes (Signed)
Given dinner tray. Chaplin was paged to bedside to consult with patient and son.

## 2021-01-29 NOTE — ED Notes (Signed)
Provided with breakfast tray

## 2021-01-29 NOTE — Procedures (Signed)
Patient Name: Tami Stone  MRN: [id number]  Epilepsy Attending: Lora Havens  Referring Physician/Provider: Dr Cherylann Ratel Date: 01/29/2021 Duration: 25 mins  Patient history: 70yo F who presented with syncope. 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 EEG to evaluate for seizure.   Level of alertness: Awake, drowsy  AEDs during EEG study: LEV  Technical aspects: This EEG study was done with scalp electrodes positioned according to the 10-20 International system of electrode placement. Electrical activity was acquired at a sampling rate of 500Hz  and reviewed with a high frequency filter of 70Hz  and a low frequency filter of 1Hz . EEG data were recorded continuously and digitally stored.   Description: The posterior dominant rhythm consists of 9 Hz activity of moderate voltage (25-35 uV) seen predominantly in posterior head regions, symmetric and reactive to eye opening and eye closing. Drowsiness was characterized by attenuation of the posterior background rhythm. EEG showed intermittent generalized 3 to 6 Hz theta-delta slowing. Hyperventilation and photic stimulation were not performed.     ABNORMALITY - Intermittent slow, generalized  IMPRESSION: This study is suggestive of mild diffuse encephalopathy, nonspecific etiology. No seizures or epileptiform discharges were seen throughout the recording.  Annslee Tercero Barbra Sarks

## 2021-01-29 NOTE — Progress Notes (Signed)
NAMEAleshia Stone, MRN:  [id number], DOB:  Jun 27, 1950, LOS: 0 ADMISSION DATE:  01/28/2021, CONSULTATION DATE:  8/18 REFERRING MD:  Dr. Alvino Chapel, CHIEF COMPLAINT:  SOB   History of Present Illness:  70 y/o F who presented to [facility] ER on 8/18 with reports of syncope.   The patient was recently admitted from 8/8-8/12 for at least a one month duration of shortness of breath with dry cough.  She was found to have a large left pleural effusion with mediastinal shift to the right.  She was admitted and underwent a thoracentesis completed by IR with 2.4L fluid removed.  Cytology consistent with malignant cells consistent with metastatic adenocarcinoma.   She is followed by Dr. Arnoldo Morale at [facility] from Oncology & Dr. Marin Olp as second opinion. She also follows with an integrative medicine specialist.  She has been using liquid Ivermectin for her cancer.    The patient reports she returned to the ER for what she describes as weakness & nausea.  She reports she has had intermittent fevers at home.  She notes weight loss of 164 lbs down to 119 since her diagnosis in November 2021.  Her daughter has been in visiting from [place] to help care for her and reported the patient became lightheaded and then was altered for approximately 45 seconds with tremors.  The patient has no memory of the events. EMS was activated.  On arrival she had soft normal blood pressures.  She was given IVF.  CT head was evaluated and showed a new ill defined enhancement in the left frontal lobe concerning for possible mass.  CXR showed a large left-sided hydropneumothorax.    PCCM consulted for evaluation of hydropneumothorax.   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+, prescribed Femara (does not regularly  take, uses liquid Ivermectin for therapy)  Significant Hospital Events: Including procedures, antibiotic start and stop dates in addition to other pertinent events   8/18 Admit post syncopal episode, hydropneumothorax s/p chest tube placement  8/19 CXR with residual hydropneumothorax, 2.4L drained from chest  Interim History / Subjective:  2.4L drainage from [place] overnight  Pt reports feeling tired and sore  States "I've had better days - they just told me I have brain cancer"  Objective   Blood pressure 111/81, pulse 98, temperature 98.2 F (36.8 C), resp. rate (!) 25, height _0  (1.575 m), weight 60 kg, SpO2 96 %.        Intake/Output Summary (Last 24 hours) at 01/29/2021 0936 Last data filed at 01/29/2021 0901 Gross per 24 hour  Intake 1010 ml  Output 2390 ml  Net -1380 ml   Filed Weights   01/28/21 1228  Weight: 60 kg    Examination: General:  thin adult female lying in bed in NAD HEENT: MM pink/moist, no jvd, anicteric  Neuro: AAOx4, speech clear, MAE  CV: s1s2 RRR, no m/r/g PULM: non-labored at rest, improved air movement on left, clear on right, left chest tube with clear yellow drainage GI: soft, bsx4 active  Extremities: warm/dry, no edema  Skin: no rashes or lesions  Resolved Hospital Problem list     Assessment & Plan:   Left Hydropneumothorax  May have been slow leak post thora 8/11. Largely asymptomatic prior to presentation from a pulmonary standpoint.  Doubt syncopal episode related given no shift on CXR.  -continue chest tube overnight -follow up CXR in am  -patient indicates she does not think she wants any therapy for the cancer.  States she hopes to go home to be with her family. If this is her choice, we can try to get the chest tube out tomorrow with follow up before she goes home. I encouraged her to consider establishing a relationship with palliative care so she can have symptom control moving forward -chest tube care per protocol    Syncope  -per [facility]    Metastatic Adenocarcinoma of Lung with Mets to the Bone Stage III Invasive Ductal Carcinoma of the Lung  Possible metastatic disease to the brain on CT -EEG negative 8/19  -MRI brain with small areas concerning for metastatic disease  -will need follow up with Dr. Marin Olp at discharge if patient elects to move forward with therapy  -seizure precautions   GOC  -Palliative Care consult   Best Practice (right click and "Reselect all SmartList Selections" daily)  Per Primary    Critical care time: n/a    Noe Gens, MSN, APRN, NP-C, AGACNP-BC [facility] 01/29/2021, 9:36 AM   Please see [email or web address] for pager details.   From 7A-7P if no response, please call [phone] After hours, please call ELink [phone]

## 2021-01-29 NOTE — Progress Notes (Signed)
EEG Completed; Results Pending  

## 2021-01-29 NOTE — Progress Notes (Signed)
[facility]  PROGRESS NOTE  Tami Stone ZYS:[id number] DOB: 10-15-1950 DOA: 01/28/2021 PCP: Gaynelle Cage, MD   Brief HPI:   70 year old female with history of metastatic adenocarcinoma of the lung, invasive ductal carcinoma of the breast, malignant pleural effusion, pericardial effusion presented with weakness.  Also passed out with tremors at home.  Chest x-ray showed large hydropneumothorax.  PCCM was consulted and chest tube was placed.  CT head showed ill-defined hypodensity in the frontal region.  MRI brain showed small multiple metastasis with mild surrounding edema.    Subjective   Patient seen and examined, denies any weakness.  No overnight seizures.   Assessment/Plan:    Syncope/?  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 twice daily; obtain EEG -We will also start Decadron 8 mg p.o. twice daily, discussed with Dr. Marin Olp -Dr. 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  SpO2: 97 %    Vitals:   01/29/21 1400 01/29/21 1430 01/29/21 1500 01/29/21 1530  BP: 104/70 108/71 115/76 116/79  Pulse: 95 96 99 99  Resp: (!) 28 (!) 28 19 (!) 27   Temp:      SpO2: 98% 98% 99% 97%  Weight:      Height:         Intake/Output Summary (Last 24 hours) at 01/29/2021 1606 Last data filed at 01/29/2021 1045 Gross per 24 hour  Intake 1110 ml  Output 2390 ml  Net -1280 ml    08/17 1901 - 08/19 0700 In: 1000  Out: 2350   Filed Weights   01/28/21 1228  Weight: 60 kg    CBC:  Recent Labs  Lab 01/28/21 1301 01/29/21 0737  WBC 8.6 6.1  HGB 10.4* 10.1*  HCT 32.6* 31.9*  PLT 329 305  MCV 86.9 86.2  MCH 27.7 27.3  MCHC 31.9 31.7  RDW 15.7* 15.5  LYMPHSABS 0.7  --   MONOABS 0.3  --   EOSABS 0.0  --   BASOSABS 0.0  --     Complete metabolic panel:  Recent Labs  Lab 01/28/21 1301 01/29/21 0737  NA 133* 132*  K 3.8 3.7  CL 100 100  CO2 26 26  GLUCOSE 143* 108*  BUN 22 14  CREATININE 0.64 0.50  CALCIUM 8.0* 7.8*  AST 18 15  ALT 9 10  ALKPHOS 79 72  BILITOT 0.5 0.2*  ALBUMIN 2.5* 2.3*    No results for input(s): LIPASE, AMYLASE in the last 168 hours.  Recent Labs  Lab 01/28/21 1301  SARSCOV2NAA NEGATIVE    ------------------------------------------------------------------------------------------------------------------ No results for input(s): CHOL, HDL, LDLCALC, TRIG, CHOLHDL, LDLDIRECT in the last 72 hours.  No results found for: HGBA1C ------------------------------------------------------------------------------------------------------------------  No results for input(s): TSH, T4TOTAL, T3FREE, THYROIDAB in the last 72 hours.  Invalid input(s): FREET3 ------------------------------------------------------------------------------------------------------------------ No results for input(s): VITAMINB12, FOLATE, FERRITIN, TIBC, IRON, RETICCTPCT in the last 72 hours.  Coagulation profile No results for input(s): INR, PROTIME in the last 168 hours. No results for input(s): DDIMER in the last 72 hours.  Cardiac Enzymes No results for input(s): CKTOTAL, CKMB, CKMBINDEX, TROPONINI in the last 168  hours.  ------------------------------------------------------------------------------------------------------------------    Component Value Date/Time   BNP 16.6 01/18/2021 1400     Antibiotics: Anti-infectives (From admission, onward)    None        Radiology Reports  CT HEAD WO CONTRAST (5MM)  Result Date: 01/28/2021 CLINICAL DATA:  Seizure EXAM: CT HEAD WITHOUT CONTRAST TECHNIQUE: Contiguous axial images were obtained from the base of the skull through the vertex without intravenous contrast. COMPARISON:  Brain MRI 05/19/2020 FINDINGS: Brain: There is ill-defined hypodensity in the left frontal lobe subcortical white matter along the orbital gyrus (2-10, 4-15, 5-36). There is no evidence of acute intracranial hemorrhage or territorial infarct. The ventricles are not enlarged. There is no midline shift. Vascular: No hyperdense vessel or unexpected calcification. Skull: Normal. Negative for fracture or focal lesion. Sinuses/Orbits: The imaged paranasal sinuses are clear. The imaged globes and orbits are unremarkable. Other: The mastoid air cells are clear. IMPRESSION: Ill-defined enhancement in the left frontal lobe as above. 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 Electronically Signed   By: Valetta Mole M.D.   On: 01/28/2021 14:26   MR Brain W and Wo Contrast  Result Date: 01/28/2021 CLINICAL DATA:  Seizure, nontraumatic (Age >= 52y). EXAM: MRI HEAD WITHOUT AND WITH CONTRAST TECHNIQUE: Multiplanar, multiecho pulse sequences of the brain and surrounding structures were obtained without and with intravenous contrast. CONTRAST:  91m GADAVIST GADOBUTROL 1 MMOL/ML IV SOLN COMPARISON:  Head CT 01/28/2021. FINDINGS: Brain: Cerebral volume is normal. There are multiple enhancing intracranial lesions compatible with intracranial metastatic disease, as follows. 4 mm enhancing lesion within the  right frontal lobe with mild surrounding edema (series 16, image 96). 3 mm enhancing lesion within the posterior left frontal lobe with mild surrounding edema (series 16, image 98). 6 mm enhancing lesion within the anterior left frontal lobe with mild surrounding edema (series 16, image 67). 2 mm enhancing lesion within the left parietal lobe with mild surrounding edema (series 16, image 91). 2 mm enhancing lesion within the right cerebellar hemisphere (series 16, image 29). 2 mm enhancing lesion within the left cerebellar hemisphere (series 16, image 18). Mild multifocal T2/FLAIR hyperintensity elsewhere within the cerebral white matter and pons is nonspecific, but compatible chronic small vessel ischemic disease. There is a punctate focus of apparent diffusion weighted hyperintensity within the right cerebellum appreciated on the axial diffusion-weighted imaging only (series 5, image 55). No chronic intracranial blood products. No extra-axial fluid collection. No midline shift. Vascular: Maintained flow voids within the proximal large arterial vessels. Skull and upper cervical spine: No focal suspicious marrow lesion. Sinuses/Orbits: Visualized orbits show no acute finding. Trace bilateral ethmoid sinus mucosal thickening. IMPRESSION: At least six enhancing metastatic lesions are identified within the supratentorial and infratentorial brain, measuring up to 4 mm, some with mild surrounding edema, as detailed. Punctate focus of diffusion weighted signal abnormality within the medial right cerebellum, appreciated on the axial diffusion-weighted sequence only. This may reflect an acute infarct, a poorly enhancing metastasis or artifact.  Background mild chronic small vessel ischemic changes within the cerebral white matter and pons. Electronically Signed   By: Kellie Simmering D.O.   On: 01/28/2021 18:03   DG CHEST PORT 1 VIEW  Result Date: 01/29/2021 CLINICAL DATA:  Chest tube with hydropneumothorax EXAM: PORTABLE  CHEST 1 VIEW COMPARISON:  01/28/2021 FINDINGS: Left chest tube in place. Left apical pneumothorax reaching the fourth posterior rib. Soft tissue emphysema. Hazy density with probable fissural component at the left base is unchanged. Known left lower lobe mass. Clear right lung and stable heart size. IMPRESSION: Unchanged left hydropneumothorax and lower lobe opacification. Electronically Signed   By: Monte Fantasia M.D.   On: 01/29/2021 05:45   DG CHEST PORT 1 VIEW  Result Date: 01/28/2021 CLINICAL DATA:  Status post chest tube placement EXAM: PORTABLE CHEST 1 VIEW COMPARISON:  Film from earlier in the same day. FINDINGS: Pigtail catheter is now seen on the left with significant reduction in pleural effusion. The lung is incompletely re-expanded with both an apical and inferolateral pneumothorax component. These changes are likely related to incomplete re-expansion of the lung as opposed to a true pneumothorax. This is related to the longstanding pleural effusion. Right lung is clear. Cardiac shadow is within normal limits. IMPRESSION: Status post chest tube placement with significant reduction in left-sided pleural effusion. There remains left lung consolidation and incomplete re-expansion as described related to the longstanding nature of the effusion as opposed to a true pneumothorax. Follow-up films are recommended. Electronically Signed   By: Inez Catalina M.D.   On: 01/28/2021 17:01   DG Chest Portable 1 View  Addendum Date: 01/28/2021   ADDENDUM REPORT: 01/28/2021 13:43 ADDENDUM: Critical Value/emergent results were called by telephone at the time of interpretation on 01/28/2021 at 1:36 pm to provider Davonna Belling , who verbally acknowledged these results. Electronically Signed   By: Marijo Conception M.D.   On: 01/28/2021 13:43   Result Date: 01/28/2021 CLINICAL DATA:  Syncope. EXAM: PORTABLE CHEST 1 VIEW COMPARISON:  January 21, 2021. FINDINGS: Stable cardiomediastinal silhouette. Right lung is  clear. Left apical pneumothorax is noted with large pleural effusion. Bony thorax is unremarkable. IMPRESSION: Large left-sided hydropneumothorax is noted. Electronically Signed: By: Marijo Conception M.D. On: 01/28/2021 13:36   EEG adult  Result Date: 01/29/2021 Lora Havens, MD     01/29/2021 12:04 PM Patient Name: Tami Stone MRN: [id number] Epilepsy Attending: Lora Havens Referring Physician/Provider: Dr Cherylann Ratel Date: 01/29/2021 Duration: 25 mins Patient history: 70yo F who presented with syncope. 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 EEG to evaluate for seizure. Level of alertness: Awake, drowsy AEDs during EEG study: LEV Technical aspects: This EEG study was done with scalp electrodes positioned according to the 10-20 International system of electrode placement. Electrical activity was acquired at a sampling rate of _0  and reviewed with a high frequency filter of _1  and a low frequency filter of _2 . EEG data were recorded continuously and digitally stored. Description: The posterior dominant rhythm consists of 9 Hz activity of moderate voltage (25-35 uV) seen predominantly in posterior head regions, symmetric and reactive to eye opening and eye closing. Drowsiness was characterized by attenuation of the posterior background rhythm. EEG showed intermittent generalized 3 to 6 Hz theta-delta slowing. Hyperventilation and photic stimulation were not performed.   ABNORMALITY - Intermittent slow, generalized IMPRESSION: This study is suggestive  of mild diffuse encephalopathy, nonspecific etiology. No seizures or epileptiform discharges were seen throughout the recording. Lora Havens      DVT prophylaxis: SCDs  Code Status: Full code  Family Communication: No family at  bedside   Consultants: PCCM Oncology  Procedures:     Objective    Physical Examination:   General-appears in no acute distress Heart-S1-S2, regular, no murmur auscultated Lungs-clear to auscultation bilaterally, no wheezing or crackles auscultated Abdomen-soft, nontender, no organomegaly Extremities-no edema in the lower extremities Neuro-alert, oriented x3, no focal deficit noted  Status is: Inpatient  Dispo: The patient is from: Home              Anticipated d/c is to: Home              Anticipated d/c date is: 01/31/2021              Patient currently not stable for discharge  Barrier to discharge-ongoing evaluation for seizure, brain metastasis  COVID-19 Labs  No results for input(s): DDIMER, FERRITIN, LDH, CRP in the last 72 hours.  Lab Results  Component Value Date   [place] NEGATIVE 01/28/2021   [place] NEGATIVE 01/18/2021   [place] NEGATIVE 04/29/2020    Microbiology  Recent Results (from the past 240 hour(s))  Culture, Respiratory w Gram Stain     Status: None   Collection Time: 01/20/21 11:20 AM   Specimen: Bronchoalveolar Lavage; Respiratory  Result Value Ref Range Status   Specimen Description   Final    BRONCHIAL ALVEOLAR LAVAGE LLL Performed at [facility] [address], [place], [place] [address]    Special Requests   Final    NONE Performed at [facility], [place] [address] [place], [place] [address]    Gram Stain   Final    RARE WBC PRESENT,BOTH PMN AND MONONUCLEAR NO ORGANISMS SEEN    Culture   Final    FEW GROUP B STREP(S.AGALACTIAE)ISOLATED TESTING AGAINST S. AGALACTIAE NOT ROUTINELY PERFORMED DUE TO PREDICTABILITY OF AMP/PEN/VAN SUSCEPTIBILITY. Performed at [facility], [place] [address] [place], [place] [address]    Report Status 01/22/2021 FINAL  Final  Resp Panel by RT-PCR (Flu A&B, Covid) Nasopharyngeal Swab     Status: None   Collection Time: 01/28/21  1:01 PM    Specimen: Nasopharyngeal Swab; Nasopharyngeal(NP) swabs in vial transport medium  Result Value Ref Range Status   SARS Coronavirus 2 by RT PCR NEGATIVE NEGATIVE Final    Comment: (NOTE) SARS-CoV-2 target nucleic acids are NOT DETECTED.  The SARS-CoV-2 RNA is generally detectable in upper respiratory specimens during the acute phase of infection. The lowest concentration of SARS-CoV-2 viral copies this assay can detect is 138 copies/mL. A negative result does not preclude SARS-Cov-2 infection and should not be used as the sole basis for treatment or other patient management decisions. A negative result may occur with  improper specimen collection/handling, submission of specimen other than nasopharyngeal swab, presence of viral mutation(s) within the areas targeted by this assay, and inadequate number of viral copies(<138 copies/mL). A negative result must be combined with clinical observations, patient history, and epidemiological information. The expected result is Negative.  Fact Sheet for Patients:  EntrepreneurPulse.com.au  Fact Sheet for Healthcare Providers:  IncredibleEmployment.be  This test is no t yet approved or cleared by the [place] FDA and  has been authorized for detection and/or diagnosis of SARS-CoV-2 by FDA under an Emergency Use Authorization (EUA). This EUA will remain  in effect (meaning this test can be used) for the duration of the COVID-19 declaration under Section 564(b)(1) of the Act, 21 U.S.C.section 360bbb-3(b)(1), unless the authorization is terminated  or revoked sooner.       Influenza A by PCR NEGATIVE NEGATIVE Final   Influenza B by PCR NEGATIVE NEGATIVE Final    Comment: (NOTE) The Xpert Xpress SARS-CoV-2/FLU/RSV plus assay is intended as an aid in the diagnosis of influenza from Nasopharyngeal swab specimens and should not be used as a sole basis for treatment. Nasal washings and aspirates are  unacceptable for Xpert Xpress SARS-CoV-2/FLU/RSV testing.  Fact Sheet for Patients: EntrepreneurPulse.com.au  Fact Sheet for Healthcare Providers: IncredibleEmployment.be  This test is not yet approved or cleared by the [place] FDA and has been authorized for detection and/or diagnosis of SARS-CoV-2 by FDA under an Emergency Use Authorization (EUA). This EUA will remain in effect (meaning this test can be used) for the duration of the COVID-19 declaration under Section 564(b)(1) of the Act, 21 U.S.C. section 360bbb-3(b)(1), unless the authorization is terminated or revoked.  Performed at [facility], [place] [address], [place], [place] [address]          Oswald Hillock   [facility] If 7PM-7AM, please contact night-coverage at [email or web address], Office  [phone]   01/29/2021, 4:07 PM  LOS: 0 days

## 2021-01-29 NOTE — ED Notes (Signed)
Pt moved to hospital stretcher for comfort. EEG at bedside.

## 2021-01-30 ENCOUNTER — Encounter (HOSPITAL_COMMUNITY): Payer: Self-pay | Admitting: Internal Medicine

## 2021-01-30 ENCOUNTER — Inpatient Hospital Stay (HOSPITAL_COMMUNITY): Payer: Medicare Other

## 2021-01-30 DIAGNOSIS — Z7189 Other specified counseling: Secondary | ICD-10-CM

## 2021-01-30 DIAGNOSIS — C7931 Secondary malignant neoplasm of brain: Secondary | ICD-10-CM | POA: Diagnosis not present

## 2021-01-30 DIAGNOSIS — R569 Unspecified convulsions: Secondary | ICD-10-CM | POA: Diagnosis not present

## 2021-01-30 DIAGNOSIS — R531 Weakness: Secondary | ICD-10-CM | POA: Diagnosis not present

## 2021-01-30 DIAGNOSIS — Z515 Encounter for palliative care: Secondary | ICD-10-CM

## 2021-01-30 DIAGNOSIS — J910 Malignant pleural effusion: Secondary | ICD-10-CM

## 2021-01-30 DIAGNOSIS — C3490 Malignant neoplasm of unspecified part of unspecified bronchus or lung: Secondary | ICD-10-CM | POA: Diagnosis not present

## 2021-01-30 DIAGNOSIS — J90 Pleural effusion, not elsewhere classified: Secondary | ICD-10-CM | POA: Diagnosis not present

## 2021-01-30 DIAGNOSIS — J948 Other specified pleural conditions: Secondary | ICD-10-CM | POA: Diagnosis not present

## 2021-01-30 LAB — BASIC METABOLIC PANEL
Anion gap: 9 (ref 5–15)
BUN: 10 mg/dL (ref 8–23)
CO2: 24 mmol/L (ref 22–32)
Calcium: 8.3 mg/dL — ABNORMAL LOW (ref 8.9–10.3)
Chloride: 101 mmol/L (ref 98–111)
Creatinine, Ser: 0.45 mg/dL (ref 0.44–1.00)
GFR, Estimated: >60 mL/min (ref >60)
Glucose, Bld: 128 mg/dL — ABNORMAL HIGH (ref 70–99)
Potassium: 4.2 mmol/L (ref 3.5–5.1)
Sodium: 134 mmol/L — ABNORMAL LOW (ref 135–145)

## 2021-01-30 LAB — CBC
HCT: 35.3 % — ABNORMAL LOW (ref 36.0–46.0)
Hemoglobin: 11.2 g/dL — ABNORMAL LOW (ref 12.0–15.0)
MCH: 27.4 pg (ref 26.0–34.0)
MCHC: 31.7 g/dL (ref 30.0–36.0)
MCV: 86.3 fL (ref 80.0–100.0)
Platelets: 323 10*3/uL (ref 150–400)
RBC: 4.09 MIL/uL (ref 3.87–5.11)
RDW: 14.9 % (ref 11.5–15.5)
WBC: 5.5 10*3/uL (ref 4.0–10.5)
nRBC: 0 % (ref 0.0–0.2)

## 2021-01-30 LAB — GLUCOSE, CAPILLARY
Glucose-Capillary: 123 mg/dL — ABNORMAL HIGH (ref 70–99)
Glucose-Capillary: 123 mg/dL — ABNORMAL HIGH (ref 70–99)
Glucose-Capillary: 125 mg/dL — ABNORMAL HIGH (ref 70–99)
Glucose-Capillary: 140 mg/dL — ABNORMAL HIGH (ref 70–99)

## 2021-01-30 LAB — HEMOGLOBIN A1C
Hgb A1c MFr Bld: 6.2 % — ABNORMAL HIGH (ref 4.8–5.6)
Mean Plasma Glucose: 131.24 mg/dL

## 2021-01-30 IMAGING — DX DG CHEST 1V PORT
1 series · 1 of 1 positions shown · non-contrast
Comparison: [DATE]

CLINICAL DATA: Diabetes, hydropneumothorax

EXAM:
PORTABLE CHEST 1 VIEW

[chest ap]
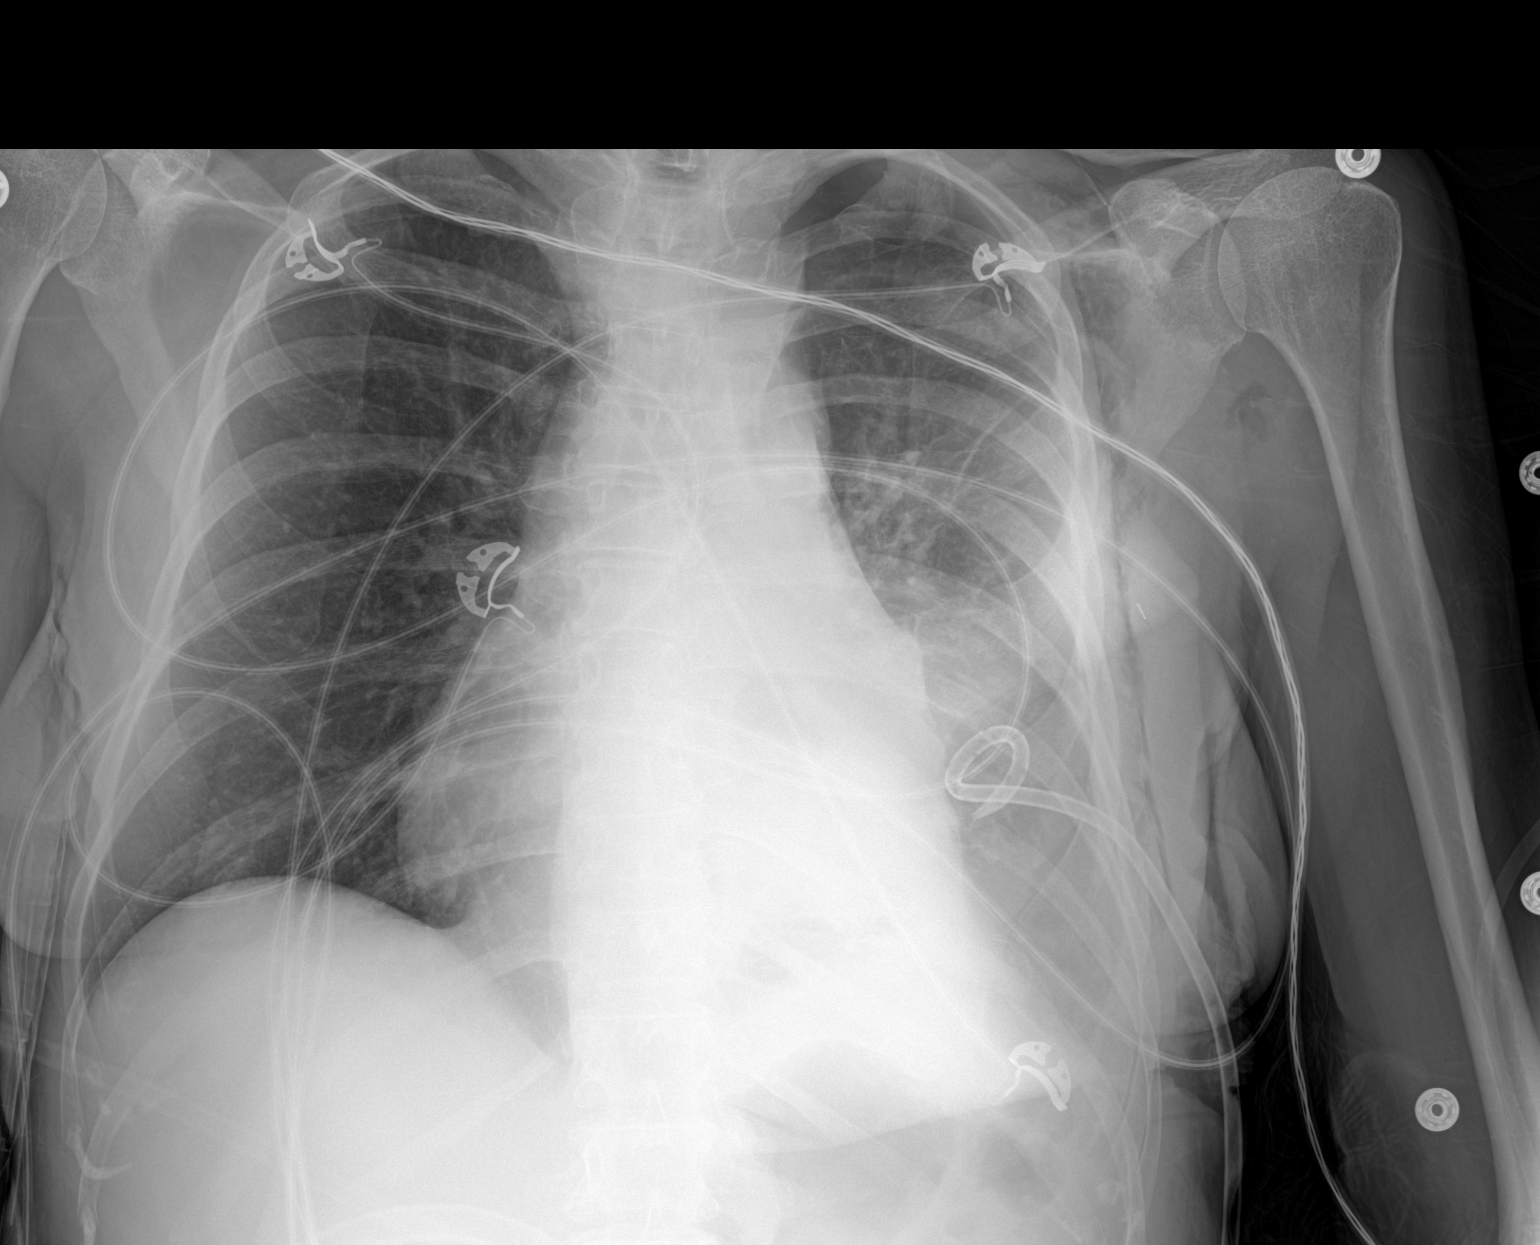

[1 of 1 positions shown; findings below may reference images not displayed]

FINDINGS: Left basilar pigtail chest tube is unchanged in position. Pulmonary
insufflation is stable. Mild left-sided volume loss is unchanged.
Opacification of the left lung base likely relates to residual
pleural fluid, similar to that noted on prior examination. Small
apical pneumothorax is unchanged. Subcutaneous gas is again seen
within the left chest wall. The right lung is clear. No pneumothorax
or pleural effusion on the right. Cardiac size is within normal
limits. Retrocardiac opacity is unchanged.
IMPRESSION: Stable small left hydropneumothorax. Left basilar chest tube is
unchanged.

Unchanged retrocardiac opacity corresponding to the known mass seen
on recent CT examination.

Stable pulmonary insufflation.

## 2021-01-30 MED ORDER — DABRAFENIB MESYLATE 50 MG PO CAPS
100.0000 mg | ORAL_CAPSULE | Freq: Two times a day (BID) | ORAL | Status: DC
  Administered 2021-01-30 – 2021-02-05 (×13): 100 mg via ORAL
  Filled 2021-01-30: qty 2

## 2021-01-30 MED ORDER — TRAMETINIB DIMETHYL SULFOXIDE 2 MG PO TABS
2.0000 mg | ORAL_TABLET | Freq: Every day | ORAL | Status: DC
Start: 2021-01-30 — End: 2021-02-05
  Administered 2021-01-30 – 2021-02-05 (×7): 2 mg via ORAL

## 2021-01-30 MED ORDER — METFORMIN HCL ER 750 MG PO TB24
750.0000 mg | ORAL_TABLET | Freq: Two times a day (BID) | ORAL | Status: DC
  Administered 2021-01-30 – 2021-02-05 (×12): 750 mg via ORAL
  Filled 2021-01-30 (×15): qty 1

## 2021-01-30 MED ORDER — LETROZOLE 2.5 MG PO TABS
2.5000 mg | ORAL_TABLET | Freq: Every day | ORAL | Status: DC
  Administered 2021-01-30 – 2021-02-05 (×7): 2.5 mg via ORAL
  Filled 2021-01-30 (×7): qty 1

## 2021-01-30 MED ORDER — ENSURE ENLIVE PO LIQD
237.0000 mL | Freq: Three times a day (TID) | ORAL | Status: DC
  Administered 2021-01-30 – 2021-02-05 (×12): 237 mL via ORAL

## 2021-01-30 MED ORDER — ACETAMINOPHEN 325 MG PO TABS: 650.0000 mg | ORAL_TABLET | Freq: Four times a day (QID) | ORAL | Status: DC | PRN

## 2021-01-30 MED ORDER — SENNA 8.6 MG PO TABS
1.0000 | ORAL_TABLET | Freq: Every day | ORAL | Status: DC
  Administered 2021-01-30 – 2021-02-05 (×4): 8.6 mg via ORAL
  Filled 2021-01-30 (×6): qty 1

## 2021-01-30 NOTE — Consult Note (Signed)
Consultation Note Date: 01/30/2021   Patient Name: Tami Stone  DOB: Feb 18, 1951  MRN: [id number]  Age / Sex: 70 y.o., female  PCP: Gaynelle Cage, MD Referring Physician: Modena Jansky, MD  Reason for Consultation: Establishing goals of care  HPI/Patient Profile: 70 y.o. female   admitted on 01/28/2021   Clinical Assessment and Goals of Care: 70 year old lady with metastatic adenocarcinoma of the lung, invasive ductal carcinoma of the breast and malignant pleural pericardial effusion presented with weakness.  Admitted to hospital medicine service and undergoing work-up for syncope/seizure and found to have new brain metastatic disease.  Being followed by medical oncology, restarting [place] today and is already on steroids.  Palliative medicine consultation has been requested for goals of care discussions.  Patient is awake alert resting in bed.  She complains of feeling dizzy and is wondering if she needs to be on IV fluids.  She is worried that she is not eating drinking enough.  I introduced myself and palliative care as follows: Palliative medicine is specialized medical care for people living with serious illness. It focuses on providing relief from the symptoms and stress of a serious illness. The goal is to improve quality of life for both the patient and the family. Goals of care: Broad aims of medical therapy in relation to the patient's values and preferences. Our aim is to provide medical care aimed at enabling patients to achieve the goals that matter most to them, given the circumstances of their particular medical situation and their constraints.   Brief life review performed.  Patient is originally from [place], [place].  She states that she lives at home with her husband and daughter. We discussed briefly about the burden of this illness on her life and her overall quality  of life. Patient states that she has faith and she wishes to take things "one day at a a time" and that she will only focus on what she can control and try not to think about things she cannot control. Offered active listening and supportive presence. See below.   NEXT OF KIN    SUMMARY OF RECOMMENDATIONS    Full Code, full scope care for now.  Patient wishes to obtain more information about the plan for addressing her brain metastatic disease from radiation oncology for radiosurgery.   Code Status/Advance Care Planning: Full code   Symptom Management:     Palliative Prophylaxis:  Delirium Protocol  Additional Recommendations (Limitations, Scope, Preferences): Full Scope Treatment  Psycho-social/Spiritual:  Desire for further Chaplaincy support:yes Additional Recommendations: Caregiving  Support/Resources  Prognosis:  Unable to determine  Discharge Planning: To Be Determined      Primary Diagnoses: Present on Admission:  Syncope   I have reviewed the medical record, interviewed the patient and family, and examined the patient. The following aspects are pertinent.  Past Medical History:  Diagnosis Date   Diabetes ([place])    High cholesterol    Lung cancer ([place])    04/2020   Social History  Socioeconomic History   Marital status: Married    Spouse name: Not on file   Number of children: Not on file   Years of education: Not on file   Highest education level: Not on file  Occupational History   Not on file  Tobacco Use   Smoking status: Never   Smokeless tobacco: Never  Vaping Use   Vaping Use: Never used  Substance and Sexual Activity   Alcohol use: Not Currently   Drug use: Never   Sexual activity: Not on file  Other Topics Concern   Not on file  Social History Narrative   Not on file   Social Determinants of Health   Financial Resource Strain: Not on file  Food Insecurity: Not on file  Transportation Needs: Not on file  Physical Activity: Not  on file  Stress: Not on file  Social Connections: Not on file   Family History  Problem Relation Age of Onset   Breast cancer Sister        half sister on maternal side   Scheduled Meds:  Chlorhexidine Gluconate Cloth  6 each Topical Daily   dabrafenib mesylate  100 mg Oral BID   dexamethasone  8 mg Oral Q12H   dronabinol  5 mg Oral BID AC   feeding supplement  237 mL Oral TID BM   letrozole  2.5 mg Oral Daily   metFORMIN  750 mg Oral BID WC   senna  1 tablet Oral Daily   sodium chloride flush  10 mL Intracatheter Q8H   sodium chloride flush  3 mL Intravenous Q12H   thyroid  60 mg Oral Daily   trametinib dimethyl sulfoxide  2 mg Oral Daily   Continuous Infusions:  sodium chloride 10 mL/hr at 01/29/21 0905   levETIRAcetam Stopped (01/30/21 0940)   PRN Meds:.acetaminophen, methocarbamol, morphine injection, ondansetron (ZOFRAN) IV Medications Prior to Admission:  Prior to Admission medications   Medication Sig Start Date End Date Taking? Authorizing Provider  Coenzyme Q10 (CO Q 10 PO) Take 300 mg by mouth at bedtime.   Yes [provider]  dabrafenib mesylate (TAFLINAR) 50 MG capsule Take 100 mg by mouth 2 (two) times daily. Take on an empty stomach 1 hour before or 2 hours after meals.   Yes [provider]  DANDELION PO Take 1 capsule by mouth daily.   Yes [provider]  dronabinol (MARINOL) 5 MG capsule Take 1 capsule (5 mg total) by mouth 2 (two) times daily before a meal. 01/25/21  Yes Ennever, Rudell Cobb, MD  ibuprofen (ADVIL) 200 MG tablet Take 400 mg by mouth every 6 (six) hours as needed for mild pain.   Yes [provider]  IVERMECTIN PO Take 1.8 mLs by mouth daily. Ivermectin oil - 10 mg/ml   Yes [provider]  letrozole (FEMARA) 2.5 MG tablet Take 2.5 mg by mouth daily. 12/22/20  Yes [provider]  metFORMIN (GLUCOPHAGE-XR) 750 MG 24 hr tablet Take 750 mg by mouth 2 (two) times daily.   Yes [provider]   Misc Natural Products (MAGIC MUSHROOM MIX) CAPS Take 1 tablet by mouth daily.   Yes [provider]  Multiple Vitamins-Minerals (MULTIVITAMIN WITH MINERALS) tablet Take 1 tablet by mouth daily.   Yes [provider]  NP THYROID 60 MG tablet Take 60 mg by mouth daily. 02/24/20  Yes [provider]  OVER THE COUNTER MEDICATION Take 2 capsules by mouth 3 (three) times daily. Protectival  Yes [provider]  polyethylene glycol (MIRALAX / GLYCOLAX) 17 g packet Take 17 g by mouth daily. Patient taking differently: Take 17 g by mouth daily as needed for moderate constipation. 01/23/21  Yes Georgette Shell, MD  trametinib dimethyl sulfoxide (MEKINIST) 2 MG tablet Take 1 tablet (2 mg total) by mouth daily. Take 1 hour before or 2 hours after a meal. Store refrigerated in original container. 06/02/20  Yes Curt Bears, MD  Zinc 100 MG TABS Take 100 mg by mouth daily.   Yes [provider]   No Known Allergies Review of Systems Complains of feeling dizzy currently.   Physical Exam Awake alert resting in bed No acute distress Diminished breath sounds left base also has a left-sided chest tube S1-S2 monitor is on Abdomen not tender Awake alert oriented no focal deficits  Vital Signs: BP 91/66 (BP Location: Right Arm)   Pulse 95   Temp 97.9 F (36.6 C)   Resp 19   Ht 5\' 2"  (1.575 m)   Wt 60 kg   SpO2 100%   BMI 24.19 kg/m  Pain Scale: 0-10   Pain Score: 0-No pain   SpO2: SpO2: 100 % O2 Device:SpO2: 100 % O2 Flow Rate: .   IO: Intake/output summary:  Intake/Output Summary (Last 24 hours) at 01/30/2021 1615 Last data filed at 01/30/2021 1540 Gross per 24 hour  Intake 553.33 ml  Output 1020 ml  Net -466.67 ml    LBM: Last BM Date:  (PTA) Baseline Weight: Weight: 60 kg Most recent weight: Weight: 60 kg     Palliative Assessment/Data:   PPS 50%  Time In:  1520 Time Out:  1620 Time Total:  60  Greater than 50%  of this  time was spent counseling and coordinating care related to the above assessment and plan.  Signed by: Loistine Chance, MD   Please contact Palliative Medicine Team phone at [phone] for questions and concerns.  For individual provider: See [name]

## 2021-01-30 NOTE — Progress Notes (Signed)
Initial Nutrition Assessment  DOCUMENTATION CODES:   Not applicable, suspect malnutrition but unable to confirm without NFPE  INTERVENTION:   - Ensure Enlive po TID, each supplement provides 350 kcal and 20 grams of protein (chocolate flavor)  - MVI with minerals daily  - Encourage PO intake  NUTRITION DIAGNOSIS:   Inadequate oral intake related to poor appetite as evidenced by per patient/family report.  GOAL:   Patient will meet greater than or equal to 90% of their needs  MONITOR:   PO intake, Supplement acceptance, Weight trends, I & O's  REASON FOR ASSESSMENT:   Malnutrition Screening Tool, Consult Assessment of nutrition requirement/status  ASSESSMENT:   70 year old female who presented to the ED on 8/18 after syncopal episode. PMH of stage IV metastatic lung cancer, stage III breast cancer, known left malignant pleural effusion, DM. Pt admitted with new hydropneumothorax.  8/18 - s/p chest tube placement  RD working remotely.  Per notes, pt now with brain metastasis. Oncology considering radiosurgery. Noted Palliative Care consult in place.  Pt with recent admission earlier this month. During this admission, pt was seen by an RD. Pt diagnosed with severe malnutrition at this time. Suspect malnutrition persists but unable to confirm without NFPE.  Per review of notes, pt reports weight loss from 164 lbs to 119 lbs since November 2021. Reviewed weight history in chart. Pt with a total of weight loss of 12.8 kg since 04/28/20. This is a 17.6% weight loss in 9 months which is severe and significant for timeframe.   Spoke with pt via phone call to room. Pt reports that she is feeling better today. She reports decreased appetite that began back in November 2021 which is also when pt started losing weight. Pt reports that she typically eats smaller, more frequent meals throughout the day. A meal may include oatmeal with fruit and honey or hard-boiled eggs. Pt reports that  over the last few days PTA, she was feeling very poorly and hardly eating at all. Pt believes she might have lost even more weight during this time.  Pt states that she likes to eat healthy foods and feels like she hasn't been able to get what she likes at the hospital. RD made pt "with assist" so that she will have help with meal ordering. Pt amenable to consuming oral nutrition supplements during admission. RD to order Ensure Enlive as well as daily MVI with minerals.  Meal Completion: 100% x 1 documented meal  Medications reviewed and include: decadron, marinol, metformin, IV keppra  Labs reviewed: sodium 134 CBG's: 123  UOP: 650 ml x 12 hours Chest tube: 220 ml x 24 hours I/O's: -1.7 L since admit  NUTRITION - FOCUSED PHYSICAL EXAM:  Unable to complete at this time. RD working remotely.  Diet Order:   Diet Order             Diet regular Room service appropriate? Yes; Fluid consistency: Thin  Diet effective now                   EDUCATION NEEDS:   Education needs have been addressed  Skin:  Skin Assessment: Reviewed RN Assessment  Last BM:  no documented BM  Height:   Ht Readings from Last 1 Encounters:  01/29/21 5\' 2"  (1.575 m)    Weight:   Wt Readings from Last 1 Encounters:  01/28/21 60 kg    BMI:  Body mass index is 24.19 kg/m.  Estimated Nutritional Needs:   Kcal:  1800-2000  Protein:  85-100 grams  Fluid:  1.8-2.0 L    Gustavus Bryant, MS, RD, LDN Inpatient Clinical Dietitian Please see AMiON for contact information.

## 2021-01-30 NOTE — Progress Notes (Signed)
Tami Stone is now in the ICU.  She sounds good.  She feels good.  She is not wearing oxygen.  Her oxygen level is 98%.  She had a chest x-ray this morning.  Showed a small stable hydropneumothorax on the left.  I talked her about the brain metastasis that she now has.  And these are quite small.  She is on dexamethasone.  I think she would be a very good candidate for radiosurgery.  These lesions are very tiny.  By radiosurgery, we should be able to cause good eradication.  She really needs to start the [place].  Hopefully, she will get this today.  I put the order in for the nurses to give her the medication.  Her labs show white cell count 5.5.  Hemoglobin 11.2.  Platelet count 323,000.  Her blood sugar is 128.  Her creatinine is 0.45.  She is not having any counter pain issues.  She is having some constipation.  She would like to have food brought in.  She says that her family was told that they cannot bring food in for her.  Maybe this is a ruled out in the ICU??  Her vital signs all look stable.  Temperature 98.1.  Pulse 82.  Blood pressure 105/69.  There is no neurological changes.  Cardiac exam is regular rate and rhythm.  Her lungs sound pretty clear bilaterally.  I do not hear any wheezes.  Abdomen is soft.  I do think that immunotherapy, if we get it started in the office, will help with the CNS metastasis.  Again, hopefully, she will be able to get back onto the [place] and Tafinlar today.  I appreciate the great care that she will get from the hard-working staff in the ICU.  Tami Haw, MD  Jenny Reichmann 3:16

## 2021-01-30 NOTE — Progress Notes (Signed)
[name], MRN:  [id number], DOB:  April 17, 1951, LOS: 1 ADMISSION DATE:  01/28/2021, CONSULTATION DATE:  8/18 REFERRING MD:  Dr. Alvino Chapel, CHIEF COMPLAINT:  SOB   History of Present Illness:  70 y/o F who presented to [facility] ER on 8/18 with reports of syncope.   The patient was recently admitted from 8/8-8/12 for at least a one month duration of shortness of breath with dry cough.  She was found to have a large left pleural effusion with mediastinal shift to the right.  She was admitted and underwent a thoracentesis completed by IR with 2.4L fluid removed.  Cytology consistent with malignant cells consistent with metastatic adenocarcinoma.   She is followed by Dr. Arnoldo Morale at [facility] from Oncology & Dr. Marin Olp as second opinion. She also follows with an integrative medicine specialist.  She has been using liquid Ivermectin for her cancer.    The patient reports she returned to the ER for what she describes as weakness & nausea.  She reports she has had intermittent fevers at home.  She notes weight loss of 164 lbs down to 119 since her diagnosis in November 2021.  Her daughter has been in visiting from [place] to help care for her and reported the patient became lightheaded and then was altered for approximately 45 seconds with tremors.  The patient has no memory of the events. EMS was activated.  On arrival she had soft normal blood pressures.  She was given IVF.  CT head was evaluated and showed a new ill defined enhancement in the left frontal lobe concerning for possible mass.  CXR showed a large left-sided hydropneumothorax.    PCCM consulted for evaluation of hydropneumothorax.   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 Little sore at tube insertion site. CXR unchanged, lung not inflated. 220 cc out from tube.  Objective   Blood pressure 105/69, pulse 82, temperature 98.1 F (36.7 C), temperature source Axillary, resp. rate (!) 28, height 5' 2"  (1.575 m), weight 60 kg, SpO2 98 %.        Intake/Output Summary (Last 24 hours) at 01/30/2021 3536 Last data filed at 01/30/2021 0640 Gross per 24 hour  Intake 460 ml  Output 830 ml  Net -370 ml    Filed Weights   01/28/21 1228  Weight: 60 kg    Examination: General:  thin adult female sitting up in chair in NAD HEENT: MM pink/moist, no jvd, anicteric  Neuro: AAOx4, speech clear, MAE  CV: s1s2 RRR, no m/r/g PULM: non-labored at rest, improved air movement on left, clear on right, left chest tube with clear yellow drainage GI: soft, bsx4 active  Extremities: warm/dry, no edema  Skin: no rashes or lesions  Resolved Hospital Problem list     Assessment & Plan:   Left Hydropneumothorax: Presumed secondary spontaneous PTX due to cancer. May have been slow leak post thora 8/11. Largely asymptomatic prior to presentation from a  pulmonary standpoint.  Doubt syncopal episode related given no shift on CXR. Lung not re-expanded. Remains on suction. Lung appears trapped -continue chest tube, continue suction -follow up CXR in am  -will remove chest tube prior to discharge, discussed role of heimlich valve, continuing at home and she does not want to do this after discussing risks and benefits -chest tube care per protocol   GOC: New brain mets. Indicated she was not sure if interested in ongoing  therapy -Palliative Care consult   Best Practice (right click and "Reselect all SmartList Selections" daily)  Per Primary    Critical care time: n/a    Lanier Clam, MD See Amion for contact info

## 2021-01-30 NOTE — Progress Notes (Addendum)
[facility]  PROGRESS NOTE  Tami Stone INO:[id number] DOB: 1951-01-07 DOA: 01/28/2021 PCP: Gaynelle Cage, MD   Brief HPI:   70 year old female with history of metastatic adenocarcinoma of the lung, invasive ductal carcinoma of the breast, malignant pleural effusion, pericardial effusion presented with weakness and also passed out with tremors at home.  Chest x-ray showed large hydropneumothorax.  PCCM was consulted and left chest tube was placed.  CT head showed ill-defined hypodensity in the frontal region.  MRI brain showed small multiple metastasis with mild surrounding edema.  PCCM and oncology consulting.    Subjective   Denies seizure-like activity since admission.  Reports that she has not been out of bed much since hospitalization.  Also does not like hospital food and was wondering if her family can bring food from outside.  No other complaints reported.  Wondering when she can go home.   Assessment/Plan:    Syncope/?  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 twice daily;  -EEG 8/19: Mild diffuse encephalopathy, nonspecific etiology.  No seizures or epileptiform discharges seen. -Initiated Decadron 8 mg p.o. twice daily, discussed with Dr. Marin Olp -It appears that patient's case has been discussed with radiation oncology for radiosurgery of brain mets.  Dr. Darrick Meigs is back tomorrow and can follow-up on this. -Prior to discharge, patient must be counseled regarding driving and other restrictions for 6 months. -Oncology follow-up appreciated, restarting Mcekinist & Tafinlar today.  Malignant left pleural effusion -Chest x-ray showed left-sided hydropneumothorax -Chest tube placed per PCCM -Chest x-ray 8/20: Stable small left hydropneumothorax. -I discussed with Dr. Larey Days, PCCM.  Suspect trapped lung and low likelihood of  expansion.  Recommends continued chest tube until time of discharge.  Patient declines Heimlich valve. -Remains on room air and not hypoxic. -As per PCCM, recent admission 8/8-01/2011 for dyspnea and cough, noted to have large left pleural effusion with midline shift to the right, s/p 2.4 L thoracentesis by IR, cytology consistent with malignant cells-metastatic adenocarcinoma.  Normocytic anemia -Stable.  Hyponatremia -Serum sodium stable in the low 130s. 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  No clinical or echo evidence of pericardial tamponade -Asymptomatic -Continue to monitor  Hypothyroidism -Continue Armour Thyroid  Type II DM No recent A1c in, requested.  Currently on metformin and seems reasonably controlled.  Check CBG before meals and at bedtime and consider SSI if persistently elevated CBGs  Possible dysphagia Per nursing, patient reported some swallowing difficulties.  Requested speech therapy evaluation.   Scheduled medications:    Chlorhexidine Gluconate Cloth  6 each Topical Daily   dabrafenib mesylate  100 mg Oral BID   dexamethasone  8 mg Oral Q12H   dronabinol  5 mg Oral BID AC   feeding supplement  237 mL Oral TID BM   letrozole  2.5 mg Oral Daily   metFORMIN  750 mg Oral BID WC   sodium chloride flush  10 mL Intracatheter Q8H   sodium chloride flush  3 mL Intravenous Q12H   thyroid  60 mg Oral Daily   trametinib dimethyl sulfoxide  2 mg Oral Daily         Data Reviewed:   CBG:  Recent Labs  Lab 01/30/21 0634 01/30/21 0807  GLUCAP 123* 123*    SpO2: 99 %    Vitals:   01/30/21 0600 01/30/21  7253 01/30/21 0800 01/30/21 1000  BP: (!) 81/56 105/69 102/72 98/60  Pulse: 78 82 78 82  Resp: 18 (!) 28 (!) 22 (!) 32  Temp:   97.8 F (36.6 C)   TempSrc:   Oral   SpO2: 96% 98% 99% 99%  Weight:      Height:         Intake/Output Summary (Last 24 hours) at 01/30/2021 1141 Last  data filed at 01/30/2021 1055 Gross per 24 hour  Intake 553.33 ml  Output 830 ml  Net -276.67 ml     08/18 1901 - 08/20 0700 In: 470 [P.O.:360; I.V.:10] Out: 1220 [Urine:650]  Filed Weights   01/28/21 1228  Weight: 60 kg    CBC:  Recent Labs  Lab 01/28/21 1301 01/29/21 0737 01/30/21 0210  WBC 8.6 6.1 5.5  HGB 10.4* 10.1* 11.2*  HCT 32.6* 31.9* 35.3*  PLT 329 305 323  MCV 86.9 86.2 86.3  MCH 27.7 27.3 27.4  MCHC 31.9 31.7 31.7  RDW 15.7* 15.5 14.9  LYMPHSABS 0.7  --   --   MONOABS 0.3  --   --   EOSABS 0.0  --   --   BASOSABS 0.0  --   --      Complete metabolic panel:  Recent Labs  Lab 01/28/21 1301 01/29/21 0737 01/30/21 0210  NA 133* 132* 134*  K 3.8 3.7 4.2  CL 100 100 101  CO2 26 26 24   GLUCOSE 143* 108* 128*  BUN 22 14 10   CREATININE 0.64 0.50 0.45  CALCIUM 8.0* 7.8* 8.3*  AST 18 15  --   ALT 9 10  --   ALKPHOS 79 72  --   BILITOT 0.5 0.2*  --   ALBUMIN 2.5* 2.3*  --       ------------------------------------------------------------------------------------------------------------------    Component Value Date/Time   BNP 16.6 01/18/2021 1400     Antibiotics: Anti-infectives (From admission, onward)    None        Radiology Reports  CT HEAD WO CONTRAST (5MM)  Result Date: 01/28/2021 CLINICAL DATA:  Seizure EXAM: CT HEAD WITHOUT CONTRAST TECHNIQUE: Contiguous axial images were obtained from the base of the skull through the vertex without intravenous contrast. COMPARISON:  Brain MRI 05/19/2020 FINDINGS: Brain: There is ill-defined hypodensity in the left frontal lobe subcortical white matter along the orbital gyrus (2-10, 4-15, 5-36). There is no evidence of acute intracranial hemorrhage or territorial infarct. The ventricles are not enlarged. There is no midline shift. Vascular: No hyperdense vessel or unexpected calcification. Skull: Normal. Negative for fracture or focal lesion. Sinuses/Orbits: The imaged paranasal sinuses are  clear. The imaged globes and orbits are unremarkable. Other: The mastoid air cells are clear. IMPRESSION: Ill-defined enhancement in the left frontal lobe as above. 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 Electronically Signed   By: Valetta Mole M.D.   On: 01/28/2021 14:26   MR Brain W and Wo Contrast  Result Date: 01/28/2021 CLINICAL DATA:  Seizure, nontraumatic (Age >= 55y). EXAM: MRI HEAD WITHOUT AND WITH CONTRAST TECHNIQUE: Multiplanar, multiecho pulse sequences of the brain and surrounding structures were obtained without and with intravenous contrast. CONTRAST:  63mL GADAVIST GADOBUTROL 1 MMOL/ML IV SOLN COMPARISON:  Head CT 01/28/2021. FINDINGS: Brain: Cerebral volume is normal. There are multiple enhancing intracranial lesions compatible with intracranial metastatic disease, as follows. 4 mm enhancing lesion within the right frontal lobe  with mild surrounding edema (series 16, image 96). 3 mm enhancing lesion within the posterior left frontal lobe with mild surrounding edema (series 16, image 98). 6 mm enhancing lesion within the anterior left frontal lobe with mild surrounding edema (series 16, image 67). 2 mm enhancing lesion within the left parietal lobe with mild surrounding edema (series 16, image 91). 2 mm enhancing lesion within the right cerebellar hemisphere (series 16, image 29). 2 mm enhancing lesion within the left cerebellar hemisphere (series 16, image 18). Mild multifocal T2/FLAIR hyperintensity elsewhere within the cerebral white matter and pons is nonspecific, but compatible chronic small vessel ischemic disease. There is a punctate focus of apparent diffusion weighted hyperintensity within the right cerebellum appreciated on the axial diffusion-weighted imaging only (series 5, image 55). No chronic intracranial blood products. No extra-axial fluid collection. No midline shift. Vascular:  Maintained flow voids within the proximal large arterial vessels. Skull and upper cervical spine: No focal suspicious marrow lesion. Sinuses/Orbits: Visualized orbits show no acute finding. Trace bilateral ethmoid sinus mucosal thickening. IMPRESSION: At least six enhancing metastatic lesions are identified within the supratentorial and infratentorial brain, measuring up to 4 mm, some with mild surrounding edema, as detailed. Punctate focus of diffusion weighted signal abnormality within the medial right cerebellum, appreciated on the axial diffusion-weighted sequence only. This may reflect an acute infarct, a poorly enhancing metastasis or artifact. Background mild chronic small vessel ischemic changes within the cerebral white matter and pons. Electronically Signed   By: Kellie Simmering D.O.   On: 01/28/2021 18:03   DG CHEST PORT 1 VIEW  Result Date: 01/30/2021 CLINICAL DATA:  Diabetes, hydropneumothorax EXAM: PORTABLE CHEST 1 VIEW COMPARISON:  01/29/2021 FINDINGS: Left basilar pigtail chest tube is unchanged in position. Pulmonary insufflation is stable. Mild left-sided volume loss is unchanged. Opacification of the left lung base likely relates to residual pleural fluid, similar to that noted on prior examination. Small apical pneumothorax is unchanged. Subcutaneous gas is again seen within the left chest wall. The right lung is clear. No pneumothorax or pleural effusion on the right. Cardiac size is within normal limits. Retrocardiac opacity is unchanged. IMPRESSION: Stable small left hydropneumothorax. Left basilar chest tube is unchanged. Unchanged retrocardiac opacity corresponding to the known mass seen on recent CT examination. Stable pulmonary insufflation. Electronically Signed   By: Fidela Salisbury M.D.   On: 01/30/2021 00:27   DG CHEST PORT 1 VIEW  Result Date: 01/29/2021 CLINICAL DATA:  Chest tube with hydropneumothorax EXAM: PORTABLE CHEST 1 VIEW COMPARISON:  01/28/2021 FINDINGS: Left chest tube  in place. Left apical pneumothorax reaching the fourth posterior rib. Soft tissue emphysema. Hazy density with probable fissural component at the left base is unchanged. Known left lower lobe mass. Clear right lung and stable heart size. IMPRESSION: Unchanged left hydropneumothorax and lower lobe opacification. Electronically Signed   By: Monte Fantasia M.D.   On: 01/29/2021 05:45   DG CHEST PORT 1 VIEW  Result Date: 01/28/2021 CLINICAL DATA:  Status post chest tube placement EXAM: PORTABLE CHEST 1 VIEW COMPARISON:  Film from earlier in the same day. FINDINGS: Pigtail catheter is now seen on the left with significant reduction in pleural effusion. The lung is incompletely re-expanded with both an apical and inferolateral pneumothorax component. These changes are likely related to incomplete re-expansion of the lung as opposed to a true pneumothorax. This is related to the longstanding pleural effusion. Right lung is clear. Cardiac shadow is within normal limits. IMPRESSION: Status post chest tube placement with  significant reduction in left-sided pleural effusion. There remains left lung consolidation and incomplete re-expansion as described related to the longstanding nature of the effusion as opposed to a true pneumothorax. Follow-up films are recommended. Electronically Signed   By: Inez Catalina M.D.   On: 01/28/2021 17:01   DG Chest Portable 1 View  Addendum Date: 01/28/2021   ADDENDUM REPORT: 01/28/2021 13:43 ADDENDUM: Critical Value/emergent results were called by telephone at the time of interpretation on 01/28/2021 at 1:36 pm to provider Davonna Belling , who verbally acknowledged these results. Electronically Signed   By: Marijo Conception M.D.   On: 01/28/2021 13:43   Result Date: 01/28/2021 CLINICAL DATA:  Syncope. EXAM: PORTABLE CHEST 1 VIEW COMPARISON:  January 21, 2021. FINDINGS: Stable cardiomediastinal silhouette. Right lung is clear. Left apical pneumothorax is noted with large pleural  effusion. Bony thorax is unremarkable. IMPRESSION: Large left-sided hydropneumothorax is noted. Electronically Signed: By: Marijo Conception M.D. On: 01/28/2021 13:36   EEG adult  Result Date: 01/29/2021 Lora Havens, MD     01/29/2021 12:04 PM Patient Name: Tami Stone MRN: [id number] Epilepsy Attending: Lora Havens Referring Physician/Provider: Dr Cherylann Ratel Date: 01/29/2021 Duration: 25 mins Patient history: 70yo F who presented with syncope. 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 EEG to evaluate for seizure. Level of alertness: Awake, drowsy AEDs during EEG study: LEV Technical aspects: This EEG study was done with scalp electrodes positioned according to the 10-20 International system of electrode placement. Electrical activity was acquired at a sampling rate of 500Hz  and reviewed with a high frequency filter of 70Hz  and a low frequency filter of 1Hz . EEG data were recorded continuously and digitally stored. Description: The posterior dominant rhythm consists of 9 Hz activity of moderate voltage (25-35 uV) seen predominantly in posterior head regions, symmetric and reactive to eye opening and eye closing. Drowsiness was characterized by attenuation of the posterior background rhythm. EEG showed intermittent generalized 3 to 6 Hz theta-delta slowing. Hyperventilation and photic stimulation were not performed.   ABNORMALITY - Intermittent slow, generalized IMPRESSION: This study is suggestive of mild diffuse encephalopathy, nonspecific etiology. No seizures or epileptiform discharges were seen throughout the recording. Lora Havens      DVT prophylaxis: SCDs  Code Status: Full code  Family Communication: Discussed in detail with patient's spouse via phone, updated care and answered all questions.  He was dissatisfied with the hospital visitors  policy.   Consultants: PCCM Oncology  Procedures: Left chest tube by PCCM.    Objective    Physical Examination:   General exam: Pleasant elderly female, moderately built and nourished lying comfortably propped up in bed without distress.  Remains on room air. Respiratory system: Diminished breath sounds in the left base.  Rest of lung fields clear to auscultation.  Left-sided chest tube connected to suction.  Respiratory effort normal. Cardiovascular system: S1 & S2 heard, RRR. No JVD, murmurs, rubs, gallops or clicks. No pedal edema.  Telemetry personally reviewed: Sinus rhythm. Gastrointestinal system: Abdomen is nondistended, soft and nontender. No organomegaly or masses felt. Normal bowel sounds heard. Central nervous system: Alert and oriented. No focal neurological deficits. Extremities: Symmetric 5 x 5 power. Skin: No rashes, lesions or ulcers Psychiatry: Judgement and insight appear normal. Mood & affect appropriate.    Status is: Inpatient  Dispo: The patient is from: Home  Anticipated d/c is to: Home              Anticipated d/c date is: 01/31/2021              Patient currently not stable for discharge  Barrier to discharge-ongoing evaluation for seizure, brain metastasis  COVID-19 Labs  No results for input(s): DDIMER, FERRITIN, LDH, CRP in the last 72 hours.  Lab Results  Component Value Date   [place] NEGATIVE 01/28/2021   [place] NEGATIVE 01/18/2021   [place] NEGATIVE 04/29/2020    Microbiology  Recent Results (from the past 240 hour(s))  Resp Panel by RT-PCR (Flu A&B, Covid) Nasopharyngeal Swab     Status: None   Collection Time: 01/28/21  1:01 PM   Specimen: Nasopharyngeal Swab; Nasopharyngeal(NP) swabs in vial transport medium  Result Value Ref Range Status   SARS Coronavirus 2 by RT PCR NEGATIVE NEGATIVE Final    Comment: (NOTE) SARS-CoV-2 target nucleic acids are NOT DETECTED.  The SARS-CoV-2 RNA is generally  detectable in upper respiratory specimens during the acute phase of infection. The lowest concentration of SARS-CoV-2 viral copies this assay can detect is 138 copies/mL. A negative result does not preclude SARS-Cov-2 infection and should not be used as the sole basis for treatment or other patient management decisions. A negative result may occur with  improper specimen collection/handling, submission of specimen other than nasopharyngeal swab, presence of viral mutation(s) within the areas targeted by this assay, and inadequate number of viral copies(<138 copies/mL). A negative result must be combined with clinical observations, patient history, and epidemiological information. The expected result is Negative.  Fact Sheet for Patients:  EntrepreneurPulse.com.au  Fact Sheet for Healthcare Providers:  IncredibleEmployment.be  This test is no t yet approved or cleared by the [place] FDA and  has been authorized for detection and/or diagnosis of SARS-CoV-2 by FDA under an Emergency Use Authorization (EUA). This EUA will remain  in effect (meaning this test can be used) for the duration of the COVID-19 declaration under Section 564(b)(1) of the Act, 21 U.S.C.section 360bbb-3(b)(1), unless the authorization is terminated  or revoked sooner.       Influenza A by PCR NEGATIVE NEGATIVE Final   Influenza B by PCR NEGATIVE NEGATIVE Final    Comment: (NOTE) The Xpert Xpress SARS-CoV-2/FLU/RSV plus assay is intended as an aid in the diagnosis of influenza from Nasopharyngeal swab specimens and should not be used as a sole basis for treatment. Nasal washings and aspirates are unacceptable for Xpert Xpress SARS-CoV-2/FLU/RSV testing.  Fact Sheet for Patients: EntrepreneurPulse.com.au  Fact Sheet for Healthcare Providers: IncredibleEmployment.be  This test is not yet approved or cleared by the [place] FDA  and has been authorized for detection and/or diagnosis of SARS-CoV-2 by FDA under an Emergency Use Authorization (EUA). This EUA will remain in effect (meaning this test can be used) for the duration of the COVID-19 declaration under Section 564(b)(1) of the Act, 21 U.S.C. section 360bbb-3(b)(1), unless the authorization is terminated or revoked.  Performed at [facility], [place] [address] [place], [place] [address]   MRSA Next Gen by PCR, Nasal     Status: None   Collection Time: 01/29/21  6:53 PM   Specimen: Nasal Mucosa; Nasal Swab  Result Value Ref Range Status   MRSA by PCR Next Gen NOT DETECTED NOT DETECTED Final    Comment: (NOTE) The GeneXpert MRSA Assay (FDA approved for NASAL specimens only), is one component of a comprehensive MRSA colonization surveillance program.  It is not intended to diagnose MRSA infection nor to guide or monitor treatment for MRSA infections. Test performance is not FDA approved in patients less than 1 years old. Performed at [facility], [place] [address], [place], [place] [address]          Vernell Leep, MD, [place], [facility]. [facility]  To contact the attending provider between 7A-7P or the covering provider during after hours 7P-7A, please log into the web site [email or web address] and access using universal [place] password for that web site. If you do not have the password, please call the hospital operator.   01/30/2021, 11:41 AM  LOS: 1 day

## 2021-01-30 NOTE — Progress Notes (Signed)
Pt notified of transfer to 4 floor. Pt stable at time. Family at bedside. Report given to 4E nurse.

## 2021-01-30 NOTE — Plan of Care (Signed)
  Problem: Health Behavior/Discharge Planning: Goal: Ability to manage health-related needs will improve Outcome: Progressing   Problem: Clinical Measurements: Goal: Ability to maintain clinical measurements within normal limits will improve Outcome: Progressing Goal: Diagnostic test results will improve Outcome: Progressing Goal: Respiratory complications will improve Outcome: Progressing Goal: Cardiovascular complication will be avoided Outcome: Progressing   

## 2021-01-31 ENCOUNTER — Inpatient Hospital Stay (HOSPITAL_COMMUNITY): Payer: Medicare Other

## 2021-01-31 DIAGNOSIS — C7931 Secondary malignant neoplasm of brain: Secondary | ICD-10-CM | POA: Diagnosis not present

## 2021-01-31 DIAGNOSIS — J948 Other specified pleural conditions: Secondary | ICD-10-CM | POA: Diagnosis not present

## 2021-01-31 DIAGNOSIS — C799 Secondary malignant neoplasm of unspecified site: Secondary | ICD-10-CM | POA: Diagnosis not present

## 2021-01-31 DIAGNOSIS — R569 Unspecified convulsions: Secondary | ICD-10-CM | POA: Diagnosis not present

## 2021-01-31 LAB — GLUCOSE, CAPILLARY
Glucose-Capillary: 130 mg/dL — ABNORMAL HIGH (ref 70–99)
Glucose-Capillary: 106 mg/dL — ABNORMAL HIGH (ref 70–99)
Glucose-Capillary: 90 mg/dL (ref 70–99)
Glucose-Capillary: 155 mg/dL — ABNORMAL HIGH (ref 70–99)
Glucose-Capillary: 120 mg/dL — ABNORMAL HIGH (ref 70–99)

## 2021-01-31 IMAGING — DX DG CHEST 1V PORT
1 series · 1 of 1 positions shown · non-contrast
Comparison: the previous day's study

CLINICAL DATA: Lt sided chest tube

EXAM:
PORTABLE CHEST - 1 VIEW

[chest ap]
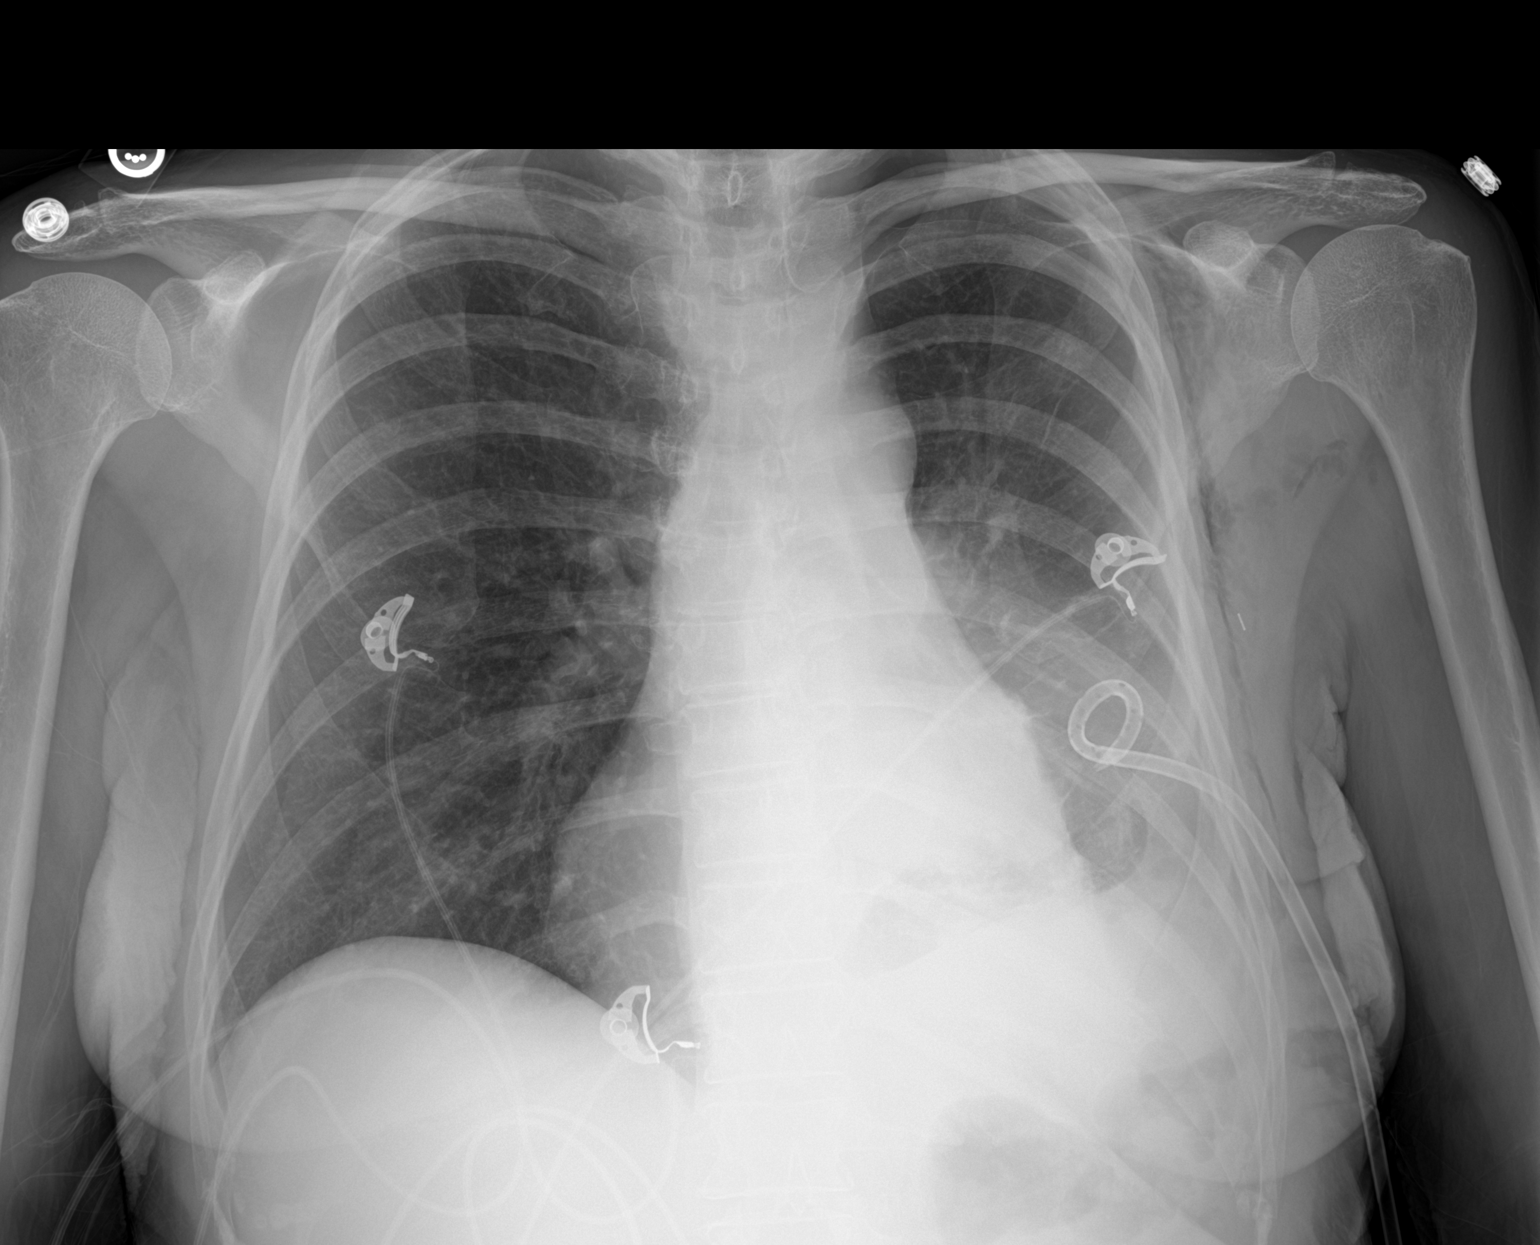

[1 of 1 positions shown; findings below may reference images not displayed]

FINDINGS: Left pigtail chest tube stable. Persistent left apical pneumothorax,
lung apex projecting inferior to the posterior aspect left third rib
as before. Consolidation/atelectasis in the left lower lung.

Heart size and mediastinal contours are within normal limits.

Possible layering left pleural effusion.  Right lung clear.

Visualized bones unremarkable.
IMPRESSION: 1. Stable left chest tube with persistent apical pneumothorax.
2. Persistent left lower lung consolidation/atelectasis with
possible effusion.

## 2021-01-31 NOTE — Plan of Care (Signed)
  Problem: Health Behavior/Discharge Planning: Goal: Ability to manage health-related needs will improve Outcome: Progressing   Problem: Activity: Goal: Risk for activity intolerance will decrease Outcome: Progressing   Problem: Safety: Goal: Ability to remain free from injury will improve Outcome: Progressing   Problem: Pain Managment: Goal: General experience of comfort will improve Outcome: Progressing

## 2021-01-31 NOTE — Plan of Care (Signed)
°  Problem: Education: °Goal: Knowledge of General Education information will improve °Description: Including pain rating scale, medication(s)/side effects and non-pharmacologic comfort measures °Outcome: Progressing °  °Problem: Clinical Measurements: °Goal: Ability to maintain clinical measurements within normal limits will improve °Outcome: Progressing °Goal: Respiratory complications will improve °Outcome: Progressing °Goal: Cardiovascular complication will be avoided °Outcome: Progressing °  °Problem: Activity: °Goal: Risk for activity intolerance will decrease °Outcome: Progressing °  °Problem: Nutrition: °Goal: Adequate nutrition will be maintained °Outcome: Progressing °  °

## 2021-01-31 NOTE — Evaluation (Signed)
Clinical/Bedside Swallow Evaluation Patient Details  Name: Tami Stone MRN: [id number] Date of Birth: [birth year]  Today's Date: 01/31/2021 Time: SLP Start Time (ACUTE ONLY): 1440 SLP Stop Time (ACUTE ONLY): 1452 SLP Time Calculation (min) (ACUTE ONLY): 12 min  Past Medical History:  Past Medical History:  Diagnosis Date   Diabetes ([place])    High cholesterol    Lung cancer ([place])    04/2020   Past Surgical History:  Past Surgical History:  Procedure Laterality Date   BREAST BIOPSY Right    BRONCHIAL BIOPSY  04/30/2020   Procedure: BRONCHIAL BIOPSIES;  Surgeon: Collene Gobble, MD;  Location: [facility];  Service: Cardiopulmonary;;   BRONCHIAL BRUSHINGS  04/30/2020   Procedure: BRONCHIAL BRUSHINGS;  Surgeon: Collene Gobble, MD;  Location: [facility];  Service: Cardiopulmonary;;   BRONCHIAL BRUSHINGS  01/20/2021   Procedure: BRONCHIAL BRUSHINGS;  Surgeon: Laurin Coder, MD;  Location: [facility];  Service: Endoscopy;;   BRONCHIAL WASHINGS  01/20/2021   Procedure: BRONCHIAL WASHINGS;  Surgeon: Laurin Coder, MD;  Location: [facility];  Service: Endoscopy;;   HEMOSTASIS CONTROL  04/30/2020   Procedure: HEMOSTASIS CONTROL;  Surgeon: Collene Gobble, MD;  Location: [facility];  Service: Cardiopulmonary;;   VIDEO BRONCHOSCOPY N/A 04/30/2020   Procedure: VIDEO BRONCHOSCOPY WITH FLUORO;  Surgeon: Collene Gobble, MD;  Location: [facility];  Service: Cardiopulmonary;  Laterality: N/A;   VIDEO BRONCHOSCOPY N/A 01/20/2021   Procedure: VIDEO BRONCHOSCOPY WITHOUT FLUORO;  Surgeon: Laurin Coder, MD;  Location: [facility];  Service: Endoscopy;  Laterality: N/A;   HPI:  Tami Stone is a 70 y.o. female with medical history significant of S4 adenocarcinoma of the lung, S3 invasive ductal carcinoma of breast, malignant pleural effusion, pericardial effusion. Presenting with weakness and loss of apetite.  Pt has had significant weight loss since November 2021 and  is being followed by RD here.   Assessment / Plan / Recommendation Clinical Impression  Pt was seen for a bedside swallow evaluation and she presents with overall functional oropharyngeal swallowing abilities.  Pt denied dysphagia, but stated that food has felt "stuck" in her esophagus for the past few months.  No hx of GERD.  Pt was seen with trials of thin liquid and regular solids.  Delayed throat clear observed in 1/5 trials of thin liquid and 1/4 trials of regular solids, otherwise no overt s/sx of aspiration or difficulty.  Recommend regular solids and thin liquids with consideration of GI evaluation.  Pt was educated regarding recommendations including: 1) Sit upright as possible 2) Remain upright for 30+ minutes following meals 3) Start meals with a warm drink 4) Alternate bites/sips.  Pt verbalized understanding.  No further skilled ST is warranted at this time.  Please re-consult if additional needs arise.  SLP Visit Diagnosis: Dysphagia, unspecified (R13.10)    Aspiration Risk  Mild aspiration risk    Diet Recommendation Regular;Thin liquid   Liquid Administration via: Cup;Straw Medication Administration: Whole meds with liquid Supervision: Patient able to self feed Compensations: Slow rate;Small sips/bites;Follow solids with liquid Postural Changes: Seated upright at 90 degrees;Remain upright for at least 30 minutes after po intake    Other  Recommendations Oral Care Recommendations: Oral care BID Other Recommendations: GI consult  Follow up Recommendations None        Swallow Study   General HPI: Katlynn Naser is a 70 y.o. female with medical history significant of S4 adenocarcinoma of the lung, S3 invasive ductal carcinoma of breast, malignant pleural effusion, pericardial  effusion. Presenting with weakness and loss of apetite.  Pt has had significant weight loss since November 2021 and is being followed by RD here. Type of Study: Bedside Swallow Evaluation Previous  Swallow Assessment: None Diet Prior to this Study: Regular;Thin liquids Temperature Spikes Noted: No Respiratory Status: Room air History of Recent Intubation: No Behavior/Cognition: Alert;Cooperative;Pleasant mood Oral Cavity Assessment: Within Functional Limits Oral Care Completed by SLP: No Oral Cavity - Dentition: Dentures, top Vision: Functional for self-feeding Self-Feeding Abilities: Able to feed self Patient Positioning: Upright in bed Baseline Vocal Quality: Low vocal intensity Volitional Cough: Strong Volitional Swallow: Able to elicit    Oral/Motor/Sensory Function Overall Oral Motor/Sensory Function: Within functional limits   Ice Chips Ice chips: Not tested   Thin Liquid Thin Liquid: Impaired Presentation: Straw Pharyngeal  Phase Impairments: Throat Clearing - Delayed    Nectar Thick Nectar Thick Liquid: Not tested   Honey Thick Honey Thick Liquid: Not tested   Puree Puree: Not tested   Solid     Solid: Impaired Presentation: Self Fed Pharyngeal Phase Impairments: Throat Clearing - Delayed     Tami Stone M.S., CCC-SLP Acute Rehabilitation Services Office: [phone]  Tami Stone 01/31/2021,3:00 PM

## 2021-01-31 NOTE — Progress Notes (Signed)
[facility]  PROGRESS NOTE  Tami Stone HEN:[id number] DOB: [birth year] DOA: 01/28/2021 PCP: Gaynelle Cage, MD   Brief HPI:   70 year old female with history of metastatic adenocarcinoma of the lung, invasive ductal carcinoma of the breast, malignant pleural effusion, pericardial effusion presented with weakness.  Also passed out with tremors at home.  Chest x-ray showed large hydropneumothorax.  PCCM was consulted and chest tube was placed.  CT head showed ill-defined hypodensity in the frontal region.  MRI brain showed small multiple metastasis with mild surrounding edema.    Subjective   Patient seen and examined, denies any weakness.  No overnight seizures.   Assessment/Plan:    Syncope/?  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 twice daily; obtained EEG-mild diffuse encephalopathy, nonspecific etiology.  No seizures or epileptiform discharges seen. -Also started on Decadron 8 mg p.o. twice daily, discussed with Dr. Marin Olp -Dr. Marin Olp  discussed with radiation oncology for radiosurgery for brain mets  Malignant pleural effusion -Chest x-ray showed left-sided hydropneumothorax -Chest tube placed per PCCM -Dr. Silas Flood recommends continuing chest tube till discharge. -As per PCCM, recent admission 8/8-01/2011 for dyspnea and cough, noted to have large left pleural effusion with midline shift to the right, s/p 2.4 L thoracentesis by IR, cytology consistent with malignant cells-metastatic adenocarcinoma.  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   History of pericardial effusion -Seen on echo on 01/18/2021 -Asymptomatic -Continue to monitor  Hypothyroidism -Continue NP thyroid  Diabetes mellitus type  2 -CBG has been well controlled; hemoglobin A1c 6.2 -Continue metformin -Patient wants to continue metformin in the hospital   Scheduled medications:    Chlorhexidine Gluconate Cloth  6 each Topical Daily   dabrafenib mesylate  100 mg Oral BID   dexamethasone  8 mg Oral Q12H   dronabinol  5 mg Oral BID AC   feeding supplement  237 mL Oral TID BM   letrozole  2.5 mg Oral Daily   metFORMIN  750 mg Oral BID WC   senna  1 tablet Oral Daily   sodium chloride flush  10 mL Intracatheter Q8H   sodium chloride flush  3 mL Intravenous Q12H   thyroid  60 mg Oral Daily   trametinib dimethyl sulfoxide  2 mg Oral Daily         Data Reviewed:   CBG:  Recent Labs  Lab 01/30/21 1618 01/30/21 2039 01/31/21 0532 01/31/21 0727 01/31/21 1119  GLUCAP 125* 140* 130* 106* 90    SpO2: 90 %    Vitals:   01/30/21 2133 01/31/21 0127 01/31/21 0518 01/31/21 1251  BP: 98/64 109/72 129/87 110/73  Pulse: 88 88 82 97  Resp: 20 20 20 20   Temp: 97.8 F (36.6 C) 98.3 F (36.8 C) 97.9 F (36.6 C) 97.8 F (36.6 C)  TempSrc:    Oral  SpO2: 97% 100% 100% 90%  Weight:   53.7 kg   Height:         Intake/Output Summary (Last 24 hours) at 01/31/2021 1446 Last data filed at 01/31/2021 1245 Gross per 24 hour  Intake 1769.33 ml  Output 230 ml  Net 1539.33 ml    08/19 1901 - 08/21 0700 In: 2249.7 [P.O.:840; I.V.:1096.3] Out: [place] Weights   01/28/21 1228 01/31/21 0518  Weight: 60 kg 53.7 kg    CBC:  Recent Labs  Lab 01/28/21 1301 01/29/21 0737 01/30/21 0210  WBC 8.6 6.1 5.5  HGB 10.4* 10.1* 11.2*  HCT 32.6* 31.9* 35.3*  PLT 329 305 323  MCV 86.9 86.2 86.3  MCH 27.7 27.3 27.4  MCHC 31.9 31.7 31.7  RDW 15.7* 15.5 14.9  LYMPHSABS 0.7  --   --   MONOABS 0.3  --   --   EOSABS 0.0  --   --   BASOSABS 0.0  --   --     Complete metabolic panel:  Recent Labs  Lab 01/28/21 1301 01/29/21 0737 01/30/21 0210 01/30/21 1158  NA 133* 132* 134*  --   K 3.8 3.7  4.2  --   CL 100 100 101  --   CO2 26 26 24   --   GLUCOSE 143* 108* 128*  --   BUN 22 14 10   --   CREATININE 0.64 0.50 0.45  --   CALCIUM 8.0* 7.8* 8.3*  --   AST 18 15  --   --   ALT 9 10  --   --   ALKPHOS 79 72  --   --   BILITOT 0.5 0.2*  --   --   ALBUMIN 2.5* 2.3*  --   --   HGBA1C  --   --   --  6.2*    No results for input(s): LIPASE, AMYLASE in the last 168 hours.  Recent Labs  Lab 01/28/21 1301  SARSCOV2NAA NEGATIVE    ------------------------------------------------------------------------------------------------------------------ No results for input(s): CHOL, HDL, LDLCALC, TRIG, CHOLHDL, LDLDIRECT in the last 72 hours.  Lab Results  Component Value Date   HGBA1C 6.2 (H) 01/30/2021   ------------------------------------------------------------------------------------------------------------------ No results for input(s): TSH, T4TOTAL, T3FREE, THYROIDAB in the last 72 hours.  Invalid input(s): FREET3 ------------------------------------------------------------------------------------------------------------------ No results for input(s): VITAMINB12, FOLATE, FERRITIN, TIBC, IRON, RETICCTPCT in the last 72 hours.  Coagulation profile No results for input(s): INR, PROTIME in the last 168 hours. No results for input(s): DDIMER in the last 72 hours.  Cardiac Enzymes No results for input(s): CKTOTAL, CKMB, CKMBINDEX, TROPONINI in the last 168 hours.  ------------------------------------------------------------------------------------------------------------------    Component Value Date/Time   BNP 16.6 01/18/2021 1400     Antibiotics: Anti-infectives (From admission, onward)    None        Radiology Reports  DG CHEST PORT 1 VIEW  Result Date: 01/31/2021 CLINICAL DATA:  Lt sided chest tube EXAM: PORTABLE CHEST - 1 VIEW COMPARISON:  the previous day's study FINDINGS: Left pigtail chest tube stable. Persistent left apical pneumothorax, lung apex  projecting inferior to the posterior aspect left third rib as before. Consolidation/atelectasis in the left lower lung. Heart size and mediastinal contours are within normal limits. Possible layering left pleural effusion.  Right lung clear. Visualized bones unremarkable. IMPRESSION: 1. Stable left chest tube with persistent apical pneumothorax. 2. Persistent left lower lung consolidation/atelectasis with possible effusion. Electronically Signed   By: Lucrezia Europe M.D.   On: 01/31/2021 12:12   DG CHEST PORT 1 VIEW  Result Date: 01/30/2021 CLINICAL DATA:  Diabetes, hydropneumothorax EXAM: PORTABLE CHEST 1 VIEW COMPARISON:  01/29/2021 FINDINGS: Left basilar pigtail chest tube is unchanged in position. Pulmonary insufflation is stable. Mild left-sided volume loss is unchanged. Opacification of the left lung base likely relates to residual pleural fluid, similar to that noted on prior examination. Small apical pneumothorax is unchanged. Subcutaneous gas is again seen within the left chest  wall. The right lung is clear. No pneumothorax or pleural effusion on the right. Cardiac size is within normal limits. Retrocardiac opacity is unchanged. IMPRESSION: Stable small left hydropneumothorax. Left basilar chest tube is unchanged. Unchanged retrocardiac opacity corresponding to the known mass seen on recent CT examination. Stable pulmonary insufflation. Electronically Signed   By: Fidela Salisbury M.D.   On: 01/30/2021 00:27      DVT prophylaxis: SCDs  Code Status: Full code  Family Communication: No family at bedside   Consultants: PCCM Oncology  Procedures:     Objective    Physical Examination:   General-appears in no acute distress Heart-S1-S2, regular, no murmur auscultated Lungs-clear to auscultation bilaterally, no wheezing or crackles auscultated Abdomen-soft, nontender, no organomegaly Extremities-no edema in the lower extremities Neuro-alert, oriented x3, no focal deficit  noted  Status is: Inpatient  Dispo: The patient is from: Home              Anticipated d/c is to: Home              Anticipated d/c date is: 02/03/2021              Patient currently not stable for discharge  Barrier to discharge-ongoing evaluation for seizure, brain metastasis  COVID-19 Labs  No results for input(s): DDIMER, FERRITIN, LDH, CRP in the last 72 hours.  Lab Results  Component Value Date   [place] NEGATIVE 01/28/2021   [place] NEGATIVE 01/18/2021   [place] NEGATIVE 04/29/2020    Microbiology  Recent Results (from the past 240 hour(s))  Resp Panel by RT-PCR (Flu A&B, Covid) Nasopharyngeal Swab     Status: None   Collection Time: 01/28/21  1:01 PM   Specimen: Nasopharyngeal Swab; Nasopharyngeal(NP) swabs in vial transport medium  Result Value Ref Range Status   SARS Coronavirus 2 by RT PCR NEGATIVE NEGATIVE Final    Comment: (NOTE) SARS-CoV-2 target nucleic acids are NOT DETECTED.  The SARS-CoV-2 RNA is generally detectable in upper respiratory specimens during the acute phase of infection. The lowest concentration of SARS-CoV-2 viral copies this assay can detect is 138 copies/mL. A negative result does not preclude SARS-Cov-2 infection and should not be used as the sole basis for treatment or other patient management decisions. A negative result may occur with  improper specimen collection/handling, submission of specimen other than nasopharyngeal swab, presence of viral mutation(s) within the areas targeted by this assay, and inadequate number of viral copies(<138 copies/mL). A negative result must be combined with clinical observations, patient history, and epidemiological information. The expected result is Negative.  Fact Sheet for Patients:  EntrepreneurPulse.com.au  Fact Sheet for Healthcare Providers:  IncredibleEmployment.be  This test is no t yet approved or cleared by the [place] FDA and   has been authorized for detection and/or diagnosis of SARS-CoV-2 by FDA under an Emergency Use Authorization (EUA). This EUA will remain  in effect (meaning this test can be used) for the duration of the COVID-19 declaration under Section 564(b)(1) of the Act, 21 U.S.C.section 360bbb-3(b)(1), unless the authorization is terminated  or revoked sooner.       Influenza A by PCR NEGATIVE NEGATIVE Final   Influenza B by PCR NEGATIVE NEGATIVE Final    Comment: (NOTE) The Xpert Xpress SARS-CoV-2/FLU/RSV plus assay is intended as an aid in the diagnosis of influenza from Nasopharyngeal swab specimens and should not be used as a sole basis for treatment. Nasal washings and aspirates are unacceptable for Xpert Xpress SARS-CoV-2/FLU/RSV testing.  Fact Sheet  for Patients: EntrepreneurPulse.com.au  Fact Sheet for Healthcare Providers: IncredibleEmployment.be  This test is not yet approved or cleared by the [place] FDA and has been authorized for detection and/or diagnosis of SARS-CoV-2 by FDA under an Emergency Use Authorization (EUA). This EUA will remain in effect (meaning this test can be used) for the duration of the COVID-19 declaration under Section 564(b)(1) of the Act, 21 U.S.C. section 360bbb-3(b)(1), unless the authorization is terminated or revoked.  Performed at [facility], [place] [address] [place], [place] [address]   MRSA Next Gen by PCR, Nasal     Status: None   Collection Time: 01/29/21  6:53 PM   Specimen: Nasal Mucosa; Nasal Swab  Result Value Ref Range Status   MRSA by PCR Next Gen NOT DETECTED NOT DETECTED Final    Comment: (NOTE) The GeneXpert MRSA Assay (FDA approved for NASAL specimens only), is one component of a comprehensive MRSA colonization surveillance program. It is not intended to diagnose MRSA infection nor to guide or monitor treatment for MRSA infections. Test performance is not FDA  approved in patients less than 38 years old. Performed at [facility], [place] [address], [place], [place] [address]          Oswald Hillock   [facility] If 7PM-7AM, please contact night-coverage at [email or web address], Office  [phone]   01/31/2021, 2:46 PM  LOS: 2 days

## 2021-01-31 NOTE — Progress Notes (Signed)
NAMEDawnya Stone, MRN:  [id number], DOB:  [birth year], LOS: 2 ADMISSION DATE:  01/28/2021, CONSULTATION DATE:  8/18 REFERRING MD:  Dr. Alvino Chapel, CHIEF COMPLAINT:  SOB   History of Present Illness:  70 y/o F who presented to [facility] ER on 8/18 with reports of syncope.   The patient was recently admitted from 8/8-8/12 for at least a one month duration of shortness of breath with dry cough.  She was found to have a large left pleural effusion with mediastinal shift to the right.  She was admitted and underwent a thoracentesis completed by IR with 2.4L fluid removed.  Cytology consistent with malignant cells consistent with metastatic adenocarcinoma.   She is followed by Dr. Arnoldo Morale at [facility] from Oncology & Dr. Marin Olp as second opinion. She also follows with an integrative medicine specialist.  She has been using liquid Ivermectin for her cancer.    The patient reports she returned to the ER for what she describes as weakness & nausea.  She reports she has had intermittent fevers at home.  She notes weight loss of 164 lbs down to 119 since her diagnosis in November 2021.  Her daughter has been in visiting from [place] to help care for her and reported the patient became lightheaded and then was altered for approximately 45 seconds with tremors.  The patient has no memory of the events. EMS was activated.  On arrival she had soft normal blood pressures.  She was given IVF.  CT head was evaluated and showed a new ill defined enhancement in the left frontal lobe concerning for possible mass.  CXR showed a large left-sided hydropneumothorax.    PCCM consulted for evaluation of hydropneumothorax.   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  Objective   Blood pressure 110/73, pulse 97, temperature 97.8 F (36.6 C), temperature source Oral, resp. rate 20, height 5' 2"  (1.575 m), weight 53.7 kg, SpO2 90 %.        Intake/Output Summary (Last 24 hours) at 01/31/2021 1419 Last data filed at 01/31/2021 1245 Gross per 24 hour  Intake 1769.33 ml  Output 230 ml  Net 1539.33 ml    Filed Weights   01/28/21 1228 01/31/21 0518  Weight: 60 kg 53.7 kg    Examination: General:  thin adult female sitting up in chair in NAD HEENT: MM pink/moist, no jvd, anicteric  Neuro: AAOx4, speech clear, MAE  CV: s1s2 RRR, no m/r/g PULM: non-labored at rest,  left chest tube with clear yellow drainage GI: soft, bsx4 active  Extremities: warm/dry, no edema  Skin: no rashes or lesions  Resolved Hospital Problem list     Assessment & Plan:   Left Hydropneumothorax: Presumed secondary spontaneous PTX due to cancer. May have been slow leak post thora 8/11, PTX ex vacuo. Largely asymptomatic prior to presentation from a pulmonary  standpoint.  Doubt syncopal episode related given no shift on CXR. Lung not re-expanded. Remains on suction. Lung appears trapped. Unlikely to expand. No air leak at time of evaluation. -continue chest tube, continue suction -follow up CXR in am  -will remove chest tube prior to discharge, discussed role of heimlich valve, continuing at home and she does not want to do this after discussing risks and benefits -chest tube care per protocol  -Discussed role of Pleurx catheter to  managing malignant pleural effusion, she will contemplate and continue discussed with Dr. Marin Olp  Best Practice (right click and "Reselect all SmartList Selections" daily)  Per Primary    Critical care time: n/a    Lanier Clam, MD See Amion for contact info

## 2021-02-01 ENCOUNTER — Inpatient Hospital Stay (HOSPITAL_COMMUNITY): Payer: Medicare Other

## 2021-02-01 ENCOUNTER — Ambulatory Visit
Admit: 2021-02-01 | Discharge: 2021-02-01 | Disposition: A | Discharge status: Home / Self Care | Payer: Medicare Other | Source: Ambulatory Visit | Attending: Radiation Oncology | Admitting: Radiation Oncology

## 2021-02-01 DIAGNOSIS — C7931 Secondary malignant neoplasm of brain: Secondary | ICD-10-CM

## 2021-02-01 DIAGNOSIS — Z9689 Presence of other specified functional implants: Secondary | ICD-10-CM

## 2021-02-01 DIAGNOSIS — Z170 Estrogen receptor positive status [ER+]: Secondary | ICD-10-CM

## 2021-02-01 DIAGNOSIS — C3492 Malignant neoplasm of unspecified part of left bronchus or lung: Secondary | ICD-10-CM

## 2021-02-01 DIAGNOSIS — C799 Secondary malignant neoplasm of unspecified site: Secondary | ICD-10-CM | POA: Diagnosis not present

## 2021-02-01 DIAGNOSIS — C50512 Malignant neoplasm of lower-outer quadrant of left female breast: Secondary | ICD-10-CM

## 2021-02-01 DIAGNOSIS — R55 Syncope and collapse: Secondary | ICD-10-CM | POA: Diagnosis not present

## 2021-02-01 DIAGNOSIS — C3490 Malignant neoplasm of unspecified part of unspecified bronchus or lung: Secondary | ICD-10-CM | POA: Diagnosis not present

## 2021-02-01 DIAGNOSIS — J948 Other specified pleural conditions: Secondary | ICD-10-CM | POA: Diagnosis not present

## 2021-02-01 DIAGNOSIS — J90 Pleural effusion, not elsewhere classified: Secondary | ICD-10-CM | POA: Diagnosis not present

## 2021-02-01 LAB — CBC
HCT: 34 % — ABNORMAL LOW (ref 36.0–46.0)
Hemoglobin: 10.5 g/dL — ABNORMAL LOW (ref 12.0–15.0)
MCH: 27.3 pg (ref 26.0–34.0)
MCHC: 30.9 g/dL (ref 30.0–36.0)
MCV: 88.5 fL (ref 80.0–100.0)
Platelets: 381 10*3/uL (ref 150–400)
RBC: 3.84 MIL/uL — ABNORMAL LOW (ref 3.87–5.11)
RDW: 15.2 % (ref 11.5–15.5)
WBC: 7.3 10*3/uL (ref 4.0–10.5)
nRBC: 0 % (ref 0.0–0.2)

## 2021-02-01 LAB — GLUCOSE, CAPILLARY
Glucose-Capillary: 141 mg/dL — ABNORMAL HIGH (ref 70–99)
Glucose-Capillary: 102 mg/dL — ABNORMAL HIGH (ref 70–99)
Glucose-Capillary: 94 mg/dL (ref 70–99)
Glucose-Capillary: 171 mg/dL — ABNORMAL HIGH (ref 70–99)
Glucose-Capillary: 105 mg/dL — ABNORMAL HIGH (ref 70–99)

## 2021-02-01 LAB — BASIC METABOLIC PANEL
Anion gap: 8 (ref 5–15)
BUN: 24 mg/dL — ABNORMAL HIGH (ref 8–23)
CO2: 26 mmol/L (ref 22–32)
Calcium: 8.5 mg/dL — ABNORMAL LOW (ref 8.9–10.3)
Chloride: 104 mmol/L (ref 98–111)
Creatinine, Ser: 0.55 mg/dL (ref 0.44–1.00)
GFR, Estimated: >60 mL/min (ref >60)
Glucose, Bld: 129 mg/dL — ABNORMAL HIGH (ref 70–99)
Potassium: 4.5 mmol/L (ref 3.5–5.1)
Sodium: 138 mmol/L (ref 135–145)

## 2021-02-01 IMAGING — DX DG CHEST 1V
1 series · 1 of 1 positions shown · non-contrast
Comparison: [DATE] and CT chest [DATE].

CLINICAL DATA: Left chest tube.

EXAM:
CHEST  1 VIEW

[chest ap]
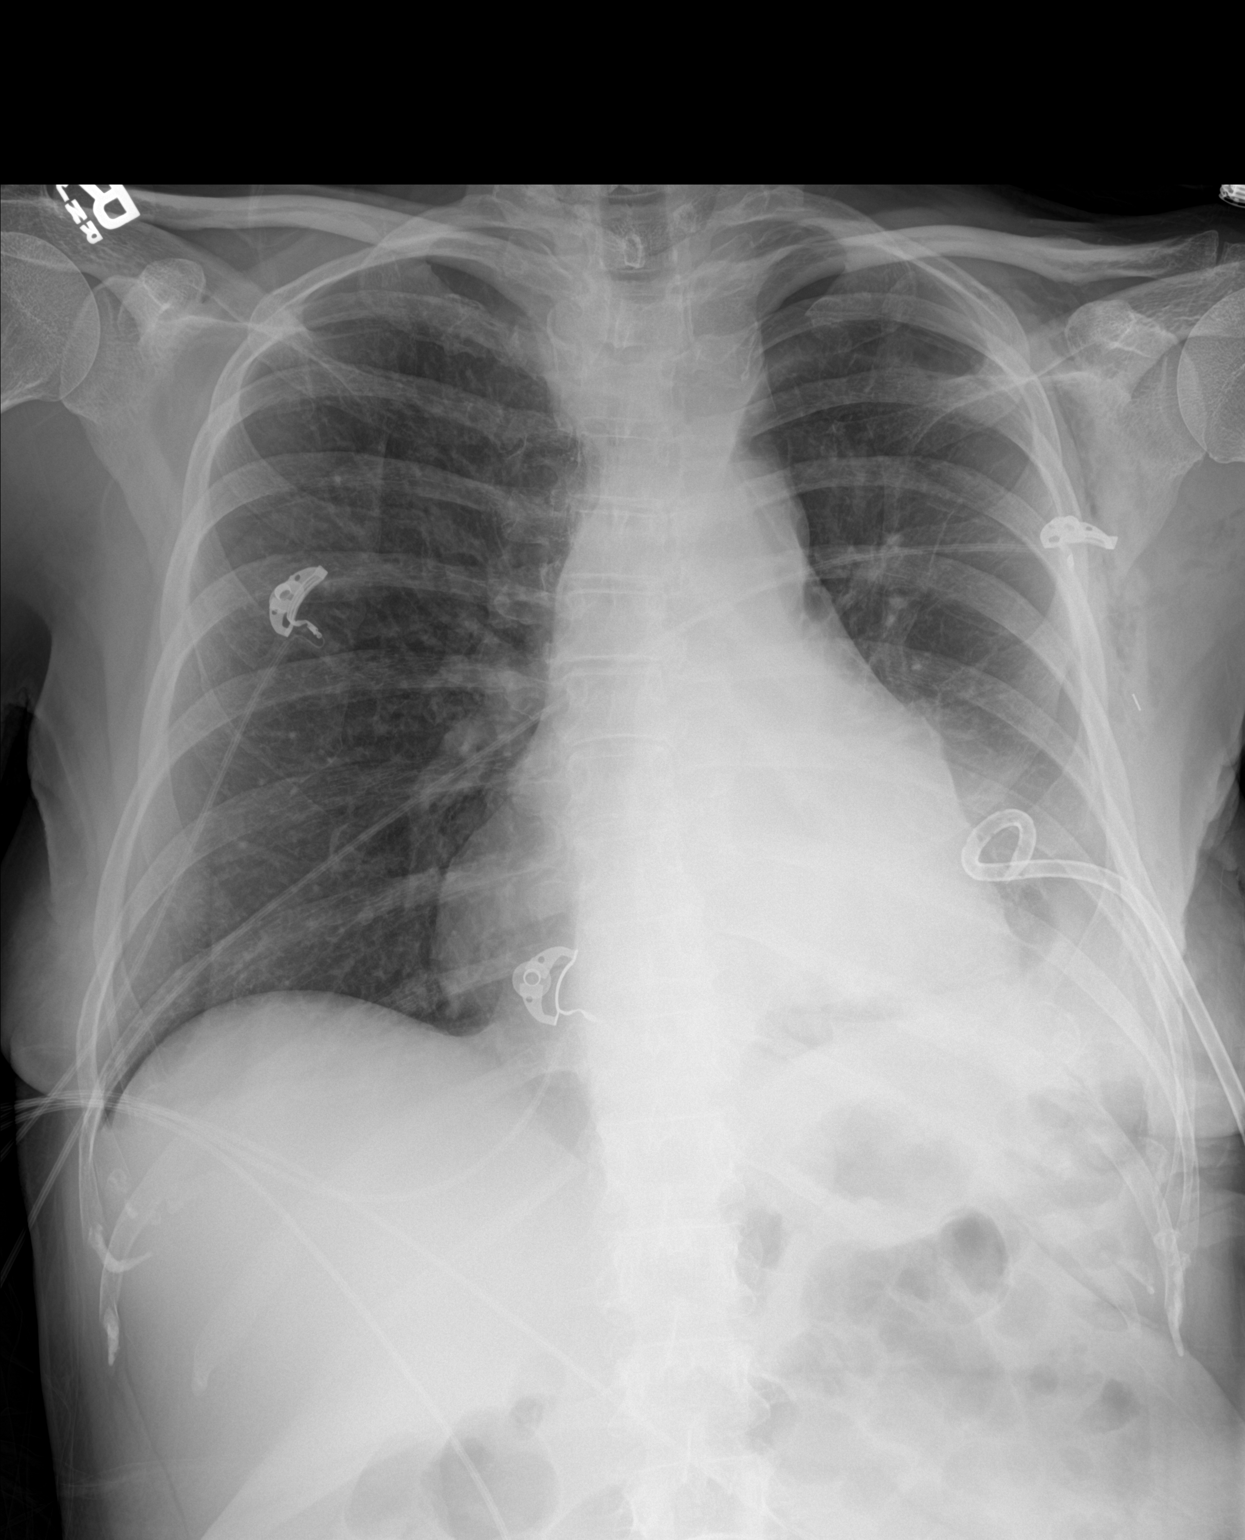

[1 of 1 positions shown; findings below may reference images not displayed]

FINDINGS: Trachea is midline. Heart size stable. Persistent left lower lobe
collapse/consolidation. Small to moderate left hydropneumothorax
with a small bore drain in place, as before. Associated subcutaneous
emphysema in the left axillary region. Right lung is clear.
IMPRESSION: 1. Small to moderate left hydropneumothorax with drain in place,
unchanged.
2. Persistent left lower lobe collapse/consolidation, possibly
representing pneumonia.

## 2021-02-01 MED ORDER — LEVETIRACETAM 500 MG PO TABS
500.0000 mg | ORAL_TABLET | Freq: Two times a day (BID) | ORAL | Status: DC
  Administered 2021-02-01 – 2021-02-04 (×6): 500 mg via ORAL
  Filled 2021-02-01 (×7): qty 1

## 2021-02-01 NOTE — Progress Notes (Signed)
Tami Stone is out of the ICU.  She is now up on 4 E.  She still has a chest tube in.  Hopefully this will be able to come out.  I will have to speak with Radiation Oncology today about the brain mets.  She is on Decadron and Keppra.  There has been no neurological issues.  She does state a little bit of dizziness.  She is eating.  There is no problems with dysphagia.  She not have any issues with aspiration.  She is not on any oxygen.  Her breathing seems relatively comfortable.  She is now on the [place] and Tafinlar.  Her labs show white cell count 7.3.  Hemoglobin 10.5.  Platelet count 381,000.  Her BUN is 24 creatinine 0.55.  She has had no fever.  There has been no bleeding.  She has had no nausea or vomiting.  She does have a little bit of diarrhea now.  She was having some constipation before.  Her vital signs are temperature of 97.8.  Pulse 87.  Blood pressure 104/67.  Oxygen saturations 98%.  Her head neck exam is unremarkable.  There is no oral thrush.  Her lungs sound pretty clear bilaterally.  She has good air movement bilaterally.  Cardiac exam regular rate and rhythm.  Abdomen is soft.  Bowel sounds are present.  There is no guarding or rebound tenderness.  There is no abdominal mass.  There is no fluid wave.  There is no palpable liver or spleen tip.  Extremity shows no clubbing, cyanosis or edema.  She has good range of motion of her joints.  Neurological exam shows no focal deficits.  Tami Stone is a very nice 70 year old white female with metastatic non-small cell lung cancer.  She does have a BRAF mutation.  She is on the [place] and [place] now.  Hopefully we can keep her on this.  She has the brain mets.  They are small.  I think there would be very receptive for radiosurgery.  I will have to speak with radiation oncology about this.  I think the hospitalization will be dictated by her chest tube.  I am not sure when this is going to come out.  I do appreciate all  the great care that she is getting from everybody up on 4E.  I do appreciate all their hard work.  Lattie Haw, MD  Acts 4:12

## 2021-02-01 NOTE — Progress Notes (Signed)
[facility]  PROGRESS NOTE  Ingri Diemer OEU:[id number] DOB: 11/05/1950 DOA: 01/28/2021 PCP: Gaynelle Cage, MD   Brief HPI:   70 year old female with history of metastatic adenocarcinoma of the lung, invasive ductal carcinoma of the breast, malignant pleural effusion, pericardial effusion presented with weakness.  Also passed out with tremors at home.  Chest x-ray showed large hydropneumothorax.  PCCM was consulted and chest tube was placed.  CT head showed ill-defined hypodensity in the frontal region.  MRI brain showed small multiple metastasis with mild surrounding edema.    Subjective   Denies dyspnea, chest pain. No more seizures in the hospital.   Assessment/Plan:    Syncope/?  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 twice daily; obtained EEG-mild diffuse encephalopathy, nonspecific etiology.  No seizures or epileptiform discharges seen. -Also started on Decadron 8 mg p.o. twice daily, discussed with Dr. Marin Olp -Dr. Marin Olp  will discuss with radiation oncology for radiosurgery for brain mets  Malignant pleural effusion -Chest x-ray showed left-sided hydropneumothorax -Chest tube placed per PCCM -Dr. Silas Flood recommends continuing chest tube till discharge. -As per PCCM, recent admission 8/8-01/2011 for dyspnea and cough, noted to have large left pleural effusion with midline shift to the right, s/p 2.4 L thoracentesis by IR, cytology consistent with malignant cells-metastatic adenocarcinoma.  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   History of pericardial effusion -Seen on echo on 01/18/2021 -Asymptomatic -Continue to monitor  Hypothyroidism -Continue NP thyroid  Diabetes mellitus type 2 -CBG has  been well controlled; hemoglobin A1c 6.2 -Continue metformin -Patient wants to continue metformin in the hospital   Scheduled medications:    Chlorhexidine Gluconate Cloth  6 each Topical Daily   dabrafenib mesylate  100 mg Oral BID   dexamethasone  8 mg Oral Q12H   dronabinol  5 mg Oral BID AC   feeding supplement  237 mL Oral TID BM   letrozole  2.5 mg Oral Daily   metFORMIN  750 mg Oral BID WC   senna  1 tablet Oral Daily   sodium chloride flush  10 mL Intracatheter Q8H   sodium chloride flush  3 mL Intravenous Q12H   thyroid  60 mg Oral Daily   trametinib dimethyl sulfoxide  2 mg Oral Daily         Data Reviewed:   CBG:  Recent Labs  Lab 01/31/21 1119 01/31/21 1604 01/31/21 2141 02/01/21 0547 02/01/21 0749  GLUCAP 90 155* 120* 141* 102*    SpO2: 98 %    Vitals:   01/31/21 0518 01/31/21 1251 01/31/21 2143 02/01/21 0500  BP: 129/87 110/73 103/65 104/67  Pulse: 82 97 90 87  Resp: 20 20 20 20   Temp: 97.9 F (36.6 C) 97.8 F (36.6 C) 97.7 F (36.5 C) 97.8 F (36.6 C)  TempSrc:  Oral Oral Oral  SpO2: 100% 90% 99% 98%  Weight: 53.7 kg   53.3 kg  Height:         Intake/Output Summary (Last 24 hours) at 02/01/2021 0907 Last data filed at 02/01/2021 0600 Gross per 24 hour  Intake 333 ml  Output 90 ml  Net 243 ml    08/20 1901 - 08/22 0700 In: 1029.3 [P.O.:540; I.V.:169.3] Out: 290   Filed Weights   01/28/21 1228 01/31/21  0518 02/01/21 0500  Weight: 60 kg 53.7 kg 53.3 kg    CBC:  Recent Labs  Lab 01/28/21 1301 01/29/21 0737 01/30/21 0210 02/01/21 0437  WBC 8.6 6.1 5.5 7.3  HGB 10.4* 10.1* 11.2* 10.5*  HCT 32.6* 31.9* 35.3* 34.0*  PLT 329 305 323 381  MCV 86.9 86.2 86.3 88.5  MCH 27.7 27.3 27.4 27.3  MCHC 31.9 31.7 31.7 30.9  RDW 15.7* 15.5 14.9 15.2  LYMPHSABS 0.7  --   --   --   MONOABS 0.3  --   --   --   EOSABS 0.0  --   --   --   BASOSABS 0.0  --   --   --     Complete metabolic panel:  Recent Labs  Lab 01/28/21 1301  01/29/21 0737 01/30/21 0210 01/30/21 1158 02/01/21 0437  NA 133* 132* 134*  --  138  K 3.8 3.7 4.2  --  4.5  CL 100 100 101  --  104  CO2 26 26 24   --  26  GLUCOSE 143* 108* 128*  --  129*  BUN 22 14 10   --  24*  CREATININE 0.64 0.50 0.45  --  0.55  CALCIUM 8.0* 7.8* 8.3*  --  8.5*  AST 18 15  --   --   --   ALT 9 10  --   --   --   ALKPHOS 79 72  --   --   --   BILITOT 0.5 0.2*  --   --   --   ALBUMIN 2.5* 2.3*  --   --   --   HGBA1C  --   --   --  6.2*  --     No results for input(s): LIPASE, AMYLASE in the last 168 hours.  Recent Labs  Lab 01/28/21 1301  SARSCOV2NAA NEGATIVE    ------------------------------------------------------------------------------------------------------------------ No results for input(s): CHOL, HDL, LDLCALC, TRIG, CHOLHDL, LDLDIRECT in the last 72 hours.  Lab Results  Component Value Date   HGBA1C 6.2 (H) 01/30/2021   ------------------------------------------------------------------------------------------------------------------ No results for input(s): TSH, T4TOTAL, T3FREE, THYROIDAB in the last 72 hours.  Invalid input(s): FREET3 ------------------------------------------------------------------------------------------------------------------ No results for input(s): VITAMINB12, FOLATE, FERRITIN, TIBC, IRON, RETICCTPCT in the last 72 hours.  Coagulation profile No results for input(s): INR, PROTIME in the last 168 hours. No results for input(s): DDIMER in the last 72 hours.  Cardiac Enzymes No results for input(s): CKTOTAL, CKMB, CKMBINDEX, TROPONINI in the last 168 hours.  ------------------------------------------------------------------------------------------------------------------    Component Value Date/Time   BNP 16.6 01/18/2021 1400     Antibiotics: Anti-infectives (From admission, onward)    None        Radiology Reports  DG CHEST PORT 1 VIEW  Result Date: 01/31/2021 CLINICAL DATA:  Lt sided chest tube  EXAM: PORTABLE CHEST - 1 VIEW COMPARISON:  the previous day's study FINDINGS: Left pigtail chest tube stable. Persistent left apical pneumothorax, lung apex projecting inferior to the posterior aspect left third rib as before. Consolidation/atelectasis in the left lower lung. Heart size and mediastinal contours are within normal limits. Possible layering left pleural effusion.  Right lung clear. Visualized bones unremarkable. IMPRESSION: 1. Stable left chest tube with persistent apical pneumothorax. 2. Persistent left lower lung consolidation/atelectasis with possible effusion. Electronically Signed   By: Lucrezia Europe M.D.   On: 01/31/2021 12:12      DVT prophylaxis: SCDs  Code Status: Full code  Family Communication: No family at bedside  Consultants: PCCM Oncology  Procedures:     Objective    Physical Examination:  General-appears in no acute distress Heart-S1-S2, regular, no murmur auscultated Lungs-clear to auscultation bilaterally, no wheezing or crackles auscultated Abdomen-soft, nontender, no organomegaly Extremities-no edema in the lower extremities Neuro-alert, oriented x3, no focal deficit noted  Status is: Inpatient  Dispo: The patient is from: Home              Anticipated d/c is to: Home              Anticipated d/c date is: 02/03/2021              Patient currently not stable for discharge  Barrier to discharge-ongoing evaluation for seizure, brain metastasis  COVID-19 Labs  No results for input(s): DDIMER, FERRITIN, LDH, CRP in the last 72 hours.  Lab Results  Component Value Date   [place] NEGATIVE 01/28/2021   [place] NEGATIVE 01/18/2021   [place] NEGATIVE 04/29/2020    Microbiology  Recent Results (from the past 240 hour(s))  Resp Panel by RT-PCR (Flu A&B, Covid) Nasopharyngeal Swab     Status: None   Collection Time: 01/28/21  1:01 PM   Specimen: Nasopharyngeal Swab; Nasopharyngeal(NP) swabs in vial transport medium  Result Value  Ref Range Status   SARS Coronavirus 2 by RT PCR NEGATIVE NEGATIVE Final    Comment: (NOTE) SARS-CoV-2 target nucleic acids are NOT DETECTED.  The SARS-CoV-2 RNA is generally detectable in upper respiratory specimens during the acute phase of infection. The lowest concentration of SARS-CoV-2 viral copies this assay can detect is 138 copies/mL. A negative result does not preclude SARS-Cov-2 infection and should not be used as the sole basis for treatment or other patient management decisions. A negative result may occur with  improper specimen collection/handling, submission of specimen other than nasopharyngeal swab, presence of viral mutation(s) within the areas targeted by this assay, and inadequate number of viral copies(<138 copies/mL). A negative result must be combined with clinical observations, patient history, and epidemiological information. The expected result is Negative.  Fact Sheet for Patients:  EntrepreneurPulse.com.au  Fact Sheet for Healthcare Providers:  IncredibleEmployment.be  This test is no t yet approved or cleared by the [place] FDA and  has been authorized for detection and/or diagnosis of SARS-CoV-2 by FDA under an Emergency Use Authorization (EUA). This EUA will remain  in effect (meaning this test can be used) for the duration of the COVID-19 declaration under Section 564(b)(1) of the Act, 21 U.S.C.section 360bbb-3(b)(1), unless the authorization is terminated  or revoked sooner.       Influenza A by PCR NEGATIVE NEGATIVE Final   Influenza B by PCR NEGATIVE NEGATIVE Final    Comment: (NOTE) The Xpert Xpress SARS-CoV-2/FLU/RSV plus assay is intended as an aid in the diagnosis of influenza from Nasopharyngeal swab specimens and should not be used as a sole basis for treatment. Nasal washings and aspirates are unacceptable for Xpert Xpress SARS-CoV-2/FLU/RSV testing.  Fact Sheet for  Patients: EntrepreneurPulse.com.au  Fact Sheet for Healthcare Providers: IncredibleEmployment.be  This test is not yet approved or cleared by the [place] FDA and has been authorized for detection and/or diagnosis of SARS-CoV-2 by FDA under an Emergency Use Authorization (EUA). This EUA will remain in effect (meaning this test can be used) for the duration of the COVID-19 declaration under Section 564(b)(1) of the Act, 21 U.S.C. section 360bbb-3(b)(1), unless the authorization is terminated or revoked.  Performed at [facility], [address]  [address] [place], [place] [address]   MRSA Next Gen by PCR, Nasal     Status: None   Collection Time: 01/29/21  6:53 PM   Specimen: Nasal Mucosa; Nasal Swab  Result Value Ref Range Status   MRSA by PCR Next Gen NOT DETECTED NOT DETECTED Final    Comment: (NOTE) The GeneXpert MRSA Assay (FDA approved for NASAL specimens only), is one component of a comprehensive MRSA colonization surveillance program. It is not intended to diagnose MRSA infection nor to guide or monitor treatment for MRSA infections. Test performance is not FDA approved in patients less than 60 years old. Performed at [facility], [place] [address] [place], [place] [address]          Oswald Hillock   [facility] If 7PM-7AM, please contact night-coverage at [email or web address], Office  [phone]   02/01/2021, 9:07 AM  LOS: 3 days

## 2021-02-01 NOTE — Consult Note (Addendum)
Radiation Oncology         [phone] ________________________________  Name: Tami Stone        MRN: [id number]  Date of Service: 02/01/21 DOB: [birth year]  CC:Gaynelle Cage, MD     REFERRING PHYSICIAN: Dr. Marin Olp   DIAGNOSIS: The primary encounter diagnosis was Metastatic malignant neoplasm, unspecified site [facility]). Diagnoses of Hydropneumothorax, Syncope, unspecified syncope type, Encounter for chest tube placement, Adenocarcinoma of left lung, stage 4 ([place]), and Chest tube in place were also pertinent to this visit.   HISTORY OF PRESENT ILLNESS: Tami Stone is a 70 y.o. female seen at the request of Dr. Marin Olp. We had last seen her in lung cancer [facility] on 05/14/2020 when she had been found to have a mass in the left lower lobe.  The mass was 6 cm in greatest dimension with adjacent collapsed lung tissue as well as left axillary and anterior mediastinal adenopathy bronchoscopy on 04/30/2020 showed an adenocarcinoma within the left lower lobe endobronchial and transbronchial biopsies. She had been recommended to proceed with completion of her staging work-up. An MRI of the brain on 05/19/2020 showed a 2 mm area of enhancement in the right medial occipital lobe possibly metastatic disease or tortuous vessel and pet imaging showed hypermetabolism within the large left lower lobe mass ipsilateral and contralateral nodal metastases as well as a right supraclavicular nodal metastasis and hypermetabolic left breast mass and left axillary adenopathy a skeletal metastasis in the L1 vertebral body was also seen.  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  She elected to relocate her medical care to [facility].  Her lung tumor  interestingly was BRAF mutated so she began targeted therapy for this as well as Femara.  She has not received any targeted HER2 therapy and relocated her care to Dr. Marin Olp in July of this year.  She presented however to the emergency department on 01/28/2021 with seizure.  Work-up included an MRI of the brain which shows multiple intracranial lesions the largest of which was 6 mm in the anterior left frontal lobe the lesions had mild surrounding vasogenic edema there is also a punctate area of enhancement in the right cerebellum.  She is contacted to discuss options of stereotactic radiosurgery.    PREVIOUS RADIATION THERAPY: No   PAST MEDICAL HISTORY:  Past Medical History:  Diagnosis Date   Diabetes ([place])    High cholesterol    Lung cancer ([place])    04/2020       PAST SURGICAL HISTORY: Past Surgical History:  Procedure Laterality Date   BREAST BIOPSY Right    BRONCHIAL BIOPSY  04/30/2020   Procedure: BRONCHIAL BIOPSIES;  Surgeon: Collene Gobble, MD;  Location: [place];  Service: Cardiopulmonary;;   BRONCHIAL BRUSHINGS  04/30/2020   Procedure: BRONCHIAL BRUSHINGS;  Surgeon: Collene Gobble, MD;  Location: [place];  Service: Cardiopulmonary;;   BRONCHIAL BRUSHINGS  01/20/2021   Procedure: BRONCHIAL BRUSHINGS;  Surgeon: Laurin Coder, MD;  Location: [facility];  Service: Endoscopy;;   BRONCHIAL WASHINGS  01/20/2021   Procedure: BRONCHIAL WASHINGS;  Surgeon: Laurin Coder, MD;  Location: [facility];  Service: Endoscopy;;   HEMOSTASIS CONTROL  04/30/2020   Procedure: HEMOSTASIS CONTROL;  Surgeon: Collene Gobble, MD;  Location: [place] [facility];  Service: Cardiopulmonary;;   VIDEO BRONCHOSCOPY N/A 04/30/2020   Procedure: VIDEO BRONCHOSCOPY WITH FLUORO;  Surgeon: Collene Gobble, MD;  Location: [place];  Service: Cardiopulmonary;  Laterality: N/A;   VIDEO BRONCHOSCOPY N/A 01/20/2021   Procedure: VIDEO BRONCHOSCOPY WITHOUT FLUORO;  Surgeon: Laurin Coder, MD;   Location: [facility];  Service: Endoscopy;  Laterality: N/A;     FAMILY HISTORY:  Family History  Problem Relation Age of Onset   Breast cancer Sister        half sister on maternal side     SOCIAL HISTORY:  reports that she has never smoked. She has never used smokeless tobacco. She reports that she does not currently use alcohol. She reports that she does not use drugs.   ALLERGIES: Patient has no known allergies.   MEDICATIONS:  Current Facility-Administered Medications  Medication Dose Route Frequency Provider Last Rate Last Admin   0.9 %  sodium chloride infusion   Intravenous Continuous Oswald Hillock, MD 10 mL/hr at 01/29/21 0905 Rate Change at 01/29/21 0905   acetaminophen (TYLENOL) tablet 650 mg  650 mg Oral Q6H PRN Hongalgi, Lenis Dickinson, MD       Chlorhexidine Gluconate Cloth 2 % PADS 6 each  6 each Topical Daily Oswald Hillock, MD   6 each at 02/01/21 1610   dabrafenib mesylate (TAFLINAR) capsule 100 mg  100 mg Oral BID Hunsucker, Bonna Gains, MD   100 mg at 02/01/21 9604   dexamethasone (DECADRON) tablet 8 mg  8 mg Oral Q12H Oswald Hillock, MD   8 mg at 02/01/21 1001   dronabinol (MARINOL) capsule 5 mg  5 mg Oral BID AC Volanda Napoleon, MD   5 mg at 02/01/21 1424   feeding supplement (ENSURE ENLIVE / ENSURE PLUS) liquid 237 mL  237 mL Oral TID BM Hongalgi, Anand D, MD   237 mL at 01/31/21 2128   letrozole [facility]) tablet 2.5 mg  2.5 mg Oral Daily Hunsucker, Bonna Gains, MD   2.5 mg at 02/01/21 1001   levETIRAcetam (KEPPRA) tablet 500 mg  500 mg Oral BID Oswald Hillock, MD       metFORMIN (GLUCOPHAGE-XR) 24 hr tablet 750 mg  750 mg Oral BID WC Volanda Napoleon, MD   750 mg at 02/01/21 0843   methocarbamol (ROBAXIN) tablet 500 mg  500 mg Oral Q6H PRN Oswald Hillock, MD   500 mg at 01/29/21 2140   morphine 2 MG/ML injection 2 mg  2 mg Intravenous Q4H PRN Marylyn Ishihara, Tyrone A, DO   2 mg at 01/29/21 0751   ondansetron (ZOFRAN) injection 4 mg  4 mg Intravenous Q6H PRN Marylyn Ishihara, Tyrone A, DO   4  mg at 01/29/21 1626   senna (SENOKOT) tablet 8.6 mg  1 tablet Oral Daily Vernell Leep D, MD   8.6 mg at 01/31/21 0932   sodium chloride flush (NS) 0.9 % injection 10 mL  10 mL Intracatheter Q8H Ollis, Brandi L, NP   10 mL at 02/01/21 0258   sodium chloride flush (NS) 0.9 % injection 3 mL  3 mL Intravenous Q12H Kyle, Tyrone A, DO   3 mL at 01/31/21 2158   thyroid (ARMOUR) tablet 60 mg  60 mg Oral Daily Oswald Hillock, MD   60 mg at 02/01/21 5409   trametinib dimethyl sulfoxide (MEKINIST) tablet 2 mg  2 mg Oral Daily Hunsucker, Bonna Gains, MD   2 mg at 02/01/21 [phone]  REVIEW OF SYSTEMS: On review of systems, the patient reports that she is doing okay.  She had to end her conversation before complete review of systems could be performed however she states that this was her first seizure ever.  She has been very sleepy and in her words woozy since taking Keppra.  She denies any visual changes.  No other specific complaints voiced.    PHYSICAL EXAM:  Wt Readings from Last 3 Encounters:  02/01/21 117 lb 8 oz (53.3 kg)  01/18/21 132 lb 15 oz (60.3 kg)  12/17/20 133 lb (60.3 kg)   Temp Readings from Last 3 Encounters:  02/01/21 98 F (36.7 C) (Oral)  01/22/21 98.3 F (36.8 C) (Oral)  12/17/20 98.2 F (36.8 C) (Oral)   BP Readings from Last 3 Encounters:  02/01/21 124/79  01/22/21 123/74  01/18/21 (!) (P) 158/107   Pulse Readings from Last 3 Encounters:  02/01/21 89  01/22/21 94  12/17/20 88   Pain Assessment Pain Score: 0-No pain/10  Unable to assess given encounter type.  ECOG = 1  0 - Asymptomatic (Fully active, able to carry on all predisease activities without restriction)  1 - Symptomatic but completely ambulatory (Restricted in physically strenuous activity but ambulatory and able to carry out work of a light or sedentary nature. For example, light housework, office work)  2 - Symptomatic, <50% in bed during the day (Ambulatory and capable of all self care but unable  to carry out any work activities. Up and about more than 50% of waking hours)  3 - Symptomatic, >50% in bed, but not bedbound (Capable of only limited self-care, confined to bed or chair 50% or more of waking hours)  4 - Bedbound (Completely disabled. Cannot carry on any self-care. Totally confined to bed or chair)  5 - Death   Eustace Pen MM, Creech RH, Tormey DC, et al. [phone]). "Toxicity and response criteria of the [facility]". [place]. 5 (6): 649-55    LABORATORY DATA:  Lab Results  Component Value Date   WBC 7.3 02/01/2021   HGB 10.5 (L) 02/01/2021   HCT 34.0 (L) 02/01/2021   MCV 88.5 02/01/2021   PLT 381 02/01/2021   Lab Results  Component Value Date   NA 138 02/01/2021   K 4.5 02/01/2021   CL 104 02/01/2021   CO2 26 02/01/2021   Lab Results  Component Value Date   ALT 10 01/29/2021   AST 15 01/29/2021   ALKPHOS 72 01/29/2021   BILITOT 0.2 (L) 01/29/2021      RADIOGRAPHY: DG Chest 1 View  Result Date: 02/01/2021 CLINICAL DATA:  Left chest tube. EXAM: CHEST  1 VIEW COMPARISON:  01/31/2021 and CT chest 01/19/2021. FINDINGS: Trachea is midline. Heart size stable. Persistent left lower lobe collapse/consolidation. Small to moderate left hydropneumothorax with a small bore drain in place, as before. Associated subcutaneous emphysema in the left axillary region. Right lung is clear. IMPRESSION: 1. Small to moderate left hydropneumothorax with drain in place, unchanged. 2. Persistent left lower lobe collapse/consolidation, possibly representing pneumonia. Electronically Signed   By: Lorin Picket M.D.   On: 02/01/2021 11:23   DG Chest 1 View  Result Date: 01/21/2021 CLINICAL DATA:  Post thoracentesis EXAM: CHEST  1 VIEW COMPARISON:  Chest radiograph 1 day prior FINDINGS: The cardiomediastinal silhouette is within normal limits. The left pleural effusion has decreased in size following thoracentesis with improved aeration of the upper lung. No  pneumothorax is seen; a  vertically line projecting over the mid left hemithorax likely reflects skinfold artifact. The right lung is clear. There is no right pleural effusion or pneumothorax The bones are stable. IMPRESSION: Decreased left pleural effusion with improved aeration of the left lung following thoracentesis. No pneumothorax identified. Electronically Signed   By: Valetta Mole MD   On: 01/21/2021 13:15   DG Chest 1 View  Result Date: 01/20/2021 CLINICAL DATA:  History of metastatic adenocarcinoma and breast cancer with shortness of breath following echocardiogram EXAM: CHEST  1 VIEW COMPARISON:  Radiograph and CT 01/19/2021 FINDINGS: Subtotal opacification of the left hemithorax with some minimally residually aerated lung in the left lung apex within which are areas of more patchy consolidative opacity quite similar appearance to comparison CT. Right lung is relatively clear. Cardiomediastinal contours are partly obscured by overlying opacity though appear somewhat more prominent than on comparison portable radiography possibly attributable to a pericardial effusion seen on CT imaging as well. Telemetry leads overlie the chest. Degenerative changes are present in the imaged spine and shoulders. IMPRESSION: Large left effusion passive atelectasis opacified much of the left hemithorax. More patchy airspace disease is seen in the residually aerated portion of the left upper lobe. Possible re-expansion edema or pneumonia. Right lung is relatively clear. Prominent cardiac silhouette partly attributable to a pericardial effusion seen on CT. Electronically Signed   By: Lovena Le M.D.   On: 01/20/2021 19:18   DG Chest 1 View  Result Date: 01/19/2021 CLINICAL DATA:  Status post thoracentesis.  2.4 L removed. EXAM: CHEST  1 VIEW COMPARISON:  Chest radiograph 01/18/2021 FINDINGS: Post LEFT thoracentesis with return of the mediastinal structures to midline following fluid removal. A small amount of aerated  lung in the LEFT upper lobe is not demonstrated although there is continued near complete opacification of the LEFT hemithorax. No pneumothorax evident. RIGHT lung is clear without edema or pneumothorax. IMPRESSION: 1. Return of mediastinal structures to midline following thoracentesis. 2. A small amount of aerated lung is now evident in LEFT upper lobe following thoracentesis. LEFT hemithorax remains near completely opacified. 3. No pneumothorax. Electronically Signed   By: Suzy Bouchard M.D.   On: 01/19/2021 10:38   CT HEAD WO CONTRAST (5MM)  Result Date: 01/28/2021 CLINICAL DATA:  Seizure EXAM: CT HEAD WITHOUT CONTRAST TECHNIQUE: Contiguous axial images were obtained from the base of the skull through the vertex without intravenous contrast. COMPARISON:  Brain MRI 05/19/2020 FINDINGS: Brain: There is ill-defined hypodensity in the left frontal lobe subcortical white matter along the orbital gyrus (2-10, 4-15, 5-36). There is no evidence of acute intracranial hemorrhage or territorial infarct. The ventricles are not enlarged. There is no midline shift. Vascular: No hyperdense vessel or unexpected calcification. Skull: Normal. Negative for fracture or focal lesion. Sinuses/Orbits: The imaged paranasal sinuses are clear. The imaged globes and orbits are unremarkable. Other: The mastoid air cells are clear. IMPRESSION: Ill-defined enhancement in the left frontal lobe as above. 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 Electronically Signed   By: Valetta Mole M.D.   On: 01/28/2021 14:26   CT CHEST W CONTRAST  Result Date: 01/19/2021 CLINICAL DATA:  Abnormal chest x-ray. EXAM: CT CHEST WITH CONTRAST TECHNIQUE: Multidetector CT imaging of the chest was performed during intravenous contrast administration. CONTRAST:  3m OMNIPAQUE IOHEXOL 350 MG/ML SOLN COMPARISON:  Chest CT 04/16/2020.  Chest x-ray today.  FINDINGS: Cardiovascular: Heart is normal size.  Small pericardial effusion. Aorta is normal caliber. Mediastinum/Nodes: No mediastinal, hilar, or axillary adenopathy. Trachea and esophagus are unremarkable. Thyroid unremarkable. Lungs/Pleura: Large left pleural effusion. Only the left upper lobe is aerated with extensive airspace disease throughout the left upper lobe. Left lower lobe is collapsed which could be related to compressive atelectasis. No confluent opacity or effusion on the right. Linear atelectasis or scarring in the right middle lobe. Upper Abdomen: Imaging into the upper abdomen demonstrates no acute findings. Musculoskeletal: Chest wall soft tissues are unremarkable. There is a new sclerotic lesion within the anterior L1 vertebral body. No additional focal bone lesion or acute bony abnormality. IMPRESSION: Large left pleural effusion. Complete opacification of the left lower lobe with some aeration of the left upper lobe. Extensive airspace disease throughout the left upper lobe. This could reflect pneumonia or re-expansion edema following prior thoracentesis. No pneumothorax. Small pericardial effusion. New sclerotic lesion anteriorly within the L1 vertebral body. Cannot exclude sclerotic metastasis. Consider further evaluation with nuclear medicine bone scan or MRI. Electronically Signed   By: Rolm Baptise M.D.   On: 01/19/2021 18:38   MR Brain W and Wo Contrast  Result Date: 01/28/2021 CLINICAL DATA:  Seizure, nontraumatic (Age >= 26y). EXAM: MRI HEAD WITHOUT AND WITH CONTRAST TECHNIQUE: Multiplanar, multiecho pulse sequences of the brain and surrounding structures were obtained without and with intravenous contrast. CONTRAST:  53m GADAVIST GADOBUTROL 1 MMOL/ML IV SOLN COMPARISON:  Head CT 01/28/2021. FINDINGS: Brain: Cerebral volume is normal. There are multiple enhancing intracranial lesions compatible with intracranial metastatic disease, as follows. 4 mm enhancing lesion within the right  frontal lobe with mild surrounding edema (series 16, image 96). 3 mm enhancing lesion within the posterior left frontal lobe with mild surrounding edema (series 16, image 98). 6 mm enhancing lesion within the anterior left frontal lobe with mild surrounding edema (series 16, image 67). 2 mm enhancing lesion within the left parietal lobe with mild surrounding edema (series 16, image 91). 2 mm enhancing lesion within the right cerebellar hemisphere (series 16, image 29). 2 mm enhancing lesion within the left cerebellar hemisphere (series 16, image 18). Mild multifocal T2/FLAIR hyperintensity elsewhere within the cerebral white matter and pons is nonspecific, but compatible chronic small vessel ischemic disease. There is a punctate focus of apparent diffusion weighted hyperintensity within the right cerebellum appreciated on the axial diffusion-weighted imaging only (series 5, image 55). No chronic intracranial blood products. No extra-axial fluid collection. No midline shift. Vascular: Maintained flow voids within the proximal large arterial vessels. Skull and upper cervical spine: No focal suspicious marrow lesion. Sinuses/Orbits: Visualized orbits show no acute finding. Trace bilateral ethmoid sinus mucosal thickening. IMPRESSION: At least six enhancing metastatic lesions are identified within the supratentorial and infratentorial brain, measuring up to 4 mm, some with mild surrounding edema, as detailed. Punctate focus of diffusion weighted signal abnormality within the medial right cerebellum, appreciated on the axial diffusion-weighted sequence only. This may reflect an acute infarct, a poorly enhancing metastasis or artifact. Background mild chronic small vessel ischemic changes within the cerebral white matter and pons. Electronically Signed   By: KKellie SimmeringD.O.   On: 01/28/2021 18:03   DG CHEST PORT 1 VIEW  Result Date: 01/31/2021 CLINICAL DATA:  Lt sided chest tube EXAM: PORTABLE CHEST - 1 VIEW  COMPARISON:  the previous day's study FINDINGS: Left pigtail chest tube stable. Persistent left apical pneumothorax, lung apex projecting inferior to the posterior aspect left third rib as before. Consolidation/atelectasis in the left  lower lung. Heart size and mediastinal contours are within normal limits. Possible layering left pleural effusion.  Right lung clear. Visualized bones unremarkable. IMPRESSION: 1. Stable left chest tube with persistent apical pneumothorax. 2. Persistent left lower lung consolidation/atelectasis with possible effusion. Electronically Signed   By: Lucrezia Europe M.D.   On: 01/31/2021 12:12   DG CHEST PORT 1 VIEW  Result Date: 01/30/2021 CLINICAL DATA:  Diabetes, hydropneumothorax EXAM: PORTABLE CHEST 1 VIEW COMPARISON:  01/29/2021 FINDINGS: Left basilar pigtail chest tube is unchanged in position. Pulmonary insufflation is stable. Mild left-sided volume loss is unchanged. Opacification of the left lung base likely relates to residual pleural fluid, similar to that noted on prior examination. Small apical pneumothorax is unchanged. Subcutaneous gas is again seen within the left chest wall. The right lung is clear. No pneumothorax or pleural effusion on the right. Cardiac size is within normal limits. Retrocardiac opacity is unchanged. IMPRESSION: Stable small left hydropneumothorax. Left basilar chest tube is unchanged. Unchanged retrocardiac opacity corresponding to the known mass seen on recent CT examination. Stable pulmonary insufflation. Electronically Signed   By: Fidela Salisbury M.D.   On: 01/30/2021 00:27   DG CHEST PORT 1 VIEW  Result Date: 01/29/2021 CLINICAL DATA:  Chest tube with hydropneumothorax EXAM: PORTABLE CHEST 1 VIEW COMPARISON:  01/28/2021 FINDINGS: Left chest tube in place. Left apical pneumothorax reaching the fourth posterior rib. Soft tissue emphysema. Hazy density with probable fissural component at the left base is unchanged. Known left lower lobe mass. Clear  right lung and stable heart size. IMPRESSION: Unchanged left hydropneumothorax and lower lobe opacification. Electronically Signed   By: Monte Fantasia M.D.   On: 01/29/2021 05:45   DG CHEST PORT 1 VIEW  Result Date: 01/28/2021 CLINICAL DATA:  Status post chest tube placement EXAM: PORTABLE CHEST 1 VIEW COMPARISON:  Film from earlier in the same day. FINDINGS: Pigtail catheter is now seen on the left with significant reduction in pleural effusion. The lung is incompletely re-expanded with both an apical and inferolateral pneumothorax component. These changes are likely related to incomplete re-expansion of the lung as opposed to a true pneumothorax. This is related to the longstanding pleural effusion. Right lung is clear. Cardiac shadow is within normal limits. IMPRESSION: Status post chest tube placement with significant reduction in left-sided pleural effusion. There remains left lung consolidation and incomplete re-expansion as described related to the longstanding nature of the effusion as opposed to a true pneumothorax. Follow-up films are recommended. Electronically Signed   By: Inez Catalina M.D.   On: 01/28/2021 17:01   DG Chest Portable 1 View  Addendum Date: 01/28/2021   ADDENDUM REPORT: 01/28/2021 13:43 ADDENDUM: Critical Value/emergent results were called by telephone at the time of interpretation on 01/28/2021 at 1:36 pm to provider Davonna Belling , who verbally acknowledged these results. Electronically Signed   By: Marijo Conception M.D.   On: 01/28/2021 13:43   Result Date: 01/28/2021 CLINICAL DATA:  Syncope. EXAM: PORTABLE CHEST 1 VIEW COMPARISON:  January 21, 2021. FINDINGS: Stable cardiomediastinal silhouette. Right lung is clear. Left apical pneumothorax is noted with large pleural effusion. Bony thorax is unremarkable. IMPRESSION: Large left-sided hydropneumothorax is noted. Electronically Signed: By: Marijo Conception M.D. On: 01/28/2021 13:36   DG Chest Port 1 View  Result Date:  01/18/2021 CLINICAL DATA:  Shortness of breath.  Stage IV lung cancer EXAM: PORTABLE CHEST 1 VIEW COMPARISON:  04/30/2020 FINDINGS: Complete opacification of the left hemithorax with rightward displacement of the heart  and mediastinal structures. The visualized portions of the right lung are grossly clear. No appreciable right-sided pleural fluid collection. No pneumothorax. IMPRESSION: Complete opacification of the left hemithorax. Findings likely represent a combination of a large pleural effusion and atelectasis. Electronically Signed   By: Davina Poke D.O.   On: 01/18/2021 14:18   EEG adult  Result Date: 01/29/2021 Lora Havens, MD     01/29/2021 12:04 PM Patient Name: Tami Stone MRN: [id number] Epilepsy Attending: Lora Havens Referring Physician/Provider: Dr Cherylann Ratel Date: 01/29/2021 Duration: 25 mins Patient history: 70yo F who presented with syncope. 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 EEG to evaluate for seizure. Level of alertness: Awake, drowsy AEDs during EEG study: LEV Technical aspects: This EEG study was done with scalp electrodes positioned according to the 10-20 International system of electrode placement. Electrical activity was acquired at a sampling rate of 500Hz  and reviewed with a high frequency filter of 70Hz  and a low frequency filter of 1Hz . EEG data were recorded continuously and digitally stored. Description: The posterior dominant rhythm consists of 9 Hz activity of moderate voltage (25-35 uV) seen predominantly in posterior head regions, symmetric and reactive to eye opening and eye closing. Drowsiness was characterized by attenuation of the posterior background rhythm. EEG showed intermittent generalized 3 to 6 Hz theta-delta slowing. Hyperventilation and photic stimulation were not performed.   ABNORMALITY -  Intermittent slow, generalized IMPRESSION: This study is suggestive of mild diffuse encephalopathy, nonspecific etiology. No seizures or epileptiform discharges were seen throughout the recording. Lora Havens   ECHOCARDIOGRAM COMPLETE  Result Date: 01/18/2021    ECHOCARDIOGRAM REPORT   Patient Name:   Tami Stone Date of Exam: 01/18/2021 Medical Rec #:  [id number]          Height:       62.0 in Accession #:    [id number]         Weight:       133.0 lb Date of Birth:  1950-08-25          BSA:          1.607 m Patient Age:    1 years           BP:           165/110 mmHg Patient Gender: F                  HR:           101 bpm. Exam Location:  [place] Procedure: 2D Echo, Cardiac Doppler, Color Doppler and Strain Analysis                    STAT ECHO Reported to: Dr Stanford Breed on 01/18/2021 1:35:00 PM                   tamponade. Indications:    Pericardial effusion  History:        Patient has no prior history of Echocardiogram examinations.  Sonographer:    Luisa Hart RDCS Referring Phys: [place]  1. Left ventricular ejection fraction, by estimation, is 55 to 60%. The left ventricle has normal function. The left ventricle has no regional wall motion abnormalities. Left ventricular diastolic parameters are consistent with Grade I diastolic dysfunction (impaired relaxation).  2. Right ventricular systolic function is normal. The right ventricular size  is normal.  3. Grossly no evidence of tamponade. Moderate pericardial effusion. The pericardial effusion is circumferential. Large pleural effusion in the left lateral region.  4. The mitral valve is normal in structure. Mild mitral valve regurgitation. No evidence of mitral stenosis.  5. The aortic valve is normal in structure. Aortic valve regurgitation is not visualized. No aortic stenosis is present.  6. The inferior vena cava is normal in size with greater than 50% respiratory variability, suggesting right atrial pressure of 3  mmHg. FINDINGS  Left Ventricle: Left ventricular ejection fraction, by estimation, is 55 to 60%. The left ventricle has normal function. The left ventricle has no regional wall motion abnormalities. The left ventricular internal cavity size was normal in size. There is  no left ventricular hypertrophy. Left ventricular diastolic parameters are consistent with Grade I diastolic dysfunction (impaired relaxation). Right Ventricle: The right ventricular size is normal. No increase in right ventricular wall thickness. Right ventricular systolic function is normal. Left Atrium: Left atrial size was normal in size. Right Atrium: Right atrial size was normal in size. Pericardium: Grossly no evidence of tamponade. A moderately sized pericardial effusion is present. The pericardial effusion is circumferential. Mitral Valve: The mitral valve is normal in structure. Mild mitral valve regurgitation. No evidence of mitral valve stenosis. Tricuspid Valve: The tricuspid valve is normal in structure. Tricuspid valve regurgitation is not demonstrated. No evidence of tricuspid stenosis. Aortic Valve: The aortic valve is normal in structure. Aortic valve regurgitation is not visualized. No aortic stenosis is present. Aortic valve mean gradient measures 2.0 mmHg. Aortic valve peak gradient measures 6.2 mmHg. Pulmonic Valve: The pulmonic valve was normal in structure. Pulmonic valve regurgitation is not visualized. No evidence of pulmonic stenosis. Aorta: The aortic root is normal in size and structure. Venous: The inferior vena cava is normal in size with greater than 50% respiratory variability, suggesting right atrial pressure of 3 mmHg. IAS/Shunts: No atrial level shunt detected by color flow Doppler. Additional Comments: There is a large pleural effusion in the left lateral region.   LV Volumes (MOD) LV vol d, MOD A2C: 51.1 ml Diastology LV vol d, MOD A4C: 48.4 ml LV e' medial:    4.68 cm/s LV vol s, MOD A2C: 37.5 ml LV E/e' medial:   14.0 LV vol s, MOD A4C: 26.8 ml LV e' lateral:   5.33 cm/s LV SV MOD A2C:     13.6 ml LV E/e' lateral: 12.3 LV SV MOD A4C:     48.4 ml LV SV MOD BP:      18.8 ml LEFT ATRIUM             Index LA Vol (A2C):   33.3 ml 20.72 ml/m LA Vol (A4C):   28.1 ml 17.48 ml/m LA Biplane Vol: 31.5 ml 19.60 ml/m  AORTIC VALVE AV Vmax:           125.00 cm/s AV Vmean:          71.300 cm/s AV VTI:            0.176 m AV Peak Grad:      6.2 mmHg AV Mean Grad:      2.0 mmHg LVOT Vmax:         80.00 cm/s LVOT Vmean:        53.100 cm/s LVOT VTI:          0.116 m LVOT/AV VTI ratio: 0.66 MITRAL VALVE MV Area (PHT): 4.39 cm    SHUNTS MV Decel Time:  173 msec    Systemic VTI: 0.12 m MR Peak grad: 151.8 mmHg MR Vmax:      616.00 cm/s MV E velocity: 65.60 cm/s MV A velocity: 93.50 cm/s MV E/A ratio:  0.70 Jenne Campus MD Electronically signed by Jenne Campus MD Signature Date/Time: 01/18/2021/3:59:55 PM    Final    US THORACENTESIS ASP PLEURAL SPACE W/IMG GUIDE  Result Date: 01/21/2021 INDICATION: Patient with history of metastatic lung cancer, dyspnea, recurrent left pleural effusion. Request received for therapeutic left thoracentesis. EXAM: ULTRASOUND GUIDED THERAPEUTIC LEFT THORACENTESIS MEDICATIONS: 1% lidocaine to skin and subcutaneous tissue COMPLICATIONS: None immediate. PROCEDURE: An ultrasound guided thoracentesis was thoroughly discussed with the patient and questions answered. The benefits, risks, alternatives and complications were also discussed. The patient understands and wishes to proceed with the procedure. Written consent was obtained. Ultrasound was performed to localize and mark an adequate pocket of fluid in the left chest. The area was then prepped and draped in the normal sterile fashion. 1% Lidocaine was used for local anesthesia. Under ultrasound guidance a 6 Fr Safe-T-Centesis catheter was introduced. Thoracentesis was performed. The catheter was removed and a dressing applied. FINDINGS: A total of  approximately 1.8 liters of hazy,amber fluid was removed. IMPRESSION: Successful ultrasound guided therapeutic left thoracentesis yielding 1.8 liters of pleural fluid. Read by: Rowe Robert, PA-C Electronically Signed   By: Albin Felling MD   On: 01/21/2021 12:52   US THORACENTESIS ASP PLEURAL SPACE W/IMG GUIDE  Result Date: 01/19/2021 INDICATION: Patient with history of metastatic lung cancer, dyspnea, left pleural effusion. Request received for diagnostic and therapeutic left thoracentesis. EXAM: ULTRASOUND GUIDED DIAGNOSTIC AND THERAPEUTIC LEFT THORACENTESIS MEDICATIONS: 1% lidocaine to skin and subcutaneous tissue COMPLICATIONS: None immediate. PROCEDURE: An ultrasound guided thoracentesis was thoroughly discussed with the patient and questions answered. The benefits, risks, alternatives and complications were also discussed. The patient understands and wishes to proceed with the procedure. Written consent was obtained. Ultrasound was performed to localize and mark an adequate pocket of fluid in the left chest. The area was then prepped and draped in the normal sterile fashion. 1% Lidocaine was used for local anesthesia. Under ultrasound guidance a 6 Fr Safe-T-Centesis catheter was introduced. Thoracentesis was performed. The catheter was removed and a dressing applied. FINDINGS: A total of approximately 2.4 liters of hazy,amber fluid was removed. Samples were sent to the laboratory as requested by the clinical team. Due to pt coughing /chest discomfort only the above amount of fluid was removed today. IMPRESSION: Successful ultrasound guided diagnostic and therapeutic left thoracentesis yielding 2.4 liters of pleural fluid. Read by: Rowe Robert, PA-C Electronically Signed   By: Jerilynn Mages.  Shick M.D.   On: 01/19/2021 10:21       IMPRESSION/PLAN: 1. 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 I spent time reviewing her MRI results and the findings of 6  small lesions in the brain. We discussed the role of radiotherapy and the different styles of therapy. We would like to proceed with a 3T MRI for extent of disease and for treatment planning. She is interested in having this done while she is an inpt and I've ordered this for [facility]. She will need [facility] transportation. We will also introduce her to neurosurgery in the outpatient setting and I discussed their involvement in stereotactic radiosurgery [facility]) procedures though it does not include traditional surgical intervention. We discussed the risks, benefits, short, and long term effects of radiotherapy, as well as the  definitive intent, and the patient is interested in proceeding. We will start to coordinate and ask her to sign written consent at the time of simulation.  2. 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 The patient continues on Femara but is not currently on HER2 targeted therapy. She will discuss this further with Dr. Marin Olp as she proceeds with treatment for #1.   In a visit lasting 60 minutes, greater than 50% of the time was spent by phone and in floor time discussing the patient's condition, in preparation for the discussion, and coordinating the patient's care.     Carola Rhine, [facility] Advocate    **Disclaimer: This note was dictated with voice recognition software. Similar sounding words can inadvertently be transcribed and this note may contain transcription errors which may not have been corrected upon publication of note.**

## 2021-02-01 NOTE — Progress Notes (Signed)
NAMEKarrah Stone, MRN:  [id number], DOB:  07-08-1950, LOS: 3 ADMISSION DATE:  01/28/2021, CONSULTATION DATE:  8/18 REFERRING MD:  Dr. Alvino Chapel, CHIEF COMPLAINT:  SOB   History of Present Illness:  70 y/o F who presented to [facility] ER on 8/18 with reports of syncope.   The patient was recently admitted from 8/8-8/12 for at least a one month duration of shortness of breath with dry cough.  She was found to have a large left pleural effusion with mediastinal shift to the right.  She was admitted and underwent a thoracentesis completed by IR with 2.4L fluid removed.  Cytology consistent with malignant cells consistent with metastatic adenocarcinoma.   She is followed by Dr. Arnoldo Morale at [facility] from Oncology & Dr. Marin Olp as second opinion. She also follows with an integrative medicine specialist.  She has been using liquid Ivermectin for her cancer.    The patient reports she returned to the ER for what she describes as weakness & nausea.  She reports she has had intermittent fevers at home.  She notes weight loss of 164 lbs down to 119 since her diagnosis in November 2021.  Her daughter has been in visiting from [place] to help care for her and reported the patient became lightheaded and then was altered for approximately 45 seconds with tremors.  The patient has no memory of the events. EMS was activated.  On arrival she had soft normal blood pressures.  She was given IVF.  CT head was evaluated and showed a new ill defined enhancement in the left frontal lobe concerning for possible mass.  CXR showed a large left-sided hydropneumothorax.    PCCM consulted for evaluation of hydropneumothorax.   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+, prescribed Femara (does not regularly  take, uses liquid Ivermectin for therapy)  Significant Hospital Events: Including procedures, antibiotic start and stop dates in addition to other pertinent events   8/18 Admit post syncopal episode, hydropneumothorax s/p chest tube placement  8/19 CXR with residual hydropneumothorax, 2.4L drained from chest 8/20 CXR with stable PTX improved since admission 8/21 CXR with stable PTX, no air leak  Interim History / Subjective:  NAEON. 90cc chest tube output. CXR stable.  Objective   Blood pressure 124/79, pulse 89, temperature 98 F (36.7 C), temperature source Oral, resp. rate (!) 24, height 5' 2"  (1.575 m), weight 53.3 kg, SpO2 98 %.        Intake/Output Summary (Last 24 hours) at 02/01/2021 1644 Last data filed at 02/01/2021 1506 Gross per 24 hour  Intake 740 ml  Output 90 ml  Net 650 ml    Filed Weights   01/28/21 1228 01/31/21 0518 02/01/21 0500  Weight: 60 kg 53.7 kg 53.3 kg    Examination: General:  thin adult female resting in bed HEENT: MM pink/moist, no jvd, anicteric  Neuro: AAOx4, speech clear, MAE  CV: s1s2 RRR, no m/r/g PULM: non-labored at rest,  left chest tube with clear yellow drainage GI: soft, bsx4 active  Extremities: warm/dry, no edema  Skin: no rashes or lesions  Resolved Hospital Problem list     Assessment & Plan:   Left Hydropneumothorax: Presumed secondary spontaneous PTX due to cancer. May have been slow leak post thora 8/11, PTX ex vacuo. Largely  asymptomatic prior to presentation from a pulmonary standpoint.  Doubt syncopal episode related given no shift on CXR. Lung not re-expanded. Remains on suction. Lung appears trapped. Unlikely to expand. No air leak at time of evaluation. -continue chest tube to suction -plan is to remove chest tube prior to discharge, discussed role of heimlich valve, continuing at home and she does not want to do this after discussing risks and benefits -chest tube care per protocol  -Discussed role of Pleurx  catheter to managing malignant pleural effusion, she will contemplate and continue discussed with Dr. Marin Olp but is leaning towards not wanting to have a pleurx placed.  Once she is ready for discharge, we will discontinue chest tube.  Nothing further from our standpoint.  Will follow peripherally, please call if needed.   Montey Hora, [facility] For pager details, please see AMION or use Epic chat  After 1900, please call [facility] for cross coverage needs 02/01/2021, 4:48 PM

## 2021-02-02 ENCOUNTER — Ambulatory Visit (HOSPITAL_COMMUNITY)
Admit: 2021-02-02 | Discharge: 2021-02-02 | Disposition: A | Discharge status: Home / Self Care | Payer: Medicare Other | Attending: Pulmonary Disease | Admitting: Pulmonary Disease

## 2021-02-02 ENCOUNTER — Telehealth: Payer: Self-pay | Admitting: Radiation Therapy

## 2021-02-02 ENCOUNTER — Other Ambulatory Visit: Payer: Self-pay | Admitting: Radiation Therapy

## 2021-02-02 DIAGNOSIS — C3490 Malignant neoplasm of unspecified part of unspecified bronchus or lung: Secondary | ICD-10-CM | POA: Diagnosis not present

## 2021-02-02 DIAGNOSIS — C799 Secondary malignant neoplasm of unspecified site: Secondary | ICD-10-CM | POA: Diagnosis not present

## 2021-02-02 DIAGNOSIS — C3492 Malignant neoplasm of unspecified part of left bronchus or lung: Secondary | ICD-10-CM | POA: Diagnosis not present

## 2021-02-02 DIAGNOSIS — C7931 Secondary malignant neoplasm of brain: Secondary | ICD-10-CM | POA: Diagnosis not present

## 2021-02-02 DIAGNOSIS — J90 Pleural effusion, not elsewhere classified: Secondary | ICD-10-CM | POA: Diagnosis not present

## 2021-02-02 DIAGNOSIS — R55 Syncope and collapse: Secondary | ICD-10-CM | POA: Diagnosis not present

## 2021-02-02 DIAGNOSIS — Z9689 Presence of other specified functional implants: Secondary | ICD-10-CM | POA: Diagnosis not present

## 2021-02-02 LAB — GLUCOSE, CAPILLARY
Glucose-Capillary: 123 mg/dL — ABNORMAL HIGH (ref 70–99)
Glucose-Capillary: 105 mg/dL — ABNORMAL HIGH (ref 70–99)
Glucose-Capillary: 130 mg/dL — ABNORMAL HIGH (ref 70–99)
Glucose-Capillary: 84 mg/dL (ref 70–99)
Glucose-Capillary: 128 mg/dL — ABNORMAL HIGH (ref 70–99)

## 2021-02-02 IMAGING — MR MR HEAD WO/W CM
11 of 14 series · 21 of 48 positions shown · IV contrast (gadavist)
Comparison: [DATE]

CLINICAL DATA: Brain/CNS neoplasm, staging. Metastatic lung cancer.

EXAM:
MRI HEAD WITHOUT AND WITH CONTRAST
TECHNIQUE: Multiplanar, multiecho pulse sequences of the brain and surrounding
structures were obtained without and with intravenous contrast.
CONTRAST:  5.5mL GADAVIST GADOBUTROL 1 MMOL/ML IV SOLN

[Series 2: FLAIR · sagittal · 3.0mm · 0.47mm/px · 1 of 44 slices shown (1 of 2)]
[im 1/44]
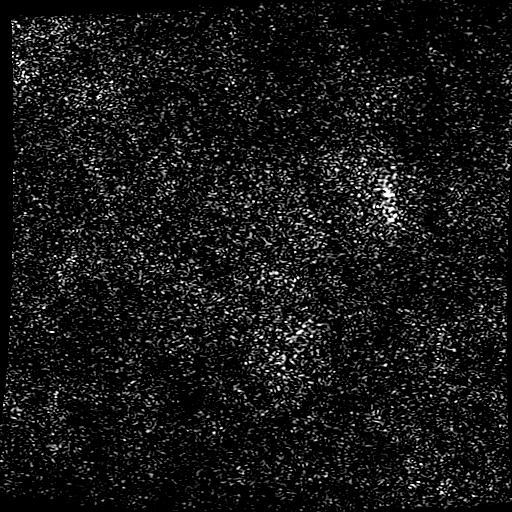

[Series 3: DWI · axial · 3.0mm · 0.94mm/px · z∈[-124,+24]mm · 3 of 106 slices shown]
[im 1/106]
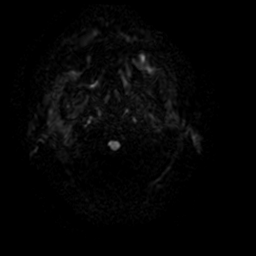
[im 53/106]
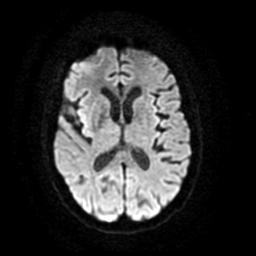
[im 106/106]
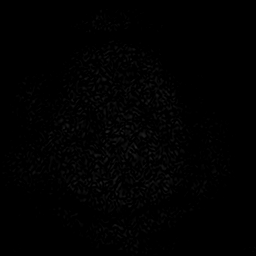

[Series 4: FLAIR · axial · 3.0mm · 0.47mm/px · 1 of 55 slices shown (2 of 2)]
[im 1/55]
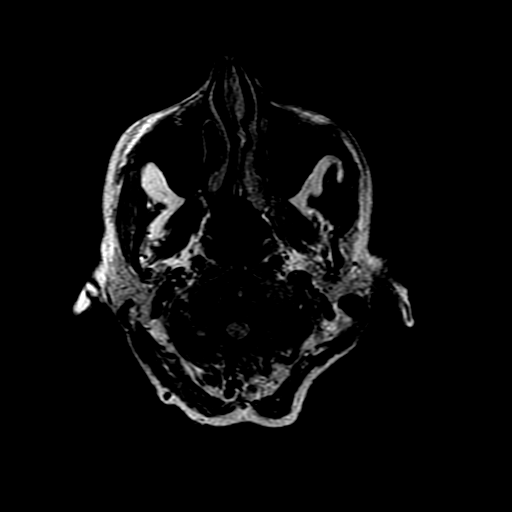

[Series 5: SWI · axial · 3.0mm · 0.47mm/px · z∈[-137,+23]mm · 3 of 112 slices shown (1 of 2)]
[im 1/112]
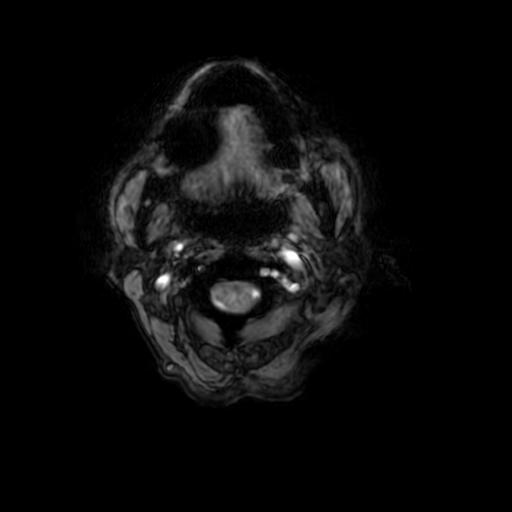
[im 56/112]
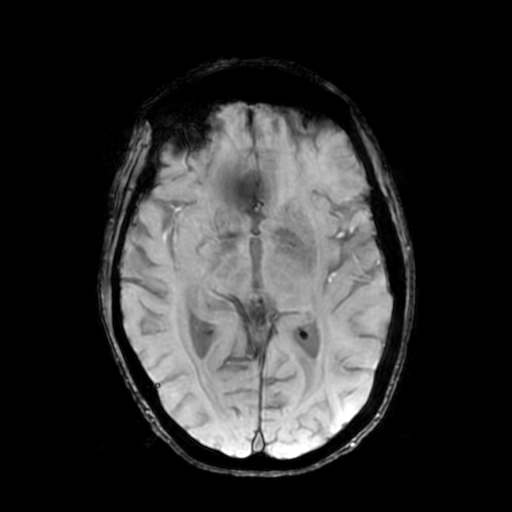
[im 112/112]
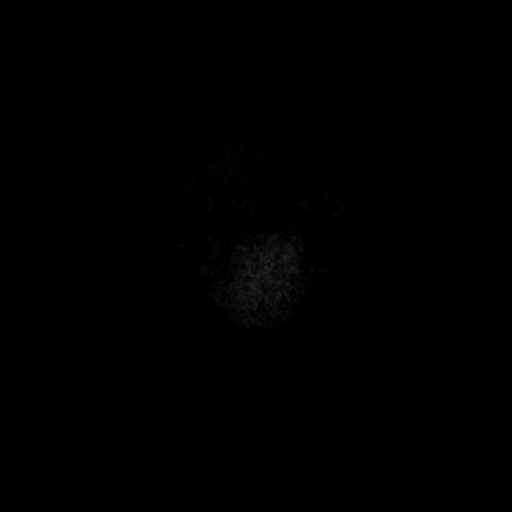

[Series 7: T2 post-contrast · coronal · 3.0mm · 0.39mm/px · 1 of 53 slices shown (1 of 2)]
[im 1/53]
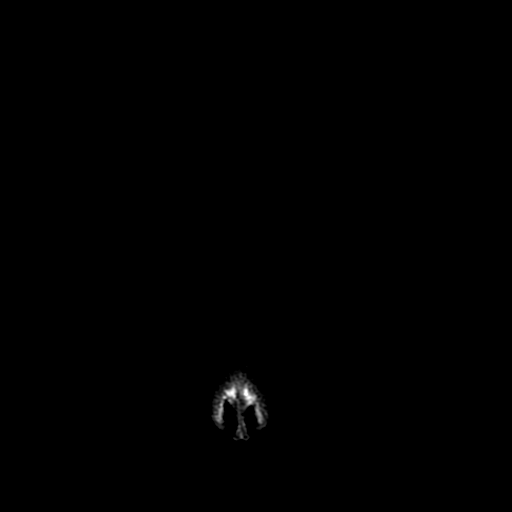

[Series 8: T2 post-contrast · axial · 5.0mm · 0.47mm/px · 1 of 31 slices shown (2 of 2)]
[im 1/31]
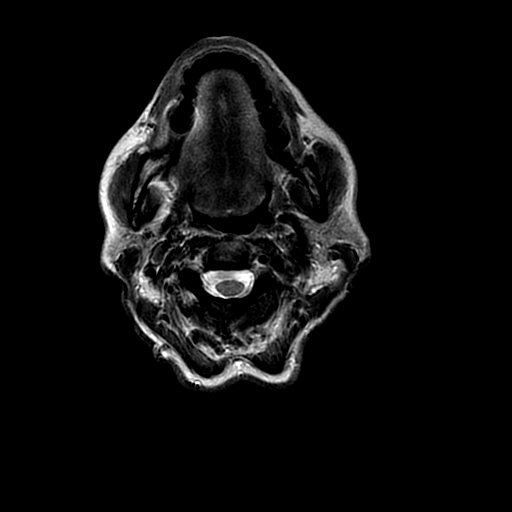

[Series 9: T1 post-contrast · coronal · 3.0mm · 0.39mm/px · 1 of 53 slices shown (1 of 2)]
[im 1/53]
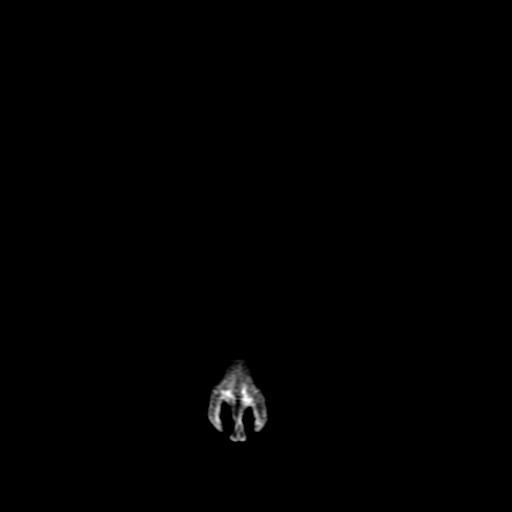

[Series 10: FLAIR post-contrast · sagittal · 3.0mm · 0.47mm/px · 1 of 43 slices shown]
[im 1/43]
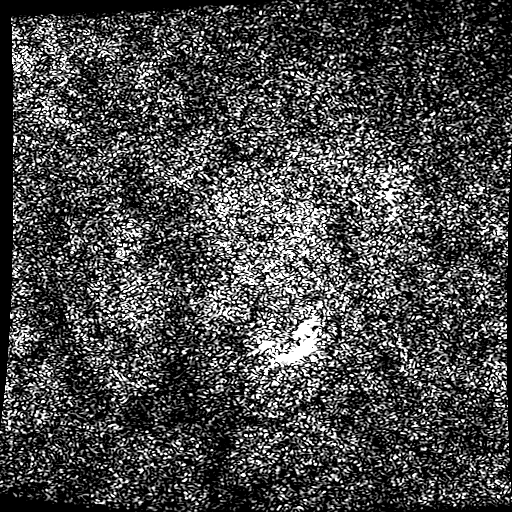

[Series 350: ADC · axial · 3.0mm · 0.94mm/px · 1 of 53 slices shown]
[im 1/53]
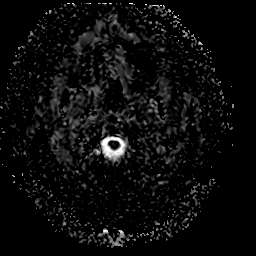

[Series 500: SWI · axial · 3.0mm · 0.47mm/px · 1 of 112 slices shown (2 of 2)]
[im 1/112]
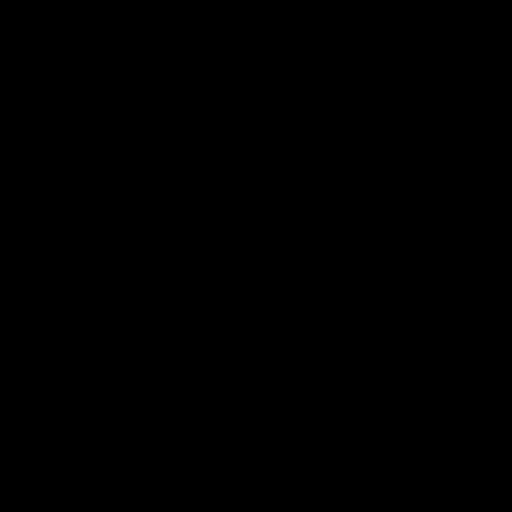

[Series 1100: T1 post-contrast · axial · 0.9mm · 0.50mm/px · z∈[-202,+47]mm · 7 of 298 slices shown (2 of 2)]
[im 1/298]
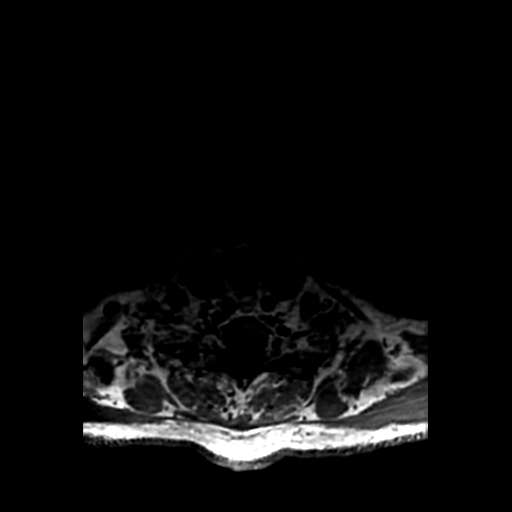
[im 50/298]
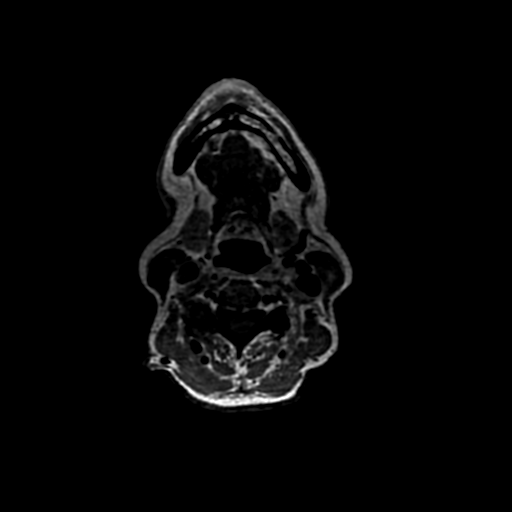
[im 100/298]
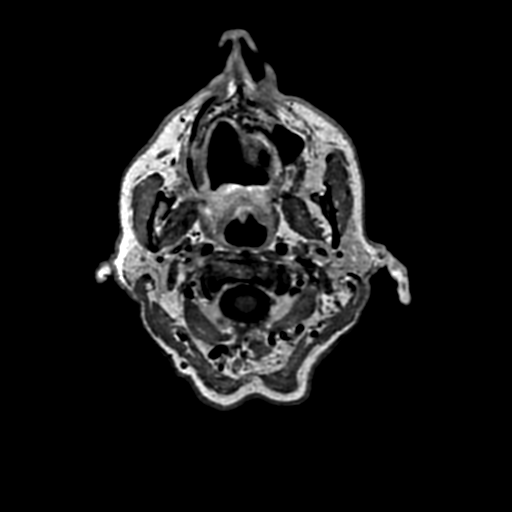
[im 149/298]
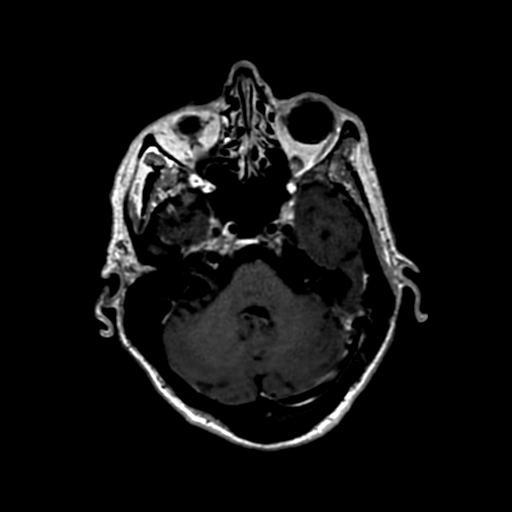
[im 199/298]
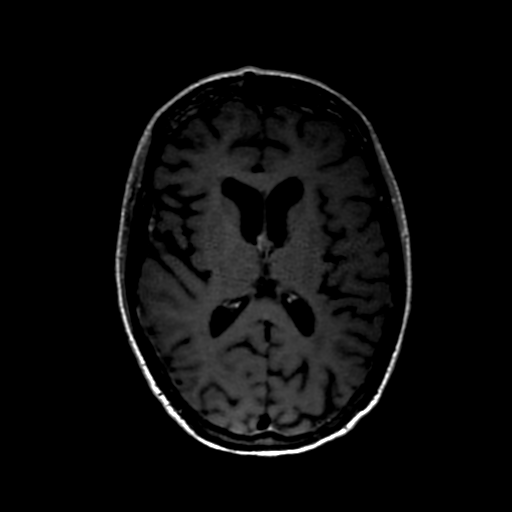
[im 248/298]
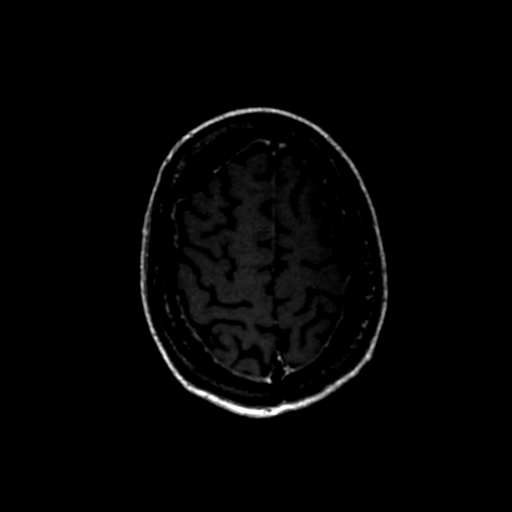
[im 298/298]
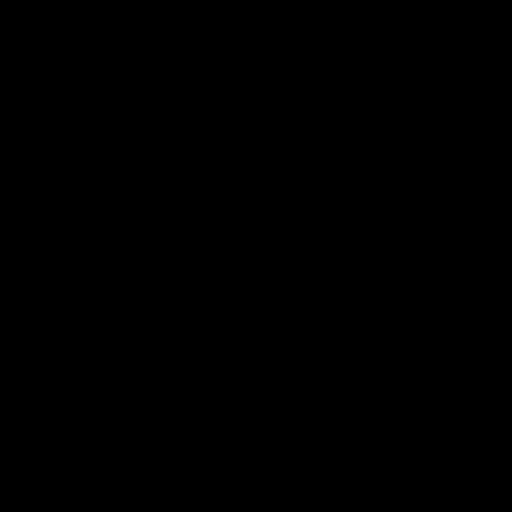

[21 of 48 positions shown; findings below may reference images not displayed]

FINDINGS: Brain: There are a total of 8 subcentimeter enhancing brain lesions,
some of which are more conspicuous and/or slightly larger on today's
3 T MRI compared to the prior 1.5 T study. The lesions are annotated
on series [US] and are located as follows: left cerebellum (image
left frontal lobe (image 190), right frontal lobe (image 218), left
parietal lobe (image 220), left frontal lobe (image 227), and right
frontal lobe (image 231). Mild edema associated with several lesions
is unchanged, greatest about the 5 mm left frontal lesion (image
190).

A few foci of vascular enhancement are noted including in the right
(image 211) and left parietal regions (image 206) and medial right
cerebellum (image 138). No acute infarct, intracranial hemorrhage,
midline shift, or extra-axial fluid collection is evident. Scattered
small T2 hyperintensities in the cerebral white matter and pons are
nonspecific but compatible with mild chronic small vessel ischemic
disease. There is mild cerebral atrophy. A tiny chronic right
cerebellar infarct is noted.

Vascular: Major intracranial vascular flow voids are preserved.

Skull and upper cervical spine: Unremarkable bone marrow signal.

Sinuses/Orbits: Unremarkable orbits. Minimal mucosal thickening in
the ethmoid sinuses. Clear mastoid air cells.

Other: None.
IMPRESSION: 1. 8 subcentimeter brain metastases with up to mild edema.
2. Mild chronic small vessel ischemic disease.

## 2021-02-02 MED ORDER — DIPHENHYDRAMINE-ZINC ACETATE 2-0.1 % EX CREA
TOPICAL_CREAM | CUTANEOUS | Status: DC | PRN
  Filled 2021-02-02: qty 28

## 2021-02-02 MED ORDER — GADOBUTROL 1 MMOL/ML IV SOLN
5.5000 mL | Freq: Once | INTRAVENOUS | Status: AC | PRN
  Administered 2021-02-02: 5.5 mL via INTRAVENOUS

## 2021-02-02 MED ORDER — SODIUM CHLORIDE 0.9 % IV SOLN: INTRAVENOUS | Status: DC

## 2021-02-02 NOTE — Telephone Encounter (Signed)
Spoke with MRI tech at [facility] about the IP order placed for a 3T SRS Protocol MRI for radiation treatment planning. They are unable to see the order on their work queue but now have the patient information and will arrange transport via [place] with the pt's [facility] IP nurse.   Mont Dutton R.T.(R)(T) Radiation Special Procedures Navigator

## 2021-02-02 NOTE — Care Management Important Message (Signed)
Important Message  Patient Details IM Letter given to the Patient. Name: Tami Stone MRN: [id number] Date of Birth: 1951-03-13   Medicare Important Message Given:  Yes     Kerin Salen 02/02/2021, 11:01 AM

## 2021-02-02 NOTE — Plan of Care (Signed)
  Problem: Education: Goal: Knowledge of General Education information will improve Description: Including pain rating scale, medication(s)/side effects and non-pharmacologic comfort measures Outcome: Progressing   Problem: Health Behavior/Discharge Planning: Goal: Ability to manage health-related needs will improve Outcome: Progressing   Problem: Clinical Measurements: Goal: Respiratory complications will improve Outcome: Progressing   

## 2021-02-02 NOTE — Progress Notes (Signed)
[name] is about the same.  She still has a chest tube in the left side.  The chest x-ray yesterday really did not show any change in the hydropneumothorax.  I suspect that she will always have this.  Hopefully, the chest tube will come out today.  She had a "virtual" visit with Radiation Oncology.  They are supposed to set up a 3T MRI for her for planning with the stereotactic radiosurgery.  I am not sure when this will be done.  It may have to be done as an outpatient.  She has had some pain with the chest tube site.  I am really not surprised by this.  There has been no issues with nausea or vomiting.  She says her diarrhea is better.  She is not having problems with cough.  There is no hemoptysis.  She is back on the [place] and Tafinlar.  We have to do immunotherapy as an outpatient.  I realize that she also has the breast cancer.  This really is not that much of an issue right now.  She should be taking her Femara.  I am not sure she is actually doing this.  I do not see any lab work back yet today.  She is little bit dehydrated.  She is worried about this.  I would go ahead and get her started on some IV fluids.  Her vital signs are all stable.  Her temperature is 97.5.  Pulse 82.  Blood pressure 118/84.  Oxygen saturation room air is 97%.  Her lungs she does have decent air movement on the right side.  Left side also has good air movement.  I do not hear any wheezes.  Cardiac exam is regular rate and rhythm.  Abdomen is soft.  She has decent bowel sounds.  There is no palpable liver or spleen tip.  Extremities shows no clubbing, cyanosis or edema.  Neurological exam is nonfocal.  [name] has a metastatic lung cancer.  This is her real problem.  She now has brain mets.  She has had these treated.  It sounds like this we done as an outpatient.  She is a good candidate for the SBRT.  Hopefully, the chest tube will come out today.  We will start on some IV fluid.  I will  make sure she has some lab work done tomorrow.  I know she is getting outstanding care from all the staff up on [address].   Lattie Haw, MD  [place]brew[place] 12:12

## 2021-02-02 NOTE — Progress Notes (Signed)
[facility]  PROGRESS NOTE  Tami Stone QIW:[id number] DOB: Mar 18, 1951 DOA: 01/28/2021 PCP: Gaynelle Cage, MD   Brief HPI:   70 year old female with history of metastatic adenocarcinoma of the lung, invasive ductal carcinoma of the breast, malignant pleural effusion, pericardial effusion presented with weakness.  Also passed out with tremors at home.  Chest x-ray showed large hydropneumothorax.  PCCM was consulted and chest tube was placed.  CT head showed ill-defined hypodensity in the frontal region.  MRI brain showed small multiple metastasis with mild surrounding edema.    Subjective   Denies shortness of breath, chest pain.  Plan for 3T MRI today for stereotactic radiosurgery as per radiation oncology.   Assessment/Plan:    Syncope/?  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 twice daily; obtained EEG-mild diffuse encephalopathy, nonspecific etiology.  No seizures or epileptiform discharges seen. -Also started on Decadron 8 mg p.o. twice daily, discussed with Dr. Marin Olp -Radiation oncology has seen the patient and plan for 3T MRI today for stereotactic radiosurgery  Malignant pleural effusion -Chest x-ray showed left-sided hydropneumothorax -Chest tube placed per PCCM -Dr. Silas Flood recommends continuing chest tube till discharge. -As per PCCM, recent admission 8/8-01/2011 for dyspnea and cough, noted to have large left pleural effusion with midline shift to the right, s/p 2.4 L thoracentesis by IR, cytology consistent with malignant cells-metastatic adenocarcinoma.  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   History of pericardial effusion -Seen on echo on 01/18/2021 -Asymptomatic -Continue to  monitor  Hypothyroidism -Continue NP thyroid  Diabetes mellitus type 2 -CBG has been well controlled; hemoglobin A1c 6.2 -Continue metformin -Patient wants to continue metformin in the hospital   Scheduled medications:    Chlorhexidine Gluconate Cloth  6 each Topical Daily   dabrafenib mesylate  100 mg Oral BID   dexamethasone  8 mg Oral Q12H   dronabinol  5 mg Oral BID AC   feeding supplement  237 mL Oral TID BM   letrozole  2.5 mg Oral Daily   levETIRAcetam  500 mg Oral BID   metFORMIN  750 mg Oral BID WC   senna  1 tablet Oral Daily   sodium chloride flush  10 mL Intracatheter Q8H   sodium chloride flush  3 mL Intravenous Q12H   thyroid  60 mg Oral Daily   trametinib dimethyl sulfoxide  2 mg Oral Daily         Data Reviewed:   CBG:  Recent Labs  Lab 02/01/21 1108 02/01/21 1618 02/01/21 2043 02/02/21 0513 02/02/21 0744  GLUCAP 94 171* 105* 123* 105*    SpO2: 97 %    Vitals:   02/01/21 0500 02/01/21 1218 02/01/21 2046 02/02/21 0511  BP: 104/67 124/79 116/86 118/84  Pulse: 87 89 85 82  Resp: 20 (!) 24 16 17   Temp: 97.8 F (36.6 C) 98 F (36.7 C) 98.1 F (36.7 C) (!) 97.5 F (36.4 C)  TempSrc: Oral Oral Oral Oral  SpO2: 98% 98% 96% 97%  Weight: 53.3 kg     Height:         Intake/Output Summary (Last 24 hours) at 02/02/2021 1003 Last data filed at 02/02/2021 0600 Gross per 24 hour  Intake 600 ml  Output 415 ml  Net 185 ml    08/21  1901 - 08/23 0700 In: 990 [P.O.:840; I.V.:50] Out: 505 [Urine:200]  Filed Weights   01/28/21 1228 01/31/21 0518 02/01/21 0500  Weight: 60 kg 53.7 kg 53.3 kg    CBC:  Recent Labs  Lab 01/28/21 1301 01/29/21 0737 01/30/21 0210 02/01/21 0437  WBC 8.6 6.1 5.5 7.3  HGB 10.4* 10.1* 11.2* 10.5*  HCT 32.6* 31.9* 35.3* 34.0*  PLT 329 305 323 381  MCV 86.9 86.2 86.3 88.5  MCH 27.7 27.3 27.4 27.3  MCHC 31.9 31.7 31.7 30.9  RDW 15.7* 15.5 14.9 15.2  LYMPHSABS 0.7  --   --   --   MONOABS 0.3  --   --   --    EOSABS 0.0  --   --   --   BASOSABS 0.0  --   --   --     Complete metabolic panel:  Recent Labs  Lab 01/28/21 1301 01/29/21 0737 01/30/21 0210 01/30/21 1158 02/01/21 0437  NA 133* 132* 134*  --  138  K 3.8 3.7 4.2  --  4.5  CL 100 100 101  --  104  CO2 26 26 24   --  26  GLUCOSE 143* 108* 128*  --  129*  BUN 22 14 10   --  24*  CREATININE 0.64 0.50 0.45  --  0.55  CALCIUM 8.0* 7.8* 8.3*  --  8.5*  AST 18 15  --   --   --   ALT 9 10  --   --   --   ALKPHOS 79 72  --   --   --   BILITOT 0.5 0.2*  --   --   --   ALBUMIN 2.5* 2.3*  --   --   --   HGBA1C  --   --   --  6.2*  --     No results for input(s): LIPASE, AMYLASE in the last 168 hours.  Recent Labs  Lab 01/28/21 1301  SARSCOV2NAA NEGATIVE    ------------------------------------------------------------------------------------------------------------------ No results for input(s): CHOL, HDL, LDLCALC, TRIG, CHOLHDL, LDLDIRECT in the last 72 hours.  Lab Results  Component Value Date   HGBA1C 6.2 (H) 01/30/2021   ------------------------------------------------------------------------------------------------------------------ No results for input(s): TSH, T4TOTAL, T3FREE, THYROIDAB in the last 72 hours.  Invalid input(s): FREET3 ------------------------------------------------------------------------------------------------------------------ No results for input(s): VITAMINB12, FOLATE, FERRITIN, TIBC, IRON, RETICCTPCT in the last 72 hours.  Coagulation profile No results for input(s): INR, PROTIME in the last 168 hours. No results for input(s): DDIMER in the last 72 hours.  Cardiac Enzymes No results for input(s): CKTOTAL, CKMB, CKMBINDEX, TROPONINI in the last 168 hours.  ------------------------------------------------------------------------------------------------------------------    Component Value Date/Time   BNP 16.6 01/18/2021 1400     Antibiotics: Anti-infectives (From admission, onward)     None        Radiology Reports  DG Chest 1 View  Result Date: 02/01/2021 CLINICAL DATA:  Left chest tube. EXAM: CHEST  1 VIEW COMPARISON:  01/31/2021 and CT chest 01/19/2021. FINDINGS: Trachea is midline. Heart size stable. Persistent left lower lobe collapse/consolidation. Small to moderate left hydropneumothorax with a small bore drain in place, as before. Associated subcutaneous emphysema in the left axillary region. Right lung is clear. IMPRESSION: 1. Small to moderate left hydropneumothorax with drain in place, unchanged. 2. Persistent left lower lobe collapse/consolidation, possibly representing pneumonia. Electronically Signed   By: Lorin Picket M.D.   On: 02/01/2021 11:23      DVT prophylaxis: SCDs  Code Status: Full code  Family Communication: No family at bedside   Consultants: PCCM Oncology  Procedures:     Objective    Physical Examination:  General-appears in no acute distress Heart-S1-S2, regular, no murmur auscultated Lungs-clear to auscultation bilaterally, no wheezing or crackles auscultated, chest tube in place Abdomen-soft, nontender, no organomegaly Extremities-no edema in the lower extremities Neuro-alert, oriented x3, no focal deficit noted  Status is: Inpatient  Dispo: The patient is from: Home              Anticipated d/c is to: Home              Anticipated d/c date is: 02/03/2021              Patient currently not stable for discharge  Barrier to discharge-ongoing evaluation for seizure, brain metastasis  COVID-19 Labs  No results for input(s): DDIMER, FERRITIN, LDH, CRP in the last 72 hours.  Lab Results  Component Value Date   [place] NEGATIVE 01/28/2021   [place] NEGATIVE 01/18/2021   [place] NEGATIVE 04/29/2020    Microbiology  Recent Results (from the past 240 hour(s))  Resp Panel by RT-PCR (Flu A&B, Covid) Nasopharyngeal Swab     Status: None   Collection Time: 01/28/21  1:01 PM   Specimen: Nasopharyngeal  Swab; Nasopharyngeal(NP) swabs in vial transport medium  Result Value Ref Range Status   SARS Coronavirus 2 by RT PCR NEGATIVE NEGATIVE Final    Comment: (NOTE) SARS-CoV-2 target nucleic acids are NOT DETECTED.  The SARS-CoV-2 RNA is generally detectable in upper respiratory specimens during the acute phase of infection. The lowest concentration of SARS-CoV-2 viral copies this assay can detect is 138 copies/mL. A negative result does not preclude SARS-Cov-2 infection and should not be used as the sole basis for treatment or other patient management decisions. A negative result may occur with  improper specimen collection/handling, submission of specimen other than nasopharyngeal swab, presence of viral mutation(s) within the areas targeted by this assay, and inadequate number of viral copies(<138 copies/mL). A negative result must be combined with clinical observations, patient history, and epidemiological information. The expected result is Negative.  Fact Sheet for Patients:  EntrepreneurPulse.com.au  Fact Sheet for Healthcare Providers:  IncredibleEmployment.be  This test is no t yet approved or cleared by the [place] FDA and  has been authorized for detection and/or diagnosis of SARS-CoV-2 by FDA under an Emergency Use Authorization (EUA). This EUA will remain  in effect (meaning this test can be used) for the duration of the COVID-19 declaration under Section 564(b)(1) of the Act, 21 U.S.C.section 360bbb-3(b)(1), unless the authorization is terminated  or revoked sooner.       Influenza A by PCR NEGATIVE NEGATIVE Final   Influenza B by PCR NEGATIVE NEGATIVE Final    Comment: (NOTE) The Xpert Xpress SARS-CoV-2/FLU/RSV plus assay is intended as an aid in the diagnosis of influenza from Nasopharyngeal swab specimens and should not be used as a sole basis for treatment. Nasal washings and aspirates are unacceptable for Xpert Xpress  SARS-CoV-2/FLU/RSV testing.  Fact Sheet for Patients: EntrepreneurPulse.com.au  Fact Sheet for Healthcare Providers: IncredibleEmployment.be  This test is not yet approved or cleared by the [place] FDA and has been authorized for detection and/or diagnosis of SARS-CoV-2 by FDA under an Emergency Use Authorization (EUA). This EUA will remain in effect (meaning this test can be used) for the duration of the COVID-19 declaration under Section 564(b)(1) of the Act, 21 U.S.C. section 360bbb-3(b)(1), unless the authorization is terminated  or revoked.  Performed at [facility], [place] [address] [place], [place] [address]   MRSA Next Gen by PCR, Nasal     Status: None   Collection Time: 01/29/21  6:53 PM   Specimen: Nasal Mucosa; Nasal Swab  Result Value Ref Range Status   MRSA by PCR Next Gen NOT DETECTED NOT DETECTED Final    Comment: (NOTE) The GeneXpert MRSA Assay (FDA approved for NASAL specimens only), is one component of a comprehensive MRSA colonization surveillance program. It is not intended to diagnose MRSA infection nor to guide or monitor treatment for MRSA infections. Test performance is not FDA approved in patients less than 46 years old. Performed at [facility], [place] [address], [place], [place] [address]          Oswald Hillock   [facility] If 7PM-7AM, please contact night-coverage at [email or web address], Office  [phone]   02/02/2021, 10:03 AM  LOS: 4 days

## 2021-02-03 DIAGNOSIS — J948 Other specified pleural conditions: Secondary | ICD-10-CM | POA: Diagnosis not present

## 2021-02-03 DIAGNOSIS — Z9689 Presence of other specified functional implants: Secondary | ICD-10-CM | POA: Diagnosis not present

## 2021-02-03 DIAGNOSIS — C7931 Secondary malignant neoplasm of brain: Secondary | ICD-10-CM | POA: Diagnosis not present

## 2021-02-03 DIAGNOSIS — R55 Syncope and collapse: Secondary | ICD-10-CM | POA: Diagnosis not present

## 2021-02-03 LAB — COMPREHENSIVE METABOLIC PANEL
ALT: 12 U/L (ref 0–44)
AST: 16 U/L (ref 15–41)
Albumin: 2.3 g/dL — ABNORMAL LOW (ref 3.5–5.0)
Alkaline Phosphatase: 78 U/L (ref 38–126)
Anion gap: 8 (ref 5–15)
BUN: 18 mg/dL (ref 8–23)
CO2: 27 mmol/L (ref 22–32)
Calcium: 8.7 mg/dL — ABNORMAL LOW (ref 8.9–10.3)
Chloride: 102 mmol/L (ref 98–111)
Creatinine, Ser: 0.51 mg/dL (ref 0.44–1.00)
GFR, Estimated: >60 mL/min (ref >60)
Glucose, Bld: 117 mg/dL — ABNORMAL HIGH (ref 70–99)
Potassium: 4.3 mmol/L (ref 3.5–5.1)
Sodium: 137 mmol/L (ref 135–145)
Total Bilirubin: 0.5 mg/dL (ref 0.3–1.2)
Total Protein: 5.5 g/dL — ABNORMAL LOW (ref 6.5–8.1)

## 2021-02-03 LAB — GLUCOSE, CAPILLARY
Glucose-Capillary: 112 mg/dL — ABNORMAL HIGH (ref 70–99)
Glucose-Capillary: 83 mg/dL (ref 70–99)
Glucose-Capillary: 153 mg/dL — ABNORMAL HIGH (ref 70–99)
Glucose-Capillary: 106 mg/dL — ABNORMAL HIGH (ref 70–99)

## 2021-02-03 LAB — CBC WITH DIFFERENTIAL/PLATELET
Abs Immature Granulocytes: 0.06 10*3/uL (ref 0.00–0.07)
Basophils Absolute: 0 10*3/uL (ref 0.0–0.1)
Basophils Relative: 0 %
Eosinophils Absolute: 0.1 10*3/uL (ref 0.0–0.5)
Eosinophils Relative: 1 %
HCT: 34.8 % — ABNORMAL LOW (ref 36.0–46.0)
Hemoglobin: 10.7 g/dL — ABNORMAL LOW (ref 12.0–15.0)
Immature Granulocytes: 1 %
Lymphocytes Relative: 21 %
Lymphs Abs: 2.1 10*3/uL (ref 0.7–4.0)
MCH: 27.2 pg (ref 26.0–34.0)
MCHC: 30.7 g/dL (ref 30.0–36.0)
MCV: 88.5 fL (ref 80.0–100.0)
Monocytes Absolute: 0.4 10*3/uL (ref 0.1–1.0)
Monocytes Relative: 4 %
Neutro Abs: 7.1 10*3/uL (ref 1.7–7.7)
Neutrophils Relative %: 73 %
Platelets: 384 10*3/uL (ref 150–400)
RBC: 3.93 MIL/uL (ref 3.87–5.11)
RDW: 15.1 % (ref 11.5–15.5)
WBC: 9.8 10*3/uL (ref 4.0–10.5)
nRBC: 0 % (ref 0.0–0.2)

## 2021-02-03 LAB — LACTATE DEHYDROGENASE: LDH: 104 U/L (ref 98–192)

## 2021-02-03 NOTE — Progress Notes (Signed)
The patient's MRI scan was read, fortunately the only new findings were small subcentimeter lesions so her total number of lesions in the brain are 8 separate sites.  Dr. Lisbeth Renshaw believes that she is still a good candidate for stereotactic radiosurgery in a single fraction.  We reviewed by phone the risks, benefits, short and long-term effects of radiotherapy, and the patient is interested in proceeding.  She is on the schedule tomorrow at 1230 for simulation at which time we will make an immobilizing mass.  She will meet our special procedures navigator as well who has scheduled an outpatient appointment on Tuesday with Dr. Duffy Rhody in neurosurgery.  We reviewed the tentative schedule for her treatment next Thursday as well.  She is in agreement with this plan.    Carola Rhine, PAC

## 2021-02-03 NOTE — Progress Notes (Signed)
PROGRESS NOTE    Tami Stone  DDU:[id number] DOB: [birth year] DOA: 01/28/2021 PCP: Gaynelle Cage, MD   Brief Narrative:  Patient is a 70 year old chronically ill-appearing Caucasian female with past medical history significant for but not limited to metastatic adenocarcinoma of the lung, invasive ductal carcinoma of the breast, history of malignant pleural effusion, pericardial effusion as well as other comorbidities who presented with generalized weakness.  She is also noted to have been passed out at home with some tremors.  Initial work-up revealed that she has a large hydropneumothorax and pulmonary consulted and chest tube was placed.  CT of the head was done and showed an ill-defined hypodensity in frontal region.  MRI of the brain showed multiple small metastasis with surrounding edema.  She underwent a 3T MRI yesterday for stereotactic radiosurgery evaluation and she is seen like she is a good candidate.  Currently on dexamethasone and Keppra.  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 twice daily; obtained EEG-mild diffuse encephalopathy, nonspecific etiology.  No seizures or epileptiform discharges seen. -Also started on Decadron 8 mg p.o. twice daily,  -Radiation oncology has seen the patient and plan for 3T MRI today for stereotactic radiosurgery; the MRI was read unfortunately only 1 new findings were small subcentimeter lesions of the total number of lesions in the brain are 8 separate sites -Radiation oncology believes the patient would be a good candidate for stereotactic radiosurgery with single fraction and she is going to be proceeding and be scheduled for  1230 simulation 02/04/2021 -She is to have a outpatient appointment with Dr. 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 Silas Flood recommends continuing chest tube till discharge. -As per PCCM, recent admission 8/8-01/2011 for dyspnea and cough, noted to have large left pleural effusion with midline shift to the right, s/p 2.4 L thoracentesis by IR, cytology consistent with malignant cells-metastatic adenocarcinoma. -Patient does have metastatic lung cancer and is back on the [place] and Tafinlar -Per PCCM when she is ready for discharge still discontinued chest tube at that point  Invasive ductal carcinoma of the breast -Sees oncology Dr. Marin Olp -Continue with letrozole 2.5 mg p.o. daily   Normocytic Anemia -Hemoglobin/hematocrit is now 10.7/34.8 -Check anemia panel in the a.m -Continue to monitor for signs and symptoms of bleeding; currently no overt bleeding noted -Repeat CBC in a.m.   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 -Continue to Monitor and Trend and repeat CMP in the AM     History of Pericardial Effusion -Seen on echo on 01/18/2021 and noted to be Moderate without no grossly evidence of Tamponade -Asymptomatic -Continue to Monitor   Hypothyroidism -Continue Armour thyroid 60 mg po Daily    Diabetes Mellitus Type 2 -CBG has been well controlled; hemoglobin A1c 6.2 -Continue metformin as Patient wants to continue metformin in the hospital -CBG's ranging from 83-153   DVT prophylaxis: SCDs Code Status: Full code Family Communication: No family  present at bedside Disposition Plan: Pending further clinical work-up and clearance by specialists including  radiation oncology as well as medical oncology  Status is: Inpatient  Remains inpatient appropriate because:Unsafe d/c plan, IV treatments appropriate due to intensity of illness or inability to take PO, and Inpatient level of care appropriate due to severity of illness  Dispo: The patient is from: Home              Anticipated d/c is to:  TBD              Patient currently is not medically stable to be discharged home   Difficult to place patient No  Consultants:  Palliative care medicine Pulmonary Radiation oncology Medical oncology  Procedures:   Antimicrobials:  Anti-infectives (From admission, onward)    None        Subjective: Seen and examined at bedside and states that she felt fatigued and weak from her Keppra.  Getting dexamethasone.  No nausea or vomiting.  Asking about her MRI yesterday but was not fully ready yet.  No chest pain or shortness of breath.  No other concerns or complaints at this time.  Objective: Vitals:   02/02/21 2007 02/03/21 0246 02/03/21 0600 02/03/21 0808  BP: 113/73 128/88  121/88  Pulse: 86 86  78  Resp: 18 18    Temp: 98.1 F (36.7 C) 97.8 F (36.6 C)  98.2 F (36.8 C)  TempSrc: Oral Oral  Oral  SpO2: 97% 98%  99%  Weight:   53.9 kg   Height:        Intake/Output Summary (Last 24 hours) at 02/03/2021 6333 Last data filed at 02/03/2021 0600 Gross per 24 hour  Intake 1529.5 ml  Output 80 ml  Net 1449.5 ml   Filed Weights   01/31/21 0518 02/01/21 0500 02/03/21 0600  Weight: 53.7 kg 53.3 kg 53.9 kg   Examination: Physical Exam:  Constitutional: WN/WD chronically ill-appearing Caucasian female currently no acute distress Eyes: Lids and conjunctivae normal, sclerae anicteric  ENMT: External Ears, Nose appear normal. Grossly normal hearing.  Neck: Appears normal, supple, no cervical masses, normal ROM, no appreciable thyromegaly; no JVD Respiratory: Diminished to auscultation bilaterally with coarse breath sounds and has a  left-sided chest tube, no wheezing, rales, rhonchi or crackles. Normal respiratory effort and patient is not tachypenic.  Unlabored breathing Cardiovascular: RRR, no murmurs / rubs / gallops. S1 and S2 auscultated. No extremity edema. Abdomen: Soft, non-tender, non-distended. bowel sounds positive.  GU: Deferred. Musculoskeletal: No clubbing / cyanosis of digits/nails. No joint deformity upper and lower extremities Skin: No rashes, lesions, ulcers limited skin evaluation. No induration; Warm and dry.  Neurologic: CN 2-12 grossly intact with no focal deficits. Romberg sign and cerebellar reflexes not assessed.  Psychiatric: Normal judgment and insight. Alert and oriented x 3. Normal mood and appropriate affect.   Data Reviewed: I have personally reviewed following labs and imaging studies  CBC: Recent Labs  Lab 01/28/21 1301 01/29/21 0737 01/30/21 0210 02/01/21 0437 02/03/21 0509  WBC 8.6 6.1 5.5 7.3 9.8  NEUTROABS 7.5  --   --   --  7.1  HGB 10.4* 10.1* 11.2* 10.5* 10.7*  HCT 32.6* 31.9* 35.3* 34.0* 34.8*  MCV 86.9 86.2 86.3 88.5 88.5  PLT 329 305 323 381 545   Basic Metabolic Panel: Recent Labs  Lab 01/28/21 1301 01/29/21 0737 01/30/21 0210 02/01/21 0437 02/03/21 0509  NA 133* 132* 134* 138 137  K 3.8 3.7 4.2 4.5 4.3  CL 100 100 101 104 102  CO2 26 26 24 26 27   GLUCOSE 143* 108* 128* 129* 117*  BUN 22 14 10  24* 18  CREATININE 0.64 0.50 0.45 0.55 0.51  CALCIUM 8.0* 7.8* 8.3* 8.5* 8.7*   GFR: Estimated Creatinine Clearance: 51 mL/min (by C-G formula based on SCr of 0.51 mg/dL). Liver Function Tests: Recent Labs  Lab 01/28/21 1301 01/29/21 0737 02/03/21 0509  AST 18 15 16   ALT 9 10 12   ALKPHOS 79 72 78  BILITOT 0.5 0.2* 0.5  PROT 5.9* 5.4* 5.5*  ALBUMIN 2.5* 2.3* 2.3*   No results for input(s): LIPASE, AMYLASE in the last 168 hours. No results for input(s): AMMONIA in the last 168 hours. Coagulation Profile: No results for input(s): INR, PROTIME in the last  168 hours. Cardiac Enzymes: No results for input(s): CKTOTAL, CKMB, CKMBINDEX, TROPONINI in the last 168 hours. BNP (last 3 results) No results for input(s): PROBNP in the last 8760 hours. HbA1C: No results for input(s): HGBA1C in the last 72 hours. CBG: Recent Labs  Lab 02/02/21 1111 02/02/21 1734 02/02/21 2005 02/03/21 0626 02/03/21 0805  GLUCAP 130* 84 128* 112* 83   Lipid Profile: No results for input(s): CHOL, HDL, LDLCALC, TRIG, CHOLHDL, LDLDIRECT in the last 72 hours. Thyroid Function Tests: No results for input(s): TSH, T4TOTAL, FREET4, T3FREE, THYROIDAB in the last 72 hours. Anemia Panel: No results for input(s): VITAMINB12, FOLATE, FERRITIN, TIBC, IRON, RETICCTPCT in the last 72 hours. Sepsis Labs: No results for input(s): PROCALCITON, LATICACIDVEN in the last 168 hours.  Recent Results (from the past 240 hour(s))  Resp Panel by RT-PCR (Flu A&B, Covid) Nasopharyngeal Swab     Status: None   Collection Time: 01/28/21  1:01 PM   Specimen: Nasopharyngeal Swab; Nasopharyngeal(NP) swabs in vial transport medium  Result Value Ref Range Status   SARS Coronavirus 2 by RT PCR NEGATIVE NEGATIVE Final    Comment: (NOTE) SARS-CoV-2 target nucleic acids are NOT DETECTED.  The SARS-CoV-2 RNA is generally detectable in upper respiratory specimens during the acute phase of infection. The lowest concentration of SARS-CoV-2 viral copies this assay can detect is 138 copies/mL. A negative result does not preclude SARS-Cov-2 infection and should not be used as the sole basis for treatment or other patient management decisions. A negative result may occur with  improper specimen collection/handling, submission of specimen other than nasopharyngeal swab, presence of viral mutation(s) within the areas targeted by this assay, and inadequate number of viral copies(<138 copies/mL). A negative result must be combined with clinical observations, patient history, and  epidemiological information. The expected result is Negative.  Fact Sheet for Patients:  EntrepreneurPulse.com.au  Fact Sheet for Healthcare Providers:  IncredibleEmployment.be  This test is no t yet approved or cleared by the [place] FDA and  has been authorized for detection and/or diagnosis of SARS-CoV-2 by FDA under an Emergency Use Authorization (EUA). This EUA will remain  in effect (meaning this test can be used) for the duration of the COVID-19 declaration under Section 564(b)(1) of the Act, 21 U.S.C.section 360bbb-3(b)(1), unless the authorization is terminated  or revoked sooner.       Influenza A by PCR NEGATIVE NEGATIVE Final   Influenza B by PCR NEGATIVE NEGATIVE Final    Comment: (NOTE) The Xpert Xpress SARS-CoV-2/FLU/RSV plus assay is intended as an aid in the diagnosis of influenza from Nasopharyngeal swab specimens and should not be used as a sole basis for treatment. Nasal washings and aspirates are unacceptable for  Xpert Xpress SARS-CoV-2/FLU/RSV testing.  Fact Sheet for Patients: EntrepreneurPulse.com.au  Fact Sheet for Healthcare Providers: IncredibleEmployment.be  This test is not yet approved or cleared by the [place] FDA and has been authorized for detection and/or diagnosis of SARS-CoV-2 by FDA under an Emergency Use Authorization (EUA). This EUA will remain in effect (meaning this test can be used) for the duration of the COVID-19 declaration under Section 564(b)(1) of the Act, 21 U.S.C. section 360bbb-3(b)(1), unless the authorization is terminated or revoked.  Performed at [facility] [place] [address] [place], [place] [address]   MRSA Next Gen by PCR, Nasal     Status: None   Collection Time: 01/29/21  6:53 PM   Specimen: Nasal Mucosa; Nasal Swab  Result Value Ref Range Status   MRSA by PCR Next Gen NOT DETECTED NOT DETECTED Final     Comment: (NOTE) The GeneXpert MRSA Assay (FDA approved for NASAL specimens only), is one component of a comprehensive MRSA colonization surveillance program. It is not intended to diagnose MRSA infection nor to guide or monitor treatment for MRSA infections. Test performance is not FDA approved in patients less than 49 years old. Performed at [facility], [place] [address], [place], [place] [address]      RN Pressure Injury Documentation:     Estimated body mass index is 21.73 kg/m as calculated from the following:   Height as of this encounter: 5\' 2"  (1.575 m).   Weight as of this encounter: 53.9 kg.  Malnutrition Type:  Nutrition Problem: Inadequate oral intake Etiology: poor appetite  Malnutrition Characteristics:  Signs/Symptoms: per patient/family report  Nutrition Interventions:  Interventions: Ensure Enlive (each supplement provides 350kcal and 20 grams of protein), MVI   Radiology Studies: No results found.  Scheduled Meds:  Chlorhexidine Gluconate Cloth  6 each Topical Daily   dabrafenib mesylate  100 mg Oral BID   dexamethasone  8 mg Oral Q12H   dronabinol  5 mg Oral BID AC   feeding supplement  237 mL Oral TID BM   letrozole  2.5 mg Oral Daily   levETIRAcetam  500 mg Oral BID   metFORMIN  750 mg Oral BID WC   senna  1 tablet Oral Daily   sodium chloride flush  10 mL Intracatheter Q8H   sodium chloride flush  3 mL Intravenous Q12H   thyroid  60 mg Oral Daily   trametinib dimethyl sulfoxide  2 mg Oral Daily   Continuous Infusions:  sodium chloride 10 mL/hr at 01/29/21 0905   sodium chloride 75 mL/hr at 02/03/21 0201    LOS: 5 days   Kerney Elbe, DO [facility] PAGER is on AMION  If 7PM-7AM, please contact night-coverage [email or web address]

## 2021-02-03 NOTE — Plan of Care (Signed)

## 2021-02-04 ENCOUNTER — Inpatient Hospital Stay (HOSPITAL_COMMUNITY): Payer: Medicare Other

## 2021-02-04 ENCOUNTER — Ambulatory Visit
Admission: RE | Admit: 2021-02-04 | Discharge: 2021-02-04 | Disposition: A | Discharge status: Home / Self Care | Payer: Medicare Other | Source: Ambulatory Visit | Attending: Radiation Oncology | Admitting: Radiation Oncology

## 2021-02-04 DIAGNOSIS — C3490 Malignant neoplasm of unspecified part of unspecified bronchus or lung: Secondary | ICD-10-CM | POA: Diagnosis not present

## 2021-02-04 DIAGNOSIS — Z170 Estrogen receptor positive status [ER+]: Secondary | ICD-10-CM | POA: Insufficient documentation

## 2021-02-04 DIAGNOSIS — C50512 Malignant neoplasm of lower-outer quadrant of left female breast: Secondary | ICD-10-CM | POA: Insufficient documentation

## 2021-02-04 DIAGNOSIS — J948 Other specified pleural conditions: Secondary | ICD-10-CM | POA: Diagnosis not present

## 2021-02-04 DIAGNOSIS — R55 Syncope and collapse: Secondary | ICD-10-CM | POA: Diagnosis not present

## 2021-02-04 DIAGNOSIS — C7931 Secondary malignant neoplasm of brain: Secondary | ICD-10-CM | POA: Insufficient documentation

## 2021-02-04 DIAGNOSIS — Z9689 Presence of other specified functional implants: Secondary | ICD-10-CM | POA: Diagnosis not present

## 2021-02-04 DIAGNOSIS — J90 Pleural effusion, not elsewhere classified: Secondary | ICD-10-CM | POA: Diagnosis not present

## 2021-02-04 DIAGNOSIS — Z510 Encounter for antineoplastic radiation therapy: Secondary | ICD-10-CM | POA: Insufficient documentation

## 2021-02-04 LAB — COMPREHENSIVE METABOLIC PANEL
ALT: 12 U/L (ref 0–44)
AST: 17 U/L (ref 15–41)
Albumin: 2.2 g/dL — ABNORMAL LOW (ref 3.5–5.0)
Alkaline Phosphatase: 80 U/L (ref 38–126)
Anion gap: 7 (ref 5–15)
BUN: 16 mg/dL (ref 8–23)
CO2: 28 mmol/L (ref 22–32)
Calcium: 8.8 mg/dL — ABNORMAL LOW (ref 8.9–10.3)
Chloride: 104 mmol/L (ref 98–111)
Creatinine, Ser: 0.46 mg/dL (ref 0.44–1.00)
GFR, Estimated: >60 mL/min (ref >60)
Glucose, Bld: 136 mg/dL — ABNORMAL HIGH (ref 70–99)
Potassium: 4.3 mmol/L (ref 3.5–5.1)
Sodium: 139 mmol/L (ref 135–145)
Total Bilirubin: 0.1 mg/dL — ABNORMAL LOW (ref 0.3–1.2)
Total Protein: 5.3 g/dL — ABNORMAL LOW (ref 6.5–8.1)

## 2021-02-04 LAB — MAGNESIUM: Magnesium: 1.6 mg/dL — ABNORMAL LOW (ref 1.7–2.4)

## 2021-02-04 LAB — CBC WITH DIFFERENTIAL/PLATELET
Abs Immature Granulocytes: 0.16 10*3/uL — ABNORMAL HIGH (ref 0.00–0.07)
Basophils Absolute: 0 10*3/uL (ref 0.0–0.1)
Basophils Relative: 0 %
Eosinophils Absolute: 0.3 10*3/uL (ref 0.0–0.5)
Eosinophils Relative: 3 %
HCT: 33.6 % — ABNORMAL LOW (ref 36.0–46.0)
Hemoglobin: 10.5 g/dL — ABNORMAL LOW (ref 12.0–15.0)
Immature Granulocytes: 2 %
Lymphocytes Relative: 17 %
Lymphs Abs: 1.8 10*3/uL (ref 0.7–4.0)
MCH: 27.3 pg (ref 26.0–34.0)
MCHC: 31.3 g/dL (ref 30.0–36.0)
MCV: 87.3 fL (ref 80.0–100.0)
Monocytes Absolute: 0.6 10*3/uL (ref 0.1–1.0)
Monocytes Relative: 5 %
Neutro Abs: 8.2 10*3/uL — ABNORMAL HIGH (ref 1.7–7.7)
Neutrophils Relative %: 73 %
Platelets: 473 10*3/uL — ABNORMAL HIGH (ref 150–400)
RBC: 3.85 MIL/uL — ABNORMAL LOW (ref 3.87–5.11)
RDW: 15.3 % (ref 11.5–15.5)
WBC: 11 10*3/uL — ABNORMAL HIGH (ref 4.0–10.5)
nRBC: 0 % (ref 0.0–0.2)

## 2021-02-04 LAB — GLUCOSE, CAPILLARY
Glucose-Capillary: 118 mg/dL — ABNORMAL HIGH (ref 70–99)
Glucose-Capillary: 77 mg/dL (ref 70–99)
Glucose-Capillary: 156 mg/dL — ABNORMAL HIGH (ref 70–99)

## 2021-02-04 LAB — PHOSPHORUS: Phosphorus: 3.1 mg/dL (ref 2.5–4.6)

## 2021-02-04 LAB — CEA: CEA: 0.8 ng/mL (ref 0.0–4.7)

## 2021-02-04 IMAGING — DX DG CHEST 1V PORT
1 series · 1 of 1 positions shown · non-contrast
Comparison: [DATE]

CLINICAL DATA: Left chest tube removal

EXAM:
PORTABLE CHEST 1 VIEW

[chest ap]
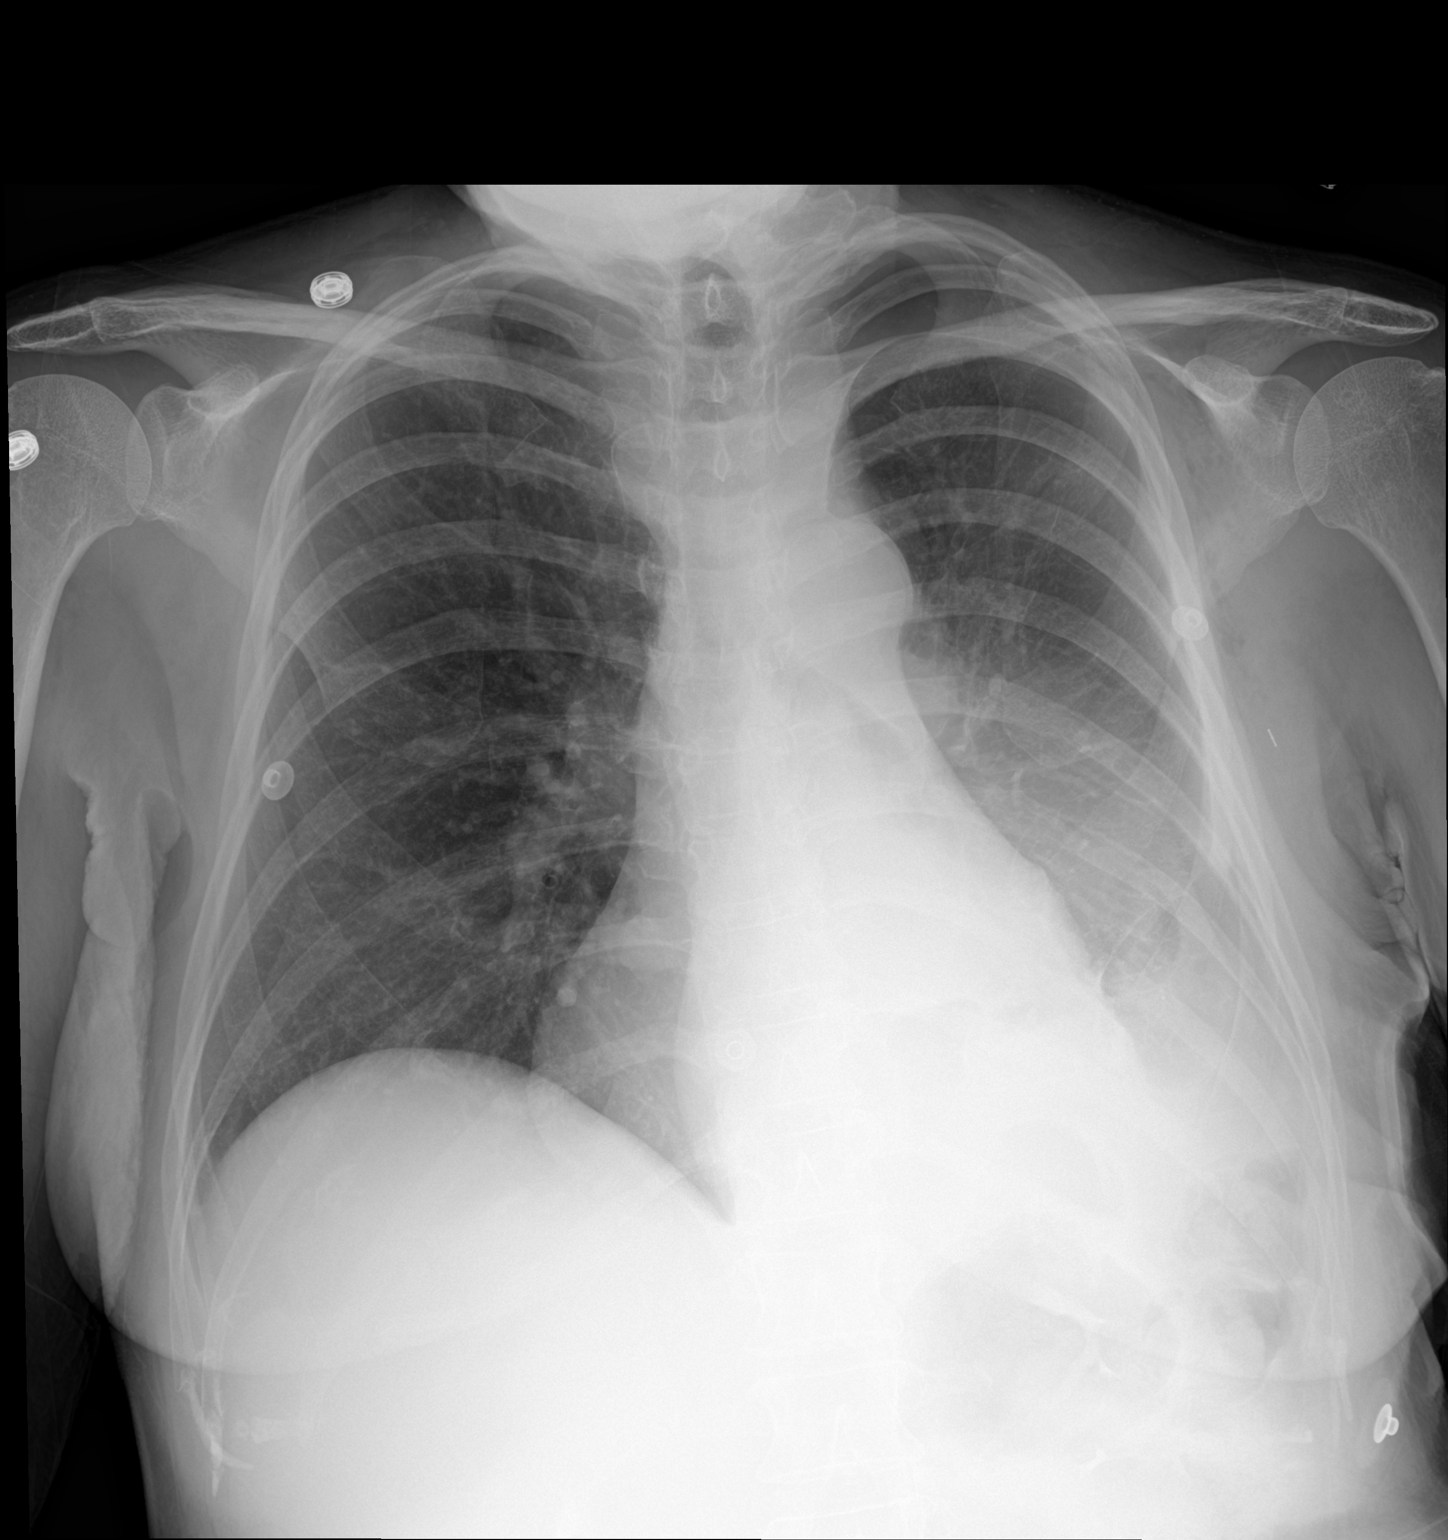

[1 of 1 positions shown; findings below may reference images not displayed]

FINDINGS: Single frontal view of the chest demonstrates interval removal of
the left chest tube. There is a persistent left-sided
hydropneumothorax. Gas component at the left apex is unchanged since
prior study. There has been increase in opacity at the left lung
base compatible with enlarging fluid component and underlying lung
consolidation. Right chest is clear. Cardiac silhouette is
unremarkable. No acute bony abnormalities. Minimal residual gas
within the soft tissues of the left chest wall.
IMPRESSION: 1. Persistent left-sided hydropneumothorax. Stable gas component
since previous examination. Increasing opacity at the left lung base
likely representing progressive left lung consolidation and
enlarging fluid component of the hydropneumothorax.

These results will be called to the ordering clinician or
representative by the Radiologist Assistant, and communication
documented in the PACS or [REDACTED].

## 2021-02-04 MED ORDER — MAGNESIUM SULFATE 2 GM/50ML IV SOLN
2.0000 g | Freq: Once | INTRAVENOUS | Status: AC
  Administered 2021-02-04: 2 g via INTRAVENOUS
  Filled 2021-02-04: qty 50

## 2021-02-04 MED ORDER — DEXAMETHASONE 4 MG PO TABS
6.0000 mg | ORAL_TABLET | Freq: Two times a day (BID) | ORAL | Status: DC
  Administered 2021-02-04 – 2021-02-05 (×3): 6 mg via ORAL
  Filled 2021-02-04 (×3): qty 1

## 2021-02-04 NOTE — Progress Notes (Signed)
Tami Stone is doing okay.  I would not think that she needs the cardiac monitor at this point.  I really hope that this chest tube can come out.  She really would like to go home.  She had the special 3 T MRI done.  It did show a total of 8 lesions.  She still would be a candidate for radiosurgery.  There is no cough or shortness of breath.  Her appetite is doing okay.  She has had no diarrhea.  There has been no bleeding.  Her white cell count is 11 hemoglobin 10.5 platelet count 4-73,000.  Her BUN is 16 creatinine 0.46.  Calcium is 8.8 with an albumin of 2.2.  She has had no headache.  There is no blurred vision.  Her vital signs are temperature of 98.  Pulse 79.  Blood pressure 133/80 oxygen saturation on room air is 98%.  She would like to move around a little bit more.  There really is no change in her overall physical exam.  Hopefully, she will be able to go home before the weekend.  She apparently gets the mask made for her radiation therapy.  This will be next Thursday.  I know the staff on [address] has done a fantastic job with her.  I appreciate all their hard work.  Proverbs 12:25

## 2021-02-04 NOTE — Progress Notes (Signed)
Chest Tube removed per order. No complications. Bedside RN instructed on monitoring patient. CXR order placed. Education provided to patient.

## 2021-02-04 NOTE — Progress Notes (Signed)
PROGRESS NOTE    Tami Stone  NOB:[id number] DOB: 09-Aug-1950 DOA: 01/28/2021 PCP: Gaynelle Cage, MD   Brief Narrative:  Patient is a 70 year old chronically ill-appearing Caucasian female with past medical history significant for but not limited to metastatic adenocarcinoma of the lung, invasive ductal carcinoma of the breast, history of malignant pleural effusion, pericardial effusion as well as other comorbidities who presented with generalized weakness.  She is also noted to have been passed out at home with some tremors.  Initial work-up revealed that she has a large hydropneumothorax and pulmonary consulted and chest tube was placed.  CT of the head was done and showed an ill-defined hypodensity in frontal region.  MRI of the brain showed multiple small metastasis with surrounding edema.  She underwent a 3T MRI yesterday for stereotactic radiosurgery evaluation and she is seen like she is a good candidate.  Currently on dexamethasone and Keppra.  She will be undergoing radiation simulation today and her chest x-ray removed this afternoon and it showed increasing fluid.  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 twice daily; obtained EEG-mild diffuse encephalopathy, nonspecific etiology.  No seizures or epileptiform discharges seen. -Also started on Decadron 8 mg p.o. twice daily,  -Radiation oncology has seen the patient and plan for 3T MRI today for stereotactic radiosurgery; the MRI was read unfortunately only 1 new findings were small subcentimeter lesions of the total number of lesions in the  brain are 8 separate sites -Radiation oncology believes the patient would be a good candidate for stereotactic radiosurgery with single fraction and she is going to be proceeding and be scheduled for 1230 simulation 02/04/2021 and this was done today -She is to have a outpatient appointment with Dr. 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 Silas Flood recommends continuing chest tube till discharge. -As per PCCM, recent admission 8/8-01/2011 for dyspnea and cough, noted to have large left pleural effusion with midline shift to the right, s/p 2.4 L thoracentesis by IR, cytology consistent with malignant cells-metastatic adenocarcinoma. -Patient does have metastatic lung cancer and is back on the Mekinist and Tafinlar -Chest tube has been removed and repeat chest x-ray showed left worsening hydropneumothorax  Invasive ductal carcinoma of the breast -Sees oncology Dr. Marin Olp -Continue with letrozole 2.5 mg p.o. daily   Normocytic Anemia -Hemoglobin/hematocrit is now 10.5/33.6 -Check anemia panel in the a.m -Continue to monitor for signs and symptoms of bleeding; currently no overt bleeding noted -Repeat CBC in a.m.   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 -Continue to Monitor and Trend and repeat CMP in the AM     History of Pericardial Effusion -Seen on echo on 01/18/2021 and noted to be Moderate  without no grossly evidence of Tamponade -Asymptomatic -Continue to Monitor   Hypothyroidism -Continue Armour thyroid 60 mg po Daily    Diabetes  Mellitus Type 2 -CBG has been well controlled; hemoglobin A1c 6.2 -Continue metformin as Patient wants to continue metformin in the hospital -CBG's ranging from 77-1 56  Hypomagnesemia -Patient's magnesium level is 1.6 -Replete with IV mag sulfate 2 g -Continue to monitor and replete as necessary  DVT prophylaxis: SCDs Code Status: Full code Family Communication: No family present at bedside Disposition Plan: Pending further clinical work-up and clearance by specialists including radiation oncology as well as medical oncology  Status is: Inpatient  Remains inpatient appropriate because:Unsafe d/c plan, IV treatments appropriate due to intensity of illness or inability to take PO, and Inpatient level of care appropriate due to severity of illness  Dispo: The patient is from: Home              Anticipated d/c is to:  TBD              Patient currently is not medically stable to be discharged home   Difficult to place patient No  Consultants:  Palliative care medicine Pulmonary Radiation oncology Medical oncology  Procedures: Had her chest tube placed and now removed 02/04/2021  Antimicrobials:  Anti-infectives (From admission, onward)    None        Subjective: Seen and examined at bedside and still has a chest tube this afternoon but will go under radiation simulation.  Pulmonary removed her chest tube and she had worsening hydropneumothorax on chest x-ray but no worsening of symptoms and wants to hold off on repeat thoracentesis and Pleurx catheter placement.  No nausea or vomiting.  Does not want to take the [place] so we will need to discuss with neurooncology in the morning.  Likely can be discharged in next 24 to 48 hours.  Objective: Vitals:   02/03/21 2203 02/04/21 0700 02/04/21 0706 02/04/21 1205  BP: 131/87  133/80 114/78  Pulse: 84  79 81  Resp: 20  18 20   Temp: 98.1 F (36.7 C)  98 F (36.7 C) 98.5 F (36.9 C)  TempSrc: Oral  Oral   SpO2: 100%  98% 99%   Weight:  54.2 kg    Height:        Intake/Output Summary (Last 24 hours) at 02/04/2021 1854 Last data filed at 02/04/2021 1849 Gross per 24 hour  Intake 2559.9 ml  Output 160 ml  Net 2399.9 ml    Filed Weights   02/01/21 0500 02/03/21 0600 02/04/21 0700  Weight: 53.3 kg 53.9 kg 54.2 kg   Examination: Physical Exam:  Constitutional: WN/WD chronically ill-appearing Caucasian female currently no acute distress Eyes: Lids and conjunctivae normal, sclerae anicteric  ENMT: External Ears, Nose appear normal. Grossly normal hearing.  Neck: Appears normal, supple, no cervical masses, normal ROM, no appreciable thyromegaly; no JVD Respiratory: Diminished to auscultation bilaterally with coarse breath sounds, no wheezing, rales, rhonchi or crackles. Normal respiratory effort and patient is not tachypenic. No accessory muscle use.  Unlabored breathing but has a left-sided chest tube. Cardiovascular: RRR, no murmurs / rubs / gallops. S1 and S2 auscultated. No extremity edema.  Abdomen: Soft, non-tender, non-distended. Bowel sounds positive.  GU: Deferred. Musculoskeletal: No clubbing / cyanosis of digits/nails. No joint deformity upper and lower extremities. Skin: No rashes, lesions, ulcers on limited skin evaluation. No induration; Warm and dry.  Neurologic: CN 2-12 grossly intact with no focal deficits. Romberg sign and  cerebellar reflexes not assessed.  Psychiatric: Normal judgment and insight. Alert and oriented x 3. Normal mood and appropriate affect.   Data Reviewed: I have personally reviewed following labs and imaging studies  CBC: Recent Labs  Lab 01/29/21 0737 01/30/21 0210 02/01/21 0437 02/03/21 0509 02/04/21 0429  WBC 6.1 5.5 7.3 9.8 11.0*  NEUTROABS  --   --   --  7.1 8.2*  HGB 10.1* 11.2* 10.5* 10.7* 10.5*  HCT 31.9* 35.3* 34.0* 34.8* 33.6*  MCV 86.2 86.3 88.5 88.5 87.3  PLT 305 323 381 384 473*    Basic Metabolic Panel: Recent Labs  Lab 01/29/21 0737  01/30/21 0210 02/01/21 0437 02/03/21 0509 02/04/21 0429  NA 132* 134* 138 137 139  K 3.7 4.2 4.5 4.3 4.3  CL 100 101 104 102 104  CO2 26 24 26 27 28   GLUCOSE 108* 128* 129* 117* 136*  BUN 14 10 24* 18 16  CREATININE 0.50 0.45 0.55 0.51 0.46  CALCIUM 7.8* 8.3* 8.5* 8.7* 8.8*  MG  --   --   --   --  1.6*  PHOS  --   --   --   --  3.1    GFR: Estimated Creatinine Clearance: 51 mL/min (by C-G formula based on SCr of 0.46 mg/dL). Liver Function Tests: Recent Labs  Lab 01/29/21 0737 02/03/21 0509 02/04/21 0429  AST 15 16 17   ALT 10 12 12   ALKPHOS 72 78 80  BILITOT 0.2* 0.5 0.1*  PROT 5.4* 5.5* 5.3*  ALBUMIN 2.3* 2.3* 2.2*    No results for input(s): LIPASE, AMYLASE in the last 168 hours. No results for input(s): AMMONIA in the last 168 hours. Coagulation Profile: No results for input(s): INR, PROTIME in the last 168 hours. Cardiac Enzymes: No results for input(s): CKTOTAL, CKMB, CKMBINDEX, TROPONINI in the last 168 hours. BNP (last 3 results) No results for input(s): PROBNP in the last 8760 hours. HbA1C: No results for input(s): HGBA1C in the last 72 hours. CBG: Recent Labs  Lab 02/03/21 1201 02/03/21 1711 02/04/21 0805 02/04/21 1202 02/04/21 1628  GLUCAP 153* 106* 118* 77 156*    Lipid Profile: No results for input(s): CHOL, HDL, LDLCALC, TRIG, CHOLHDL, LDLDIRECT in the last 72 hours. Thyroid Function Tests: No results for input(s): TSH, T4TOTAL, FREET4, T3FREE, THYROIDAB in the last 72 hours. Anemia Panel: No results for input(s): VITAMINB12, FOLATE, FERRITIN, TIBC, IRON, RETICCTPCT in the last 72 hours. Sepsis Labs: No results for input(s): PROCALCITON, LATICACIDVEN in the last 168 hours.  Recent Results (from the past 240 hour(s))  Resp Panel by RT-PCR (Flu A&B, Covid) Nasopharyngeal Swab     Status: None   Collection Time: 01/28/21  1:01 PM   Specimen: Nasopharyngeal Swab; Nasopharyngeal(NP) swabs in vial transport medium  Result Value Ref Range  Status   SARS Coronavirus 2 by RT PCR NEGATIVE NEGATIVE Final    Comment: (NOTE) SARS-CoV-2 target nucleic acids are NOT DETECTED.  The SARS-CoV-2 RNA is generally detectable in upper respiratory specimens during the acute phase of infection. The lowest concentration of SARS-CoV-2 viral copies this assay can detect is 138 copies/mL. A negative result does not preclude SARS-Cov-2 infection and should not be used as the sole basis for treatment or other patient management decisions. A negative result may occur with  improper specimen collection/handling, submission of specimen other than nasopharyngeal swab, presence of viral mutation(s) within the areas targeted by this assay, and inadequate number of viral copies(<138 copies/mL). A negative result must be combined  with clinical observations, patient history, and epidemiological information. The expected result is Negative.  Fact Sheet for Patients:  EntrepreneurPulse.com.au  Fact Sheet for Healthcare Providers:  IncredibleEmployment.be  This test is no t yet approved or cleared by the [place] FDA and  has been authorized for detection and/or diagnosis of SARS-CoV-2 by FDA under an Emergency Use Authorization (EUA). This EUA will remain  in effect (meaning this test can be used) for the duration of the COVID-19 declaration under Section 564(b)(1) of the Act, 21 U.S.C.section 360bbb-3(b)(1), unless the authorization is terminated  or revoked sooner.       Influenza A by PCR NEGATIVE NEGATIVE Final   Influenza B by PCR NEGATIVE NEGATIVE Final    Comment: (NOTE) The Xpert Xpress SARS-CoV-2/FLU/RSV plus assay is intended as an aid in the diagnosis of influenza from Nasopharyngeal swab specimens and should not be used as a sole basis for treatment. Nasal washings and aspirates are unacceptable for Xpert Xpress SARS-CoV-2/FLU/RSV testing.  Fact Sheet for  Patients: EntrepreneurPulse.com.au  Fact Sheet for Healthcare Providers: IncredibleEmployment.be  This test is not yet approved or cleared by the [place] FDA and has been authorized for detection and/or diagnosis of SARS-CoV-2 by FDA under an Emergency Use Authorization (EUA). This EUA will remain in effect (meaning this test can be used) for the duration of the COVID-19 declaration under Section 564(b)(1) of the Act, 21 U.S.C. section 360bbb-3(b)(1), unless the authorization is terminated or revoked.  Performed at [facility], [place] [address] [place], [place] [address]   MRSA Next Gen by PCR, Nasal     Status: None   Collection Time: 01/29/21  6:53 PM   Specimen: Nasal Mucosa; Nasal Swab  Result Value Ref Range Status   MRSA by PCR Next Gen NOT DETECTED NOT DETECTED Final    Comment: (NOTE) The GeneXpert MRSA Assay (FDA approved for NASAL specimens only), is one component of a comprehensive MRSA colonization surveillance program. It is not intended to diagnose MRSA infection nor to guide or monitor treatment for MRSA infections. Test performance is not FDA approved in patients less than 64 years old. Performed at [facility], [place] [address], [place], [place] [address]       RN Pressure Injury Documentation:     Estimated body mass index is 21.84 kg/m as calculated from the following:   Height as of this encounter: 5\' 2"  (1.575 m).   Weight as of this encounter: 54.2 kg.  Malnutrition Type:  Nutrition Problem: Inadequate oral intake Etiology: poor appetite  Malnutrition Characteristics:  Signs/Symptoms: per patient/family report  Nutrition Interventions:  Interventions: Ensure Enlive (each supplement provides 350kcal and 20 grams of protein), MVI   Radiology Studies: DG CHEST PORT 1 VIEW  Result Date: 02/04/2021 CLINICAL DATA:  Left chest tube removal EXAM: PORTABLE CHEST 1 VIEW  COMPARISON:  02/01/2021 FINDINGS: Single frontal view of the chest demonstrates interval removal of the left chest tube. There is a persistent left-sided hydropneumothorax. Gas component at the left apex is unchanged since prior study. There has been increase in opacity at the left lung base compatible with enlarging fluid component and underlying lung consolidation. Right chest is clear. Cardiac silhouette is unremarkable. No acute bony abnormalities. Minimal residual gas within the soft tissues of the left chest wall. IMPRESSION: 1. Persistent left-sided hydropneumothorax. Stable gas component since previous examination. Increasing opacity at the left lung base likely representing progressive left lung consolidation and enlarging fluid component of the hydropneumothorax. These  results will be called to the ordering clinician or representative by the Radiologist Assistant, and communication documented in the PACS or [employer]. Electronically Signed   By: Randa Ngo M.D.   On: 02/04/2021 16:22    Scheduled Meds:  Chlorhexidine Gluconate Cloth  6 each Topical Daily   dabrafenib mesylate  100 mg Oral BID   dexamethasone  6 mg Oral Q12H   dronabinol  5 mg Oral BID AC   feeding supplement  237 mL Oral TID BM   letrozole  2.5 mg Oral Daily   levETIRAcetam  500 mg Oral BID   metFORMIN  750 mg Oral BID WC   senna  1 tablet Oral Daily   sodium chloride flush  10 mL Intracatheter Q8H   sodium chloride flush  3 mL Intravenous Q12H   thyroid  60 mg Oral Daily   trametinib dimethyl sulfoxide  2 mg Oral Daily   Continuous Infusions:  sodium chloride 10 mL/hr at 01/29/21 0905   sodium chloride 75 mL/hr at 02/04/21 0428    LOS: 6 days   Kerney Elbe, DO [facility] PAGER is on AMION  If 7PM-7AM, please contact night-coverage [email or web address]

## 2021-02-04 NOTE — Progress Notes (Signed)
Red Med List:   Patient refused scheduled Keppra stated she didn't like the way it makes her feel, Dr. Alfredia Ferguson notified and stated he will confirm with neurologist.    Anti-Seizures

## 2021-02-04 NOTE — Progress Notes (Addendum)
Reviewed cxr post chest tube removal. There is fluid and increased lready.    MEt with patient - she feels stable.She does not want pleurx or repeat thora now. She will monitor. She undersands if fluids increase she will get mor dyspneic and report to ER. She is worried about infection riskw with pleurx but after counseling has accepted the low risk  For now she will monitor clinically and follow with pulmonary Eric Form on 8/29 and Dr Silas Flood 02/22/21  Future Appointments  Date Time Provider [place]  02/08/2021  7:00 AM [place] [facility] None  02/08/2021 10:00 AM Magdalen Spatz, NP [facility] None  02/11/2021  3:30 PM Kyung Rudd, MD [facility] None  02/22/2021  2:15 PM Hunsucker, Bonna Gains, MD [facility] None  03/15/2021 12:00 PM [facility] None      Ccm signing off  DG Chest 1 View  Result Date: 02/01/2021 CLINICAL DATA:  Left chest tube. EXAM: CHEST  1 VIEW COMPARISON:  01/31/2021 and CT chest 01/19/2021. FINDINGS: Trachea is midline. Heart size stable. Persistent left lower lobe collapse/consolidation. Small to moderate left hydropneumothorax with a small bore drain in place, as before. Associated subcutaneous emphysema in the left axillary region. Right lung is clear. IMPRESSION: 1. Small to moderate left hydropneumothorax with drain in place, unchanged. 2. Persistent left lower lobe collapse/consolidation, possibly representing pneumonia. Electronically Signed   By: Lorin Picket M.D.   On: 02/01/2021 11:23   MR BRAIN W WO CONTRAST  Result Date: 02/03/2021 CLINICAL DATA:  Brain/CNS neoplasm, staging. Metastatic lung cancer. EXAM: MRI HEAD WITHOUT AND WITH CONTRAST TECHNIQUE: Multiplanar, multiecho pulse sequences of the brain and surrounding structures were obtained without and with intravenous contrast. CONTRAST:  5.65m GADAVIST GADOBUTROL 1 MMOL/ML IV SOLN COMPARISON:  01/28/2021 FINDINGS: Brain: There are a total of  8 subcentimeter enhancing brain lesions, some of which are more conspicuous and/or slightly larger on today's 3 T MRI compared to the prior 1.5 T study. The lesions are annotated on series 1100 and are located as follows: left cerebellum (image 136), right cerebellum (image 146), right cerebellum (image 157), left frontal lobe (image 190), right frontal lobe (image 218), left parietal lobe (image 220), left frontal lobe (image 227), and right frontal lobe (image 231). Mild edema associated with several lesions is unchanged, greatest about the 5 mm left frontal lesion (image 190). A few foci of vascular enhancement are noted including in the right (image 211) and left parietal regions (image 206) and medial right cerebellum (image 138). No acute infarct, intracranial hemorrhage, midline shift, or extra-axial fluid collection is evident. Scattered small T2 hyperintensities in the cerebral white matter and pons are nonspecific but compatible with mild chronic small vessel ischemic disease. There is mild cerebral atrophy. A tiny chronic right cerebellar infarct is noted. Vascular: Major intracranial vascular flow voids are preserved. Skull and upper cervical spine: Unremarkable bone marrow signal. Sinuses/Orbits: Unremarkable orbits. Minimal mucosal thickening in the ethmoid sinuses. Clear mastoid air cells. Other: None. IMPRESSION: 1. 8 subcentimeter brain metastases with up to mild edema. 2. Mild chronic small vessel ischemic disease. Electronically Signed   By: ALogan BoresM.D.   On: 02/03/2021 10:33   DG CHEST PORT 1 VIEW  Result Date: 02/04/2021 CLINICAL DATA:  Left chest tube removal EXAM: PORTABLE CHEST 1 VIEW COMPARISON:  02/01/2021 FINDINGS: Single frontal view of the chest demonstrates interval removal of the left chest tube. There is a persistent left-sided hydropneumothorax. Gas component at the left  apex is unchanged since prior study. There has been increase in opacity at the left lung base compatible  with enlarging fluid component and underlying lung consolidation. Right chest is clear. Cardiac silhouette is unremarkable. No acute bony abnormalities. Minimal residual gas within the soft tissues of the left chest wall. IMPRESSION: 1. Persistent left-sided hydropneumothorax. Stable gas component since previous examination. Increasing opacity at the left lung base likely representing progressive left lung consolidation and enlarging fluid component of the hydropneumothorax. These results will be called to the ordering clinician or representative by the Radiologist Assistant, and communication documented in the PACS or [employer]. Electronically Signed   By: Randa Ngo M.D.   On: 02/04/2021 16:22      SIGNATURE    Dr. Brand Males, M.D., F.C.C.P,  Pulmonary and Critical Care Medicine Staff Physician, [place] Director - Interstitial Lung Disease  Program  Pulmonary [place] [facility] [place], [place], [address]  NPI Number:  NPI #[id number]  Pager: [phone], If no answer  -> Check AMION or Try [phone] Telephone (clinical office): [phone] Telephone (research): [phone]  3:57 PM 02/04/2021

## 2021-02-04 NOTE — Progress Notes (Signed)
NAMETyreona Stone, MRN:  [id number], DOB:  12-Sep-1950, LOS: 6 ADMISSION DATE:  01/28/2021, CONSULTATION DATE:  8/18 REFERRING MD:  Dr. Alvino Stone, CHIEF COMPLAINT:  SOB   BRIEF  70 y/o F who presented to [facility] ER on 8/18 with reports of syncope.   The patient was recently admitted from 8/8-8/12 for at least a one month duration of shortness of breath with dry cough.  She was found to have a large left pleural effusion with mediastinal shift to the right.  She was admitted and underwent a thoracentesis completed by IR with 2.4L fluid removed.  Cytology consistent with malignant cells consistent with metastatic adenocarcinoma.   She is followed by Dr. Arnoldo Stone at [facility] from Oncology & Dr. Marin Stone as second opinion. She also follows with an integrative medicine specialist.  She has been using liquid Ivermectin for her cancer.    The patient reports she returned to the ER for what she describes as weakness & nausea.  She reports she has had intermittent fevers at home.  She notes weight loss of 164 lbs down to 119 since her diagnosis in November 2021.  Her daughter has been in visiting from [place] to help care for her and reported the patient became lightheaded and then was altered for approximately 45 seconds with tremors.  The patient has no memory of the events. EMS was activated.  On arrival she had soft normal blood pressures.  She was given IVF.  CT head was evaluated and showed a new ill defined enhancement in the left frontal lobe concerning for possible mass.  CXR showed a large left-sided hydropneumothorax.    PCCM consulted for evaluation of hydropneumothorax.   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 90cc chest tube output. CXR stable.  Interim History / Subjective:   8/25 - per RN chest tube not draining muc subjectively. Objectively per RN - 100cc amber clear x 24h. Patient wants tube out ideally. Feels ok. No complaints. No questions  Objective   Blood pressure 133/80, pulse 79, temperature 98 F (36.7 C), temperature source Oral, resp. rate 18, height 5' 2"  (1.575 m), weight 54.2 kg, SpO2 98 %.        Intake/Output Summary (Last 24 hours) at 02/04/2021 0959 Last data filed at 02/04/2021 0918 Gross per 24 hour  Intake 2390.75 ml  Output 50 ml  Net 2340.75 ml   Filed Weights   02/01/21 0500 02/03/21 0600 02/04/21 0700  Weight: 53.3 kg 53.9 kg 54.2 kg    Examination: General Appearance:  Looks think. stable Head:  Normocephalic, without obvious abnormality, atraumatic Eyes:  PERRL - yes, conjunctiva/corneas - muddy     Ears:  Normal external ear canals, both ears Nose:  G tube - no Throat:  ETT TUBE - no , OG tube - no Neck:  Supple,  No enlargement/tenderness/nodules Lungs: Clear to auscultation  bilaterally, LEft chest tube + Heart:  S1 and S2 normal, no murmur, CVP - no.  Pressors - no0 Abdomen:  Soft, no masses, no organomegaly Genitalia / Rectal:  Not done Extremities:  Extremities- intact Skin:  ntact in exposed areas . Sacral area - x Neurologic:  Sedation - none -> RASS - +1 . Moves all 4s - yes. CAM-ICU - neg . Orientation - x3+     Resolved Hospital Problem list     Assessment & Plan:   Left Hydropneumothorax: Presumed secondary spontaneous PTX due to cancer. May  have been slow leak post thora 8/11, PTX ex vacuo. Largely asymptomatic prior to presentation from a pulmonary standpoint.  Lung appears trapped. Unlikely to expand. No air leak at time of evaluation.  02/04/2021 - drains < 100cc/24h of amber fluid. CXR few days ago look ok  Plan - dc chest tube (prior has been  discussed role of heimlich valve, continuing at home and she does not want to do this after discussing risks and benefits) - clinical monitoring  - if recurs consider pleurx (prior doscussion - she will contemplate and continue discussed with Dr. Marin Stone but is leaning towards not wanting to have a pleurx placed._ - get cxr post tube removal   Aim for dc today 02/04/2021 or 02/05/21    SIGNATURE    Dr. Brand Stone, M.D., F.C.C.P,  Pulmonary and Critical Care Medicine Staff Physician, [place] Director - Interstitial Lung Disease  Program  P[facility] [place] [facility] [place], [place], [address]  NPI Number:  NPI #[id number]  Pager: [phone], If no answer  -> Check AMION or Try [facility] Telephone (clinical office): [phone] Telephone (research): [phone]  10:13 AM 02/04/2021

## 2021-02-04 NOTE — Progress Notes (Signed)
IV fluids not infusing d/t pt's IV occluded and pt refused new IV. MD made aware.

## 2021-02-04 NOTE — Plan of Care (Signed)
  Problem: Education: Goal: Knowledge of General Education information will improve Description: Including pain rating scale, medication(s)/side effects and non-pharmacologic comfort measures Outcome: Progressing   Problem: Health Behavior/Discharge Planning: Goal: Ability to manage health-related needs will improve Outcome: Progressing   Problem: Clinical Measurements: Goal: Ability to maintain clinical measurements within normal limits will improve Outcome: Progressing Goal: Respiratory complications will improve Outcome: Progressing Goal: Cardiovascular complication will be avoided Outcome: Progressing   Problem: Activity: Goal: Risk for activity intolerance will decrease Outcome: Adequate for Discharge   Problem: Nutrition: Goal: Adequate nutrition will be maintained Outcome: Adequate for Discharge

## 2021-02-05 ENCOUNTER — Inpatient Hospital Stay (HOSPITAL_COMMUNITY): Payer: Medicare Other

## 2021-02-05 DIAGNOSIS — R55 Syncope and collapse: Secondary | ICD-10-CM | POA: Diagnosis not present

## 2021-02-05 DIAGNOSIS — C50512 Malignant neoplasm of lower-outer quadrant of left female breast: Secondary | ICD-10-CM

## 2021-02-05 DIAGNOSIS — Z9689 Presence of other specified functional implants: Secondary | ICD-10-CM | POA: Diagnosis not present

## 2021-02-05 DIAGNOSIS — Z170 Estrogen receptor positive status [ER+]: Secondary | ICD-10-CM | POA: Diagnosis not present

## 2021-02-05 DIAGNOSIS — R569 Unspecified convulsions: Secondary | ICD-10-CM | POA: Diagnosis not present

## 2021-02-05 DIAGNOSIS — J948 Other specified pleural conditions: Secondary | ICD-10-CM | POA: Diagnosis not present

## 2021-02-05 LAB — COMPREHENSIVE METABOLIC PANEL
ALT: 11 U/L (ref 0–44)
AST: 14 U/L — ABNORMAL LOW (ref 15–41)
Albumin: 2.2 g/dL — ABNORMAL LOW (ref 3.5–5.0)
Alkaline Phosphatase: 79 U/L (ref 38–126)
Anion gap: 7 (ref 5–15)
BUN: 17 mg/dL (ref 8–23)
CO2: 29 mmol/L (ref 22–32)
Calcium: 8.8 mg/dL — ABNORMAL LOW (ref 8.9–10.3)
Chloride: 100 mmol/L (ref 98–111)
Creatinine, Ser: 0.57 mg/dL (ref 0.44–1.00)
GFR, Estimated: >60 mL/min (ref >60)
Glucose, Bld: 123 mg/dL — ABNORMAL HIGH (ref 70–99)
Potassium: 4.4 mmol/L (ref 3.5–5.1)
Sodium: 136 mmol/L (ref 135–145)
Total Bilirubin: 0.3 mg/dL (ref 0.3–1.2)
Total Protein: 5.4 g/dL — ABNORMAL LOW (ref 6.5–8.1)

## 2021-02-05 LAB — GLUCOSE, CAPILLARY
Glucose-Capillary: 127 mg/dL — ABNORMAL HIGH (ref 70–99)
Glucose-Capillary: 111 mg/dL — ABNORMAL HIGH (ref 70–99)
Glucose-Capillary: 124 mg/dL — ABNORMAL HIGH (ref 70–99)

## 2021-02-05 LAB — CBC WITH DIFFERENTIAL/PLATELET
Abs Immature Granulocytes: 0.08 10*3/uL — ABNORMAL HIGH (ref 0.00–0.07)
Basophils Absolute: 0 10*3/uL (ref 0.0–0.1)
Basophils Relative: 0 %
Eosinophils Absolute: 0.2 10*3/uL (ref 0.0–0.5)
Eosinophils Relative: 2 %
HCT: 32.7 % — ABNORMAL LOW (ref 36.0–46.0)
Hemoglobin: 10.3 g/dL — ABNORMAL LOW (ref 12.0–15.0)
Immature Granulocytes: 1 %
Lymphocytes Relative: 18 %
Lymphs Abs: 1.8 10*3/uL (ref 0.7–4.0)
MCH: 27.5 pg (ref 26.0–34.0)
MCHC: 31.5 g/dL (ref 30.0–36.0)
MCV: 87.4 fL (ref 80.0–100.0)
Monocytes Absolute: 0.6 10*3/uL (ref 0.1–1.0)
Monocytes Relative: 6 %
Neutro Abs: 7.2 10*3/uL (ref 1.7–7.7)
Neutrophils Relative %: 73 %
Platelets: 461 10*3/uL — ABNORMAL HIGH (ref 150–400)
RBC: 3.74 MIL/uL — ABNORMAL LOW (ref 3.87–5.11)
RDW: 15.3 % (ref 11.5–15.5)
WBC: 9.9 10*3/uL (ref 4.0–10.5)
nRBC: 0 % (ref 0.0–0.2)

## 2021-02-05 LAB — MAGNESIUM: Magnesium: 1.9 mg/dL (ref 1.7–2.4)

## 2021-02-05 LAB — PHOSPHORUS: Phosphorus: 3.4 mg/dL (ref 2.5–4.6)

## 2021-02-05 IMAGING — DX DG CHEST 1V PORT
1 series · 1 of 1 positions shown · non-contrast
Comparison: Portable chest [DATE] and earlier.

CLINICAL DATA: 70-year-old female with shortness of breath.
Metastatic lung cancer, recurrent pleural effusion.

EXAM:
PORTABLE CHEST 1 VIEW

[chest ap]
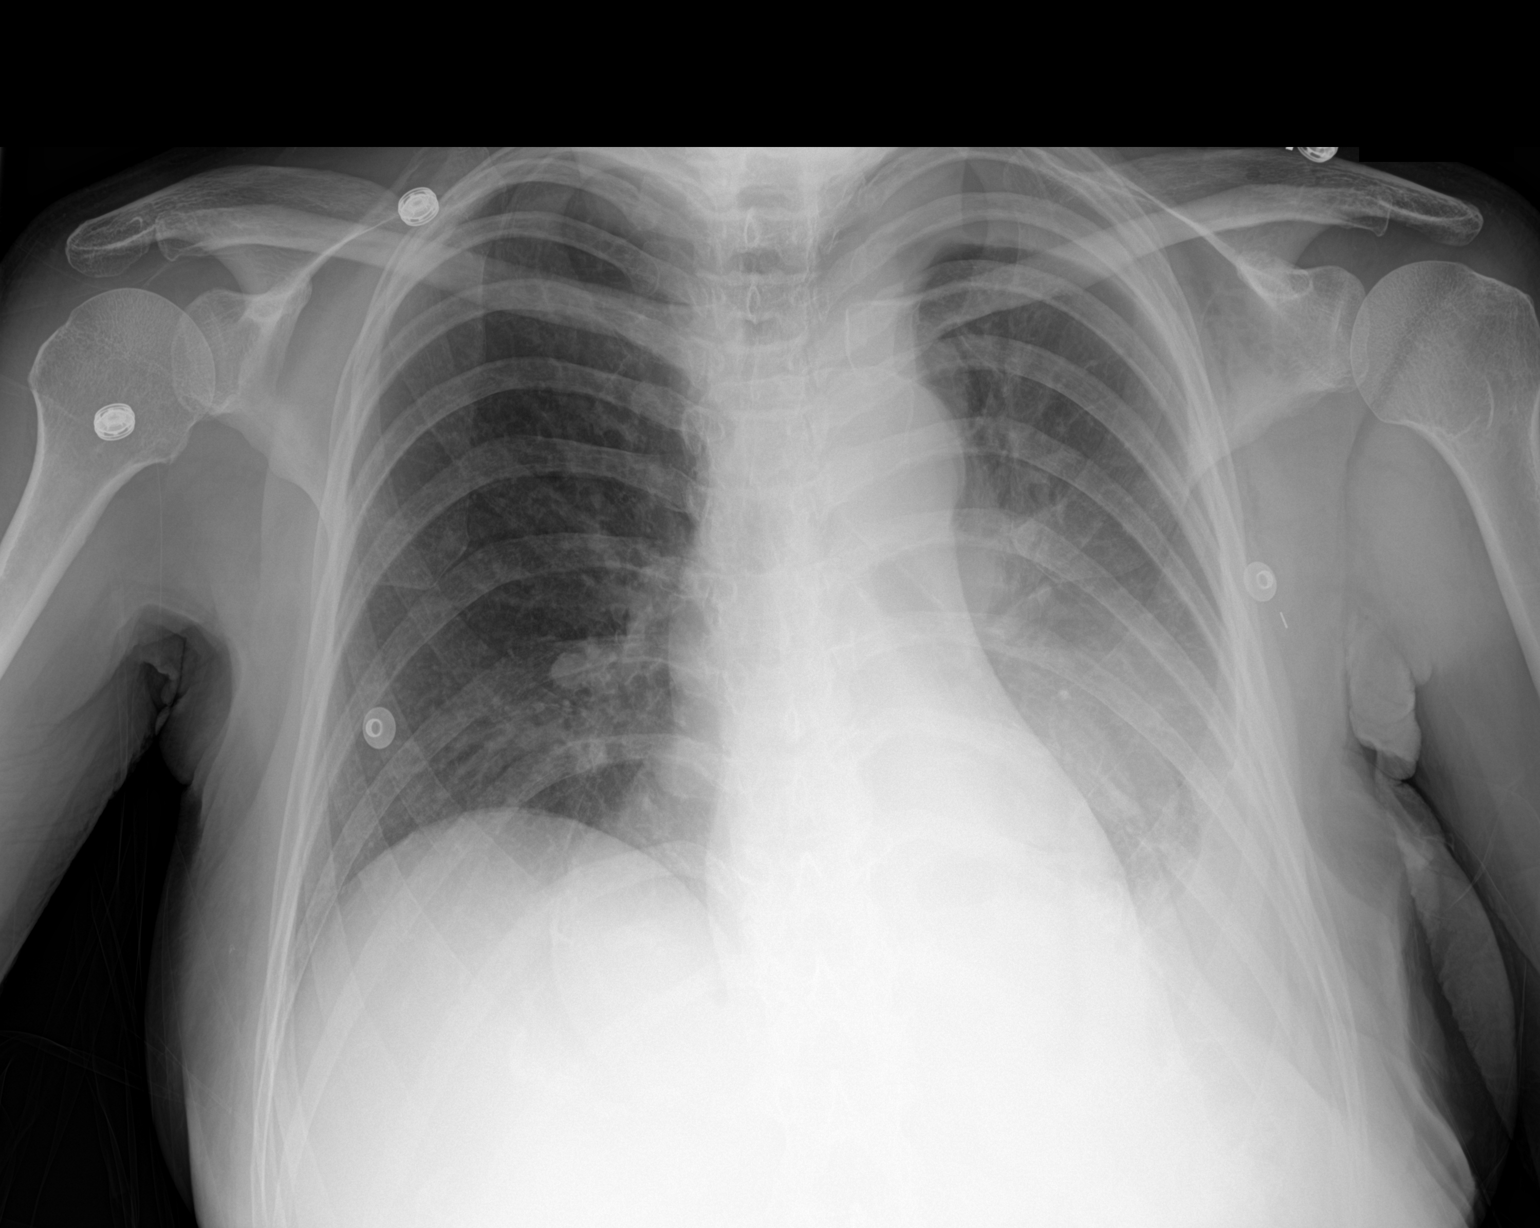

[1 of 1 positions shown; findings below may reference images not displayed]

FINDINGS: Portable AP semi upright view at [7D] hours. Stable mediastinal
contour. Lower lung volumes. Veiling opacity in the left medial and
lower lung compatible with residual pleural effusion. And there is
evidence that pleural fluid now occupies the apical pleural space
where air was seen previously. A small volume of left axillary
subcutaneous gas is stable. Left pleural drain was in place earlier
this month. No superimposed pneumothorax, pulmonary edema, or
consolidation. Mild crowding of markings in the right lung. No acute
osseous abnormality identified.
IMPRESSION: Lower lung volumes. Left pleural effusion persists but no pleural
air seen now. No new cardiopulmonary abnormality.

## 2021-02-05 MED ORDER — METHOCARBAMOL 500 MG PO TABS: 500.0000 mg | ORAL_TABLET | Freq: Four times a day (QID) | ORAL | 0 refills | Status: DC | PRN

## 2021-02-05 MED ORDER — SENNA 8.6 MG PO TABS: 1.0000 | ORAL_TABLET | Freq: Every day | ORAL | 0 refills | Status: DC

## 2021-02-05 MED ORDER — LACOSAMIDE 50 MG PO TABS: 50.0000 mg | ORAL_TABLET | Freq: Two times a day (BID) | ORAL | 0 refills | Status: DC

## 2021-02-05 MED ORDER — LACOSAMIDE 50 MG PO TABS
50.0000 mg | ORAL_TABLET | Freq: Two times a day (BID) | ORAL | Status: DC
  Administered 2021-02-05: 50 mg via ORAL
  Filled 2021-02-05: qty 1

## 2021-02-05 MED ORDER — DIPHENHYDRAMINE-ZINC ACETATE 2-0.1 % EX CREA: TOPICAL_CREAM | CUTANEOUS | 0 refills | Status: DC | PRN

## 2021-02-05 MED ORDER — ACETAMINOPHEN 325 MG PO TABS: 650.0000 mg | ORAL_TABLET | Freq: Four times a day (QID) | ORAL | 0 refills | Status: DC | PRN

## 2021-02-05 MED ORDER — DEXAMETHASONE 4 MG PO TABS: 4.0000 mg | ORAL_TABLET | Freq: Two times a day (BID) | ORAL | 0 refills | Status: DC

## 2021-02-05 MED ORDER — ONDANSETRON 4 MG PO TBDP: 4.0000 mg | ORAL_TABLET | Freq: Three times a day (TID) | ORAL | 0 refills | Status: DC | PRN

## 2021-02-05 MED ORDER — ENSURE ENLIVE PO LIQD: 237.0000 mL | Freq: Three times a day (TID) | ORAL | 12 refills | Status: DC

## 2021-02-05 NOTE — Progress Notes (Signed)
Patient discharged, discharge instructions given and explained to patient she verbalized understanding, denies any pain/distress. patient waiting for her husband to pick her up.

## 2021-02-05 NOTE — Plan of Care (Signed)
  Problem: Education: Goal: Knowledge of General Education information will improve Description: Including pain rating scale, medication(s)/side effects and non-pharmacologic comfort measures Outcome: Progressing   Problem: Health Behavior/Discharge Planning: Goal: Ability to manage health-related needs will improve Outcome: Progressing   Problem: Clinical Measurements: Goal: Diagnostic test results will improve Outcome: Progressing   Problem: Activity: Goal: Risk for activity intolerance will decrease Outcome: Adequate for Discharge   Problem: Nutrition: Goal: Adequate nutrition will be maintained Outcome: Adequate for Discharge   Problem: Elimination: Goal: Will not experience complications related to bowel motility Outcome: Adequate for Discharge Goal: Will not experience complications related to urinary retention Outcome: Adequate for Discharge

## 2021-02-05 NOTE — Discharge Summary (Signed)
Physician Discharge Summary  Briyanna Billingham NLZ:[id number] DOB: 06/19/1950 DOA: 01/28/2021  PCP: Gaynelle Cage, MD  Admit date: 01/28/2021 Discharge date: 02/05/2021  Admitted From: Home Disposition: Home  Recommendations for Outpatient Follow-up:  Follow up with PCP in 1-2 weeks Follow up with Pulmonary within 1-2 weeks Follow up with Medical Oncology within 1 week Follow up with Radiation Oncology within 1 week Follow up with Neurosurgery within 1 week Follow up with Neuro-Oncology within 1 week Please obtain CMP/CBC, Mag, Phos in one week Please follow up on the following pending results:  Home Health: No Equipment/Devices: None  Discharge Condition: Stable CODE STATUS: FULL CODE Diet recommendation: Heart Healthy   Brief/Interim Summary: Patient is a 70 year old chronically ill-appearing Caucasian female with past medical history significant for but not limited to metastatic adenocarcinoma of the lung, invasive ductal carcinoma of the breast, history of malignant pleural effusion, pericardial effusion as well as other comorbidities who presented with generalized weakness.  She is also noted to have been passed out at home with some tremors.  Initial work-up revealed that she has a large hydropneumothorax and pulmonary consulted and chest tube was placed.  CT of the head was done and showed an ill-defined hypodensity in frontal region.  MRI of the brain showed multiple small metastasis with surrounding edema.  She underwent a 3T MRI yesterday for stereotactic radiosurgery evaluation and she is seen like she is a good candidate.  Currently on dexamethasone and Keppra.  She will be undergoing radiation simulation today and her chest x-ray removed this afternoon and it showed increasing fluid.  Patient does not want Pleurx or repeat thoracentesis at this time and if she becomes more dyspneic she will then consider at that time however her repeat chest x-ray was stable.  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 twice daily but because of side effects patient did not want Keppra so she was switched to Vimpat 50 mg p.o. twice daily; obtained EEG-mild diffuse encephalopathy, nonspecific etiology.  No seizures or epileptiform discharges seen. -Also started on Decadron 8 mg p.o. twice daily and this was reduced down to 6 mg p.o. twice daily and then will be discharged on 4 mg p.o. twice daily at the recommendations of neurooncology Dr. Mickeal Skinner -Radiation oncology has seen the patient and plan for 3T MRI today for stereotactic radiosurgery; the MRI was read unfortunately only 1 new findings were small subcentimeter lesions of the total number of lesions in the brain are 8 separate sites -Radiation oncology believes the patient would be a good candidate for stereotactic radiosurgery with single fraction and she is going to be proceeding and be scheduled for 1230 simulation 02/04/2021 and this was done yesterday -She is to have a outpatient appointment with Dr. 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 Silas Flood recommends continuing chest tube till discharge. -As per PCCM, recent admission 8/8-01/2011 for dyspnea and cough, noted to have large left pleural effusion with midline shift to the right, s/p 2.4 L thoracentesis by IR, cytology consistent with malignant cells-metastatic adenocarcinoma. -Patient does have metastatic lung cancer and is back on the Mekinist and Tafinlar -Chest tube has been removed and repeat chest x-ray showed left worsening hydropneumothorax however she does not want a repeat thoracentesis or Pleurx at this time.  Repeat chest x-ray this morning was stable from yesterday and she will follow-up with pulm in outpatient   Invasive ductal carcinoma of the breast -Sees oncology Dr. Marin Olp -Continue with letrozole 2.5 mg p.o. daily -We will follow-up with Dr. Marin Olp in the outpatient setting in 1 to 2 weeks   Normocytic Anemia -Hemoglobin/hematocrit is now stable at 10.3/32.7 -Check anemia panel as an outpatient -Continue to monitor for signs and symptoms of bleeding; currently no overt bleeding noted -Repeat CBC in a.m.   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 -Continue to Monitor and Trend and repeat CMP in the AM     History of Pericardial Effusion -Seen on echo on 01/18/2021 and noted to be Moderate without no grossly evidence of Tamponade -Asymptomatic -Continue to Monitor in outpatient setting with PCP and cardiology   Hypothyroidism -Continue Armour thyroid 60 mg po Daily    Diabetes Mellitus Type 2 -CBG has been well controlled; hemoglobin A1c 6.2 -Continue metformin as Patient wants to continue metformin in the hospital -CBG's ranging from 77-156   Hypomagnesemia -Patient's magnesium level is 1.9 -Replete with IV mag sulfate 2 g yesterday -Continue to monitor and replete as necessary  Thrombocytosis  -Likely reactive.  Patient's platelet count  went from 473 is now 461 -Continue monitor and trend in the outpatient setting  Discharge Instructions  Discharge Instructions     Call MD for:  difficulty breathing, headache or visual disturbances   Complete by: As directed    Call MD for:  extreme fatigue   Complete by: As directed    Call MD for:  hives   Complete by: As directed    Call MD for:  persistant dizziness or light-headedness   Complete by: As directed    Call MD for:  persistant nausea and vomiting   Complete by: As directed    Call MD for:  redness, tenderness, or signs of infection (pain, swelling, redness, odor or green/yellow discharge around incision site)   Complete by: As directed    Call MD for:  severe uncontrolled pain   Complete by: As directed    Call MD for:  temperature >100.4   Complete by: As directed    Diet - low sodium heart healthy   Complete by: As directed    Diet general   Complete by: As directed    Discharge instructions   Complete by: As directed    You were cared for by a hospitalist during your hospital stay. If you have any questions about your discharge medications or the care you received while you were in the hospital after you are discharged, you can call the unit and ask to speak with the hospitalist on call if the hospitalist that took care of you is not available. Once you are discharged, your primary  care physician will handle any further medical issues. Please note that NO REFILLS for any discharge medications will be authorized once you are discharged, as it is imperative that you return to your primary care physician (or establish a relationship with a primary care physician if you do not have one) for your aftercare needs so that they can reassess your need for medications and monitor your lab values.  Follow up with PCP, Neuro-oncology, Radiation Oncology, Medical Oncology, Neurosurgery. Take all medications as prescribed. If symptoms change or worsen please return to the ED for  evaluation   Driving Restrictions   Complete by: As directed    [place] Per [facility] statutes, patients with seizures are not allowed to drive until they have been seizure-free for six months.   Use caution when using heavy equipment or power tools. Avoid working on ladders or at heights. Take showers instead of baths. Ensure the water temperature is not too high on the home water heater. Do not go swimming alone. Do not lock yourself in a room alone (i.e. bathroom). When caring for infants or small children, sit down when holding, feeding, or changing them to minimize risk of injury to the child in the event you have a seizure. Maintain good sleep hygiene. Avoid alcohol.    If patient has another seizure, call 911 and bring them back to the ED if: A.  The seizure lasts longer than 5 minutes.      B.  The patient doesn't wake shortly after the seizure or has new problems such as difficulty seeing, speaking or moving following the seizure C.  The patient was injured during the seizure D.  The patient has a temperature over 102 F (39C) E.  The patient vomited during the seizure and now is having trouble breathing   Increase activity slowly   Complete by: As directed    No wound care   Complete by: As directed    No wound care   Complete by: As directed       Allergies as of 02/05/2021   No Known Allergies      Medication List     TAKE these medications    acetaminophen 325 MG tablet Commonly known as: TYLENOL Take 2 tablets (650 mg total) by mouth every 6 (six) hours as needed for mild pain, moderate pain or fever.   CO Q 10 PO Take 300 mg by mouth at bedtime.   dabrafenib mesylate 50 MG capsule Commonly known as: TAFLINAR Take 100 mg by mouth 2 (two) times daily. Take on an empty stomach 1 hour before or 2 hours after meals.   DANDELION PO Take 1 capsule by mouth daily.   dexamethasone 4 MG tablet Commonly known as: DECADRON Take 1 tablet (4 mg  total) by mouth every 12 (twelve) hours.   diphenhydrAMINE-zinc acetate cream Commonly known as: BENADRYL Apply topically as needed for itching (as needed for itching as labeled).   dronabinol 5 MG capsule Commonly known as: MARINOL Take 1 capsule (5 mg total) by mouth 2 (two) times daily before a meal.   feeding supplement Liqd Take 237 mLs by mouth 3 (three) times daily between meals.   ibuprofen 200 MG tablet Commonly known as: ADVIL Take 400 mg by mouth every 6 (six) hours as needed for mild pain.   IVERMECTIN PO Take 1.8 mLs by mouth daily. Ivermectin oil - 10 mg/ml   lacosamide 50 MG Tabs tablet Commonly known as: VIMPAT Take 1  tablet (50 mg total) by mouth 2 (two) times daily.   letrozole 2.5 MG tablet Commonly known as: FEMARA Take 2.5 mg by mouth daily.   Magic Mushroom Mix Caps Take 1 tablet by mouth daily.   metFORMIN 750 MG 24 hr tablet Commonly known as: GLUCOPHAGE-XR Take 750 mg by mouth 2 (two) times daily.   methocarbamol 500 MG tablet Commonly known as: ROBAXIN Take 1 tablet (500 mg total) by mouth every 6 (six) hours as needed for muscle spasms.   multivitamin with minerals tablet Take 1 tablet by mouth daily.   NP Thyroid 60 MG tablet Generic drug: thyroid Take 60 mg by mouth daily.   ondansetron 4 MG disintegrating tablet Commonly known as: Zofran ODT Take 1 tablet (4 mg total) by mouth every 8 (eight) hours as needed for nausea or vomiting.   OVER THE COUNTER MEDICATION Take 2 capsules by mouth 3 (three) times daily. Protectival   polyethylene glycol 17 g packet Commonly known as: MIRALAX / GLYCOLAX Take 17 g by mouth daily. What changed:  when to take this reasons to take this   senna 8.6 MG Tabs tablet Commonly known as: SENOKOT Take 1 tablet (8.6 mg total) by mouth daily.   trametinib dimethyl sulfoxide 2 MG tablet Commonly known as: MEKINIST Take 1 tablet (2 mg total) by mouth daily. Take 1 hour before or 2 hours after a meal.  Store refrigerated in original container.   Zinc 100 MG Tabs Take 100 mg by mouth daily.        Follow-up Information     Gaynelle Cage, MD. Call.   Specialty: General Practice Why: Follow up within 1-2 weeks Contact information: [address] [place] [facility] [address] [phone]         Ventura Sellers, MD. Call.   Specialties: Psychiatry, Neurology, Oncology Why: Follow up within 1-week Contact information: [place] [address] [phone]         Volanda Napoleon, MD. Call.   Specialty: Oncology Why: Follow up within 1 week Contact information: [place] [address] [phone]                No Known Allergies  Consultations: Medical Oncology Radiation Oncology Pulmonary Discussed with Neuro-Oncology Palliative Care  Procedures/Studies: DG Chest 1 View  Result Date: 02/01/2021 CLINICAL DATA:  Left chest tube. EXAM: CHEST  1 VIEW COMPARISON:  01/31/2021 and CT chest 01/19/2021. FINDINGS: Trachea is midline. Heart size stable. Persistent left lower lobe collapse/consolidation. Small to moderate left hydropneumothorax with a small bore drain in place, as before. Associated subcutaneous emphysema in the left axillary region. Right lung is clear. IMPRESSION: 1. Small to moderate left hydropneumothorax with drain in place, unchanged. 2. Persistent left lower lobe collapse/consolidation, possibly representing pneumonia. Electronically Signed   By: Lorin Picket M.D.   On: 02/01/2021 11:23   DG Chest 1 View  Result Date: 01/21/2021 CLINICAL DATA:  Post thoracentesis EXAM: CHEST  1 VIEW COMPARISON:  Chest radiograph 1 day prior FINDINGS: The cardiomediastinal silhouette is within normal limits. The left pleural effusion has decreased in size following thoracentesis with improved aeration of the upper lung. No pneumothorax is seen; a vertically line projecting over the mid left hemithorax likely reflects  skinfold artifact. The right lung is clear. There is no right pleural effusion or pneumothorax The bones are stable. IMPRESSION: Decreased left pleural effusion with improved aeration of the left lung following thoracentesis. No pneumothorax identified. Electronically  Signed   By: Valetta Mole MD   On: 01/21/2021 13:15   DG Chest 1 View  Result Date: 01/20/2021 CLINICAL DATA:  History of metastatic adenocarcinoma and breast cancer with shortness of breath following echocardiogram EXAM: CHEST  1 VIEW COMPARISON:  Radiograph and CT 01/19/2021 FINDINGS: Subtotal opacification of the left hemithorax with some minimally residually aerated lung in the left lung apex within which are areas of more patchy consolidative opacity quite similar appearance to comparison CT. Right lung is relatively clear. Cardiomediastinal contours are partly obscured by overlying opacity though appear somewhat more prominent than on comparison portable radiography possibly attributable to a pericardial effusion seen on CT imaging as well. Telemetry leads overlie the chest. Degenerative changes are present in the imaged spine and shoulders. IMPRESSION: Large left effusion passive atelectasis opacified much of the left hemithorax. More patchy airspace disease is seen in the residually aerated portion of the left upper lobe. Possible re-expansion edema or pneumonia. Right lung is relatively clear. Prominent cardiac silhouette partly attributable to a pericardial effusion seen on CT. Electronically Signed   By: Lovena Le M.D.   On: 01/20/2021 19:18   DG Chest 1 View  Result Date: 01/19/2021 CLINICAL DATA:  Status post thoracentesis.  2.4 L removed. EXAM: CHEST  1 VIEW COMPARISON:  Chest radiograph 01/18/2021 FINDINGS: Post LEFT thoracentesis with return of the mediastinal structures to midline following fluid removal. A small amount of aerated lung in the LEFT upper lobe is not demonstrated although there is continued near complete  opacification of the LEFT hemithorax. No pneumothorax evident. RIGHT lung is clear without edema or pneumothorax. IMPRESSION: 1. Return of mediastinal structures to midline following thoracentesis. 2. A small amount of aerated lung is now evident in LEFT upper lobe following thoracentesis. LEFT hemithorax remains near completely opacified. 3. No pneumothorax. Electronically Signed   By: Suzy Bouchard M.D.   On: 01/19/2021 10:38   CT HEAD WO CONTRAST (5MM)  Result Date: 01/28/2021 CLINICAL DATA:  Seizure EXAM: CT HEAD WITHOUT CONTRAST TECHNIQUE: Contiguous axial images were obtained from the base of the skull through the vertex without intravenous contrast. COMPARISON:  Brain MRI 05/19/2020 FINDINGS: Brain: There is ill-defined hypodensity in the left frontal lobe subcortical white matter along the orbital gyrus (2-10, 4-15, 5-36). There is no evidence of acute intracranial hemorrhage or territorial infarct. The ventricles are not enlarged. There is no midline shift. Vascular: No hyperdense vessel or unexpected calcification. Skull: Normal. Negative for fracture or focal lesion. Sinuses/Orbits: The imaged paranasal sinuses are clear. The imaged globes and orbits are unremarkable. Other: The mastoid air cells are clear. IMPRESSION: Ill-defined enhancement in the left frontal lobe as above. 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 Electronically Signed   By: Valetta Mole M.D.   On: 01/28/2021 14:26   CT CHEST W CONTRAST  Result Date: 01/19/2021 CLINICAL DATA:  Abnormal chest x-ray. EXAM: CT CHEST WITH CONTRAST TECHNIQUE: Multidetector CT imaging of the chest was performed during intravenous contrast administration. CONTRAST:  41mL OMNIPAQUE IOHEXOL 350 MG/ML SOLN COMPARISON:  Chest CT 04/16/2020.  Chest x-ray today. FINDINGS: Cardiovascular: Heart is normal size. Small pericardial effusion. Aorta is normal  caliber. Mediastinum/Nodes: No mediastinal, hilar, or axillary adenopathy. Trachea and esophagus are unremarkable. Thyroid unremarkable. Lungs/Pleura: Large left pleural effusion. Only the left upper lobe is aerated with extensive airspace disease throughout the left upper lobe. Left lower lobe is collapsed  which could be related to compressive atelectasis. No confluent opacity or effusion on the right. Linear atelectasis or scarring in the right middle lobe. Upper Abdomen: Imaging into the upper abdomen demonstrates no acute findings. Musculoskeletal: Chest wall soft tissues are unremarkable. There is a new sclerotic lesion within the anterior L1 vertebral body. No additional focal bone lesion or acute bony abnormality. IMPRESSION: Large left pleural effusion. Complete opacification of the left lower lobe with some aeration of the left upper lobe. Extensive airspace disease throughout the left upper lobe. This could reflect pneumonia or re-expansion edema following prior thoracentesis. No pneumothorax. Small pericardial effusion. New sclerotic lesion anteriorly within the L1 vertebral body. Cannot exclude sclerotic metastasis. Consider further evaluation with nuclear medicine bone scan or MRI. Electronically Signed   By: Rolm Baptise M.D.   On: 01/19/2021 18:38   MR BRAIN W WO CONTRAST  Result Date: 02/03/2021 CLINICAL DATA:  Brain/CNS neoplasm, staging. Metastatic lung cancer. EXAM: MRI HEAD WITHOUT AND WITH CONTRAST TECHNIQUE: Multiplanar, multiecho pulse sequences of the brain and surrounding structures were obtained without and with intravenous contrast. CONTRAST:  5.42mL GADAVIST GADOBUTROL 1 MMOL/ML IV SOLN COMPARISON:  01/28/2021 FINDINGS: Brain: There are a total of 8 subcentimeter enhancing brain lesions, some of which are more conspicuous and/or slightly larger on today's 3 T MRI compared to the prior 1.5 T study. The lesions are annotated on series 1100 and are located as follows: left cerebellum  (image 136), right cerebellum (image 146), right cerebellum (image 157), left frontal lobe (image 190), right frontal lobe (image 218), left parietal lobe (image 220), left frontal lobe (image 227), and right frontal lobe (image 231). Mild edema associated with several lesions is unchanged, greatest about the 5 mm left frontal lesion (image 190). A few foci of vascular enhancement are noted including in the right (image 211) and left parietal regions (image 206) and medial right cerebellum (image 138). No acute infarct, intracranial hemorrhage, midline shift, or extra-axial fluid collection is evident. Scattered small T2 hyperintensities in the cerebral white matter and pons are nonspecific but compatible with mild chronic small vessel ischemic disease. There is mild cerebral atrophy. A tiny chronic right cerebellar infarct is noted. Vascular: Major intracranial vascular flow voids are preserved. Skull and upper cervical spine: Unremarkable bone marrow signal. Sinuses/Orbits: Unremarkable orbits. Minimal mucosal thickening in the ethmoid sinuses. Clear mastoid air cells. Other: None. IMPRESSION: 1. 8 subcentimeter brain metastases with up to mild edema. 2. Mild chronic small vessel ischemic disease. Electronically Signed   By: Logan Bores M.D.   On: 02/03/2021 10:33   MR Brain W and Wo Contrast  Result Date: 01/28/2021 CLINICAL DATA:  Seizure, nontraumatic (Age >= 65y). EXAM: MRI HEAD WITHOUT AND WITH CONTRAST TECHNIQUE: Multiplanar, multiecho pulse sequences of the brain and surrounding structures were obtained without and with intravenous contrast. CONTRAST:  98mL GADAVIST GADOBUTROL 1 MMOL/ML IV SOLN COMPARISON:  Head CT 01/28/2021. FINDINGS: Brain: Cerebral volume is normal. There are multiple enhancing intracranial lesions compatible with intracranial metastatic disease, as follows. 4 mm enhancing lesion within the right frontal lobe with mild surrounding edema (series 16, image 96). 3 mm enhancing lesion  within the posterior left frontal lobe with mild surrounding edema (series 16, image 98). 6 mm enhancing lesion within the anterior left frontal lobe with mild surrounding edema (series 16, image 67). 2 mm enhancing lesion within the left parietal lobe with mild surrounding edema (series 16, image 91). 2 mm enhancing lesion within the right cerebellar hemisphere (series  16, image 29). 2 mm enhancing lesion within the left cerebellar hemisphere (series 16, image 18). Mild multifocal T2/FLAIR hyperintensity elsewhere within the cerebral white matter and pons is nonspecific, but compatible chronic small vessel ischemic disease. There is a punctate focus of apparent diffusion weighted hyperintensity within the right cerebellum appreciated on the axial diffusion-weighted imaging only (series 5, image 55). No chronic intracranial blood products. No extra-axial fluid collection. No midline shift. Vascular: Maintained flow voids within the proximal large arterial vessels. Skull and upper cervical spine: No focal suspicious marrow lesion. Sinuses/Orbits: Visualized orbits show no acute finding. Trace bilateral ethmoid sinus mucosal thickening. IMPRESSION: At least six enhancing metastatic lesions are identified within the supratentorial and infratentorial brain, measuring up to 4 mm, some with mild surrounding edema, as detailed. Punctate focus of diffusion weighted signal abnormality within the medial right cerebellum, appreciated on the axial diffusion-weighted sequence only. This may reflect an acute infarct, a poorly enhancing metastasis or artifact. Background mild chronic small vessel ischemic changes within the cerebral white matter and pons. Electronically Signed   By: Kellie Simmering D.O.   On: 01/28/2021 18:03   DG CHEST PORT 1 VIEW  Result Date: 02/05/2021 CLINICAL DATA:  70 year old female with shortness of breath. Metastatic lung cancer, recurrent pleural effusion. EXAM: PORTABLE CHEST 1 VIEW COMPARISON:   Portable chest 02/04/2021 and earlier. FINDINGS: Portable AP semi upright view at 0353 hours. Stable mediastinal contour. Lower lung volumes. Veiling opacity in the left medial and lower lung compatible with residual pleural effusion. And there is evidence that pleural fluid now occupies the apical pleural space where air was seen previously. A small volume of left axillary subcutaneous gas is stable. Left pleural drain was in place earlier this month. No superimposed pneumothorax, pulmonary edema, or consolidation. Mild crowding of markings in the right lung. No acute osseous abnormality identified. IMPRESSION: Lower lung volumes. Left pleural effusion persists but no pleural air seen now. No new cardiopulmonary abnormality. Electronically Signed   By: Genevie Ann M.D.   On: 02/05/2021 04:21   DG CHEST PORT 1 VIEW  Result Date: 02/04/2021 CLINICAL DATA:  Left chest tube removal EXAM: PORTABLE CHEST 1 VIEW COMPARISON:  02/01/2021 FINDINGS: Single frontal view of the chest demonstrates interval removal of the left chest tube. There is a persistent left-sided hydropneumothorax. Gas component at the left apex is unchanged since prior study. There has been increase in opacity at the left lung base compatible with enlarging fluid component and underlying lung consolidation. Right chest is clear. Cardiac silhouette is unremarkable. No acute bony abnormalities. Minimal residual gas within the soft tissues of the left chest wall. IMPRESSION: 1. Persistent left-sided hydropneumothorax. Stable gas component since previous examination. Increasing opacity at the left lung base likely representing progressive left lung consolidation and enlarging fluid component of the hydropneumothorax. These results will be called to the ordering clinician or representative by the Radiologist Assistant, and communication documented in the PACS or [employer]. Electronically Signed   By: Randa Ngo M.D.   On: 02/04/2021 16:22   DG  CHEST PORT 1 VIEW  Result Date: 01/31/2021 CLINICAL DATA:  Lt sided chest tube EXAM: PORTABLE CHEST - 1 VIEW COMPARISON:  the previous day's study FINDINGS: Left pigtail chest tube stable. Persistent left apical pneumothorax, lung apex projecting inferior to the posterior aspect left third rib as before. Consolidation/atelectasis in the left lower lung. Heart size and mediastinal contours are within normal limits. Possible layering left pleural effusion.  Right lung clear. Visualized  bones unremarkable. IMPRESSION: 1. Stable left chest tube with persistent apical pneumothorax. 2. Persistent left lower lung consolidation/atelectasis with possible effusion. Electronically Signed   By: Lucrezia Europe M.D.   On: 01/31/2021 12:12   DG CHEST PORT 1 VIEW  Result Date: 01/30/2021 CLINICAL DATA:  Diabetes, hydropneumothorax EXAM: PORTABLE CHEST 1 VIEW COMPARISON:  01/29/2021 FINDINGS: Left basilar pigtail chest tube is unchanged in position. Pulmonary insufflation is stable. Mild left-sided volume loss is unchanged. Opacification of the left lung base likely relates to residual pleural fluid, similar to that noted on prior examination. Small apical pneumothorax is unchanged. Subcutaneous gas is again seen within the left chest wall. The right lung is clear. No pneumothorax or pleural effusion on the right. Cardiac size is within normal limits. Retrocardiac opacity is unchanged. IMPRESSION: Stable small left hydropneumothorax. Left basilar chest tube is unchanged. Unchanged retrocardiac opacity corresponding to the known mass seen on recent CT examination. Stable pulmonary insufflation. Electronically Signed   By: Fidela Salisbury M.D.   On: 01/30/2021 00:27   DG CHEST PORT 1 VIEW  Result Date: 01/29/2021 CLINICAL DATA:  Chest tube with hydropneumothorax EXAM: PORTABLE CHEST 1 VIEW COMPARISON:  01/28/2021 FINDINGS: Left chest tube in place. Left apical pneumothorax reaching the fourth posterior rib. Soft tissue emphysema.  Hazy density with probable fissural component at the left base is unchanged. Known left lower lobe mass. Clear right lung and stable heart size. IMPRESSION: Unchanged left hydropneumothorax and lower lobe opacification. Electronically Signed   By: Monte Fantasia M.D.   On: 01/29/2021 05:45   DG CHEST PORT 1 VIEW  Result Date: 01/28/2021 CLINICAL DATA:  Status post chest tube placement EXAM: PORTABLE CHEST 1 VIEW COMPARISON:  Film from earlier in the same day. FINDINGS: Pigtail catheter is now seen on the left with significant reduction in pleural effusion. The lung is incompletely re-expanded with both an apical and inferolateral pneumothorax component. These changes are likely related to incomplete re-expansion of the lung as opposed to a true pneumothorax. This is related to the longstanding pleural effusion. Right lung is clear. Cardiac shadow is within normal limits. IMPRESSION: Status post chest tube placement with significant reduction in left-sided pleural effusion. There remains left lung consolidation and incomplete re-expansion as described related to the longstanding nature of the effusion as opposed to a true pneumothorax. Follow-up films are recommended. Electronically Signed   By: Inez Catalina M.D.   On: 01/28/2021 17:01   DG Chest Portable 1 View  Addendum Date: 01/28/2021   ADDENDUM REPORT: 01/28/2021 13:43 ADDENDUM: Critical Value/emergent results were called by telephone at the time of interpretation on 01/28/2021 at 1:36 pm to provider Davonna Belling , who verbally acknowledged these results. Electronically Signed   By: Marijo Conception M.D.   On: 01/28/2021 13:43   Result Date: 01/28/2021 CLINICAL DATA:  Syncope. EXAM: PORTABLE CHEST 1 VIEW COMPARISON:  January 21, 2021. FINDINGS: Stable cardiomediastinal silhouette. Right lung is clear. Left apical pneumothorax is noted with large pleural effusion. Bony thorax is unremarkable. IMPRESSION: Large left-sided hydropneumothorax is noted.  Electronically Signed: By: Marijo Conception M.D. On: 01/28/2021 13:36   DG Chest Port 1 View  Result Date: 01/18/2021 CLINICAL DATA:  Shortness of breath.  Stage IV lung cancer EXAM: PORTABLE CHEST 1 VIEW COMPARISON:  04/30/2020 FINDINGS: Complete opacification of the left hemithorax with rightward displacement of the heart and mediastinal structures. The visualized portions of the right lung are grossly clear. No appreciable right-sided pleural fluid collection. No pneumothorax.  IMPRESSION: Complete opacification of the left hemithorax. Findings likely represent a combination of a large pleural effusion and atelectasis. Electronically Signed   By: Davina Poke D.O.   On: 01/18/2021 14:18   EEG adult  Result Date: 01/29/2021 Lora Havens, MD     01/29/2021 12:04 PM Patient Name: Katerina Zurn MRN: [id number] Epilepsy Attending: Lora Havens Referring Physician/Provider: Dr Cherylann Ratel Date: 01/29/2021 Duration: 25 mins Patient history: 70yo F who presented with syncope. 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 EEG to evaluate for seizure. Level of alertness: Awake, drowsy AEDs during EEG study: LEV Technical aspects: This EEG study was done with scalp electrodes positioned according to the 10-20 International system of electrode placement. Electrical activity was acquired at a sampling rate of 500Hz  and reviewed with a high frequency filter of 70Hz  and a low frequency filter of 1Hz . EEG data were recorded continuously and digitally stored. Description: The posterior dominant rhythm consists of 9 Hz activity of moderate voltage (25-35 uV) seen predominantly in posterior head regions, symmetric and reactive to eye opening and eye closing. Drowsiness was characterized by attenuation of the posterior background rhythm. EEG showed intermittent generalized 3 to 6  Hz theta-delta slowing. Hyperventilation and photic stimulation were not performed.   ABNORMALITY - Intermittent slow, generalized IMPRESSION: This study is suggestive of mild diffuse encephalopathy, nonspecific etiology. No seizures or epileptiform discharges were seen throughout the recording. Lora Havens   ECHOCARDIOGRAM COMPLETE  Result Date: 01/18/2021    ECHOCARDIOGRAM REPORT   Patient Name:   KEILANA MORLOCK Date of Exam: 01/18/2021 Medical Rec #:  [id number]          Height:       62.0 in Accession #:    [id number]         Weight:       133.0 lb Date of Birth:  February 12, 1951          BSA:          1.607 m Patient Age:    23 years           BP:           165/110 mmHg Patient Gender: F                  HR:           101 bpm. Exam Location:  [facility] Procedure: 2D Echo, Cardiac Doppler, Color Doppler and Strain Analysis                    STAT ECHO Reported to: Dr Stanford Breed on 01/18/2021 1:35:00 PM                   tamponade. Indications:    Pericardial effusion  History:        Patient has no prior history of Echocardiogram examinations.  Sonographer:    Luisa Hart RDCS Referring Phys: [place]  1. Left ventricular ejection fraction, by estimation, is 55 to 60%. The left ventricle has normal function. The left ventricle has no regional wall motion abnormalities. Left ventricular diastolic parameters are consistent with Grade I diastolic dysfunction (impaired relaxation).  2. Right ventricular systolic function is normal. The right ventricular size is normal.  3. Grossly no evidence of tamponade. Moderate pericardial effusion. The pericardial effusion is circumferential. Large pleural effusion in  the left lateral region.  4. The mitral valve is normal in structure. Mild mitral valve regurgitation. No evidence of mitral stenosis.  5. The aortic valve is normal in structure. Aortic valve regurgitation is not visualized. No aortic stenosis is present.  6. The inferior vena cava is  normal in size with greater than 50% respiratory variability, suggesting right atrial pressure of 3 mmHg. FINDINGS  Left Ventricle: Left ventricular ejection fraction, by estimation, is 55 to 60%. The left ventricle has normal function. The left ventricle has no regional wall motion abnormalities. The left ventricular internal cavity size was normal in size. There is  no left ventricular hypertrophy. Left ventricular diastolic parameters are consistent with Grade I diastolic dysfunction (impaired relaxation). Right Ventricle: The right ventricular size is normal. No increase in right ventricular wall thickness. Right ventricular systolic function is normal. Left Atrium: Left atrial size was normal in size. Right Atrium: Right atrial size was normal in size. Pericardium: Grossly no evidence of tamponade. A moderately sized pericardial effusion is present. The pericardial effusion is circumferential. Mitral Valve: The mitral valve is normal in structure. Mild mitral valve regurgitation. No evidence of mitral valve stenosis. Tricuspid Valve: The tricuspid valve is normal in structure. Tricuspid valve regurgitation is not demonstrated. No evidence of tricuspid stenosis. Aortic Valve: The aortic valve is normal in structure. Aortic valve regurgitation is not visualized. No aortic stenosis is present. Aortic valve mean gradient measures 2.0 mmHg. Aortic valve peak gradient measures 6.2 mmHg. Pulmonic Valve: The pulmonic valve was normal in structure. Pulmonic valve regurgitation is not visualized. No evidence of pulmonic stenosis. Aorta: The aortic root is normal in size and structure. Venous: The inferior vena cava is normal in size with greater than 50% respiratory variability, suggesting right atrial pressure of 3 mmHg. IAS/Shunts: No atrial level shunt detected by color flow Doppler. Additional Comments: There is a large pleural effusion in the left lateral region.   LV Volumes (MOD) LV vol d, MOD A2C: 51.1 ml  Diastology LV vol d, MOD A4C: 48.4 ml LV e' medial:    4.68 cm/s LV vol s, MOD A2C: 37.5 ml LV E/e' medial:  14.0 LV vol s, MOD A4C: 26.8 ml LV e' lateral:   5.33 cm/s LV SV MOD A2C:     13.6 ml LV E/e' lateral: 12.3 LV SV MOD A4C:     48.4 ml LV SV MOD BP:      18.8 ml LEFT ATRIUM             Index LA Vol (A2C):   33.3 ml 20.72 ml/m LA Vol (A4C):   28.1 ml 17.48 ml/m LA Biplane Vol: 31.5 ml 19.60 ml/m  AORTIC VALVE AV Vmax:           125.00 cm/s AV Vmean:          71.300 cm/s AV VTI:            0.176 m AV Peak Grad:      6.2 mmHg AV Mean Grad:      2.0 mmHg LVOT Vmax:         80.00 cm/s LVOT Vmean:        53.100 cm/s LVOT VTI:          0.116 m LVOT/AV VTI ratio: 0.66 MITRAL VALVE MV Area (PHT): 4.39 cm    SHUNTS MV Decel Time: 173 msec    Systemic VTI: 0.12 m MR Peak grad: 151.8 mmHg MR Vmax:  616.00 cm/s MV E velocity: 65.60 cm/s MV A velocity: 93.50 cm/s MV E/A ratio:  0.70 Jenne Campus MD Electronically signed by Jenne Campus MD Signature Date/Time: 01/18/2021/3:59:55 PM    Final    US THORACENTESIS ASP PLEURAL SPACE W/IMG GUIDE  Result Date: 01/21/2021 INDICATION: Patient with history of metastatic lung cancer, dyspnea, recurrent left pleural effusion. Request received for therapeutic left thoracentesis. EXAM: ULTRASOUND GUIDED THERAPEUTIC LEFT THORACENTESIS MEDICATIONS: 1% lidocaine to skin and subcutaneous tissue COMPLICATIONS: None immediate. PROCEDURE: An ultrasound guided thoracentesis was thoroughly discussed with the patient and questions answered. The benefits, risks, alternatives and complications were also discussed. The patient understands and wishes to proceed with the procedure. Written consent was obtained. Ultrasound was performed to localize and mark an adequate pocket of fluid in the left chest. The area was then prepped and draped in the normal sterile fashion. 1% Lidocaine was used for local anesthesia. Under ultrasound guidance a 6 Fr Safe-T-Centesis catheter was  introduced. Thoracentesis was performed. The catheter was removed and a dressing applied. FINDINGS: A total of approximately 1.8 liters of hazy,amber fluid was removed. IMPRESSION: Successful ultrasound guided therapeutic left thoracentesis yielding 1.8 liters of pleural fluid. Read by: Rowe Robert, PA-C Electronically Signed   By: Albin Felling MD   On: 01/21/2021 12:52   US THORACENTESIS ASP PLEURAL SPACE W/IMG GUIDE  Result Date: 01/19/2021 INDICATION: Patient with history of metastatic lung cancer, dyspnea, left pleural effusion. Request received for diagnostic and therapeutic left thoracentesis. EXAM: ULTRASOUND GUIDED DIAGNOSTIC AND THERAPEUTIC LEFT THORACENTESIS MEDICATIONS: 1% lidocaine to skin and subcutaneous tissue COMPLICATIONS: None immediate. PROCEDURE: An ultrasound guided thoracentesis was thoroughly discussed with the patient and questions answered. The benefits, risks, alternatives and complications were also discussed. The patient understands and wishes to proceed with the procedure. Written consent was obtained. Ultrasound was performed to localize and mark an adequate pocket of fluid in the left chest. The area was then prepped and draped in the normal sterile fashion. 1% Lidocaine was used for local anesthesia. Under ultrasound guidance a 6 Fr Safe-T-Centesis catheter was introduced. Thoracentesis was performed. The catheter was removed and a dressing applied. FINDINGS: A total of approximately 2.4 liters of hazy,amber fluid was removed. Samples were sent to the laboratory as requested by the clinical team. Due to pt coughing /chest discomfort only the above amount of fluid was removed today. IMPRESSION: Successful ultrasound guided diagnostic and therapeutic left thoracentesis yielding 2.4 liters of pleural fluid. Read by: Rowe Robert, PA-C Electronically Signed   By: Jerilynn Mages.  Shick M.D.   On: 01/19/2021 10:21    EEG Description: The posterior dominant rhythm consists of 9 Hz activity of  moderate voltage (25-35 uV) seen predominantly in posterior head regions, symmetric and reactive to eye opening and eye closing. Drowsiness was characterized by attenuation of the posterior background rhythm. EEG showed intermittent generalized 3 to 6 Hz theta-delta slowing. Hyperventilation and photic stimulation were not performed.      ABNORMALITY - Intermittent slow, generalized   IMPRESSION: This study is suggestive of mild diffuse encephalopathy, nonspecific etiology. No seizures or epileptiform discharges were seen throughout the recording.   Subjective: Seen and examined at bedside and felt well.  Had no issues and wanting to go home.  Denies any chest pain or shortness of breath.  Understands that she will follow-up with several specialist in the next coming weeks.  No other concerns or plans at time and ready to go home  Discharge Exam: Vitals:   02/04/21 1205 02/05/21  0400  BP: 114/78 121/80  Pulse: 81 82  Resp: 20 16  Temp: 98.5 F (36.9 C) 97.9 F (36.6 C)  SpO2: 99% 93%   Vitals:   02/04/21 0700 02/04/21 0706 02/04/21 1205 02/05/21 0400  BP:  133/80 114/78 121/80  Pulse:  79 81 82  Resp:  18 20 16   Temp:  98 F (36.7 C) 98.5 F (36.9 C) 97.9 F (36.6 C)  TempSrc:  Oral  Oral  SpO2:  98% 99% 93%  Weight: 54.2 kg   54.6 kg  Height:       General: Pt is alert, awake, not in acute distress Cardiovascular: RRR, S1/S2 +, no rubs, no gallops Respiratory: Diminished bilaterally worse on the left compared to right, no wheezing, no rhonchi; unlabored breathing Abdominal: Soft, NT, ND, bowel sounds + Extremities: no edema, no cyanosis  The results of significant diagnostics from this hospitalization (including imaging, microbiology, ancillary and laboratory) are listed below for reference.    Microbiology: Recent Results (from the past 240 hour(s))  Resp Panel by RT-PCR (Flu A&B, Covid) Nasopharyngeal Swab     Status: None   Collection Time: 01/28/21  1:01 PM    Specimen: Nasopharyngeal Swab; Nasopharyngeal(NP) swabs in vial transport medium  Result Value Ref Range Status   SARS Coronavirus 2 by RT PCR NEGATIVE NEGATIVE Final    Comment: (NOTE) SARS-CoV-2 target nucleic acids are NOT DETECTED.  The SARS-CoV-2 RNA is generally detectable in upper respiratory specimens during the acute phase of infection. The lowest concentration of SARS-CoV-2 viral copies this assay can detect is 138 copies/mL. A negative result does not preclude SARS-Cov-2 infection and should not be used as the sole basis for treatment or other patient management decisions. A negative result may occur with  improper specimen collection/handling, submission of specimen other than nasopharyngeal swab, presence of viral mutation(s) within the areas targeted by this assay, and inadequate number of viral copies(<138 copies/mL). A negative result must be combined with clinical observations, patient history, and epidemiological information. The expected result is Negative.  Fact Sheet for Patients:  EntrepreneurPulse.com.au  Fact Sheet for Healthcare Providers:  IncredibleEmployment.be  This test is no t yet approved or cleared by the [place] FDA and  has been authorized for detection and/or diagnosis of SARS-CoV-2 by FDA under an Emergency Use Authorization (EUA). This EUA will remain  in effect (meaning this test can be used) for the duration of the COVID-19 declaration under Section 564(b)(1) of the Act, 21 U.S.C.section 360bbb-3(b)(1), unless the authorization is terminated  or revoked sooner.       Influenza A by PCR NEGATIVE NEGATIVE Final   Influenza B by PCR NEGATIVE NEGATIVE Final    Comment: (NOTE) The Xpert Xpress SARS-CoV-2/FLU/RSV plus assay is intended as an aid in the diagnosis of influenza from Nasopharyngeal swab specimens and should not be used as a sole basis for treatment. Nasal washings and aspirates are  unacceptable for Xpert Xpress SARS-CoV-2/FLU/RSV testing.  Fact Sheet for Patients: EntrepreneurPulse.com.au  Fact Sheet for Healthcare Providers: IncredibleEmployment.be  This test is not yet approved or cleared by the [place] FDA and has been authorized for detection and/or diagnosis of SARS-CoV-2 by FDA under an Emergency Use Authorization (EUA). This EUA will remain in effect (meaning this test can be used) for the duration of the COVID-19 declaration under Section 564(b)(1) of the Act, 21 U.S.C. section 360bbb-3(b)(1), unless the authorization is terminated or revoked.  Performed at [facility], [address]  [place], [place] [address]   MRSA Next Gen by PCR, Nasal     Status: None   Collection Time: 01/29/21  6:53 PM   Specimen: Nasal Mucosa; Nasal Swab  Result Value Ref Range Status   MRSA by PCR Next Gen NOT DETECTED NOT DETECTED Final    Comment: (NOTE) The GeneXpert MRSA Assay (FDA approved for NASAL specimens only), is one component of a comprehensive MRSA colonization surveillance program. It is not intended to diagnose MRSA infection nor to guide or monitor treatment for MRSA infections. Test performance is not FDA approved in patients less than 68 years old. Performed at [facility], [address] [place], [place] [address]     Labs: BNP (last 3 results) Recent Labs    01/18/21 1400  BNP 17.6   Basic Metabolic Panel: Recent Labs  Lab 01/30/21 0210 02/01/21 0437 02/03/21 0509 02/04/21 0429 02/05/21 0457  NA 134* 138 137 139 136  K 4.2 4.5 4.3 4.3 4.4  CL 101 104 102 104 100  CO2 24 26 27 28 29   GLUCOSE 128* 129* 117* 136* 123*  BUN 10 24* 18 16 17   CREATININE 0.45 0.55 0.51 0.46 0.57  CALCIUM 8.3* 8.5* 8.7* 8.8* 8.8*  MG  --   --   --  1.6* 1.9  PHOS  --   --   --  3.1 3.4   Liver Function Tests: Recent Labs  Lab 02/03/21 0509 02/04/21 0429 02/05/21 0457   AST 16 17 14*  ALT 12 12 11   ALKPHOS 78 80 79  BILITOT 0.5 0.1* 0.3  PROT 5.5* 5.3* 5.4*  ALBUMIN 2.3* 2.2* 2.2*   No results for input(s): LIPASE, AMYLASE in the last 168 hours. No results for input(s): AMMONIA in the last 168 hours. CBC: Recent Labs  Lab 01/30/21 0210 02/01/21 0437 02/03/21 0509 02/04/21 0429 02/05/21 0457  WBC 5.5 7.3 9.8 11.0* 9.9  NEUTROABS  --   --  7.1 8.2* 7.2  HGB 11.2* 10.5* 10.7* 10.5* 10.3*  HCT 35.3* 34.0* 34.8* 33.6* 32.7*  MCV 86.3 88.5 88.5 87.3 87.4  PLT 323 381 384 473* 461*   Cardiac Enzymes: No results for input(s): CKTOTAL, CKMB, CKMBINDEX, TROPONINI in the last 168 hours. BNP: Invalid input(s): POCBNP CBG: Recent Labs  Lab 02/04/21 1202 02/04/21 1628 02/05/21 0528 02/05/21 0744 02/05/21 1217  GLUCAP 77 156* 127* 111* 124*   D-Dimer No results for input(s): DDIMER in the last 72 hours. Hgb A1c No results for input(s): HGBA1C in the last 72 hours. Lipid Profile No results for input(s): CHOL, HDL, LDLCALC, TRIG, CHOLHDL, LDLDIRECT in the last 72 hours. Thyroid function studies No results for input(s): TSH, T4TOTAL, T3FREE, THYROIDAB in the last 72 hours.  Invalid input(s): FREET3 Anemia work up No results for input(s): VITAMINB12, FOLATE, FERRITIN, TIBC, IRON, RETICCTPCT in the last 72 hours. Urinalysis No results found for: COLORURINE, APPEARANCEUR, [place], [place], GLUCOSEU, [place], [place], [place], PROTEINUR, UROBILINOGEN, NITRITE, LEUKOCYTESUR Sepsis Labs Invalid input(s): PROCALCITONIN,  WBC,  LACTICIDVEN Microbiology Recent Results (from the past 240 hour(s))  Resp Panel by RT-PCR (Flu A&B, Covid) Nasopharyngeal Swab     Status: None   Collection Time: 01/28/21  1:01 PM   Specimen: Nasopharyngeal Swab; Nasopharyngeal(NP) swabs in vial transport medium  Result Value Ref Range Status   SARS Coronavirus 2 by RT PCR NEGATIVE NEGATIVE Final    Comment: (NOTE) SARS-CoV-2 target nucleic acids are NOT  DETECTED.  The SARS-CoV-2 RNA is generally detectable in upper respiratory  specimens during the acute phase of infection. The lowest concentration of SARS-CoV-2 viral copies this assay can detect is 138 copies/mL. A negative result does not preclude SARS-Cov-2 infection and should not be used as the sole basis for treatment or other patient management decisions. A negative result may occur with  improper specimen collection/handling, submission of specimen other than nasopharyngeal swab, presence of viral mutation(s) within the areas targeted by this assay, and inadequate number of viral copies(<138 copies/mL). A negative result must be combined with clinical observations, patient history, and epidemiological information. The expected result is Negative.  Fact Sheet for Patients:  EntrepreneurPulse.com.au  Fact Sheet for Healthcare Providers:  IncredibleEmployment.be  This test is no t yet approved or cleared by the [place] FDA and  has been authorized for detection and/or diagnosis of SARS-CoV-2 by FDA under an Emergency Use Authorization (EUA). This EUA will remain  in effect (meaning this test can be used) for the duration of the COVID-19 declaration under Section 564(b)(1) of the Act, 21 U.S.C.section 360bbb-3(b)(1), unless the authorization is terminated  or revoked sooner.       Influenza A by PCR NEGATIVE NEGATIVE Final   Influenza B by PCR NEGATIVE NEGATIVE Final    Comment: (NOTE) The Xpert Xpress SARS-CoV-2/FLU/RSV plus assay is intended as an aid in the diagnosis of influenza from Nasopharyngeal swab specimens and should not be used as a sole basis for treatment. Nasal washings and aspirates are unacceptable for Xpert Xpress SARS-CoV-2/FLU/RSV testing.  Fact Sheet for Patients: EntrepreneurPulse.com.au  Fact Sheet for Healthcare Providers: IncredibleEmployment.be  This test is not yet  approved or cleared by the [place] FDA and has been authorized for detection and/or diagnosis of SARS-CoV-2 by FDA under an Emergency Use Authorization (EUA). This EUA will remain in effect (meaning this test can be used) for the duration of the COVID-19 declaration under Section 564(b)(1) of the Act, 21 U.S.C. section 360bbb-3(b)(1), unless the authorization is terminated or revoked.  Performed at [facility], [place] [address] [place], [place] [address]   MRSA Next Gen by PCR, Nasal     Status: None   Collection Time: 01/29/21  6:53 PM   Specimen: Nasal Mucosa; Nasal Swab  Result Value Ref Range Status   MRSA by PCR Next Gen NOT DETECTED NOT DETECTED Final    Comment: (NOTE) The GeneXpert MRSA Assay (FDA approved for NASAL specimens only), is one component of a comprehensive MRSA colonization surveillance program. It is not intended to diagnose MRSA infection nor to guide or monitor treatment for MRSA infections. Test performance is not FDA approved in patients less than 67 years old. Performed at [facility], [place] [address] [place], [place] [address]    Time coordinating discharge: 35 minutes  SIGNED:  Kerney Elbe, DO [facility] 02/05/2021, 6:59 PM Pager is on [place]  If 7PM-7AM, please contact night-coverage [email or web address]

## 2021-02-05 NOTE — Progress Notes (Signed)
Ambulated approximately 200 feet in the hallway without complaint of SOB. Oxygen saturation stayed on 97% to 98% on RA while ambulating with Heart rate up to 103.

## 2021-02-05 NOTE — TOC Transition Note (Signed)
Transition of Care [facility]) - CM/SW Discharge Note   Patient Details  Name: Ardelia Wrede MRN: [id number] Date of Birth: June 27, 1950  [facility]) CM/SW Contact:  Ross Ludwig, LCSW Phone Number: 02/05/2021, 3:51 PM   Clinical Narrative:     CSW was informed that patient needed some assistance with paying for one of her medications Vimpat.  CSW unable to provide match letter for her because she has insurance, however generic medication had a [employer] coupon.  CSW printed coupon for patient to use at a pharmacy.  No other needs, CSW signing off.        Patient Goals and CMS Choice        Discharge Placement                       Discharge Plan and Services                                     Social Determinants of Health (SDOH) Interventions     Readmission Risk Interventions No flowsheet data found.

## 2021-02-05 NOTE — Progress Notes (Signed)
It looks like Ms.Marulanda will be going home today.  The chest tube was taken out yesterday.  She ambulated.  She is maintaining her oxygen saturation.  She does not have any problems with pain.  She is eating a little bit better.  She is having no nausea or vomiting.  There is no bleeding.  Her labs show white cell count 9.9.  Hemoglobin 10.3.  Platelet count 461,000.  There is been no headache.  She is on steroids.  I cut the dose back a little bit of the Decadron.  She will have the radiosurgery next week.  If she does go home today, we will do her immunotherapy early next week.  Her vital signs show temperature 97.9.  Pulse 82.  Blood pressure 121/80.  Oxygen saturation on room air is 95%.  Her lungs show good air movement bilaterally.  She has no wheezes.  Cardiac exam regular rate and rhythm.  Abdomen is soft.  Bowel sounds are present.  Extremity shows no clubbing, cyanosis or edema.  Neurological exam is nonfocal.  Hopefully, Tami Stone will be able to go home today.  She has been hospitalized now for 10 days.  I know this is been tough on her.  She is on the [employer] and [place].  She will take this when she goes home.  She still wants to make sure she is on her complementary therapies.  We will see about the immunotherapy.  She is set up for the radiosurgery next Thursday.  I know the staff on [address] have really worked hard on her.  I very much appreciate all of their compassion and professional care.  Lattie [place], MD  Proverbs 12:28

## 2021-02-05 NOTE — Plan of Care (Signed)

## 2021-02-05 NOTE — Care Management Important Message (Signed)
Medicare IM printed for [employer] [employer] to give to the patient. 

## 2021-02-05 NOTE — Progress Notes (Signed)
Patient accompanied home by husband, transported to the car by staff. No distress noted.

## 2021-02-08 ENCOUNTER — Inpatient Hospital Stay: Payer: Medicare Other | Attending: Hematology & Oncology

## 2021-02-08 ENCOUNTER — Telehealth: Payer: Self-pay

## 2021-02-08 ENCOUNTER — Ambulatory Visit: Payer: Medicare Other

## 2021-02-08 ENCOUNTER — Encounter: Payer: Self-pay | Admitting: *Deleted

## 2021-02-08 ENCOUNTER — Inpatient Hospital Stay: Payer: Medicare Other | Admitting: Acute Care

## 2021-02-08 NOTE — Telephone Encounter (Signed)
Received VM from pt requesting refills on [place]. Pt states "I don't know where they need to go but they can't go to a regular pharmacy because of pt assistance."   Per Dennison Nancy, CPhT for oral oncology pt receives drug from [employer]. Prescriber application completed and faxed. dph

## 2021-02-08 NOTE — Progress Notes (Signed)
Patient has been discharged from the hospital. Per Dr Marin Olp, she needs to come into the office this week for her [place]. Called patient and she states that she is too busy to come in this week for treatment and that she will call back to schedule.   Dr Marin Olp notified.   Oncology Nurse Navigator Documentation  Oncology Nurse Navigator Flowsheets 02/08/2021  Abnormal Finding Date -  Confirmed Diagnosis Date -  Diagnosis Status -  Navigator Follow Up Date: 02/12/2021  Navigator Follow Up Reason: Appointment Review  Navigation Complete Date: -  Post Navigation: Continue to Follow Patient? 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

## 2021-02-10 DIAGNOSIS — Z510 Encounter for antineoplastic radiation therapy: Secondary | ICD-10-CM | POA: Diagnosis present

## 2021-02-10 DIAGNOSIS — Z170 Estrogen receptor positive status [ER+]: Secondary | ICD-10-CM | POA: Diagnosis not present

## 2021-02-10 DIAGNOSIS — C50512 Malignant neoplasm of lower-outer quadrant of left female breast: Secondary | ICD-10-CM | POA: Diagnosis not present

## 2021-02-10 DIAGNOSIS — C7931 Secondary malignant neoplasm of brain: Secondary | ICD-10-CM | POA: Insufficient documentation

## 2021-02-11 ENCOUNTER — Ambulatory Visit
Admission: RE | Admit: 2021-02-11 | Discharge: 2021-02-11 | Disposition: A | Discharge status: Home / Self Care | Payer: Medicare Other | Source: Ambulatory Visit | Attending: Radiation Oncology | Admitting: Radiation Oncology

## 2021-02-11 ENCOUNTER — Other Ambulatory Visit: Payer: Self-pay

## 2021-02-11 ENCOUNTER — Encounter: Payer: Self-pay | Admitting: Radiation Oncology

## 2021-02-11 DIAGNOSIS — C7931 Secondary malignant neoplasm of brain: Secondary | ICD-10-CM | POA: Diagnosis not present

## 2021-02-11 DIAGNOSIS — Z170 Estrogen receptor positive status [ER+]: Secondary | ICD-10-CM | POA: Insufficient documentation

## 2021-02-11 DIAGNOSIS — C50512 Malignant neoplasm of lower-outer quadrant of left female breast: Secondary | ICD-10-CM | POA: Insufficient documentation

## 2021-02-11 DIAGNOSIS — Z510 Encounter for antineoplastic radiation therapy: Secondary | ICD-10-CM | POA: Insufficient documentation

## 2021-02-11 NOTE — Progress Notes (Signed)
Tami Stone rested with Tami Stone for 30 minutes following [name].  Patient denies headache, dizziness, nausea, diplopia or ringing in the ears. Denies fatigue. Patient without complaints. Understands to avoid strenuous activity for the next 24 hours and call [phone] with needs.   BP 104/70   Pulse 99   Temp (!) 97.3 F (36.3 C)   Resp 18   SpO2 99%    Tami Stone M. Leonie Green, BSN

## 2021-02-11 NOTE — Progress Notes (Signed)
                                                                                                                                                             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  Intent: Palliative  Radiation Treatment Dates: 02/11/2021 through 02/11/2021 Site Technique Total Dose (Gy) Dose per Fx (Gy) Completed Fx Beam Energies  Brain:   PTV_1_LtCereb35mm PTV_2_RtCereb88mm  PTV_3_RtCereb60mm PTV_4_LtFront39mm PTV_5_RtFront53mm PTV_6_LtPariet48mm PTV_7_LtFront30mm  PTV_8_RtFront64mm  IMRT 20/20 20 1/1 6XFFF   Narrative: The patient tolerated radiation therapy relatively well.   Plan: The patient will receive a call in about one month from the radiation oncology department. She will continue follow up with Dr. Marin Olp as well as be followed in the brain oncology conference  ________________________________________________    Carola Rhine, PAC

## 2021-02-11 NOTE — Progress Notes (Signed)
  Radiation Oncology         [phone]) [phone] ________________________________  Name: Tami Stone  YOF:[id number]  Date of Service: 02/11/21  DOB: 1950/12/15   Steroid Taper Instructions   You currently have a prescription for Dexamethasone 4 mg Tablets.    Beginning 02/15/21: Take 1/2 of a tablet (which is 2 mg) twice a day  Beginning 02/22/21: Take 1/2 of a tablet (which is 2 mg) once a day  Beginning 03/01/21: Take 1/2 of a tablet (which is 2 mg) every other day and stop on 03/10/21.   Please call our office if you have any headaches, visual changes, uncontrolled movements, nausea or vomiting.

## 2021-02-11 NOTE — Op Note (Signed)
Name: Tami Stone    MRN: [id number]   Date: 02/11/2021    DOB: 26-Sep-1950   STEREOTACTIC RADIOSURGERY OPERATIVE NOTE  PRE-OPERATIVE DIAGNOSIS:  Metastatic brain disease  POST-OPERATIVE DIAGNOSIS:  Metastatic brain disease  PROCEDURE:   Stereotactic Radiosurgery using TrueBeam Linac device Stereotactic Radiosurgery using TrueBeam Linac device, add'l lesion x 7  SURGEON:  Duffy Rhody, MD  RADIATION ONCOLOGIST: Kyung Rudd, MD  TECHNIQUE:  The patient underwent a radiation treatment planning session in the radiation oncology simulation suite under the care of the radiation oncology physician and physicist.  I participated closely in the radiation treatment planning afterwards. The patient underwent planning CT which was fused to 3T high resolution MRI with 1 mm axial slices.  These images were fused on the planning system.  We contoured the 8 gross target volumes and subsequently expanded these by 1 mm to yield the Planning Target Volumes. I actively participated in the planning process.  I helped to define and review the target contours and also the contours of the optic pathway, eyes, brainstem and selected nearby organs at risk.  All the dose constraints for critical structures were reviewed and compared to AAPM Task Group 101.  The prescription dose conformity was reviewed.  I approved the plan electronically.    Accordingly, Vanna Scotland  was brought to the TrueBeam stereotactic radiation treatment linac and placed in the custom immobilization mask.  The patient was aligned according to the IR fiducial markers with BrainLab Exactrac, then orthogonal x-rays were used in ExacTrac with the 6DOF robotic table and the shifts were made to align the patient  Ozella Benecke received stereotactic radiosurgery to a prescription dose of 20 Gy uneventfully to each of the 8 lesions.  The detailed description of the procedure is recorded in the radiation oncology procedure note.  I was  present for the duration of the procedure.  DISPOSITION:   Following delivery, the patient was transported to nursing in stable condition and monitored for possible acute effects to be discharged to home in stable condition with follow-up in one month.  Duffy Rhody, MD [facility]

## 2021-02-16 ENCOUNTER — Encounter: Payer: Self-pay | Admitting: *Deleted

## 2021-02-16 ENCOUNTER — Encounter: Payer: Self-pay | Admitting: Internal Medicine

## 2021-02-16 NOTE — Progress Notes (Signed)
Patient has still not been scheduled for her hospital follow up appointment, including initiating her [facility] treatment. Scheduling has attempted but patient has stated she's not ready.  Called and spoke to patient. She states she feels too poorly to plan to come in. She doesn't want to schedule at this time. Explained to patient the importance of Dr Marin Olp seeing patient at regular intervals to try to help her in regards to symptom management, but also treatment. She understood, but still doesn't want to make appointment. I offered to call back to check on her, but she declined stating she would call to schedule when she was ready.  Message sent to schedulers about directions for scheduling when patient does call.   Oncology Nurse Navigator Documentation  Oncology Nurse Navigator Flowsheets 02/16/2021  Abnormal Finding Date -  Confirmed Diagnosis Date -  Diagnosis Status -  Navigator Follow Up Date: -  Navigator Follow Up Reason: -  Navigation Complete Date: -  Post Navigation: Continue to Follow Patient? 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

## 2021-02-17 ENCOUNTER — Other Ambulatory Visit: Payer: Self-pay | Admitting: *Deleted

## 2021-02-17 MED ORDER — ONDANSETRON HCL 8 MG PO TABS: 8.0000 mg | ORAL_TABLET | Freq: Three times a day (TID) | ORAL | 0 refills | Status: DC | PRN

## 2021-02-17 NOTE — Telephone Encounter (Signed)
Call received from patient requesting something for nausea.  Pt states that she has does not have Zofran 4 mg ODT at home that is currently on Epic med list. Dr Marin Olp notified and would like Zofran 8 mg sent in to pt.'s pharmacy.  Pt notified and would like new prescription sent to the [facility] on [address]. Pt appreciative of assistance and has no further questions at this time.

## 2021-02-22 ENCOUNTER — Encounter: Payer: Self-pay | Admitting: Pulmonary Disease

## 2021-02-22 ENCOUNTER — Other Ambulatory Visit: Payer: Self-pay

## 2021-02-22 ENCOUNTER — Ambulatory Visit (INDEPENDENT_AMBULATORY_CARE_PROVIDER_SITE_OTHER): Payer: Medicare Other | Admitting: Pulmonary Disease

## 2021-02-22 VITALS — BP 110/58 | HR 97 | Temp 98.4°F | Ht 62.0 in | Wt 123.2 lb

## 2021-02-22 DIAGNOSIS — C3492 Malignant neoplasm of unspecified part of left bronchus or lung: Secondary | ICD-10-CM | POA: Diagnosis not present

## 2021-02-22 DIAGNOSIS — J948 Other specified pleural conditions: Secondary | ICD-10-CM | POA: Diagnosis not present

## 2021-02-22 NOTE — Patient Instructions (Signed)
Nice to see you again  I am glad overall you are feeling better.  I am pleased to hear that your breathing feels better.  I think the weight gain and increase in energy is a great sign.  I do still feel a bit fatigued.  I am hopeful you will continue to feel better in the coming days to weeks.  Hopefully as the steroid washes out you will continue to feel well.  Please call us if we can help in any way, please let us know if the breathing changes for the worse so that we can respond and discuss options with you.  Return to clinic as needed

## 2021-02-22 NOTE — Progress Notes (Signed)
@Patient  ID: Tami Stone, female    DOB: 1951-01-09, 70 y.o.   MRN: [id number]  Chief Complaint  Patient presents with   Follow-up    Pt states she have been better since hospital not perfect but better.    Referring provider: Gaynelle Cage, MD  HPI:   70 y.o. woman with metastatic lung cancer whom we are seeing in follow-up for evaluation of left hydropneumothorax due to malignant pleural effusion and trapped lung.  Discharge summary reviewed.  Most recent pulmonary consult note while in the hospital reviewed.  Most recent radiation oncology note reviewed.  Chest tube was removed while in the hospital.  On my review of serial chest x-rays she had residual pneumothorax with chest tube in place.  Repeat chest x-ray after chest tube removal my interpretation demonstrates residual stable pneumothorax.  Breathing is stable.  Denies respiratory complaints.  No chest pain on that side.  Completed stereotactic radiation to the brain 02/11/2021.  On Decadron taper.  No complaints today.  Gaining weight, up about 8 pounds.  Encouraging sign responding to therapy hopefully.  Notes occasional case dehydrated.  Water taste bad to her.  She tries to flavor water with fruits and other vegetables.  She is accompanied by her husband to visit today.  Questionaires / Pulmonary Flowsheets:   ACT:  No flowsheet data found.  MMRC: No flowsheet data found.  Epworth:  No flowsheet data found.  Tests:   FENO:  No results found for: NITRICOXIDE  PFT: No flowsheet data found.  WALK:  No flowsheet data found.  Imaging: Personally reviewed and as per EMR and discussion of this note DG Chest 1 View  Result Date: 02/01/2021 CLINICAL DATA:  Left chest tube. EXAM: CHEST  1 VIEW COMPARISON:  01/31/2021 and CT chest 01/19/2021. FINDINGS: Trachea is midline. Heart size stable. Persistent left lower lobe collapse/consolidation. Small to moderate left hydropneumothorax with a small bore drain in place, as  before. Associated subcutaneous emphysema in the left axillary region. Right lung is clear. IMPRESSION: 1. Small to moderate left hydropneumothorax with drain in place, unchanged. 2. Persistent left lower lobe collapse/consolidation, possibly representing pneumonia. Electronically Signed   By: Lorin Picket M.D.   On: 02/01/2021 11:23   CT HEAD WO CONTRAST (5MM)  Result Date: 01/28/2021 CLINICAL DATA:  Seizure EXAM: CT HEAD WITHOUT CONTRAST TECHNIQUE: Contiguous axial images were obtained from the base of the skull through the vertex without intravenous contrast. COMPARISON:  Brain MRI 05/19/2020 FINDINGS: Brain: There is ill-defined hypodensity in the left frontal lobe subcortical white matter along the orbital gyrus (2-10, 4-15, 5-36). There is no evidence of acute intracranial hemorrhage or territorial infarct. The ventricles are not enlarged. There is no midline shift. Vascular: No hyperdense vessel or unexpected calcification. Skull: Normal. Negative for fracture or focal lesion. Sinuses/Orbits: The imaged paranasal sinuses are clear. The imaged globes and orbits are unremarkable. Other: The mastoid air cells are clear. IMPRESSION: Ill-defined enhancement in the left frontal lobe as above. 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 Electronically Signed   By: Valetta Mole M.D.   On: 01/28/2021 14:26   MR BRAIN W WO CONTRAST  Result Date: 02/03/2021 CLINICAL DATA:  Brain/CNS neoplasm, staging. Metastatic lung cancer. EXAM: MRI HEAD WITHOUT AND WITH CONTRAST TECHNIQUE: Multiplanar, multiecho pulse sequences of the brain and surrounding structures were obtained without and with intravenous contrast. CONTRAST:  5.64mL GADAVIST GADOBUTROL 1  MMOL/ML IV SOLN COMPARISON:  01/28/2021 FINDINGS: Brain: There are a total of 8 subcentimeter enhancing brain lesions, some of which are more conspicuous and/or slightly  larger on today's 3 T MRI compared to the prior 1.5 T study. The lesions are annotated on series 1100 and are located as follows: left cerebellum (image 136), right cerebellum (image 146), right cerebellum (image 157), left frontal lobe (image 190), right frontal lobe (image 218), left parietal lobe (image 220), left frontal lobe (image 227), and right frontal lobe (image 231). Mild edema associated with several lesions is unchanged, greatest about the 5 mm left frontal lesion (image 190). A few foci of vascular enhancement are noted including in the right (image 211) and left parietal regions (image 206) and medial right cerebellum (image 138). No acute infarct, intracranial hemorrhage, midline shift, or extra-axial fluid collection is evident. Scattered small T2 hyperintensities in the cerebral white matter and pons are nonspecific but compatible with mild chronic small vessel ischemic disease. There is mild cerebral atrophy. A tiny chronic right cerebellar infarct is noted. Vascular: Major intracranial vascular flow voids are preserved. Skull and upper cervical spine: Unremarkable bone marrow signal. Sinuses/Orbits: Unremarkable orbits. Minimal mucosal thickening in the ethmoid sinuses. Clear mastoid air cells. Other: None. IMPRESSION: 1. 8 subcentimeter brain metastases with up to mild edema. 2. Mild chronic small vessel ischemic disease. Electronically Signed   By: Logan Bores M.D.   On: 02/03/2021 10:33   MR Brain W and Wo Contrast  Result Date: 01/28/2021 CLINICAL DATA:  Seizure, nontraumatic (Age >= 27y). EXAM: MRI HEAD WITHOUT AND WITH CONTRAST TECHNIQUE: Multiplanar, multiecho pulse sequences of the brain and surrounding structures were obtained without and with intravenous contrast. CONTRAST:  65mL GADAVIST GADOBUTROL 1 MMOL/ML IV SOLN COMPARISON:  Head CT 01/28/2021. FINDINGS: Brain: Cerebral volume is normal. There are multiple enhancing intracranial lesions compatible with intracranial metastatic  disease, as follows. 4 mm enhancing lesion within the right frontal lobe with mild surrounding edema (series 16, image 96). 3 mm enhancing lesion within the posterior left frontal lobe with mild surrounding edema (series 16, image 98). 6 mm enhancing lesion within the anterior left frontal lobe with mild surrounding edema (series 16, image 67). 2 mm enhancing lesion within the left parietal lobe with mild surrounding edema (series 16, image 91). 2 mm enhancing lesion within the right cerebellar hemisphere (series 16, image 29). 2 mm enhancing lesion within the left cerebellar hemisphere (series 16, image 18). Mild multifocal T2/FLAIR hyperintensity elsewhere within the cerebral white matter and pons is nonspecific, but compatible chronic small vessel ischemic disease. There is a punctate focus of apparent diffusion weighted hyperintensity within the right cerebellum appreciated on the axial diffusion-weighted imaging only (series 5, image 55). No chronic intracranial blood products. No extra-axial fluid collection. No midline shift. Vascular: Maintained flow voids within the proximal large arterial vessels. Skull and upper cervical spine: No focal suspicious marrow lesion. Sinuses/Orbits: Visualized orbits show no acute finding. Trace bilateral ethmoid sinus mucosal thickening. IMPRESSION: At least six enhancing metastatic lesions are identified within the supratentorial and infratentorial brain, measuring up to 4 mm, some with mild surrounding edema, as detailed. Punctate focus of diffusion weighted signal abnormality within the medial right cerebellum, appreciated on the axial diffusion-weighted sequence only. This may reflect an acute infarct, a poorly enhancing metastasis or artifact. Background mild chronic small vessel ischemic changes within the cerebral white matter and pons. Electronically Signed   By: Kellie Simmering D.O.   On: 01/28/2021 18:03  DG CHEST PORT 1 VIEW  Result Date: 02/05/2021 CLINICAL  DATA:  70 year old female with shortness of breath. Metastatic lung cancer, recurrent pleural effusion. EXAM: PORTABLE CHEST 1 VIEW COMPARISON:  Portable chest 02/04/2021 and earlier. FINDINGS: Portable AP semi upright view at 0353 hours. Stable mediastinal contour. Lower lung volumes. Veiling opacity in the left medial and lower lung compatible with residual pleural effusion. And there is evidence that pleural fluid now occupies the apical pleural space where air was seen previously. A small volume of left axillary subcutaneous gas is stable. Left pleural drain was in place earlier this month. No superimposed pneumothorax, pulmonary edema, or consolidation. Mild crowding of markings in the right lung. No acute osseous abnormality identified. IMPRESSION: Lower lung volumes. Left pleural effusion persists but no pleural air seen now. No new cardiopulmonary abnormality. Electronically Signed   By: Genevie Ann M.D.   On: 02/05/2021 04:21   DG CHEST PORT 1 VIEW  Result Date: 02/04/2021 CLINICAL DATA:  Left chest tube removal EXAM: PORTABLE CHEST 1 VIEW COMPARISON:  02/01/2021 FINDINGS: Single frontal view of the chest demonstrates interval removal of the left chest tube. There is a persistent left-sided hydropneumothorax. Gas component at the left apex is unchanged since prior study. There has been increase in opacity at the left lung base compatible with enlarging fluid component and underlying lung consolidation. Right chest is clear. Cardiac silhouette is unremarkable. No acute bony abnormalities. Minimal residual gas within the soft tissues of the left chest wall. IMPRESSION: 1. Persistent left-sided hydropneumothorax. Stable gas component since previous examination. Increasing opacity at the left lung base likely representing progressive left lung consolidation and enlarging fluid component of the hydropneumothorax. These results will be called to the ordering clinician or representative by the Radiologist  Assistant, and communication documented in the PACS or [employer]. Electronically Signed   By: Randa Ngo M.D.   On: 02/04/2021 16:22   DG CHEST PORT 1 VIEW  Result Date: 01/31/2021 CLINICAL DATA:  Lt sided chest tube EXAM: PORTABLE CHEST - 1 VIEW COMPARISON:  the previous day's study FINDINGS: Left pigtail chest tube stable. Persistent left apical pneumothorax, lung apex projecting inferior to the posterior aspect left third rib as before. Consolidation/atelectasis in the left lower lung. Heart size and mediastinal contours are within normal limits. Possible layering left pleural effusion.  Right lung clear. Visualized bones unremarkable. IMPRESSION: 1. Stable left chest tube with persistent apical pneumothorax. 2. Persistent left lower lung consolidation/atelectasis with possible effusion. Electronically Signed   By: Lucrezia Europe M.D.   On: 01/31/2021 12:12   DG CHEST PORT 1 VIEW  Result Date: 01/30/2021 CLINICAL DATA:  Diabetes, hydropneumothorax EXAM: PORTABLE CHEST 1 VIEW COMPARISON:  01/29/2021 FINDINGS: Left basilar pigtail chest tube is unchanged in position. Pulmonary insufflation is stable. Mild left-sided volume loss is unchanged. Opacification of the left lung base likely relates to residual pleural fluid, similar to that noted on prior examination. Small apical pneumothorax is unchanged. Subcutaneous gas is again seen within the left chest wall. The right lung is clear. No pneumothorax or pleural effusion on the right. Cardiac size is within normal limits. Retrocardiac opacity is unchanged. IMPRESSION: Stable small left hydropneumothorax. Left basilar chest tube is unchanged. Unchanged retrocardiac opacity corresponding to the known mass seen on recent CT examination. Stable pulmonary insufflation. Electronically Signed   By: Fidela Salisbury M.D.   On: 01/30/2021 00:27   DG CHEST PORT 1 VIEW  Result Date: 01/29/2021 CLINICAL DATA:  Chest tube with hydropneumothorax  EXAM: PORTABLE  CHEST 1 VIEW COMPARISON:  01/28/2021 FINDINGS: Left chest tube in place. Left apical pneumothorax reaching the fourth posterior rib. Soft tissue emphysema. Hazy density with probable fissural component at the left base is unchanged. Known left lower lobe mass. Clear right lung and stable heart size. IMPRESSION: Unchanged left hydropneumothorax and lower lobe opacification. Electronically Signed   By: Monte Fantasia M.D.   On: 01/29/2021 05:45   DG CHEST PORT 1 VIEW  Result Date: 01/28/2021 CLINICAL DATA:  Status post chest tube placement EXAM: PORTABLE CHEST 1 VIEW COMPARISON:  Film from earlier in the same day. FINDINGS: Pigtail catheter is now seen on the left with significant reduction in pleural effusion. The lung is incompletely re-expanded with both an apical and inferolateral pneumothorax component. These changes are likely related to incomplete re-expansion of the lung as opposed to a true pneumothorax. This is related to the longstanding pleural effusion. Right lung is clear. Cardiac shadow is within normal limits. IMPRESSION: Status post chest tube placement with significant reduction in left-sided pleural effusion. There remains left lung consolidation and incomplete re-expansion as described related to the longstanding nature of the effusion as opposed to a true pneumothorax. Follow-up films are recommended. Electronically Signed   By: Inez Catalina M.D.   On: 01/28/2021 17:01   DG Chest Portable 1 View  Addendum Date: 01/28/2021   ADDENDUM REPORT: 01/28/2021 13:43 ADDENDUM: Critical Value/emergent results were called by telephone at the time of interpretation on 01/28/2021 at 1:36 pm to provider Davonna Belling , who verbally acknowledged these results. Electronically Signed   By: Marijo Conception M.D.   On: 01/28/2021 13:43   Result Date: 01/28/2021 CLINICAL DATA:  Syncope. EXAM: PORTABLE CHEST 1 VIEW COMPARISON:  January 21, 2021. FINDINGS: Stable cardiomediastinal silhouette. Right lung is  clear. Left apical pneumothorax is noted with large pleural effusion. Bony thorax is unremarkable. IMPRESSION: Large left-sided hydropneumothorax is noted. Electronically Signed: By: Marijo Conception M.D. On: 01/28/2021 13:36   EEG adult  Result Date: 01/29/2021 Lora Havens, MD     01/29/2021 12:04 PM Patient Name: Tami Stone MRN: [id number] Epilepsy Attending: Lora Havens Referring Physician/Provider: Dr Cherylann Ratel Date: 01/29/2021 Duration: 25 mins Patient history: 70yo F who presented with syncope. 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 EEG to evaluate for seizure. Level of alertness: Awake, drowsy AEDs during EEG study: LEV Technical aspects: This EEG study was done with scalp electrodes positioned according to the 10-20 International system of electrode placement. Electrical activity was acquired at a sampling rate of 500Hz  and reviewed with a high frequency filter of 70Hz  and a low frequency filter of 1Hz . EEG data were recorded continuously and digitally stored. Description: The posterior dominant rhythm consists of 9 Hz activity of moderate voltage (25-35 uV) seen predominantly in posterior head regions, symmetric and reactive to eye opening and eye closing. Drowsiness was characterized by attenuation of the posterior background rhythm. EEG showed intermittent generalized 3 to 6 Hz theta-delta slowing. Hyperventilation and photic stimulation were not performed.   ABNORMALITY - Intermittent slow, generalized IMPRESSION: This study is suggestive of mild diffuse encephalopathy, nonspecific etiology. No seizures or epileptiform discharges were seen throughout the recording. Lora Havens    Lab Results: Personally reviewed as per EMR CBC    Component Value Date/Time   WBC 9.9 02/05/2021 0457   RBC 3.74 (  L) 02/05/2021 0457   HGB 10.3 (L)  02/05/2021 0457   HGB 11.2 (L) 12/17/2020 1114   HCT 32.7 (L) 02/05/2021 0457   PLT 461 (H) 02/05/2021 0457   PLT 328 12/17/2020 1114   MCV 87.4 02/05/2021 0457   MCH 27.5 02/05/2021 0457   MCHC 31.5 02/05/2021 0457   RDW 15.3 02/05/2021 0457   LYMPHSABS 1.8 02/05/2021 0457   MONOABS 0.6 02/05/2021 0457   EOSABS 0.2 02/05/2021 0457   BASOSABS 0.0 02/05/2021 0457    BMET    Component Value Date/Time   NA 136 02/05/2021 0457   K 4.4 02/05/2021 0457   CL 100 02/05/2021 0457   CO2 29 02/05/2021 0457   GLUCOSE 123 (H) 02/05/2021 0457   BUN 17 02/05/2021 0457   CREATININE 0.57 02/05/2021 0457   CREATININE 0.61 12/17/2020 1114   CALCIUM 8.8 (L) 02/05/2021 0457   GFRNONAA >60 02/05/2021 0457   GFRNONAA >60 12/17/2020 1114    BNP    Component Value Date/Time   BNP 16.6 01/18/2021 1400    ProBNP No results found for: PROBNP  Specialty Problems       Pulmonary Problems   Mass of lower lobe of left lung   Adenocarcinoma of left lung, stage 4 (HCC)   Pleural effusion on left   Hydropneumothorax    No Known Allergies   There is no immunization history on file for this patient.  Past Medical History:  Diagnosis Date   Diabetes ([place])    High cholesterol    Lung cancer ([place])    04/2020    Tobacco History: Social History   Tobacco Use  Smoking Status Never  Smokeless Tobacco Never   Counseling given: Not Answered   Continue to not smoke  Outpatient Encounter Medications as of 02/22/2021  Medication Sig   acetaminophen (TYLENOL) 325 MG tablet Take 2 tablets (650 mg total) by mouth every 6 (six) hours as needed for mild pain, moderate pain or fever.   Coenzyme Q10 (CO Q 10 PO) Take 300 mg by mouth at bedtime.   dabrafenib mesylate (TAFLINAR) 50 MG capsule Take 100 mg by mouth 2 (two) times daily. Take on an empty stomach 1 hour before or 2 hours after meals.   DANDELION PO Take 1 capsule by mouth daily.   dexamethasone (DECADRON) 4 MG tablet Take 1 tablet  (4 mg total) by mouth every 12 (twelve) hours.   diphenhydrAMINE-zinc acetate (BENADRYL) cream Apply topically as needed for itching (as needed for itching as labeled).   dronabinol (MARINOL) 5 MG capsule Take 1 capsule (5 mg total) by mouth 2 (two) times daily before a meal.   feeding supplement (ENSURE ENLIVE / ENSURE PLUS) LIQD Take 237 mLs by mouth 3 (three) times daily between meals.   ibuprofen (ADVIL) 200 MG tablet Take 400 mg by mouth every 6 (six) hours as needed for mild pain.   IVERMECTIN PO Take 1.8 mLs by mouth daily. Ivermectin oil - 10 mg/ml   lacosamide (VIMPAT) 50 MG TABS tablet Take 1 tablet (50 mg total) by mouth 2 (two) times daily.   letrozole (FEMARA) 2.5 MG tablet Take 2.5 mg by mouth daily.   metFORMIN (GLUCOPHAGE-XR) 750 MG 24 hr tablet Take 750 mg by mouth 2 (two) times daily.   methocarbamol (ROBAXIN) 500 MG tablet Take 1 tablet (500 mg total) by mouth every 6 (six) hours as needed for muscle spasms.   Misc Natural Products (MAGIC MUSHROOM MIX) CAPS Take 1 tablet by  mouth daily.   Multiple Vitamins-Minerals (MULTIVITAMIN WITH MINERALS) tablet Take 1 tablet by mouth daily.   NP THYROID 60 MG tablet Take 60 mg by mouth daily.   ondansetron (ZOFRAN) 8 MG tablet Take 1 tablet (8 mg total) by mouth every 8 (eight) hours as needed for nausea or vomiting.   OVER THE COUNTER MEDICATION Take 2 capsules by mouth 3 (three) times daily. Protectival   polyethylene glycol (MIRALAX / GLYCOLAX) 17 g packet Take 17 g by mouth daily. (Patient taking differently: Take 17 g by mouth daily as needed for moderate constipation.)   senna (SENOKOT) 8.6 MG TABS tablet Take 1 tablet (8.6 mg total) by mouth daily.   trametinib dimethyl sulfoxide (MEKINIST) 2 MG tablet Take 1 tablet (2 mg total) by mouth daily. Take 1 hour before or 2 hours after a meal. Store refrigerated in original container.   Zinc 100 MG TABS Take 100 mg by mouth daily.   No facility-administered encounter medications on file  as of 02/22/2021.     Review of Systems  Review of Systems  N/A Physical Exam  BP (!) 110/58 (BP Location: Right Arm, Patient Position: Sitting, Cuff Size: Normal)   Pulse 97   Temp 98.4 F (36.9 C) (Oral)   Ht 5\' 2"  (1.575 m)   Wt 123 lb 3.2 oz (55.9 kg)   SpO2 98%   BMI 22.53 kg/m   Wt Readings from Last 5 Encounters:  02/22/21 123 lb 3.2 oz (55.9 kg)  02/05/21 120 lb 6.4 oz (54.6 kg)  01/18/21 132 lb 15 oz (60.3 kg)  12/17/20 133 lb (60.3 kg)  06/02/20 155 lb 1.6 oz (70.4 kg)    BMI Readings from Last 5 Encounters:  02/22/21 22.53 kg/m  02/05/21 22.02 kg/m  01/18/21 24.31 kg/m  12/17/20 24.33 kg/m  06/02/20 28.37 kg/m     Physical Exam General: Well-appearing, no acute distress Eyes: EOMI, no icterus Neck: Supple, no JVP Pulmonary: Diminished left base, otherwise clear, no work of breathing Cardiovascular: Regular rate and rhythm, no murmur Neuro: Normal gait, no weakness    Assessment & Plan:   Malignant pleural effusion with trapped lung: Presented to [facility]21 with hydropneumothorax.  Residual pneumothorax present after chest tube removal.  Trapped lung present.  Due to malignancy.  We have discussed in the past and continues to endorse her wish to not have indwelling catheter or Pleurx placed.  Fortunately, her history symptoms seem stable to improved.  Repeat imaging as needed with thoracentesis as needed.  Notably, high risk for residual pneumothorax after thoracentesis given evidence of trapped lung.  Lung cancer: Under the care of Dr. Marin Olp.  She seems to be improving on therapy, gaining weight.  This is all encouraging.  Completed stereotactic brain radiation 02/11/2021 with ongoing Decadron taper.   Return if symptoms worsen or fail to improve.   Lanier Clam, MD 02/22/2021

## 2021-03-03 ENCOUNTER — Encounter: Payer: Self-pay | Admitting: Hematology & Oncology

## 2021-03-03 ENCOUNTER — Inpatient Hospital Stay (HOSPITAL_BASED_OUTPATIENT_CLINIC_OR_DEPARTMENT_OTHER): Payer: Medicare Other | Admitting: Hematology & Oncology

## 2021-03-03 ENCOUNTER — Encounter: Payer: Self-pay | Admitting: *Deleted

## 2021-03-03 ENCOUNTER — Inpatient Hospital Stay: Payer: Medicare Other | Attending: Hematology & Oncology

## 2021-03-03 ENCOUNTER — Other Ambulatory Visit: Payer: Self-pay

## 2021-03-03 VITALS — BP 111/63 | HR 104 | Temp 99.1°F | Resp 18 | Ht 62.0 in | Wt 127.0 lb

## 2021-03-03 DIAGNOSIS — C782 Secondary malignant neoplasm of pleura: Secondary | ICD-10-CM | POA: Insufficient documentation

## 2021-03-03 DIAGNOSIS — C50912 Malignant neoplasm of unspecified site of left female breast: Secondary | ICD-10-CM | POA: Diagnosis not present

## 2021-03-03 DIAGNOSIS — Z170 Estrogen receptor positive status [ER+]: Secondary | ICD-10-CM

## 2021-03-03 DIAGNOSIS — C7951 Secondary malignant neoplasm of bone: Secondary | ICD-10-CM | POA: Diagnosis not present

## 2021-03-03 DIAGNOSIS — I313 Pericardial effusion (noninflammatory): Secondary | ICD-10-CM

## 2021-03-03 DIAGNOSIS — I3139 Other pericardial effusion (noninflammatory): Secondary | ICD-10-CM

## 2021-03-03 DIAGNOSIS — C3492 Malignant neoplasm of unspecified part of left bronchus or lung: Secondary | ICD-10-CM

## 2021-03-03 DIAGNOSIS — Z7984 Long term (current) use of oral hypoglycemic drugs: Secondary | ICD-10-CM | POA: Diagnosis not present

## 2021-03-03 DIAGNOSIS — Z79811 Long term (current) use of aromatase inhibitors: Secondary | ICD-10-CM | POA: Diagnosis not present

## 2021-03-03 DIAGNOSIS — C7931 Secondary malignant neoplasm of brain: Secondary | ICD-10-CM | POA: Diagnosis not present

## 2021-03-03 DIAGNOSIS — Z79899 Other long term (current) drug therapy: Secondary | ICD-10-CM | POA: Insufficient documentation

## 2021-03-03 DIAGNOSIS — C50512 Malignant neoplasm of lower-outer quadrant of left female breast: Secondary | ICD-10-CM | POA: Diagnosis not present

## 2021-03-03 LAB — CBC WITH DIFFERENTIAL (CANCER CENTER ONLY)
Abs Immature Granulocytes: 0.05 10*3/uL (ref 0.00–0.07)
Basophils Absolute: 0 10*3/uL (ref 0.0–0.1)
Basophils Relative: 0 %
Eosinophils Absolute: 0.2 10*3/uL (ref 0.0–0.5)
Eosinophils Relative: 3 %
HCT: 33.4 % — ABNORMAL LOW (ref 36.0–46.0)
Hemoglobin: 10.5 g/dL — ABNORMAL LOW (ref 12.0–15.0)
Immature Granulocytes: 1 %
Lymphocytes Relative: 19 %
Lymphs Abs: 1.5 10*3/uL (ref 0.7–4.0)
MCH: 27.3 pg (ref 26.0–34.0)
MCHC: 31.4 g/dL (ref 30.0–36.0)
MCV: 87 fL (ref 80.0–100.0)
Monocytes Absolute: 0.6 10*3/uL (ref 0.1–1.0)
Monocytes Relative: 7 %
Neutro Abs: 5.6 10*3/uL (ref 1.7–7.7)
Neutrophils Relative %: 70 %
Platelet Count: 401 10*3/uL — ABNORMAL HIGH (ref 150–400)
RBC: 3.84 MIL/uL — ABNORMAL LOW (ref 3.87–5.11)
RDW: 17 % — ABNORMAL HIGH (ref 11.5–15.5)
WBC Count: 8 10*3/uL (ref 4.0–10.5)
nRBC: 0 % (ref 0.0–0.2)

## 2021-03-03 LAB — CMP (CANCER CENTER ONLY)
ALT: 8 U/L (ref 0–44)
AST: 13 U/L — ABNORMAL LOW (ref 15–41)
Albumin: 3.3 g/dL — ABNORMAL LOW (ref 3.5–5.0)
Alkaline Phosphatase: 94 U/L (ref 38–126)
Anion gap: 8 (ref 5–15)
BUN: 17 mg/dL (ref 8–23)
CO2: 29 mmol/L (ref 22–32)
Calcium: 9 mg/dL (ref 8.9–10.3)
Chloride: 97 mmol/L — ABNORMAL LOW (ref 98–111)
Creatinine: 0.64 mg/dL (ref 0.44–1.00)
GFR, Estimated: >60 mL/min (ref >60)
Glucose, Bld: 165 mg/dL — ABNORMAL HIGH (ref 70–99)
Potassium: 3.7 mmol/L (ref 3.5–5.1)
Sodium: 134 mmol/L — ABNORMAL LOW (ref 135–145)
Total Bilirubin: 0.2 mg/dL — ABNORMAL LOW (ref 0.3–1.2)
Total Protein: 6.7 g/dL (ref 6.5–8.1)

## 2021-03-03 LAB — LACTATE DEHYDROGENASE: LDH: 165 U/L (ref 98–192)

## 2021-03-03 MED ORDER — METFORMIN HCL 500 MG PO TABS: 500.0000 mg | ORAL_TABLET | Freq: Three times a day (TID) | ORAL | 5 refills | Status: DC

## 2021-03-03 NOTE — Progress Notes (Signed)
Patient following yo after her hospitalization. She is due to start [place], but has declined this and states she continues her oral chemo, and holistic infusional therapies in [place].   Oncology Nurse Navigator Documentation  Oncology Nurse Navigator Flowsheets 03/03/2021  Abnormal Finding Date -  Confirmed Diagnosis Date -  Diagnosis Status -  Navigator Follow Up Date: 04/14/2021  Navigator Follow Up Reason: Follow-up Appointment  Navigation Complete Date: -  Post Navigation: Continue to Follow Patient? -  Reason Not Navigating Patient: -  Navigator Location [facility]  Referral Date to RadOnc/MedOnc -  Navigator Encounter Type Follow-up Appt;Appt/Treatment Plan Review  Telephone -  Multidisiplinary Clinic Date -  Multidisiplinary Clinic Type -  Patient Visit Type MedOnc  Treatment Phase Active Tx  Barriers/Navigation Needs Coordination of Care;Education  Education -  Interventions None Required  Acuity Level 2-Minimal Needs (1-2 Barriers Identified)  Coordination of Care -  Education Method -  Support Groups/Services Friends and Family  Time Spent with Patient 15

## 2021-03-03 NOTE — Progress Notes (Signed)
Hematology and Oncology Follow Up Visit  Tami Stone [id number] 1951-02-11 70 y.o. 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  We initially saw her in the office back in July.  Since then, she has been in the hospital a couple times.  She has developed a malignant left pleural effusion.  She did have a chest tube in.  This is able to be removed.  She has a residual Hydro pneumothorax.  She also developed brain metastasis.  She underwent SBRT for the brain metastasis.  This was completed on 02/11/2021.  We thought that we would try her on Keytruda because her tumor is a high level of PD-L1.  However, she does not feel confident taking the Keytruda.  She would much rather prefer to go with a more natural immunotherapy approach.  As such, she is getting an infusion from a physician in [place].  She is not sure with the name of this medication is.  She says she will let me know.  She says she is taking the Mekinist and the Tafinlar.  I would like to believe that this is going to help her.  She is also on other supplements.  She is on metformin which she feels is helping her malignancy.  She has noticed a lump in the left breast.  This is at about the 5 o'clock position.  It probably measures about 5 x 8 mm.  It is firm.  It is mobile.  I just worry that this is her breast cancer.  Again, she does not wish to have this biopsied as of yet.  She we will stop her steroid taper now.  I really think she can stop the steroid taper.  She has a decent appetite.  She has had no nausea or vomiting.  She has had no bleeding.  She does have a cough.  It is nonproductive.  There is no problems  with diarrhea.  There is been no leg swelling.  I must say that she certainly does look quite good given all that is going on with her.  Her performance status right now is ECOG 1.  Medications:  Current Outpatient Medications:    acetaminophen (TYLENOL) 325 MG tablet, Take 2 tablets (650 mg total) by mouth every 6 (six) hours as needed for mild pain, moderate pain or fever., Disp: 20 tablet, Rfl: 0   Coenzyme Q10 (CO Q 10 PO), Take 300 mg by mouth at bedtime., Disp: , Rfl:    dabrafenib mesylate (TAFLINAR) 50 MG capsule, Take 100 mg by mouth 2 (two) times daily. Take on an empty stomach 1 hour before or 2 hours after meals., Disp: , Rfl:    DANDELION PO, Take 1 capsule by mouth daily., Disp: , Rfl:    diphenhydrAMINE-zinc acetate (BENADRYL) cream, Apply topically as needed for itching (as needed for itching as labeled)., Disp: 28.4 g, Rfl: 0   dronabinol (MARINOL) 5 MG capsule, Take 1 capsule (5 mg total) by mouth 2 (two) times daily before a meal., Disp: 60 capsule, Rfl: 2   feeding supplement (ENSURE ENLIVE / ENSURE PLUS) LIQD, Take 237 mLs by mouth 3 (three) times daily between meals., Disp: 237 mL, Rfl: 12  ibuprofen (ADVIL) 200 MG tablet, Take 400 mg by mouth every 6 (six) hours as needed for mild pain., Disp: , Rfl:    IVERMECTIN PO, Take 1.8 mLs by mouth daily. Ivermectin oil - 10 mg/ml, Disp: , Rfl:    letrozole (FEMARA) 2.5 MG tablet, Take 2.5 mg by mouth daily., Disp: , Rfl:    metFORMIN (GLUCOPHAGE) 500 MG tablet, Take 1 tablet (500 mg total) by mouth 3 (three) times daily., Disp: 90 tablet, Rfl: 5   methocarbamol (ROBAXIN) 500 MG tablet, Take 1 tablet (500 mg total) by mouth every 6 (six) hours as needed for muscle spasms., Disp: 20 tablet, Rfl: 0   Misc Natural Products (MAGIC MUSHROOM MIX) CAPS, Take 1 tablet by mouth daily., Disp: , Rfl:    Multiple Vitamins-Minerals (MULTIVITAMIN WITH MINERALS) tablet, Take 1 tablet by mouth daily., Disp: , Rfl:    NP THYROID 60 MG tablet,  Take 60 mg by mouth daily., Disp: , Rfl:    ondansetron (ZOFRAN) 8 MG tablet, Take 1 tablet (8 mg total) by mouth every 8 (eight) hours as needed for nausea or vomiting., Disp: 20 tablet, Rfl: 0   OVER THE COUNTER MEDICATION, Take 2 capsules by mouth 3 (three) times daily. Protectival, Disp: , Rfl:    polyethylene glycol (MIRALAX / GLYCOLAX) 17 g packet, Take 17 g by mouth daily. (Patient taking differently: Take 17 g by mouth daily as needed for moderate constipation.), Disp: 14 each, Rfl: 0   senna (SENOKOT) 8.6 MG TABS tablet, Take 1 tablet (8.6 mg total) by mouth daily., Disp: 120 tablet, Rfl: 0   trametinib dimethyl sulfoxide (MEKINIST) 2 MG tablet, Take 1 tablet (2 mg total) by mouth daily. Take 1 hour before or 2 hours after a meal. Store refrigerated in original container., Disp: 30 tablet, Rfl: 3   Zinc 100 MG TABS, Take 100 mg by mouth daily., Disp: , Rfl:   Allergies: No Known Allergies  Past Medical History, Surgical history, Social history, and Family History were reviewed and updated.  Review of Systems: Review of Systems  Constitutional:  Positive for fatigue.  HENT:  Negative.    Eyes: Negative.   Respiratory:  Positive for cough.   Cardiovascular: Negative.   Gastrointestinal: Negative.   Endocrine: Positive for hot flashes.  Genitourinary: Negative.    Musculoskeletal:  Positive for arthralgias and myalgias.  Skin: Negative.   Neurological:  Positive for dizziness and light-headedness.  Hematological: Negative.   Psychiatric/Behavioral: Negative.     Physical Exam:  height is 5' 2"  (1.575 m) and weight is 127 lb (57.6 kg). Her oral temperature is 99.1 F (37.3 C). Her blood pressure is 111/63 and her pulse is 104 (abnormal). Her respiration is 18 and oxygen saturation is 100%.   Wt Readings from Last 3 Encounters:  03/03/21 127 lb (57.6 kg)  02/22/21 123 lb 3.2 oz (55.9 kg)  02/05/21 120 lb 6.4 oz (54.6 kg)    Physical Exam Vitals reviewed.  HENT:     Head:  Normocephalic and atraumatic.  Eyes:     Pupils: Pupils are equal, round, and reactive to light.  Cardiovascular:     Rate and Rhythm: Normal rate and regular rhythm.     Heart sounds: Normal heart sounds.  Pulmonary:     Effort: Pulmonary effort is normal.     Breath sounds: Normal breath sounds.  Abdominal:     General: Bowel sounds are normal.     Palpations: Abdomen is soft.  Musculoskeletal:  General: No tenderness or deformity. Normal range of motion.     Cervical back: Normal range of motion.  Lymphadenopathy:     Cervical: No cervical adenopathy.  Skin:    General: Skin is warm and dry.     Findings: No erythema or rash.  Neurological:     Mental Status: She is alert and oriented to person, place, and time.  Psychiatric:        Behavior: Behavior normal.        Thought Content: Thought content normal.        Judgment: Judgment normal.     Lab Results  Component Value Date   WBC 8.0 03/03/2021   HGB 10.5 (L) 03/03/2021   HCT 33.4 (L) 03/03/2021   MCV 87.0 03/03/2021   PLT 401 (H) 03/03/2021     Chemistry      Component Value Date/Time   NA 134 (L) 03/03/2021 1423   K 3.7 03/03/2021 1423   CL 97 (L) 03/03/2021 1423   CO2 29 03/03/2021 1423   BUN 17 03/03/2021 1423   CREATININE 0.64 03/03/2021 1423      Component Value Date/Time   CALCIUM 9.0 03/03/2021 1423   ALKPHOS 94 03/03/2021 1423   AST 13 (L) 03/03/2021 1423   ALT 8 03/03/2021 1423   BILITOT 0.2 (L) 03/03/2021 1423      Impression and Plan: Tami Stone is a very nice 70 year old white female.  She has 2 separate malignancies.  Her most "active" malignancy is the lung cancer.  Thankfully, she does have the BRAF mutation.  Hopefully, she will stay diligent with taking the [place] and the [place].  I really do wish that she would do the [facility] but again she does not feel confident taking this.  She read all the side effects.  I think we probably do need to get an MRI of the brain.  I  will probably set this up for October.  By then, she should be far enough out from stereotactic radiosurgery so we can see how the treatment has worked.  I do worry about this nodule in the left breast.  I do think that this is why got had to be removed at some point.  She does not want surgery right now.  She is definitely confident of her complementary therapies.  She feels that these will definitely help her.  Overall, her quality of life is doing well right now.  I am just happy about that.  She really had a tough stay in the hospital back in August.  I feel bad for her.  She has recovered quite nicely.  We will see about getting the MRI before I see her back.  I will plan to see her back in early November.   Volanda Napoleon, MD 9/21/20225:24 PM

## 2021-03-05 ENCOUNTER — Encounter: Payer: Self-pay | Admitting: Internal Medicine

## 2021-03-09 ENCOUNTER — Other Ambulatory Visit: Payer: Self-pay

## 2021-03-09 MED ORDER — LETROZOLE 2.5 MG PO TABS: 2.5000 mg | ORAL_TABLET | Freq: Every day | ORAL | 0 refills | Status: DC

## 2021-03-09 NOTE — Telephone Encounter (Signed)
Patient called stating she picked up her estrogen on the 20th and lost it, is requesting it be resent to pharmacy.

## 2021-03-12 LAB — FUNGAL ORGANISM REFLEX

## 2021-03-12 LAB — FUNGUS CULTURE RESULT

## 2021-03-12 LAB — FUNGUS CULTURE WITH STAIN

## 2021-03-13 LAB — ACID FAST CULTURE WITH REFLEXED SENSITIVITIES (MYCOBACTERIA): Acid Fast Culture: NEGATIVE

## 2021-03-14 NOTE — Progress Notes (Signed)
  Radiation Oncology         [phone] ________________________________  Name: Tami Stone MRN: [id number]  Date of Service: 03/15/2021  DOB: May 08, 1951  Post Treatment Telephone Note  Diagnosis:   Stage IV, NSCLC, adenocarcinoma of the left lung with BRAF mutation with newly noted brain metastases and synchronous Stage III, 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  Interval Since Last Radiation:  5 weeks    02/11/2021 through 02/11/2021 Site Technique Total Dose (Gy) Dose per Fx (Gy) Completed Fx Beam Energies  Brain:    PTV_1_LtCereb11m PTV_2_RtCereb314m PTV_3_RtCereb4m42mTV_4_LtFront6mm67mV_5_RtFront5mm 33m_6_LtPariet4mm P65m7_LtFront4mm  P31m8_RtFront5mm  IM71m20/20 20 1/1 6XFFF    Narrative:  The patient was contacted today for routine follow-up. During treatment she did very well with radiotherapy and did not have significant desquamation.   Impression/Plan: 1. 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 I was unable to reach the patient but left a voicemail and on the message, I  discussed that we would be happy to continue to follow her as needed, but she will also continue to follow up with Dr. Ennever Marin [facility] onc[facility].      Adamaris King CCarola Rhine

## 2021-03-15 ENCOUNTER — Ambulatory Visit
Admit: 2021-03-15 | Discharge: 2021-03-15 | Disposition: A | Discharge status: Home / Self Care | Payer: Medicare Other | Attending: Radiation Oncology | Admitting: Radiation Oncology

## 2021-03-15 DIAGNOSIS — C3492 Malignant neoplasm of unspecified part of left bronchus or lung: Secondary | ICD-10-CM

## 2021-03-16 ENCOUNTER — Other Ambulatory Visit: Payer: Self-pay

## 2021-03-16 ENCOUNTER — Ambulatory Visit (HOSPITAL_BASED_OUTPATIENT_CLINIC_OR_DEPARTMENT_OTHER)
Admission: RE | Admit: 2021-03-16 | Discharge: 2021-03-16 | Disposition: A | Discharge status: Home / Self Care | Payer: Medicare Other | Source: Ambulatory Visit | Attending: Hematology & Oncology | Admitting: Hematology & Oncology

## 2021-03-16 DIAGNOSIS — C3492 Malignant neoplasm of unspecified part of left bronchus or lung: Secondary | ICD-10-CM | POA: Diagnosis present

## 2021-03-16 IMAGING — DX DG CHEST 2V
2 series · 2 of 2 positions shown · non-contrast
Comparison: [DATE]

CLINICAL DATA: Cough and shortness of breath, history of lung
carcinoma

EXAM:
CHEST - 2 VIEW

[chest pa]
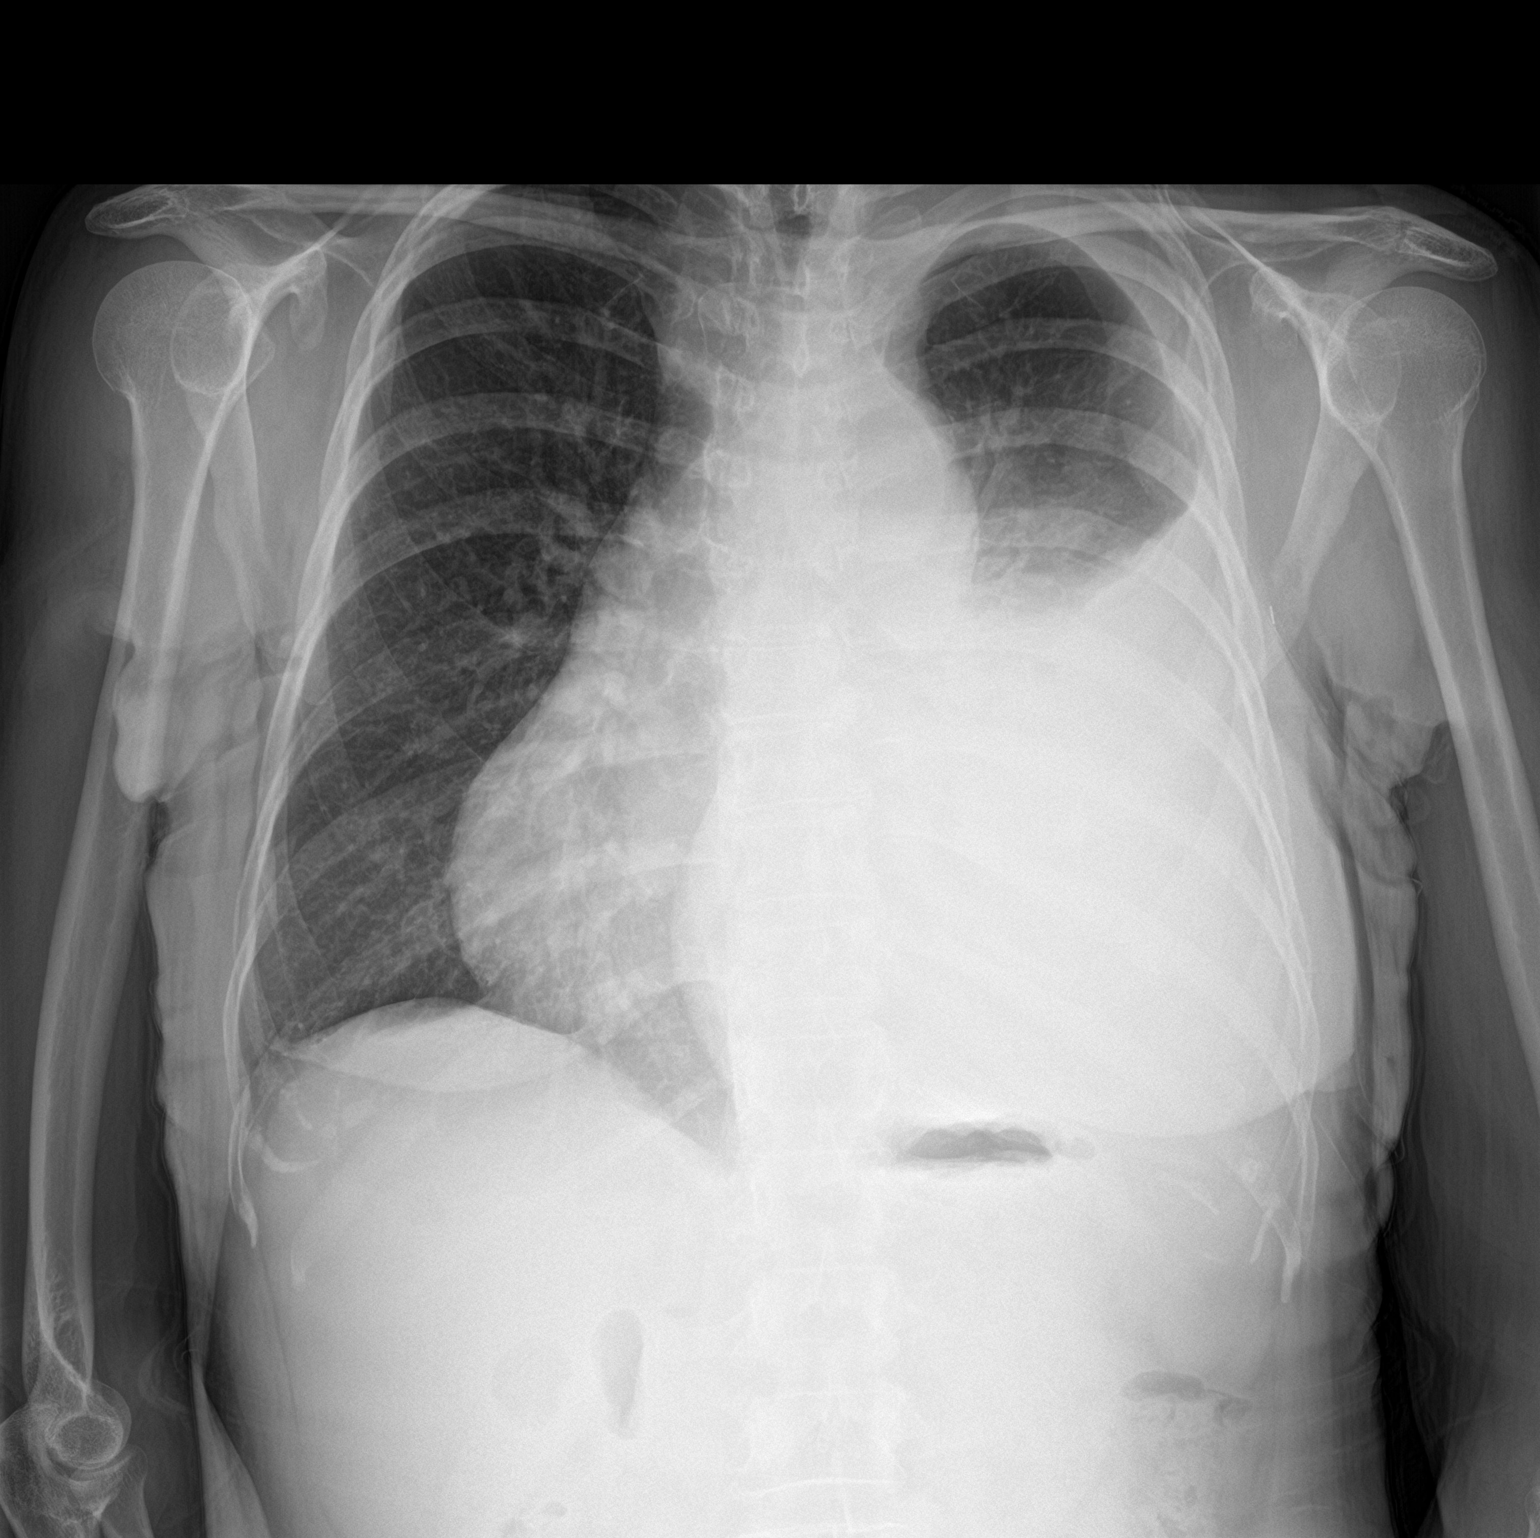

[chest lat]
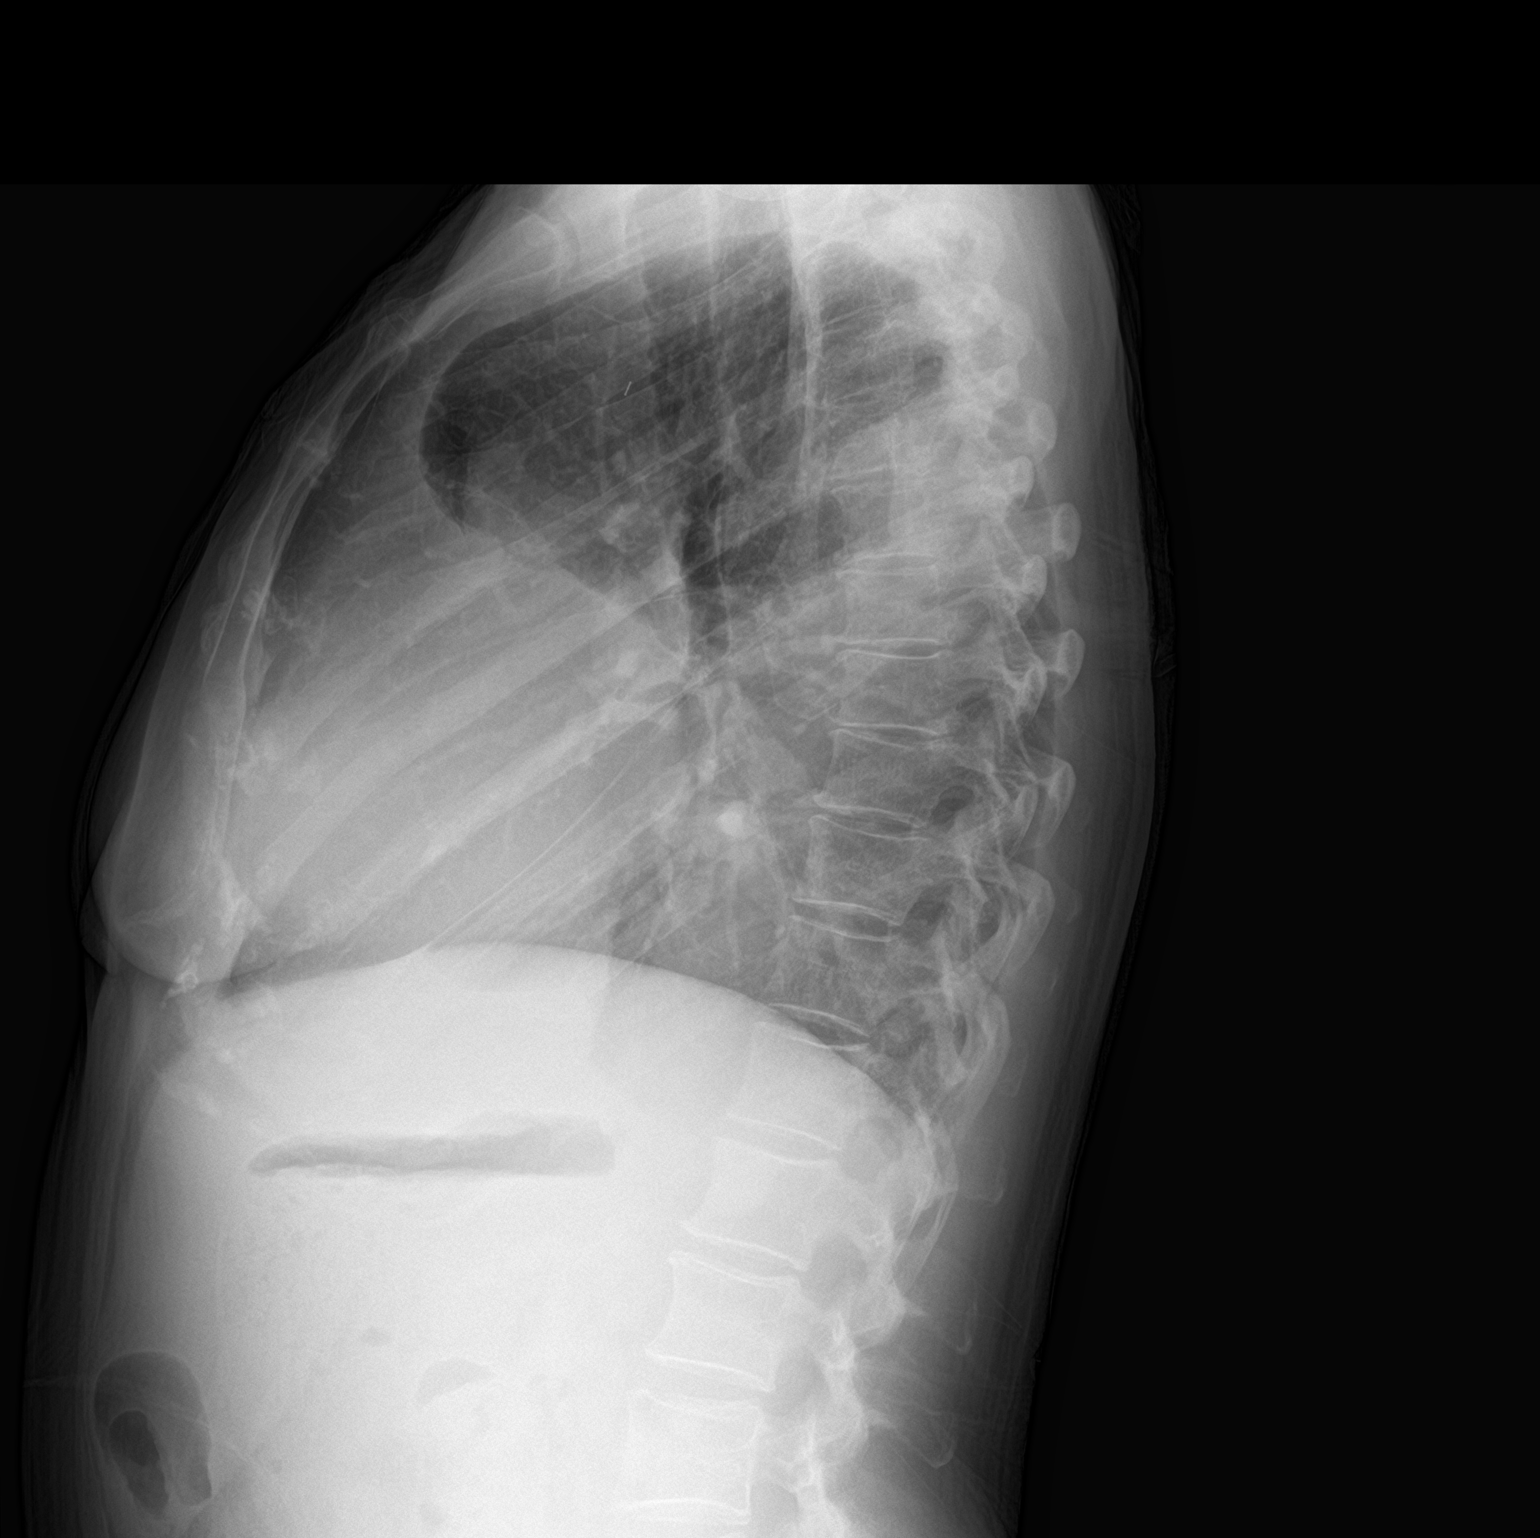

[2 of 2 positions shown; findings below may reference images not displayed]

FINDINGS: Significant increase in left-sided pleural effusion has occurred
since the prior exam. Cardiac shadow is enlarged but stable. Right
lung remains clear. No bony abnormality is noted.
IMPRESSION: Significant increase in left-sided pleural effusion when compared
with the prior exam.

## 2021-03-16 NOTE — Progress Notes (Signed)
Patient called stating she was still coughing and is requesting a chest x ray to be done just in case she has fluid on her lungs again. Discussed with Dr.Ennever who gave verbal order for chest x ray, pt called and informed.

## 2021-03-18 ENCOUNTER — Telehealth: Payer: Self-pay

## 2021-03-18 ENCOUNTER — Other Ambulatory Visit: Payer: Self-pay | Admitting: Hematology & Oncology

## 2021-03-18 DIAGNOSIS — J90 Pleural effusion, not elsewhere classified: Secondary | ICD-10-CM

## 2021-03-18 NOTE — Telephone Encounter (Signed)
LMTCB

## 2021-03-18 NOTE — Telephone Encounter (Signed)
-----   Message from Volanda Napoleon, MD sent at 03/18/2021  6:09 AM EDT ----- Call - the fluid is back in the left lung.  She needs another thoracentesis.  Remind me to put the order in!!  Laurey Arrow

## 2021-03-18 NOTE — Telephone Encounter (Signed)
Called and informed patient of results, patient verbalized understanding and denies any questions or concerns at this time. Pt advised she would receive a call to schedule u/s.

## 2021-03-22 ENCOUNTER — Ambulatory Visit (HOSPITAL_COMMUNITY)
Admission: RE | Admit: 2021-03-22 | Discharge: 2021-03-22 | Disposition: A | Discharge status: Home / Self Care | Payer: Medicare Other | Source: Ambulatory Visit | Attending: Hematology & Oncology | Admitting: Hematology & Oncology

## 2021-03-22 ENCOUNTER — Ambulatory Visit (HOSPITAL_COMMUNITY)
Admission: RE | Admit: 2021-03-22 | Discharge: 2021-03-22 | Disposition: A | Discharge status: Home / Self Care | Payer: Medicare Other | Source: Ambulatory Visit | Attending: Physician Assistant | Admitting: Physician Assistant

## 2021-03-22 DIAGNOSIS — J90 Pleural effusion, not elsewhere classified: Secondary | ICD-10-CM | POA: Insufficient documentation

## 2021-03-22 DIAGNOSIS — Z9889 Other specified postprocedural states: Secondary | ICD-10-CM

## 2021-03-22 IMAGING — US US THORACENTESIS ASP PLEURAL SPACE W/IMG GUIDE
1 series · 4 of 4 positions shown · non-contrast
Comparison: none

INDICATION: Malignant pleural effusion, cough

[Series 1: us thoracentesis asp pleural s mc & wl · 4 of 4 slices shown]
[im 1/4]
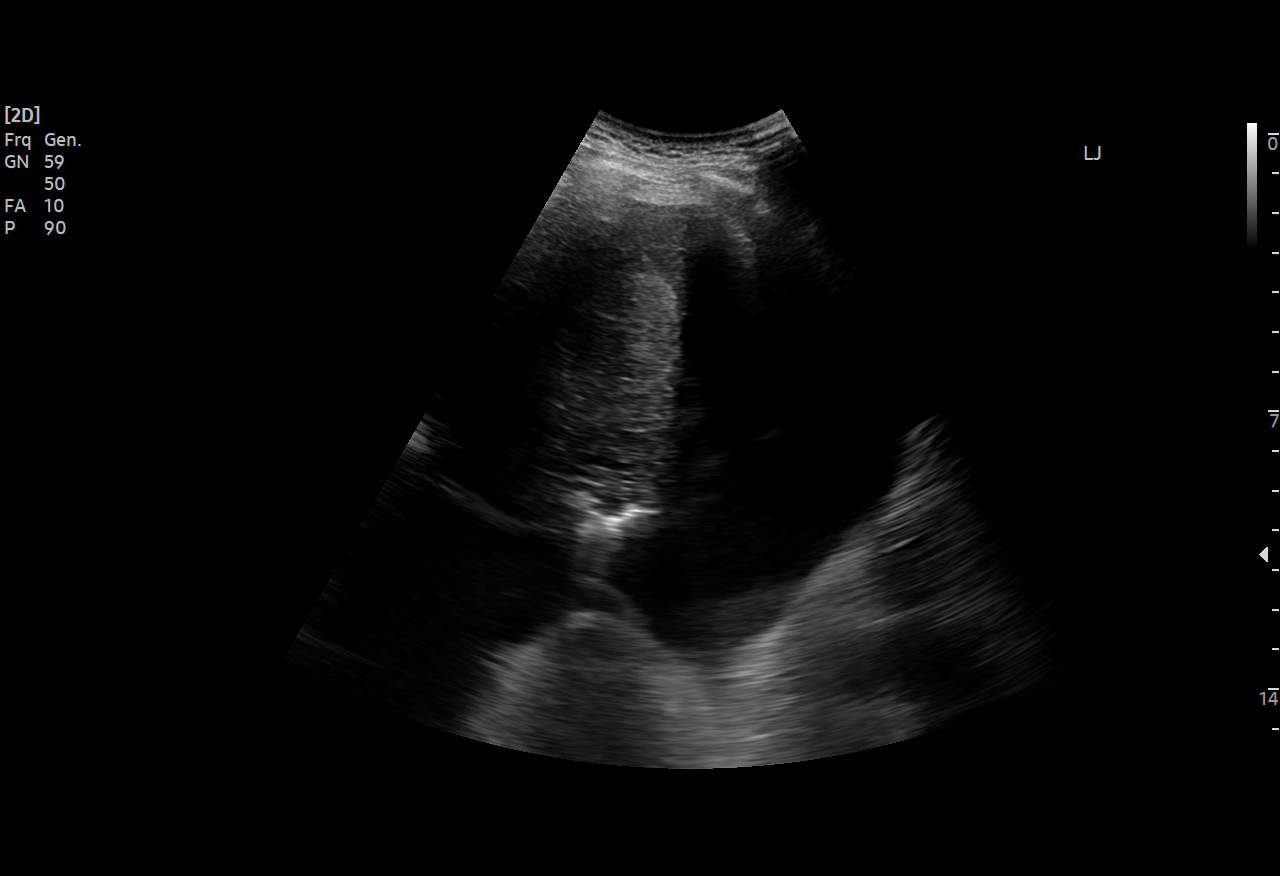
[im 2/4]
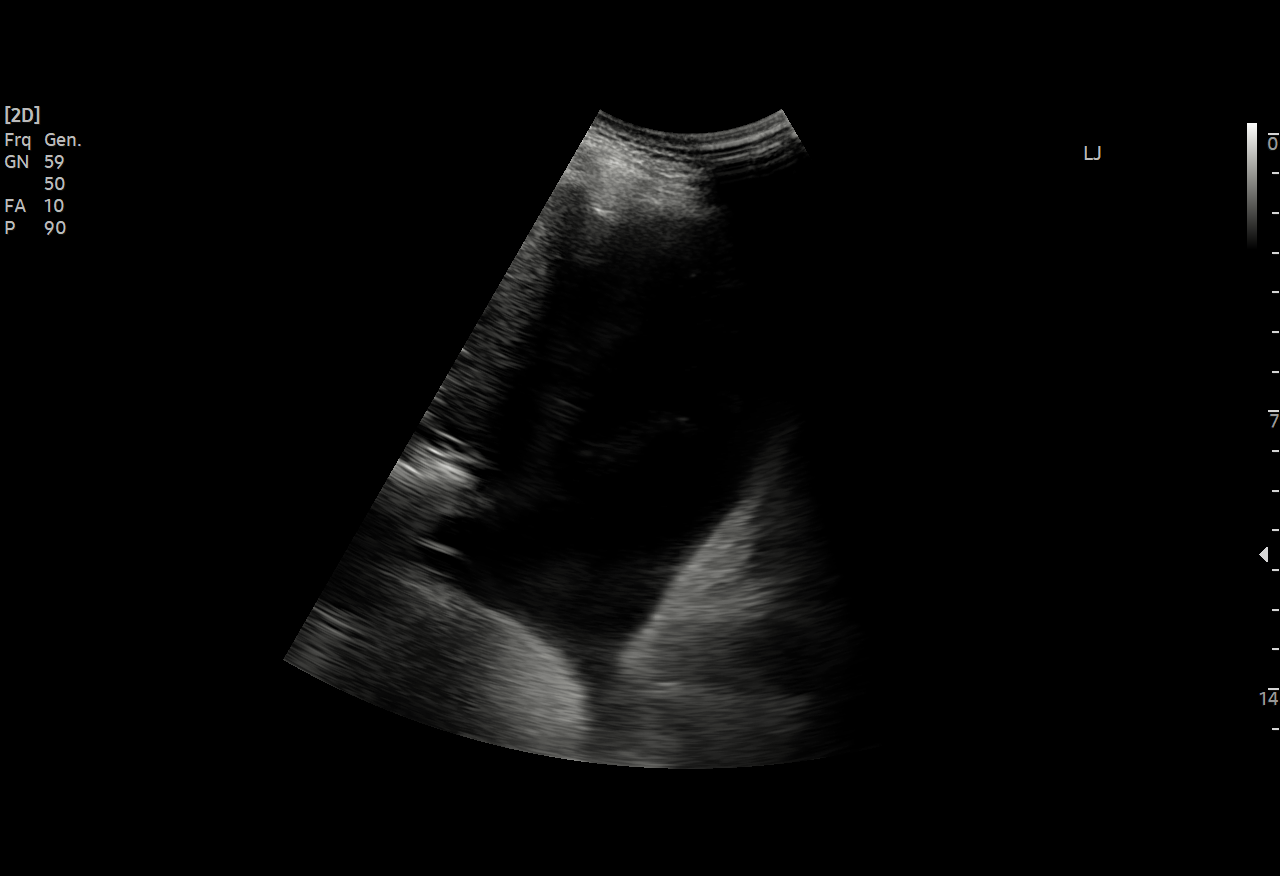
[im 3/4]
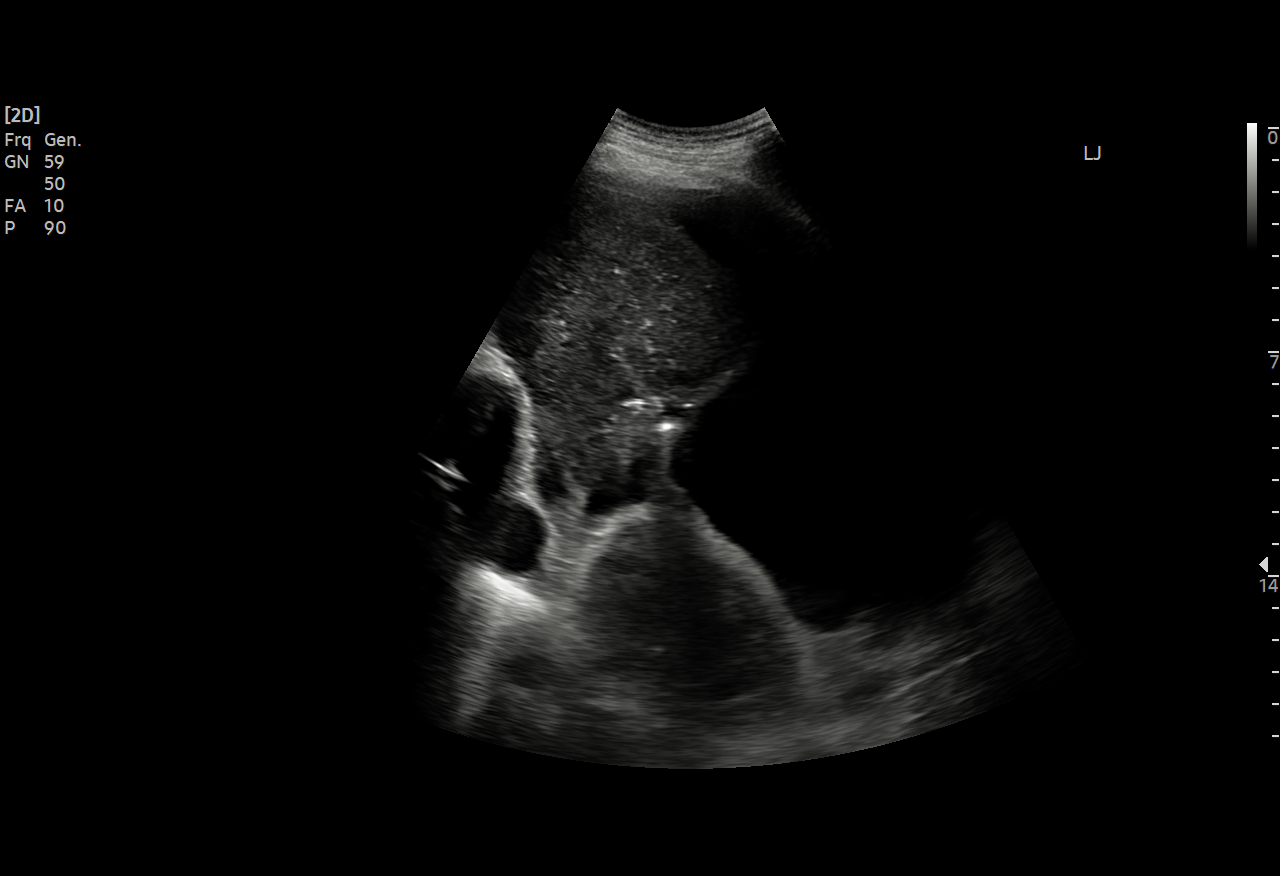
[im 4/4]
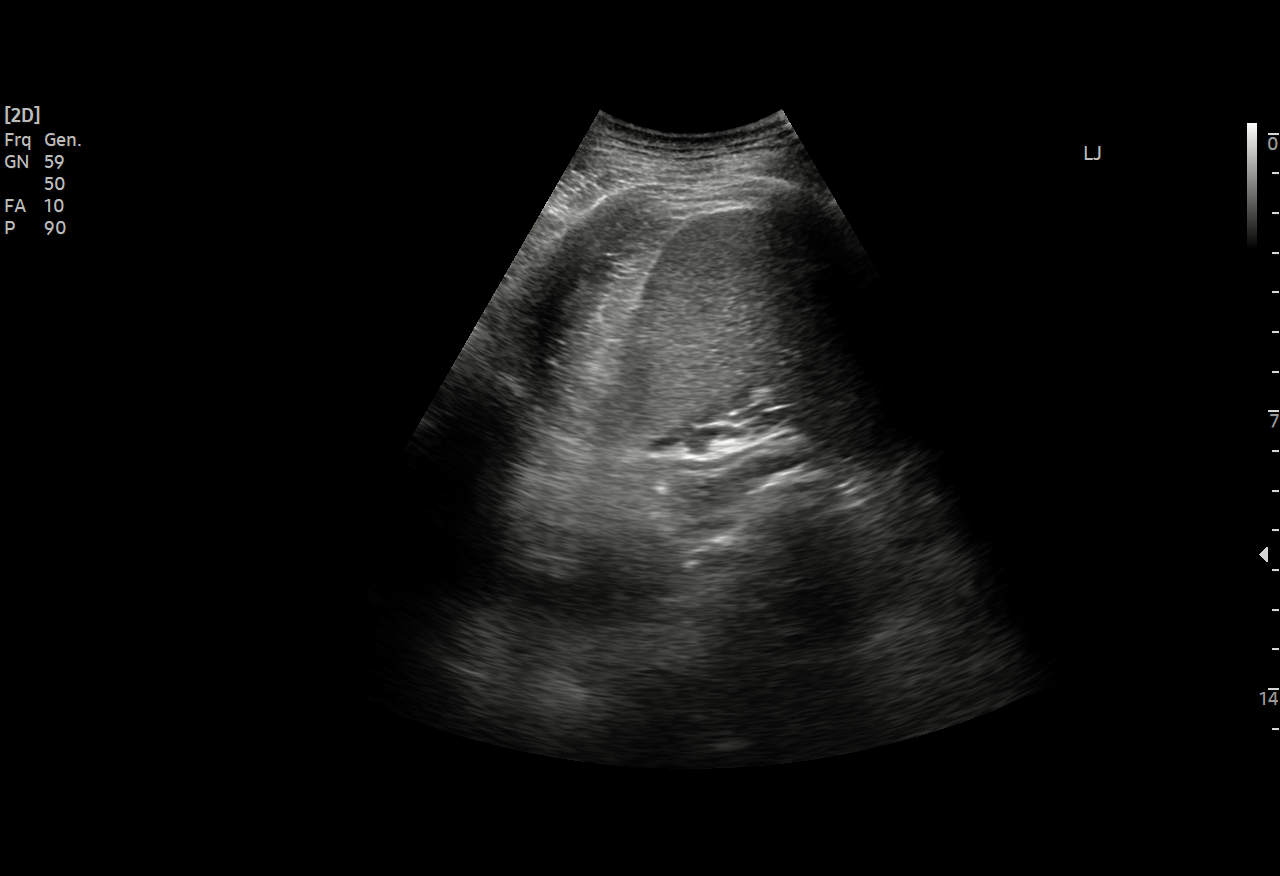

[4 of 4 positions shown; findings below may reference images not displayed]

EXAM:
ULTRASOUND GUIDED THERAPEUTIC LEFT THORACENTESIS

MEDICATIONS:
None.

COMPLICATIONS:
None immediate.

PROCEDURE:
An ultrasound guided thoracentesis was thoroughly discussed with the
patient and questions answered. The benefits, risks, alternatives
and complications were also discussed. The patient understands and
wishes to proceed with the procedure. Written consent was obtained.

Ultrasound was performed to localize and mark an adequate pocket of
fluid in the Left chest. The area was then prepped and draped in the
normal sterile fashion. 1% Lidocaine was used for local anesthesia.
Under ultrasound guidance a 6 Fr Safe-T-Centesis catheter was
introduced. Thoracentesis was performed. The catheter was removed
and a dressing applied.
FINDINGS: A total of approximately 1L of serous fluid was removed.
IMPRESSION: Successful ultrasound guided left thoracentesis yielding 1L of
pleural fluid.

Read and performed by: KAMOTENG, PA-C

## 2021-03-22 IMAGING — CR DG CHEST 1V
1 series · 1 of 1 positions shown · non-contrast
Comparison: IR ultrasound, earlier same day. Chest radiograph,
[DATE] and [DATE]. CT chest, [DATE].

CLINICAL DATA: Post thoracentesis.

EXAM:
CHEST  1 VIEW

[w chest pa]
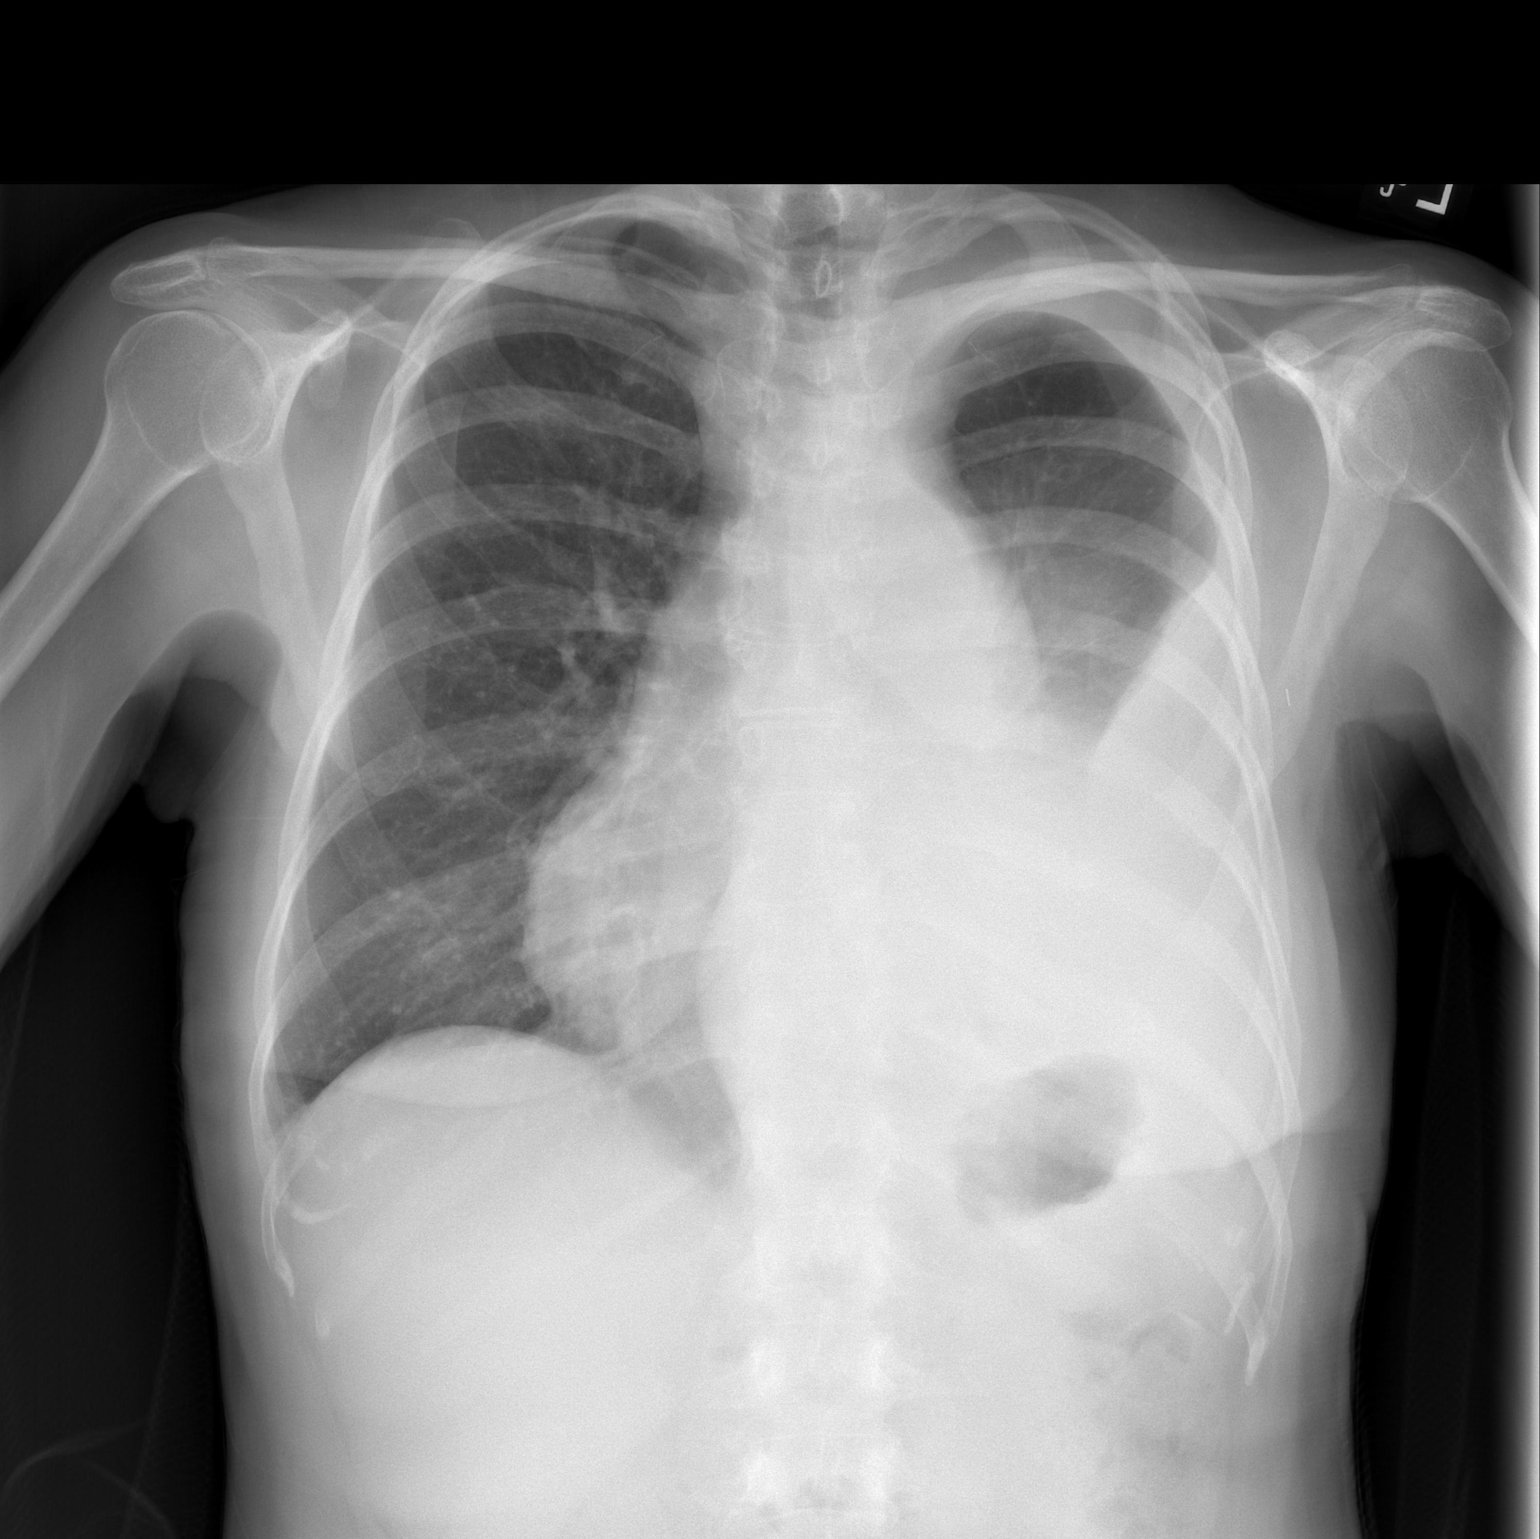

[1 of 1 positions shown; findings below may reference images not displayed]

FINDINGS: Similar, mild rightward shifting of the cardiac silhouette.
Persistent LEFT basilar opacity, silhouetting the cardiac apex.
Decreased volume of pleural effusion, with moderate residual. No
pneumothorax. No interval osseous abnormality.
IMPRESSION: 1. Decreased volume with moderate residual LEFT pleural effusion.
Persistent LEFT basilar consolidation, favor atelectasis.
2. No postprocedure pneumothorax.

## 2021-03-22 MED ORDER — LIDOCAINE HCL 1 % IJ SOLN
INTRAMUSCULAR | Status: AC
  Administered 2021-03-22: 10 mL
  Filled 2021-03-22: qty 20

## 2021-03-23 NOTE — Procedures (Signed)
PROCEDURE SUMMARY:  Successful US guided therapeutic thoracentesis. Yielded 1 L of serous fluid. Pt tolerated procedure well. No immediate complications.  Specimen not sent for labs. CXR ordered.  EBL < 5 mL  Drea Jurewicz PA-C 03/23/2021 7:50 AM

## 2021-04-03 ENCOUNTER — Ambulatory Visit (HOSPITAL_BASED_OUTPATIENT_CLINIC_OR_DEPARTMENT_OTHER)
Admission: RE | Admit: 2021-04-03 | Discharge: 2021-04-03 | Disposition: A | Discharge status: Home / Self Care | Payer: Medicare Other | Source: Ambulatory Visit | Attending: Hematology & Oncology | Admitting: Hematology & Oncology

## 2021-04-03 ENCOUNTER — Other Ambulatory Visit: Payer: Self-pay

## 2021-04-03 DIAGNOSIS — C3492 Malignant neoplasm of unspecified part of left bronchus or lung: Secondary | ICD-10-CM | POA: Diagnosis not present

## 2021-04-03 IMAGING — MR MR HEAD WO/W CM
10 of 12 series · 36 of 48 positions shown · IV contrast (GADAVIST)
Comparison: MRI of the brain [DATE].

CLINICAL DATA: Non-small cell lung cancer, metastatic, assess
treatment response. Adenocarcinoma of left lung, stage IV. Status
post SBRT for brain Mets.

EXAM:
MRI HEAD WITHOUT AND WITH CONTRAST
TECHNIQUE: Multiplanar, multiecho pulse sequences of the brain and surrounding
structures were obtained without and with intravenous contrast.
CONTRAST:  6mL GADAVIST GADOBUTROL 1 MMOL/ML IV SOLN

[Series 2: T1 · sagittal · 5.0mm · 0.45mm/px · 3 of 23 slices shown]
[im 1/23]
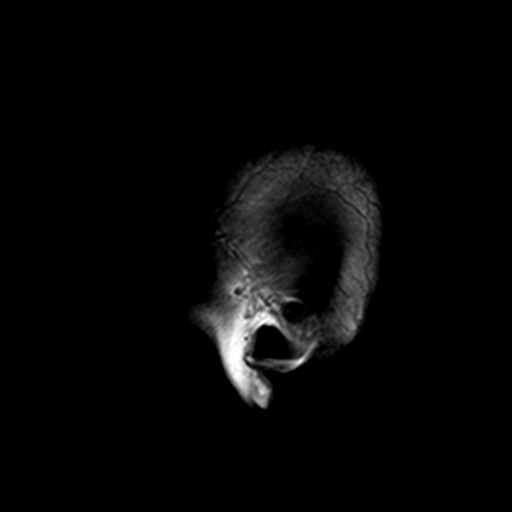
[im 12/23]
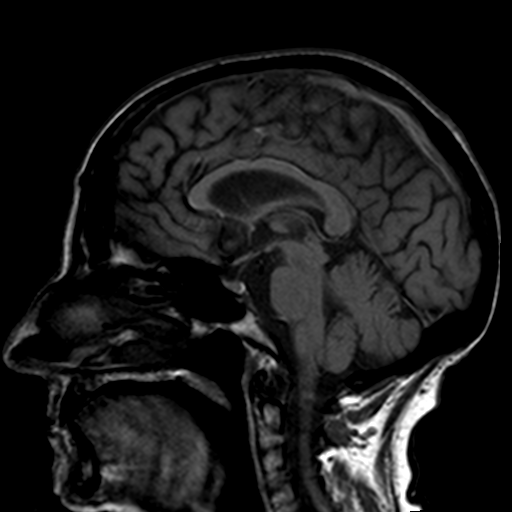
[im 23/23]
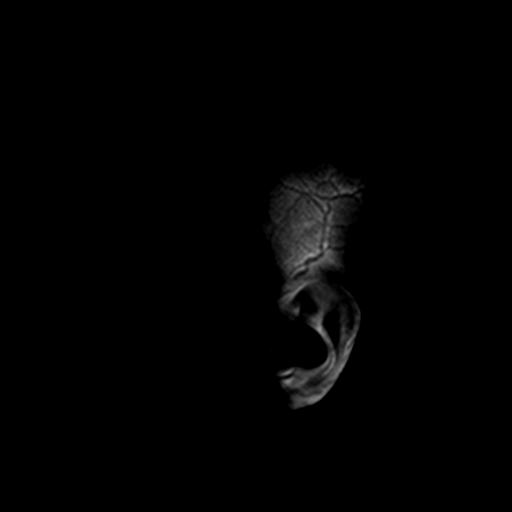

[Series 3: DWI · axial · 3.0mm · 1.80mm/px · z∈[-58,+87]mm · 11 of 89 slices shown (1 of 2)]
[im 1/89]
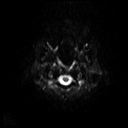
[im 9/89]
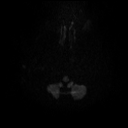
[im 18/89]
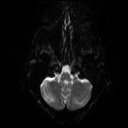
[im 27/89]
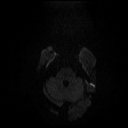
[im 36/89]
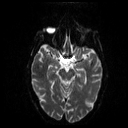
[im 45/89]
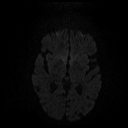
[im 53/89]
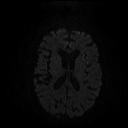
[im 62/89]
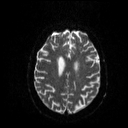
[im 71/89]
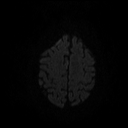
[im 80/89]
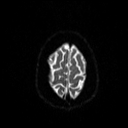
[im 89/89]
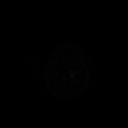

[Series 4: DWI · axial · 3.0mm · 1.80mm/px · z∈[-58,+87]mm · 6 of 45 slices shown (2 of 2)]
[im 1/45]
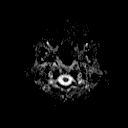
[im 9/45]
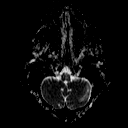
[im 18/45]
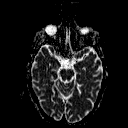
[im 27/45]
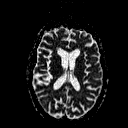
[im 36/45]
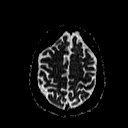
[im 45/45]
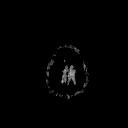

[Series 5: T2 · axial · 5.0mm · 0.45mm/px · z∈[-62,+92]mm · 2 of 23 slices shown (1 of 2)]
[im 1/23]
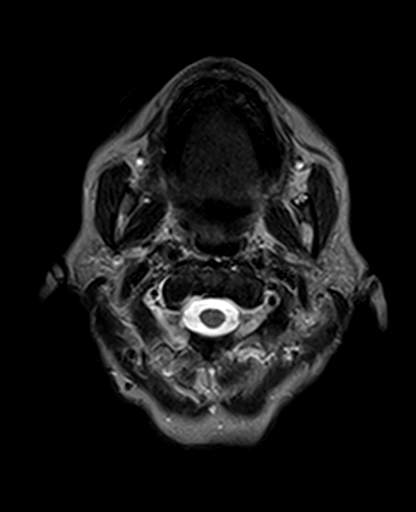
[im 23/23]
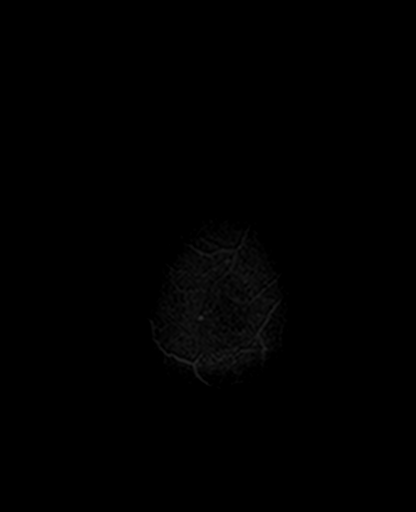

[Series 6: T2 · axial · 5.0mm · 0.45mm/px · z∈[-62,+92]mm · 2 of 23 slices shown (2 of 2)]
[im 1/23]
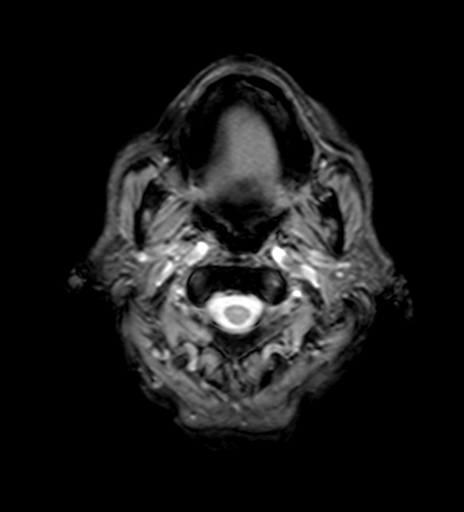
[im 23/23]
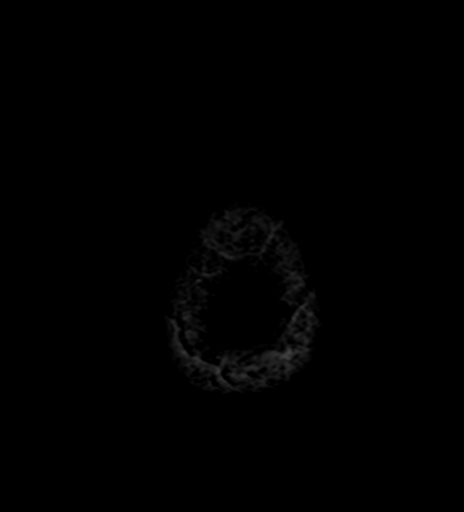

[Series 7: FLAIR · axial · 5.0mm · 0.45mm/px · z∈[-62,+92]mm · 2 of 23 slices shown (1 of 2)]
[im 1/23]
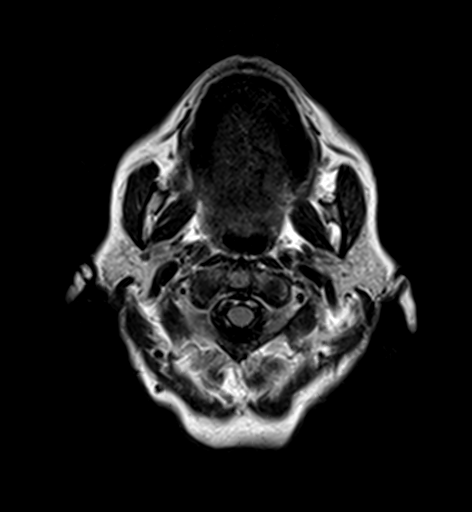
[im 23/23]
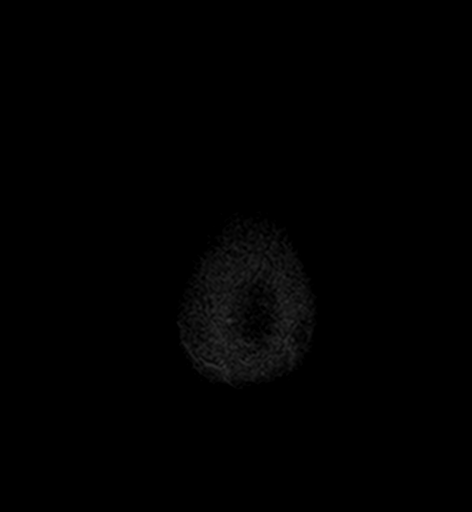

[Series 9: T2 post-contrast · coronal · 5.0mm · 0.45mm/px · 3 of 28 slices shown]
[im 1/28]
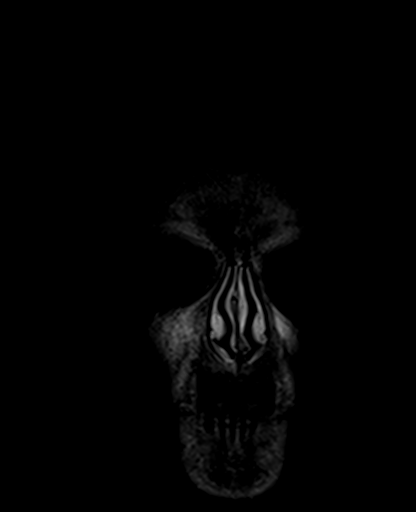
[im 14/28]
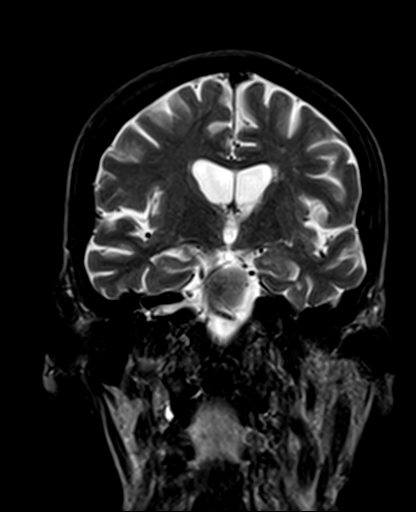
[im 28/28]
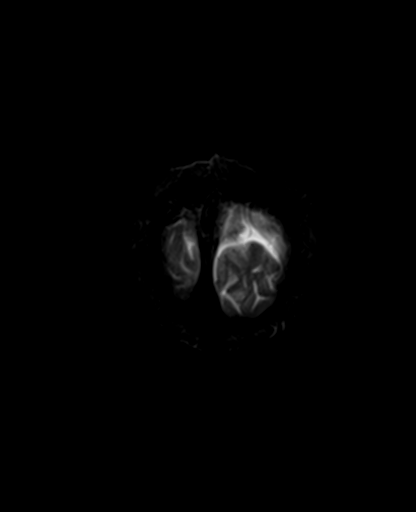

[Series 11: T1 post-contrast · coronal · 5.0mm · 0.45mm/px · 3 of 28 slices shown (1 of 2)]
[im 1/28]
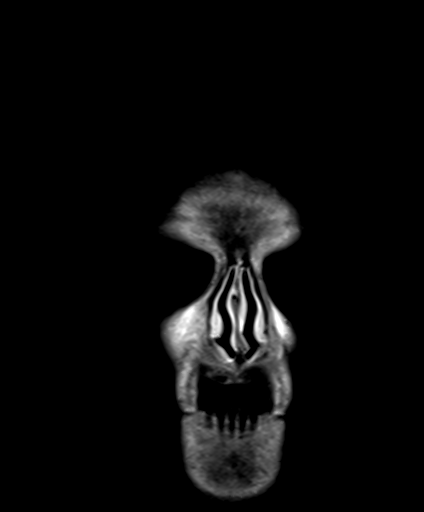
[im 14/28]
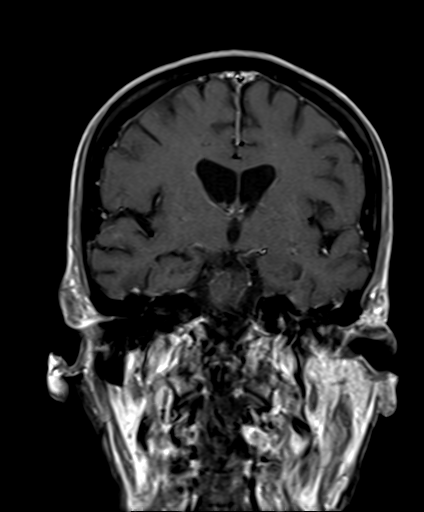
[im 28/28]
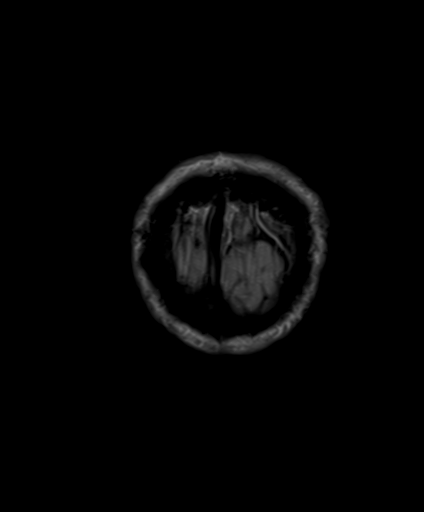

[Series 12: T1 post-contrast · sagittal · 5.0mm · 0.45mm/px · 2 of 23 slices shown (2 of 2)]
[im 1/23]
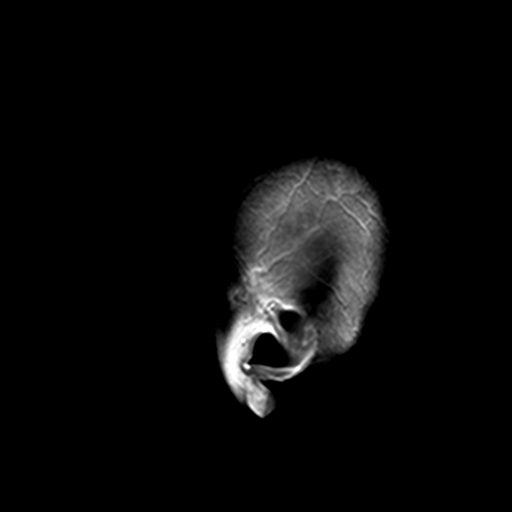
[im 23/23]
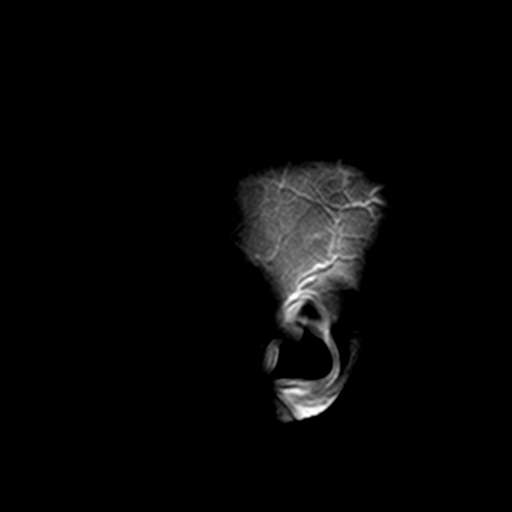

[Series 13: FLAIR · axial · 5.0mm · 0.45mm/px · z∈[-62,+92]mm · 2 of 23 slices shown (2 of 2)]
[im 1/23]
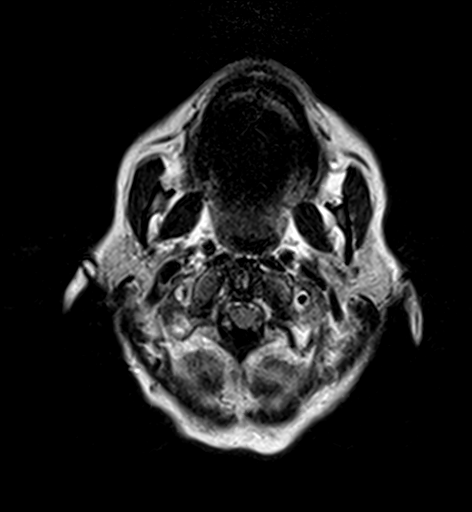
[im 23/23]
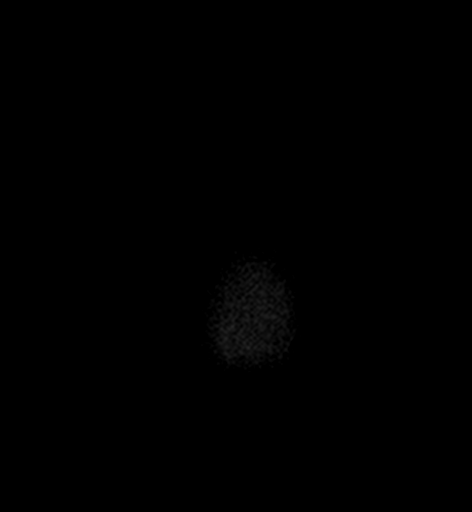

[36 of 48 positions shown; findings below may reference images not displayed]

FINDINGS: Brain: Small enhancing lesions described on prior MRI are not
identified in the current study. A new lesion with faint peripheral
contrast enhancement and mild surrounding vasogenic edema is seen in
the medial aspect of the left postcentral gyrus (series 10, image
45). A punctate focus of contrast enhancement is only seen on axial
postcontrast (series 10, image 19), also new.

Multiple punctate foci of restricted diffusion are seen in the
bilateral corona radiata and centrum semiovale without corresponding
contrast enhancement, may represent acute/subacute infarcts.
However, short-term follow-up suggested to evaluate for progression.

No hemorrhage, hydrocephalus or significant mass effect. Scattered
foci of T2 hyperintensity are seen within the white matter of the
cerebral hemispheres, nonspecific, may represent chronic
microangiopathy versus posttreatment changes.

Vascular: Normal flow voids.

Skull and upper cervical spine: Normal marrow signal.

Sinuses/Orbits: Negative.

Other: None.
IMPRESSION: 1. Mixed treatment response with resolution of enhancing lesion seen
on prior MRI. However, there is at least 1 new lesion in the left
parietal lobe and 1 possible lesion in the left cerebellar
hemisphere.
2. Multiple punctate foci of restricted diffusion without associated
contrast enhancement may represent small lacunar infarcts. However,
short-term follow-up in 6-8 weeks is suggested to evaluate for
progression.

## 2021-04-03 MED ORDER — GADOBUTROL 1 MMOL/ML IV SOLN
6.0000 mL | Freq: Once | INTRAVENOUS | Status: AC | PRN
  Administered 2021-04-03: 6 mL via INTRAVENOUS

## 2021-04-06 ENCOUNTER — Other Ambulatory Visit: Payer: Self-pay | Admitting: Radiation Therapy

## 2021-04-06 ENCOUNTER — Encounter: Payer: Self-pay | Admitting: *Deleted

## 2021-04-06 ENCOUNTER — Telehealth: Payer: Self-pay

## 2021-04-06 NOTE — Telephone Encounter (Signed)
Patient called requesting MRI results, called patient and informed her MD is out of office and she has an appt on 11/2 to review MRI results. Patient confirmed and denies any other questions at this time.

## 2021-04-06 NOTE — Progress Notes (Signed)
Please make sure that Rad Onc gets this MRI report.   Please call the patient to let her know that she will need more radiation -- hopefully SBRT.  Thanks!!   Laurey Arrow  This message as well as MRI report forwarded to Shona Simpson [facility] in Radiation Oncology.   Called patient and notified her of MRI results, and that Radiation Oncology may reach out to her to discuss more treatment. She was appreciative of the information.   Oncology Nurse Navigator Documentation  Oncology Nurse Navigator Flowsheets 04/06/2021  Abnormal Finding Date -  Confirmed Diagnosis Date -  Diagnosis Status -  Navigator Follow Up Date: 04/14/2021  Navigator Follow Up Reason: Follow-up Appointment  Navigation Complete Date: -  Post Navigation: Continue to Follow Patient? -  Reason Not Navigating Patient: -  Navigator Location [facility]  Referral Date to RadOnc/MedOnc -  Navigator Encounter Type Diagnostic Results;Telephone  Telephone Outgoing Call;Education  [facility] Date -  Multidisiplinary Clinic Type -  Patient Visit Type MedOnc  Treatment Phase Active Tx  Barriers/Navigation Needs Coordination of Care;Education  Education Other  Interventions Coordination of Care;Education  Acuity Level 2-Minimal Needs (1-2 Barriers Identified)  Coordination of Care Radiology  Education Method Verbal  Support Groups/Services Friends and Family  Time Spent with Patient 15

## 2021-04-12 ENCOUNTER — Inpatient Hospital Stay: Payer: Medicare Other | Attending: Hematology & Oncology

## 2021-04-14 ENCOUNTER — Ambulatory Visit (HOSPITAL_BASED_OUTPATIENT_CLINIC_OR_DEPARTMENT_OTHER)
Admission: RE | Admit: 2021-04-14 | Discharge: 2021-04-14 | Disposition: A | Discharge status: Home / Self Care | Payer: Medicare Other | Source: Ambulatory Visit | Attending: Hematology & Oncology | Admitting: Hematology & Oncology

## 2021-04-14 ENCOUNTER — Other Ambulatory Visit: Payer: Self-pay

## 2021-04-14 ENCOUNTER — Inpatient Hospital Stay (HOSPITAL_BASED_OUTPATIENT_CLINIC_OR_DEPARTMENT_OTHER): Payer: Medicare Other | Admitting: Hematology & Oncology

## 2021-04-14 ENCOUNTER — Inpatient Hospital Stay: Payer: Medicare Other | Attending: Hematology & Oncology

## 2021-04-14 ENCOUNTER — Encounter: Payer: Self-pay | Admitting: *Deleted

## 2021-04-14 ENCOUNTER — Encounter: Payer: Self-pay | Admitting: Hematology & Oncology

## 2021-04-14 VITALS — BP 105/69 | HR 100 | Temp 98.2°F | Resp 18 | Ht 62.0 in | Wt 124.8 lb

## 2021-04-14 DIAGNOSIS — C50512 Malignant neoplasm of lower-outer quadrant of left female breast: Secondary | ICD-10-CM

## 2021-04-14 DIAGNOSIS — Z170 Estrogen receptor positive status [ER+]: Secondary | ICD-10-CM | POA: Insufficient documentation

## 2021-04-14 DIAGNOSIS — C7931 Secondary malignant neoplasm of brain: Secondary | ICD-10-CM | POA: Insufficient documentation

## 2021-04-14 DIAGNOSIS — Z79811 Long term (current) use of aromatase inhibitors: Secondary | ICD-10-CM | POA: Insufficient documentation

## 2021-04-14 DIAGNOSIS — C3492 Malignant neoplasm of unspecified part of left bronchus or lung: Secondary | ICD-10-CM | POA: Insufficient documentation

## 2021-04-14 DIAGNOSIS — D510 Vitamin B12 deficiency anemia due to intrinsic factor deficiency: Secondary | ICD-10-CM | POA: Diagnosis not present

## 2021-04-14 DIAGNOSIS — C7951 Secondary malignant neoplasm of bone: Secondary | ICD-10-CM | POA: Insufficient documentation

## 2021-04-14 DIAGNOSIS — Z79899 Other long term (current) drug therapy: Secondary | ICD-10-CM | POA: Insufficient documentation

## 2021-04-14 DIAGNOSIS — C782 Secondary malignant neoplasm of pleura: Secondary | ICD-10-CM | POA: Insufficient documentation

## 2021-04-14 DIAGNOSIS — Z7984 Long term (current) use of oral hypoglycemic drugs: Secondary | ICD-10-CM | POA: Insufficient documentation

## 2021-04-14 LAB — CBC WITH DIFFERENTIAL (CANCER CENTER ONLY)
Abs Immature Granulocytes: 0.04 10*3/uL (ref 0.00–0.07)
Basophils Absolute: 0.1 10*3/uL (ref 0.0–0.1)
Basophils Relative: 1 %
Eosinophils Absolute: 0.4 10*3/uL (ref 0.0–0.5)
Eosinophils Relative: 5 %
HCT: 30.1 % — ABNORMAL LOW (ref 36.0–46.0)
Hemoglobin: 9.7 g/dL — ABNORMAL LOW (ref 12.0–15.0)
Immature Granulocytes: 0 %
Lymphocytes Relative: 22 %
Lymphs Abs: 2.1 10*3/uL (ref 0.7–4.0)
MCH: 26.9 pg (ref 26.0–34.0)
MCHC: 32.2 g/dL (ref 30.0–36.0)
MCV: 83.6 fL (ref 80.0–100.0)
Monocytes Absolute: 0.9 10*3/uL (ref 0.1–1.0)
Monocytes Relative: 9 %
Neutro Abs: 6.2 10*3/uL (ref 1.7–7.7)
Neutrophils Relative %: 63 %
Platelet Count: 571 10*3/uL — ABNORMAL HIGH (ref 150–400)
RBC: 3.6 MIL/uL — ABNORMAL LOW (ref 3.87–5.11)
RDW: 15.6 % — ABNORMAL HIGH (ref 11.5–15.5)
WBC Count: 9.7 10*3/uL (ref 4.0–10.5)
nRBC: 0 % (ref 0.0–0.2)

## 2021-04-14 LAB — CMP (CANCER CENTER ONLY)
ALT: 10 U/L (ref 0–44)
AST: 15 U/L (ref 15–41)
Albumin: 3.2 g/dL — ABNORMAL LOW (ref 3.5–5.0)
Alkaline Phosphatase: 94 U/L (ref 38–126)
Anion gap: 10 (ref 5–15)
BUN: 17 mg/dL (ref 8–23)
CO2: 28 mmol/L (ref 22–32)
Calcium: 9.5 mg/dL (ref 8.9–10.3)
Chloride: 96 mmol/L — ABNORMAL LOW (ref 98–111)
Creatinine: 0.75 mg/dL (ref 0.44–1.00)
GFR, Estimated: >60 mL/min (ref >60)
Glucose, Bld: 148 mg/dL — ABNORMAL HIGH (ref 70–99)
Potassium: 4.1 mmol/L (ref 3.5–5.1)
Sodium: 134 mmol/L — ABNORMAL LOW (ref 135–145)
Total Bilirubin: 0.3 mg/dL (ref 0.3–1.2)
Total Protein: 6.7 g/dL (ref 6.5–8.1)

## 2021-04-14 LAB — LACTATE DEHYDROGENASE: LDH: 172 U/L (ref 98–192)

## 2021-04-14 IMAGING — DX DG CHEST 2V
2 series · 2 of 2 positions shown · non-contrast
Comparison: Chest x-ray [DATE], CT chest [DATE]

CLINICAL DATA: Recurrent pleural effusion

EXAM:
CHEST - 2 VIEW

[chest pa]
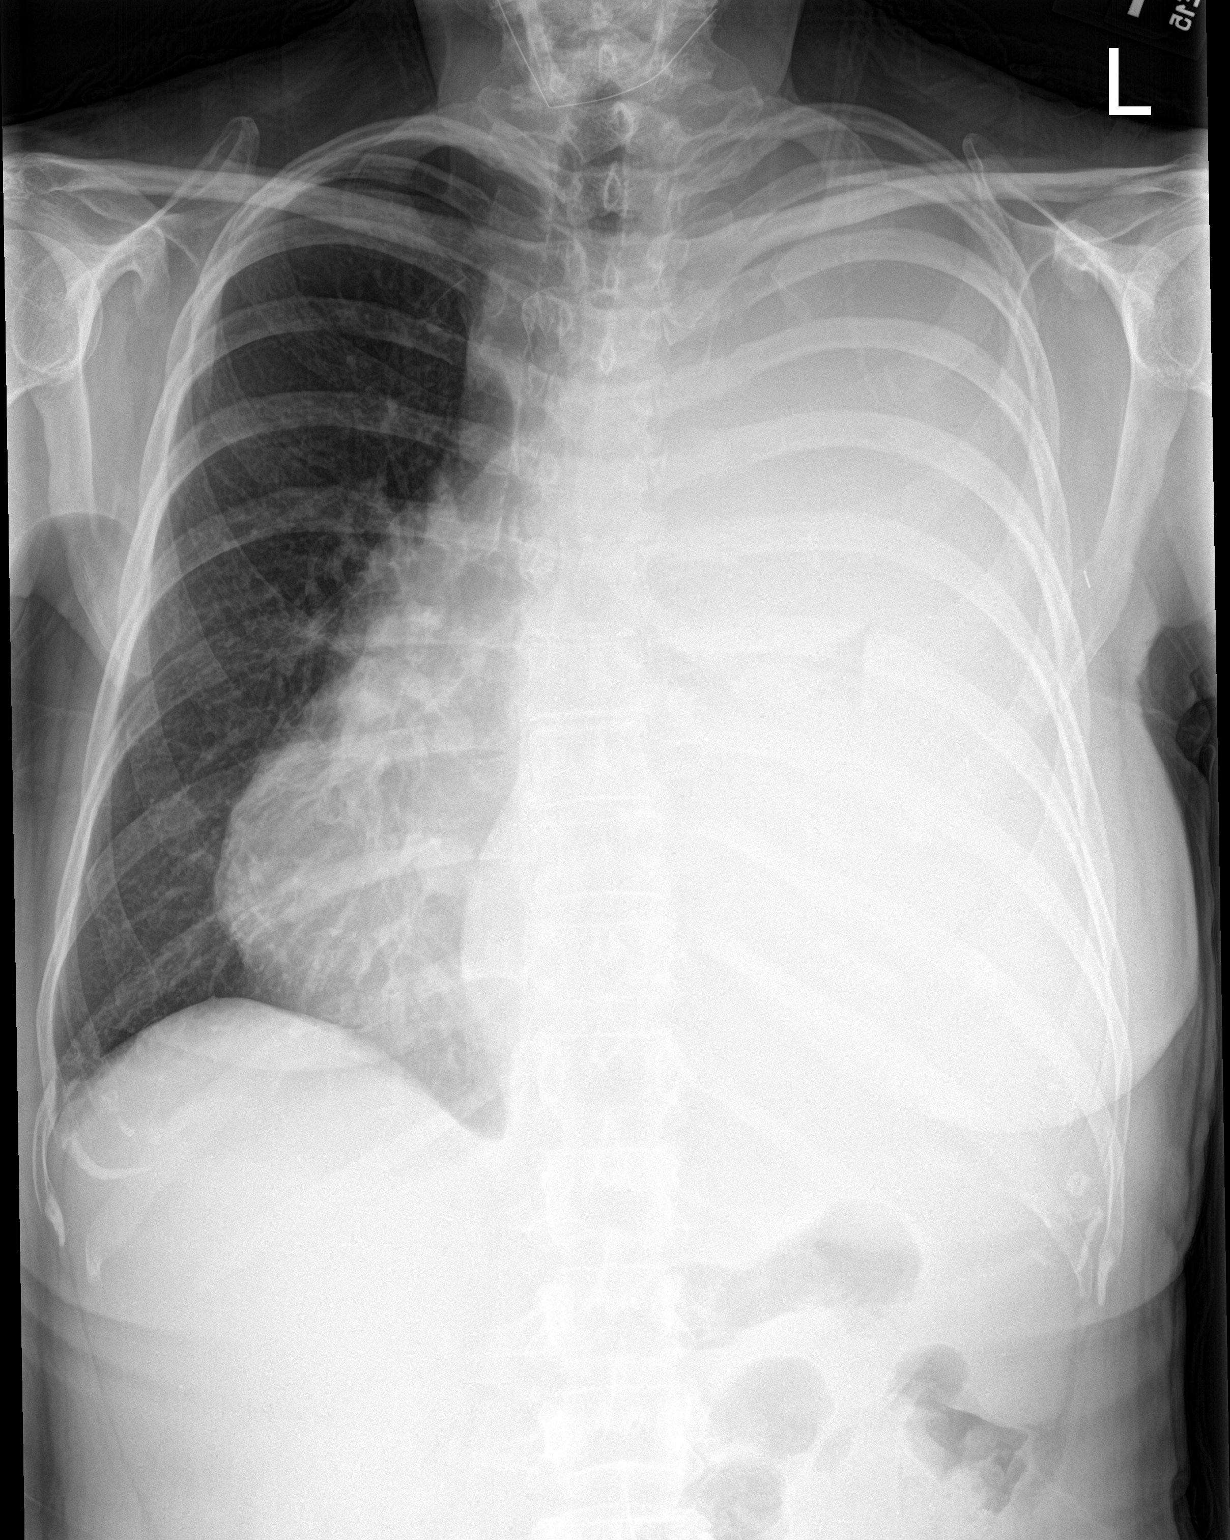

[chest lat]
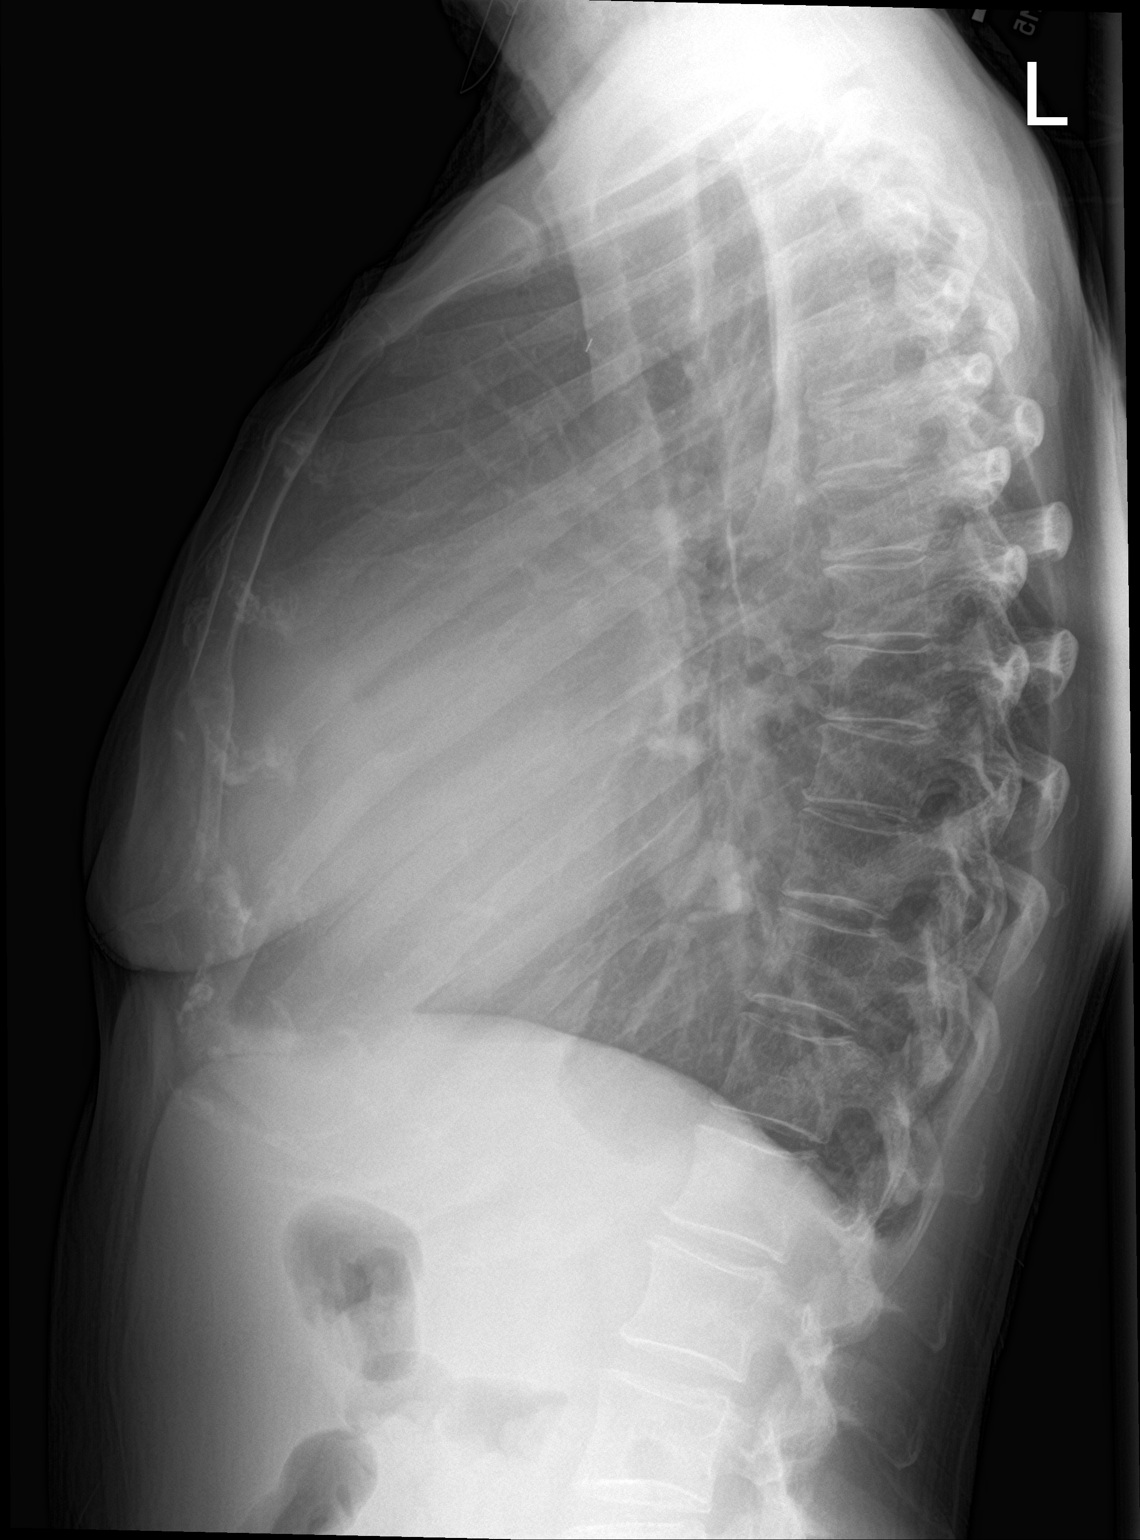

[2 of 2 positions shown; findings below may reference images not displayed]

FINDINGS: The heart is displaced to the right. Complete opacification of the
left hemithorax consistent large pleural effusion and associated
left lung atelectasis. Right lung and pleura appear clear. No
pneumothorax visualized.
IMPRESSION: Complete opacification of the left hemithorax consistent with large
pleural effusion and associated lung atelectasis.

## 2021-04-14 NOTE — Progress Notes (Signed)
Hematology and Oncology Follow Up Visit  Tami Stone [id number] 21-Oct-1950 70 y.o. 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  She is little bit more short of breath.  We did do a chest x-ray on her today.  Unfortunately she has a large left pleural effusion again.  Again had to have this drained.  She had a MRI done of the brain back on 04/03/2021.  Unfortunate, this showed that there was a new lesion in the left parietal lobe and 1 in the left cerebellar region.  We will have to see if she would qualify for more stereotactic radiosurgery.  She is worried about the Femara causing the brain mets.  I am not aware of any data that shows that Femara causes brain mets.  However she is not taking the Femara.  We try to to get her on immunotherapy.  She is not confident about immunotherapy even though her tumor has a high level of PD-L1.  She has a nodule under the left breast.  This does appear to be low but more prominent.  We will get have to get her set up with scans to see how everything looks.  I do have a feeling that she is probably progressing.  She is still on a very organic type protocol.  She is very confident with taking more organic and natural remedies.  She has not complained of any pain.  She is not having any diarrhea.  There is no leg swelling.  Overall, her performance status is ECOG 1.    Medications:  Current Outpatient Medications:    acetaminophen (TYLENOL) 325 MG tablet, Take 2 tablets (650 mg total) by mouth every 6 (six) hours as needed for mild pain, moderate pain or fever., Disp: 20 tablet, Rfl: 0   Coenzyme Q10 (CO Q 10 PO), Take 300 mg by mouth at  bedtime., Disp: , Rfl:    dabrafenib mesylate (TAFLINAR) 50 MG capsule, Take 100 mg by mouth 2 (two) times daily. Take on an empty stomach 1 hour before or 2 hours after meals., Disp: , Rfl:    DANDELION PO, Take 1 capsule by mouth daily., Disp: , Rfl:    diphenhydrAMINE-zinc acetate (BENADRYL) cream, Apply topically as needed for itching (as needed for itching as labeled)., Disp: 28.4 g, Rfl: 0   dronabinol (MARINOL) 5 MG capsule, Take 1 capsule (5 mg total) by mouth 2 (two) times daily before a meal., Disp: 60 capsule, Rfl: 2   feeding supplement (ENSURE ENLIVE / ENSURE PLUS) LIQD, Take 237 mLs by mouth 3 (three) times daily between meals., Disp: 237 mL, Rfl: 12   ibuprofen (ADVIL) 200 MG tablet, Take 400 mg by mouth every 6 (six) hours as needed for mild pain., Disp: , Rfl:    IVERMECTIN PO, Take 1.8 mLs by mouth daily. Ivermectin oil - 10 mg/ml, Disp: , Rfl:    letrozole (FEMARA) 2.5 MG tablet, Take 1 tablet (2.5 mg total) by mouth daily., Disp: 30 tablet, Rfl: 0   metFORMIN (GLUCOPHAGE) 500 MG tablet, Take 1 tablet (500 mg total) by mouth 3 (three) times daily., Disp: 90 tablet, Rfl: 5   methocarbamol (ROBAXIN) 500 MG tablet, Take 1 tablet (500 mg  total) by mouth every 6 (six) hours as needed for muscle spasms., Disp: 20 tablet, Rfl: 0   Misc Natural Products (MAGIC MUSHROOM MIX) CAPS, Take 1 tablet by mouth daily., Disp: , Rfl:    Multiple Vitamins-Minerals (MULTIVITAMIN WITH MINERALS) tablet, Take 1 tablet by mouth daily., Disp: , Rfl:    NP THYROID 60 MG tablet, Take 60 mg by mouth daily., Disp: , Rfl:    ondansetron (ZOFRAN) 8 MG tablet, Take 1 tablet (8 mg total) by mouth every 8 (eight) hours as needed for nausea or vomiting., Disp: 20 tablet, Rfl: 0   OVER THE COUNTER MEDICATION, Take 2 capsules by mouth 3 (three) times daily. Protectival, Disp: , Rfl:    polyethylene glycol (MIRALAX / GLYCOLAX) 17 g packet, Take 17 g by mouth daily. (Patient taking differently: Take 17 g by mouth daily  as needed for moderate constipation.), Disp: 14 each, Rfl: 0   senna (SENOKOT) 8.6 MG TABS tablet, Take 1 tablet (8.6 mg total) by mouth daily., Disp: 120 tablet, Rfl: 0   trametinib dimethyl sulfoxide (MEKINIST) 2 MG tablet, Take 1 tablet (2 mg total) by mouth daily. Take 1 hour before or 2 hours after a meal. Store refrigerated in original container., Disp: 30 tablet, Rfl: 3   Zinc 100 MG TABS, Take 100 mg by mouth daily., Disp: , Rfl:   Allergies: No Known Allergies  Past Medical History, Surgical history, Social history, and Family History were reviewed and updated.  Review of Systems: Review of Systems  Constitutional:  Positive for fatigue.  HENT:  Negative.    Eyes: Negative.   Respiratory:  Positive for cough.   Cardiovascular: Negative.   Gastrointestinal: Negative.   Endocrine: Positive for hot flashes.  Genitourinary: Negative.    Musculoskeletal:  Positive for arthralgias and myalgias.  Skin: Negative.   Neurological:  Positive for dizziness and light-headedness.  Hematological: Negative.   Psychiatric/Behavioral: Negative.     Physical Exam:  height is 5' 2"  (1.575 m) and weight is 124 lb 12.8 oz (56.6 kg). Her oral temperature is 98.2 F (36.8 C). Her blood pressure is 105/69 and her pulse is 100. Her respiration is 18 and oxygen saturation is 98%.   Wt Readings from Last 3 Encounters:  04/14/21 124 lb 12.8 oz (56.6 kg)  03/03/21 127 lb (57.6 kg)  02/22/21 123 lb 3.2 oz (55.9 kg)    Physical Exam Vitals reviewed.  HENT:     Head: Normocephalic and atraumatic.  Eyes:     Pupils: Pupils are equal, round, and reactive to light.  Cardiovascular:     Rate and Rhythm: Normal rate and regular rhythm.     Heart sounds: Normal heart sounds.  Pulmonary:     Effort: Pulmonary effort is normal.     Breath sounds: Normal breath sounds.  Abdominal:     General: Bowel sounds are normal.     Palpations: Abdomen is soft.  Musculoskeletal:        General: No tenderness  or deformity. Normal range of motion.     Cervical back: Normal range of motion.  Lymphadenopathy:     Cervical: No cervical adenopathy.  Skin:    General: Skin is warm and dry.     Findings: No erythema or rash.     Comments: Under the left breast at about the 5-6 o'clock position, there is a nodule.  It is mobile.  It is nontender.  Probably measures about 8 x 4 mm.  Neurological:  Mental Status: She is alert and oriented to person, place, and time.  Psychiatric:        Behavior: Behavior normal.        Thought Content: Thought content normal.        Judgment: Judgment normal.     Lab Results  Component Value Date   WBC 9.7 04/14/2021   HGB 9.7 (L) 04/14/2021   HCT 30.1 (L) 04/14/2021   MCV 83.6 04/14/2021   PLT 571 (H) 04/14/2021     Chemistry      Component Value Date/Time   NA 134 (L) 04/14/2021 1136   K 4.1 04/14/2021 1136   CL 96 (L) 04/14/2021 1136   CO2 28 04/14/2021 1136   BUN 17 04/14/2021 1136   CREATININE 0.75 04/14/2021 1136      Component Value Date/Time   CALCIUM 9.5 04/14/2021 1136   ALKPHOS 94 04/14/2021 1136   AST 15 04/14/2021 1136   ALT 10 04/14/2021 1136   BILITOT 0.3 04/14/2021 1136      Impression and Plan: Ms. Asch is a very nice 70 year old white female.  She has 2 separate malignancies.  Her most "active" malignancy is the lung cancer.  Thankfully, she does have the BRAF mutation.  I does have a feeling that were not seeing a response even though the tumor is BRAF mutated and she is on the Mekinist/Tafinlar.  We will have to do the thoracentesis on her.  This will make her feel a little better.  She is more anemic.  I suspect she is by iron deficient because of the decreased MCV.  I told her to take some supplemental iron at home.  I think she will take some beets.  I do not think this is a bad idea.  I also told her to try some vitamin B complex.  Ultimately, I just have a feeling that the only way were going to try to help her  and to try to improve the situation is to do chemotherapy.  I really would like to try to get a specimen to do molecular studies.  She has this nodule under the left breast that we could easily have surgery and remove.  This would definitely give [place] material for molecular analysis.  I know that she is strongly committed to her homeopathic and naturopathic approach.  I told her respect her for this.  We will see about getting a CAT scan set up for her next week.     I called her about the chest x-ray report.  We will set the thoracentesis up tomorrow.    I will plan to try to get her back to see [place] in another couple weeks.   Volanda Napoleon, MD 11/2/20221:51 PM

## 2021-04-15 ENCOUNTER — Ambulatory Visit (HOSPITAL_COMMUNITY)
Admission: RE | Admit: 2021-04-15 | Discharge: 2021-04-15 | Disposition: A | Discharge status: Home / Self Care | Payer: Medicare Other | Source: Ambulatory Visit | Attending: Internal Medicine | Admitting: Internal Medicine

## 2021-04-15 ENCOUNTER — Ambulatory Visit (HOSPITAL_COMMUNITY)
Admission: RE | Admit: 2021-04-15 | Discharge: 2021-04-15 | Disposition: A | Discharge status: Home / Self Care | Payer: Medicare Other | Source: Ambulatory Visit | Attending: Hematology & Oncology | Admitting: Hematology & Oncology

## 2021-04-15 ENCOUNTER — Encounter: Payer: Self-pay | Admitting: Internal Medicine

## 2021-04-15 ENCOUNTER — Encounter: Payer: Self-pay | Admitting: *Deleted

## 2021-04-15 DIAGNOSIS — J910 Malignant pleural effusion: Secondary | ICD-10-CM | POA: Insufficient documentation

## 2021-04-15 DIAGNOSIS — C3492 Malignant neoplasm of unspecified part of left bronchus or lung: Secondary | ICD-10-CM | POA: Insufficient documentation

## 2021-04-15 DIAGNOSIS — Z9889 Other specified postprocedural states: Secondary | ICD-10-CM

## 2021-04-15 HISTORY — PX: IR THORACENTESIS ASP PLEURAL SPACE W/IMG GUIDE: IMG5380

## 2021-04-15 LAB — CANCER ANTIGEN 27.29: CA 27.29: 71.4 U/mL — ABNORMAL HIGH (ref 0.0–38.6)

## 2021-04-15 LAB — CEA (IN HOUSE-CHCC): CEA (CHCC-In House): 1.19 ng/mL (ref 0.00–5.00)

## 2021-04-15 IMAGING — CR DG CHEST 1V
1 series · 1 of 1 positions shown · non-contrast
Comparison: [DATE]

CLINICAL DATA: Thoracentesis

EXAM:
CHEST  1 VIEW

[chest pa]
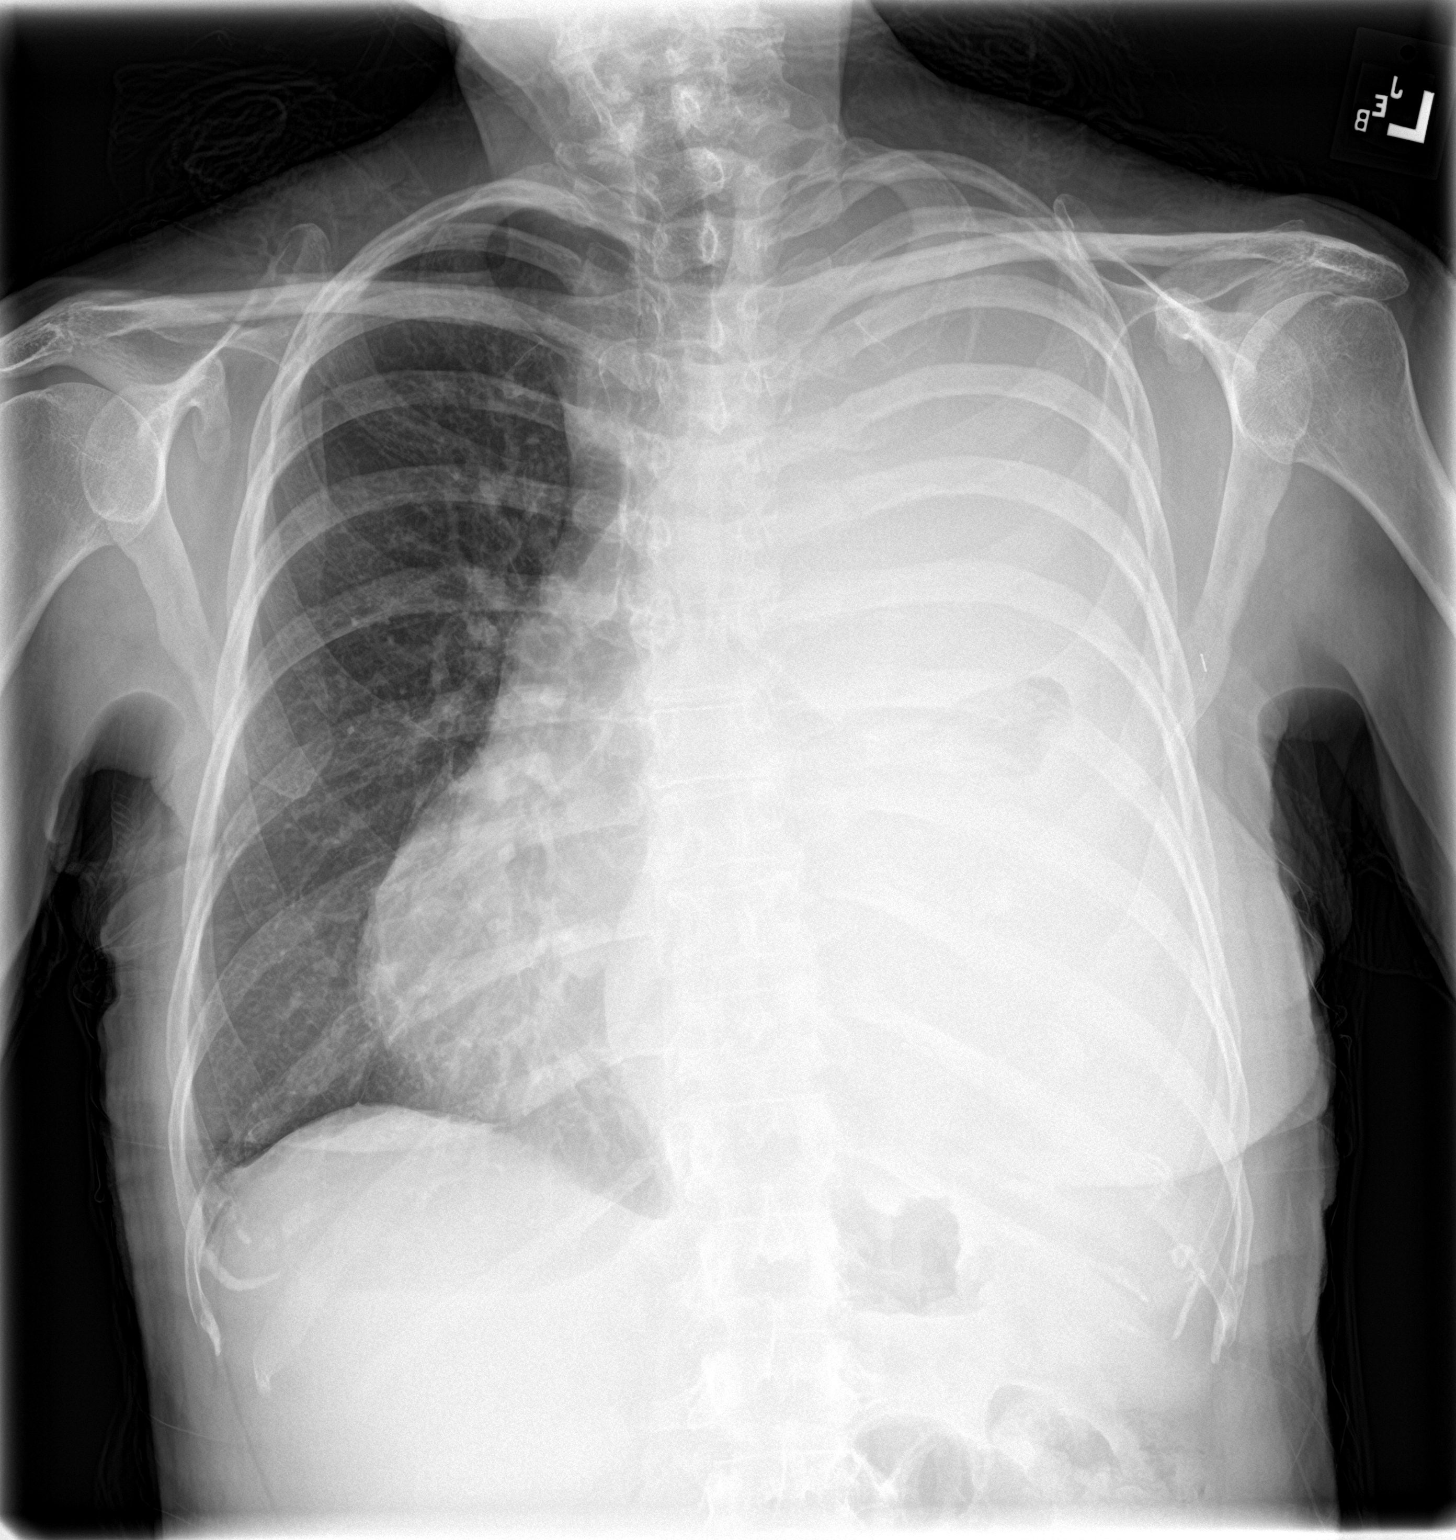

[1 of 1 positions shown; findings below may reference images not displayed]

FINDINGS: No appreciable change in a large left pleural effusion with
essentially complete atelectasis or consolidation of the left lung
and rightward shift of the mediastinal contents. No pneumothorax.
The right lung is normally aerated.
IMPRESSION: No appreciable change in a large left pleural effusion with
essentially complete atelectasis or consolidation of the left lung
and rightward shift of the mediastinal contents. No pneumothorax
following thoracentesis.

## 2021-04-15 IMAGING — US IR THORACENTESIS ASP PLEURAL SPACE W/IMG GUIDE
1 series · 2 of 2 positions shown · non-contrast
Comparison: none

INDICATION: Patient with history of metastatic lung cancer, dyspnea, recurrent
left pleural effusion. Request received for therapeutic left
thoracentesis.

[Series 1: us thoracentesis asp pleural s mc & wl · 2 of 2 slices shown]
[im 1/2]
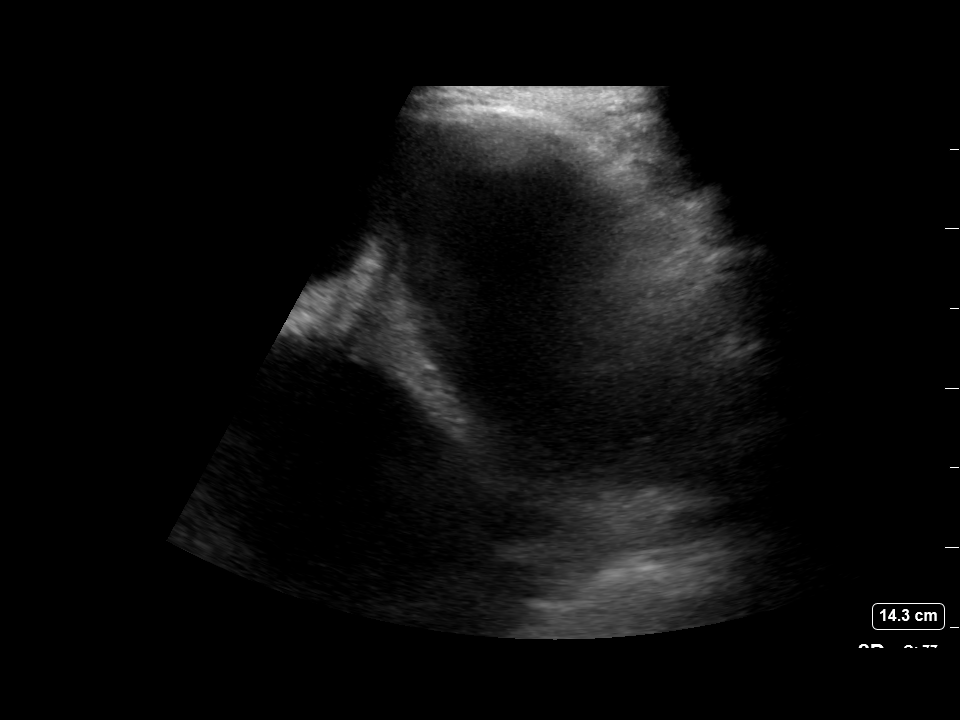
[im 2/2]
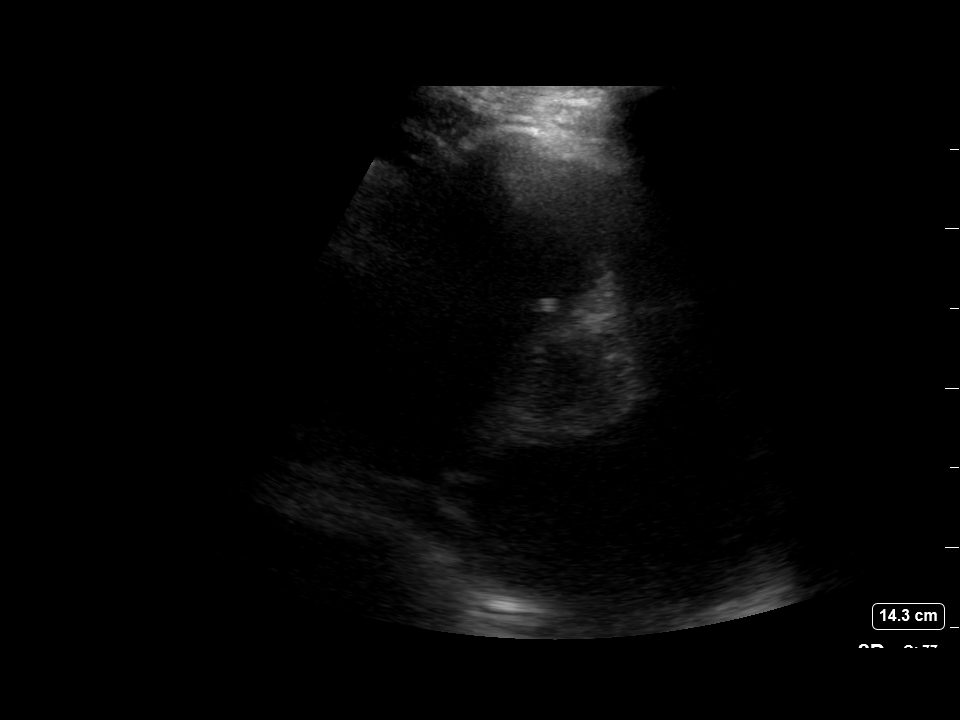

[2 of 2 positions shown; findings below may reference images not displayed]

EXAM:
ULTRASOUND GUIDED THERAPEUTIC THORACENTESIS

MEDICATIONS:
10ml 1% lidocaine

COMPLICATIONS:
None immediate.

PROCEDURE:
An ultrasound guided thoracentesis was thoroughly discussed with the
patient and questions answered. The benefits, risks, alternatives
and complications were also discussed. The patient understands and
wishes to proceed with the procedure. Written consent was obtained.

Ultrasound was performed to localize and mark an adequate pocket of
fluid in the left chest. The area was then prepped and draped in the
normal sterile fashion. 1% Lidocaine was used for local anesthesia.
Under ultrasound guidance a 6 Fr Safe-T-Centesis catheter was
introduced. Thoracentesis was performed. The catheter was removed
and a dressing applied.
FINDINGS: A total of approximately 1.2 L of cloudy, dark yellow fluid was
removed.
IMPRESSION: Successful ultrasound guided left thoracentesis yielding 1.2 L of
pleural fluid.

## 2021-04-15 MED ORDER — LIDOCAINE HCL 1 % IJ SOLN
INTRAMUSCULAR | Status: AC
  Filled 2021-04-15: qty 20

## 2021-04-15 MED ORDER — LIDOCAINE HCL (PF) 1 % IJ SOLN
INTRAMUSCULAR | Status: DC | PRN
  Administered 2021-04-15: 5 mL

## 2021-04-15 NOTE — Progress Notes (Signed)
Scheduled thoracentesis for today at 2pm. Called patient and reviewed appointment time, date and location. She knows to be at the hospital by 1:30p.   Oncology Nurse Navigator Documentation  Oncology Nurse Navigator Flowsheets 04/15/2021  Abnormal Finding Date -  Confirmed Diagnosis Date -  Diagnosis Status -  Navigator Follow Up Date: 05/05/2021  Navigator Follow Up Reason: Follow-up Appointment  Navigation Complete Date: -  Post Navigation: Continue to Follow Patient? -  Reason Not Navigating Patient: -  Navigator Location [facility]  Referral Date to RadOnc/MedOnc -  Navigator Encounter Type Appt/Treatment Plan Review;Telephone  Telephone Appt Confirmation/Clarification;Education;Outgoing Call  [facility] Date -  Multidisiplinary Clinic Type -  Patient Visit Type MedOnc  Treatment Phase Active Tx  Barriers/Navigation Needs Coordination of Care;Education  Education Other  Interventions Coordination of Care;Education  Acuity Level 2-Minimal Needs (1-2 Barriers Identified)  Coordination of Care Appts;Radiology  Education Method Verbal;Teach-back  Support Groups/Services Friends and Family  Time Spent with Patient 30

## 2021-04-15 NOTE — Progress Notes (Signed)
Patient shows signs of continued progression. She is reluctant to start any treatment as she is currently treating with homeopathic approaches. She will require some additional work up. Will work on scheduling these once orders are placed.   Oncology Nurse Navigator Documentation  Oncology Nurse Navigator Flowsheets 04/14/2021  Abnormal Finding Date -  Confirmed Diagnosis Date -  Diagnosis Status -  Navigator Follow Up Date: 04/15/2021  Navigator Follow Up Reason: Appointment Review  Navigation Complete Date: -  Post Navigation: Continue to Follow Patient? -  Reason Not Navigating Patient: -  Navigator Location [facility]  Referral Date to RadOnc/MedOnc -  Navigator Encounter Type Appt/Treatment Plan Review  Telephone -  [facility] Date -  Multidisiplinary Clinic Type -  Patient Visit Type MedOnc  Treatment Phase Active Tx  Barriers/Navigation Needs Coordination of Care;Education  Education -  Interventions None Required  Acuity Level 2-Minimal Needs (1-2 Barriers Identified)  Coordination of Care -  Education Method -  Support Groups/Services Friends and Family  Time Spent with Patient 15

## 2021-04-15 NOTE — Procedures (Signed)
PROCEDURE SUMMARY:  Successful US guided therapeutic left thoracentesis. Yielded 1.2 L of cloudy, dark yellow fluid. Pt tolerated procedure well. No immediate complications.  Specimen not sent for labs. CXR ordered.  EBL < 1 mL  Tyson Alias, AGNP 04/15/2021 3:01 PM

## 2021-04-16 ENCOUNTER — Encounter: Payer: Self-pay | Admitting: Internal Medicine

## 2021-04-16 LAB — CYTOLOGY - NON PAP

## 2021-04-20 ENCOUNTER — Encounter: Payer: Self-pay | Admitting: *Deleted

## 2021-04-20 NOTE — Progress Notes (Signed)
Per Dr Marin Olp, request for [facility] testing sent on specimen [phone]  Oncology Nurse Navigator Documentation  Oncology Nurse Navigator Flowsheets 04/20/2021  Abnormal Finding Date -  Confirmed Diagnosis Date -  Diagnosis Status -  Navigator Follow Up Date: 05/05/2021  Navigator Follow Up Reason: Follow-up Appointment  Navigation Complete Date: -  Post Navigation: Continue to Follow Patient? -  Reason Not Navigating Patient: -  Navigator Location [facility]  Referral Date to RadOnc/MedOnc -  Navigator Encounter Type Molecular Studies  Telephone -  [facility] Date -  Multidisiplinary Clinic Type -  Patient Visit Type MedOnc  Treatment Phase Active Tx  Barriers/Navigation Needs Coordination of Care;Education  Education -  Interventions Coordination of Care  Acuity Level 2-Minimal Needs (1-2 Barriers Identified)  Coordination of Care Pathology  Education Method -  Support Groups/Services Friends and Family  Time Spent with Patient 15

## 2021-04-23 ENCOUNTER — Telehealth: Payer: Self-pay | Admitting: *Deleted

## 2021-04-23 ENCOUNTER — Other Ambulatory Visit: Payer: Self-pay | Admitting: Hematology & Oncology

## 2021-04-23 ENCOUNTER — Other Ambulatory Visit: Payer: Self-pay | Admitting: *Deleted

## 2021-04-23 MED ORDER — DABRAFENIB MESYLATE 75 MG PO CAPS: 150.0000 mg | ORAL_CAPSULE | Freq: Two times a day (BID) | ORAL | 3 refills | Status: DC

## 2021-04-23 NOTE — Telephone Encounter (Signed)
Call received from patient stating that she would like to try and increase her dose of Taflinar back to 150 mg twice a day.  Pt states that she is currently taking Taflinar 100 mg twice a day.  Dr. Marin Olp notified and is ok with pt increasing Taflinar back to 150 mg BID.  New prescription for Taflinar 150 mg BID sent to [facility].  Pt notified that Dr. Marin Olp is OK with her increasing the Taflinar to 150 mg BID and that prescription has been sent. Pt is appreciative of assistance and has no further questions at this time.

## 2021-04-26 ENCOUNTER — Other Ambulatory Visit (HOSPITAL_COMMUNITY): Payer: Self-pay

## 2021-04-28 ENCOUNTER — Other Ambulatory Visit: Payer: Self-pay | Admitting: *Deleted

## 2021-04-28 DIAGNOSIS — Z170 Estrogen receptor positive status [ER+]: Secondary | ICD-10-CM

## 2021-04-28 MED ORDER — LETROZOLE 2.5 MG PO TABS: 2.5000 mg | ORAL_TABLET | Freq: Every day | ORAL | 2 refills | Status: DC

## 2021-04-29 ENCOUNTER — Other Ambulatory Visit: Payer: Self-pay

## 2021-04-29 DIAGNOSIS — C7931 Secondary malignant neoplasm of brain: Secondary | ICD-10-CM

## 2021-04-30 ENCOUNTER — Telehealth: Payer: Self-pay | Admitting: *Deleted

## 2021-04-30 ENCOUNTER — Other Ambulatory Visit: Payer: Self-pay | Admitting: *Deleted

## 2021-04-30 DIAGNOSIS — J90 Pleural effusion, not elsewhere classified: Secondary | ICD-10-CM

## 2021-04-30 NOTE — Telephone Encounter (Signed)
Call received from patient stating that she feels she needs another thoracentesis because she is experiencing SOB.  Dr Marin Olp notified.  Orders placed for thoracentesis

## 2021-05-03 ENCOUNTER — Ambulatory Visit (HOSPITAL_COMMUNITY)
Admission: RE | Admit: 2021-05-03 | Discharge: 2021-05-03 | Disposition: A | Discharge status: Home / Self Care | Payer: Medicare Other | Source: Ambulatory Visit | Attending: Hematology & Oncology | Admitting: Hematology & Oncology

## 2021-05-03 ENCOUNTER — Other Ambulatory Visit (HOSPITAL_COMMUNITY): Payer: Self-pay

## 2021-05-03 ENCOUNTER — Telehealth: Payer: Self-pay | Admitting: *Deleted

## 2021-05-03 ENCOUNTER — Ambulatory Visit (HOSPITAL_COMMUNITY)
Admission: RE | Admit: 2021-05-03 | Discharge: 2021-05-03 | Disposition: A | Discharge status: Home / Self Care | Payer: Medicare Other | Source: Ambulatory Visit | Attending: Physician Assistant | Admitting: Physician Assistant

## 2021-05-03 ENCOUNTER — Other Ambulatory Visit: Payer: Self-pay

## 2021-05-03 DIAGNOSIS — J90 Pleural effusion, not elsewhere classified: Secondary | ICD-10-CM | POA: Insufficient documentation

## 2021-05-03 DIAGNOSIS — Z9889 Other specified postprocedural states: Secondary | ICD-10-CM

## 2021-05-03 HISTORY — PX: IR THORACENTESIS ASP PLEURAL SPACE W/IMG GUIDE: IMG5380

## 2021-05-03 IMAGING — DX DG CHEST 1V
1 series · 1 of 1 positions shown · non-contrast
Comparison: [DATE]

CLINICAL DATA: Pleural effusion post LEFT thoracentesis, history
diabetes mellitus, lung cancer

EXAM:
CHEST  1 VIEW

[w chest pa]
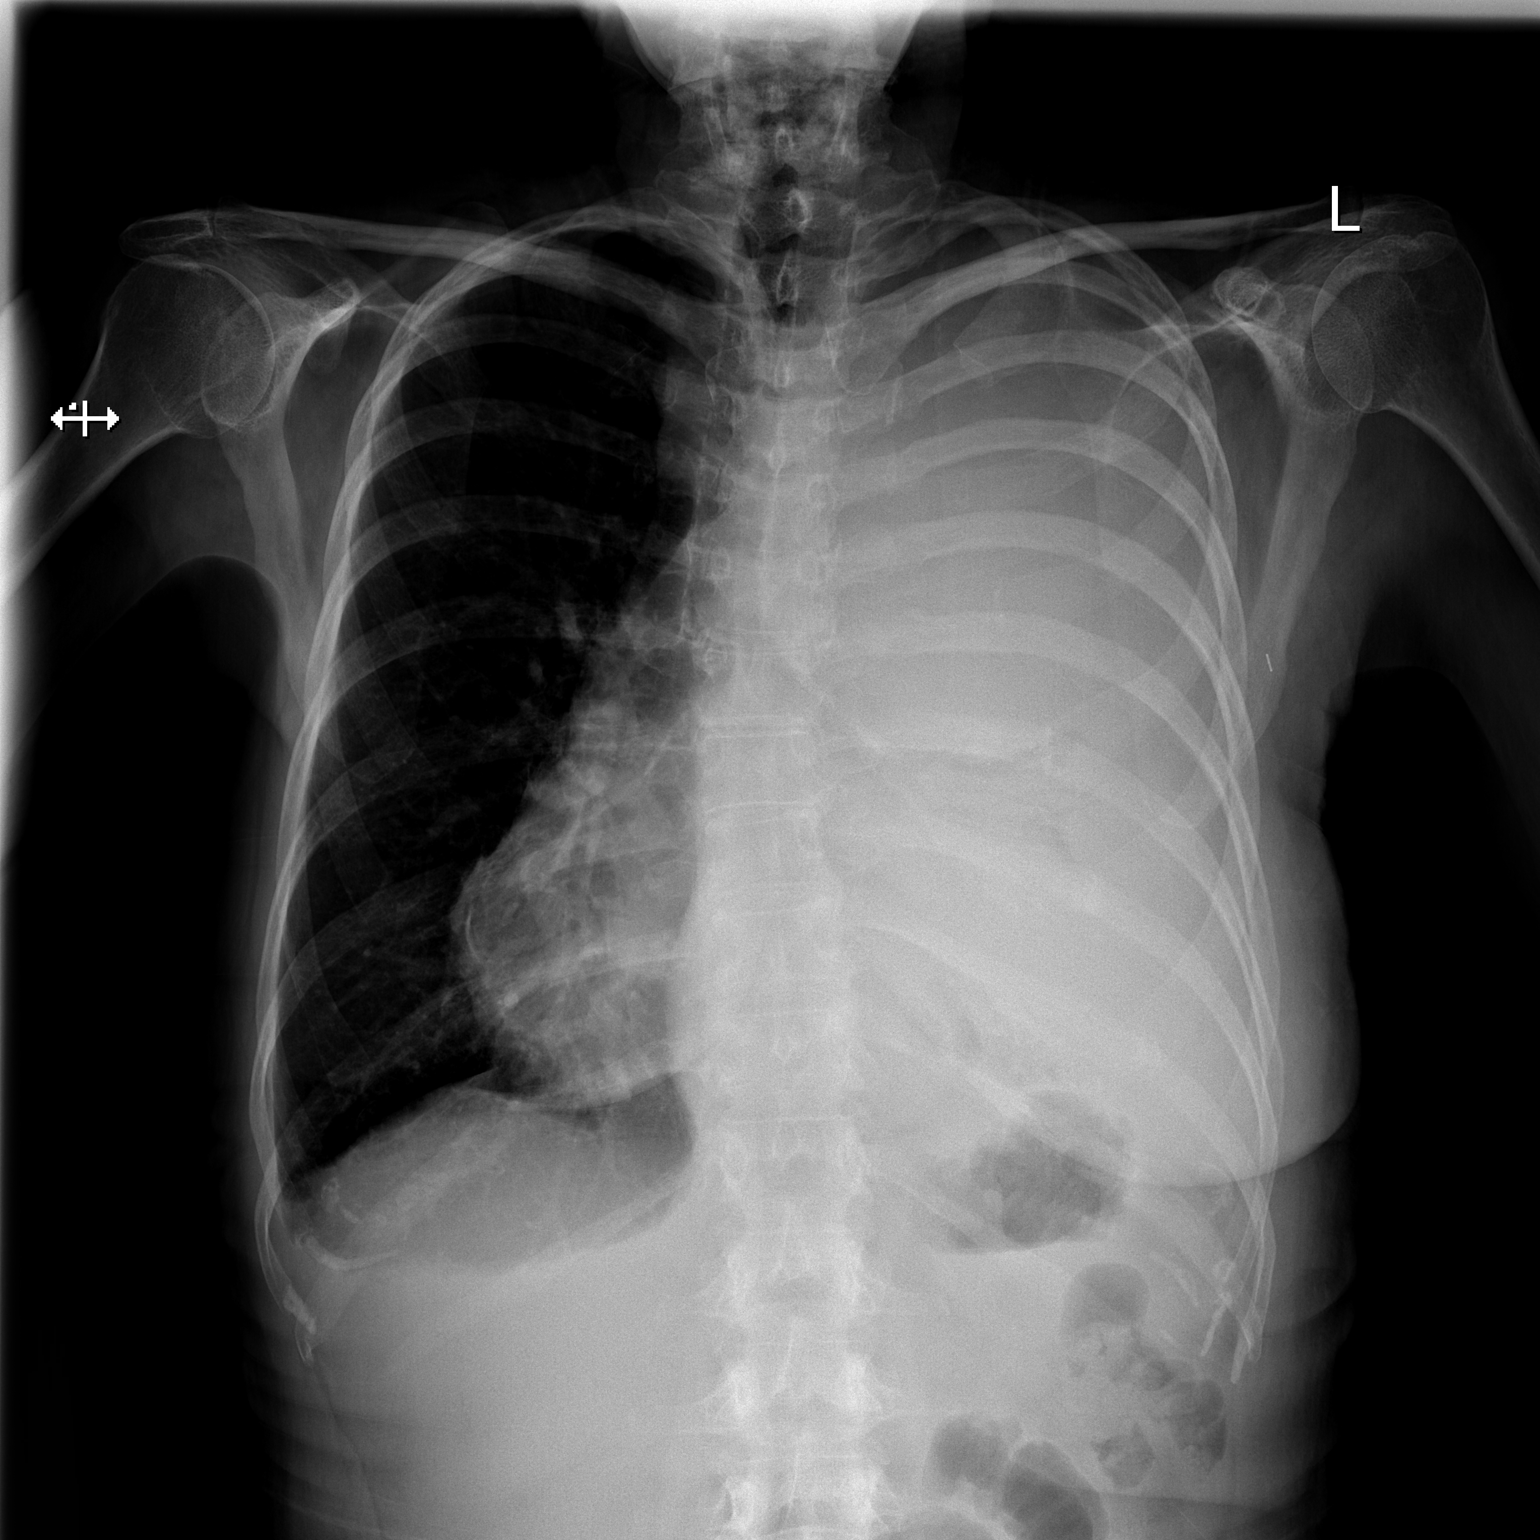

[1 of 1 positions shown; findings below may reference images not displayed]

FINDINGS: Complete opacification of the LEFT hemithorax by combination of
pleural effusion and atelectasis versus consolidation of LEFT lung.

Slight mediastinal shift LEFT to RIGHT.

Underlying mass LEFT chest not excluded.

Heart size stable.

RIGHT lung hyperinflated but clear.

No pneumothorax or acute osseous findings.
IMPRESSION: No pneumothorax following LEFT thoracentesis.

Complete opacification of the LEFT hemithorax by persistent large
LEFT pleural effusion combined with atelectasis versus consolidation
of the LEFT lung, underlying abnormalities not excluded.

## 2021-05-03 IMAGING — US IR THORACENTESIS ASP PLEURAL SPACE W/IMG GUIDE
1 series · 5 of 5 positions shown · non-contrast
Comparison: none

INDICATION: Patient with history of metastatic lung cancer, recurrent left
pleural effusion. Request to IR for therapeutic thoracentesis.

[Series 1: ir (person_name)/(person_name) · 5 of 5 slices shown]
[im 1/5]
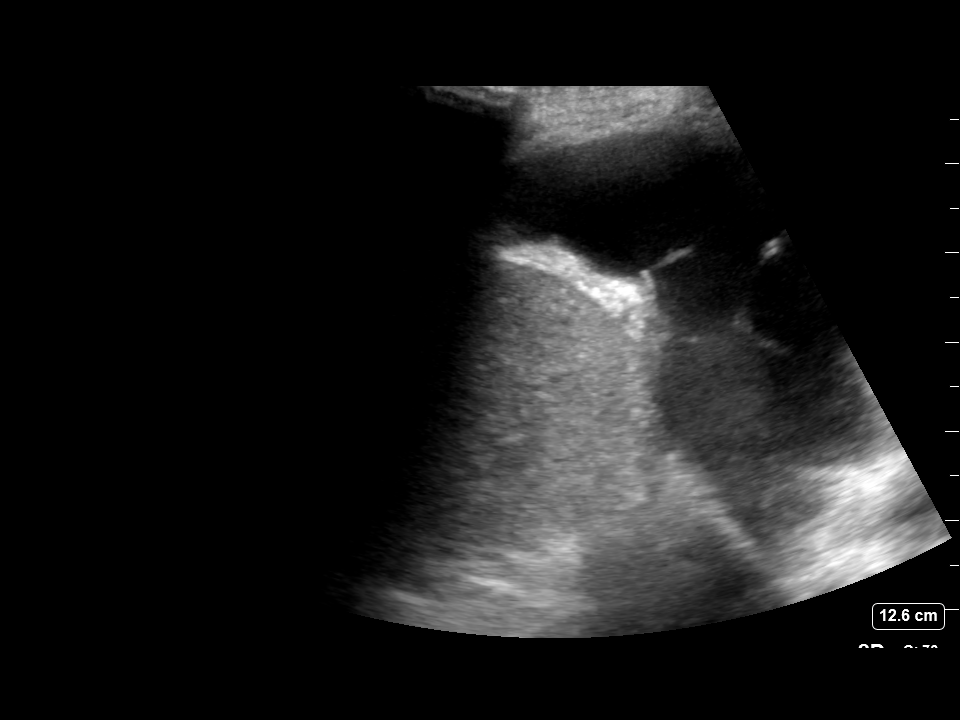
[im 2/5]
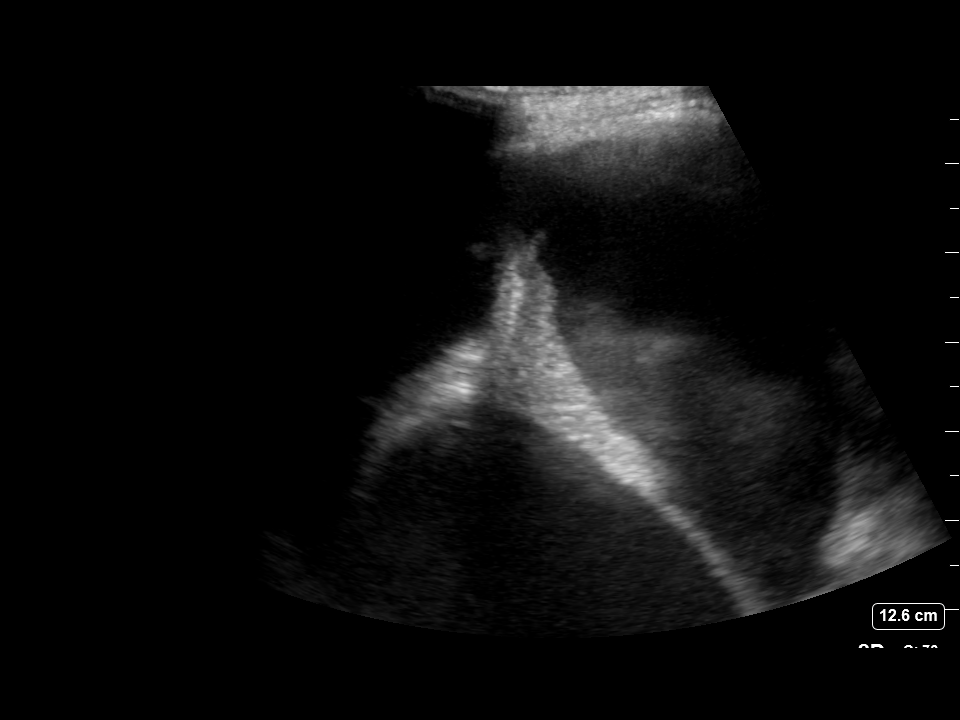
[im 3/5]
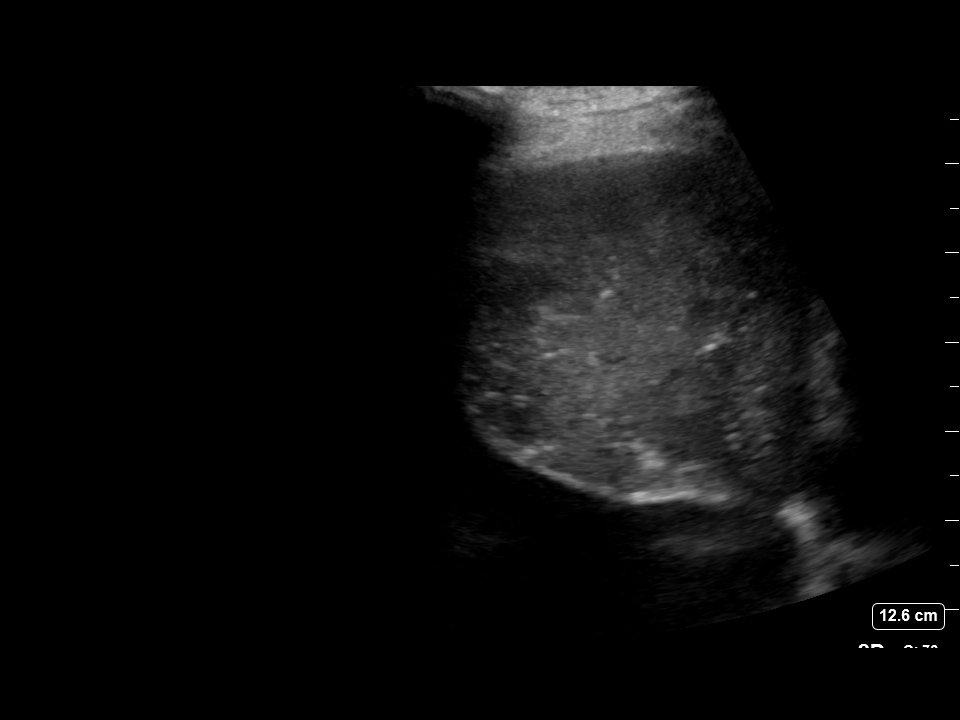
[im 4/5]
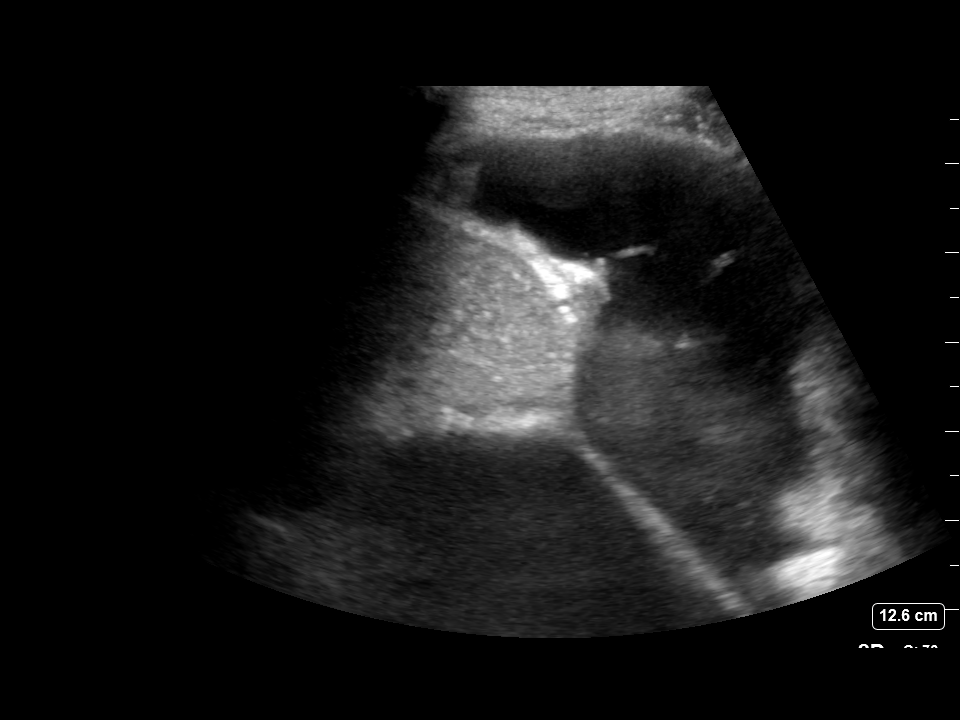
[im 5/5]
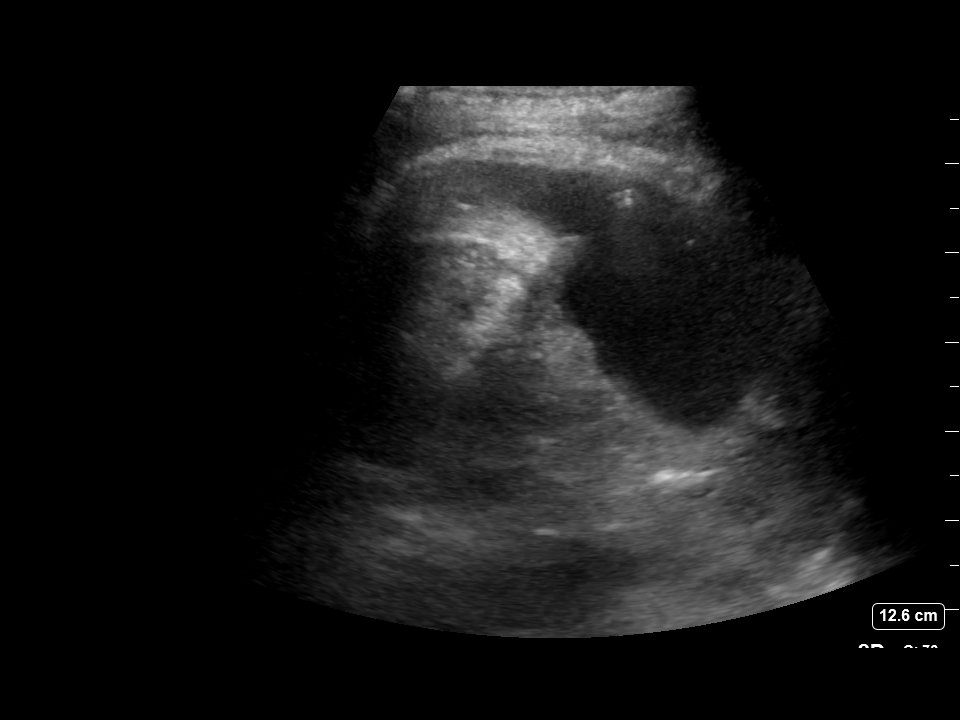

[5 of 5 positions shown; findings below may reference images not displayed]

EXAM:
ULTRASOUND GUIDED LEFT THORACENTESIS

MEDICATIONS:
5 mL% lidocaine

COMPLICATIONS:
None immediate.

PROCEDURE:
An ultrasound guided thoracentesis was thoroughly discussed with the
patient and questions answered. The benefits, risks, alternatives
and complications were also discussed. The patient understands and
wishes to proceed with the procedure. Written consent was obtained.

Ultrasound was performed to localize and mark an adequate pocket of
fluid in the left chest. The area was then prepped and draped in the
normal sterile fashion. 1% Lidocaine was used for local anesthesia.
Under ultrasound guidance a 6 Fr Safe-T-Centesis catheter was
introduced. Thoracentesis was performed. The catheter was removed
and a dressing applied.
FINDINGS: A total of approximately 1.4 L of hazy yellow fluid was removed.
IMPRESSION: Successful ultrasound guided left thoracentesis yielding 1.4 L of
pleural fluid.

Read by HELBERS

## 2021-05-03 MED ORDER — LIDOCAINE HCL 1 % IJ SOLN
INTRAMUSCULAR | Status: AC
  Filled 2021-05-03: qty 20

## 2021-05-03 MED ORDER — LIDOCAINE HCL (PF) 1 % IJ SOLN
INTRAMUSCULAR | Status: DC | PRN
  Administered 2021-05-03: 5 mL

## 2021-05-03 NOTE — Telephone Encounter (Signed)
Message received from patient stating that she needs a thoracentesis today. Central scheduling called by this RN and thoracentesis can be done today at [facility] at 1:00PM.  Call placed back to patient and patient notified to be at [facility] at 12:45PM today for thoracentesis today at 1:00PM.  Pt appreciative of call back and states that she will be at [facility] at 12:45PM for thoracentesis today.

## 2021-05-03 NOTE — Procedures (Signed)
PROCEDURE SUMMARY:  Successful image-guided left thoracentesis. Yielded 1.4 liters of hazy yellow fluid. Patient tolerated procedure well. EBL: Zero No immediate complications.  Specimen was not sent for labs. Post procedure CXR shows no pneumothorax.  Please see imaging section of Epic for full dictation.  Joaquim Nam PA-C 05/03/2021 1:38 PM

## 2021-05-05 ENCOUNTER — Inpatient Hospital Stay (HOSPITAL_BASED_OUTPATIENT_CLINIC_OR_DEPARTMENT_OTHER): Payer: Medicare Other | Admitting: Hematology & Oncology

## 2021-05-05 ENCOUNTER — Inpatient Hospital Stay: Payer: Medicare Other

## 2021-05-05 ENCOUNTER — Other Ambulatory Visit: Payer: Self-pay

## 2021-05-05 ENCOUNTER — Encounter (HOSPITAL_BASED_OUTPATIENT_CLINIC_OR_DEPARTMENT_OTHER): Payer: Self-pay

## 2021-05-05 ENCOUNTER — Ambulatory Visit (HOSPITAL_BASED_OUTPATIENT_CLINIC_OR_DEPARTMENT_OTHER)
Admission: RE | Admit: 2021-05-05 | Discharge: 2021-05-05 | Disposition: A | Discharge status: Home / Self Care | Payer: Medicare Other | Source: Ambulatory Visit | Attending: Hematology & Oncology | Admitting: Hematology & Oncology

## 2021-05-05 ENCOUNTER — Encounter: Payer: Self-pay | Admitting: Hematology & Oncology

## 2021-05-05 VITALS — BP 111/63 | HR 98 | Temp 98.2°F | Resp 19 | Wt 116.0 lb

## 2021-05-05 DIAGNOSIS — Z170 Estrogen receptor positive status [ER+]: Secondary | ICD-10-CM | POA: Diagnosis not present

## 2021-05-05 DIAGNOSIS — C3492 Malignant neoplasm of unspecified part of left bronchus or lung: Secondary | ICD-10-CM | POA: Diagnosis not present

## 2021-05-05 DIAGNOSIS — C50512 Malignant neoplasm of lower-outer quadrant of left female breast: Secondary | ICD-10-CM

## 2021-05-05 IMAGING — CT CT CHEST-ABD-PELV W/ CM
2 of 5 series · 12 of 36 positions shown, 14 images · IV contrast (Omnipaque)
Comparison: Comparison is made with [DATE].

CLINICAL DATA: Metastatic non-small cell lung cancer in a
70-year-old female. Assess treatment response.

EXAM:
CT CHEST, ABDOMEN, AND PELVIS WITH CONTRAST
TECHNIQUE: Multidetector CT imaging of the chest, abdomen and pelvis was
performed following the standard protocol during bolus
administration of intravenous contrast.
CONTRAST:  100mL OMNIPAQUE IOHEXOL 300 MG/ML  SOLN

[Series 2: cap with 2 · axial · 0.75mm/px · z∈[-561,-76]mm · 9 of 123 slices shown, 11 images]
[im 13/123  mediastinal]
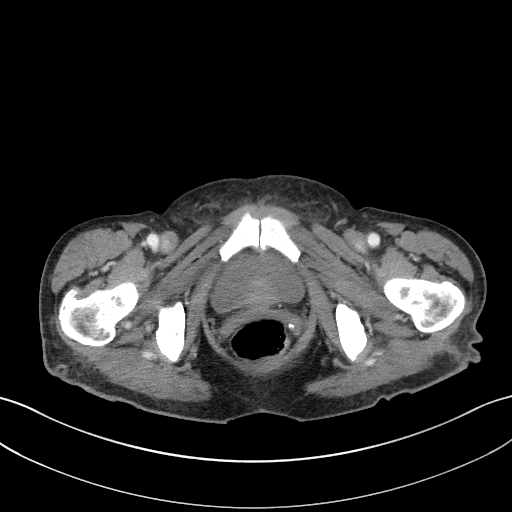
[im 13/123  bone]
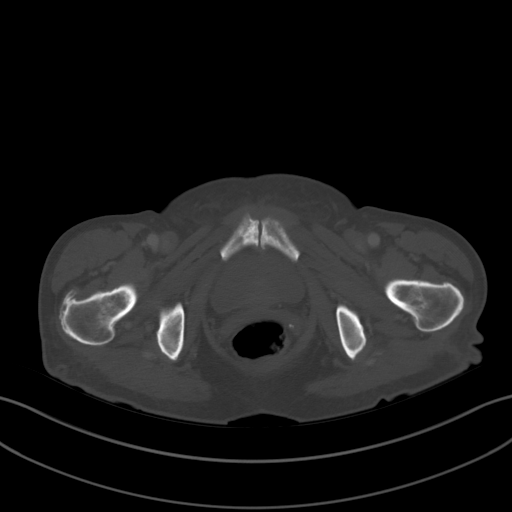
[im 25/123  mediastinal]
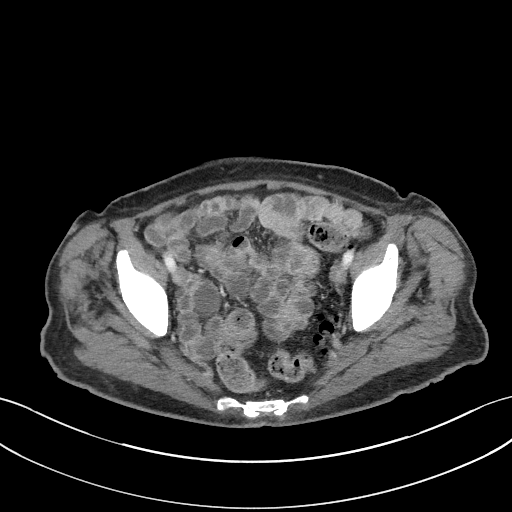
[im 37/123  mediastinal]
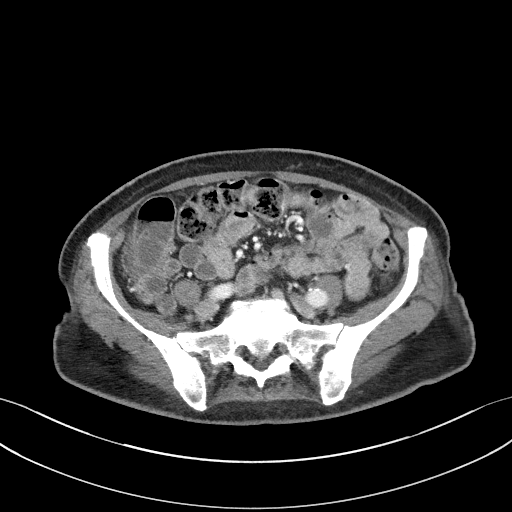
[im 49/123  mediastinal]
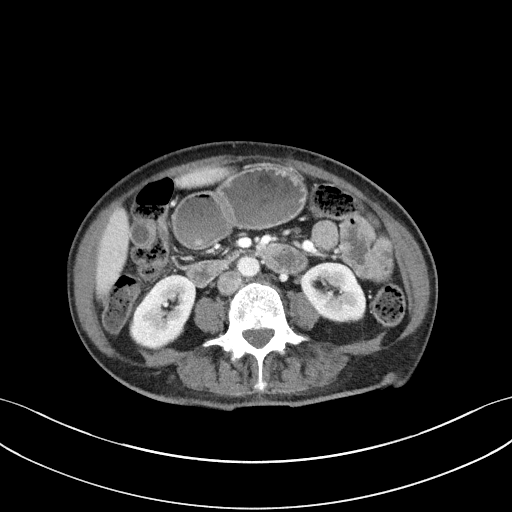
[im 62/123  mediastinal]
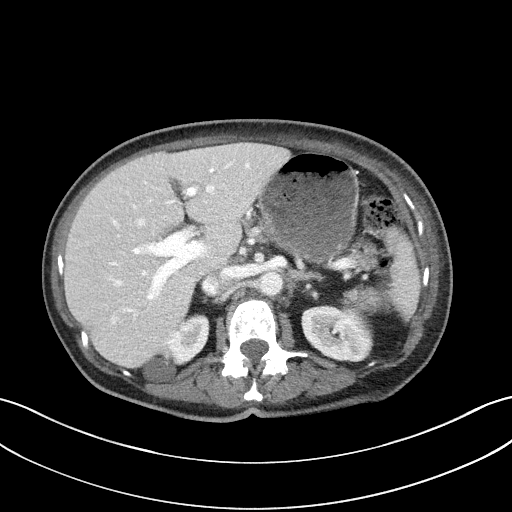
[im 74/123  mediastinal]
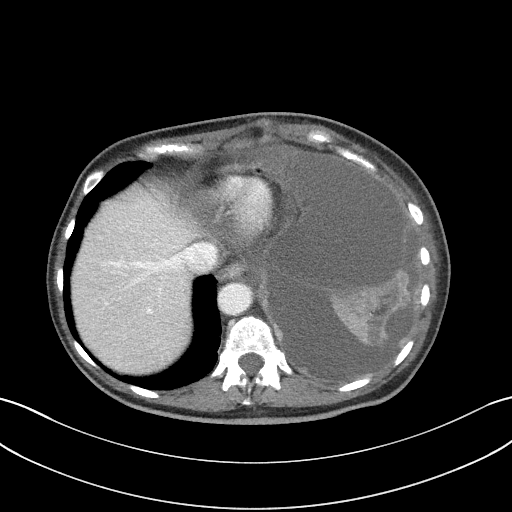
[im 86/123  mediastinal]
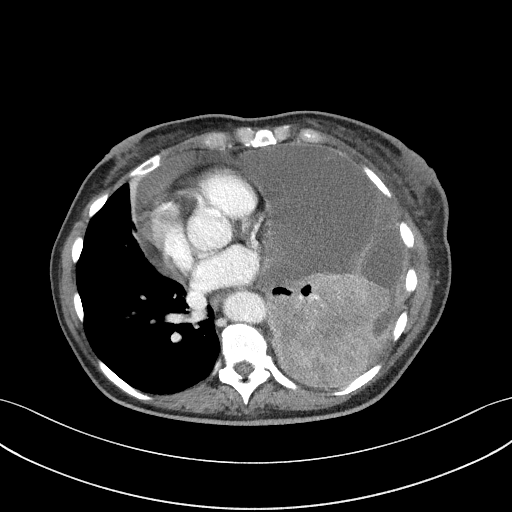
[im 98/123  mediastinal]
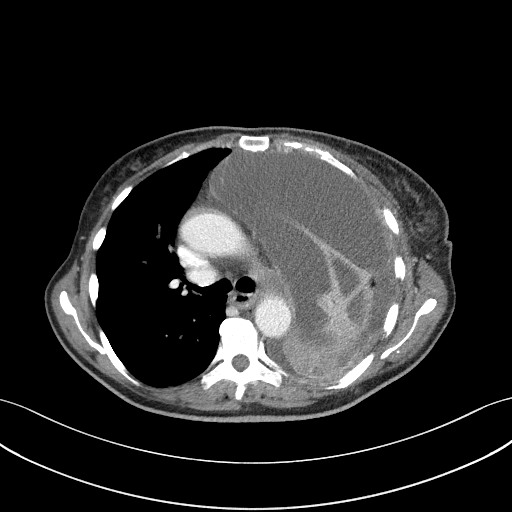
[im 110/123  mediastinal]
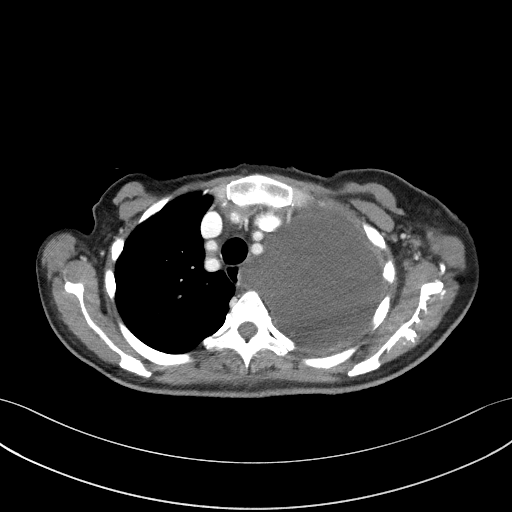
[im 110/123  bone]
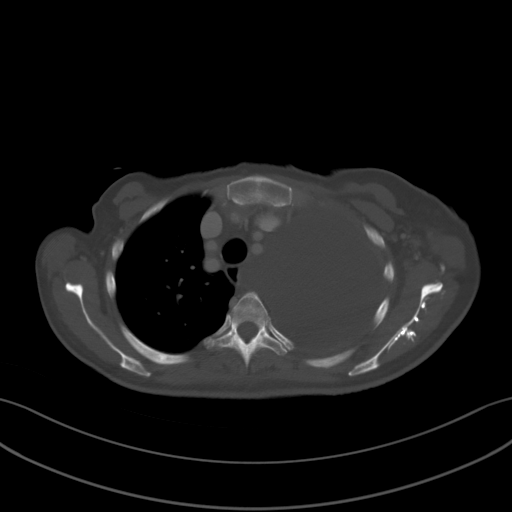

[Series 5: coronals · coronal · 0.73mm/px · 3 of 122 slices shown]
[im 25/122  mediastinal]
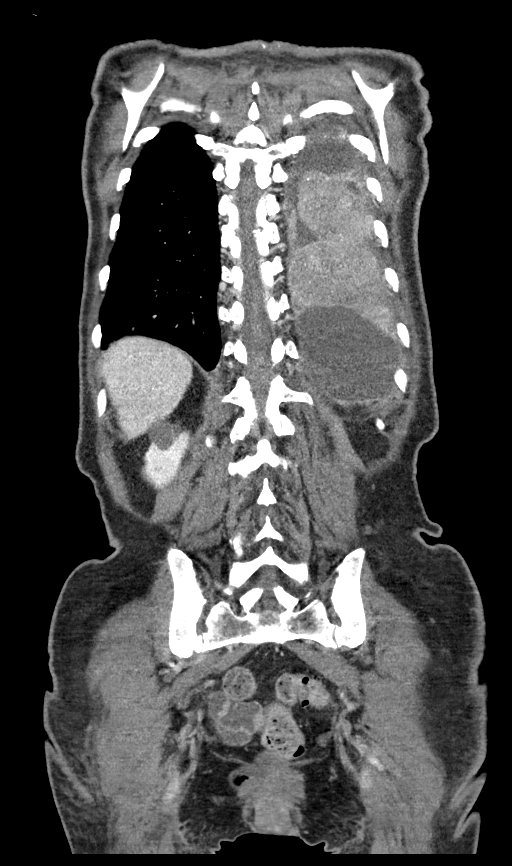
[im 49/122  mediastinal]
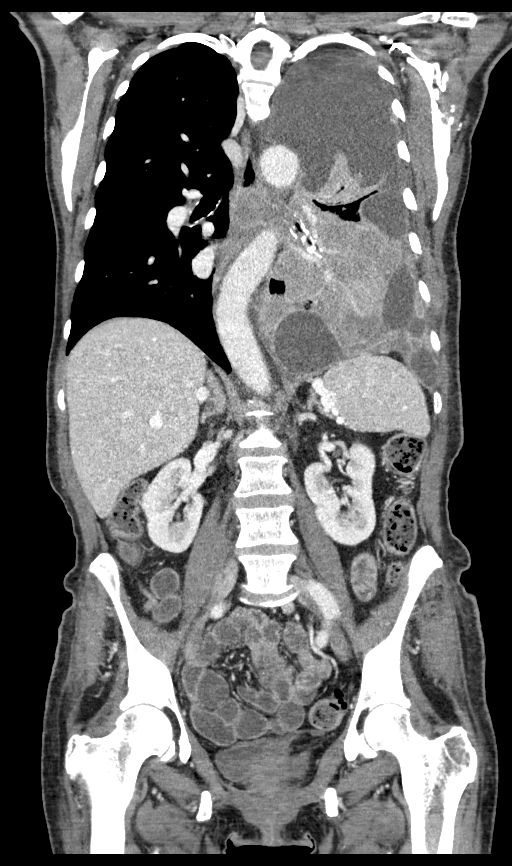
[im 73/122  mediastinal]
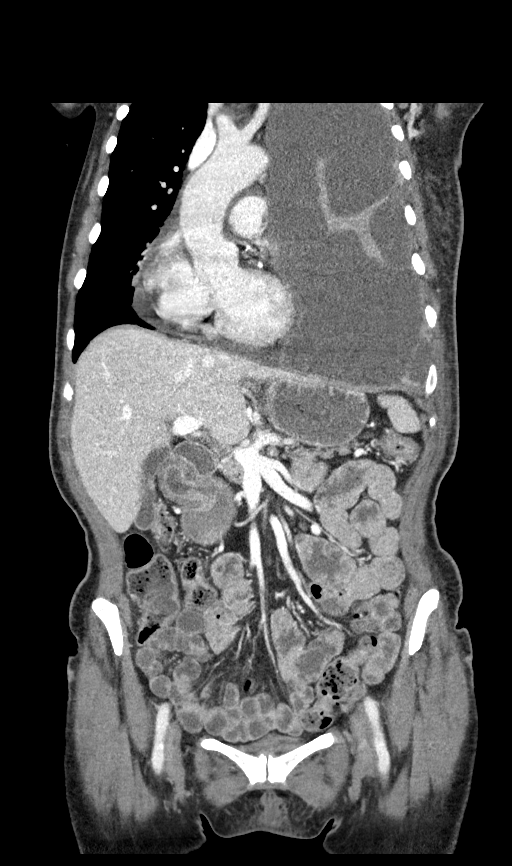

[12 of 36 positions shown; findings below may reference images not displayed]

FINDINGS: CT CHEST FINDINGS

Cardiovascular: Heart size is normal. Aortic caliber is normal.
Increased shift of mediastinal structures from LEFT to RIGHT when
compared to the prior study due to enlarging LEFT-sided pleural
effusion with malignant features. Small to moderate volume
pericardial effusion is slightly increased as well more
circumferential, preferentially along the RIGHT heart on the
previous study.

Mediastinum/Nodes: Subcarinal adenopathy difficult to separate from
the esophagus on today's study, grossly similar approximately 16 mm
short axis. No signs of new adenopathy

Lungs/Pleura: LEFT lower lobe mass 9.0 x 7.7 cm (image 36/2)
previously approximately 8.0 x 6.7 cm. Increasing pleural fluid in
the LEFT upper chest with complete collapse of the LEFT upper lobe.
Increasing pleural based nodularity along the LEFT upper chest
(image [DATE]) 15 x 7 mm. Scattered smaller areas of nodularity are
new as well.

Near confluent eccentric 180 degree pleural thickening in the LEFT
mid chest (image 46/2) greatest thickness ranging between 8 and 12
mm not seen on previous imaging. This is contiguous with the
dominant mass sub pulmonic fluid may be decreased in volume with the
increase in volume in the upper lobe but there is further septation
with respect to pleural fluid in the LEFT chest.

In the LEFT costodiaphragmatic recess inferiorly is another example
of new nodularity (image 56/2) 13 x 10 mm.

Musculoskeletal: See below for full musculoskeletal details.

CT ABDOMEN PELVIS FINDINGS

Hepatobiliary: No focal, suspicious hepatic lesion. No
pericholecystic stranding. No biliary duct dilation. Portal vein is
patent.

Pancreas: Normal, without mass, inflammation or ductal dilatation.

Spleen: Normal spleen.

Adrenals/Urinary Tract: Adrenal glands are normal. Symmetric renal
enhancement with signs of mild renal cortical scarring on the LEFT.
No hydronephrosis. Urinary bladder with smooth contours.

Stomach/Bowel: Normal appendix.  No acute gastrointestinal findings.

Vascular/Lymphatic: Mild aortic atherosclerosis in the distal
thoracic and throughout the abdominal aorta tracking in the iliac
vessels. No aneurysmal dilation. Smooth contour of the IVC. There is
no gastrohepatic or hepatoduodenal ligament lymphadenopathy. No
retroperitoneal or mesenteric lymphadenopathy.

No pelvic sidewall lymphadenopathy.

Reproductive: Post hysterectomy without signs of adnexal mass

Other: No ascites.

Musculoskeletal: L1 sclerosis with similar appearance to prior
imaging no acute or destructive bone process.
IMPRESSION: Enlarging LEFT-sided pleural effusion with malignant features, now
with complete collapse of the LEFT upper lobe. Only a very small
amount of aerated lung remains visible about the LEFT hilum in the
LEFT chest and despite volume loss with increasing shift of
mediastinal structures from LEFT to RIGHT. Mediastinal shift is
approximately 3-4 cm greater than on previous imaging due to the
enlargement of the LEFT-sided pleural effusion

Increasing pleural nodularity and enlarging central pulmonary mass
indicative of worsening of disease in the chest.

Small to moderate volume pericardial effusion is slightly increased
as well as more circumferential, preferentially along the RIGHT
heart on the previous study.

No evidence of metastatic disease involving the abdomen or pelvis.

L1 bony metastasis with similar appearance.

Aortic atherosclerosis.

These results will be called to the ordering clinician or
representative by the Radiologist Assistant, and communication
documented in the PACS or [REDACTED].

## 2021-05-05 MED ORDER — IOHEXOL 300 MG/ML  SOLN
100.0000 mL | Freq: Once | INTRAMUSCULAR | Status: AC | PRN
  Administered 2021-05-05: 100 mL via INTRAVENOUS

## 2021-05-05 NOTE — Progress Notes (Signed)
Hematology and Oncology Follow Up Visit  Edward Trevino [id number] 1950-11-11 70 y.o. 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 Pickney is back for follow-up.  She did have her CT scan done today.  I do not have the results back.  I did look at the pictures.  Unfortunately, I suspect that she has a collapsed left lung.  She had fluid taken off 2 days ago.  She had 1.4 L of fluid taken off.  I would have thought that this would have opened up the left lung.  We may have to think about doing a bronchoscopy on her to see if she has a bronchial obstruction or if there is anything that can be done if there is a blockage.  We did send off Foundation 1 on the fluid a couple weeks ago.  I just wanted to see if there is any molecular changes.  I have thought about the possibility of using Enhertu.  When I looked back at the [facility] studies that were done.  She had a high level of HER2.  She is on Mekinist and Tafinlar because of lung cancer has the BRAF mutation.  I will make sure she is taking and Femara for her breast cancer.  She does have the nodule in the inner lower portion of the left breast.  She is thinking about having a Pleurx catheter placed because the fluid keeps coming back.  I told her that we really have to try to figure out how to prevent the fluid from coming back.  She still is taking her naturalistic elements to try to help with her cancer.  I think Radiation Oncology is dealing with the CNS disease.  I think she says he is going to have another MRI in early December.  I know this is incredibly challenging.  Her appetite is down a little bit.  She is having no difficulties swallowing.  She  says sometimes food gets stuck.  Thankfully, she is not in any pain.  Overall, I would have to say that her performance status for now is ECOG 2.     Medications:  Current Outpatient Medications:    acetaminophen (TYLENOL) 325 MG tablet, Take 2 tablets (650 mg total) by mouth every 6 (six) hours as needed for mild pain, moderate pain or fever., Disp: 20 tablet, Rfl: 0   Coenzyme Q10 (CO Q 10 PO), Take 300 mg by mouth at bedtime., Disp: , Rfl:    dabrafenib mesylate (TAFINLAR) 75 MG capsule, Take 2 capsules (150 mg total) by mouth 2 (two) times daily. Take on an empty stomach 1 hour before or 2 hours after meals., Disp: 120 capsule, Rfl: 3   DANDELION PO, Take 1 capsule by mouth daily., Disp: , Rfl:    diphenhydrAMINE-zinc acetate (BENADRYL) cream, Apply topically as needed for itching (as needed for itching as labeled)., Disp: 28.4 g, Rfl: 0   dronabinol (MARINOL) 5 MG capsule, Take 1 capsule (5 mg total) by mouth 2 (two) times daily before a meal., Disp: 60 capsule, Rfl: 2   feeding supplement (ENSURE ENLIVE / ENSURE PLUS) LIQD, Take 237 mLs by mouth 3 (three) times daily between meals., Disp: 237 mL, Rfl: 12   ibuprofen (ADVIL) 200  MG tablet, Take 400 mg by mouth every 6 (six) hours as needed for mild pain., Disp: , Rfl:    IVERMECTIN PO, Take 1.8 mLs by mouth daily. Ivermectin oil - 10 mg/ml, Disp: , Rfl:    letrozole (FEMARA) 2.5 MG tablet, Take 1 tablet (2.5 mg total) by mouth daily., Disp: 30 tablet, Rfl: 2   metFORMIN (GLUCOPHAGE) 500 MG tablet, Take 1 tablet (500 mg total) by mouth 3 (three) times daily., Disp: 90 tablet, Rfl: 5   methocarbamol (ROBAXIN) 500 MG tablet, Take 1 tablet (500 mg total) by mouth every 6 (six) hours as needed for muscle spasms., Disp: 20 tablet, Rfl: 0   Misc Natural Products (MAGIC MUSHROOM MIX) CAPS, Take 1 tablet by mouth daily., Disp: , Rfl:    Multiple Vitamins-Minerals (MULTIVITAMIN WITH MINERALS) tablet, Take 1 tablet by mouth daily., Disp: , Rfl:    NP  THYROID 60 MG tablet, Take 60 mg by mouth daily., Disp: , Rfl:    ondansetron (ZOFRAN) 8 MG tablet, Take 1 tablet (8 mg total) by mouth every 8 (eight) hours as needed for nausea or vomiting., Disp: 20 tablet, Rfl: 0   OVER THE COUNTER MEDICATION, Take 2 capsules by mouth 3 (three) times daily. Protectival, Disp: , Rfl:    polyethylene glycol (MIRALAX / GLYCOLAX) 17 g packet, Take 17 g by mouth daily. (Patient taking differently: Take 17 g by mouth daily as needed for moderate constipation.), Disp: 14 each, Rfl: 0   senna (SENOKOT) 8.6 MG TABS tablet, Take 1 tablet (8.6 mg total) by mouth daily., Disp: 120 tablet, Rfl: 0   trametinib dimethyl sulfoxide (MEKINIST) 2 MG tablet, Take 1 tablet (2 mg total) by mouth daily. Take 1 hour before or 2 hours after a meal. Store refrigerated in original container., Disp: 30 tablet, Rfl: 3   Zinc 100 MG TABS, Take 100 mg by mouth daily., Disp: , Rfl:   Allergies: No Known Allergies  Past Medical History, Surgical history, Social history, and Family History were reviewed and updated.  Review of Systems: Review of Systems  Constitutional:  Positive for fatigue.  HENT:  Negative.    Eyes: Negative.   Respiratory:  Positive for cough.   Cardiovascular: Negative.   Gastrointestinal: Negative.   Endocrine: Positive for hot flashes.  Genitourinary: Negative.    Musculoskeletal:  Positive for arthralgias and myalgias.  Skin: Negative.   Neurological:  Positive for dizziness and light-headedness.  Hematological: Negative.   Psychiatric/Behavioral: Negative.     Physical Exam:  weight is 116 lb (52.6 kg). Her oral temperature is 98.2 F (36.8 C). Her blood pressure is 111/63 and her pulse is 98. Her respiration is 19 and oxygen saturation is 100%.   Wt Readings from Last 3 Encounters:  05/05/21 116 lb (52.6 kg)  04/14/21 124 lb 12.8 oz (56.6 kg)  03/03/21 127 lb (57.6 kg)    Physical Exam Vitals reviewed.  HENT:     Head: Normocephalic and  atraumatic.  Eyes:     Pupils: Pupils are equal, round, and reactive to light.  Cardiovascular:     Rate and Rhythm: Normal rate and regular rhythm.     Heart sounds: Normal heart sounds.  Pulmonary:     Effort: Pulmonary effort is normal.     Breath sounds: Normal breath sounds.     Comments: Her lungs sound relatively clear on the right side.  There is some decrease on the left side.  I do not hear any wheezing.  There is no rhonchi. Abdominal:     General: Bowel sounds are normal.     Palpations: Abdomen is soft.  Musculoskeletal:        General: No tenderness or deformity. Normal range of motion.     Cervical back: Normal range of motion.  Lymphadenopathy:     Cervical: No cervical adenopathy.  Skin:    General: Skin is warm and dry.     Findings: No erythema or rash.     Comments: Under the left breast at about the 5-6 o'clock position, there is a nodule.  It is mobile.  It is nontender.  Probably measures about 8 x 4 mm.  Neurological:     Mental Status: She is alert and oriented to person, place, and time.  Psychiatric:        Behavior: Behavior normal.        Thought Content: Thought content normal.        Judgment: Judgment normal.     Lab Results  Component Value Date   WBC 9.7 04/14/2021   HGB 9.7 (L) 04/14/2021   HCT 30.1 (L) 04/14/2021   MCV 83.6 04/14/2021   PLT 571 (H) 04/14/2021     Chemistry      Component Value Date/Time   NA 134 (L) 04/14/2021 1136   K 4.1 04/14/2021 1136   CL 96 (L) 04/14/2021 1136   CO2 28 04/14/2021 1136   BUN 17 04/14/2021 1136   CREATININE 0.75 04/14/2021 1136      Component Value Date/Time   CALCIUM 9.5 04/14/2021 1136   ALKPHOS 94 04/14/2021 1136   AST 15 04/14/2021 1136   ALT 10 04/14/2021 1136   BILITOT 0.3 04/14/2021 1136      Impression and Plan: Ms. Mehrer is a very nice 70 year old white female.  She has 2 separate malignancies.  Her most "active" malignancy is the lung cancer.  Thankfully, she does have  the BRAF mutation.  I just have a feeling that were not seeing a response even though the tumor is BRAF mutated and she is on the Mekinist/Tafinlar.  The CT scan will clearly help [place].  Unfortunately, we cannot get any lab work on her.  The lab was closed by time she got to our office after her CT scan.  Again once we get the final report back from the radiologist on her CT scan, will then make some decisions as to how we can try to help.  We may need to get Pulmonary Medicine to think about a bronchoscopy to see about the left lung to see if there is a tumor obstructing the bronchus or if there is some type of extrinsic compression and see if a stent might be placed.  It would be nice to have gotten lab work on her.  I am sure we will have to get this.  Again we may have to look into a Pleurx catheter.  It is possible that Pulmonary Medicine may be able to help with this.  I know she is trying her best.  I have thought about using Enhertu.  It would be really nice to see if we can get the Foundation 1 studies and see if this may help as guide our therapy.  Again this is incredibly complicated.  Marland Kitchen   Volanda Napoleon, MD 11/23/20224:16 PM

## 2021-05-10 ENCOUNTER — Other Ambulatory Visit: Payer: Self-pay | Admitting: Hematology & Oncology

## 2021-05-10 DIAGNOSIS — J90 Pleural effusion, not elsewhere classified: Secondary | ICD-10-CM

## 2021-05-11 ENCOUNTER — Ambulatory Visit (HOSPITAL_COMMUNITY)
Admission: RE | Admit: 2021-05-11 | Discharge: 2021-05-11 | Disposition: A | Discharge status: Home / Self Care | Payer: Medicare Other | Source: Ambulatory Visit | Attending: Hematology & Oncology | Admitting: Hematology & Oncology

## 2021-05-11 ENCOUNTER — Other Ambulatory Visit: Payer: Self-pay | Admitting: Hematology & Oncology

## 2021-05-11 ENCOUNTER — Other Ambulatory Visit: Payer: Self-pay

## 2021-05-11 ENCOUNTER — Encounter: Payer: Self-pay | Admitting: *Deleted

## 2021-05-11 DIAGNOSIS — J90 Pleural effusion, not elsewhere classified: Secondary | ICD-10-CM

## 2021-05-11 HISTORY — PX: IR THORACENTESIS ASP PLEURAL SPACE W/IMG GUIDE: IMG5380

## 2021-05-11 IMAGING — DX DG CHEST 1V
1 series · 1 of 1 positions shown · non-contrast
Comparison: [DATE].

CLINICAL DATA: Post LEFT-sided thoracentesis.  Some reported pain.

EXAM:
CHEST  1 VIEW

[chest ap]
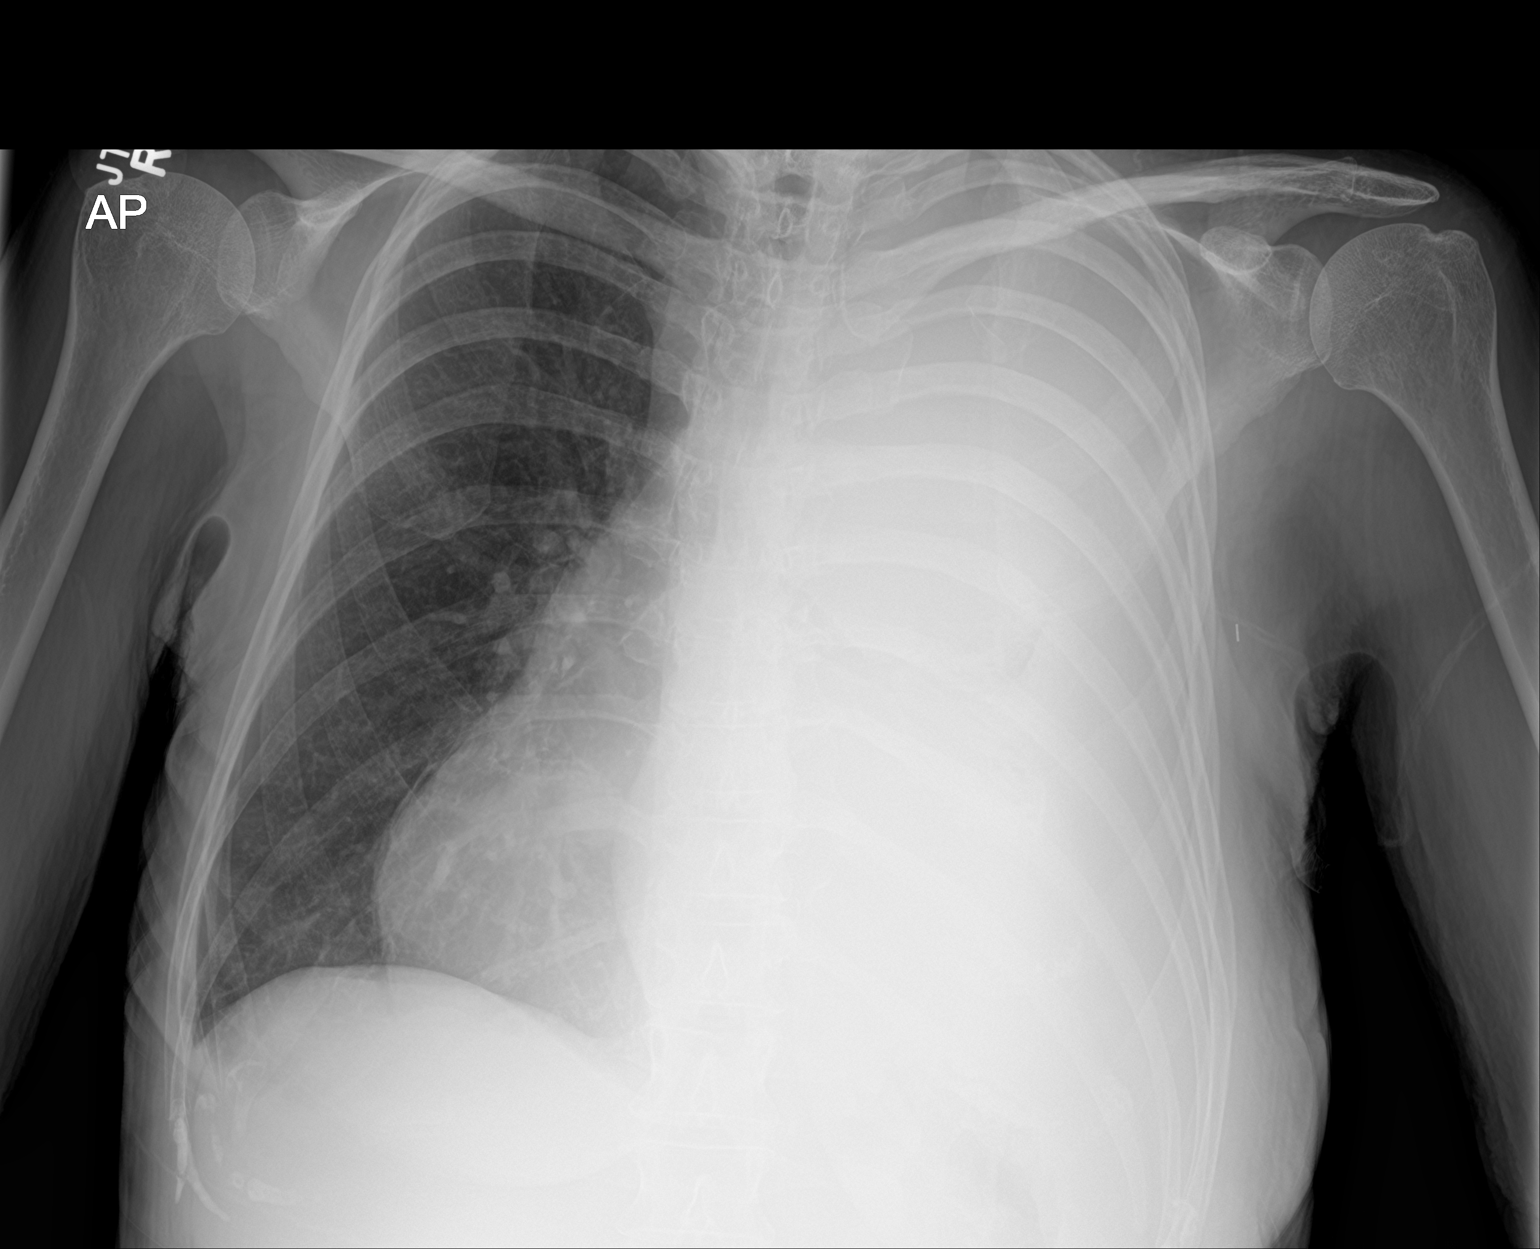

[1 of 1 positions shown; findings below may reference images not displayed]

FINDINGS: Complete opacification of the LEFT hemithorax persists. Minimal
aerated lung may be present in the mid chest as seen on recent chest
CT. There is no substantial change in the appearance of the chest
when compared to the examination [DATE].

Cardiomediastinal contours are stable with obscured LEFT heart
border and shift of heart into the RIGHT chest.

RIGHT lung is clear.

On limited assessment there is no acute skeletal process.
IMPRESSION: Persistent complete opacification of the LEFT hemithorax with
minimal aerated lung in the mid chest as seen on recent chest CT. No
significant change in the appearance of the chest when compared to
the recent chest radiograph. No visible pneumothorax.

## 2021-05-11 IMAGING — US IR THORACENTESIS ASP PLEURAL SPACE W/IMG GUIDE
1 series · 3 of 3 positions shown · non-contrast
Comparison: none

INDICATION: History of lung cancer with recurrent left pleural effusion. Request
for therapeutic and diagnostic thoracentesis.

[Series 1: ir (person_name)/(person_name) · 3 of 3 slices shown]
[im 1/3]
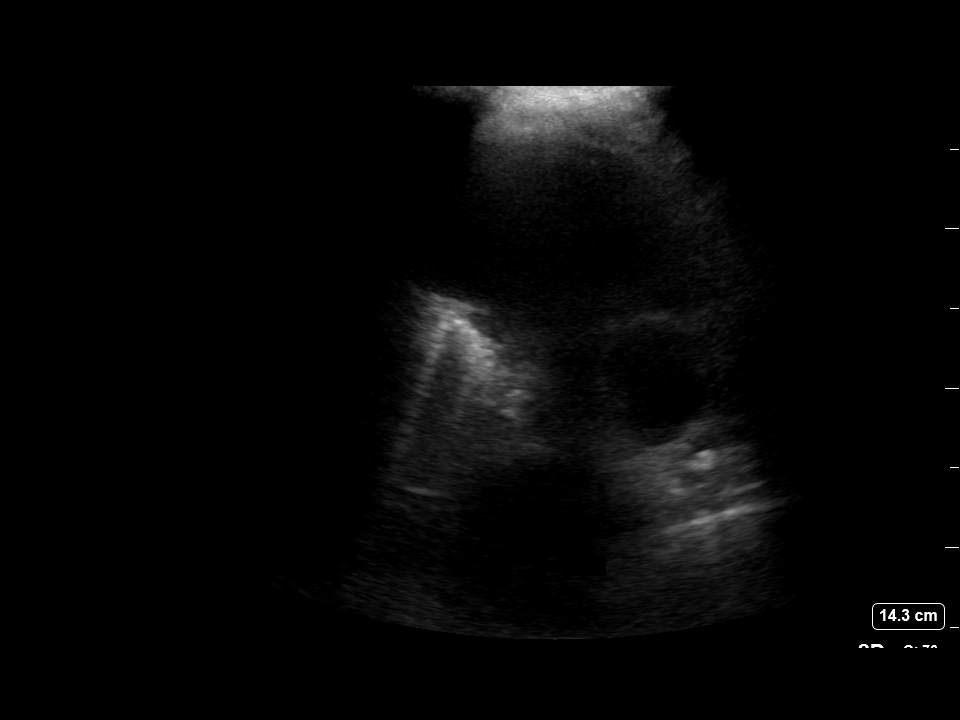
[im 2/3]
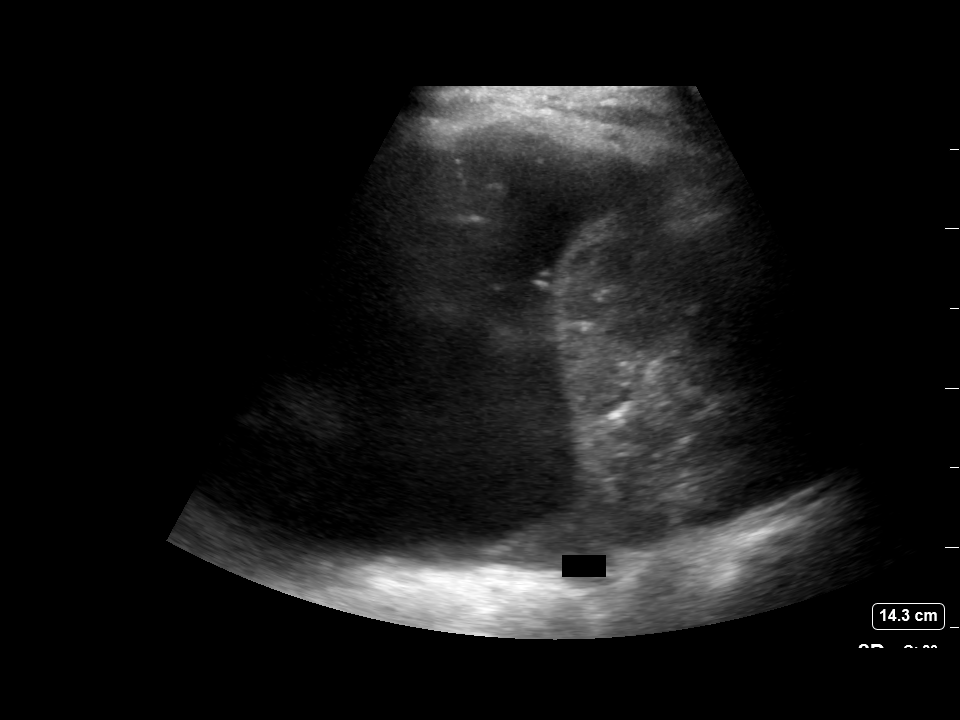
[im 3/3]
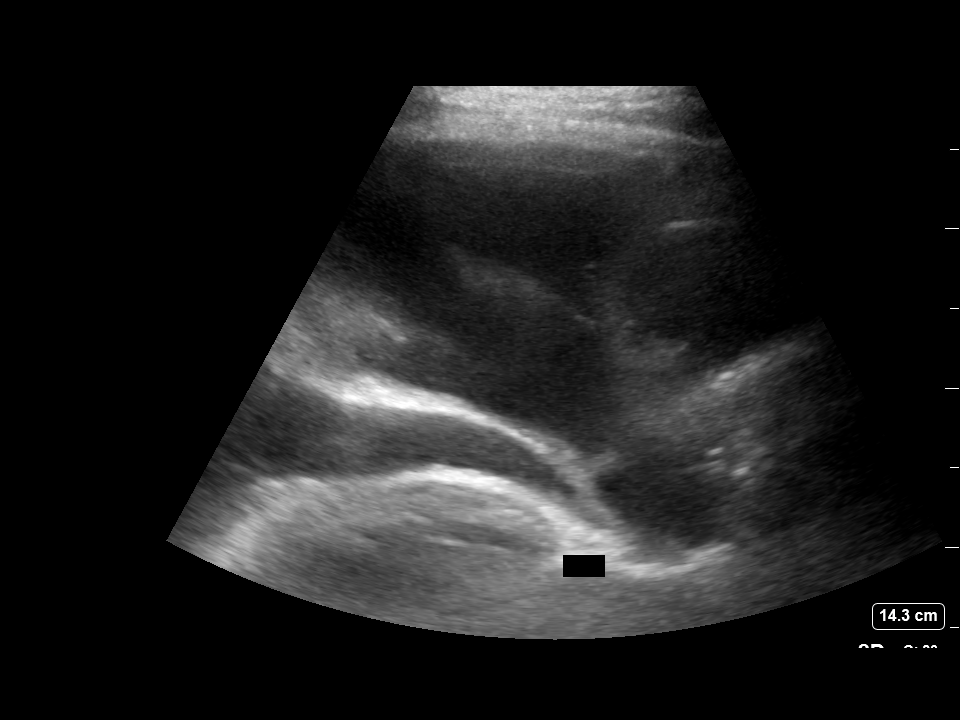

[3 of 3 positions shown; findings below may reference images not displayed]

EXAM:
ULTRASOUND GUIDED LEFT THORACENTESIS

MEDICATIONS:
15 mL 1% lidocaine

COMPLICATIONS:
None immediate.

PROCEDURE:
An ultrasound guided thoracentesis was thoroughly discussed with the
patient and questions answered. The benefits, risks, alternatives
and complications were also discussed. The patient understands and
wishes to proceed with the procedure. Written consent was obtained.

Ultrasound was performed to localize and mark an adequate pocket of
fluid in the left chest. The area was then prepped and draped in the
normal sterile fashion. 1% Lidocaine was used for local anesthesia.
Under ultrasound guidance a 19 gauge, 7-cm, Yueh catheter was
introduced. Thoracentesis was performed. The catheter was removed
and a dressing applied.
FINDINGS: A total of approximately 950 cc of clear yellow fluid was removed.
Samples were sent to the laboratory as requested by the clinical
team.

Post procedure chest X-ray reviewed, negative for pneumothorax.
IMPRESSION: Successful ultrasound guided left thoracentesis yielding 950 cc of
pleural fluid.

## 2021-05-11 MED ORDER — LIDOCAINE HCL (PF) 1 % IJ SOLN
INTRAMUSCULAR | Status: DC | PRN
  Administered 2021-05-11: 10 mL

## 2021-05-11 NOTE — Progress Notes (Signed)
.  ir

## 2021-05-11 NOTE — Progress Notes (Signed)
[facility] to check on status of testing. They state they reached out to patient last week and explained that they needed an Advanced Beneficiary Notice signed by the patient. They emailed the patient this form and she stated she would speak to her doctor about it before signing anything. This form is used for Medicare patients and they sign stating that they will pay for the testing if the insurance company denies payment. Testing is on hold until they get a response from the patient.   Notified Dr Marin Olp.   Oncology Nurse Navigator Documentation  Oncology Nurse Navigator Flowsheets 05/11/2021  Abnormal Finding Date -  Confirmed Diagnosis Date -  Diagnosis Status -  Navigator Follow Up Date: 05/14/2021  Navigator Follow Up Reason: Appointment Review  Navigation Complete Date: -  Post Navigation: Continue to Follow Patient? -  Reason Not Navigating Patient: -  Navigator Location [facility]  Referral Date to RadOnc/MedOnc -  Navigator Encounter Type Appt/Treatment Plan Review;Molecular Studies  Telephone -  [facility] Date -  Multidisiplinary Clinic Type -  Patient Visit Type MedOnc  Treatment Phase Active Tx  Barriers/Navigation Needs Coordination of Care;Education  Education -  Interventions Coordination of Care  Acuity Level 2-Minimal Needs (1-2 Barriers Identified)  Coordination of Care Pathology  Education Method -  Support Groups/Services Friends and Family  Time Spent with Patient 30

## 2021-05-11 NOTE — Progress Notes (Signed)
Previous CT on 05/07/21 was reviewed and the left pleural space was inspected by Dr. Dwaine Gale during thoracentesis.   Patient's pleural effusion is highly loculated, patient may require a chest tube placement for more definitive management of the loculated pleural effusion. Patient informed, she states that she is seeing her pulmonologist tomorrow to discuss about the treatment options for her left collapsed lungs.   Armando Gang Messiah Rovira PA-C 05/11/2021 2:48 PM

## 2021-05-11 NOTE — Procedures (Signed)
PROCEDURE SUMMARY:  Successful image-guided left thoracentesis. Yielded 950 cc of clear yellow fluid. Pt tolerated procedure well. No immediate complications. EBL = trace   Specimen was sent for labs. CXR ordered.  Please see imaging section of Epic for full dictation.  Armando Gang Eleni Frank PA-C 05/11/2021 2:41 PM

## 2021-05-12 ENCOUNTER — Telehealth: Payer: Self-pay | Admitting: Pulmonary Disease

## 2021-05-12 ENCOUNTER — Ambulatory Visit (INDEPENDENT_AMBULATORY_CARE_PROVIDER_SITE_OTHER): Payer: Medicare Other | Admitting: Pulmonary Disease

## 2021-05-12 ENCOUNTER — Encounter: Payer: Self-pay | Admitting: Pulmonary Disease

## 2021-05-12 ENCOUNTER — Telehealth: Payer: Self-pay | Admitting: *Deleted

## 2021-05-12 VITALS — BP 102/68 | HR 109 | Temp 97.6°F | Ht 62.0 in | Wt 120.4 lb

## 2021-05-12 DIAGNOSIS — C3492 Malignant neoplasm of unspecified part of left bronchus or lung: Secondary | ICD-10-CM | POA: Diagnosis not present

## 2021-05-12 DIAGNOSIS — J910 Malignant pleural effusion: Secondary | ICD-10-CM | POA: Diagnosis present

## 2021-05-12 DIAGNOSIS — J948 Other specified pleural conditions: Secondary | ICD-10-CM | POA: Diagnosis not present

## 2021-05-12 NOTE — Patient Instructions (Addendum)
Thank you for visiting Dr. Valeta Harms at [facility]. Today we recommend the following:  Orders Placed This Encounter  Procedures   Procedural/ Surgical Case Request: VIDEO BRONCHOSCOPY WITHOUT FLUORO, INSERTION PLEURAL DRAINAGE CATHETER   Ambulatory referral to Pulmonology   Pleurex catheter placement and bronchoscopy on 05/28/2021  Return in about 27 days (around 06/08/2021) for with Eric Form, NP. Or APP for suture removal and wound check     Please do your part to reduce the spread of COVID-19.

## 2021-05-12 NOTE — Telephone Encounter (Signed)
Call received from patient wanting to know when Dr. Marin Olp would be starting a "new medication" that was mentioned at her last visit with MD.  Informed pt that Dr. Marin Olp is awaiting [facility] One Studies and that she will need to get signed form back to [facility] before testing can be done per note from [employer].  Pt states that she has received form to be signed, but did not confirm that she would sign form and return it to [facility].  Dr. Marin Olp notified of above conversation.

## 2021-05-12 NOTE — Telephone Encounter (Signed)
Pt has been scheduled for 12/16 at 7:30.  She will go for covid test on 12/13.  I spoke to pt & gave her appt info.

## 2021-05-12 NOTE — H&P (View-Only) (Signed)
Synopsis: Referred in November 2022 for malignant pleural effusion by Gaynelle Cage, MD  Subjective:   PATIENT ID: Tami Stone GENDER: female DOB: 06-27-1950, MRN: [id number]  Chief Complaint  Patient presents with   Follow-up    Patient is here to discuss options for her lungs    This is a 70 year old female, past medical history of lung cancer stage IV in November 2021 status post bronchoscopy by Dr. Lamonte Sakai.  Patient was referred today for evaluation of indwelling pleural catheter placement.  Her primary oncologist Dr. Marin Olp reached out for evaluation.  Patient had a CT scan of the chest recently which showed significant amount of loculated pleural fluid on the left side.  Patient underwent thoracentesis yesterday by interventional radiology in which they drained an additional 950 cc.  Approximately 2-1/2 weeks prior she had around another liter removed.  She does feel some short of breath at baseline.  Also shortness of breath with exertion.   Past Medical History:  Diagnosis Date   Diabetes ([place])    High cholesterol    Lung cancer ([place])    04/2020     Family History  Problem Relation Age of Onset   Breast cancer Sister        half sister on maternal side     Past Surgical History:  Procedure Laterality Date   BREAST BIOPSY Right    BRONCHIAL BIOPSY  04/30/2020   Procedure: BRONCHIAL BIOPSIES;  Surgeon: Collene Gobble, MD;  Location: [place];  Service: Cardiopulmonary;;   BRONCHIAL BRUSHINGS  04/30/2020   Procedure: BRONCHIAL BRUSHINGS;  Surgeon: Collene Gobble, MD;  Location: [place];  Service: Cardiopulmonary;;   BRONCHIAL BRUSHINGS  01/20/2021   Procedure: BRONCHIAL BRUSHINGS;  Surgeon: Laurin Coder, MD;  Location: [facility];  Service: Endoscopy;;   BRONCHIAL WASHINGS  01/20/2021   Procedure: BRONCHIAL WASHINGS;  Surgeon: Laurin Coder, MD;  Location: [facility];  Service: Endoscopy;;   HEMOSTASIS CONTROL  04/30/2020   Procedure:  HEMOSTASIS CONTROL;  Surgeon: Collene Gobble, MD;  Location: [place];  Service: Cardiopulmonary;;   IR THORACENTESIS ASP PLEURAL SPACE W/IMG GUIDE  04/15/2021   IR THORACENTESIS ASP PLEURAL SPACE W/IMG GUIDE  05/03/2021   IR THORACENTESIS ASP PLEURAL SPACE W/IMG GUIDE  05/11/2021   VIDEO BRONCHOSCOPY N/A 04/30/2020   Procedure: VIDEO BRONCHOSCOPY WITH FLUORO;  Surgeon: Collene Gobble, MD;  Location: [place];  Service: Cardiopulmonary;  Laterality: N/A;   VIDEO BRONCHOSCOPY N/A 01/20/2021   Procedure: VIDEO BRONCHOSCOPY WITHOUT FLUORO;  Surgeon: Laurin Coder, MD;  Location: [facility];  Service: Endoscopy;  Laterality: N/A;    Social History   Socioeconomic History   Marital status: Married    Spouse name: Not on file   Number of children: Not on file   Years of education: Not on file   Highest education level: Not on file  Occupational History   Not on file  Tobacco Use   Smoking status: Never   Smokeless tobacco: Never  Vaping Use   Vaping Use: Never used  Substance and Sexual Activity   Alcohol use: Not Currently   Drug use: Never   Sexual activity: Not on file  Other Topics Concern   Not on file  Social History Narrative   Not on file   Social Determinants of Health   Financial Resource Strain: Not on file  Food Insecurity: Not on file  Transportation Needs: Not on file  Physical Activity: Not on file  Stress: Not  on file  Social Connections: Not on file  Intimate Partner Violence: Not on file     No Known Allergies   Outpatient Medications Prior to Visit  Medication Sig Dispense Refill   Coenzyme Q10 (CO Q 10 PO) Take 300 mg by mouth at bedtime.     dabrafenib mesylate (TAFINLAR) 75 MG capsule Take 2 capsules (150 mg total) by mouth 2 (two) times daily. Take on an empty stomach 1 hour before or 2 hours after meals. 120 capsule 3   DANDELION PO Take 1 capsule by mouth daily.     diphenhydrAMINE-zinc acetate (BENADRYL) cream Apply topically as  needed for itching (as needed for itching as labeled). 28.4 g 0   dronabinol (MARINOL) 5 MG capsule Take 1 capsule (5 mg total) by mouth 2 (two) times daily before a meal. 60 capsule 2   feeding supplement (ENSURE ENLIVE / ENSURE PLUS) LIQD Take 237 mLs by mouth 3 (three) times daily between meals. 237 mL 12   ibuprofen (ADVIL) 200 MG tablet Take 400 mg by mouth every 6 (six) hours as needed for mild pain.     IVERMECTIN PO Take 1.8 mLs by mouth daily. Ivermectin oil - 10 mg/ml     letrozole (FEMARA) 2.5 MG tablet Take 1 tablet (2.5 mg total) by mouth daily. 30 tablet 2   metFORMIN (GLUCOPHAGE) 500 MG tablet Take 1 tablet (500 mg total) by mouth 3 (three) times daily. 90 tablet 5   methocarbamol (ROBAXIN) 500 MG tablet Take 1 tablet (500 mg total) by mouth every 6 (six) hours as needed for muscle spasms. 20 tablet 0   Misc Natural Products (MAGIC MUSHROOM MIX) CAPS Take 1 tablet by mouth daily.     Multiple Vitamins-Minerals (MULTIVITAMIN WITH MINERALS) tablet Take 1 tablet by mouth daily.     NP THYROID 60 MG tablet Take 60 mg by mouth daily.     ondansetron (ZOFRAN) 8 MG tablet Take 1 tablet (8 mg total) by mouth every 8 (eight) hours as needed for nausea or vomiting. 20 tablet 0   OVER THE COUNTER MEDICATION Take 2 capsules by mouth 3 (three) times daily. Protectival     polyethylene glycol (MIRALAX / GLYCOLAX) 17 g packet Take 17 g by mouth daily. (Patient taking differently: Take 17 g by mouth daily as needed for moderate constipation.) 14 each 0   senna (SENOKOT) 8.6 MG TABS tablet Take 1 tablet (8.6 mg total) by mouth daily. 120 tablet 0   trametinib dimethyl sulfoxide (MEKINIST) 2 MG tablet Take 1 tablet (2 mg total) by mouth daily. Take 1 hour before or 2 hours after a meal. Store refrigerated in original container. 30 tablet 3   Zinc 100 MG TABS Take 100 mg by mouth daily.     acetaminophen (TYLENOL) 325 MG tablet Take 2 tablets (650 mg total) by mouth every 6 (six) hours as needed for  mild pain, moderate pain or fever. (Patient not taking: Reported on 05/12/2021) 20 tablet 0   No facility-administered medications prior to visit.    Review of Systems  Constitutional:  Negative for chills, fever, malaise/fatigue and weight loss.  HENT:  Negative for hearing loss, sore throat and tinnitus.   Eyes:  Negative for blurred vision and double vision.  Respiratory:  Positive for shortness of breath. Negative for cough, hemoptysis, sputum production, wheezing and stridor.   Cardiovascular:  Negative for chest pain, palpitations, orthopnea, leg swelling and PND.  Gastrointestinal:  Negative for abdominal pain, constipation, diarrhea,  heartburn, nausea and vomiting.  Genitourinary:  Negative for dysuria, hematuria and urgency.  Musculoskeletal:  Negative for joint pain and myalgias.  Skin:  Negative for itching and rash.  Neurological:  Negative for dizziness, tingling, weakness and headaches.  Endo/Heme/Allergies:  Negative for environmental allergies. Does not bruise/bleed easily.  Psychiatric/Behavioral:  Negative for depression. The patient is not nervous/anxious and does not have insomnia.   All other systems reviewed and are negative.   Objective:  Physical Exam Vitals reviewed.  Constitutional:      General: She is not in acute distress.    Appearance: She is well-developed.  HENT:     Head: Normocephalic and atraumatic.     Mouth/Throat:     Pharynx: No oropharyngeal exudate.  Eyes:     Conjunctiva/sclera: Conjunctivae normal.     Pupils: Pupils are equal, round, and reactive to light.  Neck:     Vascular: No JVD.     Trachea: No tracheal deviation.     Comments: Loss of supraclavicular fat Cardiovascular:     Rate and Rhythm: Normal rate and regular rhythm.     Heart sounds: S1 normal and S2 normal.     Comments: Distant heart tones Pulmonary:     Effort: No tachypnea or accessory muscle usage.     Breath sounds: No stridor. Decreased breath sounds  (throughout all lung fields) present. No wheezing, rhonchi or rales.     Comments: Absent breath sounds in the left chest Abdominal:     General: Bowel sounds are normal. There is no distension.     Palpations: Abdomen is soft.     Tenderness: There is no abdominal tenderness.  Musculoskeletal:        General: Deformity (muscle wasting ) present.  Skin:    General: Skin is warm and dry.     Capillary Refill: Capillary refill takes less than 2 seconds.     Findings: No rash.  Neurological:     Mental Status: She is alert and oriented to person, place, and time.  Psychiatric:        Behavior: Behavior normal.     Vitals:   05/12/21 0912  BP: 102/68  Pulse: (!) 109  Temp: 97.6 F (36.4 C)  TempSrc: Oral  SpO2: 95%  Weight: 120 lb 6.4 oz (54.6 kg)  Height: 5\' 2"  (1.575 m)   95% on RA BMI Readings from Last 3 Encounters:  05/12/21 22.02 kg/m  05/05/21 21.22 kg/m  04/14/21 22.83 kg/m   Wt Readings from Last 3 Encounters:  05/12/21 120 lb 6.4 oz (54.6 kg)  05/05/21 116 lb (52.6 kg)  04/14/21 124 lb 12.8 oz (56.6 kg)     CBC    Component Value Date/Time   WBC 9.7 04/14/2021 1136   WBC 9.9 02/05/2021 0457   RBC 3.60 (L) 04/14/2021 1136   HGB 9.7 (L) 04/14/2021 1136   HCT 30.1 (L) 04/14/2021 1136   PLT 571 (H) 04/14/2021 1136   MCV 83.6 04/14/2021 1136   MCH 26.9 04/14/2021 1136   MCHC 32.2 04/14/2021 1136   RDW 15.6 (H) 04/14/2021 1136   LYMPHSABS 2.1 04/14/2021 1136   MONOABS 0.9 04/14/2021 1136   EOSABS 0.4 04/14/2021 1136   BASOSABS 0.1 04/14/2021 1136     Chest Imaging: CT chest November 2022: Large loculated effusion on left side, pleural studding. The patient's images have been independently reviewed by me.    Pulmonary Functions Testing Results: No flowsheet data found.  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 Adenocarcinoma of left lung, stage 4 (HCC)  C34.92 Ambulatory referral to Pulmonology    Procedural/ Surgical Case Request: VIDEO BRONCHOSCOPY WITHOUT FLUORO, INSERTION PLEURAL DRAINAGE CATHETER    3. Non-small cell cancer of left lung (HCC)  C34.92     4. Hydropneumothorax  J94.8       Discussion:  This is a 70 year old female, non-smoker diagnosed in November 2021 with non-small cell lung cancer, stage IV disease with pleural involvement.  Patient has a large left-sided malignant pleural effusion.  He she has had this drained several times.  Plan: We discussed various options for management of malignant pleural effusion and left lung collapse today in the office. She may have some component of a central obstructing mass in the left lower lobe.  CT scan is very hard to tell with the fact that she has a large compressive malignant pleural effusion. We discussed the utility of indwelling pleural catheter placement. Patient is agreeable to proceed with this. At the same time I would like to plan for bronchoscopy for evaluation of the airways.  To see if there are any opportunity for palliative relief of the left lower lobe openings. We discussed this in detail to the office. We will plan for pleural catheter placement and bronchoscopy on the same date, 05/28/2021  Orders placed.  Appreciate PCC's help with scheduling. Patient is not on any blood thinners or antiplatelets.     Current Outpatient Medications:    Coenzyme Q10 (CO Q 10 PO), Take 300 mg by mouth at bedtime., Disp: , Rfl:    dabrafenib mesylate (TAFINLAR) 75 MG capsule, Take 2 capsules (150 mg total) by mouth 2 (two) times daily. Take on an empty stomach 1 hour before or 2 hours after meals., Disp: 120 capsule, Rfl: 3   DANDELION PO, Take 1 capsule by mouth daily., Disp: , Rfl:     diphenhydrAMINE-zinc acetate (BENADRYL) cream, Apply topically as needed for itching (as needed for itching as labeled)., Disp: 28.4 g, Rfl: 0   dronabinol (MARINOL) 5 MG capsule, Take 1 capsule (5 mg total) by mouth 2 (two) times daily before a meal., Disp: 60 capsule, Rfl: 2   feeding supplement (ENSURE ENLIVE / ENSURE PLUS) LIQD, Take 237 mLs by mouth 3 (three) times daily between meals., Disp: 237 mL, Rfl: 12   ibuprofen (ADVIL) 200 MG tablet, Take 400 mg by mouth every 6 (six) hours as needed for mild pain., Disp: , Rfl:    IVERMECTIN PO, Take 1.8 mLs by mouth daily. Ivermectin oil - 10 mg/ml, Disp: , Rfl:    letrozole (FEMARA) 2.5 MG tablet, Take 1 tablet (2.5 mg total) by mouth daily., Disp: 30 tablet, Rfl: 2   metFORMIN (GLUCOPHAGE) 500 MG tablet, Take 1 tablet (500 mg total) by mouth 3 (three) times daily., Disp: 90 tablet, Rfl: 5   methocarbamol (ROBAXIN) 500 MG tablet, Take 1 tablet (500 mg total) by mouth every 6 (six) hours as needed for muscle spasms., Disp: 20 tablet, Rfl: 0   Misc Natural Products (MAGIC MUSHROOM MIX) CAPS, Take 1 tablet by mouth daily., Disp: , Rfl:    Multiple Vitamins-Minerals (MULTIVITAMIN WITH MINERALS) tablet, Take 1 tablet by mouth daily., Disp: , Rfl:  NP THYROID 60 MG tablet, Take 60 mg by mouth daily., Disp: , Rfl:    ondansetron (ZOFRAN) 8 MG tablet, Take 1 tablet (8 mg total) by mouth every 8 (eight) hours as needed for nausea or vomiting., Disp: 20 tablet, Rfl: 0   OVER THE COUNTER MEDICATION, Take 2 capsules by mouth 3 (three) times daily. Protectival, Disp: , Rfl:    polyethylene glycol (MIRALAX / GLYCOLAX) 17 g packet, Take 17 g by mouth daily. (Patient taking differently: Take 17 g by mouth daily as needed for moderate constipation.), Disp: 14 each, Rfl: 0   senna (SENOKOT) 8.6 MG TABS tablet, Take 1 tablet (8.6 mg total) by mouth daily., Disp: 120 tablet, Rfl: 0   trametinib dimethyl sulfoxide (MEKINIST) 2 MG tablet, Take 1 tablet (2 mg total) by  mouth daily. Take 1 hour before or 2 hours after a meal. Store refrigerated in original container., Disp: 30 tablet, Rfl: 3   Zinc 100 MG TABS, Take 100 mg by mouth daily., Disp: , Rfl:    acetaminophen (TYLENOL) 325 MG tablet, Take 2 tablets (650 mg total) by mouth every 6 (six) hours as needed for mild pain, moderate pain or fever. (Patient not taking: Reported on 05/12/2021), Disp: 20 tablet, Rfl: 0  I spent 62 minutes dedicated to the care of this patient on the date of this encounter to include pre-visit review of records, face-to-face time with the patient discussing conditions above, post visit ordering of testing, clinical documentation with the electronic health record, making appropriate referrals as documented, and communicating necessary findings to members of the patients care team.   [name], DO [facility] 05/12/2021 9:22 AM

## 2021-05-12 NOTE — Progress Notes (Signed)
Synopsis: Referred in November 2022 for malignant pleural effusion by Gaynelle Cage, MD  Subjective:   PATIENT ID: Tami Stone GENDER: female DOB: [birth year], MRN: [id number]  Chief Complaint  Patient presents with   Follow-up    Patient is here to discuss options for her lungs    This is a 70 year old female, past medical history of lung cancer stage IV in November 2021 status post bronchoscopy by Dr. Lamonte Sakai.  Patient was referred today for evaluation of indwelling pleural catheter placement.  Her primary oncologist Dr. Marin Olp reached out for evaluation.  Patient had a CT scan of the chest recently which showed significant amount of loculated pleural fluid on the left side.  Patient underwent thoracentesis yesterday by interventional radiology in which they drained an additional 950 cc.  Approximately 2-1/2 weeks prior she had around another liter removed.  She does feel some short of breath at baseline.  Also shortness of breath with exertion.   Past Medical History:  Diagnosis Date   Diabetes ([place])    High cholesterol    Lung cancer ([place])    04/2020     Family History  Problem Relation Age of Onset   Breast cancer Sister        half sister on maternal side     Past Surgical History:  Procedure Laterality Date   BREAST BIOPSY Right    BRONCHIAL BIOPSY  04/30/2020   Procedure: BRONCHIAL BIOPSIES;  Surgeon: Collene Gobble, MD;  Location: [place];  Service: Cardiopulmonary;;   BRONCHIAL BRUSHINGS  04/30/2020   Procedure: BRONCHIAL BRUSHINGS;  Surgeon: Collene Gobble, MD;  Location: [place];  Service: Cardiopulmonary;;   BRONCHIAL BRUSHINGS  01/20/2021   Procedure: BRONCHIAL BRUSHINGS;  Surgeon: Laurin Coder, MD;  Location: [facility];  Service: Endoscopy;;   BRONCHIAL WASHINGS  01/20/2021   Procedure: BRONCHIAL WASHINGS;  Surgeon: Laurin Coder, MD;  Location: [facility];  Service: Endoscopy;;   HEMOSTASIS CONTROL  04/30/2020   Procedure:  HEMOSTASIS CONTROL;  Surgeon: Collene Gobble, MD;  Location: [place];  Service: Cardiopulmonary;;   IR THORACENTESIS ASP PLEURAL SPACE W/IMG GUIDE  04/15/2021   IR THORACENTESIS ASP PLEURAL SPACE W/IMG GUIDE  05/03/2021   IR THORACENTESIS ASP PLEURAL SPACE W/IMG GUIDE  05/11/2021   VIDEO BRONCHOSCOPY N/A 04/30/2020   Procedure: VIDEO BRONCHOSCOPY WITH FLUORO;  Surgeon: Collene Gobble, MD;  Location: [place];  Service: Cardiopulmonary;  Laterality: N/A;   VIDEO BRONCHOSCOPY N/A 01/20/2021   Procedure: VIDEO BRONCHOSCOPY WITHOUT FLUORO;  Surgeon: Laurin Coder, MD;  Location: [facility];  Service: Endoscopy;  Laterality: N/A;    Social History   Socioeconomic History   Marital status: Married    Spouse name: Not on file   Number of children: Not on file   Years of education: Not on file   Highest education level: Not on file  Occupational History   Not on file  Tobacco Use   Smoking status: Never   Smokeless tobacco: Never  Vaping Use   Vaping Use: Never used  Substance and Sexual Activity   Alcohol use: Not Currently   Drug use: Never   Sexual activity: Not on file  Other Topics Concern   Not on file  Social History Narrative   Not on file   Social Determinants of Health   Financial Resource Strain: Not on file  Food Insecurity: Not on file  Transportation Needs: Not on file  Physical Activity: Not on file  Stress: Not  on file  Social Connections: Not on file  Intimate Partner Violence: Not on file     No Known Allergies   Outpatient Medications Prior to Visit  Medication Sig Dispense Refill   Coenzyme Q10 (CO Q 10 PO) Take 300 mg by mouth at bedtime.     dabrafenib mesylate (TAFINLAR) 75 MG capsule Take 2 capsules (150 mg total) by mouth 2 (two) times daily. Take on an empty stomach 1 hour before or 2 hours after meals. 120 capsule 3   DANDELION PO Take 1 capsule by mouth daily.     diphenhydrAMINE-zinc acetate (BENADRYL) cream Apply topically as  needed for itching (as needed for itching as labeled). 28.4 g 0   dronabinol (MARINOL) 5 MG capsule Take 1 capsule (5 mg total) by mouth 2 (two) times daily before a meal. 60 capsule 2   feeding supplement (ENSURE ENLIVE / ENSURE PLUS) LIQD Take 237 mLs by mouth 3 (three) times daily between meals. 237 mL 12   ibuprofen (ADVIL) 200 MG tablet Take 400 mg by mouth every 6 (six) hours as needed for mild pain.     IVERMECTIN PO Take 1.8 mLs by mouth daily. Ivermectin oil - 10 mg/ml     letrozole (FEMARA) 2.5 MG tablet Take 1 tablet (2.5 mg total) by mouth daily. 30 tablet 2   metFORMIN (GLUCOPHAGE) 500 MG tablet Take 1 tablet (500 mg total) by mouth 3 (three) times daily. 90 tablet 5   methocarbamol (ROBAXIN) 500 MG tablet Take 1 tablet (500 mg total) by mouth every 6 (six) hours as needed for muscle spasms. 20 tablet 0   Misc Natural Products (MAGIC MUSHROOM MIX) CAPS Take 1 tablet by mouth daily.     Multiple Vitamins-Minerals (MULTIVITAMIN WITH MINERALS) tablet Take 1 tablet by mouth daily.     NP THYROID 60 MG tablet Take 60 mg by mouth daily.     ondansetron (ZOFRAN) 8 MG tablet Take 1 tablet (8 mg total) by mouth every 8 (eight) hours as needed for nausea or vomiting. 20 tablet 0   OVER THE COUNTER MEDICATION Take 2 capsules by mouth 3 (three) times daily. Protectival     polyethylene glycol (MIRALAX / GLYCOLAX) 17 g packet Take 17 g by mouth daily. (Patient taking differently: Take 17 g by mouth daily as needed for moderate constipation.) 14 each 0   senna (SENOKOT) 8.6 MG TABS tablet Take 1 tablet (8.6 mg total) by mouth daily. 120 tablet 0   trametinib dimethyl sulfoxide (MEKINIST) 2 MG tablet Take 1 tablet (2 mg total) by mouth daily. Take 1 hour before or 2 hours after a meal. Store refrigerated in original container. 30 tablet 3   Zinc 100 MG TABS Take 100 mg by mouth daily.     acetaminophen (TYLENOL) 325 MG tablet Take 2 tablets (650 mg total) by mouth every 6 (six) hours as needed for  mild pain, moderate pain or fever. (Patient not taking: Reported on 05/12/2021) 20 tablet 0   No facility-administered medications prior to visit.    Review of Systems  Constitutional:  Negative for chills, fever, malaise/fatigue and weight loss.  HENT:  Negative for hearing loss, sore throat and tinnitus.   Eyes:  Negative for blurred vision and double vision.  Respiratory:  Positive for shortness of breath. Negative for cough, hemoptysis, sputum production, wheezing and stridor.   Cardiovascular:  Negative for chest pain, palpitations, orthopnea, leg swelling and PND.  Gastrointestinal:  Negative for abdominal pain, constipation, diarrhea,  heartburn, nausea and vomiting.  Genitourinary:  Negative for dysuria, hematuria and urgency.  Musculoskeletal:  Negative for joint pain and myalgias.  Skin:  Negative for itching and rash.  Neurological:  Negative for dizziness, tingling, weakness and headaches.  Endo/Heme/Allergies:  Negative for environmental allergies. Does not bruise/bleed easily.  Psychiatric/Behavioral:  Negative for depression. The patient is not nervous/anxious and does not have insomnia.   All other systems reviewed and are negative.   Objective:  Physical Exam Vitals reviewed.  Constitutional:      General: She is not in acute distress.    Appearance: She is well-developed.  HENT:     Head: Normocephalic and atraumatic.     Mouth/Throat:     Pharynx: No oropharyngeal exudate.  Eyes:     Conjunctiva/sclera: Conjunctivae normal.     Pupils: Pupils are equal, round, and reactive to light.  Neck:     Vascular: No JVD.     Trachea: No tracheal deviation.     Comments: Loss of supraclavicular fat Cardiovascular:     Rate and Rhythm: Normal rate and regular rhythm.     Heart sounds: S1 normal and S2 normal.     Comments: Distant heart tones Pulmonary:     Effort: No tachypnea or accessory muscle usage.     Breath sounds: No stridor. Decreased breath sounds  (throughout all lung fields) present. No wheezing, rhonchi or rales.     Comments: Absent breath sounds in the left chest Abdominal:     General: Bowel sounds are normal. There is no distension.     Palpations: Abdomen is soft.     Tenderness: There is no abdominal tenderness.  Musculoskeletal:        General: Deformity (muscle wasting ) present.  Skin:    General: Skin is warm and dry.     Capillary Refill: Capillary refill takes less than 2 seconds.     Findings: No rash.  Neurological:     Mental Status: She is alert and oriented to person, place, and time.  Psychiatric:        Behavior: Behavior normal.     Vitals:   05/12/21 0912  BP: 102/68  Pulse: (!) 109  Temp: 97.6 F (36.4 C)  TempSrc: Oral  SpO2: 95%  Weight: 120 lb 6.4 oz (54.6 kg)  Height: 5\' 2"  (1.575 m)   95% on RA BMI Readings from Last 3 Encounters:  05/12/21 22.02 kg/m  05/05/21 21.22 kg/m  04/14/21 22.83 kg/m   Wt Readings from Last 3 Encounters:  05/12/21 120 lb 6.4 oz (54.6 kg)  05/05/21 116 lb (52.6 kg)  04/14/21 124 lb 12.8 oz (56.6 kg)     CBC    Component Value Date/Time   WBC 9.7 04/14/2021 1136   WBC 9.9 02/05/2021 0457   RBC 3.60 (L) 04/14/2021 1136   HGB 9.7 (L) 04/14/2021 1136   HCT 30.1 (L) 04/14/2021 1136   PLT 571 (H) 04/14/2021 1136   MCV 83.6 04/14/2021 1136   MCH 26.9 04/14/2021 1136   MCHC 32.2 04/14/2021 1136   RDW 15.6 (H) 04/14/2021 1136   LYMPHSABS 2.1 04/14/2021 1136   MONOABS 0.9 04/14/2021 1136   EOSABS 0.4 04/14/2021 1136   BASOSABS 0.1 04/14/2021 1136     Chest Imaging: CT chest November 2022: Large loculated effusion on left side, pleural studding. The patient's images have been independently reviewed by me.    Pulmonary Functions Testing Results: No flowsheet data found.  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 Adenocarcinoma of left lung, stage 4 (HCC)  C34.92 Ambulatory referral to Pulmonology    Procedural/ Surgical Case Request: VIDEO BRONCHOSCOPY WITHOUT FLUORO, INSERTION PLEURAL DRAINAGE CATHETER    3. Non-small cell cancer of left lung (HCC)  C34.92     4. Hydropneumothorax  J94.8       Discussion:  This is a 70 year old female, non-smoker diagnosed in November 2021 with non-small cell lung cancer, stage IV disease with pleural involvement.  Patient has a large left-sided malignant pleural effusion.  He she has had this drained several times.  Plan: We discussed various options for management of malignant pleural effusion and left lung collapse today in the office. She may have some component of a central obstructing mass in the left lower lobe.  CT scan is very hard to tell with the fact that she has a large compressive malignant pleural effusion. We discussed the utility of indwelling pleural catheter placement. Patient is agreeable to proceed with this. At the same time I would like to plan for bronchoscopy for evaluation of the airways.  To see if there are any opportunity for palliative relief of the left lower lobe openings. We discussed this in detail to the office. We will plan for pleural catheter placement and bronchoscopy on the same date, 05/28/2021  Orders placed.  Appreciate PCC's help with scheduling. Patient is not on any blood thinners or antiplatelets.     Current Outpatient Medications:    Coenzyme Q10 (CO Q 10 PO), Take 300 mg by mouth at bedtime., Disp: , Rfl:    dabrafenib mesylate (TAFINLAR) 75 MG capsule, Take 2 capsules (150 mg total) by mouth 2 (two) times daily. Take on an empty stomach 1 hour before or 2 hours after meals., Disp: 120 capsule, Rfl: 3   DANDELION PO, Take 1 capsule by mouth daily., Disp: , Rfl:     diphenhydrAMINE-zinc acetate (BENADRYL) cream, Apply topically as needed for itching (as needed for itching as labeled)., Disp: 28.4 g, Rfl: 0   dronabinol (MARINOL) 5 MG capsule, Take 1 capsule (5 mg total) by mouth 2 (two) times daily before a meal., Disp: 60 capsule, Rfl: 2   feeding supplement (ENSURE ENLIVE / ENSURE PLUS) LIQD, Take 237 mLs by mouth 3 (three) times daily between meals., Disp: 237 mL, Rfl: 12   ibuprofen (ADVIL) 200 MG tablet, Take 400 mg by mouth every 6 (six) hours as needed for mild pain., Disp: , Rfl:    IVERMECTIN PO, Take 1.8 mLs by mouth daily. Ivermectin oil - 10 mg/ml, Disp: , Rfl:    letrozole (FEMARA) 2.5 MG tablet, Take 1 tablet (2.5 mg total) by mouth daily., Disp: 30 tablet, Rfl: 2   metFORMIN (GLUCOPHAGE) 500 MG tablet, Take 1 tablet (500 mg total) by mouth 3 (three) times daily., Disp: 90 tablet, Rfl: 5   methocarbamol (ROBAXIN) 500 MG tablet, Take 1 tablet (500 mg total) by mouth every 6 (six) hours as needed for muscle spasms., Disp: 20 tablet, Rfl: 0   Misc Natural Products (MAGIC MUSHROOM MIX) CAPS, Take 1 tablet by mouth daily., Disp: , Rfl:    Multiple Vitamins-Minerals (MULTIVITAMIN WITH MINERALS) tablet, Take 1 tablet by mouth daily., Disp: , Rfl:  NP THYROID 60 MG tablet, Take 60 mg by mouth daily., Disp: , Rfl:    ondansetron (ZOFRAN) 8 MG tablet, Take 1 tablet (8 mg total) by mouth every 8 (eight) hours as needed for nausea or vomiting., Disp: 20 tablet, Rfl: 0   OVER THE COUNTER MEDICATION, Take 2 capsules by mouth 3 (three) times daily. Protectival, Disp: , Rfl:    polyethylene glycol (MIRALAX / GLYCOLAX) 17 g packet, Take 17 g by mouth daily. (Patient taking differently: Take 17 g by mouth daily as needed for moderate constipation.), Disp: 14 each, Rfl: 0   senna (SENOKOT) 8.6 MG TABS tablet, Take 1 tablet (8.6 mg total) by mouth daily., Disp: 120 tablet, Rfl: 0   trametinib dimethyl sulfoxide (MEKINIST) 2 MG tablet, Take 1 tablet (2 mg total) by  mouth daily. Take 1 hour before or 2 hours after a meal. Store refrigerated in original container., Disp: 30 tablet, Rfl: 3   Zinc 100 MG TABS, Take 100 mg by mouth daily., Disp: , Rfl:    acetaminophen (TYLENOL) 325 MG tablet, Take 2 tablets (650 mg total) by mouth every 6 (six) hours as needed for mild pain, moderate pain or fever. (Patient not taking: Reported on 05/12/2021), Disp: 20 tablet, Rfl: 0  I spent 62 minutes dedicated to the care of this patient on the date of this encounter to include pre-visit review of records, face-to-face time with the patient discussing conditions above, post visit ordering of testing, clinical documentation with the electronic health record, making appropriate referrals as documented, and communicating necessary findings to members of the patients care team.   [name], DO [facility] 05/12/2021 9:22 AM

## 2021-05-13 LAB — CYTOLOGY - NON PAP

## 2021-05-18 ENCOUNTER — Encounter: Payer: Self-pay | Admitting: *Deleted

## 2021-05-18 ENCOUNTER — Telehealth: Payer: Self-pay

## 2021-05-18 NOTE — Telephone Encounter (Signed)
Patient called stating she was concerned about the covid testing prior to her surgery, informed her this was mandatory hospital protocol prior to surgeries. She also states she feels like her surgery is too far away and wants to know if Dr.Ennever is aware its that far away as she is worried if she will be able to breath well since she cant have any drains before that. Informed her Dr.Ennever was out for the day but will inform him of her surgery date tomorrow and call back with any information. She verbalized understanding at this time

## 2021-05-18 NOTE — Progress Notes (Signed)
Scheduled for bronch on 05/28/2021  Oncology Nurse Navigator Documentation  Oncology Nurse Navigator Flowsheets 05/18/2021  Abnormal Finding Date -  Confirmed Diagnosis Date -  Diagnosis Status -  Navigator Follow Up Date: 05/28/2021  Navigator Follow Up Reason: Surgery  Navigation Complete Date: -  Post Navigation: Continue to Follow Patient? -  Reason Not Navigating Patient: -  Navigator Location [facility]  Referral Date to RadOnc/MedOnc -  Navigator Encounter Type Appt/Treatment Plan Review  Telephone -  [facility] Date -  Multidis[facility] Clinic Type -  Patient Visit Type MedOnc  Treatment Phase Active Tx  Barriers/Navigation Needs Coordination of Care;Education  Education -  Interventions None Required  Acuity Level 2-Minimal Needs (1-2 Barriers Identified)  Coordination of Care -  Education Method -  Support Groups/Services Friends and Family  Time Spent with Patient 15

## 2021-05-21 ENCOUNTER — Telehealth: Payer: Self-pay | Admitting: *Deleted

## 2021-05-21 ENCOUNTER — Telehealth: Payer: Self-pay | Admitting: Pulmonary Disease

## 2021-05-21 ENCOUNTER — Ambulatory Visit
Admission: RE | Admit: 2021-05-21 | Discharge: 2021-05-21 | Disposition: A | Discharge status: Home / Self Care | Payer: Medicare Other | Source: Ambulatory Visit | Attending: Radiation Oncology | Admitting: Radiation Oncology

## 2021-05-21 ENCOUNTER — Encounter: Payer: Self-pay | Admitting: Radiation Oncology

## 2021-05-21 ENCOUNTER — Other Ambulatory Visit: Payer: Self-pay

## 2021-05-21 DIAGNOSIS — C7931 Secondary malignant neoplasm of brain: Secondary | ICD-10-CM

## 2021-05-21 IMAGING — MR MR HEAD WO/W CM
13 series · 48 of 48 positions shown · IV contrast (12 ML MULTIHANCE)
Comparison: MRI head with contrast [DATE]

CLINICAL DATA: Non-small-cell lung cancer. Follow-up metastatic
disease response to treatment.

EXAM:
MRI HEAD WITHOUT AND WITH CONTRAST
TECHNIQUE: Multiplanar, multiecho pulse sequences of the brain and surrounding
structures were obtained without and with intravenous contrast.
CONTRAST:  12mL MULTIHANCE GADOBENATE DIMEGLUMINE 529 MG/ML IV SOLN

[Series 2: FLAIR · sagittal · 3.0mm · 0.75mm/px · 1 of 39 slices shown (1 of 2)]
[im 1/39]
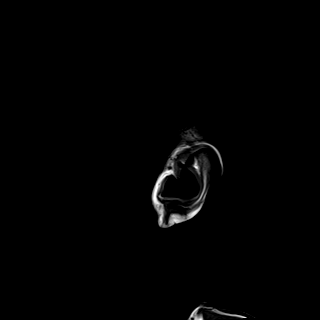

[Series 3: DWI · axial · 3.0mm · 1.50mm/px · z∈[-76,+80]mm · 4 of 82 slices shown (1 of 2)]
[im 1/82]
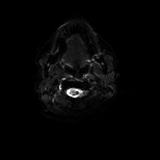
[im 28/82]
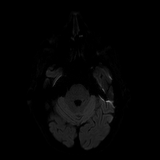
[im 55/82]
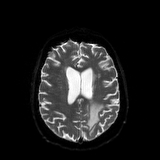
[im 82/82]
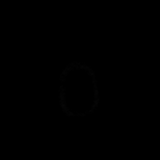

[Series 4: DWI · axial · 3.0mm · 1.50mm/px · z∈[-76,+80]mm · 2 of 41 slices shown (2 of 2)]
[im 1/41]
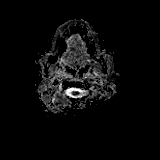
[im 41/41]
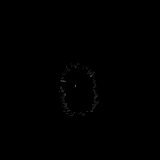

[Series 5: T2 · axial · 5.0mm · 0.57mm/px · 1 of 27 slices shown (1 of 2)]
[im 1/27]
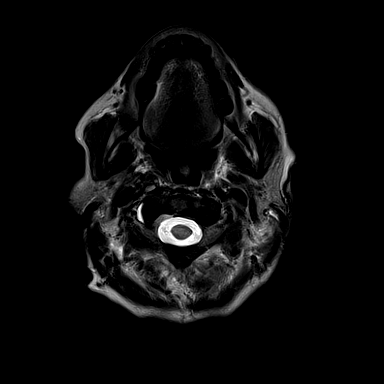

[Series 6: swi_images · axial · 1.5mm · 0.90mm/px · z∈[-75,+79]mm · 4 of 104 slices shown]
[im 1/104]
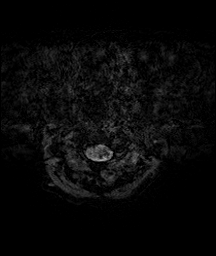
[im 35/104]
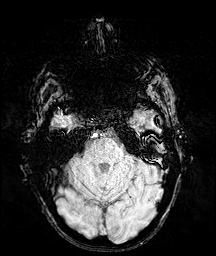
[im 69/104]
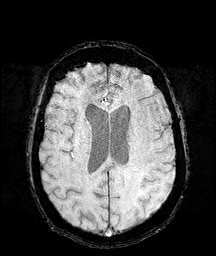
[im 104/104]
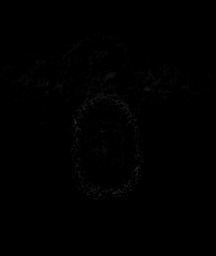

[Series 7: mip_images(sw) · axial · 12.0mm · 0.90mm/px · z∈[-70,+74]mm · 4 of 97 slices shown]
[im 1/97]
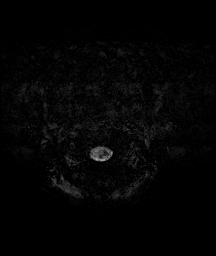
[im 33/97]
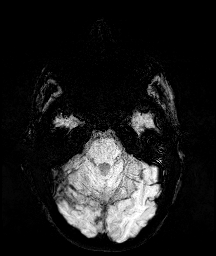
[im 65/97]
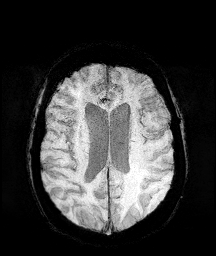
[im 97/97]
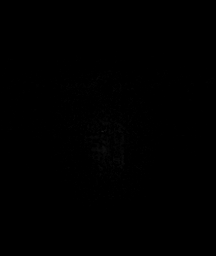

[Series 8: FLAIR · axial · 3.0mm · 0.94mm/px · z∈[-94,+98]mm · 3 of 64 slices shown (2 of 2)]
[im 1/64]
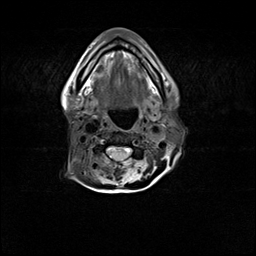
[im 32/64]
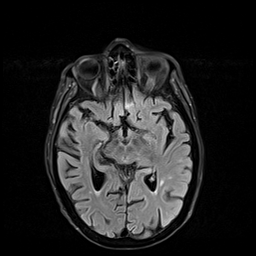
[im 64/64]
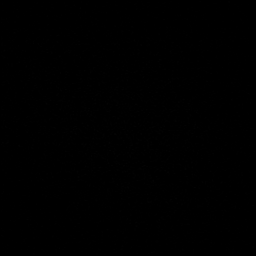

[Series 9: T2 · axial · non-contrast · 1.0mm · 0.98mm/px · z∈[-81,+91]mm · 8 of 176 slices shown (2 of 2)]
[im 1/176]
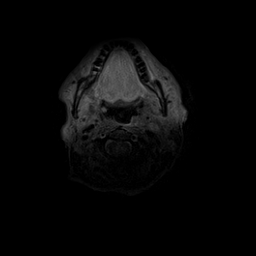
[im 26/176]
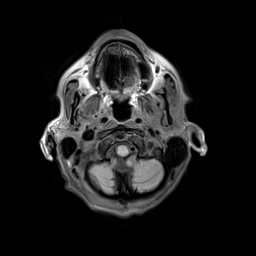
[im 51/176]
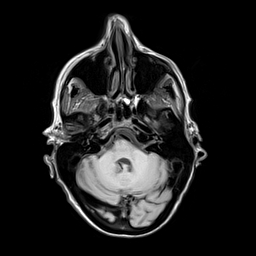
[im 76/176]
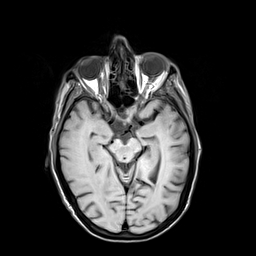
[im 101/176]
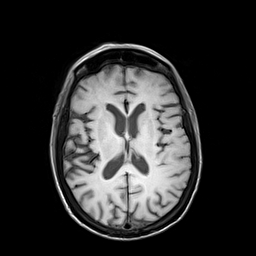
[im 126/176]
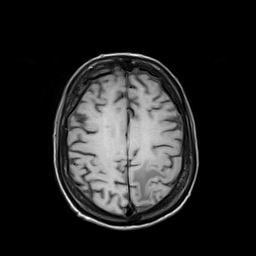
[im 151/176]
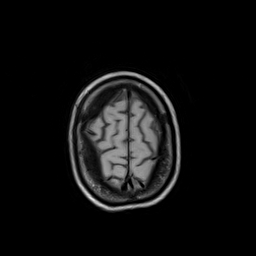
[im 176/176]
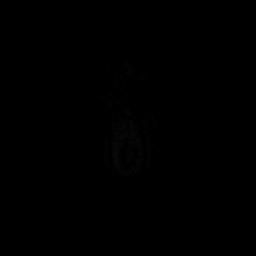

[Series 10: T2 post-contrast · coronal · 3.0mm · 0.60mm/px · 2 of 50 slices shown (1 of 2)]
[im 1/50]
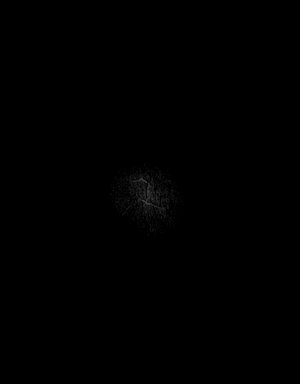
[im 50/50]
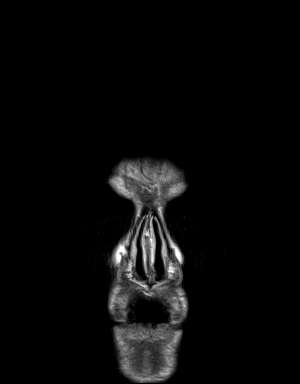

[Series 11: T2 post-contrast · axial · 1.0mm · 0.98mm/px · z∈[-81,+91]mm · 8 of 176 slices shown (2 of 2)]
[im 1/176]
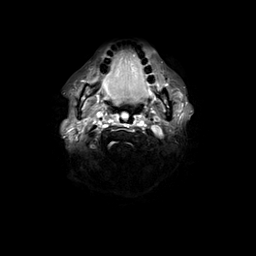
[im 26/176]
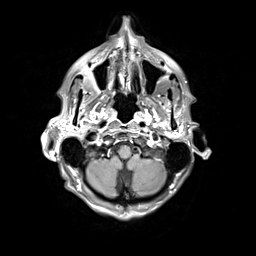
[im 51/176]
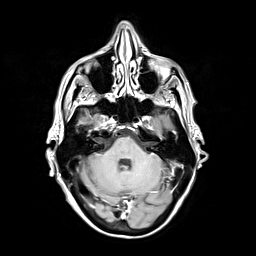
[im 76/176]
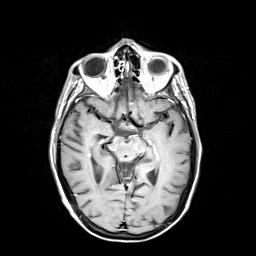
[im 101/176]
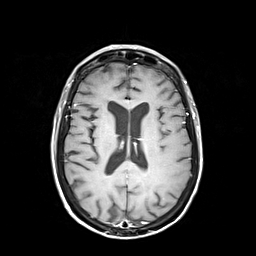
[im 126/176]
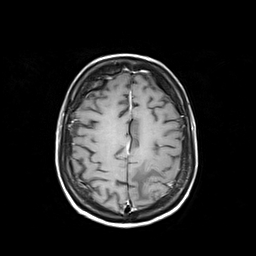
[im 151/176]
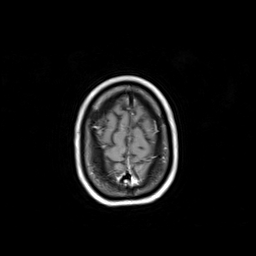
[im 176/176]
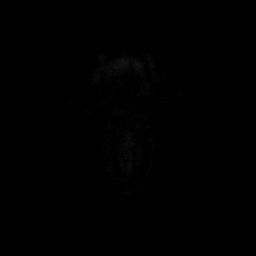

[Series 12: T1 post-contrast · axial · 1.0mm · 0.78mm/px · z∈[-82,+93]mm · 7 of 174 slices shown (1 of 2)]
[im 1/174]
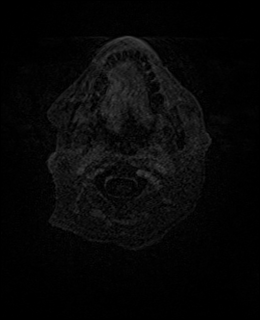
[im 29/174]
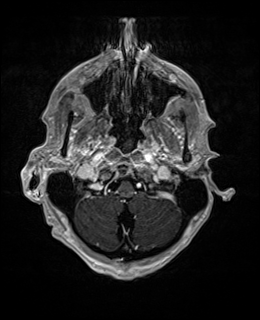
[im 58/174]
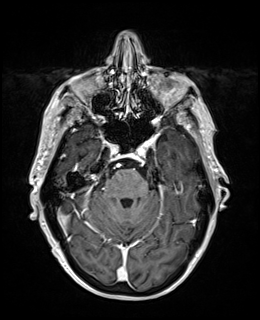
[im 87/174]
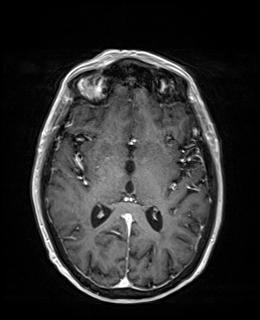
[im 116/174]
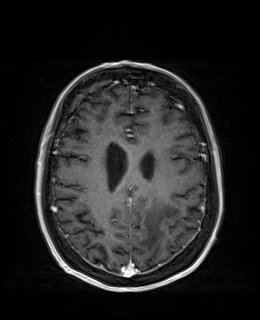
[im 145/174]
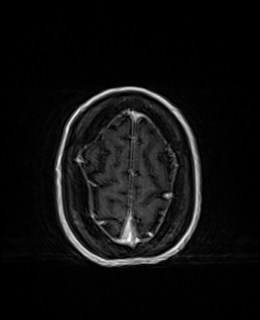
[im 174/174]
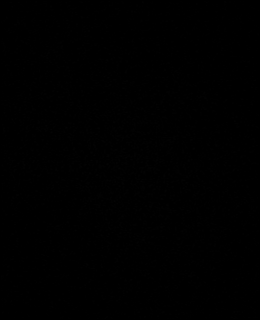

[Series 13: T1 post-contrast · coronal · 3.0mm · 0.57mm/px · 2 of 50 slices shown (2 of 2)]
[im 1/50]
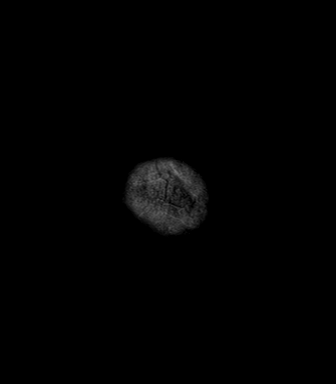
[im 50/50]
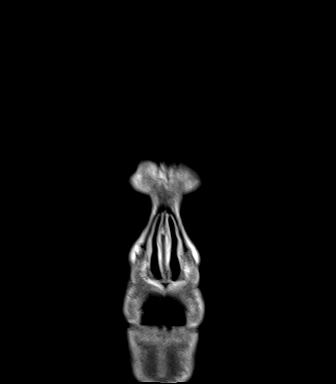

[Series 14: FLAIR post-contrast · sagittal · 3.0mm · 0.75mm/px · 2 of 39 slices shown]
[im 1/39]
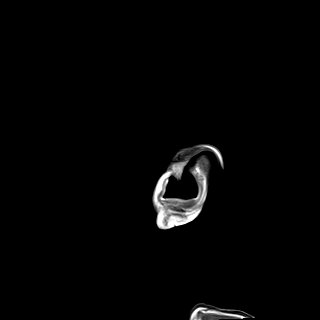
[im 39/39]
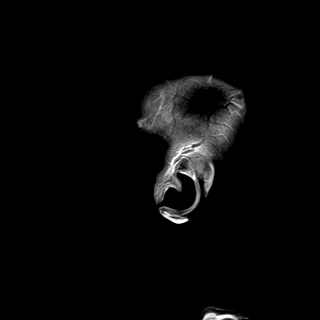

[48 of 48 positions shown; findings below may reference images not displayed]

FINDINGS: Brain: Progression of multiple enhancing lesions the brain
compatible with metastatic disease. The largest lesions in the left
parietal lobe show increased white matter edema.

Right temporal periventricular white matter lesion 2 mm with
progression. Axial image 76

1 mm lesion left gyrus rectus with progression.  Axial image 77

4 mm cortical lesion in the right lateral occipital lobe with
progression. Axial image 94

Linear enhancement in the right occipital lobe may represent
vascular enhancement versus metastatic disease. Attention on
follow-up. Axial image 91

Linear enhancement right frontal lobe on axial image [DATE] be
vascular enhancement versus cortical metastasis.

3 mm lesion right frontal lobe has progressed.  Axial image 104

1 mm lesion left parietal lobe axial image 110 has progressed.

8 mm enhancing lesion left parietal lobe has progressed. Axial image
132

7 mm enhancing lesion left parietal lobe has progressed. Axial image
134

2 mm enhancement left parietal lobe has progressed axial image 140

1 mm enhancing lesion left frontal cortex over the convexity has
progressed. Axial image 139

Ventricle size normal. No midline shift. Interval improvement in
small punctate areas of restricted diffusion in the white matter
bilaterally likely resolving infarcts. No intracranial hemorrhage

Vascular: Normal arterial flow voids

Skull and upper cervical spine: No focal skeletal abnormality.

Sinuses/Orbits: Mild mucosal edema paranasal sinuses. Negative orbit

Other: None
IMPRESSION: Progression of metastatic disease brain. Numerous small lesions are
present. The largest lesions left parietal lobe shows increased
edema.

Improving areas of small vessel infarction in the white matter
bilaterally.

## 2021-05-21 MED ORDER — GADOBENATE DIMEGLUMINE 529 MG/ML IV SOLN
12.0000 mL | Freq: Once | INTRAVENOUS | Status: AC | PRN
  Administered 2021-05-21: 12 mL via INTRAVENOUS

## 2021-05-21 NOTE — Progress Notes (Signed)
Patient reports shortness of breath due to fluid in lungs. Patient being followed by [facility] w/ Dr. June Leap and is scheduled for next office visit on 05/28/21. Meaningful use complete.  Patient notified of 2:30pm-05/24/21 telephone appointment and verbalized understanding.  Patient preferred contact # [phone]

## 2021-05-21 NOTE — Telephone Encounter (Signed)
Message received from patient requesting for Bronchoscopy to be moved up sooner than 05/28/21 d/t she has been having difficulty breathing this week. Call placed back to patient and patient notified to go to the ER now if she is having difficulty breathing.  Pt states that she does not want to go to the ER at this time, but that she does want the bronchoscopy sooner.  Pt instructed to contact Dr. Juline Patch office as he is the ordering MD for the procedure.  Pt states that she will call Dr. Juline Patch office now.

## 2021-05-21 NOTE — Telephone Encounter (Signed)
Called and spoke with pt about the date of her procedure and also about her recent OV with Dr. Valeta Harms and she verbalized understanding. Nothing further needed.

## 2021-05-24 ENCOUNTER — Inpatient Hospital Stay: Payer: Medicare Other | Attending: Hematology & Oncology

## 2021-05-24 ENCOUNTER — Ambulatory Visit
Admission: RE | Admit: 2021-05-24 | Discharge: 2021-05-24 | Disposition: A | Discharge status: Home / Self Care | Payer: Medicare Other | Source: Ambulatory Visit | Attending: Radiation Oncology | Admitting: Radiation Oncology

## 2021-05-24 DIAGNOSIS — Z9221 Personal history of antineoplastic chemotherapy: Secondary | ICD-10-CM | POA: Insufficient documentation

## 2021-05-24 DIAGNOSIS — C782 Secondary malignant neoplasm of pleura: Secondary | ICD-10-CM | POA: Insufficient documentation

## 2021-05-24 DIAGNOSIS — Z79811 Long term (current) use of aromatase inhibitors: Secondary | ICD-10-CM | POA: Insufficient documentation

## 2021-05-24 DIAGNOSIS — Z170 Estrogen receptor positive status [ER+]: Secondary | ICD-10-CM | POA: Insufficient documentation

## 2021-05-24 DIAGNOSIS — C7931 Secondary malignant neoplasm of brain: Secondary | ICD-10-CM | POA: Insufficient documentation

## 2021-05-24 DIAGNOSIS — C7951 Secondary malignant neoplasm of bone: Secondary | ICD-10-CM | POA: Insufficient documentation

## 2021-05-24 DIAGNOSIS — C3432 Malignant neoplasm of lower lobe, left bronchus or lung: Secondary | ICD-10-CM | POA: Insufficient documentation

## 2021-05-24 DIAGNOSIS — C50912 Malignant neoplasm of unspecified site of left female breast: Secondary | ICD-10-CM | POA: Insufficient documentation

## 2021-05-24 DIAGNOSIS — Z7984 Long term (current) use of oral hypoglycemic drugs: Secondary | ICD-10-CM | POA: Insufficient documentation

## 2021-05-24 DIAGNOSIS — C3492 Malignant neoplasm of unspecified part of left bronchus or lung: Secondary | ICD-10-CM

## 2021-05-24 DIAGNOSIS — Z79899 Other long term (current) drug therapy: Secondary | ICD-10-CM | POA: Insufficient documentation

## 2021-05-24 DIAGNOSIS — C50512 Malignant neoplasm of lower-outer quadrant of left female breast: Secondary | ICD-10-CM

## 2021-05-24 NOTE — Progress Notes (Signed)
Radiation Oncology         [phone] ________________________________   Outpatient Follow Up - Conducted via telephone due to current COVID-19 concerns for limiting patient exposure  I spoke with the patient to conduct this consult visit via telephone to spare the patient unnecessary potential exposure in the healthcare setting during the current COVID-19 pandemic. The patient was notified in advance and was offered a [place] meeting to allow for face to face communication but unfortunately reported that they did not have the appropriate resources/technology to support such a visit and instead preferred to proceed with a telephone visit.  Name: Tami Stone        MRN: [id number]  Date of Service: 05/24/2021 DOB: [birth year]  CC:Tami Cage, MD  Tami Cage, MD     REFERRING PHYSICIAN: Gaynelle Cage, MD   DIAGNOSIS: The primary encounter diagnosis was Malignant neoplasm of lower-outer quadrant of left breast of female, estrogen receptor positive (Tami Stone). A diagnosis of Adenocarcinoma of left lung, stage 4 (HCC) was also pertinent to this visit.   HISTORY OF PRESENT ILLNESS: Tami Stone is a 70 y.o. 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 She was originally diagnosed with her lung cancer in November 2021. Shortly after she was diagnosed with triple positive breast cancer. She also had a small lesion on MRI in the brain at that time that was vascular in nature.   She originally was planning to take her oral therapy but decided she did not want to begin this. She had second opinion at [facility] and while she started her targeted therapy in early 2022, she did make adjustments in her dose and discontinued this for periods of time despite the medical oncologist's recommendations. She last saw Dr. Georgiann Cocker and Illene Regulus, [facility] in June 2022. She elected to relocate her  care back to [facility] and established with Dr. Marin Olp in July 2022. She had progressive disease in the chest noted by imaging in August 2022 that was confirmed as metastatic disease in her pleural fluid. She had imaging of the brain during a hospitalization that showed at least 6 lesions. Further imaging showed 8 lesions and she pursued stereotactic radiosurgery [facility]) in September 2022.   She has been followed by the brain oncology program since her treatment. Her most recent MRI on 04/03/21 showed mixed treatment response and possibly two new lesions versus vascular changes. It was recommended she have a repeat MRI which was performed on 05/21/21 and showed multiple lesions, at least 8 new lesions. Her case was discussed in brain oncology conference and she's contacted today to discuss these results, and Dr. Ida Rogue recommendation for whole brain radiation.      PREVIOUS RADIATION THERAPY:    02/11/2021 through 02/11/2021 SRS Treatment Site Technique Total Dose (Gy) Dose per Fx (Gy) Completed Fx Beam Energies  Brain:    PTV_1_LtCereb33m PTV_2_RtCereb316m PTV_3_RtCereb4m92mTV_4_LtFront6mm63mV_5_RtFront5mm 2m_6_LtPariet4mm P49m7_LtFront4mm  P9m8_RtFront5mm  IM59m20/20 20 1/1 6XFFF     PAST MEDICAL HISTORY:  Past Medical History:  Diagnosis Date   Diabetes (HCC)    [place]    Lung cancer (HCC)    [place]2021       PAST SURGICAL HISTORY: Past Surgical History:  Procedure Laterality Date   BREAST BIOPSY Right    BRONCHIAL BIOPSY  04/30/2020   Procedure: BRONCHIAL BIOPSIES;  Surgeon: Byrum, [facility]Collene [facility];  Service: Cardiopulmonary;;   BRONCHIAL BRUSHINGS  04/30/2020   Procedure: BRONCHIAL BRUSHINGS;  Surgeon: Collene Gobble, MD;  Location: [place];  Service: Cardiopulmonary;;   BRONCHIAL BRUSHINGS  01/20/2021   Procedure: BRONCHIAL BRUSHINGS;  Surgeon: Laurin Coder, MD;  [facility];  Service: Endoscopy;;   BRONCHIAL WASHINGS   01/20/2021   Procedure: BRONCHIAL WASHINGS;  Surgeon: Laurin Coder, MD;  [facility];  Service: Endoscopy;;   HEMOSTASIS CONTROL  04/30/2020   Procedure: HEMOSTASIS CONTROL;  Surgeon: Collene Gobble, MD;  Location: [place];  Service: Cardiopulmonary;;   IR THORACENTESIS ASP PLEURAL SPACE W/IMG GUIDE  04/15/2021   IR THORACENTESIS ASP PLEURAL SPACE W/IMG GUIDE  05/03/2021   IR THORACENTESIS ASP PLEURAL SPACE W/IMG GUIDE  05/11/2021   VIDEO BRONCHOSCOPY N/A 04/30/2020   Procedure: VIDEO BRONCHOSCOPY WITH FLUORO;  Surgeon: Collene Gobble, MD;  Location: [place];  Service: Cardiopulmonary;  Laterality: N/A;   VIDEO BRONCHOSCOPY N/A 01/20/2021   Procedure: VIDEO BRONCHOSCOPY WITHOUT FLUORO;  Surgeon: Laurin Coder, MD;  [facility];  Service: Endoscopy;  Laterality: N/A;     FAMILY HISTORY:  Family History  Problem Relation Age of Onset   Breast cancer Sister        half sister on maternal side     SOCIAL HISTORY:  reports that she has never smoked. She has never used smokeless tobacco. She reports that she does not currently use alcohol. She reports that she does not use drugs. The patient is married and lives in [place]. She has an adult daughter with disabilites that she takes care of.    ALLERGIES: Patient has no known allergies.   MEDICATIONS:  Current Outpatient Medications  Medication Sig Dispense Refill   acetaminophen (TYLENOL) 325 MG tablet Take 2 tablets (650 mg total) by mouth every 6 (six) hours as needed for mild pain, moderate pain or fever. (Patient not taking: Reported on 05/12/2021) 20 tablet 0   Coenzyme Q10 (CO Q 10 PO) Take 300 mg by mouth at bedtime.     dabrafenib mesylate (TAFINLAR) 75 MG capsule Take 2 capsules (150 mg total) by mouth 2 (two) times daily. Take on an empty stomach 1 hour before or 2 hours after meals. 120 capsule 3   DANDELION PO Take 1 capsule by mouth daily.     diphenhydrAMINE-zinc acetate  (BENADRYL) cream Apply topically as needed for itching (as needed for itching as labeled). 28.4 g 0   dronabinol (MARINOL) 5 MG capsule Take 1 capsule (5 mg total) by mouth 2 (two) times daily before a meal. 60 capsule 2   feeding supplement (ENSURE ENLIVE / ENSURE PLUS) LIQD Take 237 mLs by mouth 3 (three) times daily between meals. 237 mL 12   ibuprofen (ADVIL) 200 MG tablet Take 400 mg by mouth every 6 (six) hours as needed for mild pain.     IVERMECTIN PO Take 1.8 mLs by mouth daily. Ivermectin oil - 10 mg/ml     letrozole (FEMARA) 2.5 MG tablet Take 1 tablet (2.5 mg total) by mouth daily. 30 tablet 2   metFORMIN (GLUCOPHAGE) 500 MG tablet Take 1 tablet (500 mg total) by mouth 3 (three) times daily. 90 tablet 5   methocarbamol (ROBAXIN) 500 MG tablet Take 1 tablet (500 mg total) by mouth every 6 (six) hours as needed for muscle spasms. 20 tablet 0   Misc Natural Products (MAGIC MUSHROOM MIX) CAPS Take 1 tablet by mouth daily.     Multiple  Vitamins-Minerals (MULTIVITAMIN WITH MINERALS) tablet Take 1 tablet by mouth daily.     NP THYROID 60 MG tablet Take 60 mg by mouth daily.     ondansetron (ZOFRAN) 8 MG tablet Take 1 tablet (8 mg total) by mouth every 8 (eight) hours as needed for nausea or vomiting. 20 tablet 0   OVER THE COUNTER MEDICATION Take 2 capsules by mouth 3 (three) times daily. Protectival     polyethylene glycol (MIRALAX / GLYCOLAX) 17 g packet Take 17 g by mouth daily. (Patient taking differently: Take 17 g by mouth daily as needed for moderate constipation.) 14 each 0   senna (SENOKOT) 8.6 MG TABS tablet Take 1 tablet (8.6 mg total) by mouth daily. 120 tablet 0   trametinib dimethyl sulfoxide (MEKINIST) 2 MG tablet Take 1 tablet (2 mg total) by mouth daily. Take 1 hour before or 2 hours after a meal. Store refrigerated in original container. 30 tablet 3   Zinc 100 MG TABS Take 100 mg by mouth daily.     No current facility-administered medications for this encounter.      REVIEW OF SYSTEMS: On review of systems, the patient reports she is doing fairly well. Her breathing is her main concern. She is not on any oxygen but is having shortness of breath with little exertion. She reports she has some headaches as well when spending time on her computer. She otherwise is not having acute headaches, visual or auditory changes, or unintended movement.     PHYSICAL EXAM:  Unable to assess due to encounter type.   ECOG = 1  0 - Asymptomatic (Fully active, able to carry on all predisease activities without restriction)  1 - Symptomatic but completely ambulatory (Restricted in physically strenuous activity but ambulatory and able to carry out work of a light or sedentary nature. For example, light housework, office work)  2 - Symptomatic, <50% in bed during the day (Ambulatory and capable of all self care but unable to carry out any work activities. Up and about more than 50% of waking hours)  3 - Symptomatic, >50% in bed, but not bedbound (Capable of only limited self-care, confined to bed or chair 50% or more of waking hours)  4 - Bedbound (Completely disabled. Cannot carry on any self-care. Totally confined to bed or chair)  5 - Death   Eustace Pen MM, Creech RH, Tormey DC, et al. [phone]). "Toxicity and response criteria of the [facility]". [place] Oncol. 5 (6): 649-55    LABORATORY DATA:  Lab Results  Component Value Date   WBC 9.7 04/14/2021   HGB 9.7 (L) 04/14/2021   HCT 30.1 (L) 04/14/2021   MCV 83.6 04/14/2021   PLT 571 (H) 04/14/2021   Lab Results  Component Value Date   NA 134 (L) 04/14/2021   K 4.1 04/14/2021   CL 96 (L) 04/14/2021   CO2 28 04/14/2021   Lab Results  Component Value Date   ALT 10 04/14/2021   AST 15 04/14/2021   ALKPHOS 94 04/14/2021   BILITOT 0.3 04/14/2021      RADIOGRAPHY: DG Chest 1 View  Result Date: 05/11/2021 CLINICAL DATA:  Post LEFT-sided thoracentesis.  Some reported pain.  EXAM: CHEST  1 VIEW COMPARISON:  May 03, 2021. FINDINGS: Complete opacification of the LEFT hemithorax persists. Minimal aerated lung may be present in the mid chest as seen on recent chest CT. There is no substantial change in the appearance of the chest when compared  to the examination of May 03, 2021. Cardiomediastinal contours are stable with obscured LEFT heart border and shift of heart into the RIGHT chest. RIGHT lung is clear. On limited assessment there is no acute skeletal process. IMPRESSION: Persistent complete opacification of the LEFT hemithorax with minimal aerated lung in the mid chest as seen on recent chest CT. No significant change in the appearance of the chest when compared to the recent chest radiograph. No visible pneumothorax. Electronically Signed   By: Zetta Bills M.D.   On: 05/11/2021 14:58   DG Chest 1 View  Result Date: 05/03/2021 CLINICAL DATA:  Pleural effusion post LEFT thoracentesis, history diabetes mellitus, lung cancer EXAM: CHEST  1 VIEW COMPARISON:  04/15/2021 FINDINGS: Complete opacification of the LEFT hemithorax by combination of pleural effusion and atelectasis versus consolidation of LEFT lung. Slight mediastinal shift LEFT to RIGHT. Underlying mass LEFT chest not excluded. Heart size stable. RIGHT lung hyperinflated but clear. No pneumothorax or acute osseous findings. IMPRESSION: No pneumothorax following LEFT thoracentesis. Complete opacification of the LEFT hemithorax by persistent large LEFT pleural effusion combined with atelectasis versus consolidation of the LEFT lung, underlying abnormalities not excluded. Electronically Signed   By: Lavonia Dana M.D.   On: 05/03/2021 14:01   MR Brain W Wo Contrast  Result Date: 05/22/2021 CLINICAL DATA:  Non-small-cell lung cancer. Follow-up metastatic disease response to treatment. EXAM: MRI HEAD WITHOUT AND WITH CONTRAST TECHNIQUE: Multiplanar, multiecho pulse sequences of the brain and surrounding  structures were obtained without and with intravenous contrast. CONTRAST:  47m MULTIHANCE GADOBENATE DIMEGLUMINE 529 MG/ML IV SOLN COMPARISON:  MRI head with contrast 04/03/2021 FINDINGS: Brain: Progression of multiple enhancing lesions the brain compatible with metastatic disease. The largest lesions in the left parietal lobe show increased white matter edema. Right temporal periventricular white matter lesion 2 mm with progression. Axial image 76 1 mm lesion left gyrus rectus with progression.  Axial image 77 4 mm cortical lesion in the right lateral occipital lobe with progression. Axial image 94 Linear enhancement in the right occipital lobe may represent vascular enhancement versus metastatic disease. Attention on follow-up. Axial image 91 Linear enhancement right frontal lobe on axial image 102, May be vascular enhancement versus cortical metastasis. 3 mm lesion right frontal lobe has progressed.  Axial image 104 1 mm lesion left parietal lobe axial image 110 has progressed. 8 mm enhancing lesion left parietal lobe has progressed. Axial image 132 7 mm enhancing lesion left parietal lobe has progressed. Axial image 134 2 mm enhancement left parietal lobe has progressed axial image 140 1 mm enhancing lesion left frontal cortex over the convexity has progressed. Axial image 139 Ventricle size normal. No midline shift. Interval improvement in small punctate areas of restricted diffusion in the white matter bilaterally likely resolving infarcts. No intracranial hemorrhage Vascular: Normal arterial flow voids Skull and upper cervical spine: No focal skeletal abnormality. Sinuses/Orbits: Mild mucosal edema paranasal sinuses. Negative orbit Other: None IMPRESSION: Progression of metastatic disease brain. Numerous small lesions are present. The largest lesions left parietal lobe shows increased edema. Improving areas of small vessel infarction in the white matter bilaterally. Electronically Signed   By: CFranchot Gallo M.D.   On: 05/22/2021 11:29   CT CHEST ABDOMEN PELVIS W CONTRAST  Result Date: 05/07/2021 CLINICAL DATA:  Metastatic non-small cell lung cancer in a 70year old female. Assess treatment response. EXAM: CT CHEST, ABDOMEN, AND PELVIS WITH CONTRAST TECHNIQUE: Multidetector CT imaging of the chest, abdomen and pelvis was performed following the standard  protocol during bolus administration of intravenous contrast. CONTRAST:  168m OMNIPAQUE IOHEXOL 300 MG/ML  SOLN COMPARISON:  Comparison is made with August of 2022. FINDINGS: CT CHEST FINDINGS Cardiovascular: Heart size is normal. Aortic caliber is normal. Increased shift of mediastinal structures from LEFT to RIGHT when compared to the prior study due to enlarging LEFT-sided pleural effusion with malignant features. Small to moderate volume pericardial effusion is slightly increased as well more circumferential, preferentially along the RIGHT heart on the previous study. Mediastinum/Nodes: Subcarinal adenopathy difficult to separate from the esophagus on today's study, grossly similar approximately 16 mm short axis. No signs of new adenopathy Lungs/Pleura: LEFT lower lobe mass 9.0 x 7.7 cm (image 36/2) previously approximately 8.0 x 6.7 cm. Increasing pleural fluid in the LEFT upper chest with complete collapse of the LEFT upper lobe. Increasing pleural based nodularity along the LEFT upper chest (image 12/2) 15 x 7 mm. Scattered smaller areas of nodularity are new as well. Near confluent eccentric 180 degree pleural thickening in the LEFT mid chest (image 46/2) greatest thickness ranging between 8 and 12 mm not seen on previous imaging. This is contiguous with the dominant mass sub pulmonic fluid may be decreased in volume with the increase in volume in the upper lobe but there is further septation with respect to pleural fluid in the LEFT chest. In the LEFT costodiaphragmatic recess inferiorly is another example of new nodularity (image 56/2) 13 x 10 mm.  Musculoskeletal: See below for full musculoskeletal details. CT ABDOMEN PELVIS FINDINGS Hepatobiliary: No focal, suspicious hepatic lesion. No pericholecystic stranding. No biliary duct dilation. Portal vein is patent. Pancreas: Normal, without mass, inflammation or ductal dilatation. Spleen: Normal spleen. Adrenals/Urinary Tract: Adrenal glands are normal. Symmetric renal enhancement with signs of mild renal cortical scarring on the LEFT. No hydronephrosis. Urinary bladder with smooth contours. Stomach/Bowel: Normal appendix.  No acute gastrointestinal findings. Vascular/Lymphatic: Mild aortic atherosclerosis in the distal thoracic and throughout the abdominal aorta tracking in the iliac vessels. No aneurysmal dilation. Smooth contour of the IVC. There is no gastrohepatic or hepatoduodenal ligament lymphadenopathy. No retroperitoneal or mesenteric lymphadenopathy. No pelvic sidewall lymphadenopathy. Reproductive: Post hysterectomy without signs of adnexal mass Other: No ascites. Musculoskeletal: L1 sclerosis with similar appearance to prior imaging no acute or destructive bone process. IMPRESSION: Enlarging LEFT-sided pleural effusion with malignant features, now with complete collapse of the LEFT upper lobe. Only a very small amount of aerated lung remains visible about the LEFT hilum in the LEFT chest and despite volume loss with increasing shift of mediastinal structures from LEFT to RIGHT. Mediastinal shift is approximately 3-4 cm greater than on previous imaging due to the enlargement of the LEFT-sided pleural effusion Increasing pleural nodularity and enlarging central pulmonary mass indicative of worsening of disease in the chest. Small to moderate volume pericardial effusion is slightly increased as well as more circumferential, preferentially along the RIGHT heart on the previous study. No evidence of metastatic disease involving the abdomen or pelvis. L1 bony metastasis with similar appearance. Aortic  atherosclerosis. These results will be called to the ordering clinician or representative by the Radiologist Assistant, and communication documented in the PACS or [employer] Electronically Signed   By: GZetta BillsM.D.   On: 05/07/2021 16:18   IR THORACENTESIS ASP PLEURAL SPACE W/IMG GUIDE  Result Date: 05/11/2021 INDICATION: History of lung cancer with recurrent left pleural effusion. Request for therapeutic and diagnostic thoracentesis. EXAM: ULTRASOUND GUIDED LEFT THORACENTESIS MEDICATIONS: 15 mL 1% lidocaine COMPLICATIONS: None immediate. PROCEDURE: An  ultrasound guided thoracentesis was thoroughly discussed with the patient and questions answered. The benefits, risks, alternatives and complications were also discussed. The patient understands and wishes to proceed with the procedure. Written consent was obtained. Ultrasound was performed to localize and mark an adequate pocket of fluid in the left chest. The area was then prepped and draped in the normal sterile fashion. 1% Lidocaine was used for local anesthesia. Under ultrasound guidance a 19 gauge, 7-cm, Yueh catheter was introduced. Thoracentesis was performed. The catheter was removed and a dressing applied. FINDINGS: A total of approximately 950 cc of clear yellow fluid was removed. Samples were sent to the laboratory as requested by the clinical team. Post procedure chest X-ray reviewed, negative for pneumothorax. IMPRESSION: Successful ultrasound guided left thoracentesis yielding 950 cc of pleural fluid. Read by: Durenda Guthrie, PA-C Electronically Signed   By: Miachel Roux M.D.   On: 05/11/2021 15:34   IR THORACENTESIS ASP PLEURAL SPACE W/IMG GUIDE  Result Date: 05/03/2021 INDICATION: Patient with history of metastatic lung cancer, recurrent left pleural effusion. Request to IR for therapeutic thoracentesis. EXAM: ULTRASOUND GUIDED LEFT THORACENTESIS MEDICATIONS: 5 mL% lidocaine COMPLICATIONS: None immediate. PROCEDURE: An ultrasound  guided thoracentesis was thoroughly discussed with the patient and questions answered. The benefits, risks, alternatives and complications were also discussed. The patient understands and wishes to proceed with the procedure. Written consent was obtained. Ultrasound was performed to localize and mark an adequate pocket of fluid in the left chest. The area was then prepped and draped in the normal sterile fashion. 1% Lidocaine was used for local anesthesia. Under ultrasound guidance a 6 Fr Safe-T-Centesis catheter was introduced. Thoracentesis was performed. The catheter was removed and a dressing applied. FINDINGS: A total of approximately 1.4 L of hazy yellow fluid was removed. IMPRESSION: Successful ultrasound guided left thoracentesis yielding 1.4 L of pleural fluid. Read by Candiss Norse, PA-C Electronically Signed   By: Miachel Roux M.D.   On: 05/03/2021 14:26        IMPRESSION/PLAN: 1. 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 The patient continues on BRAF inhibitors as well as antiestrogen therapy. She unfortunately has progressive disease systemically in her chest and in her brain. She is going for bronchoscopy and pleurex catheter placed on Friday with Dr. Valeta Harms. We discussed the results of her recent MRI brain and discussion from brain oncology conference. She would be a candidate for whole brain radiation. I discussed while SRS is not prohibitive, this modality would not be preferred because she likely has microscopic amounts of brain disease and since her systemic disease is not controlled, additional disease could form and multiple SRS treatments could make it much more difficult to later come back with whole brain radiation after prior therapies.  We discussed the risks, benefits, short, and long term effects of whole brain radiotherapy, as well  as the palliative intent, and the patient wants to take some time to decide how she'd like to proceed. I asked her to call me back once she's decided, but initially during our call she was leaning toward waiting several months prior to therapy which I told her I did not think was a good option, and that in that interval, neurologic symptoms she's trying to avoid would likely occur and even more so create risks to her quality of life. She also reports she has been taking her Mekinist  regularly, but has only been back on full dose of Tafinlar for 2 weeks, and is not taking Femara, in place of this she's been taking Dim, and iodine. I encouraged her to reconsider resuming Femara.   Given current concerns for patient exposure during the COVID-19 pandemic, this encounter was conducted via telephone.  The patient has provided two factor identification and has given verbal consent for this type of encounter and has been advised to only accept a meeting of this type in a secure network environment. The time spent during this encounter was 45 minutes including preparation, discussion, and coordination of the patient's care. The attendants for this meeting include   Hayden Pedro  and Vanna Scotland as well as Mr. Boruff and their daughter. During the encounter,  Hayden Pedro was located at [facility] Radiation Oncology Department.  Brandyce Kosanke was located at home with her husband and daughter.         Carola Rhine, PAC

## 2021-05-25 ENCOUNTER — Other Ambulatory Visit: Payer: Self-pay | Admitting: Pulmonary Disease

## 2021-05-26 ENCOUNTER — Other Ambulatory Visit: Payer: Self-pay

## 2021-05-26 ENCOUNTER — Telehealth: Payer: Self-pay | Admitting: Radiation Oncology

## 2021-05-26 ENCOUNTER — Encounter (HOSPITAL_COMMUNITY): Payer: Self-pay | Admitting: Pulmonary Disease

## 2021-05-26 LAB — SARS CORONAVIRUS 2 (TAT 6-24 HRS): SARS Coronavirus 2: NEGATIVE

## 2021-05-26 NOTE — Progress Notes (Signed)
PCP - Dr. Shelia Media  Cardiologist - Denies  EP- Denies  Endocrine- Denies  Pulm- Dr. Jacinto Reap. Icard  Chest x-ray - 05/11/21 (E)  EKG - 01/28/21 (E)  Stress Test - Denies  ECHO - 01/18/21 (E)  Cardiac Cath - Denies  AICD-na PM-na LOOP-na  Dialysis- Denies  Sleep Study - Denies CPAP - Denies  LABS- 05/28/21: CBC  ASA- Denies  ERAS- No  HA1C- 01/30/21 (E): 6.2 Fasting Blood Sugar - 0 Checks Blood Sugar ___0__ times a day- Pt states she is a pre-diabetic, and even though she takes Metformin, she does not check her blood sugars at home at this time.  Anesthesia- No  Pt denies having chest pain or fever, but does sob daily due to her lung issues during the pre-op phone call. All instructions explained to the pt, with a verbal understanding of the material including: as of today, stop taking all Aspirin (unless instructed by your doctor) and Other Aspirin containing products, Vitamins, Fish oils, and Herbal medications. Also stop all NSAIDS i.e. Advil, Ibuprofen, Motrin, Aleve, Anaprox, Naproxen, BC, Goody Powders, and all Supplements.  Pt also instructed to wear a mask and social distance after being tested for COVID-19. The opportunity to ask questions was provided.    Coronavirus Screening  Have you experienced the following symptoms:  Cough yes/no: No Fever (>100.18F)  yes/no: No Runny nose yes/no: No Sore throat yes/no: No Difficulty breathing/shortness of breath  yes/no: No  Have you or a family member traveled in the last 14 days and where? yes/no: No   If the patient indicates "YES" to the above questions, their PAT will be rescheduled to limit the exposure to others and, the surgeon will be notified. THE PATIENT WILL NEED TO BE ASYMPTOMATIC FOR 14 DAYS.   If the patient is not experiencing any of these symptoms, the PAT nurse will instruct them to NOT bring anyone with them to their appointment since they may have these symptoms or traveled as well.   Please remind  your patients and families that hospital visitation restrictions are in effect and the importance of the restrictions.

## 2021-05-26 NOTE — Telephone Encounter (Signed)
I called the patient to follow up and see if she had made a decision about her therapy. I left a voicemail asking her to call back to let us know how she'd like to proceed.

## 2021-05-28 ENCOUNTER — Ambulatory Visit (HOSPITAL_COMMUNITY): Payer: Medicare Other | Admitting: Anesthesiology

## 2021-05-28 ENCOUNTER — Encounter (HOSPITAL_COMMUNITY): Payer: Self-pay | Admitting: Pulmonary Disease

## 2021-05-28 ENCOUNTER — Telehealth: Payer: Self-pay | Admitting: Pulmonary Disease

## 2021-05-28 ENCOUNTER — Ambulatory Visit (HOSPITAL_COMMUNITY)
Admission: RE | Admit: 2021-05-28 | Discharge: 2021-05-28 | Disposition: A | Discharge status: Home / Self Care | Payer: Medicare Other | Attending: Pulmonary Disease | Admitting: Pulmonary Disease

## 2021-05-28 ENCOUNTER — Ambulatory Visit (HOSPITAL_COMMUNITY): Payer: Medicare Other

## 2021-05-28 ENCOUNTER — Encounter: Payer: Self-pay | Admitting: *Deleted

## 2021-05-28 ENCOUNTER — Encounter (HOSPITAL_COMMUNITY)
Admission: RE | Disposition: A | Discharge status: Home / Self Care | Payer: Self-pay | Source: Home / Self Care | Attending: Pulmonary Disease

## 2021-05-28 DIAGNOSIS — R0600 Dyspnea, unspecified: Secondary | ICD-10-CM | POA: Insufficient documentation

## 2021-05-28 DIAGNOSIS — J948 Other specified pleural conditions: Secondary | ICD-10-CM | POA: Insufficient documentation

## 2021-05-28 DIAGNOSIS — Z853 Personal history of malignant neoplasm of breast: Secondary | ICD-10-CM | POA: Diagnosis not present

## 2021-05-28 DIAGNOSIS — Z9689 Presence of other specified functional implants: Secondary | ICD-10-CM

## 2021-05-28 DIAGNOSIS — J9819 Other pulmonary collapse: Secondary | ICD-10-CM | POA: Insufficient documentation

## 2021-05-28 DIAGNOSIS — C3492 Malignant neoplasm of unspecified part of left bronchus or lung: Secondary | ICD-10-CM | POA: Diagnosis not present

## 2021-05-28 DIAGNOSIS — J910 Malignant pleural effusion: Secondary | ICD-10-CM | POA: Diagnosis present

## 2021-05-28 HISTORY — PX: VIDEO BRONCHOSCOPY: SHX5072

## 2021-05-28 HISTORY — PX: CHEST TUBE INSERTION: SHX231

## 2021-05-28 HISTORY — DX: Dyspnea, unspecified: R06.00

## 2021-05-28 LAB — GLUCOSE, CAPILLARY
Glucose-Capillary: 122 mg/dL — ABNORMAL HIGH (ref 70–99)
Glucose-Capillary: 113 mg/dL — ABNORMAL HIGH (ref 70–99)

## 2021-05-28 IMAGING — DX DG CHEST 1V PORT
1 series · 1 of 1 positions shown · non-contrast
Comparison: Portable exam [JO] hours compared to [DATE]

CLINICAL DATA: Chest tube, history lung cancer, multiple prior
thoracentesis procedures

EXAM:
PORTABLE CHEST 1 VIEW

[chest ap]
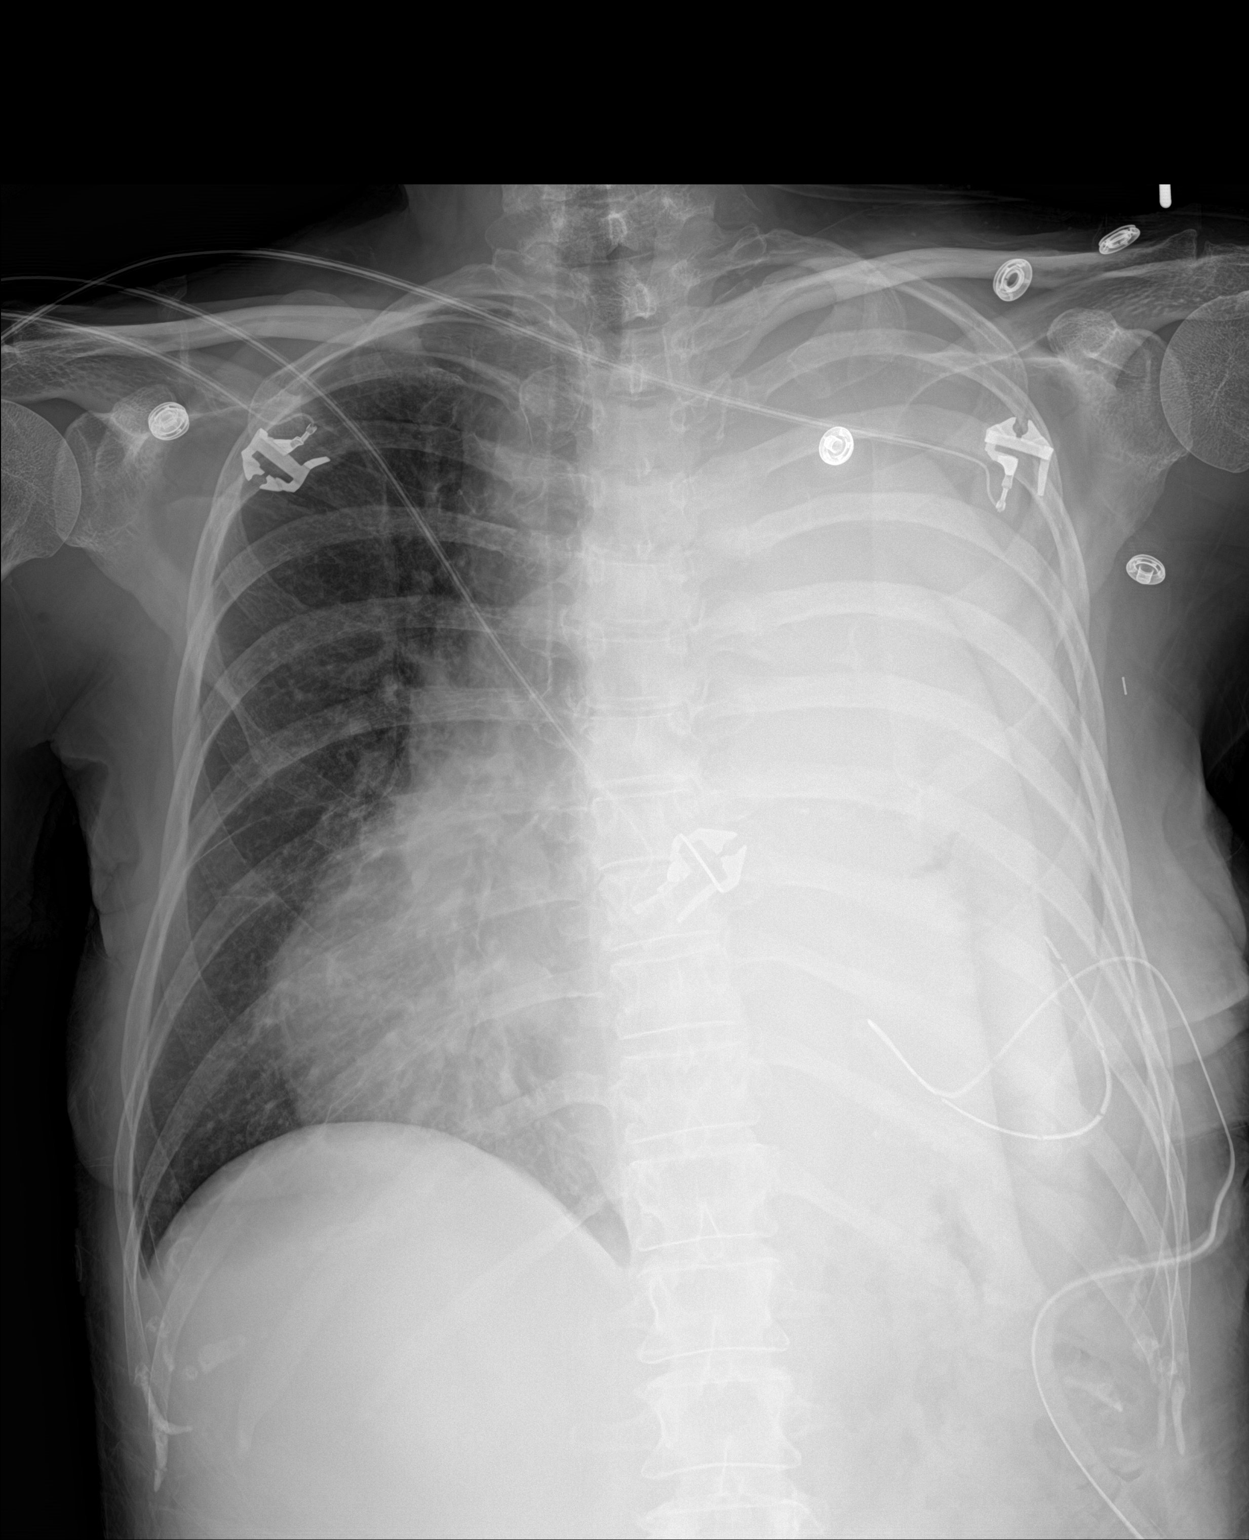

[1 of 1 positions shown; findings below may reference images not displayed]

FINDINGS: Complete opacification of the LEFT hemithorax by large pleural
effusion and accompanying LEFT lung atelectasis/consolidation; known
LEFT lung mass obscured.

LEFT PleurX catheter identified at inferior LEFT hemithorax.

Mediastinal shift to the RIGHT.

RIGHT lung otherwise clear.

Skin folds project over LEFT chest without pneumothorax.

Bones demineralized.
IMPRESSION: Complete opacification of the LEFT hemithorax by LEFT pleural
effusion, known LEFT lung mass and LEFT lung
atelectasis/consolidation.

## 2021-05-28 SURGERY — VIDEO BRONCHOSCOPY WITHOUT FLUORO
Anesthesia: General | Laterality: Left

## 2021-05-28 MED ORDER — DEXAMETHASONE SODIUM PHOSPHATE 10 MG/ML IJ SOLN
INTRAMUSCULAR | Status: DC | PRN
  Administered 2021-05-28: 10 mg via INTRAVENOUS

## 2021-05-28 MED ORDER — ONDANSETRON HCL 4 MG/2ML IJ SOLN
INTRAMUSCULAR | Status: DC | PRN
  Administered 2021-05-28: 4 mg via INTRAVENOUS

## 2021-05-28 MED ORDER — EPHEDRINE SULFATE-NACL 50-0.9 MG/10ML-% IV SOSY
PREFILLED_SYRINGE | INTRAVENOUS | Status: DC | PRN
  Administered 2021-05-28: 5 mg via INTRAVENOUS

## 2021-05-28 MED ORDER — ONDANSETRON HCL 4 MG/2ML IJ SOLN
INTRAMUSCULAR | Status: AC
  Filled 2021-05-28: qty 2

## 2021-05-28 MED ORDER — FENTANYL CITRATE (PF) 100 MCG/2ML IJ SOLN: 25.0000 ug | INTRAMUSCULAR | Status: DC | PRN

## 2021-05-28 MED ORDER — OXYCODONE HCL 5 MG/5ML PO SOLN: 5.0000 mg | Freq: Once | ORAL | Status: DC | PRN

## 2021-05-28 MED ORDER — LACTATED RINGERS IV SOLN: INTRAVENOUS | Status: DC

## 2021-05-28 MED ORDER — ONDANSETRON HCL 4 MG/2ML IJ SOLN
4.0000 mg | Freq: Once | INTRAMUSCULAR | Status: AC | PRN
  Administered 2021-05-28: 4 mg via INTRAVENOUS

## 2021-05-28 MED ORDER — ROCURONIUM BROMIDE 10 MG/ML (PF) SYRINGE
PREFILLED_SYRINGE | INTRAVENOUS | Status: DC | PRN
  Administered 2021-05-28 (×2): 50 mg via INTRAVENOUS

## 2021-05-28 MED ORDER — AMISULPRIDE (ANTIEMETIC) 5 MG/2ML IV SOLN: 10.0000 mg | Freq: Once | INTRAVENOUS | Status: DC | PRN

## 2021-05-28 MED ORDER — SUGAMMADEX SODIUM 200 MG/2ML IV SOLN
INTRAVENOUS | Status: DC | PRN
  Administered 2021-05-28: 200 mg via INTRAVENOUS

## 2021-05-28 MED ORDER — PHENYLEPHRINE HCL-NACL 20-0.9 MG/250ML-% IV SOLN
INTRAVENOUS | Status: DC | PRN
  Administered 2021-05-28: 40 ug/min via INTRAVENOUS

## 2021-05-28 MED ORDER — PROPOFOL 10 MG/ML IV BOLUS
INTRAVENOUS | Status: DC | PRN
  Administered 2021-05-28: 120 mg via INTRAVENOUS

## 2021-05-28 MED ORDER — OXYCODONE HCL 5 MG PO TABS: 5.0000 mg | ORAL_TABLET | Freq: Once | ORAL | Status: DC | PRN

## 2021-05-28 MED ORDER — LIDOCAINE 2% (20 MG/ML) 5 ML SYRINGE
INTRAMUSCULAR | Status: DC | PRN
  Administered 2021-05-28: 100 mg via INTRAVENOUS

## 2021-05-28 MED ORDER — FENTANYL CITRATE (PF) 250 MCG/5ML IJ SOLN
INTRAMUSCULAR | Status: DC | PRN
  Administered 2021-05-28: 100 ug via INTRAVENOUS

## 2021-05-28 MED ORDER — CHLORHEXIDINE GLUCONATE 0.12 % MT SOLN
OROMUCOSAL | Status: AC
  Administered 2021-05-28: 15 mL
  Filled 2021-05-28: qty 15

## 2021-05-28 MED ORDER — PHENYLEPHRINE 40 MCG/ML (10ML) SYRINGE FOR IV PUSH (FOR BLOOD PRESSURE SUPPORT)
PREFILLED_SYRINGE | INTRAVENOUS | Status: DC | PRN
  Administered 2021-05-28: 80 ug via INTRAVENOUS
  Administered 2021-05-28: 160 ug via INTRAVENOUS
  Administered 2021-05-28 (×2): 80 ug via INTRAVENOUS

## 2021-05-28 NOTE — Interval H&P Note (Signed)
History and Physical Interval Note:  05/28/2021 7:06 AM  Tami Stone  has presented today for surgery, with the diagnosis of lung mass, pleural effusion.  The various methods of treatment have been discussed with the patient and family. After consideration of risks, benefits and other options for treatment, the patient has consented to  Procedure(s) with comments: VIDEO BRONCHOSCOPY WITHOUT FLUORO (Left) INSERTION PLEURAL DRAINAGE CATHETER (Left) - Prior to bronchoscopy as a surgical intervention.  The patient's history has been reviewed, patient examined, no change in status, stable for surgery.  I have reviewed the patient's chart and labs.  Questions were answered to the patient's satisfaction.     [place]

## 2021-05-28 NOTE — Transfer of Care (Signed)
Immediate Anesthesia Transfer of Care Note  Patient: Tami Stone  Procedure(s) Performed: VIDEO BRONCHOSCOPY WITHOUT FLUORO (Left) INSERTION PLEURAL DRAINAGE CATHETER (Left)  Patient Location: PACU  Anesthesia Type:General  Level of Consciousness: drowsy  Airway & Oxygen Therapy: Patient Spontanous Breathing and Patient connected to nasal cannula oxygen  Post-op Assessment: Report given to RN and Post -op Vital signs reviewed and stable  Post vital signs: Reviewed and stable  Last Vitals:  Vitals Value Taken Time  BP    Temp    Pulse    Resp    SpO2      Last Pain:  Vitals:   05/28/21 0612  TempSrc: Oral  PainSc:       Patients Stated Pain Goal: 2 (24/58/09 9833)  Complications: No notable events documented.

## 2021-05-28 NOTE — Anesthesia Postprocedure Evaluation (Signed)
Anesthesia Post Note  Patient: Tami Stone  Procedure(s) Performed: VIDEO BRONCHOSCOPY WITHOUT FLUORO (Left) INSERTION PLEURAL DRAINAGE CATHETER (Left)     Patient location during evaluation: PACU Anesthesia Type: General Level of consciousness: awake and alert Pain management: pain level controlled Vital Signs Assessment: post-procedure vital signs reviewed and stable Respiratory status: spontaneous breathing, nonlabored ventilation and respiratory function stable Cardiovascular status: blood pressure returned to baseline and stable Postop Assessment: no apparent nausea or vomiting Anesthetic complications: no   No notable events documented.  Last Vitals:  Vitals:   05/28/21 0940 05/28/21 1010  BP: 105/79 98/74  Pulse: (!) 108 (!) 107  Resp: (!) 28 (!) 34  Temp:  (!) 36.3 C  SpO2: (!) 88% 91%    Last Pain:  Vitals:   05/28/21 1010  TempSrc:   PainSc: 0-No pain                 Lidia Collum

## 2021-05-28 NOTE — Anesthesia Preprocedure Evaluation (Addendum)
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, stage 4   Pulmonary exam normal        Cardiovascular negative cardio ROS Normal cardiovascular exam   Echo 01/18/21: EF 55-60%, g1dd, nl RVSF, moderate circumferential pericardial effusion, no evidence of tamponade, mild MR   Neuro/Psych negative neurological ROS     GI/Hepatic negative GI ROS, Neg liver ROS,   Endo/Other  diabetes, Oral Hypoglycemic Agents  Renal/GU negative Renal ROS  negative genitourinary   Musculoskeletal negative musculoskeletal ROS (+)   Abdominal   Peds  Hematology  (+) anemia , Hgb 9.7   Anesthesia Other Findings   Reproductive/Obstetrics                            Anesthesia Physical Anesthesia Plan  ASA: 3  Anesthesia Plan: General   Post-op Pain Management: Minimal or no pain anticipated   Induction: Intravenous  PONV Risk Score and Plan: 3 and Ondansetron, Dexamethasone, Treatment may vary due to age or medical condition and Midazolam  Airway Management Planned: Oral ETT  Additional Equipment: None  Intra-op Plan:   Post-operative Plan: Extubation in OR  Informed Consent: I have reviewed the patients History and Physical, chart, labs and discussed the procedure including the risks, benefits and alternatives for the proposed anesthesia with the patient or authorized representative who has indicated his/her understanding and acceptance.     Dental advisory  given  Plan Discussed with:   Anesthesia Plan Comments:         Anesthesia Quick Evaluation

## 2021-05-28 NOTE — Telephone Encounter (Signed)
Received response back from Dr. Valeta Harms that he would like for the patient to have daily drainings. They are aware to call us if the fluid gets below 50cc. Verbalized understanding.   Nothing further needed at time of call.

## 2021-05-28 NOTE — Anesthesia Procedure Notes (Signed)
Procedure Name: Intubation Date/Time: 05/28/2021 7:45 AM Performed by: Vonna Drafts, CRNA Pre-anesthesia Checklist: Patient identified, Emergency Drugs available, Suction available and Patient being monitored Patient Re-evaluated:Patient Re-evaluated prior to induction Oxygen Delivery Method: Circle system utilized Preoxygenation: Pre-oxygenation with 100% oxygen Induction Type: IV induction Ventilation: Mask ventilation without difficulty Laryngoscope Size: Mac and 3 Grade View: Grade I Tube type: Oral Tube size: 8.5 mm Number of attempts: 1 Airway Equipment and Method: Stylet Placement Confirmation: ETT inserted through vocal cords under direct vision, positive ETCO2 and breath sounds checked- equal and bilateral Secured at: 22 cm Tube secured with: Tape Dental Injury: Teeth and Oropharynx as per pre-operative assessment

## 2021-05-28 NOTE — Progress Notes (Signed)
Patient had pleurx cath placement and bronch with biopsies today. Will follow for path results.   Oncology Nurse Navigator Documentation  Oncology Nurse Navigator Flowsheets 05/28/2021  Abnormal Finding Date -  Confirmed Diagnosis Date -  Diagnosis Status -  Navigator Follow Up Date: 06/01/2021  Navigator Follow Up Reason: Pathology  Navigation Complete Date: -  Post Navigation: Continue to Follow Patient? -  Reason Not Navigating Patient: -  Navigator Location [facility]  Referral Date to RadOnc/MedOnc -  Navigator Encounter Type Appt/Treatment Plan Review  Telephone -  [facility] Date -  Multidisiplinary Clinic Type -  Patient Visit Type MedOnc  Treatment Phase Active Tx  Barriers/Navigation Needs Coordination of Care;Education  Education -  Interventions None Required  Acuity Level 2-Minimal Needs (1-2 Barriers Identified)  Coordination of Care -  Education Method -  Support Groups/Services Friends and Family  Time Spent with Patient 15

## 2021-05-28 NOTE — Telephone Encounter (Signed)
Epic sent to Dr. Valeta Harms since he is in office today.

## 2021-05-28 NOTE — Op Note (Signed)
Video Bronchoscopy Procedure Note  Date of Operation: 05/28/2021  Pre-op Diagnosis: Malignant pleural effusion, lung collapse  Post-op Diagnosis: Malignant pleural effusion, lung collapse  Surgeon: [name], DO  Assistants: none  Anesthesia: General  Operation: Flexible video fiberoptic bronchoscopy and biopsies.  Estimated Blood Loss: 0 cc  Complications: none noted  Indications and History: Tami Stone is 70 y.o. with history of stage IV lung cancer, breast cancer, malignant pleural effusion, lung collapse.  Recommendation was to perform video fiberoptic bronchoscopy with biopsies. The risks, benefits, complications, treatment options and expected outcomes were discussed with the patient.  The possibilities of pneumothorax, pneumonia, reaction to medication, pulmonary aspiration, perforation of a viscus, bleeding, failure to diagnose a condition and creating a complication requiring transfusion or operation were discussed with the patient who freely signed the consent.    Description of Procedure: The patient was seen in the Preoperative Area, was examined and was deemed appropriate to proceed.  The patient was taken to [facility] endoscopy room 1, identified as [name] and the procedure verified as Flexible Video Fiberoptic Bronchoscopy.  A Time Out was held and the above information confirmed.   Standard bronchoscope was inserted through the patient's endotracheal tube placed by anesthesia.  The main carina and bilateral mainstem's were examined.  The right lung appeared normal there was no evidence of endobronchial lesion.  There was extrinsic compression of the distal airways in the left lower lobe, lingula was extrinsically compressed.  The left upper lobe openings were open.  However the submucosa along this lining did appear erythematous and inflamed possibly consistent with underlying malignancy however she has had ongoing treatments which may represent some of  these changes.  There was no visible endobronchial tumor within the airway causing the obstruction.  No centrally obstructing mass.  After placement of the Pleurx catheter and nearly a liter fluid of drainage the lower lobe airways began to open during the case.  Samples: 1.  No samples  Plans:  We will review the cytology, pathology and microbiology results with the patient when they become available.  Outpatient followup will be with Dr. [name], DO   [name], DO [facility] 05/28/2021 8:50 AM

## 2021-05-28 NOTE — Op Note (Signed)
Left PleurX Insertion Procedure Note  Gracee Ratterree  [id number]  30-Mar-1951  Date:05/28/21  Time:8:49 AM   Provider Performing:Lititia Sen L Teandre Hamre  Procedure: PleurX Tunneled Pleural Catheter Placement (67591)  Indication(s) Relief of dyspnea from recurrent effusion, malignant pleural effusion   Consent Risks of the procedure as well as the alternatives and risks of each were explained to the patient and/or caregiver.  Consent for the procedure was obtained.  Anesthesia Topical only with 1% lidocaine   Time Out Verified patient identification, verified procedure, site/side was marked, verified correct patient position, special equipment/implants available, medications/allergies/relevant history reviewed, required imaging and test results available.  Sterile Technique Maximal sterile technique including sterile barrier drape, hand hygiene, sterile gown, sterile gloves, mask, hair covering.  Procedure Description Ultrasound used to identify appropriate pleural anatomy for placement and overlying skin marked.  Area of drainage cleaned and draped in sterile fashion.   Lidocaine was used to anesthetize the skin and subcutaneous tissue.   1.5 cm incision made overlying fluid and another about 5 cm anterior to this along chest wall.  PleurX catheter inserted in usual sterile fashion using modified seldinger technique.  Interrupted silk sutures placed at catheter insertion and tunneling points which will be removed at later date.  PleurX catheter then hooked to suction.  After fluid aspirated, pleurX capped and sterile dressing applied.  Complications/Tolerance None; patient tolerated the procedure well. Chest X-ray is ordered to confirm no post-procedural complication.  EBL Minimal  Specimen(s) none   [name], DO [facility] 05/28/2021 8:49 AM

## 2021-05-30 ENCOUNTER — Encounter (HOSPITAL_COMMUNITY): Payer: Self-pay | Admitting: Pulmonary Disease

## 2021-05-30 LAB — POCT I-STAT, CHEM 8
BUN: 13 mg/dL (ref 8–23)
Calcium, Ion: 1.11 mmol/L — ABNORMAL LOW (ref 1.15–1.40)
Chloride: 95 mmol/L — ABNORMAL LOW (ref 98–111)
Creatinine, Ser: 0.5 mg/dL (ref 0.44–1.00)
Glucose, Bld: 122 mg/dL — ABNORMAL HIGH (ref 70–99)
HCT: 29 % — ABNORMAL LOW (ref 36.0–46.0)
Hemoglobin: 9.9 g/dL — ABNORMAL LOW (ref 12.0–15.0)
Potassium: 3.9 mmol/L (ref 3.5–5.1)
Sodium: 133 mmol/L — ABNORMAL LOW (ref 135–145)
TCO2: 27 mmol/L (ref 22–32)

## 2021-05-31 ENCOUNTER — Telehealth: Payer: Self-pay | Admitting: Pulmonary Disease

## 2021-05-31 ENCOUNTER — Telehealth: Payer: Self-pay | Admitting: Radiation Oncology

## 2021-05-31 NOTE — Telephone Encounter (Signed)
I called the patient and she has not decided how she would like to move forward. Dr. Lisbeth Renshaw has offered whole brain radiation. She is not in favor of this at this time. She states she doesn't know what she wants to do but isn't ready to proceed with radiation. I asked her to call us if she makes a decision to proceed with therapy. She is aware that additional imaging would be determined on if she receives treatment. I will also notify Dr. Marin Olp as well.

## 2021-05-31 NOTE — Telephone Encounter (Signed)
I called the home health nurse back and per the note on 05/28/21 the catheter to drain once a day and to call the office if the amount is less than 50 ML. She voices understanding. Nothing further needed.

## 2021-06-01 ENCOUNTER — Encounter: Payer: Self-pay | Admitting: *Deleted

## 2021-06-01 NOTE — Progress Notes (Signed)
Review for path results revealed that pathology specimens were not sent. No path to review. Dr Marin Olp to follow up with patient tomorrow.   Oncology Nurse Navigator Documentation  Oncology Nurse Navigator Flowsheets 06/01/2021  Abnormal Finding Date -  Confirmed Diagnosis Date -  Diagnosis Status -  Navigator Follow Up Date: 06/02/2021  Navigator Follow Up Reason: Follow-up Appointment  Navigation Complete Date: -  Post Navigation: Continue to Follow Patient? -  Reason Not Navigating Patient: -  Navigator Location [facility]  Referral Date to RadOnc/MedOnc -  Navigator Encounter Type Pathology Review  Telephone -  [facility] Date -  Multidisiplinary Clinic Type -  Patient Visit Type MedOnc  Treatment Phase Active Tx  Barriers/Navigation Needs Coordination of Care;Education  Education -  Interventions None Required  Acuity Level 2-Minimal Needs (1-2 Barriers Identified)  Coordination of Care -  Education Method -  Support Groups/Services Friends and Family  Time Spent with Patient 15

## 2021-06-02 ENCOUNTER — Inpatient Hospital Stay: Payer: Medicare Other

## 2021-06-02 ENCOUNTER — Encounter: Payer: Self-pay | Admitting: Hematology & Oncology

## 2021-06-02 ENCOUNTER — Telehealth: Payer: Self-pay | Admitting: Pulmonary Disease

## 2021-06-02 ENCOUNTER — Other Ambulatory Visit: Payer: Self-pay

## 2021-06-02 ENCOUNTER — Inpatient Hospital Stay (HOSPITAL_BASED_OUTPATIENT_CLINIC_OR_DEPARTMENT_OTHER): Payer: Medicare Other | Admitting: Hematology & Oncology

## 2021-06-02 VITALS — BP 85/59 | HR 95 | Temp 98.3°F | Resp 18 | Ht 62.0 in | Wt 122.1 lb

## 2021-06-02 DIAGNOSIS — D510 Vitamin B12 deficiency anemia due to intrinsic factor deficiency: Secondary | ICD-10-CM

## 2021-06-02 DIAGNOSIS — E039 Hypothyroidism, unspecified: Secondary | ICD-10-CM

## 2021-06-02 DIAGNOSIS — Z9221 Personal history of antineoplastic chemotherapy: Secondary | ICD-10-CM | POA: Diagnosis not present

## 2021-06-02 DIAGNOSIS — C3492 Malignant neoplasm of unspecified part of left bronchus or lung: Secondary | ICD-10-CM | POA: Diagnosis not present

## 2021-06-02 DIAGNOSIS — C3432 Malignant neoplasm of lower lobe, left bronchus or lung: Secondary | ICD-10-CM | POA: Diagnosis present

## 2021-06-02 DIAGNOSIS — Z79811 Long term (current) use of aromatase inhibitors: Secondary | ICD-10-CM | POA: Diagnosis not present

## 2021-06-02 DIAGNOSIS — Z7984 Long term (current) use of oral hypoglycemic drugs: Secondary | ICD-10-CM | POA: Diagnosis not present

## 2021-06-02 DIAGNOSIS — C7951 Secondary malignant neoplasm of bone: Secondary | ICD-10-CM | POA: Diagnosis not present

## 2021-06-02 DIAGNOSIS — Z170 Estrogen receptor positive status [ER+]: Secondary | ICD-10-CM | POA: Diagnosis not present

## 2021-06-02 DIAGNOSIS — C50912 Malignant neoplasm of unspecified site of left female breast: Secondary | ICD-10-CM | POA: Diagnosis not present

## 2021-06-02 DIAGNOSIS — Z79899 Other long term (current) drug therapy: Secondary | ICD-10-CM | POA: Diagnosis not present

## 2021-06-02 DIAGNOSIS — C782 Secondary malignant neoplasm of pleura: Secondary | ICD-10-CM | POA: Diagnosis not present

## 2021-06-02 DIAGNOSIS — C7931 Secondary malignant neoplasm of brain: Secondary | ICD-10-CM | POA: Diagnosis not present

## 2021-06-02 LAB — CMP (CANCER CENTER ONLY)
ALT: 9 U/L (ref 0–44)
AST: 13 U/L — ABNORMAL LOW (ref 15–41)
Albumin: 3.1 g/dL — ABNORMAL LOW (ref 3.5–5.0)
Alkaline Phosphatase: 112 U/L (ref 38–126)
Anion gap: 10 (ref 5–15)
BUN: 17 mg/dL (ref 8–23)
CO2: 28 mmol/L (ref 22–32)
Calcium: 9.2 mg/dL (ref 8.9–10.3)
Chloride: 93 mmol/L — ABNORMAL LOW (ref 98–111)
Creatinine: 0.76 mg/dL (ref 0.44–1.00)
GFR, Estimated: >60 mL/min (ref >60)
Glucose, Bld: 102 mg/dL — ABNORMAL HIGH (ref 70–99)
Potassium: 4.2 mmol/L (ref 3.5–5.1)
Sodium: 131 mmol/L — ABNORMAL LOW (ref 135–145)
Total Bilirubin: 0.2 mg/dL — ABNORMAL LOW (ref 0.3–1.2)
Total Protein: 6.6 g/dL (ref 6.5–8.1)

## 2021-06-02 LAB — CBC WITH DIFFERENTIAL (CANCER CENTER ONLY)
Abs Immature Granulocytes: 0.11 10*3/uL — ABNORMAL HIGH (ref 0.00–0.07)
Basophils Absolute: 0.1 10*3/uL (ref 0.0–0.1)
Basophils Relative: 1 %
Eosinophils Absolute: 0.7 10*3/uL — ABNORMAL HIGH (ref 0.0–0.5)
Eosinophils Relative: 7 %
HCT: 31.3 % — ABNORMAL LOW (ref 36.0–46.0)
Hemoglobin: 9.9 g/dL — ABNORMAL LOW (ref 12.0–15.0)
Immature Granulocytes: 1 %
Lymphocytes Relative: 18 %
Lymphs Abs: 1.8 10*3/uL (ref 0.7–4.0)
MCH: 26.1 pg (ref 26.0–34.0)
MCHC: 31.6 g/dL (ref 30.0–36.0)
MCV: 82.4 fL (ref 80.0–100.0)
Monocytes Absolute: 0.8 10*3/uL (ref 0.1–1.0)
Monocytes Relative: 8 %
Neutro Abs: 6.3 10*3/uL (ref 1.7–7.7)
Neutrophils Relative %: 65 %
Platelet Count: 662 10*3/uL — ABNORMAL HIGH (ref 150–400)
RBC: 3.8 MIL/uL — ABNORMAL LOW (ref 3.87–5.11)
RDW: 17.9 % — ABNORMAL HIGH (ref 11.5–15.5)
WBC Count: 9.7 10*3/uL (ref 4.0–10.5)
nRBC: 0 % (ref 0.0–0.2)

## 2021-06-02 LAB — RETICULOCYTES
Immature Retic Fract: 13.1 % (ref 2.3–15.9)
RBC.: 3.8 MIL/uL — ABNORMAL LOW (ref 3.87–5.11)
Retic Count, Absolute: 74.9 10*3/uL (ref 19.0–186.0)
Retic Ct Pct: 2 % (ref 0.4–3.1)

## 2021-06-02 LAB — LACTATE DEHYDROGENASE: LDH: 137 U/L (ref 98–192)

## 2021-06-02 LAB — VITAMIN B12: Vitamin B-12: 462 pg/mL (ref 180–914)

## 2021-06-02 NOTE — Telephone Encounter (Signed)
Attempted to call Kathlee Nations but unable to reach. Left message for her to return call.

## 2021-06-02 NOTE — Telephone Encounter (Signed)
Called and spoke with Tami Stone about the drain amount that pt has been getting out. Dr. Valeta Harms, please advise on the new info from Tami Stone as she states that the amount has varied over the past few days and she is wanting to know if she can drain every other day or less than that as she only got 75 out today, 100 yesterday 12/20, and she didn't even drain it 12/19.

## 2021-06-02 NOTE — Telephone Encounter (Signed)
Disregard

## 2021-06-03 ENCOUNTER — Telehealth: Payer: Self-pay | Admitting: Hematology & Oncology

## 2021-06-03 ENCOUNTER — Encounter: Payer: Self-pay | Admitting: *Deleted

## 2021-06-03 LAB — IRON AND IRON BINDING CAPACITY (CC-WL,HP ONLY)
Iron: <5 ug/dL — ABNORMAL LOW (ref 28–170)
Saturation Ratios: UNDETERMINED % (ref 10.4–31.8)
TIBC: 168 ug/dL — ABNORMAL LOW (ref 250–450)
UIBC: 176 ug/dL (ref 148–442)

## 2021-06-03 LAB — FERRITIN: Ferritin: 515 ng/mL — ABNORMAL HIGH (ref 11–307)

## 2021-06-03 NOTE — Telephone Encounter (Signed)
Called patient per 06/02/21 LOS - unable to reach patient. Left message for patient with appointment date and time

## 2021-06-03 NOTE — Progress Notes (Signed)
Hematology and Oncology Follow Up Visit  Tami Stone [id number] 1950-06-23 70 y.o. 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  She definitely has progressive disease.  She had a CT scan done about a month ago.  This shows increasing the left lower lobe mass.  Now measures 9 x 7.7 cm.  There is increased pleural nodularity along the left chest.  There is increasing adenopathy.  Surprisingly, she just does not have any disease below the diaphragm.  She did undergo a bronchoscopy.  This was done on 05/28/2021.  Unfortunately, a stent cannot be placed.  It looks there is extrinsic compression over on the left bronchial airways.  She did have a Pleurx catheter placed.  I think this was a good idea.  She has had the CNS metastasis.  She has had radiosurgery for these.  It looks like there is some question as to whether or not she is going to have whole brain radiation.  She really is not decided about this.  She is comes in a wheelchair.  She certainly is weaker.  Her overall performance status is probably ECOG 2.  Would not have to make a change in our protocol.  She just is not responding to the Mekinist/Tafinlar protocol.  She really wants to avoid chemotherapy.  I talked her about radiation therapy to the left chest.  She would like to try to avoid that.  Think an option that we might have is immunotherapy.  The 1 issue with immunotherapy is the fact that it can take several weeks before you start to see a response, if there is a response.  She does not have much of a good appetite.  Her albumin keeps going down.   Today it is 3.1.  She is having problems swallowing.  There may be a stricture in the esophagus.  I think we will get had to have her see Gastroenterology and see if they can do a upper endoscopy to try to help with this if that is the case.  It is possible that she may have adenopathy present on the esophagus.  I told her that she needs to get in more protein.  I think milkshakes with protein powder would not be a bad idea for her.  I think eggs would also be a good idea.  Anything that is soft and easy to swallow would not be a bad idea.  I just hate that where are losing ground.  I know she is doing all that she can do.  She has had a lot of support from her family.  I know she has been very diligent with her complementary therapies.  Thankfully, she is not hurting.  There is no pain.  She is not describing any kind of shortness of breath.  She is not having any diarrhea.  There is some intermittent constipation maybe with some diarrhea.  She is having no visual issues.  Currently, I would have to say that her performance status is probably ECOG 2.     Medications:  Current Outpatient Medications:    Black Pepper-Turmeric (TURMERIC PLUS BLACK PEPPER EXT PO), Take 1 capsule  by mouth in the morning, at noon, and at bedtime., Disp: , Rfl:    Coenzyme Q10 (CO Q 10 PO), Take 300 mg by mouth at bedtime., Disp: , Rfl:    dabrafenib mesylate (TAFINLAR) 75 MG capsule, Take 2 capsules (150 mg total) by mouth 2 (two) times daily. Take on an empty stomach 1 hour before or 2 hours after meals., Disp: 120 capsule, Rfl: 3   DANDELION PO, Take 2,250 mg by mouth daily with supper., Disp: , Rfl:    Diindolylmethane POWD, Take 200 mg by mouth every evening. DIM 200 mg, Disp: , Rfl:    Flavoring Agent (APRICOT FLAVOR) POWD, Take 2 capsules by mouth daily. Apricot Seed Supplement, Disp: , Rfl:    ibuprofen (ADVIL) 200 MG tablet, Take 400 mg by mouth every 6 (six) hours as needed for mild pain., Disp: , Rfl:     IODINE, KELP, PO, Take 1 capsule by mouth in the morning., Disp: , Rfl:    IVERMECTIN PO, Take 2 mLs by mouth in the morning. Ivermectin oil - 10 mg/ml, Disp: , Rfl:    letrozole (FEMARA) 2.5 MG tablet, Take 1 tablet (2.5 mg total) by mouth daily., Disp: 30 tablet, Rfl: 2   metFORMIN (GLUCOPHAGE) 500 MG tablet, Take 1 tablet (500 mg total) by mouth 3 (three) times daily., Disp: 90 tablet, Rfl: 5   Misc Natural Products (MAGIC MUSHROOM MIX) CAPS, Take 1 tablet by mouth daily., Disp: , Rfl:    NP THYROID 60 MG tablet, Take 60 mg by mouth daily., Disp: , Rfl:    OVER THE COUNTER MEDICATION, Take 2 capsules by mouth 3 (three) times daily. Protectival, Disp: , Rfl:    polyethylene glycol (MIRALAX / GLYCOLAX) 17 g packet, Take 17 g by mouth daily. (Patient taking differently: Take 17 g by mouth daily as needed for moderate constipation.), Disp: 14 each, Rfl: 0   Prenatal Vit-Fe Fumarate-FA (MULTIVITAMIN-PRENATAL) 27-0.8 MG TABS tablet, Take 1 tablet by mouth daily at 12 noon., Disp: , Rfl:    trametinib dimethyl sulfoxide (MEKINIST) 2 MG tablet, Take 1 tablet (2 mg total) by mouth daily. Take 1 hour before or 2 hours after a meal. Store refrigerated in original container., Disp: 30 tablet, Rfl: 3   Zinc 100 MG TABS, Take 100 mg by mouth daily. (Patient not taking: Reported on 06/02/2021), Disp: , Rfl:   Allergies: No Known Allergies  Past Medical History, Surgical history, Social history, and Family History were reviewed and updated.  Review of Systems: Review of Systems  Constitutional:  Positive for fatigue.  HENT:  Negative.    Eyes: Negative.   Respiratory:  Positive for cough.   Cardiovascular: Negative.   Gastrointestinal: Negative.   Endocrine: Positive for hot flashes.  Genitourinary: Negative.    Musculoskeletal:  Positive for arthralgias and myalgias.  Skin: Negative.   Neurological:  Positive for dizziness and light-headedness.  Hematological: Negative.   Psychiatric/Behavioral:  Negative.     Physical Exam:  height is _0  (1.575 m) and weight is 122 lb 1.3 oz (55.4 kg). Her oral temperature is 98.3 F (36.8 C). Her blood pressure is 85/59 (abnormal) and her pulse is 95. Her respiration is 18 and oxygen saturation is 100%.   Wt Readings from Last 3 Encounters:  06/02/21 122 lb 1.3 oz (55.4 kg)  05/26/21 115 lb (52.2 kg)  05/12/21 120 lb 6.4 oz (54.6 kg)    Physical Exam Vitals reviewed.  HENT:     Head: Normocephalic  and atraumatic.  Eyes:     Pupils: Pupils are equal, round, and reactive to light.  Cardiovascular:     Rate and Rhythm: Normal rate and regular rhythm.     Heart sounds: Normal heart sounds.  Pulmonary:     Effort: Pulmonary effort is normal.     Breath sounds: Normal breath sounds.     Comments: Her lungs sound relatively clear on the right side.  There is some decrease on the left side.  I do not hear any wheezing.  There is no rhonchi. Abdominal:     General: Bowel sounds are normal.     Palpations: Abdomen is soft.  Musculoskeletal:        General: No tenderness or deformity. Normal range of motion.     Cervical back: Normal range of motion.  Lymphadenopathy:     Cervical: No cervical adenopathy.  Skin:    General: Skin is warm and dry.     Findings: No erythema or rash.     Comments: Under the left breast at about the 5-6 o'clock position, there is a nodule.  It is mobile.  It is nontender.  Probably measures about 8 x 4 mm.  Neurological:     Mental Status: She is alert and oriented to person, place, and time.  Psychiatric:        Behavior: Behavior normal.        Thought Content: Thought content normal.        Judgment: Judgment normal.     Lab Results  Component Value Date   WBC 9.7 06/02/2021   HGB 9.9 (L) 06/02/2021   HCT 31.3 (L) 06/02/2021   MCV 82.4 06/02/2021   PLT 662 (H) 06/02/2021     Chemistry      Component Value Date/Time   NA 131 (L) 06/02/2021 1514   K 4.2 06/02/2021 1514   CL 93 (L) 06/02/2021  1514   CO2 28 06/02/2021 1514   BUN 17 06/02/2021 1514   CREATININE 0.76 06/02/2021 1514      Component Value Date/Time   CALCIUM 9.2 06/02/2021 1514   ALKPHOS 112 06/02/2021 1514   AST 13 (L) 06/02/2021 1514   ALT 9 06/02/2021 1514   BILITOT 0.2 (L) 06/02/2021 1514      Impression and Plan: Tami Stone is a very nice 70 year old white female.  She has 2 separate malignancies.  Her most "active" malignancy is the lung cancer.  She is progressing.  This CT scan shows it.  She has a bronchoscopy.  I think her overall performance status indicates that she is progressing.  We will see about immunotherapy.  I will try Yervoy/Opdivo.  I think this is reasonable.  I told her about the side effects.  I told her about diarrhea.  I told her about the possibility of her thyroid not working right.  I told her about the possibility of liver inflammation.  I spoke to her about the potential for a rash.  She understands all of this.  She would like to proceed with therapy.  We will try to get her started next week.  I will have to talk to Gastroenterology to see about an upper endoscopy for her.  We need to see if there is any Stricture.  We really need to get more nutrition into her.  I would read that she has a Pleurx catheter in.  I told her that she can probably open it up every 2 or 3 days depending  on how much fluid is in there.  If she has less than 100 cc of fluid that is removed, then she can gradually increase the time between drainage.  I think the big hurdle is going to be nutrition.  We have to get better nutrition into her.  We will try to get started with treatment next week.  Hopefully, insurance will cover this and approve this.  I would like to see her back when she has her second cycle of treatment.  I will do 4 cycles of combination immunotherapy and then follow-up with our scans.  I think that her performance status will tell us how she is doing.     Volanda Napoleon,  MD 12/22/20227:16 AM

## 2021-06-03 NOTE — Progress Notes (Signed)
Patient will start immunotherapy next week. She will need chemo education prior to start.  We will schedule patient to start treatment on Thursday when our chemo educator is here. Message sent to educator.   Oncology Nurse Navigator Documentation  Oncology Nurse Navigator Flowsheets 06/03/2021  Abnormal Finding Date -  Confirmed Diagnosis Date -  Diagnosis Status -  Navigator Follow Up Date: 06/10/2021  Navigator Follow Up Reason: Chemotherapy  Navigation Complete Date: -  Post Navigation: Continue to Follow Patient? -  Reason Not Navigating Patient: -  Navigator Location [facility]  Referral Date to RadOnc/MedOnc -  Navigator Encounter Type Appt/Treatment Plan Review  Telephone -  Multidisiplinary Clinic Date -  Multidisiplinary Clinic Type -  Patient Visit Type MedOnc  Treatment Phase Active Tx  Barriers/Navigation Needs Coordination of Care;Education  Education -  Interventions Coordination of Care  Acuity Level 2-Minimal Needs (1-2 Barriers Identified)  Coordination of Care Appts  Education Method -  Support Groups/Services Friends and Family  Time Spent with Patient 30

## 2021-06-03 NOTE — Telephone Encounter (Signed)
Called and spoke with Kathlee Nations letting her know the info per Dr. Valeta Harms about pt's drain and emptying it. Kathlee Nations verbalized understanding. Nothing further needed.

## 2021-06-03 NOTE — Progress Notes (Unsigned)
Pharmacist Chemotherapy Monitoring - Initial Assessment    Anticipated start date: ***   The following has been reviewed per standard work regarding the patient's treatment regimen: The patient's diagnosis, treatment plan and drug doses, and organ/hematologic function Lab orders and baseline tests specific to treatment regimen  The treatment plan start date, drug sequencing, and pre-medications Prior authorization status  Patient's documented medication list, including drug-drug interaction screen and prescriptions for anti-emetics and supportive care specific to the treatment regimen The drug concentrations, fluid compatibility, administration routes, and timing of the medications to be used The patient's access for treatment and lifetime cumulative dose history, if applicable  The patient's medication allergies and previous infusion related reactions, if applicable   Changes made to treatment plan:  treatment plan date  Follow up needed:  Make sure baseline TSH drawn on day 1 of treatment   Janan Bogie, Jacqlyn Larsen, [facility], 06/03/2021  1:58 PM

## 2021-06-09 ENCOUNTER — Telehealth: Payer: Self-pay

## 2021-06-09 NOTE — Telephone Encounter (Signed)
Scheduled 1-3 at 120 and 1-5 at 2pm for [facility]

## 2021-06-09 NOTE — Telephone Encounter (Signed)
LVM for patient. Patient needs to be set up with EGD and OV next week with Dr Loletha Grayer

## 2021-06-10 ENCOUNTER — Ambulatory Visit: Payer: Medicare Other

## 2021-06-10 ENCOUNTER — Encounter: Payer: Self-pay | Admitting: *Deleted

## 2021-06-10 ENCOUNTER — Other Ambulatory Visit: Payer: Medicare Other

## 2021-06-10 NOTE — Progress Notes (Signed)
Patient has rescheduled her first treatment for 06/18/2021. Her chemo education has been scheduled for the same day.   Oncology Nurse Navigator Documentation  Oncology Nurse Navigator Flowsheets 06/10/2021  Abnormal Finding Date -  Confirmed Diagnosis Date -  Diagnosis Status -  Navigator Follow Up Date: 06/18/2021  Navigator Follow Up Reason: Chemotherapy;Chemo Class  Navigation Complete Date: -  Post Navigation: Continue to Follow Patient? 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

## 2021-06-15 ENCOUNTER — Encounter: Payer: Self-pay | Admitting: Gastroenterology

## 2021-06-15 ENCOUNTER — Other Ambulatory Visit: Payer: Self-pay

## 2021-06-15 ENCOUNTER — Ambulatory Visit (INDEPENDENT_AMBULATORY_CARE_PROVIDER_SITE_OTHER): Payer: Medicare Other | Admitting: Gastroenterology

## 2021-06-15 VITALS — BP 100/62 | HR 95 | Ht 62.0 in | Wt 116.0 lb

## 2021-06-15 DIAGNOSIS — C50512 Malignant neoplasm of lower-outer quadrant of left female breast: Secondary | ICD-10-CM

## 2021-06-15 DIAGNOSIS — K219 Gastro-esophageal reflux disease without esophagitis: Secondary | ICD-10-CM

## 2021-06-15 DIAGNOSIS — C3492 Malignant neoplasm of unspecified part of left bronchus or lung: Secondary | ICD-10-CM

## 2021-06-15 DIAGNOSIS — R1310 Dysphagia, unspecified: Secondary | ICD-10-CM | POA: Diagnosis not present

## 2021-06-15 DIAGNOSIS — J90 Pleural effusion, not elsewhere classified: Secondary | ICD-10-CM | POA: Diagnosis not present

## 2021-06-15 DIAGNOSIS — Z170 Estrogen receptor positive status [ER+]: Secondary | ICD-10-CM

## 2021-06-15 NOTE — Progress Notes (Signed)
Chief Complaint: Dysphagia  Referring Provider:     Burney Gauze, MD   HPI:    Tami Stone is a 71 y.o. female with a history of metastatic adenocarcinoma of the left lung (metastasis to bone, brain, pleural mets), stage III invasive ductal carcinoma of the left breast, malignant pleural effusion, HLD, diabetes, referred to the Gastroenterology Clinic for evaluation of dysphagia.  She follows with Dr. Marin Olp in the Hematology-Oncology Clinic for progressive metastatic disease.  Most recent CT Chest in November with increasing left lower lobe mass measuring 9 x 7.7 cm, increased pleural nodularity of the left chest, mediastinal shift 3-4 cm, and increasing adenopathy.  No metastatic disease of the abdomen..  Bronchoscopy performed on 05/28/2021 by Dr. Valeta Harms and m/f extrinsic compression over left bronchial airway; Pleurx catheter was placed (no bronchial stent placement).  She has had radiosurgery for CNS metastasis.  Most recent brain MRI in 05/2021 with progression of metastatic disease of the brain with numerous small lesions.  No previous chest radiation.  She is referred to the GI clinic for evaluation of progressive solid food dysphagia.  Sxs have been progressice over last several months. Does ok with liquids. Points to lower sternal border. She has been cutting her food into small pieces and chewing thoroughly, but still with dysphagia and episodes of food regurgitation.  Appetite preserved p.o. intake reduced due to symptoms. Rare reflux sxs, and started Nexium 20 mg/day which improved reflux symptoms but without much change in dysphagia sxs.   She does take several supplements as outlined below.  Does not take any antiplatelet therapy or anticoagulation.   No previous upper endoscopy or colonoscopy.  CBC Latest Ref Rng & Units 06/02/2021 05/28/2021 04/14/2021  WBC 4.0 - 10.5 K/uL 9.7 - 9.7  Hemoglobin 12.0 - 15.0 g/dL 9.9(L) 9.9(L) 9.7(L)  Hematocrit 36.0 -  46.0 % 31.3(L) 29.0(L) 30.1(L)  Platelets 150 - 400 K/uL 662(H) - 571(H)   CMP Latest Ref Rng & Units 06/02/2021 05/28/2021 04/14/2021  Glucose 70 - 99 mg/dL 102(H) 122(H) 148(H)  BUN 8 - 23 mg/dL 17 13 17   Creatinine 0.44 - 1.00 mg/dL 0.76 0.50 0.75  Sodium 135 - 145 mmol/L 131(L) 133(L) 134(L)  Potassium 3.5 - 5.1 mmol/L 4.2 3.9 4.1  Chloride 98 - 111 mmol/L 93(L) 95(L) 96(L)  CO2 22 - 32 mmol/L 28 - 28  Calcium 8.9 - 10.3 mg/dL 9.2 - 9.5  Total Protein 6.5 - 8.1 g/dL 6.6 - 6.7  Total Bilirubin 0.3 - 1.2 mg/dL 0.2(L) - 0.3  Alkaline Phos 38 - 126 U/L 112 - 94  AST 15 - 41 U/L 13(L) - 15  ALT 0 - 44 U/L 9 - 10      Past Medical History:  Diagnosis Date   Diabetes ([place])    Does not check bs at home   Dyspnea    High cholesterol    Lung cancer ([place])    04/2020     Past Surgical History:  Procedure Laterality Date   ABDOMINAL HYSTERECTOMY     BREAST BIOPSY Right    BRONCHIAL BIOPSY  04/30/2020   Procedure: BRONCHIAL BIOPSIES;  Surgeon: Collene Gobble, MD;  Location: [facility] ENDOSCOPY;  Service: Cardiopulmonary;;   BRONCHIAL BRUSHINGS  04/30/2020   Procedure: BRONCHIAL BRUSHINGS;  Surgeon: Collene Gobble, MD;  Location[facility] ENDOSCOPY;  Service: Cardiopulmonary;;   BRONCHIAL BRUSHINGS  01/20/2021   Procedure: BRONCHIAL BRUSHINGS;  Surgeon: Ander Slade,  Adewale A, MD;  Location: [facility] ENDOSCOPY;  Service: Endoscopy;;   BRONCHIAL WASHINGS  01/20/2021   Procedure: BRONCHIAL WASHINGS;  Surgeon: Laurin Coder, MD;  Location: [facility] ENDOSCOPY;  Service: Endoscopy;;   CHEST TUBE INSERTION Left 05/28/2021   Procedure: INSERTION PLEURAL DRAINAGE CATHETER;  Surgeon: [name], DO;  Location: [place];  Service: Pulmonary;  Laterality: Left;  Prior to bronchoscopy   HEMOSTASIS CONTROL  04/30/2020   Procedure: HEMOSTASIS CONTROL;  Surgeon: Collene Gobble, MD;  Location: [place];  Service: Cardiopulmonary;;   IR THORACENTESIS ASP PLEURAL SPACE W/IMG GUIDE  04/15/2021   IR  THORACENTESIS ASP PLEURAL SPACE W/IMG GUIDE  05/03/2021   IR THORACENTESIS ASP PLEURAL SPACE W/IMG GUIDE  05/11/2021   TUBAL LIGATION     VIDEO BRONCHOSCOPY N/A 04/30/2020   Procedure: VIDEO BRONCHOSCOPY WITH FLUORO;  Surgeon: Collene Gobble, MD;  Location: [place];  Service: Cardiopulmonary;  Laterality: N/A;   VIDEO BRONCHOSCOPY N/A 01/20/2021   Procedure: VIDEO BRONCHOSCOPY WITHOUT FLUORO;  Surgeon: Laurin Coder, MD;  Location: [facility] ENDOSCOPY;  Service: Endoscopy;  Laterality: N/A;   VIDEO BRONCHOSCOPY Left 05/28/2021   Procedure: VIDEO BRONCHOSCOPY WITHOUT FLUORO;  Surgeon: [name], DO;  Location: [place];  Service: Pulmonary;  Laterality: Left;   Family History  Problem Relation Age of Onset   Breast cancer Sister        half sister on maternal side   Colon cancer Neg Hx    Esophageal cancer Neg Hx    Social History   Tobacco Use   Smoking status: Never   Smokeless tobacco: Never  Vaping Use   Vaping Use: Never used  Substance Use Topics   Alcohol use: Not Currently   Drug use: Never   Current Outpatient Medications  Medication Sig Dispense Refill   AMBULATORY NON FORMULARY MEDICATION 2 capsules 2 (two) times daily. Medication Name: [place] tell mushroom supplement     Black Pepper-Turmeric (TURMERIC PLUS BLACK PEPPER EXT PO) Take 1 capsule by mouth in the morning, at noon, and at bedtime.     Coenzyme Q10 (CO Q 10 PO) Take 300 mg by mouth at bedtime.     dabrafenib mesylate (TAFINLAR) 75 MG capsule Take 2 capsules (150 mg total) by mouth 2 (two) times daily. Take on an empty stomach 1 hour before or 2 hours after meals. 120 capsule 3   DANDELION PO Take 2,250 mg by mouth daily with supper.     Diindolylmethane POWD Take 200 mg by mouth every evening. DIM 200 mg     Flavoring Agent (APRICOT FLAVOR) POWD Take 2 capsules by mouth daily. Apricot Seed Supplement     ibuprofen (ADVIL) 200 MG tablet Take 400 mg by mouth every 6 (six) hours as needed for mild  pain.     IODINE, KELP, PO Take 1 capsule by mouth in the morning.     IVERMECTIN PO Take 2 mLs by mouth in the morning. Ivermectin oil - 10 mg/ml     letrozole (FEMARA) 2.5 MG tablet Take 1 tablet (2.5 mg total) by mouth daily. 30 tablet 2   metFORMIN (GLUCOPHAGE) 500 MG tablet Take 1 tablet (500 mg total) by mouth 3 (three) times daily. 90 tablet 5   Misc Natural Products (MAGIC MUSHROOM MIX) CAPS Take 1 tablet by mouth daily.     NP THYROID 60 MG tablet Take 60 mg by mouth daily.     OVER THE COUNTER MEDICATION Take 2 capsules by mouth  3 (three) times daily. Protectival     polyethylene glycol (MIRALAX / GLYCOLAX) 17 g packet Take 17 g by mouth daily. (Patient taking differently: Take 17 g by mouth daily as needed for moderate constipation.) 14 each 0   Prenatal Vit-Fe Fumarate-FA (MULTIVITAMIN-PRENATAL) 27-0.8 MG TABS tablet Take 1 tablet by mouth daily at 12 noon.     trametinib dimethyl sulfoxide (MEKINIST) 2 MG tablet Take 1 tablet (2 mg total) by mouth daily. Take 1 hour before or 2 hours after a meal. Store refrigerated in original container. 30 tablet 3   Zinc 100 MG TABS Take 100 mg by mouth daily.     No current facility-administered medications for this visit.   No Known Allergies   Review of Systems: All systems reviewed and negative except where noted in HPI.     Physical Exam:    Wt Readings from Last 3 Encounters:  06/15/21 116 lb (52.6 kg)  06/02/21 122 lb 1.3 oz (55.4 kg)  05/26/21 115 lb (52.2 kg)    BP 100/62    Pulse 95    Ht 5\' 2"  (1.575 m)    Wt 116 lb (52.6 kg)    BMI 21.22 kg/m  Constitutional:  Pleasant, in no acute distress.  Presents in wheelchair accompanied by family member. Psychiatric: Normal mood and affect. Behavior is normal. Cardiovascular: Normal rate, regular rhythm. No edema Pulmonary/chest: Effort normal and breath sounds normal. No wheezing, rales or rhonchi. Abdominal: Soft, nondistended, nontender. Bowel sounds active throughout.   Neurological: Alert and oriented to person place and time. Skin: Skin is warm and dry. No rashes noted.   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  Discussed diagnostic options to include upper endoscopy vs barium esophagram, and she prefers the former - Plan for expedited EGD with possible esophageal dilation and/or biopsies as appropriate - We will discuss with Anesthesia staff regarding EGD in [place] - Continue cutting food into small pieces, chewing thoroughly, and drinking plenty of fluids with meals - Continue protein shakes, Ensure, boost, etc. for supplemental caloric intake - Patient to discuss Pleurx catheter management with Dr. Valeta Harms.  She has had minimal output and not sure if catheter position needs to be adjusted vs follow-up CT  The indications, risks, and benefits of EGD were explained to the patient in detail. Risks include but are not limited to bleeding, perforation, adverse reaction to medications, and cardiopulmonary compromise. Sequelae include but are not limited to the possibility of surgery, hospitalization, and mortality. The patient verbalized understanding and wished to proceed. All questions answered, referred to scheduler. Further recommendations pending results of the exam.     Lavena Bullion, DO, FACG  06/15/2021, 1:40 PM   Gaynelle Cage, MD Burney Gauze, MD

## 2021-06-15 NOTE — Patient Instructions (Addendum)
If you are age 72 or older, your body mass index should be between 23-30. Your There is no height or weight on file to calculate BMI. If this is out of the aforementioned range listed, please consider follow up with your Primary Care Provider.  If you are age 45 or younger, your body mass index should be between 19-25. Your There is no height or weight on file to calculate BMI. If this is out of the aformentioned range listed, please consider follow up with your Primary Care Provider.   __________________________________________________________  The [facility] providers would like to encourage you to use [facility] to communicate with providers for non-urgent requests or questions.  Due to long hold times on the telephone, sending your provider a message by [facility] may be a faster and more efficient way to get a response.  Please allow 48 business hours for a response.  Please remember that this is for non-urgent requests.    Due to recent changes in healthcare laws, you may see the results of your imaging and laboratory studies on MyChart before your provider has had a chance to review them.  We understand that in some cases there may be results that are confusing or concerning to you. Not all laboratory results come back in the same time frame and the provider may be waiting for multiple results in order to interpret others.  Please give [place] 48 hours in order for your provider to thoroughly review all the results before contacting the office for clarification of your results.    You have been scheduled for an endoscopy. Please follow written instructions given to you at your visit today. If you use inhalers (even only as needed), please bring them with you on the day of your procedure.   Thank you for choosing me and [facility].  Vito Cirigliano, D.O.

## 2021-06-16 ENCOUNTER — Encounter: Payer: Self-pay | Admitting: Hematology & Oncology

## 2021-06-17 ENCOUNTER — Ambulatory Visit (AMBULATORY_SURGERY_CENTER): Payer: Medicare Other | Admitting: Gastroenterology

## 2021-06-17 ENCOUNTER — Encounter: Payer: Self-pay | Admitting: Gastroenterology

## 2021-06-17 ENCOUNTER — Other Ambulatory Visit: Payer: Self-pay | Admitting: *Deleted

## 2021-06-17 ENCOUNTER — Other Ambulatory Visit: Payer: Self-pay

## 2021-06-17 VITALS — BP 131/85 | HR 100 | Temp 97.1°F | Resp 30 | Ht 62.0 in | Wt 116.0 lb

## 2021-06-17 DIAGNOSIS — J90 Pleural effusion, not elsewhere classified: Secondary | ICD-10-CM

## 2021-06-17 DIAGNOSIS — K219 Gastro-esophageal reflux disease without esophagitis: Secondary | ICD-10-CM

## 2021-06-17 DIAGNOSIS — R1310 Dysphagia, unspecified: Secondary | ICD-10-CM

## 2021-06-17 DIAGNOSIS — Z170 Estrogen receptor positive status [ER+]: Secondary | ICD-10-CM

## 2021-06-17 DIAGNOSIS — E039 Hypothyroidism, unspecified: Secondary | ICD-10-CM

## 2021-06-17 DIAGNOSIS — C3492 Malignant neoplasm of unspecified part of left bronchus or lung: Secondary | ICD-10-CM

## 2021-06-17 DIAGNOSIS — C50512 Malignant neoplasm of lower-outer quadrant of left female breast: Secondary | ICD-10-CM

## 2021-06-17 MED ORDER — SODIUM CHLORIDE 0.9 % IV SOLN: 500.0000 mL | Freq: Once | INTRAVENOUS | Status: DC

## 2021-06-17 NOTE — Progress Notes (Signed)
Report given to PACU, vss 

## 2021-06-17 NOTE — Progress Notes (Signed)
Pt's states no medical or surgical changes since previsit or office visit. 

## 2021-06-17 NOTE — Op Note (Signed)
[place] Patient Name: Tami Stone Procedure Date: 06/17/2021 3:19 PM MRN: [id number] Endoscopist: Gerrit Heck , MD Age: 71 Referring MD:  Date of Birth: 08-24-1950 Gender: Female Account #: [id number] Procedure:                Upper GI endoscopy Indications:              Dysphagia, Suspected esophageal reflux                           71 y.o. 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 Most recent CT Chest in November with                            increasing left lower lobe mass measuring 9 x 7.7                            cm, increased pleural nodularity of the left chest,                            mediastinal shift 3-4 cm. Medicines:                Monitored Anesthesia Care Procedure:                Pre-Anesthesia Assessment:                           - Prior to the procedure, a History and Physical                            was performed, and patient medications and                            allergies were reviewed. The patient's tolerance of                            previous anesthesia was also reviewed. The risks                            and benefits of the procedure and the sedation                            options and risks were discussed with the patient.                            All questions were answered, and informed consent  was obtained. Prior Anticoagulants: The patient has                            taken no previous anticoagulant or antiplatelet                            agents. ASA Grade Assessment: II - A patient with                            mild systemic disease. After reviewing the risks                            and benefits, the patient was deemed in                            satisfactory condition to undergo the  procedure.                           After obtaining informed consent, the endoscope was                            passed under direct vision. Throughout the                            procedure, the patient's blood pressure, pulse, and                            oxygen saturations were monitored continuously. The                            GIF [id number] #[id number] was introduced through the                            mouth, and advanced to the second part of duodenum.                            The upper GI endoscopy was accomplished without                            difficulty. The patient tolerated the procedure                            well. Scope In: Scope Out: Findings:                 A 5 cm area of extrinsic compression was found in                            the middle third of the esophagus, located 26-31 cm                            from the incisors. The overlying mucosa is  otherwise normal appearing. This was traversed with                            gentle endoscope pressure.                           The lower third of the esophagus and                            gastroesophageal junction were normal. Z line was                            normal located at 41 cm from the incisors.                           The entire examined stomach was normal.                           The examined duodenum was normal. Complications:            No immediate complications. Estimated Blood Loss:     Estimated blood loss: none. Impression:               - Extrinsic compression in the middle third of the                            esophagus consistent with known lung cancer. No                            intraluminal pathology that would be amenable to                            endoscopic intervention.                           - Normal lower third of esophagus and                            gastroesophageal junction.                           - Normal stomach.                            - Normal examined duodenum.                           - No specimens collected. Recommendation:           - Patient has a contact number available for                            emergencies. The signs and symptoms of potential                            delayed complications were discussed with the  patient. Return to normal activities tomorrow.                            Written discharge instructions were provided to the                            patient.                           - Resume previous diet.                           - Continue present medications.                           - Follow-up with Dr. Marin Olp in the                            Hematology-Oncology Clinic.                           - Depending on degree of dysphagia and ability to                            tolerate PO intake, may need to consider alternate                            means of nutrition, to include IR-placed PEG tube. Gerrit Heck, MD 06/17/2021 3:50:55 PM

## 2021-06-17 NOTE — Progress Notes (Signed)
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

## 2021-06-17 NOTE — Patient Instructions (Signed)
Resume previous medications.    YOU HAD AN ENDOSCOPIC PROCEDURE TODAY AT [facility]:   Refer to the procedure report that was given to you for any specific questions about what was found during the examination.  If the procedure report does not answer your questions, please call your gastroenterologist to clarify.  If you requested that your care partner not be given the details of your procedure findings, then the procedure report has been included in a sealed envelope for you to review at your convenience later.  YOU SHOULD EXPECT: Some feelings of bloating in the abdomen. Passage of more gas than usual.  Walking can help get rid of the air that was put into your GI tract during the procedure and reduce the bloating. If you had a lower endoscopy (such as a colonoscopy or flexible sigmoidoscopy) you may notice spotting of blood in your stool or on the toilet paper. If you underwent a bowel prep for your procedure, you may not have a normal bowel movement for a few days.  Please Note:  You might notice some irritation and congestion in your nose or some drainage.  This is from the oxygen used during your procedure.  There is no need for concern and it should clear up in a day or so.  SYMPTOMS TO REPORT IMMEDIATELY:   Following upper endoscopy (EGD)  Vomiting of blood or coffee ground material  New chest pain or pain under the shoulder blades  Painful or persistently difficult swallowing  New shortness of breath  Fever of 100F or higher  Black, tarry-looking stools  For urgent or emergent issues, a gastroenterologist can be reached at any hour by calling [phone]. Do not use MyChart messaging for urgent concerns.    DIET:  We do recommend a small meal at first, but then you may proceed to your regular diet.  Drink plenty of fluids but you should avoid alcoholic beverages for 24 hours.  ACTIVITY:  You should plan to take it easy for the rest of today and you should NOT  DRIVE or use heavy machinery until tomorrow (because of the sedation medicines used during the test).    FOLLOW UP: Our staff will call the number listed on your records 48-72 hours following your procedure to check on you and address any questions or concerns that you may have regarding the information given to you following your procedure. If we do not reach you, we will leave a message.  We will attempt to reach you two times.  During this call, we will ask if you have developed any symptoms of COVID 19. If you develop any symptoms (ie: fever, flu-like symptoms, shortness of breath, cough etc.) before then, please call [phone].  If you test positive for Covid 19 in the 2 weeks post procedure, please call and report this information to [place].    If any biopsies were taken you will be contacted by phone or by letter within the next 1-3 weeks.  Please call us at [phone] if you have not heard about the biopsies in 3 weeks.    SIGNATURES/CONFIDENTIALITY: You and/or your care partner have signed paperwork which will be entered into your electronic medical record.  These signatures attest to the fact that that the information above on your After Visit Summary has been reviewed and is understood.  Full responsibility of the confidentiality of this discharge information lies with you and/or your care-partner.

## 2021-06-17 NOTE — Progress Notes (Signed)
GASTROENTEROLOGY PROCEDURE H&P NOTE   Primary Care Physician: Gaynelle Cage, MD    Reason for Procedure:   Dysphagia, GERD  Plan:    EGD  Patient is appropriate for endoscopic procedure(s) in the ambulatory ([place]) setting.  The nature of the procedure, as well as the risks, benefits, and alternatives were carefully and thoroughly reviewed with the patient. Ample time for discussion and questions allowed. The patient understood, was satisfied, and agreed to proceed.     HPI: Tami Stone is a 71 y.o. female who presents for EGD for evaluation of dysphagia.  Patient was most recently seen in the Gastroenterology Clinic on 06/15/2021.  No interval change in medical history since that appointment. Please refer to that note for full details regarding GI history and clinical presentation.   Past Medical History:  Diagnosis Date   Diabetes ([place])    Does not check bs at home   Dyspnea    High cholesterol    Lung cancer ([place])    04/2020    Past Surgical History:  Procedure Laterality Date   ABDOMINAL HYSTERECTOMY     BREAST BIOPSY Right    BRONCHIAL BIOPSY  04/30/2020   Procedure: BRONCHIAL BIOPSIES;  Surgeon: Collene Gobble, MD;  Location: [facility];  Service: Cardiopulmonary;;   BRONCHIAL BRUSHINGS  04/30/2020   Procedure: BRONCHIAL BRUSHINGS;  Surgeon: Collene Gobble, MD;  Location: [facility];  Service: Cardiopulmonary;;   BRONCHIAL BRUSHINGS  01/20/2021   Procedure: BRONCHIAL BRUSHINGS;  Surgeon: Laurin Coder, MD;  Location: [facility];  Service: Endoscopy;;   BRONCHIAL WASHINGS  01/20/2021   Procedure: BRONCHIAL WASHINGS;  Surgeon: Laurin Coder, MD;  Location: [facility];  Service: Endoscopy;;   CHEST TUBE INSERTION Left 05/28/2021   Procedure: INSERTION PLEURAL DRAINAGE CATHETER;  Surgeon: [name], DO;  Location: [facility];  Service: Pulmonary;  Laterality: Left;  Prior to bronchoscopy   HEMOSTASIS CONTROL  04/30/2020   Procedure:  HEMOSTASIS CONTROL;  Surgeon: Collene Gobble, MD;  Location: [place];  Service: Cardiopulmonary;;   IR THORACENTESIS ASP PLEURAL SPACE W/IMG GUIDE  04/15/2021   IR THORACENTESIS ASP PLEURAL SPACE W/IMG GUIDE  05/03/2021   IR THORACENTESIS ASP PLEURAL SPACE W/IMG GUIDE  05/11/2021   TUBAL LIGATION     VIDEO BRONCHOSCOPY N/A 04/30/2020   Procedure: VIDEO BRONCHOSCOPY WITH FLUORO;  Surgeon: Collene Gobble, MD;  Location: [facility];  Service: Cardiopulmonary;  Laterality: N/A;   VIDEO BRONCHOSCOPY N/A 01/20/2021   Procedure: VIDEO BRONCHOSCOPY WITHOUT FLUORO;  Surgeon: Laurin Coder, MD;  Location: [facility];  Service: Endoscopy;  Laterality: N/A;   VIDEO BRONCHOSCOPY Left 05/28/2021   Procedure: VIDEO BRONCHOSCOPY WITHOUT FLUORO;  Surgeon: [name], DO;  Location: [facility];  Service: Pulmonary;  Laterality: Left;    Prior to Admission medications   Medication Sig Start Date End Date Taking? Authorizing Provider  AMBULATORY NON FORMULARY MEDICATION 2 capsules 2 (two) times daily. Medication Name: [place] tell mushroom supplement   Yes [provider]  Coenzyme Q10 (CO Q 10 PO) Take 300 mg by mouth at bedtime.   Yes [provider]  dabrafenib mesylate (TAFINLAR) 75 MG capsule Take 2 capsules (150 mg total) by mouth 2 (two) times daily. Take on an empty stomach 1 hour before or 2 hours after meals. 04/23/21  Yes Volanda Napoleon, MD  DANDELION PO Take 2,250 mg by mouth daily with supper.   Yes [provider]  ibuprofen (ADVIL) 200 MG tablet  Take 400 mg by mouth every 6 (six) hours as needed for mild pain.   Yes [provider]  IODINE, KELP, PO Take 1 capsule by mouth in the morning.   Yes [provider]  IVERMECTIN PO Take 2 mLs by mouth in the morning. Ivermectin oil - 10 mg/ml   Yes [provider]  letrozole (FEMARA) 2.5 MG tablet Take 1 tablet (2.5 mg total) by mouth daily. 04/28/21  Yes Volanda Napoleon, MD   metFORMIN (GLUCOPHAGE) 500 MG tablet Take 1 tablet (500 mg total) by mouth 3 (three) times daily. 03/03/21  Yes Ennever, Rudell Cobb, MD  Misc Natural Products [facility] MUSHROOM MIX) CAPS Take 1 tablet by mouth daily.   Yes [provider]  NP THYROID 60 MG tablet Take 60 mg by mouth daily. 02/24/20  Yes [provider]  Prenatal Vit-Fe Fumarate-FA (MULTIVITAMIN-PRENATAL) 27-0.8 MG TABS tablet Take 1 tablet by mouth daily at 12 noon.   Yes [provider]  trametinib dimethyl sulfoxide (MEKINIST) 2 MG tablet Take 1 tablet (2 mg total) by mouth daily. Take 1 hour before or 2 hours after a meal. Store refrigerated in original container. 06/02/20  Yes Curt Bears, MD  Zinc 100 MG TABS Take 100 mg by mouth daily.   Yes [provider]  Black Pepper-Turmeric (TURMERIC PLUS BLACK PEPPER EXT PO) Take 1 capsule by mouth in the morning, at noon, and at bedtime.    [provider]  Diindolylmethane POWD Take 200 mg by mouth every evening. DIM 200 mg    [provider]  Flavoring Agent (APRICOT FLAVOR) POWD Take 2 capsules by mouth daily. Apricot Seed Supplement    [provider]  OVER THE COUNTER MEDICATION Take 2 capsules by mouth 3 (three) times daily. Protectival    [provider]  polyethylene glycol (MIRALAX / GLYCOLAX) 17 g packet Take 17 g by mouth daily. Patient taking differently: Take 17 g by mouth daily as needed for moderate constipation. 01/23/21   Georgette Shell, MD    Current Outpatient Medications  Medication Sig Dispense Refill   AMBULATORY NON FORMULARY MEDICATION 2 capsules 2 (two) times daily. Medication Name: [place] tell mushroom supplement     Coenzyme Q10 (CO Q 10 PO) Take 300 mg by mouth at bedtime.     dabrafenib mesylate (TAFINLAR) 75 MG capsule Take 2 capsules (150 mg total) by mouth 2 (two) times daily. Take on an empty stomach 1 hour before or 2 hours after meals. 120 capsule 3   DANDELION PO Take  2,250 mg by mouth daily with supper.     ibuprofen (ADVIL) 200 MG tablet Take 400 mg by mouth every 6 (six) hours as needed for mild pain.     IODINE, KELP, PO Take 1 capsule by mouth in the morning.     IVERMECTIN PO Take 2 mLs by mouth in the morning. Ivermectin oil - 10 mg/ml     letrozole (FEMARA) 2.5 MG tablet Take 1 tablet (2.5 mg total) by mouth daily. 30 tablet 2   metFORMIN (GLUCOPHAGE) 500 MG tablet Take 1 tablet (500 mg total) by mouth 3 (three) times daily. 90 tablet 5   Misc Natural Products (MAGIC MUSHROOM MIX) CAPS Take 1 tablet by mouth daily.     NP THYROID 60 MG tablet Take 60 mg by mouth daily.     Prenatal Vit-Fe Fumarate-FA (MULTIVITAMIN-PRENATAL) 27-0.8 MG TABS tablet Take 1 tablet by mouth daily at 12 noon.  trametinib dimethyl sulfoxide (MEKINIST) 2 MG tablet Take 1 tablet (2 mg total) by mouth daily. Take 1 hour before or 2 hours after a meal. Store refrigerated in original container. 30 tablet 3   Zinc 100 MG TABS Take 100 mg by mouth daily.     Black Pepper-Turmeric (TURMERIC PLUS BLACK PEPPER EXT PO) Take 1 capsule by mouth in the morning, at noon, and at bedtime.     Diindolylmethane POWD Take 200 mg by mouth every evening. DIM 200 mg     Flavoring Agent (APRICOT FLAVOR) POWD Take 2 capsules by mouth daily. Apricot Seed Supplement     OVER THE COUNTER MEDICATION Take 2 capsules by mouth 3 (three) times daily. Protectival     polyethylene glycol (MIRALAX / GLYCOLAX) 17 g packet Take 17 g by mouth daily. (Patient taking differently: Take 17 g by mouth daily as needed for moderate constipation.) 14 each 0   Current Facility-Administered Medications  Medication Dose Route Frequency Provider Last Rate Last Admin   0.9 %  sodium chloride infusion  500 mL Intravenous Once Arda Keadle V, DO        Allergies as of 06/17/2021   (No Known Allergies)    Family History  Problem Relation Age of Onset   Breast cancer Sister        half sister on maternal side    Colon cancer Neg Hx    Esophageal cancer Neg Hx     Social History   Socioeconomic History   Marital status: Married    Spouse name: Not on file   Number of children: Not on file   Years of education: Not on file   Highest education level: Not on file  Occupational History   Not on file  Tobacco Use   Smoking status: Never   Smokeless tobacco: Never  Vaping Use   Vaping Use: Never used  Substance and Sexual Activity   Alcohol use: Not Currently   Drug use: Never   Sexual activity: Not on file  Other Topics Concern   Not on file  Social History Narrative   Not on file   Social Determinants of Health   Financial Resource Strain: Not on file  Food Insecurity: Not on file  Transportation Needs: Not on file  Physical Activity: Not on file  Stress: Not on file  Social Connections: Not on file  Intimate Partner Violence: Not on file    Physical Exam: Vital signs in last 24 hours: @BP  123/75    Pulse 98    Temp (!) 97.1 F (36.2 C) (Temporal)    Ht 5\' 2"  (1.575 m)    Wt 116 lb (52.6 kg)    SpO2 99%    BMI 21.22 kg/m  GEN: NAD EYE: Sclerae anicteric ENT: MMM CV: Non-tachycardic Pulm: CTA b/l GI: Soft, NT/ND NEURO:  Alert & Oriented x 3   Gerrit Heck, DO [facility]   06/17/2021 3:24 PM

## 2021-06-17 NOTE — Progress Notes (Signed)
1536 Robinul 0.1 mg IV given due large amount of secretions upon assessment.  MD made aware, vss

## 2021-06-18 ENCOUNTER — Encounter: Payer: Self-pay | Admitting: *Deleted

## 2021-06-18 ENCOUNTER — Inpatient Hospital Stay: Payer: Medicare Other

## 2021-06-18 NOTE — Progress Notes (Signed)
Patient called and cancelled appointments stating she was not ready to begin treatment. Appointments cancelled. Chemo educator notified. She will try to complete education over the phone. Voicemail has been left for patient without return call.   Oncology Nurse Navigator Documentation  Oncology Nurse Navigator Flowsheets 06/18/2021  Abnormal Finding Date -  Confirmed Diagnosis Date -  Diagnosis Status -  Navigator Follow Up Date: -  Navigator Follow Up Reason: -  Navigation Complete Date: -  Post Navigation: Continue to Follow Patient? 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

## 2021-06-21 ENCOUNTER — Telehealth: Payer: Self-pay

## 2021-06-21 ENCOUNTER — Encounter: Payer: Self-pay | Admitting: *Deleted

## 2021-06-21 NOTE — Progress Notes (Signed)
Patient in chemotherapy education class on 06/18/2021 with  self done via phone conversation.  Discussed side effects of      Opdivo and Yervoy which include but are not limited to myelosuppression, decreased appetite, fatigue, fever, allergic or infusional reaction, cough, SOB, nausea and vomiting, diarrhea, elevated LFTs myalgia and arthralgias,  rash, skin dryness,   mental changes (Chemo brain), increased risk of infections, weight loss.  Reviewed infusion room and office policy and procedure and phone numbers 24 hours x 7 days a week.   Reviewed when to call the office with any concerns or problems.  Scientist, clinical (histocompatibility and immunogenetics) given.  Discussed portacath insertion and EMLA cream administration.  Antiemetic protocol and chemotherapy schedule reviewed. Patient verbalized understanding of chemotherapy indications and possible side effects.  Teachback done

## 2021-06-21 NOTE — Telephone Encounter (Signed)
°  Follow up Call-  Call back number 06/17/2021  Post procedure Call Back phone  # [phone]  Permission to leave phone message Yes  Some recent data might be hidden     Patient questions:  Do you have a fever, pain , or abdominal swelling? No. Pain Score  0 *  Have you tolerated food without any problems? Yes.    Have you been able to return to your normal activities? Yes.    Do you have any questions about your discharge instructions: Diet   No. Medications  No. Follow up visit  No.  Do you have questions or concerns about your Care? No.  Actions: * If pain score is 4 or above: No action needed, pain <4.  Have you developed a fever since your procedure? no  2.   Have you had an respiratory symptoms (SOB or cough) since your procedure? no  3.   Have you tested positive for COVID 19 since your procedure no  4.   Have you had any family members/close contacts diagnosed with the COVID 19 since your procedure?  no   If yes to any of these questions please route to Joylene John, RN and Joella Prince, RN

## 2021-06-23 ENCOUNTER — Emergency Department (HOSPITAL_COMMUNITY): Payer: Medicare Other

## 2021-06-23 ENCOUNTER — Inpatient Hospital Stay (HOSPITAL_COMMUNITY)
Admission: EM | Admit: 2021-06-23 | Discharge: 2021-06-26 | DRG: 054 | Disposition: A | Discharge status: Home / Self Care | Payer: Medicare Other | Attending: Internal Medicine | Admitting: Internal Medicine

## 2021-06-23 ENCOUNTER — Other Ambulatory Visit: Payer: Self-pay

## 2021-06-23 ENCOUNTER — Encounter (HOSPITAL_COMMUNITY): Payer: Self-pay | Admitting: *Deleted

## 2021-06-23 ENCOUNTER — Telehealth: Payer: Self-pay | Admitting: *Deleted

## 2021-06-23 DIAGNOSIS — C7931 Secondary malignant neoplasm of brain: Principal | ICD-10-CM | POA: Diagnosis present

## 2021-06-23 DIAGNOSIS — E7800 Pure hypercholesterolemia, unspecified: Secondary | ICD-10-CM | POA: Diagnosis present

## 2021-06-23 DIAGNOSIS — R569 Unspecified convulsions: Secondary | ICD-10-CM | POA: Diagnosis present

## 2021-06-23 DIAGNOSIS — R531 Weakness: Secondary | ICD-10-CM

## 2021-06-23 DIAGNOSIS — Z79890 Hormone replacement therapy (postmenopausal): Secondary | ICD-10-CM

## 2021-06-23 DIAGNOSIS — K5900 Constipation, unspecified: Secondary | ICD-10-CM | POA: Diagnosis present

## 2021-06-23 DIAGNOSIS — D72828 Other elevated white blood cell count: Secondary | ICD-10-CM | POA: Diagnosis present

## 2021-06-23 DIAGNOSIS — G936 Cerebral edema: Secondary | ICD-10-CM | POA: Diagnosis present

## 2021-06-23 DIAGNOSIS — Z79899 Other long term (current) drug therapy: Secondary | ICD-10-CM | POA: Diagnosis not present

## 2021-06-23 DIAGNOSIS — J90 Pleural effusion, not elsewhere classified: Secondary | ICD-10-CM | POA: Diagnosis not present

## 2021-06-23 DIAGNOSIS — Z66 Do not resuscitate: Secondary | ICD-10-CM | POA: Diagnosis present

## 2021-06-23 DIAGNOSIS — J910 Malignant pleural effusion: Secondary | ICD-10-CM | POA: Diagnosis present

## 2021-06-23 DIAGNOSIS — D630 Anemia in neoplastic disease: Secondary | ICD-10-CM | POA: Diagnosis present

## 2021-06-23 DIAGNOSIS — Z923 Personal history of irradiation: Secondary | ICD-10-CM | POA: Diagnosis not present

## 2021-06-23 DIAGNOSIS — E43 Unspecified severe protein-calorie malnutrition: Secondary | ICD-10-CM | POA: Diagnosis present

## 2021-06-23 DIAGNOSIS — C50512 Malignant neoplasm of lower-outer quadrant of left female breast: Secondary | ICD-10-CM | POA: Diagnosis present

## 2021-06-23 DIAGNOSIS — J948 Other specified pleural conditions: Secondary | ICD-10-CM | POA: Diagnosis present

## 2021-06-23 DIAGNOSIS — K222 Esophageal obstruction: Secondary | ICD-10-CM | POA: Diagnosis present

## 2021-06-23 DIAGNOSIS — Z170 Estrogen receptor positive status [ER+]: Secondary | ICD-10-CM

## 2021-06-23 DIAGNOSIS — C782 Secondary malignant neoplasm of pleura: Secondary | ICD-10-CM | POA: Diagnosis present

## 2021-06-23 DIAGNOSIS — R42 Dizziness and giddiness: Secondary | ICD-10-CM | POA: Diagnosis not present

## 2021-06-23 DIAGNOSIS — E1140 Type 2 diabetes mellitus with diabetic neuropathy, unspecified: Secondary | ICD-10-CM | POA: Diagnosis present

## 2021-06-23 DIAGNOSIS — Z803 Family history of malignant neoplasm of breast: Secondary | ICD-10-CM | POA: Diagnosis not present

## 2021-06-23 DIAGNOSIS — Z79811 Long term (current) use of aromatase inhibitors: Secondary | ICD-10-CM

## 2021-06-23 DIAGNOSIS — E039 Hypothyroidism, unspecified: Secondary | ICD-10-CM | POA: Diagnosis present

## 2021-06-23 DIAGNOSIS — Z6820 Body mass index (BMI) 20.0-20.9, adult: Secondary | ICD-10-CM

## 2021-06-23 DIAGNOSIS — E871 Hypo-osmolality and hyponatremia: Secondary | ICD-10-CM | POA: Diagnosis not present

## 2021-06-23 DIAGNOSIS — Z20822 Contact with and (suspected) exposure to covid-19: Secondary | ICD-10-CM | POA: Diagnosis present

## 2021-06-23 DIAGNOSIS — E222 Syndrome of inappropriate secretion of antidiuretic hormone: Secondary | ICD-10-CM | POA: Diagnosis present

## 2021-06-23 DIAGNOSIS — Z90710 Acquired absence of both cervix and uterus: Secondary | ICD-10-CM

## 2021-06-23 DIAGNOSIS — C7951 Secondary malignant neoplasm of bone: Secondary | ICD-10-CM | POA: Diagnosis present

## 2021-06-23 DIAGNOSIS — D75839 Thrombocytosis, unspecified: Secondary | ICD-10-CM | POA: Diagnosis not present

## 2021-06-23 DIAGNOSIS — Z7984 Long term (current) use of oral hypoglycemic drugs: Secondary | ICD-10-CM | POA: Diagnosis not present

## 2021-06-23 DIAGNOSIS — Z9689 Presence of other specified functional implants: Secondary | ICD-10-CM | POA: Diagnosis not present

## 2021-06-23 DIAGNOSIS — C3492 Malignant neoplasm of unspecified part of left bronchus or lung: Secondary | ICD-10-CM | POA: Diagnosis present

## 2021-06-23 DIAGNOSIS — D638 Anemia in other chronic diseases classified elsewhere: Secondary | ICD-10-CM | POA: Diagnosis not present

## 2021-06-23 LAB — CBC WITH DIFFERENTIAL/PLATELET
Abs Immature Granulocytes: 0.04 10*3/uL (ref 0.00–0.07)
Basophils Absolute: 0.1 10*3/uL (ref 0.0–0.1)
Basophils Relative: 1 %
Eosinophils Absolute: 0.8 10*3/uL — ABNORMAL HIGH (ref 0.0–0.5)
Eosinophils Relative: 6 %
HCT: 28.8 % — ABNORMAL LOW (ref 36.0–46.0)
Hemoglobin: 8.8 g/dL — ABNORMAL LOW (ref 12.0–15.0)
Immature Granulocytes: 0 %
Lymphocytes Relative: 18 %
Lymphs Abs: 2.4 10*3/uL (ref 0.7–4.0)
MCH: 25.7 pg — ABNORMAL LOW (ref 26.0–34.0)
MCHC: 30.6 g/dL (ref 30.0–36.0)
MCV: 84 fL (ref 80.0–100.0)
Monocytes Absolute: 1.2 10*3/uL — ABNORMAL HIGH (ref 0.1–1.0)
Monocytes Relative: 9 %
Neutro Abs: 8.8 10*3/uL — ABNORMAL HIGH (ref 1.7–7.7)
Neutrophils Relative %: 66 %
Platelets: 759 10*3/uL — ABNORMAL HIGH (ref 150–400)
RBC: 3.43 MIL/uL — ABNORMAL LOW (ref 3.87–5.11)
RDW: 17.5 % — ABNORMAL HIGH (ref 11.5–15.5)
WBC: 13.3 10*3/uL — ABNORMAL HIGH (ref 4.0–10.5)
nRBC: 0 % (ref 0.0–0.2)

## 2021-06-23 LAB — RESP PANEL BY RT-PCR (FLU A&B, COVID) ARPGX2
Influenza A by PCR: NEGATIVE
Influenza B by PCR: NEGATIVE
SARS Coronavirus 2 by RT PCR: NEGATIVE

## 2021-06-23 LAB — COMPREHENSIVE METABOLIC PANEL
ALT: 10 U/L (ref 0–44)
AST: 13 U/L — ABNORMAL LOW (ref 15–41)
Albumin: 2.6 g/dL — ABNORMAL LOW (ref 3.5–5.0)
Alkaline Phosphatase: 106 U/L (ref 38–126)
Anion gap: 13 (ref 5–15)
BUN: 14 mg/dL (ref 8–23)
CO2: 25 mmol/L (ref 22–32)
Calcium: 8.6 mg/dL — ABNORMAL LOW (ref 8.9–10.3)
Chloride: 92 mmol/L — ABNORMAL LOW (ref 98–111)
Creatinine, Ser: 0.61 mg/dL (ref 0.44–1.00)
GFR, Estimated: >60 mL/min (ref >60)
Glucose, Bld: 113 mg/dL — ABNORMAL HIGH (ref 70–99)
Potassium: 4.1 mmol/L (ref 3.5–5.1)
Sodium: 130 mmol/L — ABNORMAL LOW (ref 135–145)
Total Bilirubin: 0.5 mg/dL (ref 0.3–1.2)
Total Protein: 7.1 g/dL (ref 6.5–8.1)

## 2021-06-23 LAB — TROPONIN I (HIGH SENSITIVITY): Troponin I (High Sensitivity): 3 ng/L (ref <18)

## 2021-06-23 IMAGING — MR MR HEAD WO/W CM
15 series · 48 of 48 positions shown · IV contrast (gadavist)
Comparison: Head CT from earlier the same day as well as previous
brain MRI from [DATE].

CLINICAL DATA: Initial evaluation for CNS neoplasm, assess
treatment response.

EXAM:
MRI HEAD WITHOUT AND WITH CONTRAST
TECHNIQUE: Multiplanar, multiecho pulse sequences of the brain and surrounding
structures were obtained without and with intravenous contrast.
CONTRAST:  5.5mL GADAVIST GADOBUTROL 1 MMOL/ML IV SOLN

[Series 5: DWI · axial · 3.0mm · 1.36mm/px · z∈[-26,+112]mm · 6 of 96 slices shown (1 of 2)]
[im 1/96]
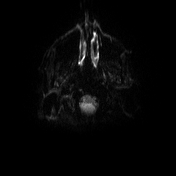
[im 20/96]
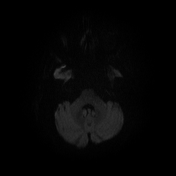
[im 39/96]
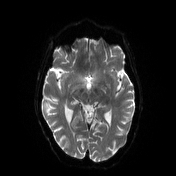
[im 58/96]
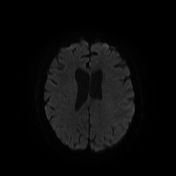
[im 77/96]
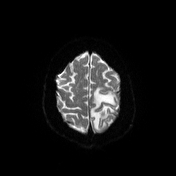
[im 96/96]
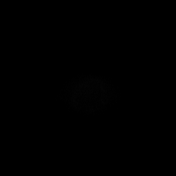

[Series 6: DWI · axial · 3.0mm · 1.36mm/px · z∈[-26,+112]mm · 3 of 48 slices shown (2 of 2)]
[im 1/48]
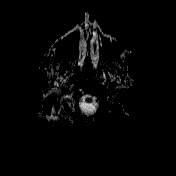
[im 24/48]
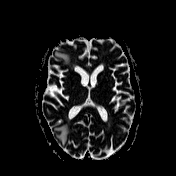
[im 48/48]
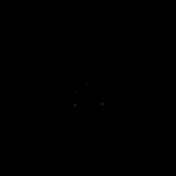

[Series 7: T1 · sagittal · 5.0mm · 0.75mm/px · 1 of 24 slices shown (1 of 4)]
[im 1/24]
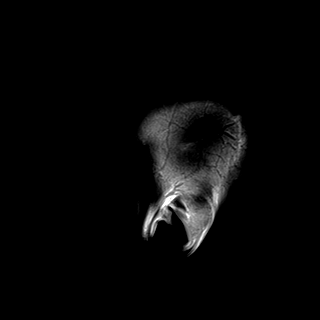

[Series 8: T2 · axial · 5.0mm · 0.62mm/px · z∈[-42,+117]mm · 2 of 26 slices shown]
[im 1/26]
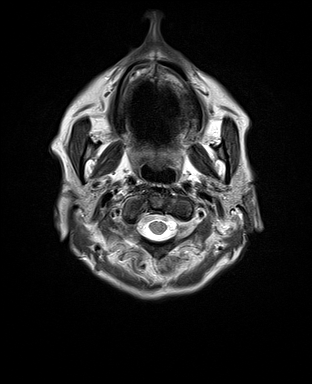
[im 26/26]
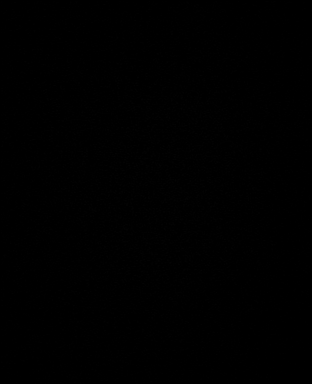

[Series 9: swi_images · axial · 3.0mm · 0.75mm/px · z∈[-44,+119]mm · 3 of 56 slices shown]
[im 1/56]
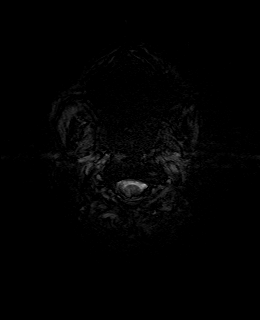
[im 28/56]
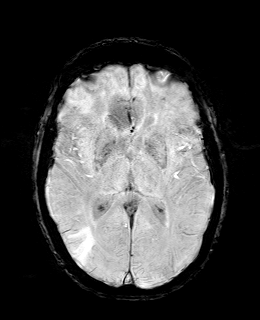
[im 56/56]
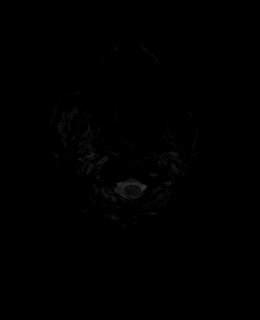

[Series 11: FLAIR · axial · 3.0mm · 0.75mm/px · z∈[-38,+113]mm · 3 of 52 slices shown]
[im 1/52]
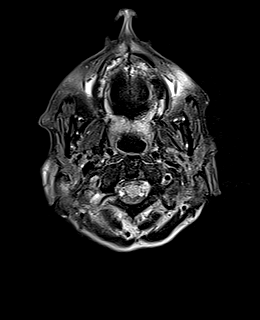
[im 26/52]
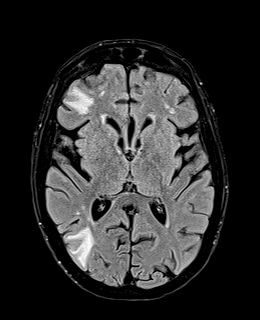
[im 52/52]
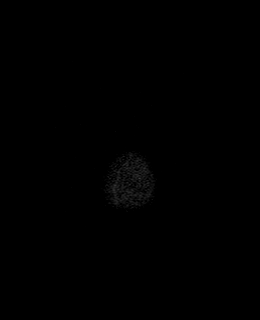

[Series 12: T1 · axial · 1.0mm · 0.94mm/px · z∈[-31,+109]mm · 8 of 144 slices shown (2 of 4)]
[im 1/144]
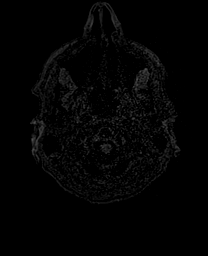
[im 21/144]
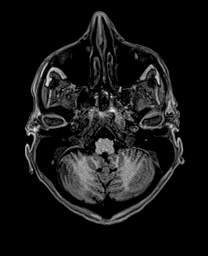
[im 41/144]
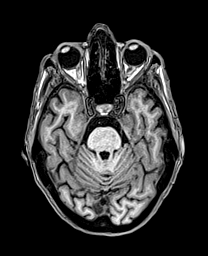
[im 62/144]
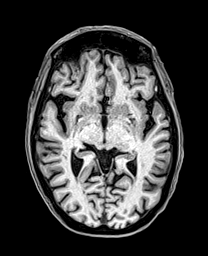
[im 82/144]
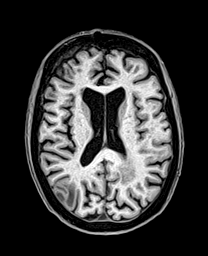
[im 103/144]
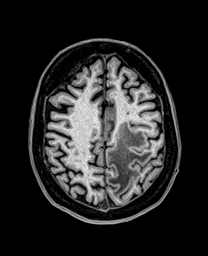
[im 123/144]
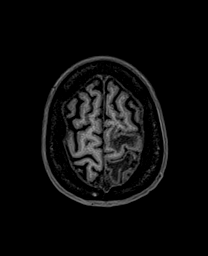
[im 144/144]
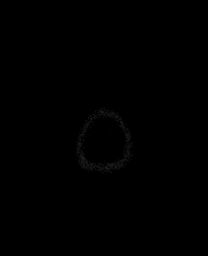

[Series 13: cor dwi_tracew · coronal · 5.0mm · 1.53mm/px · 3 of 56 slices shown]
[im 1/56]
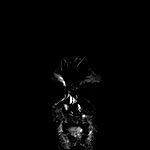
[im 28/56]
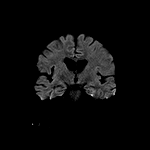
[im 56/56]
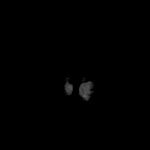

[Series 14: cor dwi_adc · coronal · 5.0mm · 1.53mm/px · 2 of 28 slices shown]
[im 1/28]
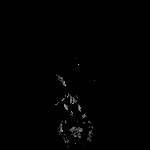
[im 28/28]
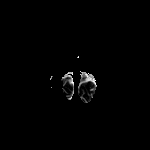

[Series 15: T2 post-contrast · coronal · 5.0mm · 0.57mm/px · 2 of 28 slices shown]
[im 1/28]
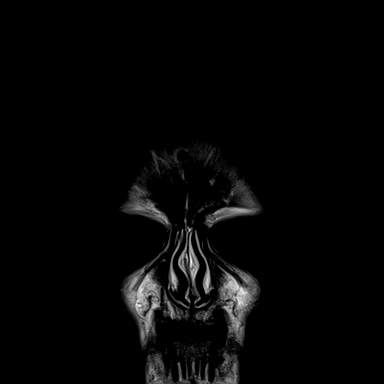
[im 28/28]
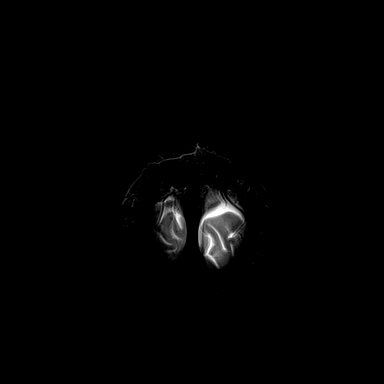

[Series 16: T1 post-contrast · axial · 1.0mm · 0.94mm/px · z∈[-31,+109]mm · 8 of 144 slices shown (1 of 3)]
[im 1/144]
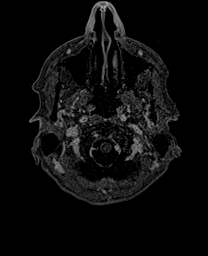
[im 21/144]
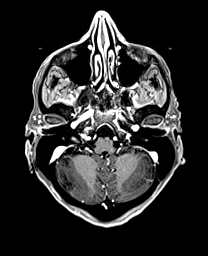
[im 41/144]
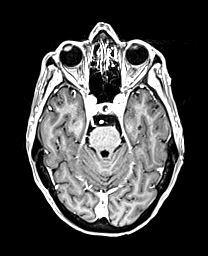
[im 62/144]
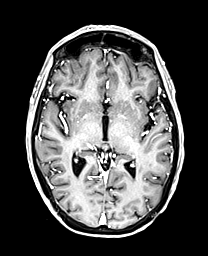
[im 82/144]
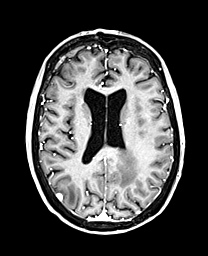
[im 103/144]
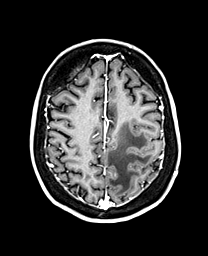
[im 123/144]
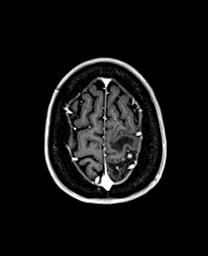
[im 144/144]
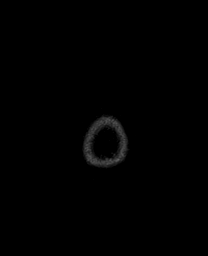

[Series 17: T1 · sagittal · 4.0mm · 0.94mm/px · 2 of 33 slices shown (3 of 4)]
[im 1/33]
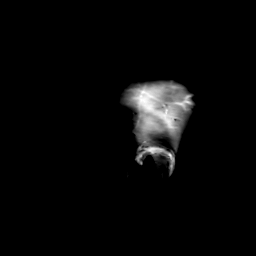
[im 33/33]
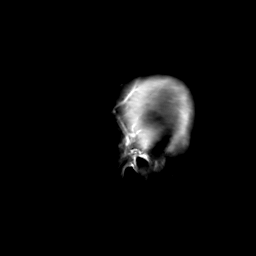

[Series 18: T1 · coronal · 4.0mm · 0.94mm/px · 2 of 42 slices shown (4 of 4)]
[im 1/42]
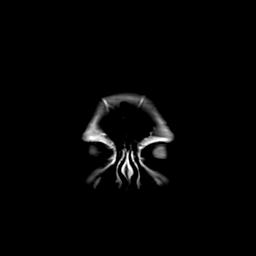
[im 42/42]
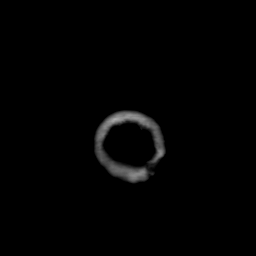

[Series 19: T1 post-contrast · coronal · 5.0mm · 0.43mm/px · 2 of 28 slices shown (2 of 3)]
[im 1/28]
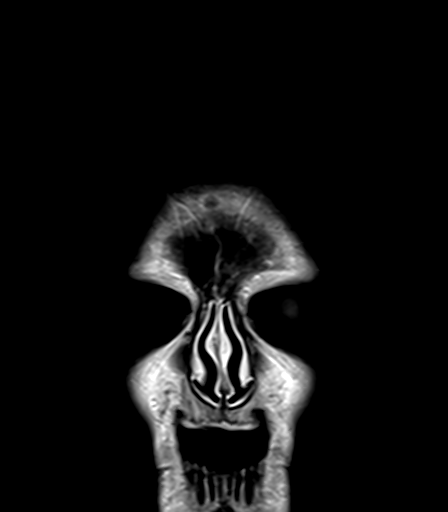
[im 28/28]
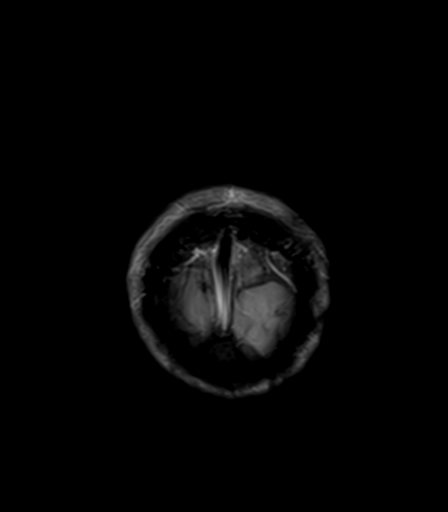

[Series 20: T1 post-contrast · sagittal · 5.0mm · 0.75mm/px · 1 of 24 slices shown (3 of 3)]
[im 1/24]
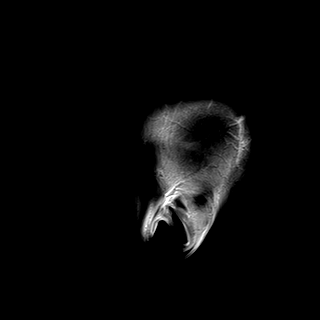

[48 of 48 positions shown; findings below may reference images not displayed]

FINDINGS: Brain: Cerebral volume stable, and remains within normal limits.
Underlying mild chronic microvascular ischemic disease noted, also
unchanged. No evidence for acute or subacute infarct. No acute or
chronic intracranial hemorrhage. No extra-axial collection.

No intracranial metastatic disease again seen. Overall, appearance
is slightly worsened and progressed from previous, with interval
enlargement of multiple lesions as compared to previous. For
example, a lesion positioned at the gray-white matter
differentiation of the left parietal region now measures up to 11
mm, previously 9 mm (series 16, image 116). A lesion slightly
anteriorly within the posterior left frontal region measures up to
10 mm, previously 7 mm (series 16, image 16). On the right, a lesion
at the anterior right frontal lobe now measures 6 mm, previously 5
mm (series 16, image 76). A dural based lesion at the right
occipital lobe measures 8 mm, previously 5 mm (series 16, image 78).
Additionally, several new punctate lesions are seen at the posterior
left frontotemporal region, not definitely seen on prior (series 16,
images 90, 81). Surrounding vasogenic edema seen about the larger
lesions, mildly progressed, and most pronounced at the left parietal
lobe. Mild mass effect on the adjacent left lateral ventricle with
trace left-to-right shift. No hydrocephalus or trapping. Basilar
cisterns remain patent.

Vascular: Major intracranial vascular flow voids are maintained.

Skull and upper cervical spine: Craniocervical junction within
normal limits. Decreased T1 signal intensity seen within the
visualized bone marrow without focal marrow replacing lesion. No
scalp soft tissue abnormality.

Sinuses/Orbits: Right gaze noted. Globes and orbital soft tissues
demonstrate no other acute finding. Paranasal sinuses are largely
clear. No mastoid effusion. Inner ear structures grossly normal.

Other: None.
IMPRESSION: 1. Interval enlargement of multiple metastatic lesions involving the
bilateral cerebral hemispheres, with a few new punctate lesions as
above, consistent with interval progression of disease. Associated
vasogenic edema about the larger lesions, also mildly progressed.
Associated trace left-to-right shift without hydrocephalus or
trapping.
2. No other acute intracranial abnormality.

## 2021-06-23 IMAGING — CT CT HEAD W/O CM
3 series · 15 of 47 positions shown, 18 images · non-contrast
Comparison: [DATE] and brain MR [DATE]

CLINICAL DATA: Weakness.  History of metastatic lung cancer.



[Series 2: head wo · axial · 0.47mm/px · z∈[-120,+5]mm · 9 of 30 slices shown, 12 images]
[im 3/30  brain]
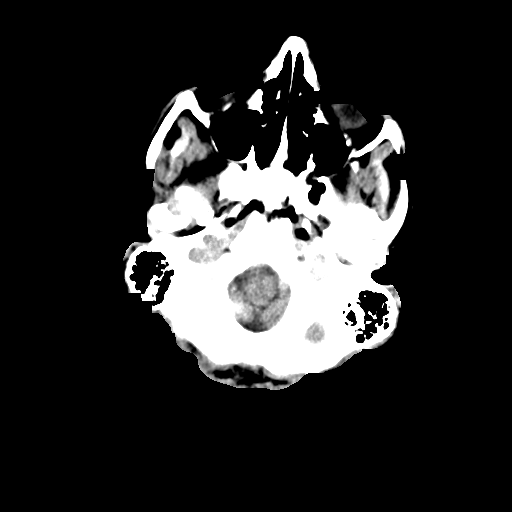
[im 3/30  bone]
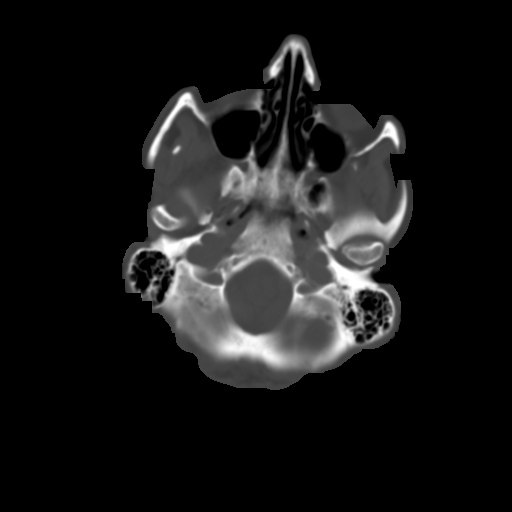
[im 6/30  brain]
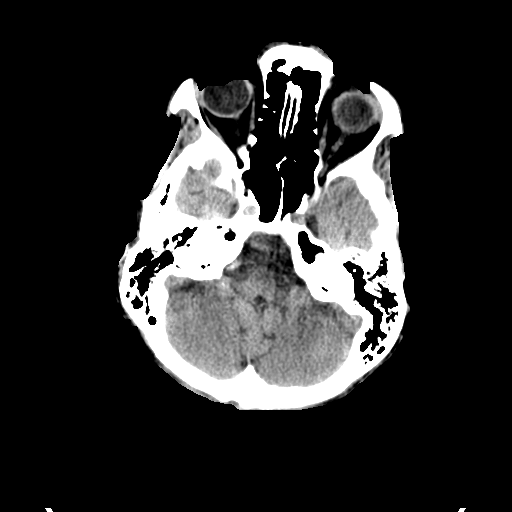
[im 9/30  brain]
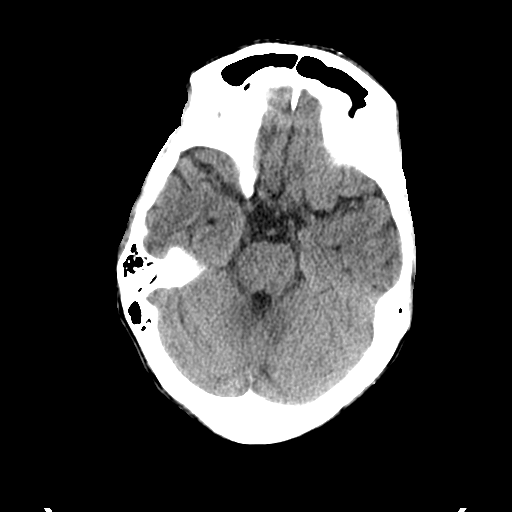
[im 12/30  brain]
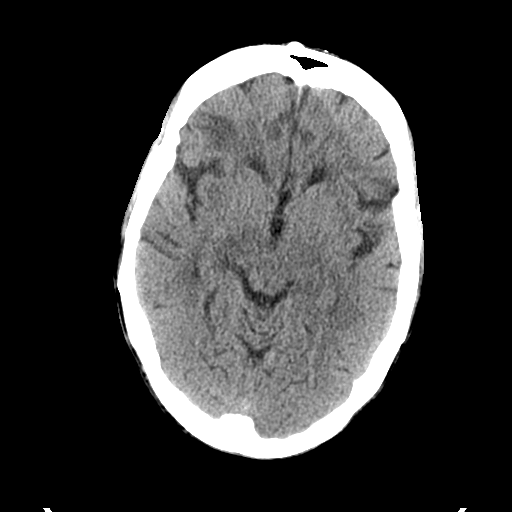
[im 16/30  brain]
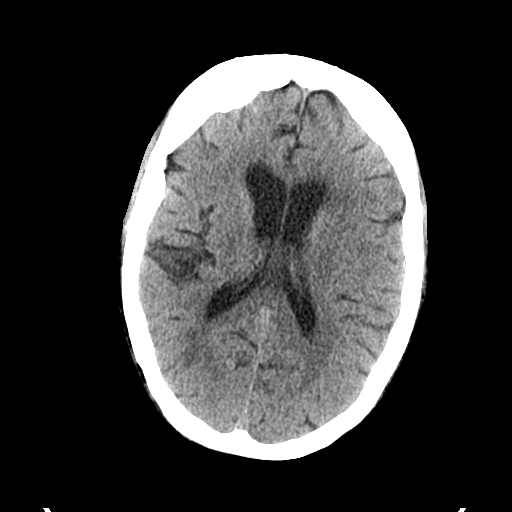
[im 16/30  bone]
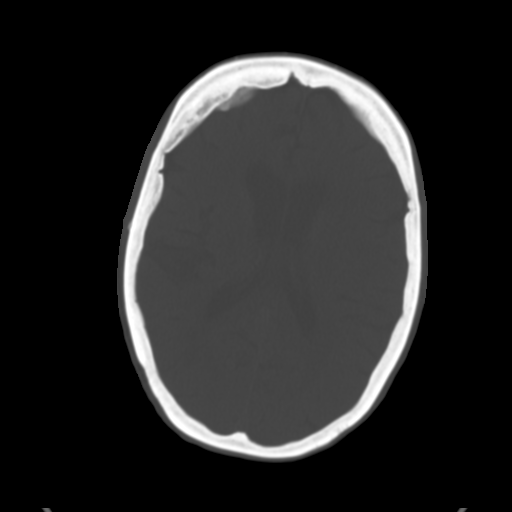
[im 19/30  brain]
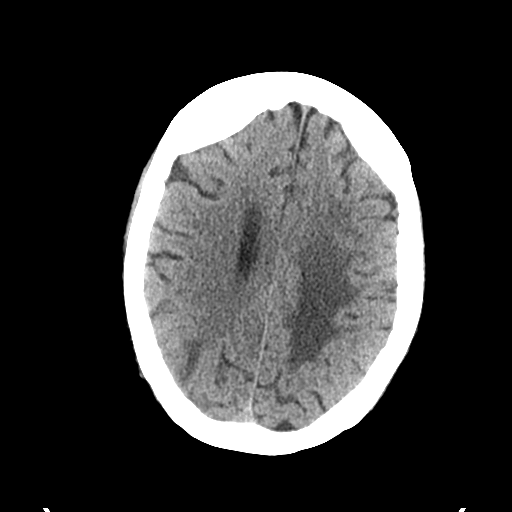
[im 22/30  brain]
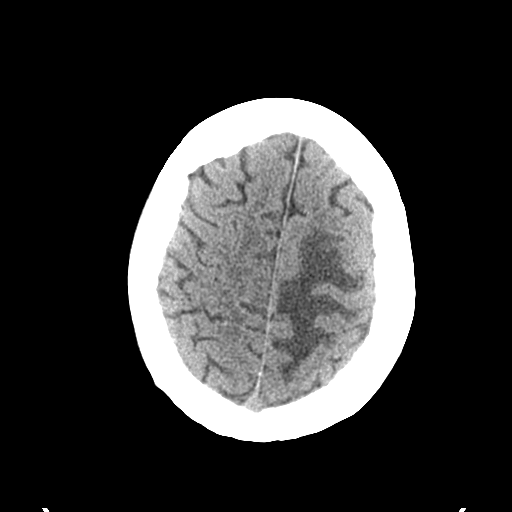
[im 25/30  brain]
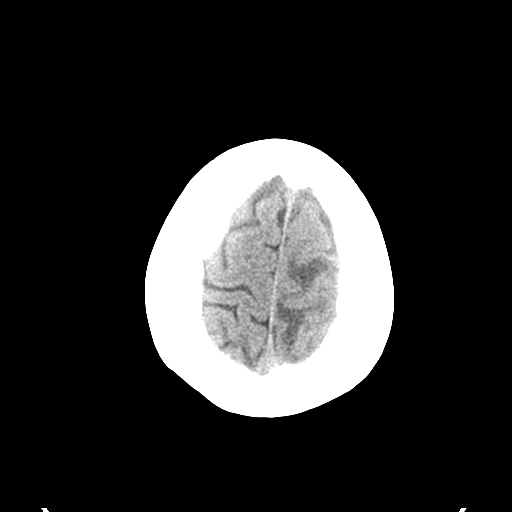
[im 28/30  brain]
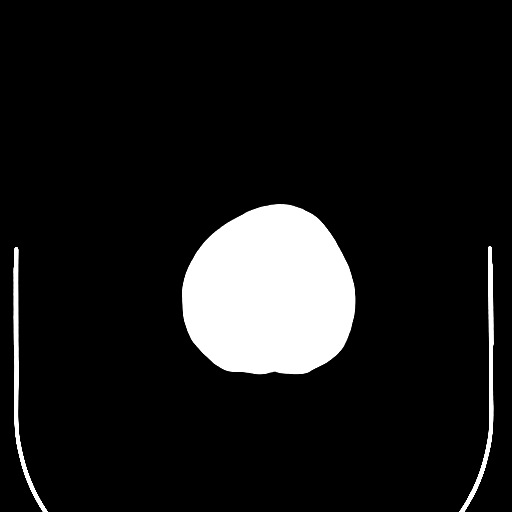
[im 28/30  bone]
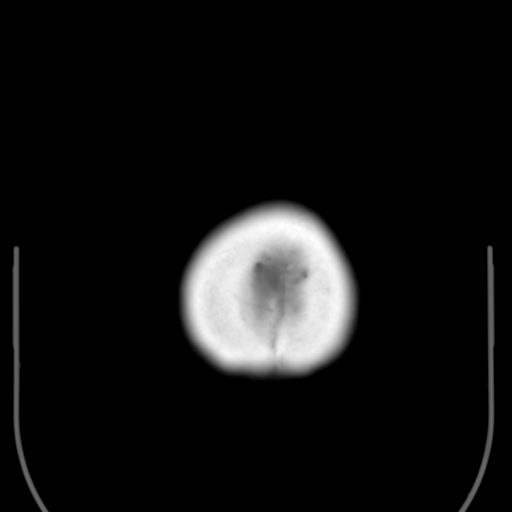

[Series 5: coronal soft tissue · coronal · 0.34mm/px · 3 of 73 slices shown]
[im 25/73  brain]
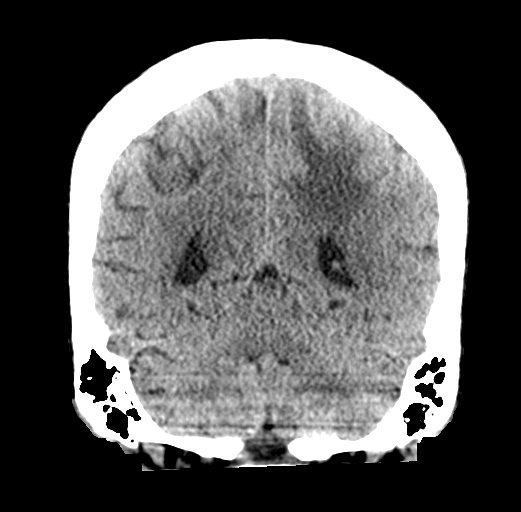
[im 33/73  brain]
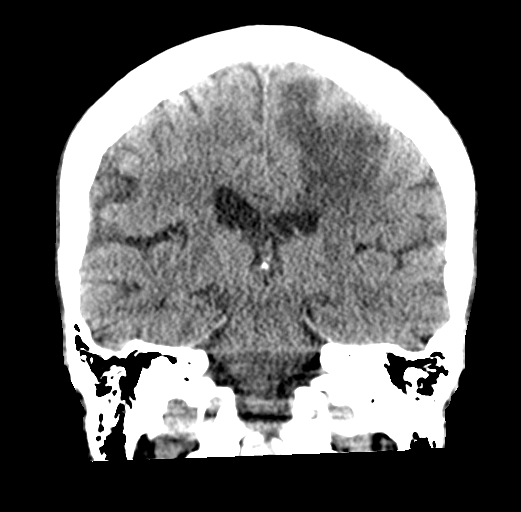
[im 41/73  brain]
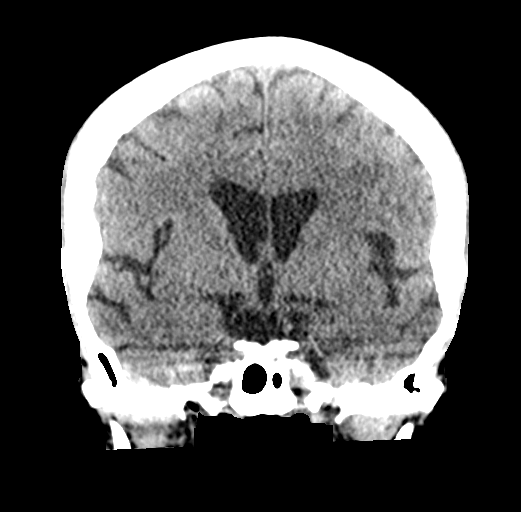

[Series 6: sagittal soft tissue · sagittal · 0.30mm/px · 3 of 57 slices shown]
[im 19/57  brain]
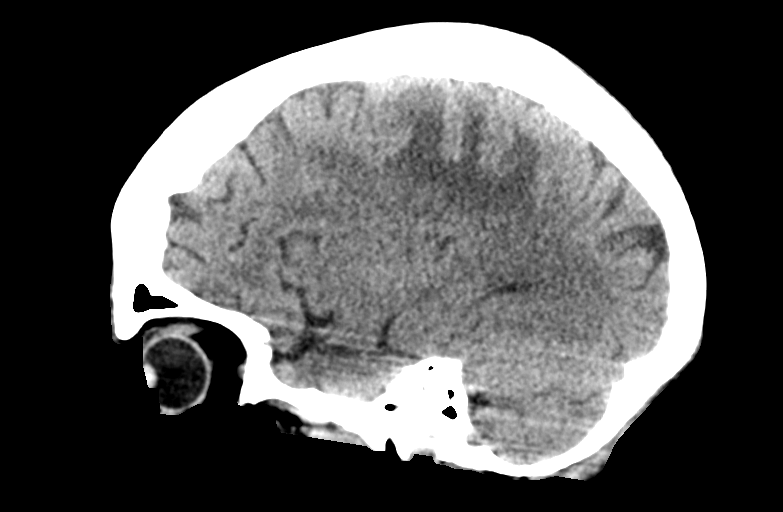
[im 29/57  brain]
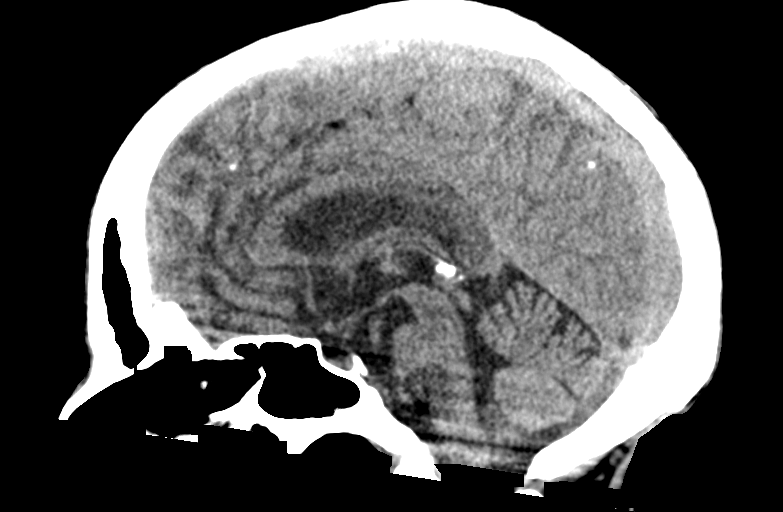
[im 38/57  brain]
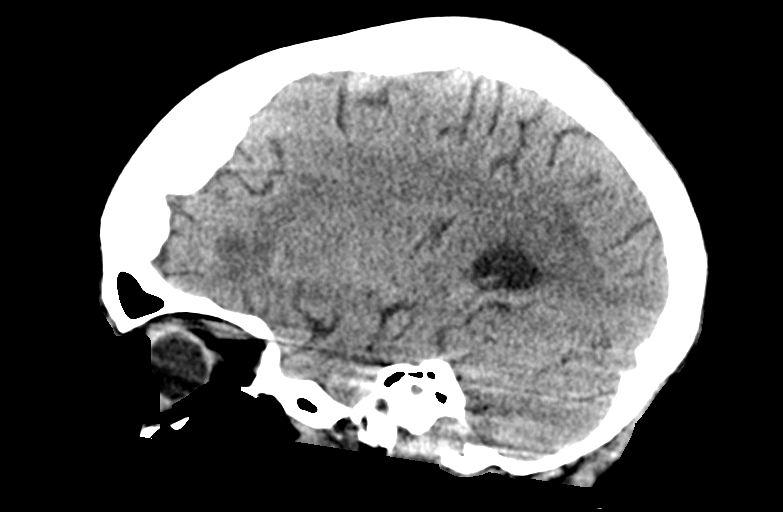

[15 of 47 positions shown; findings below may reference images not displayed]

FINDINGS: Brain: Again noted is vasogenic edema in the left parietal lobe and
this may have progressed since the MRI on [DATE]. There is new
vasogenic edema in the right parietooccipital region and concerning
for a new underlying lesion. There is also new low-density in the
right frontal lobe on series 2, image 12. No evidence for acute
hemorrhage. Ventricles are stable and there is no significant
midline shift.

Vascular: No hyperdense vessel or unexpected calcification.

Skull: Normal. Negative for fracture or focal lesion.

Sinuses/Orbits: Visualized sinuses are clear.

Other: None
IMPRESSION: 1. Patient has known intracranial metastatic disease and there are
new areas of vasogenic edema in the right frontal lobe and right
parietal-occipital region. In addition, the edema in the left
parietal lobe may have increased. Findings are suggestive for
progression of intracranial metastatic disease. This could be better
evaluated with MRI.
2. No evidence for acute hemorrhage.

## 2021-06-23 IMAGING — CR DG CHEST 2V
2 series · 2 of 2 positions shown · non-contrast
Comparison: Chest x-ray dated [DATE].

CLINICAL DATA: New onset leg weakness and difficulty walking.

EXAM:
CHEST - 2 VIEW

[w chest lat]
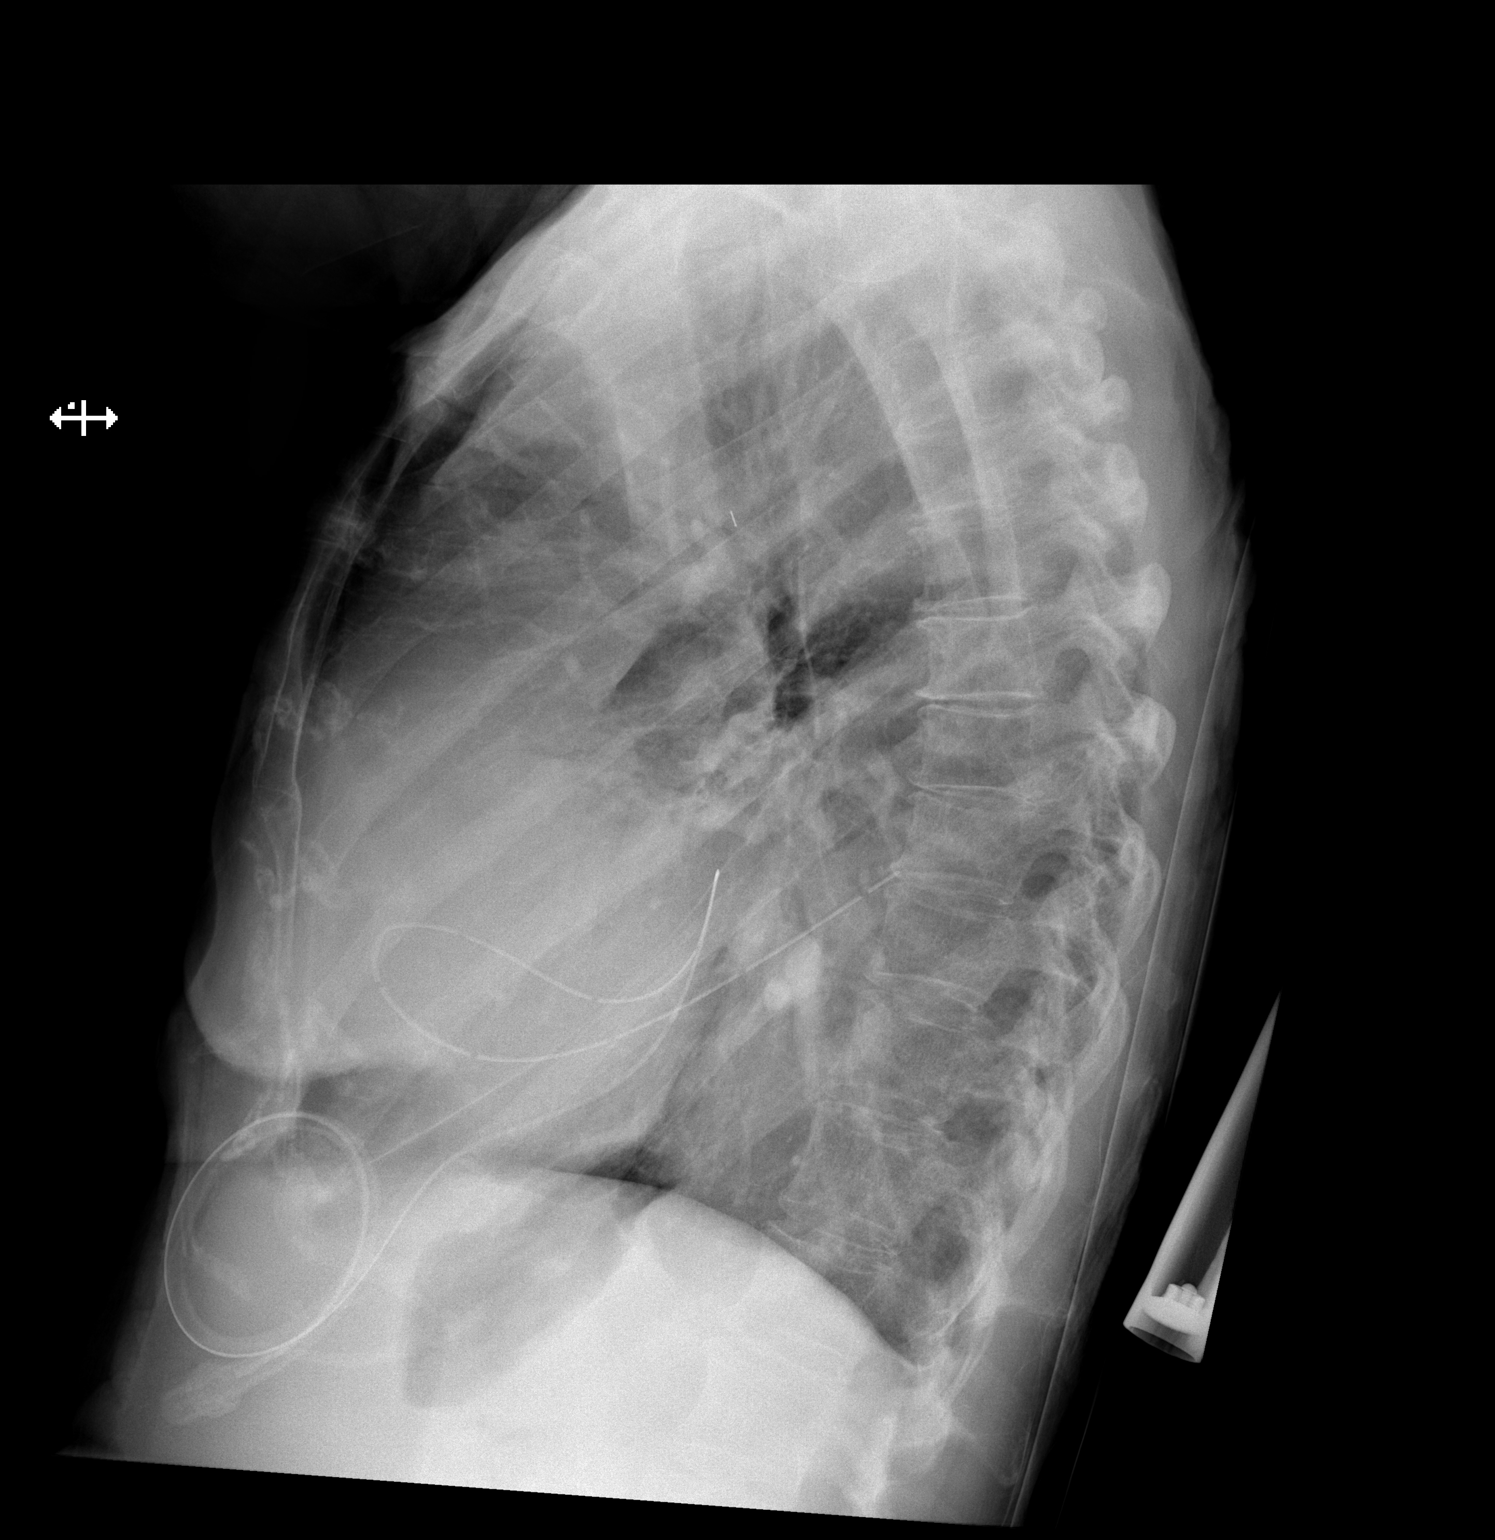

[x chest ap]
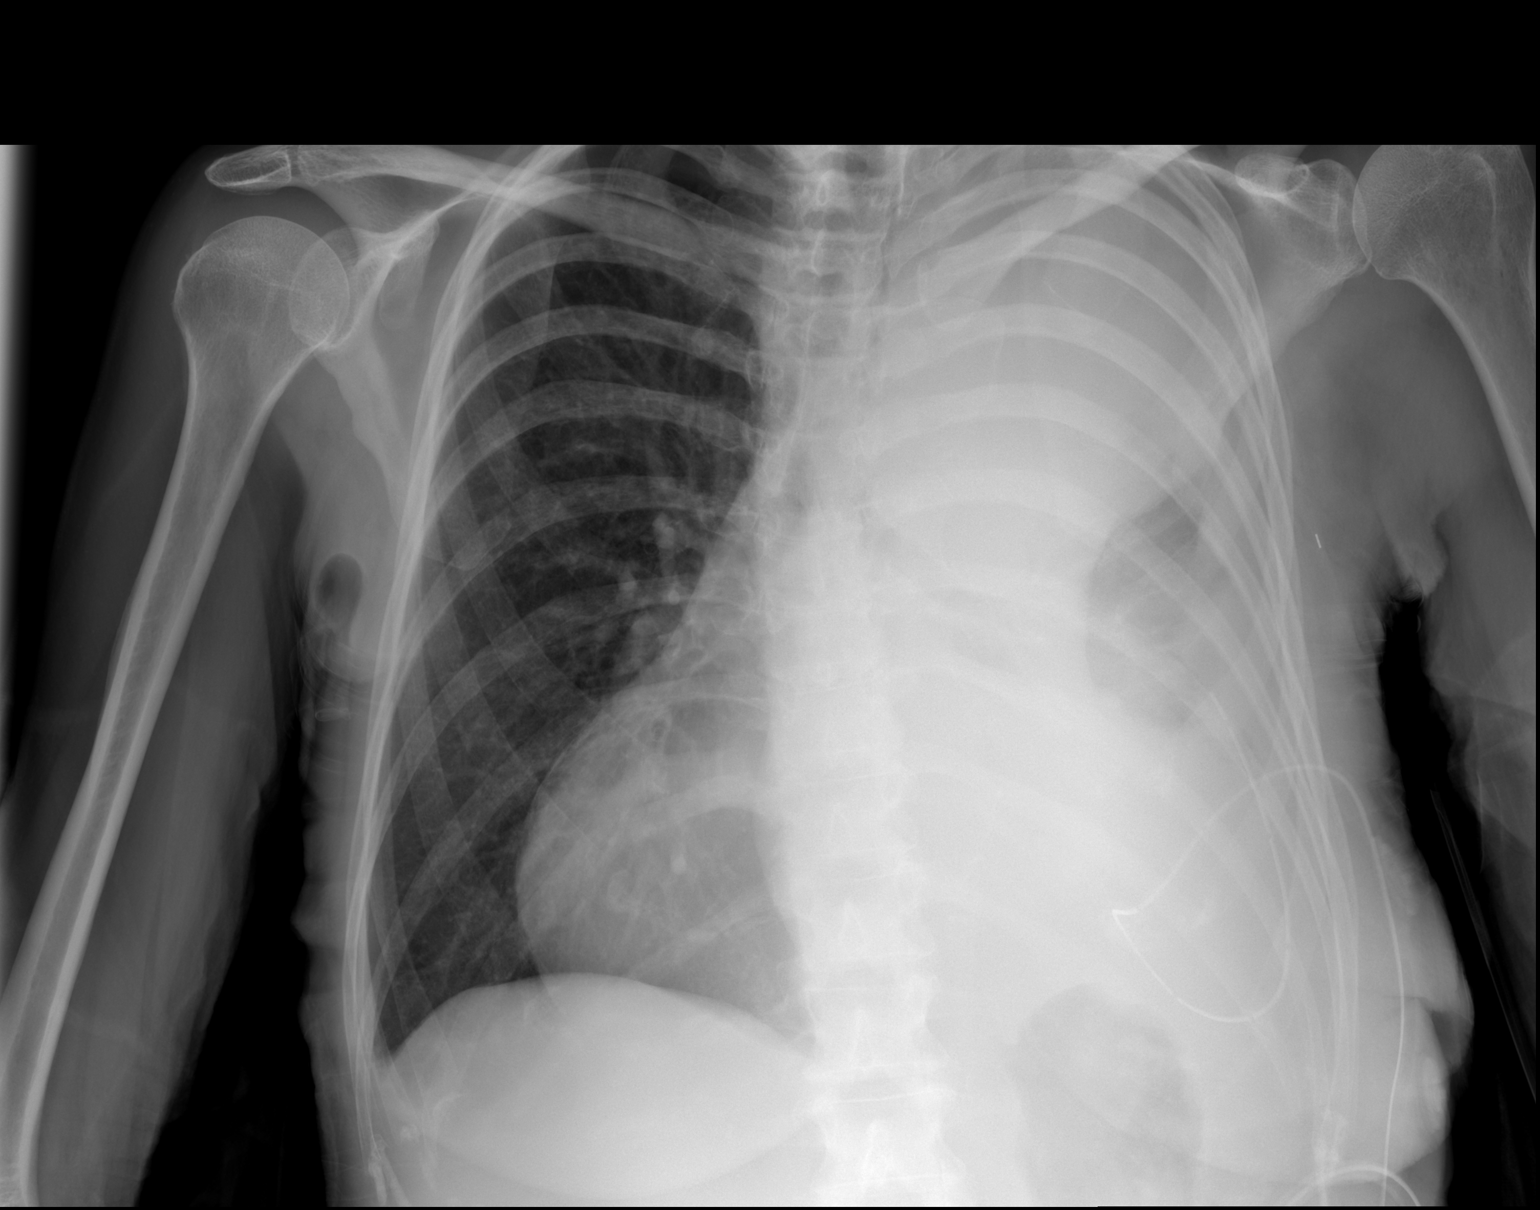

[2 of 2 positions shown; findings below may reference images not displayed]

FINDINGS: Unchanged left-sided PleurX catheter. Stable appearance of the chest
with large loculated left pleural effusion and near complete
collapse of the left lung. Large left lower lobe mass is obscured.
The right lung remains clear. Stable cardiomediastinal silhouette
with rightward mediastinal shift. No pneumothorax. No acute osseous
abnormality.
IMPRESSION: 1. Stable large loculated left pleural effusion and near complete
collapse of the left lung.

## 2021-06-23 MED ORDER — SODIUM CHLORIDE 0.9% FLUSH
3.0000 mL | Freq: Two times a day (BID) | INTRAVENOUS | Status: DC
  Administered 2021-06-24 – 2021-06-25 (×2): 3 mL via INTRAVENOUS

## 2021-06-23 MED ORDER — SODIUM CHLORIDE 0.9 % IV SOLN: INTRAVENOUS | Status: DC

## 2021-06-23 MED ORDER — ENOXAPARIN SODIUM 30 MG/0.3ML IJ SOSY
30.0000 mg | PREFILLED_SYRINGE | INTRAMUSCULAR | Status: DC
  Administered 2021-06-23: 30 mg via SUBCUTANEOUS
  Filled 2021-06-23: qty 0.3

## 2021-06-23 MED ORDER — INSULIN ASPART 100 UNIT/ML IJ SOLN
0.0000 [IU] | Freq: Three times a day (TID) | INTRAMUSCULAR | Status: DC
  Administered 2021-06-25: 1 [IU] via SUBCUTANEOUS
  Administered 2021-06-25: 2 [IU] via SUBCUTANEOUS
  Administered 2021-06-25 – 2021-06-26 (×2): 1 [IU] via SUBCUTANEOUS
  Filled 2021-06-23: qty 0.09

## 2021-06-23 MED ORDER — DEXAMETHASONE SODIUM PHOSPHATE 10 MG/ML IJ SOLN
10.0000 mg | Freq: Once | INTRAMUSCULAR | Status: AC
  Administered 2021-06-23: 10 mg via INTRAVENOUS
  Filled 2021-06-23: qty 1

## 2021-06-23 MED ORDER — LETROZOLE 2.5 MG PO TABS
2.5000 mg | ORAL_TABLET | Freq: Every day | ORAL | Status: DC
  Administered 2021-06-24 – 2021-06-26 (×3): 2.5 mg via ORAL
  Filled 2021-06-23 (×3): qty 1

## 2021-06-23 MED ORDER — SODIUM CHLORIDE 0.9 % IV SOLN
50.0000 mg | Freq: Once | INTRAVENOUS | Status: AC
  Administered 2021-06-23: 50 mg via INTRAVENOUS
  Filled 2021-06-23: qty 5

## 2021-06-23 MED ORDER — ACETAMINOPHEN 325 MG PO TABS: 650.0000 mg | ORAL_TABLET | Freq: Four times a day (QID) | ORAL | Status: DC | PRN

## 2021-06-23 MED ORDER — POLYETHYLENE GLYCOL 3350 17 G PO PACK
17.0000 g | PACK | Freq: Every day | ORAL | Status: DC | PRN
  Administered 2021-06-26: 17 g via ORAL
  Filled 2021-06-23: qty 1

## 2021-06-23 MED ORDER — GADOBUTROL 1 MMOL/ML IV SOLN
5.5000 mL | Freq: Once | INTRAVENOUS | Status: AC | PRN
  Administered 2021-06-23: 5.5 mL via INTRAVENOUS

## 2021-06-23 MED ORDER — LACOSAMIDE 50 MG PO TABS
50.0000 mg | ORAL_TABLET | Freq: Two times a day (BID) | ORAL | Status: DC
  Administered 2021-06-24 – 2021-06-26 (×5): 50 mg via ORAL
  Filled 2021-06-23 (×5): qty 1

## 2021-06-23 MED ORDER — DEXAMETHASONE SODIUM PHOSPHATE 4 MG/ML IJ SOLN
2.0000 mg | Freq: Four times a day (QID) | INTRAMUSCULAR | Status: DC
  Administered 2021-06-24 – 2021-06-26 (×10): 2 mg via INTRAVENOUS
  Filled 2021-06-23 (×10): qty 1

## 2021-06-23 MED ORDER — ACETAMINOPHEN 650 MG RE SUPP: 650.0000 mg | Freq: Four times a day (QID) | RECTAL | Status: DC | PRN

## 2021-06-23 MED ORDER — THYROID 60 MG PO TABS
60.0000 mg | ORAL_TABLET | Freq: Every day | ORAL | Status: DC
  Administered 2021-06-24 – 2021-06-26 (×3): 60 mg via ORAL
  Filled 2021-06-23 (×5): qty 1

## 2021-06-23 NOTE — ED Notes (Signed)
Female purewick place on pt

## 2021-06-23 NOTE — ED Notes (Signed)
Pt. In MRI.

## 2021-06-23 NOTE — ED Triage Notes (Signed)
Pt on oral chemo, starting 2 days ago weak in legs and had difficulty walking. Started a new supplement the day before this difficulty walking. Pt has brain, lung and breast cancer.

## 2021-06-23 NOTE — ED Provider Notes (Signed)
[facility] DEPT Provider Note   CSN: [id number] Arrival date & time: 06/23/21  1544     History  Chief Complaint  Patient presents with   Weakness    Tami Stone is a 71 y.o. female.  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  DCA Sodium dichloroacetate 500mg , started a few days ago   Today had a drawing tightening of stomach and releasing for a minute or so, and head was twitching back and forth  Dizziness for a few days now Difficulty walking over the last week Legs feel like they have weights on them, right worse than left Legs are always numb up to the knees, no other new numbness or weakness on initial hx but then reports does feel like having weakness when walking and right worse than left, both right arm and right leg but also notes she has not been eating well and is feeling weak all over Not really having headaches-some mild but no different from before No change in vision No facial droop Feels off balance Room spinning dizziness if turns just right No nausea or vomiting   Has had radiosurgery for CNS mets, with some question per oncology notes as to whether or not she did not have whole brain radiation.  Most recent oncology plan is for cycles of combination immunotherapy and then follow-up  Back hurts every once in a while if stands a long time No urinary retention No loss of control of bowel No dysuria No fever, cough, cpr Chronic dyspnea unchanged, has pleurex catheter in place  Not sure if has had seizure before--slumped back into chair, syncope and head bobbed Was started on keppra but didn't  tolerate it then vimpat-eventually stopped this as well.   Past Medical History:  Diagnosis Date   Diabetes ([place])    Does not check bs at home   Dyspnea    High cholesterol    Lung cancer ([place])    04/2020    Home Medications Prior to Admission medications   Medication Sig Start Date End Date Taking? Authorizing Provider  AMBULATORY NON FORMULARY MEDICATION 2 capsules 2 (two) times daily. Medication Name: [place] tell mushroom supplement    [provider]  Black Pepper-Turmeric (TURMERIC PLUS BLACK PEPPER EXT PO) Take 1 capsule by mouth in the morning, at noon, and at bedtime.    [provider]  Coenzyme Q10 (CO Q 10 PO) Take 300 mg by mouth at bedtime.    [provider]  dabrafenib mesylate (TAFINLAR) 75 MG capsule Take 2 capsules (150 mg total) by mouth 2 (two) times daily. Take on an empty stomach 1 hour before or 2 hours after meals. 04/23/21   Volanda Napoleon, MD  DANDELION PO Take 2,250 mg by mouth daily with supper.    [provider]  Diindolylmethane POWD Take 200 mg by mouth every evening. DIM 200 mg    [provider]  Flavoring Agent (APRICOT FLAVOR) POWD Take 2 capsules by mouth daily. Apricot Seed Supplement    [provider]  ibuprofen (ADVIL) 200 MG tablet Take 400 mg by mouth every 6 (six) hours as needed for mild pain.  [provider]  IODINE, KELP, PO Take 1 capsule by mouth in the morning.    [provider]  IVERMECTIN PO Take 2 mLs by mouth in the morning. Ivermectin oil - 10 mg/ml    [provider]  letrozole (FEMARA) 2.5 MG tablet Take 1 tablet (2.5 mg total) by mouth daily. 04/28/21   Volanda Napoleon, MD  metFORMIN (GLUCOPHAGE) 500 MG tablet Take 1 tablet (500 mg total) by mouth 3 (three) times daily. 03/03/21   Volanda Napoleon, MD  Misc Natural Products (MAGIC MUSHROOM MIX) CAPS Take 1 tablet by mouth daily.    [provider]  NP THYROID 60 MG tablet Take 60 mg by  mouth daily. 02/24/20   [provider]  OVER THE COUNTER MEDICATION Take 2 capsules by mouth 3 (three) times daily. Protectival    [provider]  polyethylene glycol (MIRALAX / GLYCOLAX) 17 g packet Take 17 g by mouth daily. Patient taking differently: Take 17 g by mouth daily as needed for moderate constipation. 01/23/21   Georgette Shell, MD  Prenatal Vit-Fe Fumarate-FA (MULTIVITAMIN-PRENATAL) 27-0.8 MG TABS tablet Take 1 tablet by mouth daily at 12 noon.    [provider]  trametinib dimethyl sulfoxide (MEKINIST) 2 MG tablet Take 1 tablet (2 mg total) by mouth daily. Take 1 hour before or 2 hours after a meal. Store refrigerated in original container. 06/02/20   Curt Bears, MD  Zinc 100 MG TABS Take 100 mg by mouth daily.    [provider]      Allergies    Patient has no known allergies.    Review of Systems   Review of Systems SEE ABOVE Physical Exam Updated Vital Signs BP 117/71    Pulse (!) 105    Temp 98 F (36.7 C) (Oral)    Resp (!) 26    Ht 5\' 2"  (1.575 m)    Wt 49.9 kg    SpO2 97%    BMI 20.12 kg/m  Physical Exam Vitals and nursing note reviewed.  Constitutional:      General: She is not in acute distress.    Appearance: Normal appearance. She is well-developed and underweight. She is not ill-appearing or diaphoretic.  HENT:     Head: Normocephalic and atraumatic.  Eyes:     General: No visual field deficit.    Extraocular Movements: Extraocular movements intact.     Conjunctiva/sclera: Conjunctivae normal.     Pupils: Pupils are equal, round, and reactive to light.  Cardiovascular:     Rate and Rhythm: Normal rate and regular rhythm.     Pulses: Normal pulses.     Heart sounds: Normal heart sounds. No murmur heard.   No friction rub. No gallop.  Pulmonary:     Effort: Pulmonary effort is normal. No respiratory distress.  Abdominal:     General: There is no distension.     Palpations: Abdomen is soft.      Tenderness: There is no abdominal tenderness. There is no guarding.  Musculoskeletal:        General: No swelling or tenderness.     Cervical back: Normal range of motion.  Skin:    General: Skin is warm and dry.     Findings: No erythema or rash.  Neurological:     General: No focal deficit present.     Mental Status: She is alert and oriented to person, place, and time.     GCS: GCS eye subscore  is 4. GCS verbal subscore is 5. GCS motor subscore is 6.     Cranial Nerves: No cranial nerve deficit, dysarthria or facial asymmetry.     Sensory: Sensory deficit (below knees bilaterally (baseline)) present.     Motor: Weakness (RLE and RUE mild weakness, slight drift) present. No tremor.     Coordination: Coordination normal. Finger-Nose-Finger Test normal.     Gait: Gait normal.    ED Results / Procedures / Treatments   Labs (all labs ordered are listed, but only abnormal results are displayed) Labs Reviewed  CBC WITH DIFFERENTIAL/PLATELET - Abnormal; Notable for the following components:      Result Value   WBC 13.3 (*)    RBC 3.43 (*)    Hemoglobin 8.8 (*)    HCT 28.8 (*)    MCH 25.7 (*)    RDW 17.5 (*)    Platelets 759 (*)    Neutro Abs 8.8 (*)    Monocytes Absolute 1.2 (*)    Eosinophils Absolute 0.8 (*)    All other components within normal limits  COMPREHENSIVE METABOLIC PANEL - Abnormal; Notable for the following components:   Sodium 130 (*)    Chloride 92 (*)    Glucose, Bld 113 (*)    Calcium 8.6 (*)    Albumin 2.6 (*)    AST 13 (*)    All other components within normal limits  RESP PANEL BY RT-PCR (FLU A&B, COVID) ARPGX2  URINALYSIS, ROUTINE W REFLEX MICROSCOPIC  COMPREHENSIVE METABOLIC PANEL  CBC  TROPONIN I (HIGH SENSITIVITY)    EKG EKG Interpretation  Date/Time:  Wednesday June 23 2021 16:05:41 EST Ventricular Rate:  108 PR Interval:  149 QRS Duration: 84 QT Interval:  358 QTC Calculation: 480 R Axis:   59 Text Interpretation: Sinus tachycardia  Low voltage, extremity and precordial leads Nonspecific T abnormalities, diffuse leads Since prior ECG, TW abnormalities are new. Confirmed by Gareth Morgan [phone]) on 06/23/2021 4:58:42 PM  Radiology DG Chest 2 View  Result Date: 06/23/2021 CLINICAL DATA:  New onset leg weakness and difficulty walking. EXAM: CHEST - 2 VIEW COMPARISON:  Chest x-ray dated May 28, 2021. FINDINGS: Unchanged left-sided PleurX catheter. Stable appearance of the chest with large loculated left pleural effusion and near complete collapse of the left lung. Large left lower lobe mass is obscured. The right lung remains clear. Stable cardiomediastinal silhouette with rightward mediastinal shift. No pneumothorax. No acute osseous abnormality. IMPRESSION: 1. Stable large loculated left pleural effusion and near complete collapse of the left lung. Electronically Signed   By: Titus Dubin M.D.   On: 06/23/2021 16:20   CT HEAD WO CONTRAST (5MM)  Result Date: 06/23/2021 CLINICAL DATA:  Weakness.  History of metastatic lung cancer. EXAM: CT HEAD WITHOUT CONTRAST TECHNIQUE: Contiguous axial images were obtained from the base of the skull through the vertex without intravenous contrast. RADIATION DOSE REDUCTION: This exam was performed according to the departmental dose-optimization program which includes automated exposure control, adjustment of the mA and/or kV according to patient size and/or use of iterative reconstruction technique. COMPARISON:  01/28/2021 and brain MR 05/21/2021 FINDINGS: Brain: Again noted is vasogenic edema in the left parietal lobe and this may have progressed since the MRI on 05/21/2021. There is new vasogenic edema in the right parietooccipital region and concerning for a new underlying lesion. There is also new low-density in the right frontal lobe on series 2, image 12. No evidence for acute hemorrhage. Ventricles are stable and there is no  significant midline shift. Vascular: No hyperdense vessel or  unexpected calcification. Skull: Normal. Negative for fracture or focal lesion. Sinuses/Orbits: Visualized sinuses are clear. Other: None IMPRESSION: 1. Patient has known intracranial metastatic disease and there are new areas of vasogenic edema in the right frontal lobe and right parietal-occipital region. In addition, the edema in the left parietal lobe may have increased. Findings are suggestive for progression of intracranial metastatic disease. This could be better evaluated with MRI. 2. No evidence for acute hemorrhage. Electronically Signed   By: Markus Daft M.D.   On: 06/23/2021 16:31    Procedures Procedures    Medications Ordered in ED Medications  lacosamide (VIMPAT) 50 mg in sodium chloride 0.9 % 25 mL IVPB (has no administration in time range)  dexamethasone (DECADRON) injection 2 mg (has no administration in time range)  lacosamide (VIMPAT) tablet 50 mg (has no administration in time range)  letrozole [facility]) tablet 2.5 mg (has no administration in time range)  thyroid (ARMOUR) tablet 60 mg (has no administration in time range)  enoxaparin (LOVENOX) injection 40 mg (has no administration in time range)  sodium chloride flush (NS) 0.9 % injection 3 mL (has no administration in time range)  0.9 %  sodium chloride infusion (has no administration in time range)  acetaminophen (TYLENOL) tablet 650 mg (has no administration in time range)    Or  acetaminophen (TYLENOL) suppository 650 mg (has no administration in time range)  polyethylene glycol (MIRALAX / GLYCOLAX) packet 17 g (has no administration in time range)  dexamethasone (DECADRON) injection 10 mg (10 mg Intravenous Given 06/23/21 1755)    ED Course/ Medical Decision Making/ A&P                           Medical Decision Making   71 year old female with a history of 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  No continuing abdominal pain, have low suspicion for acute intraabdominal pathology. ECG evaluated by me and shows diffuse nonspecific TW changes. Troponin evaluated by me and negative. Do not suspect episode represents ACS. No increased dyspnea, doubt PE.  CXR evaluated by me and radiology and is stable with left lung collapse and pleural effusion with pleurex catheter in place.  While somewhat atypical, brief one minute episode of involuntary movement followed by improvement in patient with known brain metastases concering for possible partial seizure. She is now at baseline. Did not tolerate keppra previously, will start vimpat again.    CT head completed today shows no intracranial metastatic disease with new areas of vasogenic edema in the right frontal lobe and right parietal occipital region, with concern for increase edema in the left parietal lobe and findings consistent with progression of intracranial metastatic disease.  Given her increased weakness, difficulty ambulating, dizziness, and likely focal seizure feel admission is appropriate.  Discussed with Dr. Mickeal Skinner NeuroOncology, recommends decadron, anti-epileptic and he will evaluate while she is in the hospital.  Discussed with Dr. Mickeal Skinner and do not feel additional NSU or Neurology evaluation indicated at this time.    Pt admitted to hospital for worsening intracranial metastases with vasogenic edema, worsening weakness and likely partial seizure for NeuroOncology evaluation, steroids, antiepileptics. Discussed plan with family and hospitalist.  Final Clinical Impression(s) / ED Diagnoses Final diagnoses:  Vasogenic edema (HCC)  Brain metastases (H[facility])  Partial seizure ([place])  Weakness    Rx / DC Orders ED Discharge Orders     None          Gareth Morgan, MD 06/23/21 1916

## 2021-06-23 NOTE — Progress Notes (Signed)
Pharmacist Chemotherapy Monitoring - Initial Assessment    Anticipated start date: 118/2023   The following has been reviewed per standard work regarding the patient's treatment regimen: The patient's diagnosis, treatment plan and drug doses, and organ/hematologic function Lab orders and baseline tests specific to treatment regimen  The treatment plan start date, drug sequencing, and pre-medications Prior authorization status  Patient's documented medication list, including drug-drug interaction screen and prescriptions for anti-emetics and supportive care specific to the treatment regimen The drug concentrations, fluid compatibility, administration routes, and timing of the medications to be used The patient's access for treatment and lifetime cumulative dose history, if applicable  The patient's medication allergies and previous infusion related reactions, if applicable   Changes made to treatment plan:  N/A  Follow up needed:  N/A   Tami Stone, RPH, 06/23/2021  1:52 PM

## 2021-06-23 NOTE — Telephone Encounter (Signed)
Message received from patient stating that she is having a "hard time walking" is thinking of going to the ER and would like a call back.  Call placed back to patient and pt states that she is unable to walk without assistance, had an intermittent "tightening" feeling to her stomach this morning along with her "head going back and forth" which she believes to have been a seizure.  Pt instructed to go to the [facility] ER now.  Pt states that her husband is with her and will take her to the [facility] ER now.  Dr. Marin Olp notified.

## 2021-06-23 NOTE — ED Provider Triage Note (Signed)
Emergency Medicine Provider Triage Evaluation Note  Tami Stone , a 71 y.o. female  was evaluated in triage.  Pt complains of bilateral leg weakness but generalized weakness throughout.  Notably patient is a lung cancer patient she mentions that she has breast cancer as well seems to have brain mets.  Tells me she has been on steroids in the past but states that she had bad side effects with steroids and is no longer on them.  She is uncertain what her goals of care are for her cancer treatment.  Seem to think that perhaps they are attempting to cure her metastatic cancer.  Denies any chest pain or dyspnea.  States that her fatigue has been primarily over the past 2 days she states that she started a new medication 2 days ago.  She did have an episode where her head was jerking left and right momentarily but states that this resolved on its own and she did not have any confusion syncope bowel or bladder incontinence nausea or vomiting  Review of Systems  Positive: Fatigue Negative: Fever, chest pain, shortness of breath  Physical Exam  There were no vitals taken for this visit. Gen:   Awake, no distress emaciated, cachectic, ill-appearing Resp:  Normal effort, significantly diminished lung sounds in left lower lung MSK:   Moves extremities without difficulty  Other:  Smile symmetric, strength intact in all 4 extremities sensation intact in all 4 extremities moves all 4 extremities without difficulty.  Diffusely weak but no asymmetric weakness.  EOMI.  Shrug symmetric.  Medical Decision Making  Medically screening exam initiated at 3:57 PM.  Appropriate orders placed.  Floreine Dungee was informed that the remainder of the evaluation will be completed by another provider, this initial triage assessment does not replace that evaluation, and the importance of remaining in the ED until their evaluation is complete.  Patient is 71 year old female significant cancer history.  Complaining  primarily of generalized weakness and fatigue.     Tami Stone, [place] 06/23/21 1603

## 2021-06-23 NOTE — ED Notes (Signed)
Patient transported to MRI 

## 2021-06-23 NOTE — H&P (Signed)
History and Physical   Tami Stone HWE:[id number] DOB: 1951/03/09 DOA: 06/23/2021  PCP: Gaynelle Cage, MD   Patient coming from: Home  Chief Complaint: Difficulty walking and dizziness, head twitching  HPI: Tami Stone is a 71 y.o. female with medical history significant of syncope, breast cancer on Femara, lung cancer with bone, brain, pleural mets, diabetes, hyperlipidemia who presents with ongoing neurologic abnormalities.  Patient states that she has had 1 week of difficulty walking and dizziness.  She also had episode today of head twitching and tightness on the right side of her body which prompted her to come to the ED. She reports chronic neuropathy in LE but new heaviness and difficulty walking.  Patient has known history of breast and lung cancer with metastatic lung cancer including brain mets.  Patient denies fevers, chills, chest pain, shortness of breath, abdominal pain, constipation, diarrhea, nausea, vomiting.   ED Course: Vital signs in the ED significant for heart rate in the 100s, respiratory rate in the teens to 20s.  Lab work-up showed CMP with sodium 130, chloride 97, glucose 117, calcium 8.6 which improved considering albumin of 2.6.  CBC showed mild leukocytosis to 13.3, hemoglobin of 8.8 down from baseline of 10, platelets elevated at 759.  Troponin negative.  Respiratory panel flu and COVID-negative.  Urinalysis pending.  Chest x-ray with stable large loculated left pleural effusion and near complete collapse of the left lung.  CT head showed known mets with new areas of vasogenic edema and possible increase in vasogenic edema at the left parietal lobe.  MRI ordered but not yet obtained.  EDP discussed case with neurooncology who recommended starting steroids and antiepileptic noting that patient had not tolerated Keppra in the past and had been on Vimpat at one point.  Neuro oncology see in the morning, they do not believe that at this time patient needed  additional consults to neurology nor neurosurgery.  Patient started on Decadron and Vimpat in the ED.  Review of Systems: As per HPI otherwise all other systems reviewed and are negative.  Past Medical History:  Diagnosis Date   Diabetes ([place])    Does not check bs at home   Dyspnea    High cholesterol    Lung cancer ([place])    04/2020    Past Surgical History:  Procedure Laterality Date   ABDOMINAL HYSTERECTOMY     BREAST BIOPSY Right    BRONCHIAL BIOPSY  04/30/2020   Procedure: BRONCHIAL BIOPSIES;  Surgeon: Collene Gobble, MD;  Location: [place];  Service: Cardiopulmonary;;   BRONCHIAL BRUSHINGS  04/30/2020   Procedure: BRONCHIAL BRUSHINGS;  Surgeon: Collene Gobble, MD;  Location: [place];  Service: Cardiopulmonary;;   BRONCHIAL BRUSHINGS  01/20/2021   Procedure: BRONCHIAL BRUSHINGS;  Surgeon: Laurin Coder, MD;  Location: [facility];  Service: Endoscopy;;   BRONCHIAL WASHINGS  01/20/2021   Procedure: BRONCHIAL WASHINGS;  Surgeon: Laurin Coder, MD;  Location: [facility];  Service: Endoscopy;;   CHEST TUBE INSERTION Left 05/28/2021   Procedure: INSERTION PLEURAL DRAINAGE CATHETER;  Surgeon: [name], DO;  Location: [place];  Service: Pulmonary;  Laterality: Left;  Prior to bronchoscopy   HEMOSTASIS CONTROL  04/30/2020   Procedure: HEMOSTASIS CONTROL;  Surgeon: Collene Gobble, MD;  Location: [place];  Service: Cardiopulmonary;;   IR THORACENTESIS ASP PLEURAL SPACE W/IMG GUIDE  04/15/2021   IR THORACENTESIS ASP PLEURAL SPACE W/IMG GUIDE  05/03/2021   IR THORACENTESIS ASP PLEURAL SPACE W/IMG GUIDE  05/11/2021  TUBAL LIGATION     VIDEO BRONCHOSCOPY N/A 04/30/2020   Procedure: VIDEO BRONCHOSCOPY WITH FLUORO;  Surgeon: Collene Gobble, MD;  Location: [facility] ENDOSCOPY;  Service: Cardiopulmonary;  Laterality: N/A;   VIDEO BRONCHOSCOPY N/A 01/20/2021   Procedure: VIDEO BRONCHOSCOPY WITHOUT FLUORO;  Surgeon: Laurin Coder, MD;  Location: [facility]  END[facility];  Service: Endoscopy;  Laterality: N/A;   VIDEO BRONCHOSCOPY Left 05/28/2021   Procedure: VIDEO BRONCHOSCOPY WITHOUT FLUORO;  Surgeon: [name], DO;  Location: [place];  Service: Pulmonary;  Laterality: Left;    Social History  reports that she has never smoked. She has never used smokeless tobacco. She reports that she does not currently use alcohol. She reports that she does not use drugs.  No Known Allergies  Family History  Problem Relation Age of Onset   Breast cancer Sister        half sister on maternal side   Colon cancer Neg Hx    Esophageal cancer Neg Hx   Reviewed on admission  Prior to Admission medications   Medication Sig Start Date End Date Taking? Authorizing Provider  AMBULATORY NON FORMULARY MEDICATION 2 capsules 2 (two) times daily. Medication Name: [place] tell mushroom supplement    [provider]  Black Pepper-Turmeric (TURMERIC PLUS BLACK PEPPER EXT PO) Take 1 capsule by mouth in the morning, at noon, and at bedtime.    [provider]  Coenzyme Q10 (CO Q 10 PO) Take 300 mg by mouth at bedtime.    [provider]  dabrafenib mesylate (TAFINLAR) 75 MG capsule Take 2 capsules (150 mg total) by mouth 2 (two) times daily. Take on an empty stomach 1 hour before or 2 hours after meals. 04/23/21   Volanda Napoleon, MD  DANDELION PO Take 2,250 mg by mouth daily with supper.    [provider]  Diindolylmethane POWD Take 200 mg by mouth every evening. DIM 200 mg    [provider]  Flavoring Agent (APRICOT FLAVOR) POWD Take 2 capsules by mouth daily. Apricot Seed Supplement    [provider]  ibuprofen (ADVIL) 200 MG tablet Take 400 mg by mouth every 6 (six) hours as needed for mild pain.    [provider]  IODINE, KELP, PO Take 1 capsule by mouth in the morning.    [provider]  IVERMECTIN PO Take 2 mLs by mouth in the morning. Ivermectin oil - 10 mg/ml    [provider]  letrozole (FEMARA) 2.5 MG tablet Take 1 tablet (2.5 mg total) by mouth daily. 04/28/21   Volanda Napoleon, MD  metFORMIN (GLUCOPHAGE) 500 MG tablet Take 1 tablet (500 mg total) by mouth 3 (three) times daily. 03/03/21   Volanda Napoleon, MD  Misc Natural Products (MAGIC MUSHROOM MIX) CAPS Take 1 tablet by mouth daily.    [provider]  NP THYROID 60 MG tablet Take 60 mg by mouth daily. 02/24/20   [provider]  OVER THE COUNTER MEDICATION Take 2 capsules by mouth 3 (three) times daily. Protectival    [provider]  polyethylene glycol (MIRALAX / GLYCOLAX) 17 g packet Take 17 g by mouth daily. Patient taking differently: Take 17 g by mouth daily as needed for moderate constipation. 01/23/21   Georgette Shell, MD  Prenatal Vit-Fe Fumarate-FA (MULTIVITAMIN-PRENATAL) 27-0.8 MG TABS tablet Take 1 tablet by mouth daily at 12 noon.    [provider]  trametinib dimethyl sulfoxide (MEKINIST) 2 MG tablet Take  1 tablet (2 mg total) by mouth daily. Take 1 hour before or 2 hours after a meal. Store refrigerated in original container. 06/02/20   Curt Bears, MD  Zinc 100 MG TABS Take 100 mg by mouth daily.    [provider]    Physical Exam: Vitals:   06/23/21 1726 06/23/21 1800 06/23/21 1900 06/23/21 1903  BP: 118/73 107/73 117/71   Pulse: (!) 104 (!) 102 (!) 105 (!) 105  Resp: (!) 28 20 (!) 34 (!) 26  Temp:      TempSrc:      SpO2: 100% 100% 97% 97%  Weight:      Height:       Physical Exam Constitutional:      General: She is not in acute distress.    Appearance: Normal appearance.  HENT:     Head: Normocephalic and atraumatic.     Mouth/Throat:     Mouth: Mucous membranes are moist.     Pharynx: Oropharynx is clear.  Eyes:     Extraocular Movements: Extraocular movements intact.     Pupils: Pupils are equal, round, and reactive to light.  Cardiovascular:     Rate and Rhythm: Normal rate and regular rhythm.      Pulses: Normal pulses.     Heart sounds: Normal heart sounds.  Pulmonary:     Effort: Pulmonary effort is normal. No respiratory distress.     Breath sounds: Normal breath sounds.  Abdominal:     General: Bowel sounds are normal. There is no distension.     Palpations: Abdomen is soft.     Tenderness: There is no abdominal tenderness.  Musculoskeletal:        General: No swelling or deformity.  Skin:    General: Skin is warm and dry.  Neurological:     Mental Status: She is oriented to person, place, and time.     Comments: Chronic neuropathy.  Possible, 4/5 right-sided weakness at RLE.   Labs on Admission: I have personally reviewed following labs and imaging studies  CBC: Recent Labs  Lab 06/23/21 1623  WBC 13.3*  NEUTROABS 8.8*  HGB 8.8*  HCT 28.8*  MCV 84.0  PLT 759*    Basic Metabolic Panel: Recent Labs  Lab 06/23/21 1623  NA 130*  K 4.1  CL 92*  CO2 25  GLUCOSE 113*  BUN 14  CREATININE 0.61  CALCIUM 8.6*    GFR: Estimated Creatinine Clearance: 50.8 mL/min (by C-G formula based on SCr of 0.61 mg/dL).  Liver Function Tests: Recent Labs  Lab 06/23/21 1623  AST 13*  ALT 10  ALKPHOS 106  BILITOT 0.5  PROT 7.1  ALBUMIN 2.6*    Urine analysis: No results found for: COLORURINE, APPEARANCEUR, LABSPEC, PHURINE, GLUCOSEU, HGBUR, BILIRUBINUR, KETONESUR, PROTEINUR, UROBILINOGEN, NITRITE, LEUKOCYTESUR  Radiological Exams on Admission: DG Chest 2 View  Result Date: 06/23/2021 CLINICAL DATA:  New onset leg weakness and difficulty walking. EXAM: CHEST - 2 VIEW COMPARISON:  Chest x-ray dated May 28, 2021. FINDINGS: Unchanged left-sided PleurX catheter. Stable appearance of the chest with large loculated left pleural effusion and near complete collapse of the left lung. Large left lower lobe mass is obscured. The right lung remains clear. Stable cardiomediastinal silhouette with rightward mediastinal shift. No pneumothorax. No acute osseous abnormality.  IMPRESSION: 1. Stable large loculated left pleural effusion and near complete collapse of the left lung. Electronically Signed   By: Titus Dubin M.D.   On: 06/23/2021 16:20   CT  HEAD WO CONTRAST (5MM)  Result Date: 06/23/2021 CLINICAL DATA:  Weakness.  History of metastatic lung cancer. EXAM: CT HEAD WITHOUT CONTRAST TECHNIQUE: Contiguous axial images were obtained from the base of the skull through the vertex without intravenous contrast. RADIATION DOSE REDUCTION: This exam was performed according to the departmental dose-optimization program which includes automated exposure control, adjustment of the mA and/or kV according to patient size and/or use of iterative reconstruction technique. COMPARISON:  01/28/2021 and brain MR 05/21/2021 FINDINGS: Brain: Again noted is vasogenic edema in the left parietal lobe and this may have progressed since the MRI on 05/21/2021. There is new vasogenic edema in the right parietooccipital region and concerning for a new underlying lesion. There is also new low-density in the right frontal lobe on series 2, image 12. No evidence for acute hemorrhage. Ventricles are stable and there is no significant midline shift. Vascular: No hyperdense vessel or unexpected calcification. Skull: Normal. Negative for fracture or focal lesion. Sinuses/Orbits: Visualized sinuses are clear. Other: None IMPRESSION: 1. Patient has known intracranial metastatic disease and there are new areas of vasogenic edema in the right frontal lobe and right parietal-occipital region. In addition, the edema in the left parietal lobe may have increased. Findings are suggestive for progression of intracranial metastatic disease. This could be better evaluated with MRI. 2. No evidence for acute hemorrhage. Electronically Signed   By: Markus Daft M.D.   On: 06/23/2021 16:31    EKG: Independently reviewed.  Sinus tachycardia at 108 bpm.  Low voltage in multiple leads.  Nonspecific T wave  flattening.  Assessment/Plan Principal Problem:   Vasogenic edema (HCC) Active Problems:   Adenocarcinoma of left lung, stage 4 (HCC)   Malignant neoplasm of lower-outer quadrant of left breast of female, estrogen receptor positive (HCC)   Pleural effusion on left   Hydropneumothorax   Chest tube in place   Malignant pleural effusion  Vasogenic edema Neuro-deficits Breast cancer Metastatic lung cancer > Patient presenting with 1 week of difficulty walking and dizziness in the setting of known metastatic lung cancer to the brain. > She also had additional episode of head twitching and tightness in the right side of her body today prompting ED evaluation. 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  Pleurx catheter in place on left with large stable loculated left pleural effusion with near complete collapse of left lung noted on chest x-ray today. > Current therapy for lung cancer includes Yervoy and Opdivo.  Previously on Mekinist and Tafinlar. > Neuro oncology consulted in the ED and recommended starting Decadron and Vimpat.  They did not feel that additional evaluation by neurology nor neurosurgery currently indicated. - Monitor on telemetry - Appreciate neuro oncology recommendations - Continue with Decadron.  Received initial 10 mg dose in the ED will continue with 2 mg every 6 hours for total daily dose of 8 mg. - Continue with Vimpat, convert to p.o. 50 mg twice daily for now  Hypothyroidism - Continue home thyroid Armour 60 mg daily   DVT prophylaxis: Lovenox  Code Status:   DNR, confirmed on admission Family Communication:  Husband updated at bedside Disposition Plan:   Patient is from:  Home  Anticipated DC to:  Home  Anticipated DC date:  1 to 3 days  Anticipated DC barriers: None  Consults called:  Neuro oncology consulted by EDP.  We will see the patient in the morning.   Admission status:  Observation,  telemetry  Severity of Illness: The appropriate patient status for this patient is OBSERVATION. Observation status is judged to be reasonable and necessary in order to provide the required intensity of service to ensure the patient's safety. The patient's presenting symptoms, physical exam findings, and initial radiographic and laboratory data in the context of their medical condition is felt to place them at decreased risk for further clinical deterioration. Furthermore, it is anticipated that the patient will be medically stable for discharge from the hospital within 2 midnights of admission.    Marcelyn Bruins MD [facility]  How to contact the [facility] Attending or Consulting provider [place] or covering provider during after hours [place], for this patient?   Check the care team in [facility] and look for a) attending/consulting [facility] provider listed and b) the [facility] team listed Log into [email or web address] and use [place]'s universal password to access. If you do not have the password, please contact the hospital operator. Locate the [facility] provider you are looking for under [facility] and page to a number that you can be directly reached. If you still have difficulty reaching the provider, please page the [facility] (Director on Call) for the Hospitalists listed on amion for assistance.  06/23/2021, 7:26 PM

## 2021-06-24 ENCOUNTER — Encounter (HOSPITAL_COMMUNITY): Payer: Self-pay | Admitting: Internal Medicine

## 2021-06-24 DIAGNOSIS — E871 Hypo-osmolality and hyponatremia: Secondary | ICD-10-CM

## 2021-06-24 DIAGNOSIS — D638 Anemia in other chronic diseases classified elsewhere: Secondary | ICD-10-CM

## 2021-06-24 DIAGNOSIS — R42 Dizziness and giddiness: Secondary | ICD-10-CM

## 2021-06-24 DIAGNOSIS — E1140 Type 2 diabetes mellitus with diabetic neuropathy, unspecified: Secondary | ICD-10-CM

## 2021-06-24 DIAGNOSIS — J90 Pleural effusion, not elsewhere classified: Secondary | ICD-10-CM

## 2021-06-24 LAB — COMPREHENSIVE METABOLIC PANEL
ALT: 10 U/L (ref 0–44)
AST: 11 U/L — ABNORMAL LOW (ref 15–41)
Albumin: 2.3 g/dL — ABNORMAL LOW (ref 3.5–5.0)
Alkaline Phosphatase: 87 U/L (ref 38–126)
Anion gap: 9 (ref 5–15)
BUN: 14 mg/dL (ref 8–23)
CO2: 26 mmol/L (ref 22–32)
Calcium: 7.9 mg/dL — ABNORMAL LOW (ref 8.9–10.3)
Chloride: 95 mmol/L — ABNORMAL LOW (ref 98–111)
Creatinine, Ser: 0.51 mg/dL (ref 0.44–1.00)
GFR, Estimated: >60 mL/min (ref >60)
Glucose, Bld: 104 mg/dL — ABNORMAL HIGH (ref 70–99)
Potassium: 4.4 mmol/L (ref 3.5–5.1)
Sodium: 130 mmol/L — ABNORMAL LOW (ref 135–145)
Total Bilirubin: 0.6 mg/dL (ref 0.3–1.2)
Total Protein: 6.4 g/dL — ABNORMAL LOW (ref 6.5–8.1)

## 2021-06-24 LAB — URINALYSIS, ROUTINE W REFLEX MICROSCOPIC
Bilirubin Urine: NEGATIVE
Glucose, UA: NEGATIVE mg/dL
Hgb urine dipstick: NEGATIVE
Ketones, ur: 20 mg/dL — AB
Leukocytes,Ua: NEGATIVE
Nitrite: NEGATIVE
Protein, ur: NEGATIVE mg/dL
Specific Gravity, Urine: 1.009 (ref 1.005–1.030)
pH: 6 (ref 5.0–8.0)

## 2021-06-24 LAB — CBC
HCT: 26.1 % — ABNORMAL LOW (ref 36.0–46.0)
Hemoglobin: 8.1 g/dL — ABNORMAL LOW (ref 12.0–15.0)
MCH: 26.1 pg (ref 26.0–34.0)
MCHC: 31 g/dL (ref 30.0–36.0)
MCV: 84.2 fL (ref 80.0–100.0)
Platelets: 656 10*3/uL — ABNORMAL HIGH (ref 150–400)
RBC: 3.1 MIL/uL — ABNORMAL LOW (ref 3.87–5.11)
RDW: 17.7 % — ABNORMAL HIGH (ref 11.5–15.5)
WBC: 9 10*3/uL (ref 4.0–10.5)
nRBC: 0 % (ref 0.0–0.2)

## 2021-06-24 LAB — GLUCOSE, CAPILLARY
Glucose-Capillary: 113 mg/dL — ABNORMAL HIGH (ref 70–99)
Glucose-Capillary: 179 mg/dL — ABNORMAL HIGH (ref 70–99)

## 2021-06-24 LAB — CBG MONITORING, ED
Glucose-Capillary: 102 mg/dL — ABNORMAL HIGH (ref 70–99)
Glucose-Capillary: 104 mg/dL — ABNORMAL HIGH (ref 70–99)

## 2021-06-24 MED ORDER — ENOXAPARIN SODIUM 40 MG/0.4ML IJ SOSY
40.0000 mg | PREFILLED_SYRINGE | INTRAMUSCULAR | Status: DC
  Administered 2021-06-24 – 2021-06-25 (×2): 40 mg via SUBCUTANEOUS
  Filled 2021-06-24: qty 0.4

## 2021-06-24 MED ORDER — DOCUSATE SODIUM 100 MG PO CAPS
100.0000 mg | ORAL_CAPSULE | Freq: Two times a day (BID) | ORAL | Status: DC
  Administered 2021-06-24 – 2021-06-26 (×5): 100 mg via ORAL
  Filled 2021-06-24 (×5): qty 1

## 2021-06-24 MED ORDER — GABAPENTIN 300 MG PO CAPS
300.0000 mg | ORAL_CAPSULE | Freq: Every day | ORAL | Status: DC
  Administered 2021-06-24 – 2021-06-25 (×2): 300 mg via ORAL
  Filled 2021-06-24 (×2): qty 1

## 2021-06-24 NOTE — ED Notes (Signed)
Patient currently resting with eyes closed.  Respirations even and unlabored.  Will preform neuro exam when patient wakes

## 2021-06-24 NOTE — Progress Notes (Signed)
PROGRESS NOTE  Tami Stone DZH:[id number] DOB: 1950-07-09   PCP: Gaynelle Cage, MD  Patient is from: Home.  Lives with family.  Independently ambulates at baseline.  DOA: 06/23/2021 LOS: 1  Chief complaints:  Chief Complaint  Patient presents with   Weakness     Brief Narrative / Interim history: 71 year old F with PMH of metastatic lung cancer, history of breast cancer, chronic large left-sided pleural effusion, DM-2, anemia, HLD, hypothyroidism, neuropathy and syncope presenting with difficulty walking, dizziness, worsening neuropathic pain, head twitch and right body tightness, and admitted with worsening vasogenic cerebral edema due to worsening metastatic brain lesions as noted on MRI brain.  Neuro oncology consulted and recommended Decadron and Vimpat.  Oncology consulted.   Subjective: Seen and examined earlier this morning.  No major events overnight of this morning.  She reports nausea but no emesis.  She also reports dizziness that she describes as lightheadedness.  Reports worsening BLE neuropathic pain.  Denies chest pain, dyspnea, abdominal pain or UTI symptoms.  Objective: Vitals:   06/24/21 1300 06/24/21 1334 06/24/21 1444 06/24/21 1518  BP: 98/74 111/80 122/82   Pulse: (!) 106 (!) 106 (!) 103   Resp: (!) 22 (!) 26 (!) 21   Temp:    98 F (36.7 C)  TempSrc:    Oral  SpO2: 96% 94% 94%   Weight:      Height:        Examination:  GENERAL: No apparent distress.  Nontoxic. HEENT: MMM.  Vision and hearing grossly intact.  NECK: Supple.  No apparent JVD.  RESP:  No IWOB.  Fair aeration bilaterally. CVS:  RRR. Heart sounds normal.  ABD/GI/GU: BS+. Abd soft, NTND.  MSK/EXT:  Moves extremities. No apparent deformity. No edema.  SKIN: no apparent skin lesion or wound NEURO: Awake, alert and oriented appropriately.  No apparent focal neuro deficit. PSYCH: Calm. Normal affect.   Procedures:  None  Microbiology summarized: [id number]-22 and influenza PCR  nonreactive.  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  Has Pleurx cath. History of breast cancer Nausea/dizziness -MRI brain with interval enlargement of multiple metastatic lesions and associated vasogenic edema with trace left-to-right shift without hydrocephalus or trapping. -Continue Vimpat and Decadron per neurooncology recommendation -Appreciate input by oncology.  Per oncology, patient is unsure if she wants to proceed with immunotherapy as previously planned -Palliative medicine consulted. -Antiemetics as needed for nausea.  Syncope: Could be related to the above. -Check orthostatic vitals -PT/OT eval  History of NIDDM-2 with neuropathy: On metformin at home. Recent Labs  Lab 06/24/21 0819 06/24/21 1152  GLUCAP 102* 104*  -Check hemoglobin A1c -Continue current insulin regimen. -Start low-dose gabapentin for pain  Anemia of chronic disease: Likely due to malignancy. 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  Stable.  Body mass index is 20.12 kg/m.         DVT prophylaxis:  enoxaparin (LOVENOX) injection 40 mg Start: 06/24/21 2200  Code Status: DNR/DNI Family Communication: Patient and/or RN. Available if any question.  Level of care: Telemetry Status is: Inpatient  Remains inpatient appropriate because:  Due to vasogenic edema from metastatic lung cancer to brain       Consultants:  Neuro oncology over the phone by admitted Oncology Radiation oncology? Palliative medicine   Sch Meds:   Scheduled Meds:  dexamethasone (DECADRON) injection  2 mg Intravenous Q6H   docusate sodium  100 mg Oral BID   enoxaparin (LOVENOX) injection  40 mg Subcutaneous Q24H   insulin aspart  0-9 Units Subcutaneous TID WC   lacosamide  50 mg Oral BID   letrozole  2.5 mg Oral Daily   sodium chloride flush  3 mL Intravenous Q12H   thyroid  60 mg Oral Q0600   Continuous Infusions:  sodium chloride 75 mL/hr at 06/24/21 1105   PRN Meds:.acetaminophen **OR** acetaminophen, polyethylene glycol  Antimicrobials: Anti-infectives (From admission, onward)    None        I have personally reviewed the following labs and images: CBC: Recent Labs  Lab 06/23/21 1623 06/24/21 0721  WBC 13.3* 9.0  NEUTROABS 8.8*  --   HGB 8.8* 8.1*  HCT 28.8* 26.1*  MCV 84.0 84.2  PLT 759* 656*   BMP &GFR Recent Labs  Lab 06/23/21 1623 06/24/21 0721  NA 130* 130*  K 4.1 4.4  CL 92* 95*  CO2 25 26  GLUCOSE 113* 104*  BUN 14 14  CREATININE 0.61 0.51  CALCIUM 8.6* 7.9*   Estimated Creatinine Clearance: 50.8 mL/min (by C-G formula based on SCr of 0.51 mg/dL). Liver & Pancreas: Recent Labs  Lab 06/23/21 1623 06/24/21 0721  AST 13* 11*  ALT 10 10  ALKPHOS 106 87  BILITOT 0.5 0.6  PROT 7.1 6.4*  ALBUMIN 2.6* 2.3*   No results for input(s): LIPASE, AMYLASE in the last 168 hours. No results for input(s): AMMONIA in the last 168 hours. Diabetic: No results for input(s): HGBA1C in the last 72 hours. Recent Labs  Lab 06/24/21 0819 06/24/21 1152  GLUCAP 102* 104*   Cardiac Enzymes: No results for input(s): CKTOTAL, CKMB, CKMBINDEX, TROPONINI in the last 168 hours. No results for input(s): PROBNP in the last 8760 hours. Coagulation Profile: No results for input(s): INR, PROTIME in the last 168 hours. Thyroid Function Tests: No results for input(s): TSH, T4TOTAL, FREET4, T3FREE, THYROIDAB in the last 72 hours. Lipid Profile: No results for input(s): CHOL, HDL, LDLCALC, TRIG, CHOLHDL,  LDLDIRECT in the last 72 hours. Anemia Panel: No results for input(s): VITAMINB12, FOLATE, FERRITIN, TIBC, IRON, RETICCTPCT in the last 72 hours. Urine analysis:    Component Value Date/Time   COLORURINE STRAW (A) 06/24/2021 0642   APPEARANCEUR CLEAR 06/24/2021 0642   LABSPEC 1.009 06/24/2021 0642   PHURINE 6.0 06/24/2021 [place] 06/24/2021 0642   HGBUR NEGATIVE 06/24/2021 0642   BILIRUBINUR NEGATIVE 06/24/2021 0642   KETONESUR 20 (A) 06/24/2021 0642   PROTEINUR NEGATIVE 06/24/2021 0642   NITRITE NEGATIVE 06/24/2021 0642   LEUKOCYTESUR NEGATIVE 06/24/2021 0642   Sepsis Labs: Invalid input(s): PROCALCITONIN, [facility]  Microbiology: Recent Results (from the past 240 hour(s))  Resp Panel by RT-PCR (Flu A&B, Covid) Nasopharyngeal Swab     Status: None   Collection Time: 06/23/21  4:20 PM   Specimen: Nasopharyngeal Swab; Nasopharyngeal(NP) swabs in vial transport medium  Result Value Ref Range Status   SARS Coronavirus 2 by RT PCR NEGATIVE NEGATIVE Final    Comment: (NOTE) SARS-CoV-2 target nucleic acids are NOT DETECTED.  The SARS-CoV-2 RNA is generally detectable in upper respiratory specimens during the acute phase of infection. The lowest  concentration of SARS-CoV-2 viral copies this assay can detect is 138 copies/mL. A negative result does not preclude SARS-Cov-2 infection and should not be used as the sole basis for treatment or other patient management decisions. A negative result may occur with  improper specimen collection/handling, submission of specimen other than nasopharyngeal swab, presence of viral mutation(s) within the areas targeted by this assay, and inadequate number of viral copies(<138 copies/mL). A negative result must be combined with clinical observations, patient history, and epidemiological information. The expected result is Negative.  Fact Sheet for Patients:  EntrepreneurPulse.com.au  Fact Sheet for  Healthcare Providers:  IncredibleEmployment.be  This test is no t yet approved or cleared by the [place] FDA and  has been authorized for detection and/or diagnosis of SARS-CoV-2 by FDA under an Emergency Use Authorization (EUA). This EUA will remain  in effect (meaning this test can be used) for the duration of the COVID-19 declaration under Section 564(b)(1) of the Act, 21 U.S.C.section 360bbb-3(b)(1), unless the authorization is terminated  or revoked sooner.       Influenza A by PCR NEGATIVE NEGATIVE Final   Influenza B by PCR NEGATIVE NEGATIVE Final    Comment: (NOTE) The Xpert Xpress SARS-CoV-2/FLU/RSV plus assay is intended as an aid in the diagnosis of influenza from Nasopharyngeal swab specimens and should not be used as a sole basis for treatment. Nasal washings and aspirates are unacceptable for Xpert Xpress SARS-CoV-2/FLU/RSV testing.  Fact Sheet for Patients: EntrepreneurPulse.com.au  Fact Sheet for Healthcare Providers: IncredibleEmployment.be  This test is not yet approved or cleared by the [place] FDA and has been authorized for detection and/or diagnosis of SARS-CoV-2 by FDA under an Emergency Use Authorization (EUA). This EUA will remain in effect (meaning this test can be used) for the duration of the COVID-19 declaration under Section 564(b)(1) of the Act, 21 U.S.C. section 360bbb-3(b)(1), unless the authorization is terminated or revoked.  Performed at [facility], [place] [address] [place], [place] [address]     Radiology Studies: DG Chest 2 View  Result Date: 06/23/2021 CLINICAL DATA:  New onset leg weakness and difficulty walking. EXAM: CHEST - 2 VIEW COMPARISON:  Chest x-ray dated May 28, 2021. FINDINGS: Unchanged left-sided PleurX catheter. Stable appearance of the chest with large loculated left pleural effusion and near complete collapse of the left lung.  Large left lower lobe mass is obscured. The right lung remains clear. Stable cardiomediastinal silhouette with rightward mediastinal shift. No pneumothorax. No acute osseous abnormality. IMPRESSION: 1. Stable large loculated left pleural effusion and near complete collapse of the left lung. Electronically Signed   By: Titus Dubin M.D.   On: 06/23/2021 16:20   CT HEAD WO CONTRAST (5MM)  Result Date: 06/23/2021 CLINICAL DATA:  Weakness.  History of metastatic lung cancer. EXAM: CT HEAD WITHOUT CONTRAST TECHNIQUE: Contiguous axial images were obtained from the base of the skull through the vertex without intravenous contrast. RADIATION DOSE REDUCTION: This exam was performed according to the departmental dose-optimization program which includes automated exposure control, adjustment of the mA and/or kV according to patient size and/or use of iterative reconstruction technique. COMPARISON:  01/28/2021 and brain MR 05/21/2021 FINDINGS: Brain: Again noted is vasogenic edema in the left parietal lobe and this may have progressed since the MRI on 05/21/2021. There is new vasogenic edema in the right parietooccipital region and concerning for a new underlying lesion. There is also new low-density in the right frontal lobe on series 2, image 12. No evidence  for acute hemorrhage. Ventricles are stable and there is no significant midline shift. Vascular: No hyperdense vessel or unexpected calcification. Skull: Normal. Negative for fracture or focal lesion. Sinuses/Orbits: Visualized sinuses are clear. Other: None IMPRESSION: 1. Patient has known intracranial metastatic disease and there are new areas of vasogenic edema in the right frontal lobe and right parietal-occipital region. In addition, the edema in the left parietal lobe may have increased. Findings are suggestive for progression of intracranial metastatic disease. This could be better evaluated with MRI. 2. No evidence for acute hemorrhage. Electronically  Signed   By: Markus Daft M.D.   On: 06/23/2021 16:31   MR Brain W and Wo Contrast  Result Date: 06/23/2021 CLINICAL DATA:  Initial evaluation for CNS neoplasm, assess treatment response. EXAM: MRI HEAD WITHOUT AND WITH CONTRAST TECHNIQUE: Multiplanar, multiecho pulse sequences of the brain and surrounding structures were obtained without and with intravenous contrast. CONTRAST:  5.32mL GADAVIST GADOBUTROL 1 MMOL/ML IV SOLN COMPARISON:  Head CT from earlier the same day as well as previous brain MRI from 05/21/2021. FINDINGS: Brain: Cerebral volume stable, and remains within normal limits. Underlying mild chronic microvascular ischemic disease noted, also unchanged. No evidence for acute or subacute infarct. No acute or chronic intracranial hemorrhage. No extra-axial collection. No intracranial metastatic disease again seen. Overall, appearance is slightly worsened and progressed from previous, with interval enlargement of multiple lesions as compared to previous. For example, a lesion positioned at the gray-white matter differentiation of the left parietal region now measures up to 11 mm, previously 9 mm (series 16, image 116). A lesion slightly anteriorly within the posterior left frontal region measures up to 10 mm, previously 7 mm (series 16, image 16). On the right, a lesion at the anterior right frontal lobe now measures 6 mm, previously 5 mm (series 16, image 76). A dural based lesion at the right occipital lobe measures 8 mm, previously 5 mm (series 16, image 78). Additionally, several new punctate lesions are seen at the posterior left frontotemporal region, not definitely seen on prior (series 16, images 90, 81). Surrounding vasogenic edema seen about the larger lesions, mildly progressed, and most pronounced at the left parietal lobe. Mild mass effect on the adjacent left lateral ventricle with trace left-to-right shift. No hydrocephalus or trapping. Basilar cisterns remain patent. Vascular: Major  intracranial vascular flow voids are maintained. Skull and upper cervical spine: Craniocervical junction within normal limits. Decreased T1 signal intensity seen within the visualized bone marrow without focal marrow replacing lesion. No scalp soft tissue abnormality. Sinuses/Orbits: Right gaze noted. Globes and orbital soft tissues demonstrate no other acute finding. Paranasal sinuses are largely clear. No mastoid effusion. Inner ear structures grossly normal. Other: None. IMPRESSION: 1. Interval enlargement of multiple metastatic lesions involving the bilateral cerebral hemispheres, with a few new punctate lesions as above, consistent with interval progression of disease. Associated vasogenic edema about the larger lesions, also mildly progressed. Associated trace left-to-right shift without hydrocephalus or trapping. 2. No other acute intracranial abnormality. Electronically Signed   By: Jeannine Boga M.D.   On: 06/23/2021 22:16       Osualdo Hansell T. [place]  If 7PM-7AM, please contact night-coverage [email or web address] 06/24/2021, 3:22 PM

## 2021-06-24 NOTE — ED Notes (Signed)
Patient updated on plan of care

## 2021-06-24 NOTE — Consult Note (Addendum)
Tami Stone  [phone] Fax:([phone] [phone]  Reason for Consultation: Metastatic lung adenocarcinoma, worsening brain mets  HPI: 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  She presented to the emergency department with difficulty walking, dizziness, head twitching.  She has had a 1 week history of difficulty walking and dizziness.  She had an episode on the day of admission including head twitching and tightness on the right side of her body which prompted her to come to the emergency department.  WBC 13.3, hemoglobin 8.8, platelet 759,000, sodium 130, calcium 8.6, albumin 2.6.  CXR showed a loculated left pleural effusion. MRI brain with and without contrast showed interval enlargement and multiple metastatic lesions involving the bilateral cerebral hemispheres consistent with interval progression of disease.  She has associated vasogenic edema about the larger lesions which is also mildly progressed.  Case was discussed with neuro-oncology by the emergency department physician who recommends starting steroids and antiseizure medications.She has been started on dexamethasone and Vimpat.   The patient was seen in the emergency department today.  She reports back pain from positioning.  She reports overall generalized weakness and difficulty walking.  Denies falls. She denies headaches and dizziness.  No visual changes.  She reported that she had some twitching which has now resolved.  She has not having any chest pain or shortness of breath.  Denies abdominal pain, nausea, vomiting.  Reports constipation and states that she has not had a bowel movement in  several days.  No bleeding reported. Medical Oncology was asked to see the patient to make recommendations regarding her metastatic lung cancer and worsening brain mets.   Past Medical History:  Diagnosis Date   Diabetes ([place])    Does not check bs at home   Dyspnea    High cholesterol    Lung cancer ([place])    04/2020  :   Past Surgical History:  Procedure Laterality Date   ABDOMINAL HYSTERECTOMY     BREAST BIOPSY Right    BRONCHIAL BIOPSY  04/30/2020   Procedure: BRONCHIAL BIOPSIES;  Surgeon: Collene Gobble, MD;  Location: [place];  Service: Cardiopulmonary;;   BRONCHIAL BRUSHINGS  04/30/2020   Procedure: BRONCHIAL BRUSHINGS;  Surgeon: Collene Gobble, MD;  Location: [place];  Service: Cardiopulmonary;;   BRONCHIAL BRUSHINGS  01/20/2021   Procedure: BRONCHIAL BRUSHINGS;  Surgeon: Laurin Coder, MD;  Location: [facility];  Service: Endoscopy;;   BRONCHIAL WASHINGS  01/20/2021   Procedure: BRONCHIAL WASHINGS;  Surgeon: Laurin Coder, MD;  Location: [facility];  Service: Endoscopy;;   CHEST TUBE INSERTION Left 05/28/2021   Procedure: INSERTION PLEURAL DRAINAGE CATHETER;  Surgeon: [name], DO;  Location: [place];  Service: Pulmonary;  Laterality: Left;  Prior to bronchoscopy   HEMOSTASIS CONTROL  04/30/2020   Procedure: HEMOSTASIS CONTROL;  Surgeon: Collene Gobble, MD;  Location: [place];  Service: Cardiopulmonary;;   IR THORACENTESIS ASP PLEURAL SPACE W/IMG GUIDE  04/15/2021   IR THORACENTESIS ASP PLEURAL SPACE W/IMG GUIDE  05/03/2021   IR THORACENTESIS ASP PLEURAL SPACE W/IMG GUIDE  05/11/2021   TUBAL LIGATION     VIDEO BRONCHOSCOPY N/A 04/30/2020   Procedure: VIDEO BRONCHOSCOPY WITH FLUORO;  Surgeon: Collene Gobble, MD;  Location:  [place] [facility];  Service: Cardiopulmonary;  Laterality: N/A;   VIDEO BRONCHOSCOPY N/A 01/20/2021   Procedure: VIDEO BRONCHOSCOPY WITHOUT FLUORO;  Surgeon: Laurin Coder, MD;  Location: [facility];   Service: Endoscopy;  Laterality: N/A;   VIDEO BRONCHOSCOPY Left 05/28/2021   Procedure: VIDEO BRONCHOSCOPY WITHOUT FLUORO;  Surgeon: [name], DO;  Location: [place];  Service: Pulmonary;  Laterality: Left;  :   Current Facility-Administered Medications  Medication Dose Route Frequency Provider Last Rate Last Admin   0.9 %  sodium chloride infusion   Intravenous Continuous Marcelyn Bruins, MD 75 mL/hr at 06/24/21 0501 Rate Verify at 06/24/21 0501   acetaminophen (TYLENOL) tablet 650 mg  650 mg Oral Q6H PRN Marcelyn Bruins, MD       Or   acetaminophen (TYLENOL) suppository 650 mg  650 mg Rectal Q6H PRN Marcelyn Bruins, MD       dexamethasone (DECADRON) injection 2 mg  2 mg Intravenous Q6H Marcelyn Bruins, MD   2 mg at 06/24/21 0639   enoxaparin (LOVENOX) injection 30 mg  30 mg Subcutaneous Q24H Marcelyn Bruins, MD   30 mg at 06/23/21 2230   insulin aspart (novoLOG) injection 0-9 Units  0-9 Units Subcutaneous TID WC Marcelyn Bruins, MD       lacosamide (VIMPAT) tablet 50 mg  50 mg Oral BID Marcelyn Bruins, MD       letrozole [facility]) tablet 2.5 mg  2.5 mg Oral Daily Marcelyn Bruins, MD       polyethylene glycol (MIRALAX / GLYCOLAX) packet 17 g  17 g Oral Daily PRN Marcelyn Bruins, MD       sodium chloride flush (NS) 0.9 % injection 3 mL  3 mL Intravenous Q12H Marcelyn Bruins, MD       thyroid (ARMOUR) tablet 60 mg  60 mg Oral Q0600 Marcelyn Bruins, MD   60 mg at 06/24/21 1610   Current Outpatient Medications  Medication Sig Dispense Refill   AMBULATORY NON FORMULARY MEDICATION 2 capsules 2 (two) times daily. Medication Name: [place] tell mushroom supplement     Black Pepper-Turmeric (TURMERIC PLUS BLACK PEPPER EXT PO) Take 1 capsule by mouth in the morning, at noon, and at bedtime.     Cholecalciferol (VITAMIN D) 125 MCG (5000 UT) CAPS Take 10,000 Units by mouth every other day.     Coenzyme Q10 (CO Q 10 PO) Take 300 mg by mouth at  bedtime.     dabrafenib mesylate (TAFINLAR) 75 MG capsule Take 2 capsules (150 mg total) by mouth 2 (two) times daily. Take on an empty stomach 1 hour before or 2 hours after meals. 120 capsule 3   DANDELION PO Take 1 tablet by mouth daily with supper.     Flavoring Agent (APRICOT FLAVOR) POWD Take 2 capsules by mouth daily. Apricot Seed Supplement     ibuprofen (ADVIL) 200 MG tablet Take 400 mg by mouth every 6 (six) hours as needed for mild pain.     IODINE, KELP, PO Take 1 capsule by mouth in the morning.     IVERMECTIN PO Take 2 mLs by mouth in the morning. Ivermectin oil - 10 mg/ml     letrozole (FEMARA) 2.5 MG tablet Take 1 tablet (2.5 mg total) by mouth daily. 30 tablet 2   metFORMIN (GLUCOPHAGE) 500 MG tablet Take 1 tablet (500 mg total) by mouth 3 (three) times daily. 90 tablet 5   NP THYROID 60 MG tablet  Take 60 mg by mouth daily.     Prenatal Vit-Fe Fumarate-FA (MULTIVITAMIN-PRENATAL) 27-0.8 MG TABS tablet Take 1 tablet by mouth daily at 12 noon.     trametinib dimethyl sulfoxide (MEKINIST) 2 MG tablet Take 1 tablet (2 mg total) by mouth daily. Take 1 hour before or 2 hours after a meal. Store refrigerated in original container. 30 tablet 3   Zinc 100 MG TABS Take 100 mg by mouth daily.     polyethylene glycol (MIRALAX / GLYCOLAX) 17 g packet Take 17 g by mouth daily. (Patient not taking: Reported on 06/23/2021) 14 each 0     No Known Allergies:   Family History  Problem Relation Age of Onset   Breast cancer Sister        half sister on maternal side   Colon cancer Neg Hx    Esophageal cancer Neg Hx   :   Social History   Socioeconomic History   Marital status: Married    Spouse name: Not on file   Number of children: Not on file   Years of education: Not on file   Highest education level: Not on file  Occupational History   Not on file  Tobacco Use   Smoking status: Never   Smokeless tobacco: Never  Vaping Use   Vaping Use: Never used  Substance and Sexual  Activity   Alcohol use: Not Currently   Drug use: Never   Sexual activity: Not on file  Other Topics Concern   Not on file  Social History Narrative   Not on file   Social Determinants of Health   Financial Resource Strain: Not on file  Food Insecurity: Not on file  Transportation Needs: Not on file  Physical Activity: Not on file  Stress: Not on file  Social Connections: Not on file  Intimate Partner Violence: Not on file  :  Review of Systems: A comprehensive 14 point review of systems was negative except as noted in the HPI.  Exam: Patient Vitals for the past 24 hrs:  BP Temp Temp src Pulse Resp SpO2 Height Weight  06/24/21 0730 118/76 -- -- 92 (!) 27 97 % -- --  06/24/21 0640 -- -- -- 91 (!) 28 100 % -- --  06/24/21 0639 116/77 -- -- 91 (!) 28 100 % -- --  06/24/21 0300 116/74 -- -- 95 (!) 29 96 % -- --  06/24/21 0100 105/73 -- -- 97 (!) 25 95 % -- --  06/23/21 2222 -- -- -- (!) 104 (!) 28 95 % -- --  06/23/21 2200 110/72 -- -- (!) 106 (!) 35 96 % -- --  06/23/21 2100 124/82 -- -- (!) 107 -- 96 % -- --  06/23/21 2051 125/85 -- -- (!) 109 (!) 22 97 % -- --  06/23/21 1903 -- -- -- (!) 105 (!) 26 97 % -- --  06/23/21 1900 117/71 -- -- (!) 105 (!) 34 97 % -- --  06/23/21 1800 107/73 -- -- (!) 102 20 100 % -- --  06/23/21 1726 118/73 -- -- (!) 104 (!) 28 100 % -- --  06/23/21 1558 -- -- -- -- -- -- 5' 2"  (1.575 m) 49.9 kg  06/23/21 1556 117/73 98 F (36.7 C) Oral (!) 107 16 99 % -- --    General: Awake and alert, no distress. Eyes:  no scleral icterus.   ENT:  There were no oropharyngeal lesions.   Neck was without thyromegaly.   Lymphatics:  Negative cervical, supraclavicular or axillary adenopathy.   Respiratory: lungs were clear bilaterally without wheezing or crackles.   Cardiovascular:  Regular rate and rhythm, S1/S2, without murmur, rub or gallop.  There was no pedal edema.   GI:  abdomen was soft, flat, nontender, nondistended, without organomegaly.    Musculoskeletal: Strength 4/5 in the bilateral lower extremities. Skin exam was without echymosis, petichae.   Neuro exam was nonfocal. Patient was alert and oriented.  Attention was good.   Language was appropriate.    Lab Results  Component Value Date   WBC 9.0 06/24/2021   HGB 8.1 (L) 06/24/2021   HCT 26.1 (L) 06/24/2021   PLT 656 (H) 06/24/2021   GLUCOSE 104 (H) 06/24/2021   ALT 10 06/24/2021   AST 11 (L) 06/24/2021   NA 130 (L) 06/24/2021   K 4.4 06/24/2021   CL 95 (L) 06/24/2021   CREATININE 0.51 06/24/2021   BUN 14 06/24/2021   CO2 26 06/24/2021    DG Chest 2 View  Result Date: 06/23/2021 CLINICAL DATA:  New onset leg weakness and difficulty walking. EXAM: CHEST - 2 VIEW COMPARISON:  Chest x-ray dated May 28, 2021. FINDINGS: Unchanged left-sided PleurX catheter. Stable appearance of the chest with large loculated left pleural effusion and near complete collapse of the left lung. Large left lower lobe mass is obscured. The right lung remains clear. Stable cardiomediastinal silhouette with rightward mediastinal shift. No pneumothorax. No acute osseous abnormality. IMPRESSION: 1. Stable large loculated left pleural effusion and near complete collapse of the left lung. Electronically Signed   By: Titus Dubin M.D.   On: 06/23/2021 16:20   CT HEAD WO CONTRAST (5MM)  Result Date: 06/23/2021 CLINICAL DATA:  Weakness.  History of metastatic lung cancer. EXAM: CT HEAD WITHOUT CONTRAST TECHNIQUE: Contiguous axial images were obtained from the base of the skull through the vertex without intravenous contrast. RADIATION DOSE REDUCTION: This exam was performed according to the departmental dose-optimization program which includes automated exposure control, adjustment of the mA and/or kV according to patient size and/or use of iterative reconstruction technique. COMPARISON:  01/28/2021 and brain MR 05/21/2021 FINDINGS: Brain: Again noted is vasogenic edema in the left parietal lobe and  this may have progressed since the MRI on 05/21/2021. There is new vasogenic edema in the right parietooccipital region and concerning for a new underlying lesion. There is also new low-density in the right frontal lobe on series 2, image 12. No evidence for acute hemorrhage. Ventricles are stable and there is no significant midline shift. Vascular: No hyperdense vessel or unexpected calcification. Skull: Normal. Negative for fracture or focal lesion. Sinuses/Orbits: Visualized sinuses are clear. Other: None IMPRESSION: 1. Patient has known intracranial metastatic disease and there are new areas of vasogenic edema in the right frontal lobe and right parietal-occipital region. In addition, the edema in the left parietal lobe may have increased. Findings are suggestive for progression of intracranial metastatic disease. This could be better evaluated with MRI. 2. No evidence for acute hemorrhage. Electronically Signed   By: Markus Daft M.D.   On: 06/23/2021 16:31   MR Brain W and Wo Contrast  Result Date: 06/23/2021 CLINICAL DATA:  Initial evaluation for CNS neoplasm, assess treatment response. EXAM: MRI HEAD WITHOUT AND WITH CONTRAST TECHNIQUE: Multiplanar, multiecho pulse sequences of the brain and surrounding structures were obtained without and with intravenous contrast. CONTRAST:  5.77m GADAVIST GADOBUTROL 1 MMOL/ML IV SOLN COMPARISON:  Head CT from earlier the same day as well as previous  brain MRI from 05/21/2021. FINDINGS: Brain: Cerebral volume stable, and remains within normal limits. Underlying mild chronic microvascular ischemic disease noted, also unchanged. No evidence for acute or subacute infarct. No acute or chronic intracranial hemorrhage. No extra-axial collection. No intracranial metastatic disease again seen. Overall, appearance is slightly worsened and progressed from previous, with interval enlargement of multiple lesions as compared to previous. For example, a lesion positioned at the  gray-white matter differentiation of the left parietal region now measures up to 11 mm, previously 9 mm (series 16, image 116). A lesion slightly anteriorly within the posterior left frontal region measures up to 10 mm, previously 7 mm (series 16, image 16). On the right, a lesion at the anterior right frontal lobe now measures 6 mm, previously 5 mm (series 16, image 76). A dural based lesion at the right occipital lobe measures 8 mm, previously 5 mm (series 16, image 78). Additionally, several new punctate lesions are seen at the posterior left frontotemporal region, not definitely seen on prior (series 16, images 90, 81). Surrounding vasogenic edema seen about the larger lesions, mildly progressed, and most pronounced at the left parietal lobe. Mild mass effect on the adjacent left lateral ventricle with trace left-to-right shift. No hydrocephalus or trapping. Basilar cisterns remain patent. Vascular: Major intracranial vascular flow voids are maintained. Skull and upper cervical spine: Craniocervical junction within normal limits. Decreased T1 signal intensity seen within the visualized bone marrow without focal marrow replacing lesion. No scalp soft tissue abnormality. Sinuses/Orbits: Right gaze noted. Globes and orbital soft tissues demonstrate no other acute finding. Paranasal sinuses are largely clear. No mastoid effusion. Inner ear structures grossly normal. Other: None. IMPRESSION: 1. Interval enlargement of multiple metastatic lesions involving the bilateral cerebral hemispheres, with a few new punctate lesions as above, consistent with interval progression of disease. Associated vasogenic edema about the larger lesions, also mildly progressed. Associated trace left-to-right shift without hydrocephalus or trapping. 2. No other acute intracranial abnormality. Electronically Signed   By: Jeannine Boga M.D.   On: 06/23/2021 22:16   DG CHEST PORT 1 VIEW  Result Date: 05/28/2021 CLINICAL DATA:   Chest tube, history lung cancer, multiple prior thoracentesis procedures EXAM: PORTABLE CHEST 1 VIEW COMPARISON:  Portable exam 0856 hours compared to 05/11/2021 FINDINGS: Complete opacification of the LEFT hemithorax by large pleural effusion and accompanying LEFT lung atelectasis/consolidation; known LEFT lung mass obscured. LEFT PleurX catheter identified at inferior LEFT hemithorax. Mediastinal shift to the RIGHT. RIGHT lung otherwise clear. Skin folds project over LEFT chest without pneumothorax. Bones demineralized. IMPRESSION: Complete opacification of the LEFT hemithorax by LEFT pleural effusion, known LEFT lung mass and LEFT lung atelectasis/consolidation. Electronically Signed   By: Lavonia Dana M.D.   On: 05/28/2021 09:09     DG Chest 2 View  Result Date: 06/23/2021 CLINICAL DATA:  New onset leg weakness and difficulty walking. EXAM: CHEST - 2 VIEW COMPARISON:  Chest x-ray dated May 28, 2021. FINDINGS: Unchanged left-sided PleurX catheter. Stable appearance of the chest with large loculated left pleural effusion and near complete collapse of the left lung. Large left lower lobe mass is obscured. The right lung remains clear. Stable cardiomediastinal silhouette with rightward mediastinal shift. No pneumothorax. No acute osseous abnormality. IMPRESSION: 1. Stable large loculated left pleural effusion and near complete collapse of the left lung. Electronically Signed   By: Titus Dubin M.D.   On: 06/23/2021 16:20   CT HEAD WO CONTRAST (5MM)  Result Date: 06/23/2021 CLINICAL DATA:  Weakness.  History  of metastatic lung cancer. EXAM: CT HEAD WITHOUT CONTRAST TECHNIQUE: Contiguous axial images were obtained from the base of the skull through the vertex without intravenous contrast. RADIATION DOSE REDUCTION: This exam was performed according to the departmental dose-optimization program which includes automated exposure control, adjustment of the mA and/or kV according to patient size and/or use  of iterative reconstruction technique. COMPARISON:  01/28/2021 and brain MR 05/21/2021 FINDINGS: Brain: Again noted is vasogenic edema in the left parietal lobe and this may have progressed since the MRI on 05/21/2021. There is new vasogenic edema in the right parietooccipital region and concerning for a new underlying lesion. There is also new low-density in the right frontal lobe on series 2, image 12. No evidence for acute hemorrhage. Ventricles are stable and there is no significant midline shift. Vascular: No hyperdense vessel or unexpected calcification. Skull: Normal. Negative for fracture or focal lesion. Sinuses/Orbits: Visualized sinuses are clear. Other: None IMPRESSION: 1. Patient has known intracranial metastatic disease and there are new areas of vasogenic edema in the right frontal lobe and right parietal-occipital region. In addition, the edema in the left parietal lobe may have increased. Findings are suggestive for progression of intracranial metastatic disease. This could be better evaluated with MRI. 2. No evidence for acute hemorrhage. Electronically Signed   By: Markus Daft M.D.   On: 06/23/2021 16:31   MR Brain W and Wo Contrast  Result Date: 06/23/2021 CLINICAL DATA:  Initial evaluation for CNS neoplasm, assess treatment response. EXAM: MRI HEAD WITHOUT AND WITH CONTRAST TECHNIQUE: Multiplanar, multiecho pulse sequences of the brain and surrounding structures were obtained without and with intravenous contrast. CONTRAST:  5.8m GADAVIST GADOBUTROL 1 MMOL/ML IV SOLN COMPARISON:  Head CT from earlier the same day as well as previous brain MRI from 05/21/2021. FINDINGS: Brain: Cerebral volume stable, and remains within normal limits. Underlying mild chronic microvascular ischemic disease noted, also unchanged. No evidence for acute or subacute infarct. No acute or chronic intracranial hemorrhage. No extra-axial collection. No intracranial metastatic disease again seen. Overall, appearance is  slightly worsened and progressed from previous, with interval enlargement of multiple lesions as compared to previous. For example, a lesion positioned at the gray-white matter differentiation of the left parietal region now measures up to 11 mm, previously 9 mm (series 16, image 116). A lesion slightly anteriorly within the posterior left frontal region measures up to 10 mm, previously 7 mm (series 16, image 16). On the right, a lesion at the anterior right frontal lobe now measures 6 mm, previously 5 mm (series 16, image 76). A dural based lesion at the right occipital lobe measures 8 mm, previously 5 mm (series 16, image 78). Additionally, several new punctate lesions are seen at the posterior left frontotemporal region, not definitely seen on prior (series 16, images 90, 81). Surrounding vasogenic edema seen about the larger lesions, mildly progressed, and most pronounced at the left parietal lobe. Mild mass effect on the adjacent left lateral ventricle with trace left-to-right shift. No hydrocephalus or trapping. Basilar cisterns remain patent. Vascular: Major intracranial vascular flow voids are maintained. Skull and upper cervical spine: Craniocervical junction within normal limits. Decreased T1 signal intensity seen within the visualized bone marrow without focal marrow replacing lesion. No scalp soft tissue abnormality. Sinuses/Orbits: Right gaze noted. Globes and orbital soft tissues demonstrate no other acute finding. Paranasal sinuses are largely clear. No mastoid effusion. Inner ear structures grossly normal. Other: None. IMPRESSION: 1. Interval enlargement of multiple metastatic lesions involving the bilateral cerebral  hemispheres, with a few new punctate lesions as above, consistent with interval progression of disease. Associated vasogenic edema about the larger lesions, also mildly progressed. Associated trace left-to-right shift without hydrocephalus or trapping. 2. No other acute intracranial  abnormality. Electronically Signed   By: Jeannine Boga M.D.   On: 06/23/2021 22:16   DG CHEST PORT 1 VIEW  Result Date: 05/28/2021 CLINICAL DATA:  Chest tube, history lung cancer, multiple prior thoracentesis procedures EXAM: PORTABLE CHEST 1 VIEW COMPARISON:  Portable exam 0856 hours compared to 05/11/2021 FINDINGS: Complete opacification of the LEFT hemithorax by large pleural effusion and accompanying LEFT lung atelectasis/consolidation; known LEFT lung mass obscured. LEFT PleurX catheter identified at inferior LEFT hemithorax. Mediastinal shift to the RIGHT. RIGHT lung otherwise clear. Skin folds project over LEFT chest without pneumothorax. Bones demineralized. IMPRESSION: Complete opacification of the LEFT hemithorax by LEFT pleural effusion, known LEFT lung mass and LEFT lung atelectasis/consolidation. Electronically Signed   By: Lavonia Dana M.D.   On: 05/28/2021 09:09    Assessment and Plan:  1.  Metastatic lung cancer 2.  Worsening brain metastasis with vasogenic edema 3.  History of breast cancer 4.  Hypothyroidism 5.  Diabetes mellitus  -Imaging results were discussed with the patient today.  We discussed evidence of worsening brain metastasis with edema.  Continue dexamethasone and antiseizure medications. -She has received stereotactic radiation to her brain mets in the past.  Patient has expressed that she is unsure if she wants to proceed with any additional systemic therapy such as immunotherapy and unsure if she would consider radiation if offered.  However, she is willing to talk to radiation to see what options are available to her.  Will discuss with Dr. Marin Stone about referral to radiation oncology this admission. -Overall, the patient has told me that she does not want to suffer and does not want to have her family see her suffer.  We briefly had a discussion regarding continuing disease modifying therapy versus transitioning to comfort measures.  At this time, she is not  sure what she would like to do.  She is a DNR/DNI.  Tami Bussing, DNP, AGPCNP-BC, AOCNP    ADDENDUM: I agree with the above by Tami Stone.  Ms. Olejnicak has progression of brain mets.  This is confirmed by the MRI.  She needs to be seen by Radiation Oncology.  I wonder if they can do more radiosurgery for the brain mets.  She does not want whole brain radiation.  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  Again, the only way to help with this is radiation.  She has not wish to have radiation therapy.  She does not wish to have chemotherapy.  We were supposed to start her on immunotherapy but again, she has declined this.  She wants to use holistic approach.  She recently received a naturopathic agent- DCA.  She thinks this will be good for her cancer.  Nutrition is going be key.  I think if she needs to have a regular diet.  I think a dietary consult would not be a bad idea to try to assess her nutritional state.  We will check her prealbumin.  I think she probably can come off the cardiac monitor.  She is a DNR.  She has a large loculated left pleural effusion.  She has a Pleurx catheter in.  She does not seem to have any respiratory distress.  She is quite anemic.  I am not sure if she were  to agree to a blood transfusion.  I will have to talk to her about this tomorrow.  Again, we need Radiation Oncology to see her to see if they would agree with more radiosurgery.  I realize that she is a DNR.  I certainly do not have any problems with this.  Again, she has been very focused on what is best for her.  She just does not feel that standard therapies for metastatic lung cancer are appropriate for her.  Again she wishes to approach this with a holistic regimen.  I do think the steroids for the brain are helping.  She is on antiseizure medications.  I do appreciate everybody's help with her.  I know she is incredibly nice.  She just feels that her  body is not going to be able to handle standard approaches.  Tami Haw, MD  Psalms 121:7

## 2021-06-25 ENCOUNTER — Other Ambulatory Visit: Payer: Self-pay

## 2021-06-25 ENCOUNTER — Ambulatory Visit
Admit: 2021-06-25 | Discharge: 2021-06-25 | Disposition: A | Discharge status: Home / Self Care | Payer: Medicare Other | Attending: Radiation Oncology | Admitting: Radiation Oncology

## 2021-06-25 DIAGNOSIS — C7931 Secondary malignant neoplasm of brain: Principal | ICD-10-CM

## 2021-06-25 DIAGNOSIS — G936 Cerebral edema: Secondary | ICD-10-CM

## 2021-06-25 DIAGNOSIS — C3492 Malignant neoplasm of unspecified part of left bronchus or lung: Secondary | ICD-10-CM

## 2021-06-25 DIAGNOSIS — D75839 Thrombocytosis, unspecified: Secondary | ICD-10-CM

## 2021-06-25 LAB — IRON AND TIBC
Iron: 15 ug/dL — ABNORMAL LOW (ref 28–170)
Saturation Ratios: 10 % — ABNORMAL LOW (ref 10.4–31.8)
TIBC: 146 ug/dL — ABNORMAL LOW (ref 250–450)
UIBC: 131 ug/dL

## 2021-06-25 LAB — RENAL FUNCTION PANEL
Albumin: 2.2 g/dL — ABNORMAL LOW (ref 3.5–5.0)
Anion gap: 14 (ref 5–15)
BUN: 12 mg/dL (ref 8–23)
CO2: 26 mmol/L (ref 22–32)
Calcium: 8.4 mg/dL — ABNORMAL LOW (ref 8.9–10.3)
Chloride: 94 mmol/L — ABNORMAL LOW (ref 98–111)
Creatinine, Ser: <0.3 mg/dL — ABNORMAL LOW (ref 0.44–1.00)
Glucose, Bld: 163 mg/dL — ABNORMAL HIGH (ref 70–99)
Phosphorus: 1.9 mg/dL — ABNORMAL LOW (ref 2.5–4.6)
Potassium: 3.9 mmol/L (ref 3.5–5.1)
Sodium: 134 mmol/L — ABNORMAL LOW (ref 135–145)

## 2021-06-25 LAB — HEMOGLOBIN A1C
Hgb A1c MFr Bld: 5.4 % (ref 4.8–5.6)
Mean Plasma Glucose: 108.28 mg/dL

## 2021-06-25 LAB — GLUCOSE, CAPILLARY
Glucose-Capillary: 129 mg/dL — ABNORMAL HIGH (ref 70–99)
Glucose-Capillary: 143 mg/dL — ABNORMAL HIGH (ref 70–99)
Glucose-Capillary: 163 mg/dL — ABNORMAL HIGH (ref 70–99)
Glucose-Capillary: 163 mg/dL — ABNORMAL HIGH (ref 70–99)

## 2021-06-25 LAB — CBC
HCT: 27.6 % — ABNORMAL LOW (ref 36.0–46.0)
Hemoglobin: 8.3 g/dL — ABNORMAL LOW (ref 12.0–15.0)
MCH: 25.8 pg — ABNORMAL LOW (ref 26.0–34.0)
MCHC: 30.1 g/dL (ref 30.0–36.0)
MCV: 85.7 fL (ref 80.0–100.0)
Platelets: 743 10*3/uL — ABNORMAL HIGH (ref 150–400)
RBC: 3.22 MIL/uL — ABNORMAL LOW (ref 3.87–5.11)
RDW: 17.5 % — ABNORMAL HIGH (ref 11.5–15.5)
WBC: 12.7 10*3/uL — ABNORMAL HIGH (ref 4.0–10.5)
nRBC: 0 % (ref 0.0–0.2)

## 2021-06-25 LAB — MAGNESIUM: Magnesium: 1.9 mg/dL (ref 1.7–2.4)

## 2021-06-25 LAB — ABO/RH: ABO/RH(D): O POS

## 2021-06-25 MED ORDER — ENSURE ENLIVE PO LIQD
237.0000 mL | Freq: Two times a day (BID) | ORAL | Status: DC
  Administered 2021-06-26: 237 mL via ORAL

## 2021-06-25 MED ORDER — ADULT MULTIVITAMIN W/MINERALS CH
1.0000 | ORAL_TABLET | Freq: Every day | ORAL | Status: DC
  Administered 2021-06-25 – 2021-06-26 (×2): 1 via ORAL
  Filled 2021-06-25 (×2): qty 1

## 2021-06-25 MED ORDER — POTASSIUM PHOSPHATES 15 MMOLE/5ML IV SOLN
30.0000 mmol | Freq: Once | INTRAVENOUS | Status: AC
  Administered 2021-06-25: 30 mmol via INTRAVENOUS
  Filled 2021-06-25: qty 10

## 2021-06-25 NOTE — Progress Notes (Signed)
Initial Nutrition Assessment  DOCUMENTATION CODES:   Severe malnutrition in context of chronic illness  INTERVENTION:   -Ensure Enlive po BID, each supplement provides 350 kcal and 20 grams of protein  -Multivitamin with minerals daily  -Double protein portions with meals  NUTRITION DIAGNOSIS:   Severe Malnutrition related to chronic illness, cancer and cancer related treatments as evidenced by severe fat depletion, severe muscle depletion, percent weight loss.  GOAL:   Patient will meet greater than or equal to 90% of their needs  MONITOR:   PO intake, Supplement acceptance, Labs, Weight trends, I & O's  REASON FOR ASSESSMENT:   Consult Other (Comment)  ASSESSMENT:   71 year old F with PMH of metastatic lung cancer, history of breast cancer, chronic large left-sided pleural effusion, DM-2, anemia, HLD, hypothyroidism, neuropathy and syncope presenting with difficulty walking, dizziness, worsening neuropathic pain, head twitch and right body tightness, and admitted with worsening vasogenic cerebral edema due to worsening metastatic brain lesions as noted on MRI brain.  RD received consult for assessment that states "Is there any way we can make sure that she can have as much as possible to eat??.  She is hungry.  She just does not want to be limited if possible."  Addressed this with patient. She states she was on a heart healthy and the choices were too limited or food options she doesn't consider healthy. MD changed her diet to regular this morning. Pt ate 75% of cream of wheat and eggs. Pt has some trouble with getting food down her esophagus. Reports this isn't a swallowing issue, more that her cancer pushes against her esophagus so food feels like it gets caught. Advised pt to make sure she is chewing her food thoroughly before swallowing.   Pt states she drinks Ensure in the morning typically at home and she had unflavored protein powder she adds to soups. Will order  Ensure.  Per weight records, pt has lost 11 lbs since 06/02/21 (9% wt loss x <1 month, significant for time frame).  Reports >1 year ago she weighed around 167 lbs.  Medications: colace, K-Phos  Labs reviewed:  CBGs: 129-179 Low Na, Mg Low iron  NUTRITION - FOCUSED PHYSICAL EXAM:  Flowsheet Row Most Recent Value  Orbital Region Moderate depletion  Upper Arm Region Severe depletion  Thoracic and Lumbar Region Unable to assess  Buccal Region Severe depletion  Temple Region Severe depletion  Clavicle Bone Region Severe depletion  Clavicle and Acromion Bone Region Severe depletion  Scapular Bone Region Severe depletion  Dorsal Hand Severe depletion  Patellar Region Severe depletion  Anterior Thigh Region Severe depletion  Posterior Calf Region Severe depletion  Edema (RD Assessment) None  Hair Reviewed  Skin Reviewed       Diet Order:   Diet Order             Diet regular Room service appropriate? Yes; Fluid consistency: Thin  Diet effective now                   EDUCATION NEEDS:   No education needs have been identified at this time  Skin:  Skin Assessment: Reviewed RN Assessment  Last BM:  1/9  Height:   Ht Readings from Last 1 Encounters:  06/23/21 5\' 2"  (1.575 m)    Weight:   Wt Readings from Last 1 Encounters:  06/23/21 49.9 kg    Ideal Body Weight:     BMI:  Body mass index is 20.12 kg/m.  Estimated Nutritional  Needs:   Kcal:  1750-1950  Protein:  90-105g  Fluid:  1.8L/day  Clayton Bibles, MS, RD, LDN Inpatient Clinical Dietitian Contact information available via Amion

## 2021-06-25 NOTE — Consult Note (Signed)
Radiation Oncology         [phone]) [phone] ________________________________  Initial inpatient Consultation  Name: Tami Stone MRN: [id number]  Date of Service: 06/23/2021 DOB: 1951/01/15  CC:Tami Cage, MD  No ref. provider found   REFERRING PHYSICIAN: No ref. provider found  DIAGNOSIS: 71 y.o. 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    ICD-10-CM   1. Vasogenic edema (HCC)  G93.6 docusate sodium (COLACE) capsule 100 mg    2. Brain metastases (HCC)  C79.31 0.9 %  sodium chloride infusion    3. Partial seizure ([place])  R56.9     4. Weakness  R53.1     5. Protein-calorie malnutrition, severe  E43 0.9 %  sodium chloride infusion    6. Pleural effusion on left  J90 0.9 %  sodium chloride infusion    7. Malignant neoplasm of lower-outer quadrant of left breast of female, estrogen receptor positive (HCC)  C50.512 0.9 %  sodium chloride infusion   Z17.0       HISTORY OF PRESENT ILLNESS: Tami Stone is a 71 y.o. female seen at the request of Dr. Cyndia Skeeters.  She is well-known to our service, having previously completed stereotactic radiation to metastatic brain disease in September 2022.  She was recently admitted to the hospital on 06/23/2021 with a 1 week history of progressive difficulty walking, and dizziness.  On 06/23/2021, she had an episode of head twitching and tightness in the right side of her body which prompted her ED visit.  Imaging on admission shows continued disease progression systemically as well as within the brain.  A follow-up brain MRI scan from 05/21/2021 had shown progression of metastatic disease in the brain with numerous small lesions and we had discussed the recommendation for whole brain radiation at that time but the patient was not yet decided regarding whether she would proceed.  Her most recent  MRI brain from June 23, 2021 shows continued progressive disease in the brain with enlargement of some of the previously noted lesions as well as several new lesions.  She has been started on dexamethasone and Vimpat but has not really appreciated much if any improvement in the lower extremity weakness.  She denies any nausea, vomiting, difficulty with speech or headaches.  She has not had any further head twitching since admission.  She continues to be undecided regarding whether she wants to proceed with any additional systemic therapy or radiation and instead is interested in taking a more holistic approach to her treatment with her integrative medicine physician.  We have been asked to consult today for further discussion regarding recommendations for radiotherapy in the management of her progressive brain metastases.  PREVIOUS RADIATION THERAPY: Yes   02/11/2021 through 02/11/2021//SRS brain Site Technique Total Dose (Gy) Dose per Fx (Gy) Completed Fx Beam Energies  Brain:    PTV_1_LtCereb63m PTV_2_RtCereb350m PTV_3_RtCereb4m85mTV_4_LtFront6mm30mV_5_RtFront5mm 46m_6_LtPariet4mm P85m7_LtFront4mm  P71m8_RtFront5mm  IM40m20/20 20 1/1 6XFFF      PAST MEDICAL HISTORY:  Past Medical History:  Diagnosis Date   Diabetes (HCC)    [place] not check bs at home   Dyspnea    High cholesterol    Lung cancer (HCC)    [place]2021      PAST SURGICAL HISTORY: Past Surgical History:  Procedure Laterality Date   ABDOMINAL HYSTERECTOMY     BREAST BIOPSY Right    BRONCHIAL  BIOPSY  04/30/2020   Procedure: BRONCHIAL BIOPSIES;  Surgeon: Collene Gobble, MD;  Location: [place];  Service: Cardiopulmonary;;   BRONCHIAL BRUSHINGS  04/30/2020   Procedure: BRONCHIAL BRUSHINGS;  Surgeon: Collene Gobble, MD;  Location: [place];  Service: Cardiopulmonary;;   BRONCHIAL BRUSHINGS  01/20/2021   Procedure: BRONCHIAL BRUSHINGS;  Surgeon: Laurin Coder, MD;  Location: [facility];  Service:  Endoscopy;;   BRONCHIAL WASHINGS  01/20/2021   Procedure: BRONCHIAL WASHINGS;  Surgeon: Laurin Coder, MD;  Location: [facility];  Service: Endoscopy;;   CHEST TUBE INSERTION Left 05/28/2021   Procedure: INSERTION PLEURAL DRAINAGE CATHETER;  Surgeon: [name], DO;  Location: [place];  Service: Pulmonary;  Laterality: Left;  Prior to bronchoscopy   HEMOSTASIS CONTROL  04/30/2020   Procedure: HEMOSTASIS CONTROL;  Surgeon: Collene Gobble, MD;  Location: [place];  Service: Cardiopulmonary;;   IR THORACENTESIS ASP PLEURAL SPACE W/IMG GUIDE  04/15/2021   IR THORACENTESIS ASP PLEURAL SPACE W/IMG GUIDE  05/03/2021   IR THORACENTESIS ASP PLEURAL SPACE W/IMG GUIDE  05/11/2021   TUBAL LIGATION     VIDEO BRONCHOSCOPY N/A 04/30/2020   Procedure: VIDEO BRONCHOSCOPY WITH FLUORO;  Surgeon: Collene Gobble, MD;  Location: [place];  Service: Cardiopulmonary;  Laterality: N/A;   VIDEO BRONCHOSCOPY N/A 01/20/2021   Procedure: VIDEO BRONCHOSCOPY WITHOUT FLUORO;  Surgeon: Laurin Coder, MD;  Location: [facility];  Service: Endoscopy;  Laterality: N/A;   VIDEO BRONCHOSCOPY Left 05/28/2021   Procedure: VIDEO BRONCHOSCOPY WITHOUT FLUORO;  Surgeon: [name], DO;  Location: [place];  Service: Pulmonary;  Laterality: Left;    FAMILY HISTORY:  Family History  Problem Relation Age of Onset   Breast cancer Sister        half sister on maternal side   Colon cancer Neg Hx    Esophageal cancer Neg Hx     SOCIAL HISTORY:  Social History   Socioeconomic History   Marital status: Married    Spouse name: Not on file   Number of children: Not on file   Years of education: Not on file   Highest education level: Not on file  Occupational History   Not on file  Tobacco Use   Smoking status: Never   Smokeless tobacco: Never  Vaping Use   Vaping Use: Never used  Substance and Sexual Activity   Alcohol use: Not Currently   Drug use: Never   Sexual activity: Not on  file  Other Topics Concern   Not on file  Social History Narrative   Not on file   Social Determinants of Health   Financial Resource Strain: Not on file  Food Insecurity: Not on file  Transportation Needs: Not on file  Physical Activity: Not on file  Stress: Not on file  Social Connections: Not on file  Intimate Partner Violence: Not on file    ALLERGIES: Patient has no known allergies.  MEDICATIONS:  Current Facility-Administered Medications  Medication Dose Route Frequency Provider Last Rate Last Admin   0.9 %  sodium chloride infusion   Intravenous Continuous Volanda Napoleon, MD 50 mL/hr at 06/25/21 0830 Rate Change at 06/25/21 0830   acetaminophen (TYLENOL) tablet 650 mg  650 mg Oral Q6H PRN Marcelyn Bruins, MD       Or   acetaminophen (TYLENOL) suppository 650 mg  650 mg Rectal Q6H PRN Marcelyn Bruins, MD       dexamethasone (DECADRON) injection 2 mg  2 mg  Intravenous Q6H Marcelyn Bruins, MD   2 mg at 06/25/21 1213   docusate sodium (COLACE) capsule 100 mg  100 mg Oral BID Mikey Bussing R, NP   100 mg at 06/25/21 1050   enoxaparin (LOVENOX) injection 40 mg  40 mg Subcutaneous Q24H Gonfa, Taye T, MD   40 mg at 06/24/21 2121   feeding supplement (ENSURE ENLIVE / ENSURE PLUS) liquid 237 mL  237 mL Oral BID BM Gonfa, Taye T, MD       gabapentin (NEURONTIN) capsule 300 mg  300 mg Oral QHS Wendee Beavers T, MD   300 mg at 06/24/21 2118   insulin aspart (novoLOG) injection 0-9 Units  0-9 Units Subcutaneous TID WC Marcelyn Bruins, MD   1 Units at 06/25/21 1212   lacosamide (VIMPAT) tablet 50 mg  50 mg Oral BID Marcelyn Bruins, MD   50 mg at 06/25/21 1050   letrozole [facility]) tablet 2.5 mg  2.5 mg Oral Daily Marcelyn Bruins, MD   2.5 mg at 06/25/21 1050   multivitamin with minerals tablet 1 tablet  1 tablet Oral Daily Gonfa, Taye T, MD       polyethylene glycol (MIRALAX / GLYCOLAX) packet 17 g  17 g Oral Daily PRN Marcelyn Bruins, MD       potassium  PHOSPHATE 30 mmol in dextrose 5 % 500 mL infusion  30 mmol Intravenous Once Wendee Beavers T, MD 85 mL/hr at 06/25/21 1054 30 mmol at 06/25/21 1054   sodium chloride flush (NS) 0.9 % injection 3 mL  3 mL Intravenous Q12H Marcelyn Bruins, MD   3 mL at 06/24/21 2118   thyroid (ARMOUR) tablet 60 mg  60 mg Oral Q0600 Marcelyn Bruins, MD   60 mg at 06/25/21 4401    REVIEW OF SYSTEMS:  On review of systems, the patient reports that she is doing fairly well overall although she does admit that she is becoming more weak and frail.  She has had some dizziness and weakness, particularly in the lower extremities.  She denies headaches or difficulty with speech.  She denies any chest pain, shortness of breath, cough, fevers, chills, or night sweats.  She has continued to lose weight, unintentionally.  She denies any bowel or bladder disturbances, and denies abdominal pain, nausea or vomiting.  She denies any new musculoskeletal or joint aches or pains. A complete review of systems is obtained and is otherwise negative.    PHYSICAL EXAM:  Wt Readings from Last 3 Encounters:  06/23/21 110 lb (49.9 kg)  06/17/21 116 lb (52.6 kg)  06/15/21 116 lb (52.6 kg)   Temp Readings from Last 3 Encounters:  06/25/21 98.3 F (36.8 C) (Oral)  06/17/21 (!) 97.1 F (36.2 C) (Temporal)  06/02/21 98.3 F (36.8 C) (Oral)   BP Readings from Last 3 Encounters:  06/25/21 119/80  06/17/21 131/85  06/15/21 100/62   Pulse Readings from Last 3 Encounters:  06/25/21 100  06/17/21 100  06/15/21 95   Pain Assessment Pain Score: 0-No pain/10  In general this is a well appearing Caucasian female in no acute distress.  She's alert and oriented x4 and appropriate throughout the examination. Cardiopulmonary assessment is negative for acute distress and she exhibits normal effort.    KPS = 80  100 - Normal; no complaints; no evidence of disease. 90   - Able to carry on normal activity; minor signs or symptoms of  disease. 80   - Normal activity  with effort; some signs or symptoms of disease. 22   - Cares for self; unable to carry on normal activity or to do active work. 60   - Requires occasional assistance, but is able to care for most of his personal needs. 50   - Requires considerable assistance and frequent medical care. 3   - Disabled; requires special care and assistance. 85   - Severely disabled; hospital admission is indicated although death not imminent. 23   - Very sick; hospital admission necessary; active supportive treatment necessary. 10   - Moribund; fatal processes progressing rapidly. 0     - Dead  Karnofsky DA, Abelmann [place], Craver LS and Burchenal [facility] [phone]) The use of the nitrogen mustards in the palliative treatment of carcinoma: with particular reference to bronchogenic carcinoma Cancer 1 634-56  LABORATORY DATA:  Lab Results  Component Value Date   WBC 12.7 (H) 06/25/2021   HGB 8.3 (L) 06/25/2021   HCT 27.6 (L) 06/25/2021   MCV 85.7 06/25/2021   PLT 743 (H) 06/25/2021   Lab Results  Component Value Date   NA 134 (L) 06/25/2021   K 3.9 06/25/2021   CL 94 (L) 06/25/2021   CO2 26 06/25/2021   Lab Results  Component Value Date   ALT 10 06/24/2021   AST 11 (L) 06/24/2021   ALKPHOS 87 06/24/2021   BILITOT 0.6 06/24/2021     RADIOGRAPHY: DG Chest 2 View  Result Date: 06/23/2021 CLINICAL DATA:  New onset leg weakness and difficulty walking. EXAM: CHEST - 2 VIEW COMPARISON:  Chest x-ray dated May 28, 2021. FINDINGS: Unchanged left-sided PleurX catheter. Stable appearance of the chest with large loculated left pleural effusion and near complete collapse of the left lung. Large left lower lobe mass is obscured. The right lung remains clear. Stable cardiomediastinal silhouette with rightward mediastinal shift. No pneumothorax. No acute osseous abnormality. IMPRESSION: 1. Stable large loculated left pleural effusion and near complete collapse of the left lung.  Electronically Signed   By: Titus Dubin M.D.   On: 06/23/2021 16:20   CT HEAD WO CONTRAST (5MM)  Result Date: 06/23/2021 CLINICAL DATA:  Weakness.  History of metastatic lung cancer. EXAM: CT HEAD WITHOUT CONTRAST TECHNIQUE: Contiguous axial images were obtained from the base of the skull through the vertex without intravenous contrast. RADIATION DOSE REDUCTION: This exam was performed according to the departmental dose-optimization program which includes automated exposure control, adjustment of the mA and/or kV according to patient size and/or use of iterative reconstruction technique. COMPARISON:  01/28/2021 and brain MR 05/21/2021 FINDINGS: Brain: Again noted is vasogenic edema in the left parietal lobe and this may have progressed since the MRI on 05/21/2021. There is new vasogenic edema in the right parietooccipital region and concerning for a new underlying lesion. There is also new low-density in the right frontal lobe on series 2, image 12. No evidence for acute hemorrhage. Ventricles are stable and there is no significant midline shift. Vascular: No hyperdense vessel or unexpected calcification. Skull: Normal. Negative for fracture or focal lesion. Sinuses/Orbits: Visualized sinuses are clear. Other: None IMPRESSION: 1. Patient has known intracranial metastatic disease and there are new areas of vasogenic edema in the right frontal lobe and right parietal-occipital region. In addition, the edema in the left parietal lobe may have increased. Findings are suggestive for progression of intracranial metastatic disease. This could be better evaluated with MRI. 2. No evidence for acute hemorrhage. Electronically Signed   By: Scherrie Gerlach.D.  On: 06/23/2021 16:31   MR Brain W and Wo Contrast  Result Date: 06/23/2021 CLINICAL DATA:  Initial evaluation for CNS neoplasm, assess treatment response. EXAM: MRI HEAD WITHOUT AND WITH CONTRAST TECHNIQUE: Multiplanar, multiecho pulse sequences of the brain and  surrounding structures were obtained without and with intravenous contrast. CONTRAST:  5.46m GADAVIST GADOBUTROL 1 MMOL/ML IV SOLN COMPARISON:  Head CT from earlier the same day as well as previous brain MRI from 05/21/2021. FINDINGS: Brain: Cerebral volume stable, and remains within normal limits. Underlying mild chronic microvascular ischemic disease noted, also unchanged. No evidence for acute or subacute infarct. No acute or chronic intracranial hemorrhage. No extra-axial collection. No intracranial metastatic disease again seen. Overall, appearance is slightly worsened and progressed from previous, with interval enlargement of multiple lesions as compared to previous. For example, a lesion positioned at the gray-white matter differentiation of the left parietal region now measures up to 11 mm, previously 9 mm (series 16, image 116). A lesion slightly anteriorly within the posterior left frontal region measures up to 10 mm, previously 7 mm (series 16, image 16). On the right, a lesion at the anterior right frontal lobe now measures 6 mm, previously 5 mm (series 16, image 76). A dural based lesion at the right occipital lobe measures 8 mm, previously 5 mm (series 16, image 78). Additionally, several new punctate lesions are seen at the posterior left frontotemporal region, not definitely seen on prior (series 16, images 90, 81). Surrounding vasogenic edema seen about the larger lesions, mildly progressed, and most pronounced at the left parietal lobe. Mild mass effect on the adjacent left lateral ventricle with trace left-to-right shift. No hydrocephalus or trapping. Basilar cisterns remain patent. Vascular: Major intracranial vascular flow voids are maintained. Skull and upper cervical spine: Craniocervical junction within normal limits. Decreased T1 signal intensity seen within the visualized bone marrow without focal marrow replacing lesion. No scalp soft tissue abnormality. Sinuses/Orbits: Right gaze noted.  Globes and orbital soft tissues demonstrate no other acute finding. Paranasal sinuses are largely clear. No mastoid effusion. Inner ear structures grossly normal. Other: None. IMPRESSION: 1. Interval enlargement of multiple metastatic lesions involving the bilateral cerebral hemispheres, with a few new punctate lesions as above, consistent with interval progression of disease. Associated vasogenic edema about the larger lesions, also mildly progressed. Associated trace left-to-right shift without hydrocephalus or trapping. 2. No other acute intracranial abnormality. Electronically Signed   By: BJeannine BogaM.D.   On: 06/23/2021 22:16   DG CHEST PORT 1 VIEW  Result Date: 05/28/2021 CLINICAL DATA:  Chest tube, history lung cancer, multiple prior thoracentesis procedures EXAM: PORTABLE CHEST 1 VIEW COMPARISON:  Portable exam 0856 hours compared to 05/11/2021 FINDINGS: Complete opacification of the LEFT hemithorax by large pleural effusion and accompanying LEFT lung atelectasis/consolidation; known LEFT lung mass obscured. LEFT PleurX catheter identified at inferior LEFT hemithorax. Mediastinal shift to the RIGHT. RIGHT lung otherwise clear. Skin folds project over LEFT chest without pneumothorax. Bones demineralized. IMPRESSION: Complete opacification of the LEFT hemithorax by LEFT pleural effusion, known LEFT lung mass and LEFT lung atelectasis/consolidation. Electronically Signed   By: MLavonia DanaM.D.   On: 05/28/2021 09:09      IMPRESSION/PLAN: 1. 71y.o. 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 We again further discussed the recommendation for whole brain radiotherapy, particularly in light of the progressive brain disease as  well as lack of control of her systemic disease.  We again reviewed the acute and late effects associated with  whole brain radiation and she remains undecided regarding her desire to proceed.  She is leaning towards just continuing her holistic approach with her integrative medicine doctor but will give this some further consideration and will let us know if she ultimately decides to proceed.  She has our contact information and knows that she is welcome to call at anytime with any questions or concerns related to the radiation.    I personally spent 60 minutes in this encounter including chart review, reviewing radiological studies, meeting face-to-face with the patient, entering orders and completing documentation.    Nicholos Johns, PA-C   Jodelle Gross, MD, PhD   [facility]   Radiation Oncology Direct Dial: [phone]   Fax: [phone] [email or web address]   Skype   LinkedIn

## 2021-06-25 NOTE — Progress Notes (Signed)
PROGRESS NOTE  Tami Stone BTD:[id number] DOB: [birth year]   PCP: Gaynelle Cage, MD  Patient is from: Home.  Lives with family.  Independently ambulates at baseline.  DOA: 06/23/2021 LOS: 2  Chief complaints:  Chief Complaint  Patient presents with   Weakness     Brief Narrative / Interim history: 71 year old F with PMH of metastatic lung cancer, history of breast cancer, chronic large left-sided pleural effusion, DM-2, anemia, HLD, hypothyroidism, neuropathy and syncope presenting with difficulty walking, dizziness, worsening neuropathic pain, head twitch and right body tightness, and admitted with worsening vasogenic cerebral edema due to worsening metastatic brain lesions as noted on MRI brain.  Neuro oncology consulted and recommended Decadron and Vimpat.  Oncology consulted but patient is not interested in chemotherapy or immunotherapy.  Radiation oncology consulted but the patient is undecided about radiation therapy either.  She is planning to try holistic treatment with "DCO" and "[place] tail".   Subjective: Seen and examined earlier this morning and later this afternoon.  She was well earlier this morning other than some nausea with first meal that is normal for her due to her dysphagia.  She felt dizzy later in the afternoon when she got up.  She describes the dizziness as vertigo.  Reports improvement in her numbness.   Objective: Vitals:   06/24/21 1711 06/24/21 2109 06/25/21 0047 06/25/21 0457  BP: 120/86 132/88 122/84 119/80  Pulse: 98 98 95 100  Resp: 13 17 18 16   Temp: 97.9 F (36.6 C) 98.4 F (36.9 C) 98 F (36.7 C) 98.3 F (36.8 C)  TempSrc: Oral Oral Oral Oral  SpO2: 98% 97% 97% 94%  Weight:      Height:        Examination:  GENERAL: No apparent distress.  Nontoxic. HEENT: MMM.  Vision and hearing grossly intact.  NECK: Supple.  No apparent JVD.  RESP:  No IWOB.  Fair aeration bilaterally. CVS:  RRR. Heart sounds normal.  ABD/GI/GU: BS+. Abd soft,  NTND.  MSK/EXT:  Moves extremities. No apparent deformity. No edema.  SKIN: no apparent skin lesion or wound NEURO: Awake and alert. Oriented appropriately.  No nystagmus.  No apparent focal neuro deficit. PSYCH: Calm. Normal affect.   Procedures:  None  Microbiology summarized: [id number] and influenza PCR nonreactive.  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  Has Pleurx cath. History of breast cancer Nausea/dizziness -MRI brain with interval enlargement of multiple metastatic lesions and associated vasogenic edema with trace left-to-right shift without hydrocephalus or trapping. -Continue Vimpat and Decadron per neurooncology recommendation -Not interested in chemo or immunotherapy.  Undecided about radiotherapy.  -Planning to start holistic treatment with "DCO and [place] tail". -Palliative medicine consulted. -Antiemetics as needed for nausea. -PT/OT eval  Syncope?  History of this.  However, she describes her current dizziness as spinning sensation.  Could be related to malignancy or autonomic neuropathy from diabetes. -Check orthostatic vitals -PT/OT eval  Controlled NIDDM-2 with neuropathy: A1c 5.4%.  On metformin at home. Recent Labs  Lab 06/24/21 1152 06/24/21 1719 06/24/21 2103 06/25/21 0748 06/25/21 1150  GLUCAP 104* 113* 179* 129* 143*  -Continue current insulin regimen. -Start low-dose gabapentin for pain  Anemia of chronic disease: Likely due to malignancy.  H&H relatively stable. 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  Stable.  Thrombocytosis: Reactive? -Continue monitoring.  Body mass index is 20.12 kg/m.         DVT prophylaxis:  enoxaparin (LOVENOX) injection 40 mg Start: 06/24/21 2200  Code Status: DNR/DNI Family Communication: Patient and/or RN. Available if any question.  Level of care: Telemetry Status is: Inpatient  Remains inpatient appropriate because: Due to symptomatic vasogenic edema from metastatic lung cancer to brain  Final disposition: Likely home in the next 24 to 48 hours once dizziness improves   Consultants:  Neuro oncology over the phone by admitted Oncology Radiation oncology? Palliative medicine   Sch Meds:  Scheduled Meds:  dexamethasone (DECADRON) injection  2 mg Intravenous Q6H   docusate sodium  100 mg Oral BID   enoxaparin (LOVENOX) injection  40 mg Subcutaneous Q24H   feeding supplement  237 mL Oral BID BM   gabapentin  300 mg Oral QHS   insulin aspart  0-9 Units Subcutaneous TID WC   lacosamide  50 mg Oral BID   letrozole  2.5 mg Oral Daily   multivitamin with minerals  1 tablet Oral Daily   sodium chloride flush  3 mL Intravenous Q12H   thyroid  60 mg Oral Q0600   Continuous Infusions:  sodium chloride 50 mL/hr at 06/25/21 0830   potassium PHOSPHATE IVPB (in mmol) 30 mmol (06/25/21 1054)   PRN Meds:.acetaminophen **OR** acetaminophen, polyethylene glycol  Antimicrobials: Anti-infectives (From admission, onward)    None        I have personally reviewed the following labs and images: CBC: Recent Labs  Lab 06/23/21 1623 06/24/21 0721 06/25/21 0455  WBC 13.3* 9.0 12.7*  NEUTROABS 8.8*  --   --   HGB 8.8* 8.1* 8.3*  HCT 28.8* 26.1* 27.6*  MCV 84.0 84.2 85.7  PLT 759* 656* 743*   BMP &GFR Recent Labs  Lab 06/23/21 1623 06/24/21 0721 06/25/21 0455  NA 130* 130* 134*  K 4.1 4.4 3.9  CL 92* 95* 94*  CO2 25 26 26   GLUCOSE 113* 104* 163*  BUN 14 14 12   CREATININE 0.61 0.51 <0.30*  CALCIUM 8.6* 7.9* 8.4*  MG   --   --  1.9  PHOS  --   --  1.9*   CrCl cannot be calculated (This lab value cannot be used to calculate CrCl because it is not a number: <0.30). Liver & Pancreas: Recent Labs  Lab 06/23/21 1623 06/24/21 0721 06/25/21 0455  AST 13* 11*  --   ALT 10 10  --   ALKPHOS 106 87  --   BILITOT 0.5 0.6  --   PROT 7.1 6.4*  --   ALBUMIN 2.6* 2.3* 2.2*   No results for input(s): LIPASE, AMYLASE in the last 168 hours. No results for input(s): AMMONIA in the last 168 hours. Diabetic: Recent Labs    06/25/21 0455  HGBA1C 5.4   Recent Labs  Lab 06/24/21 1152 06/24/21 1719 06/24/21 2103 06/25/21 0748 06/25/21 1150  GLUCAP 104* 113* 179* 129* 143*   Cardiac Enzymes: No results for input(s): CKTOTAL, CKMB, CKMBINDEX, TROPONINI in the last 168 hours. No results for input(s): PROBNP in the last 8760 hours. Coagulation Profile: No results for input(s): INR, PROTIME in the last 168 hours. Thyroid Function Tests: No results for input(s): TSH, T4TOTAL, FREET4, T3FREE, THYROIDAB  in the last 72 hours. Lipid Profile: No results for input(s): CHOL, HDL, LDLCALC, TRIG, CHOLHDL, LDLDIRECT in the last 72 hours. Anemia Panel: Recent Labs    06/25/21 0730  TIBC 146*  IRON 15*   Urine analysis:    Component Value Date/Time   COLORURINE STRAW (A) 06/24/2021 0642   APPEARANCEUR CLEAR 06/24/2021 0642   LABSPEC 1.009 06/24/2021 0642   PHURINE 6.0 06/24/2021 0642   GLUCOSEU NEGATIVE 06/24/2021 0642   HGBUR NEGATIVE 06/24/2021 0642   BILIRUBINUR NEGATIVE 06/24/2021 0642   KETONESUR 20 (A) 06/24/2021 0642   PROTEINUR NEGATIVE 06/24/2021 0642   NITRITE NEGATIVE 06/24/2021 0642   LEUKOCYTESUR NEGATIVE 06/24/2021 0642   Sepsis Labs: Invalid input(s): PROCALCITONIN, [place]  Microbiology: Recent Results (from the past 240 hour(s))  Resp Panel by RT-PCR (Flu A&B, Covid) Nasopharyngeal Swab     Status: None   Collection Time: 06/23/21  4:20 PM   Specimen: Nasopharyngeal Swab;  Nasopharyngeal(NP) swabs in vial transport medium  Result Value Ref Range Status   SARS Coronavirus 2 by RT PCR NEGATIVE NEGATIVE Final    Comment: (NOTE) SARS-CoV-2 target nucleic acids are NOT DETECTED.  The SARS-CoV-2 RNA is generally detectable in upper respiratory specimens during the acute phase of infection. The lowest concentration of SARS-CoV-2 viral copies this assay can detect is 138 copies/mL. A negative result does not preclude SARS-Cov-2 infection and should not be used as the sole basis for treatment or other patient management decisions. A negative result may occur with  improper specimen collection/handling, submission of specimen other than nasopharyngeal swab, presence of viral mutation(s) within the areas targeted by this assay, and inadequate number of viral copies(<138 copies/mL). A negative result must be combined with clinical observations, patient history, and epidemiological information. The expected result is Negative.  Fact Sheet for Patients:  EntrepreneurPulse.com.au  Fact Sheet for Healthcare Providers:  IncredibleEmployment.be  This test is no t yet approved or cleared by the [place] FDA and  has been authorized for detection and/or diagnosis of SARS-CoV-2 by FDA under an Emergency Use Authorization (EUA). This EUA will remain  in effect (meaning this test can be used) for the duration of the COVID-19 declaration under Section 564(b)(1) of the Act, 21 U.S.C.section 360bbb-3(b)(1), unless the authorization is terminated  or revoked sooner.       Influenza A by PCR NEGATIVE NEGATIVE Final   Influenza B by PCR NEGATIVE NEGATIVE Final    Comment: (NOTE) The Xpert Xpress SARS-CoV-2/FLU/RSV plus assay is intended as an aid in the diagnosis of influenza from Nasopharyngeal swab specimens and should not be used as a sole basis for treatment. Nasal washings and aspirates are unacceptable for Xpert Xpress  SARS-CoV-2/FLU/RSV testing.  Fact Sheet for Patients: EntrepreneurPulse.com.au  Fact Sheet for Healthcare Providers: IncredibleEmployment.be  This test is not yet approved or cleared by the [place] FDA and has been authorized for detection and/or diagnosis of SARS-CoV-2 by FDA under an Emergency Use Authorization (EUA). This EUA will remain in effect (meaning this test can be used) for the duration of the COVID-19 declaration under Section 564(b)(1) of the Act, 21 U.S.C. section 360bbb-3(b)(1), unless the authorization is terminated or revoked.  Performed at [facility], [place] [address], [place], [place] [address]     Radiology Studies: No results found.     Stellar Gensel T. [place]  If 7PM-7AM, please contact night-coverage [email or web address] 06/25/2021, 3:15 PM

## 2021-06-25 NOTE — Care Management Important Message (Signed)
Important Message  Patient Details IM Letter placed in Patients room. Name: Tami Stone MRN: [id number] Date of Birth: [birth year]   Medicare Important Message Given:  Yes     Kerin Salen 06/25/2021, 10:20 AM

## 2021-06-25 NOTE — Progress Notes (Signed)
°  Transition of Care (TOC) Screening Note   Patient Details  Name: Tami Stone Date of Birth: 07-07-1950   Transition of Care [facility]) CM/SW Contact:    Omunique Pederson, Marjie Skiff, RN Phone Number: 06/25/2021, 1:37 PM    Transition of Care Department [facility]) has reviewed patient and no TOC needs have been identified at this time. We will continue to monitor patient advancement through interdisciplinary progression rounds. If new patient transition needs arise, please place a TOC consult.

## 2021-06-26 DIAGNOSIS — C7931 Secondary malignant neoplasm of brain: Secondary | ICD-10-CM | POA: Diagnosis not present

## 2021-06-26 DIAGNOSIS — C3492 Malignant neoplasm of unspecified part of left bronchus or lung: Secondary | ICD-10-CM | POA: Diagnosis not present

## 2021-06-26 DIAGNOSIS — G936 Cerebral edema: Secondary | ICD-10-CM | POA: Diagnosis not present

## 2021-06-26 LAB — COMPREHENSIVE METABOLIC PANEL
ALT: 12 U/L (ref 0–44)
AST: 13 U/L — ABNORMAL LOW (ref 15–41)
Albumin: 2.4 g/dL — ABNORMAL LOW (ref 3.5–5.0)
Alkaline Phosphatase: 82 U/L (ref 38–126)
Anion gap: 11 (ref 5–15)
BUN: 10 mg/dL (ref 8–23)
CO2: 28 mmol/L (ref 22–32)
Calcium: 8.8 mg/dL — ABNORMAL LOW (ref 8.9–10.3)
Chloride: 96 mmol/L — ABNORMAL LOW (ref 98–111)
Creatinine, Ser: <0.3 mg/dL — ABNORMAL LOW (ref 0.44–1.00)
Glucose, Bld: 124 mg/dL — ABNORMAL HIGH (ref 70–99)
Potassium: 4.3 mmol/L (ref 3.5–5.1)
Sodium: 135 mmol/L (ref 135–145)
Total Bilirubin: 0.3 mg/dL (ref 0.3–1.2)
Total Protein: 6.6 g/dL (ref 6.5–8.1)

## 2021-06-26 LAB — CBC WITH DIFFERENTIAL/PLATELET
Abs Immature Granulocytes: 0.07 10*3/uL (ref 0.00–0.07)
Basophils Absolute: 0 10*3/uL (ref 0.0–0.1)
Basophils Relative: 0 %
Eosinophils Absolute: 0.5 10*3/uL (ref 0.0–0.5)
Eosinophils Relative: 4 %
HCT: 27.5 % — ABNORMAL LOW (ref 36.0–46.0)
Hemoglobin: 8.4 g/dL — ABNORMAL LOW (ref 12.0–15.0)
Immature Granulocytes: 1 %
Lymphocytes Relative: 16 %
Lymphs Abs: 2 10*3/uL (ref 0.7–4.0)
MCH: 26.3 pg (ref 26.0–34.0)
MCHC: 30.5 g/dL (ref 30.0–36.0)
MCV: 85.9 fL (ref 80.0–100.0)
Monocytes Absolute: 1.3 10*3/uL — ABNORMAL HIGH (ref 0.1–1.0)
Monocytes Relative: 10 %
Neutro Abs: 8.7 10*3/uL — ABNORMAL HIGH (ref 1.7–7.7)
Neutrophils Relative %: 69 %
Platelets: 729 10*3/uL — ABNORMAL HIGH (ref 150–400)
RBC: 3.2 MIL/uL — ABNORMAL LOW (ref 3.87–5.11)
RDW: 17.4 % — ABNORMAL HIGH (ref 11.5–15.5)
WBC: 12.6 10*3/uL — ABNORMAL HIGH (ref 4.0–10.5)
nRBC: 0 % (ref 0.0–0.2)

## 2021-06-26 LAB — GLUCOSE, CAPILLARY
Glucose-Capillary: 137 mg/dL — ABNORMAL HIGH (ref 70–99)
Glucose-Capillary: 209 mg/dL — ABNORMAL HIGH (ref 70–99)
Glucose-Capillary: 142 mg/dL — ABNORMAL HIGH (ref 70–99)

## 2021-06-26 LAB — PREALBUMIN: Prealbumin: 8.4 mg/dL — ABNORMAL LOW (ref 18–38)

## 2021-06-26 LAB — PREPARE RBC (CROSSMATCH)

## 2021-06-26 MED ORDER — SODIUM CHLORIDE 0.9% IV SOLUTION: Freq: Once | INTRAVENOUS | Status: AC

## 2021-06-26 MED ORDER — LACOSAMIDE 50 MG PO TABS: 50.0000 mg | ORAL_TABLET | Freq: Two times a day (BID) | ORAL | 0 refills | Status: AC

## 2021-06-26 MED ORDER — GABAPENTIN 300 MG PO CAPS: 300.0000 mg | ORAL_CAPSULE | Freq: Every day | ORAL | 0 refills | Status: AC

## 2021-06-26 MED ORDER — METFORMIN HCL 500 MG PO TABS
500.0000 mg | ORAL_TABLET | Freq: Three times a day (TID) | ORAL | Status: DC
  Administered 2021-06-26 (×2): 500 mg via ORAL
  Filled 2021-06-26 (×2): qty 1

## 2021-06-26 MED ORDER — FUROSEMIDE 10 MG/ML IJ SOLN
20.0000 mg | Freq: Once | INTRAMUSCULAR | Status: AC
  Administered 2021-06-26: 20 mg via INTRAVENOUS
  Filled 2021-06-26: qty 2

## 2021-06-26 MED ORDER — DOCUSATE SODIUM 100 MG PO CAPS: 100.0000 mg | ORAL_CAPSULE | Freq: Two times a day (BID) | ORAL | 0 refills | Status: AC

## 2021-06-26 MED ORDER — DEXAMETHASONE 4 MG PO TABS: 4.0000 mg | ORAL_TABLET | Freq: Two times a day (BID) | ORAL | 0 refills | Status: AC

## 2021-06-26 MED ORDER — DEXAMETHASONE 4 MG PO TABS
4.0000 mg | ORAL_TABLET | Freq: Two times a day (BID) | ORAL | Status: DC
  Administered 2021-06-26: 4 mg via ORAL
  Filled 2021-06-26: qty 1

## 2021-06-26 NOTE — Progress Notes (Signed)
OT Cancellation Note  Patient Details Name: Tami Stone MRN: [id number] DOB: 02-08-1951   Cancelled Treatment:    Reason Eval/Treat Not Completed: Other (comment). Patient declined therapy until after blood transfusion. Will f/u as able.  Kamaree Berkel L Meara Wiechman 06/26/2021, 2:12 PM

## 2021-06-26 NOTE — Discharge Summary (Signed)
Physician Discharge Summary  Tami Stone WUJ:[id number] DOB: February 05, 1951 DOA: 06/23/2021  PCP: Gaynelle Cage, MD  Admit date: 06/23/2021 Discharge date: 06/26/2021  Admitted From: Home Disposition:  Home  Recommendations for Outpatient Follow-up:  Follow up with PCP in 1-2 weeks Follow up with Oncology as scheduled Recommend referral to outpatient Palliative Care  Discharge Condition:Stable CODE STATUS:DNR Diet recommendation: Diabetic   Brief/Interim Summary: 71 year old F with PMH of metastatic lung cancer, history of breast cancer, chronic large left-sided pleural effusion, DM-2, anemia, HLD, hypothyroidism, neuropathy and syncope presenting with difficulty walking, dizziness, worsening neuropathic pain, head twitch and right body tightness, and admitted with worsening vasogenic cerebral edema due to worsening metastatic brain lesions as noted on MRI brain.  Neuro oncology consulted and recommended Decadron and Vimpat.  Oncology consulted but patient is not interested in chemotherapy or immunotherapy.  Radiation oncology consulted but the patient is undecided about radiation therapy either.  She is planning to try holistic treatment with "DCO" and "[place] tail".   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  Has Pleurx cath. History of breast cancer Nausea/dizziness -MRI brain with interval enlargement of multiple metastatic lesions and associated vasogenic edema with trace left-to-right shift without hydrocephalus or  trapping. -Continue Vimpat and Decadron per neurooncology recommendation -Not interested in chemo or immunotherapy.  Undecided about radiotherapy.  -Pt is planning "DCO and [place] tail" holistic therapy -Recommend outpatient Palliative Care.   History of Syncope, current syncope ruled out.  However, she describes her current dizziness as spinning sensation.   -Pt declined PT/OT eval   Controlled NIDDM-2 with neuropathy: A1c 5.4%.  On metformin at home.   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  Stable.   Thrombocytosis: Likely reactive -Continue monitoring.   Anemia, normocytic -No evidence of acute blood loss -Suspect secondary to underlying malignancy -2 units of PRBC's ordered by Oncology     Discharge Instructions   Allergies as of 06/26/2021   No Known Allergies      Medication List     STOP taking these medications    dabrafenib mesylate 75 MG capsule Commonly known as: TAFINLAR   IVERMECTIN PO   trametinib dimethyl sulfoxide 2 MG tablet Commonly known as: MEKINIST       TAKE these medications    AMBULATORY NON FORMULARY MEDICATION 2 capsules 2 (two) times daily. Medication Name: [place] tell mushroom supplement   Apricot Flavor Powd Take 2 capsules by mouth daily. Apricot Seed Supplement   CO Q 10 PO Take 300 mg by mouth at bedtime.   DANDELION PO Take 1 tablet by mouth daily with supper.   dexamethasone 4 MG tablet Commonly known as: DECADRON Take 1 tablet (4 mg total) by mouth every 12 (twelve) hours.   docusate sodium 100 MG capsule Commonly known as: COLACE Take 1 capsule (100 mg total) by mouth 2 (two) times daily.   gabapentin 300 MG capsule Commonly known as: NEURONTIN Take 1 capsule (300 mg total) by mouth at bedtime.   ibuprofen  200 MG tablet Commonly known as: ADVIL Take 400 mg by mouth every 6 (six) hours as needed for mild pain.   IODINE (KELP)  PO Take 1 capsule by mouth in the morning.   lacosamide 50 MG Tabs tablet Commonly known as: VIMPAT Take 1 tablet (50 mg total) by mouth 2 (two) times daily.   letrozole 2.5 MG tablet Commonly known as: FEMARA Take 1 tablet (2.5 mg total) by mouth daily.   metFORMIN 500 MG tablet Commonly known as: Glucophage Take 1 tablet (500 mg total) by mouth 3 (three) times daily.   multivitamin-prenatal 27-0.8 MG Tabs tablet Take 1 tablet by mouth daily at 12 noon.   NP Thyroid 60 MG tablet Generic drug: thyroid Take 60 mg by mouth daily.   polyethylene glycol 17 g packet Commonly known as: MIRALAX / GLYCOLAX Take 17 g by mouth daily.   TURMERIC PLUS BLACK PEPPER EXT PO Take 1 capsule by mouth in the morning, at noon, and at bedtime.   Vitamin D 125 MCG (5000 UT) Caps Take 10,000 Units by mouth every other day.   Zinc 100 MG Tabs Take 100 mg by mouth daily.        Follow-up Information     Gaynelle Cage, MD Follow up in 2 week(s).   Specialty: General Practice Why: Hospital follow up Contact information: [address] [place] [address] [phone]         Volanda Napoleon, MD Follow up.   Specialty: Oncology Why: Hospital follow up Contact information: [address] [place] [address] [phone]                No Known Allergies  Consultations: Oncology Palliative Care  Procedures/Studies: DG Chest 2 View  Result Date: 06/23/2021 CLINICAL DATA:  New onset leg weakness and difficulty walking. EXAM: CHEST - 2 VIEW COMPARISON:  Chest x-ray dated May 28, 2021. FINDINGS: Unchanged left-sided PleurX catheter. Stable appearance of the chest with large loculated left pleural effusion and near complete collapse of the left lung. Large left lower lobe mass is obscured. The right lung remains clear. Stable cardiomediastinal silhouette with rightward mediastinal shift. No pneumothorax. No acute osseous abnormality. IMPRESSION:  1. Stable large loculated left pleural effusion and near complete collapse of the left lung. Electronically Signed   By: Titus Dubin M.D.   On: 06/23/2021 16:20   CT HEAD WO CONTRAST (5MM)  Result Date: 06/23/2021 CLINICAL DATA:  Weakness.  History of metastatic lung cancer. EXAM: CT HEAD WITHOUT CONTRAST TECHNIQUE: Contiguous axial images were obtained from the base of the skull through the vertex without intravenous contrast. RADIATION DOSE REDUCTION: This exam was performed according to the departmental dose-optimization program which includes automated exposure control, adjustment of the mA and/or kV according to patient size and/or use of iterative reconstruction technique. COMPARISON:  01/28/2021 and brain MR 05/21/2021 FINDINGS: Brain: Again noted is vasogenic edema in the left parietal lobe and this may have progressed since the MRI on 05/21/2021. There is new vasogenic edema in the right parietooccipital region and concerning for a new underlying lesion. There is also new low-density in the right frontal lobe on series 2, image 12. No evidence for acute hemorrhage. Ventricles are stable and there is no significant midline shift. Vascular: No hyperdense vessel or unexpected calcification. Skull: Normal. Negative for fracture or focal lesion. Sinuses/Orbits: Visualized sinuses are clear. Other: None IMPRESSION: 1. Patient has known intracranial metastatic disease and there are new areas  of vasogenic edema in the right frontal lobe and right parietal-occipital region. In addition, the edema in the left parietal lobe may have increased. Findings are suggestive for progression of intracranial metastatic disease. This could be better evaluated with MRI. 2. No evidence for acute hemorrhage. Electronically Signed   By: Markus Daft M.D.   On: 06/23/2021 16:31   MR Brain W and Wo Contrast  Result Date: 06/23/2021 CLINICAL DATA:  Initial evaluation for CNS neoplasm, assess treatment response. EXAM: MRI HEAD  WITHOUT AND WITH CONTRAST TECHNIQUE: Multiplanar, multiecho pulse sequences of the brain and surrounding structures were obtained without and with intravenous contrast. CONTRAST:  5.70mL GADAVIST GADOBUTROL 1 MMOL/ML IV SOLN COMPARISON:  Head CT from earlier the same day as well as previous brain MRI from 05/21/2021. FINDINGS: Brain: Cerebral volume stable, and remains within normal limits. Underlying mild chronic microvascular ischemic disease noted, also unchanged. No evidence for acute or subacute infarct. No acute or chronic intracranial hemorrhage. No extra-axial collection. No intracranial metastatic disease again seen. Overall, appearance is slightly worsened and progressed from previous, with interval enlargement of multiple lesions as compared to previous. For example, a lesion positioned at the gray-white matter differentiation of the left parietal region now measures up to 11 mm, previously 9 mm (series 16, image 116). A lesion slightly anteriorly within the posterior left frontal region measures up to 10 mm, previously 7 mm (series 16, image 16). On the right, a lesion at the anterior right frontal lobe now measures 6 mm, previously 5 mm (series 16, image 76). A dural based lesion at the right occipital lobe measures 8 mm, previously 5 mm (series 16, image 78). Additionally, several new punctate lesions are seen at the posterior left frontotemporal region, not definitely seen on prior (series 16, images 90, 81). Surrounding vasogenic edema seen about the larger lesions, mildly progressed, and most pronounced at the left parietal lobe. Mild mass effect on the adjacent left lateral ventricle with trace left-to-right shift. No hydrocephalus or trapping. Basilar cisterns remain patent. Vascular: Major intracranial vascular flow voids are maintained. Skull and upper cervical spine: Craniocervical junction within normal limits. Decreased T1 signal intensity seen within the visualized bone marrow without focal  marrow replacing lesion. No scalp soft tissue abnormality. Sinuses/Orbits: Right gaze noted. Globes and orbital soft tissues demonstrate no other acute finding. Paranasal sinuses are largely clear. No mastoid effusion. Inner ear structures grossly normal. Other: None. IMPRESSION: 1. Interval enlargement of multiple metastatic lesions involving the bilateral cerebral hemispheres, with a few new punctate lesions as above, consistent with interval progression of disease. Associated vasogenic edema about the larger lesions, also mildly progressed. Associated trace left-to-right shift without hydrocephalus or trapping. 2. No other acute intracranial abnormality. Electronically Signed   By: Jeannine Boga M.D.   On: 06/23/2021 22:16   DG CHEST PORT 1 VIEW  Result Date: 05/28/2021 CLINICAL DATA:  Chest tube, history lung cancer, multiple prior thoracentesis procedures EXAM: PORTABLE CHEST 1 VIEW COMPARISON:  Portable exam 0856 hours compared to 05/11/2021 FINDINGS: Complete opacification of the LEFT hemithorax by large pleural effusion and accompanying LEFT lung atelectasis/consolidation; known LEFT lung mass obscured. LEFT PleurX catheter identified at inferior LEFT hemithorax. Mediastinal shift to the RIGHT. RIGHT lung otherwise clear. Skin folds project over LEFT chest without pneumothorax. Bones demineralized. IMPRESSION: Complete opacification of the LEFT hemithorax by LEFT pleural effusion, known LEFT lung mass and LEFT lung atelectasis/consolidation. Electronically Signed   By: Lavonia Dana M.D.   On: 05/28/2021 09:09  Subjective: Eager to go home  Discharge Exam: Vitals:   06/26/21 1626 06/26/21 1657  BP: 134/90 (!) 135/92  Pulse: (!) 105 (!) 108  Resp: 17 18  Temp: 98.9 F (37.2 C) 98.8 F (37.1 C)  SpO2: 96% 94%   Vitals:   06/26/21 1234 06/26/21 1317 06/26/21 1626 06/26/21 1657  BP: 122/86 117/82 134/90 (!) 135/92  Pulse: (!) 108 (!) 107 (!) 105 (!) 108  Resp: 16 15 17 18   Temp:  99.2 F (37.3 C) 98.2 F (36.8 C) 98.9 F (37.2 C) 98.8 F (37.1 C)  TempSrc: Oral Oral Oral Oral  SpO2: 95% 93% 96% 94%  Weight:      Height:        General: Pt is alert, awake, not in acute distress Cardiovascular: RRR, S1/S2 + Respiratory: CTA bilaterally, no wheezing, no rhonchi Abdominal: Soft, NT, ND, bowel sounds + Extremities: no edema, no cyanosis   The results of significant diagnostics from this hospitalization (including imaging, microbiology, ancillary and laboratory) are listed below for reference.     Microbiology: Recent Results (from the past 240 hour(s))  Resp Panel by RT-PCR (Flu A&B, Covid) Nasopharyngeal Swab     Status: None   Collection Time: 06/23/21  4:20 PM   Specimen: Nasopharyngeal Swab; Nasopharyngeal(NP) swabs in vial transport medium  Result Value Ref Range Status   SARS Coronavirus 2 by RT PCR NEGATIVE NEGATIVE Final    Comment: (NOTE) SARS-CoV-2 target nucleic acids are NOT DETECTED.  The SARS-CoV-2 RNA is generally detectable in upper respiratory specimens during the acute phase of infection. The lowest concentration of SARS-CoV-2 viral copies this assay can detect is 138 copies/mL. A negative result does not preclude SARS-Cov-2 infection and should not be used as the sole basis for treatment or other patient management decisions. A negative result may occur with  improper specimen collection/handling, submission of specimen other than nasopharyngeal swab, presence of viral mutation(s) within the areas targeted by this assay, and inadequate number of viral copies(<138 copies/mL). A negative result must be combined with clinical observations, patient history, and epidemiological information. The expected result is Negative.  Fact Sheet for Patients:  EntrepreneurPulse.com.au  Fact Sheet for Healthcare Providers:  IncredibleEmployment.be  This test is no t yet approved or cleared by the [place]  FDA and  has been authorized for detection and/or diagnosis of SARS-CoV-2 by FDA under an Emergency Use Authorization (EUA). This EUA will remain  in effect (meaning this test can be used) for the duration of the COVID-19 declaration under Section 564(b)(1) of the Act, 21 U.S.C.section 360bbb-3(b)(1), unless the authorization is terminated  or revoked sooner.       Influenza A by PCR NEGATIVE NEGATIVE Final   Influenza B by PCR NEGATIVE NEGATIVE Final    Comment: (NOTE) The Xpert Xpress SARS-CoV-2/FLU/RSV plus assay is intended as an aid in the diagnosis of influenza from Nasopharyngeal swab specimens and should not be used as a sole basis for treatment. Nasal washings and aspirates are unacceptable for Xpert Xpress SARS-CoV-2/FLU/RSV testing.  Fact Sheet for Patients: EntrepreneurPulse.com.au  Fact Sheet for Healthcare Providers: IncredibleEmployment.be  This test is not yet approved or cleared by the [place] FDA and has been authorized for detection and/or diagnosis of SARS-CoV-2 by FDA under an Emergency Use Authorization (EUA). This EUA will remain in effect (meaning this test can be used) for the duration of the COVID-19 declaration under Section 564(b)(1) of the Act, 21 U.S.C. section 360bbb-3(b)(1), unless the authorization is terminated  or revoked.  Performed at [facility], [place] [address], [place], [place] [address]      Labs: BNP (last 3 results) Recent Labs    01/18/21 1400  BNP 02.6   Basic Metabolic Panel: Recent Labs  Lab 06/23/21 1623 06/24/21 0721 06/25/21 0455 06/26/21 0633  NA 130* 130* 134* 135  K 4.1 4.4 3.9 4.3  CL 92* 95* 94* 96*  CO2 25 26 26 28   GLUCOSE 113* 104* 163* 124*  BUN 14 14 12 10   CREATININE 0.61 0.51 <0.30* <0.30*  CALCIUM 8.6* 7.9* 8.4* 8.8*  MG  --   --  1.9  --   PHOS  --   --  1.9*  --    Liver Function Tests: Recent Labs  Lab 06/23/21 1623  06/24/21 0721 06/25/21 0455 06/26/21 0633  AST 13* 11*  --  13*  ALT 10 10  --  12  ALKPHOS 106 87  --  82  BILITOT 0.5 0.6  --  0.3  PROT 7.1 6.4*  --  6.6  ALBUMIN 2.6* 2.3* 2.2* 2.4*   No results for input(s): LIPASE, AMYLASE in the last 168 hours. No results for input(s): AMMONIA in the last 168 hours. CBC: Recent Labs  Lab 06/23/21 1623 06/24/21 0721 06/25/21 0455 06/26/21 0633  WBC 13.3* 9.0 12.7* 12.6*  NEUTROABS 8.8*  --   --  8.7*  HGB 8.8* 8.1* 8.3* 8.4*  HCT 28.8* 26.1* 27.6* 27.5*  MCV 84.0 84.2 85.7 85.9  PLT 759* 656* 743* 729*   Cardiac Enzymes: No results for input(s): CKTOTAL, CKMB, CKMBINDEX, TROPONINI in the last 168 hours. BNP: Invalid input(s): POCBNP CBG: Recent Labs  Lab 06/25/21 1722 06/25/21 2029 06/26/21 0735 06/26/21 1126 06/26/21 1643  GLUCAP 163* 163* 137* 209* 142*   D-Dimer No results for input(s): DDIMER in the last 72 hours. Hgb A1c Recent Labs    06/25/21 0455  HGBA1C 5.4   Lipid Profile No results for input(s): CHOL, HDL, LDLCALC, TRIG, CHOLHDL, LDLDIRECT in the last 72 hours. Thyroid function studies No results for input(s): TSH, T4TOTAL, T3FREE, THYROIDAB in the last 72 hours.  Invalid input(s): FREET3 Anemia work up Recent Labs    06/25/21 0730  TIBC 146*  IRON 15*   Urinalysis    Component Value Date/Time   COLORURINE STRAW (A) 06/24/2021 0642   APPEARANCEUR CLEAR 06/24/2021 0642   LABSPEC 1.009 06/24/2021 0642   PHURINE 6.0 06/24/2021 0642   GLUCOSEU NEGATIVE 06/24/2021 0642   HGBUR NEGATIVE 06/24/2021 0642   BILIRUBINUR NEGATIVE 06/24/2021 0642   KETONESUR 20 (A) 06/24/2021 0642   PROTEINUR NEGATIVE 06/24/2021 0642   NITRITE NEGATIVE 06/24/2021 0642   LEUKOCYTESUR NEGATIVE 06/24/2021 3785   Sepsis Labs Invalid input(s): PROCALCITONIN,  WBC,  LACTICIDVEN Microbiology Recent Results (from the past 240 hour(s))  Resp Panel by RT-PCR (Flu A&B, Covid) Nasopharyngeal Swab     Status: None   Collection  Time: 06/23/21  4:20 PM   Specimen: Nasopharyngeal Swab; Nasopharyngeal(NP) swabs in vial transport medium  Result Value Ref Range Status   SARS Coronavirus 2 by RT PCR NEGATIVE NEGATIVE Final    Comment: (NOTE) SARS-CoV-2 target nucleic acids are NOT DETECTED.  The SARS-CoV-2 RNA is generally detectable in upper respiratory specimens during the acute phase of infection. The lowest concentration of SARS-CoV-2 viral copies this assay can detect is 138 copies/mL. A negative result does not preclude SARS-Cov-2 infection and should not be used as the sole basis for treatment or  other patient management decisions. A negative result may occur with  improper specimen collection/handling, submission of specimen other than nasopharyngeal swab, presence of viral mutation(s) within the areas targeted by this assay, and inadequate number of viral copies(<138 copies/mL). A negative result must be combined with clinical observations, patient history, and epidemiological information. The expected result is Negative.  Fact Sheet for Patients:  EntrepreneurPulse.com.au  Fact Sheet for Healthcare Providers:  IncredibleEmployment.be  This test is no t yet approved or cleared by the [place] FDA and  has been authorized for detection and/or diagnosis of SARS-CoV-2 by FDA under an Emergency Use Authorization (EUA). This EUA will remain  in effect (meaning this test can be used) for the duration of the COVID-19 declaration under Section 564(b)(1) of the Act, 21 U.S.C.section 360bbb-3(b)(1), unless the authorization is terminated  or revoked sooner.       Influenza A by PCR NEGATIVE NEGATIVE Final   Influenza B by PCR NEGATIVE NEGATIVE Final    Comment: (NOTE) The Xpert Xpress SARS-CoV-2/FLU/RSV plus assay is intended as an aid in the diagnosis of influenza from Nasopharyngeal swab specimens and should not be used as a sole basis for treatment. Nasal washings  and aspirates are unacceptable for Xpert Xpress SARS-CoV-2/FLU/RSV testing.  Fact Sheet for Patients: EntrepreneurPulse.com.au  Fact Sheet for Healthcare Providers: IncredibleEmployment.be  This test is not yet approved or cleared by the [place] FDA and has been authorized for detection and/or diagnosis of SARS-CoV-2 by FDA under an Emergency Use Authorization (EUA). This EUA will remain in effect (meaning this test can be used) for the duration of the COVID-19 declaration under Section 564(b)(1) of the Act, 21 U.S.C. section 360bbb-3(b)(1), unless the authorization is terminated or revoked.  Performed at [facility], [place] [address] [place], [place] [address]    Time spent: 30 min  SIGNED:   Marylu Lund, MD  [facility] 06/26/2021, 6:17 PM  If 7PM-7AM, please contact night-coverage

## 2021-06-26 NOTE — Progress Notes (Signed)
PT Cancellation Note  Patient Details Name: Tami Stone MRN: [id number] DOB: 17-Nov-1950   Cancelled Treatment:     PT order received but deferred at request of pt.  Pt with blood transfusion running and states "I have not been up today but I am not getting up now"..  Will follow.   Gail Vendetti 06/26/2021, 2:41 PM

## 2021-06-26 NOTE — Progress Notes (Addendum)
PROGRESS NOTE    Tami Stone  DTO:[id number] DOB: May 22, 1951 DOA: 06/23/2021 PCP: Gaynelle Cage, MD    Brief Narrative:  71 year old F with PMH of metastatic lung cancer, history of breast cancer, chronic large left-sided pleural effusion, DM-2, anemia, HLD, hypothyroidism, neuropathy and syncope presenting with difficulty walking, dizziness, worsening neuropathic pain, head twitch and right body tightness, and admitted with worsening vasogenic cerebral edema due to worsening metastatic brain lesions as noted on MRI brain.  Neuro oncology consulted and recommended Decadron and Vimpat.  Oncology consulted but patient is not interested in chemotherapy or immunotherapy.  Radiation oncology consulted but the patient is undecided about radiation therapy either.  She is planning to try holistic treatment with "DCO" and "[place] tail".   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  Has Pleurx cath. History of breast cancer Nausea/dizziness -MRI brain with interval enlargement of multiple metastatic lesions and associated vasogenic edema with trace left-to-right shift without hydrocephalus or trapping. -Continue Vimpat and Decadron per neurooncology recommendation -Not interested in chemo or immunotherapy.  Undecided about radiotherapy.  -Pt is planning "DCO and [place] tail" holistic therapy -Palliative medicine consulted -Antiemetics as needed for nausea.  History of Syncope, current syncope ruled out.  However, she describes her  current dizziness as spinning sensation.   -Orthostatic vitals pending   Controlled NIDDM-2 with neuropathy: A1c 5.4%.  On metformin at home.   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  Stable.   Thrombocytosis: Likely reactive -Continue monitoring.  Anemia, normocytic -No evidence of acute blood loss -Suspect secondary to underlying malignancy -2 units of PRBC's ordered by Oncology     DVT prophylaxis: Lovenox subq Code Status: DNR Family Communication: Pt in room, family at bedside  Status is: Inpatient  Remains inpatient appropriate because: Severity of illness    Consultants:  Oncology Palliative Care  Procedures:    Antimicrobials: Anti-infectives (From admission, onward)    None       Subjective: Reports coughing this AM  Objective: Vitals:   06/26/21 1234 06/26/21 1317 06/26/21 1626 06/26/21 1657  BP: 122/86 117/82 134/90 (!) 135/92  Pulse: (!) 108 (!) 107 (!) 105 (!) 108  Resp: 16 15 17 18   Temp: 99.2 F (37.3 C) 98.2 F (36.8 C) 98.9 F (37.2 C) 98.8 F (37.1 C)  TempSrc: Oral Oral Oral Oral  SpO2: 95% 93% 96% 94%  Weight:      Height:        Intake/Output Summary (Last 24 hours) at 06/26/2021 1723 Last data filed at 06/26/2021 1620 Gross per 24 hour  Intake 766.39 ml  Output 1700 ml  Net -933.61 ml   Filed Weights   06/23/21 1558  Weight: 49.9 kg    Examination: General exam: Awake, laying in bed, in nad Respiratory system: Normal respiratory effort, active coughing Cardiovascular system: regular rate, s1, s2 Gastrointestinal system: Soft, nondistended, positive BS Central nervous system: CN2-12 grossly intact, strength intact Extremities: Perfused, no clubbing Skin: Normal skin turgor, no notable  skin lesions seen Psychiatry: Mood normal // no visual hallucinations   Data Reviewed: I have personally reviewed following labs and imaging  studies  CBC: Recent Labs  Lab 06/23/21 1623 06/24/21 0721 06/25/21 0455 06/26/21 0633  WBC 13.3* 9.0 12.7* 12.6*  NEUTROABS 8.8*  --   --  8.7*  HGB 8.8* 8.1* 8.3* 8.4*  HCT 28.8* 26.1* 27.6* 27.5*  MCV 84.0 84.2 85.7 85.9  PLT 759* 656* 743* 299*   Basic Metabolic Panel: Recent Labs  Lab 06/23/21 1623 06/24/21 0721 06/25/21 0455 06/26/21 0633  NA 130* 130* 134* 135  K 4.1 4.4 3.9 4.3  CL 92* 95* 94* 96*  CO2 25 26 26 28   GLUCOSE 113* 104* 163* 124*  BUN 14 14 12 10   CREATININE 0.61 0.51 <0.30* <0.30*  CALCIUM 8.6* 7.9* 8.4* 8.8*  MG  --   --  1.9  --   PHOS  --   --  1.9*  --    GFR: CrCl cannot be calculated (This lab value cannot be used to calculate CrCl because it is not a number: <0.30). Liver Function Tests: Recent Labs  Lab 06/23/21 1623 06/24/21 0721 06/25/21 0455 06/26/21 0633  AST 13* 11*  --  13*  ALT 10 10  --  12  ALKPHOS 106 87  --  82  BILITOT 0.5 0.6  --  0.3  PROT 7.1 6.4*  --  6.6  ALBUMIN 2.6* 2.3* 2.2* 2.4*   No results for input(s): LIPASE, AMYLASE in the last 168 hours. No results for input(s): AMMONIA in the last 168 hours. Coagulation Profile: No results for input(s): INR, PROTIME in the last 168 hours. Cardiac Enzymes: No results for input(s): CKTOTAL, CKMB, CKMBINDEX, TROPONINI in the last 168 hours. BNP (last 3 results) No results for input(s): PROBNP in the last 8760 hours. HbA1C: Recent Labs    06/25/21 0455  HGBA1C 5.4   CBG: Recent Labs  Lab 06/25/21 1722 06/25/21 2029 06/26/21 0735 06/26/21 1126 06/26/21 1643  GLUCAP 163* 163* 137* 209* 142*   Lipid Profile: No results for input(s): CHOL, HDL, LDLCALC, TRIG, CHOLHDL, LDLDIRECT in the last 72 hours. Thyroid Function Tests: No results for input(s): TSH, T4TOTAL, FREET4, T3FREE, THYROIDAB in the last 72 hours. Anemia Panel: Recent Labs    06/25/21 0730  TIBC 146*  IRON 15*   Sepsis Labs: No results for input(s): PROCALCITON, LATICACIDVEN in the last  168 hours.  Recent Results (from the past 240 hour(s))  Resp Panel by RT-PCR (Flu A&B, Covid) Nasopharyngeal Swab     Status: None   Collection Time: 06/23/21  4:20 PM   Specimen: Nasopharyngeal Swab; Nasopharyngeal(NP) swabs in vial transport medium  Result Value Ref Range Status   SARS Coronavirus 2 by RT PCR NEGATIVE NEGATIVE Final    Comment: (NOTE) SARS-CoV-2 target nucleic acids are NOT DETECTED.  The SARS-CoV-2 RNA is generally detectable in upper respiratory specimens during the acute phase of infection. The lowest concentration of SARS-CoV-2 viral copies this assay can detect is 138 copies/mL. A negative result does not preclude SARS-Cov-2 infection and should not be used as the sole basis for treatment or other patient management decisions. A negative result may occur with  improper specimen collection/handling, submission of specimen other than nasopharyngeal swab, presence of viral mutation(s) within the areas targeted by this assay, and inadequate number of viral copies(<138 copies/mL). A negative result must be combined with clinical observations, patient history, and epidemiological information. The expected result is Negative.  Fact Sheet for Patients:  EntrepreneurPulse.com.au  Fact Sheet for Healthcare Providers:  IncredibleEmployment.be  This test is no t yet approved or cleared by the [place] FDA and  has been authorized for detection and/or diagnosis of SARS-CoV-2 by FDA under an Emergency Use Authorization (EUA). This EUA will remain  in effect (meaning this test can be used) for the duration of the COVID-19 declaration under Section 564(b)(1) of the Act, 21 U.S.C.section 360bbb-3(b)(1), unless the authorization is terminated  or revoked sooner.       Influenza A by PCR NEGATIVE NEGATIVE Final   Influenza B by PCR NEGATIVE NEGATIVE Final    Comment: (NOTE) The Xpert Xpress SARS-CoV-2/FLU/RSV plus assay is  intended as an aid in the diagnosis of influenza from Nasopharyngeal swab specimens and should not be used as a sole basis for treatment. Nasal washings and aspirates are unacceptable for Xpert Xpress SARS-CoV-2/FLU/RSV testing.  Fact Sheet for Patients: EntrepreneurPulse.com.au  Fact Sheet for Healthcare Providers: IncredibleEmployment.be  This test is not yet approved or cleared by the [place] FDA and has been authorized for detection and/or diagnosis of SARS-CoV-2 by FDA under an Emergency Use Authorization (EUA). This EUA will remain in effect (meaning this test can be used) for the duration of the COVID-19 declaration under Section 564(b)(1) of the Act, 21 U.S.C. section 360bbb-3(b)(1), unless the authorization is terminated or revoked.  Performed at [facility], [place] [address] [place],  [address]      Radiology Studies: No results found.  Scheduled Meds:  dexamethasone  4 mg Oral Q12H   docusate sodium  100 mg Oral BID   enoxaparin (LOVENOX) injection  40 mg Subcutaneous Q24H   feeding supplement  237 mL Oral BID BM   furosemide  20 mg Intravenous Once   gabapentin  300 mg Oral QHS   insulin aspart  0-9 Units Subcutaneous TID WC   lacosamide  50 mg Oral BID   letrozole  2.5 mg Oral Daily   metFORMIN  500 mg Oral TID AC   multivitamin with minerals  1 tablet Oral Daily   sodium chloride flush  3 mL Intravenous Q12H   thyroid  60 mg Oral Q0600   Continuous Infusions:   LOS: 3 days   Marylu Lund, MD [facility] Pager On Amion  If 7PM-7AM, please contact night-coverage 06/26/2021, 5:23 PM

## 2021-06-26 NOTE — Progress Notes (Signed)
She is feeling quite weak today.  She was seen by Radiation Oncology yesterday.  They recommended whole brain radiation.  She is not excited about this because she does not want to lose her hair.  She was seen by nutrition service.  I do appreciate their help.  I am awaiting to see what her prealbumin is.  Hemoglobin is only 8.4.  I really think that she would benefit from a transfusion.  I talked her about this this morning.  I explained her the benefit of the transfusion.  I think would make her feel stronger.  I think would increase her appetite and help with her overall quality of life.  I told her that transfusions are safe.  I explained that there is no risk of HIV or hepatitis.  I explained that the transfusions probably take about 4 hours or so.  Again, I think the benefit would definitely outweigh the risk.  She would be agreeable to a transfusion.  I still do not have her electrolyte results back yet.  Again, she is focused on her holistic approach to her cancer.  She wants to start taking the [facility].  Again I will have a problems with this.  I am unsure if her husband will bring this in for her.  I realize that this is a tough situation.  Her performance status is not that great.  If we can just try to get her to have little bit more energy that I think she would feel better.  I think a transfusion will help with this.  We will still follow her along.  I know that she is getting wonderful care from all the staff up on [address].  Lattie Haw, MD  Oswaldo Milian 41:10

## 2021-06-26 NOTE — Progress Notes (Signed)
IV removed. Belongs have been collected and placed on a cart, and a wheelchair is ready. Husband at bedside to take the patient home.

## 2021-06-26 NOTE — Progress Notes (Signed)
Discharge instructions provided to pt. Pt to discharge upon completion of blood transfusion. Scripts sent to pharmacy of choice. No immediate questions or concerns at this time.

## 2021-06-27 NOTE — Progress Notes (Signed)
Palliative care brief progress note  Palliative care consult received.  Chart reviewed.  Discussed with Dr. Wyline Copas.  Patient is going to be discharging home after blood transfusion today.  I met briefly this afternoon with Tami Stone and her husband.  We discussed her plan to discharge home today and follow-up with her outpatient holistic practitioner.  She is not interested in conventional chemotherapy and is undecided about consideration for further radiation.  I introduced palliative care as specialized medical care for people living with serious illness. It focuses on providing relief from the symptoms and stress of a serious illness. The goal is to improve quality of life for both the patient and the family.  She feels that this may be beneficial moving forward.  With this in mind, my recommendation is for an outpatient palliative care consult to establish with her and follow her longitudinally.  Micheline Rough, MD [place] [facility] [phone]  No charge note

## 2021-06-28 ENCOUNTER — Telehealth: Payer: Self-pay

## 2021-06-28 ENCOUNTER — Encounter: Payer: Self-pay | Admitting: *Deleted

## 2021-06-28 LAB — TYPE AND SCREEN
ABO/RH(D): O POS
Antibody Screen: NEGATIVE
Unit division: 0
Unit division: 0

## 2021-06-28 LAB — BPAM RBC
Blood Product Expiration Date: 202302032359
Blood Product Expiration Date: 202302032359
ISSUE DATE / TIME: 202301141237
ISSUE DATE / TIME: 202301141636
Unit Type and Rh: 5100
Unit Type and Rh: 5100

## 2021-06-28 NOTE — Telephone Encounter (Signed)
Patient called asking why her chemo pills were on her medication discharge list when she got out of the hospital. Discussed with Dr.Ennever and called patient back, per MD the chemo pills she was on were not working and they interact with her holistic medication she is wanting to take currently. Patient understands and verbalized udnerstanding, she is aware of appt on 1/18 with Gillian Shields. Pt states she might not come in as she is still deciding what to do and will use radiation as a last resort. She will call when she decided about her appt on Wednesday the 18th. MD aware.

## 2021-06-28 NOTE — Progress Notes (Unsigned)
Tami Stone was contacted by telephone to verify understanding of discharge instructions status post their most recent discharge from the hospital on the date:  06/26/21.  Inpatient discharge AVS was re-reviewed with patient, along with cancer center appointments.  Said that she was unaware of follow up and is not interested in treatment at this time.  Advised her to at least make her follow up appointment so that she can discuss wishes with provider. Verification of understanding for oncology specific follow-up was validated using the Teach Back method.    Transportation to appointments were confirmed for the patient as being self/caregiver.  Tami Stone questions were addressed to their satisfaction upon completion of this post discharge follow-up call for outpatient oncology.

## 2021-06-30 ENCOUNTER — Inpatient Hospital Stay (HOSPITAL_COMMUNITY)
Admission: EM | Admit: 2021-06-30 | Discharge: 2021-07-08 | DRG: 180 | Disposition: A | Discharge status: Home Health | Payer: Medicare Other | Attending: Internal Medicine | Admitting: Internal Medicine

## 2021-06-30 ENCOUNTER — Emergency Department (HOSPITAL_COMMUNITY): Payer: Medicare Other

## 2021-06-30 ENCOUNTER — Other Ambulatory Visit: Payer: Self-pay

## 2021-06-30 ENCOUNTER — Inpatient Hospital Stay: Payer: Medicare Other

## 2021-06-30 ENCOUNTER — Telehealth: Payer: Self-pay | Admitting: Pulmonary Disease

## 2021-06-30 ENCOUNTER — Encounter (HOSPITAL_COMMUNITY): Payer: Self-pay | Admitting: Emergency Medicine

## 2021-06-30 ENCOUNTER — Encounter (HOSPITAL_COMMUNITY)
Admission: EM | Disposition: A | Discharge status: Home Health | Payer: Self-pay | Source: Home / Self Care | Attending: Cardiovascular Disease

## 2021-06-30 ENCOUNTER — Telehealth: Payer: Self-pay | Admitting: *Deleted

## 2021-06-30 ENCOUNTER — Inpatient Hospital Stay: Payer: Medicare Other | Admitting: Family

## 2021-06-30 ENCOUNTER — Encounter: Payer: Self-pay | Admitting: *Deleted

## 2021-06-30 DIAGNOSIS — D72829 Elevated white blood cell count, unspecified: Secondary | ICD-10-CM | POA: Diagnosis present

## 2021-06-30 DIAGNOSIS — D75839 Thrombocytosis, unspecified: Secondary | ICD-10-CM | POA: Diagnosis present

## 2021-06-30 DIAGNOSIS — I959 Hypotension, unspecified: Secondary | ICD-10-CM | POA: Diagnosis present

## 2021-06-30 DIAGNOSIS — R000 Tachycardia, unspecified: Secondary | ICD-10-CM | POA: Diagnosis not present

## 2021-06-30 DIAGNOSIS — Z7189 Other specified counseling: Secondary | ICD-10-CM | POA: Diagnosis not present

## 2021-06-30 DIAGNOSIS — Z90710 Acquired absence of both cervix and uterus: Secondary | ICD-10-CM

## 2021-06-30 DIAGNOSIS — C782 Secondary malignant neoplasm of pleura: Secondary | ICD-10-CM | POA: Diagnosis present

## 2021-06-30 DIAGNOSIS — I314 Cardiac tamponade: Secondary | ICD-10-CM | POA: Diagnosis present

## 2021-06-30 DIAGNOSIS — Z515 Encounter for palliative care: Secondary | ICD-10-CM

## 2021-06-30 DIAGNOSIS — E440 Moderate protein-calorie malnutrition: Secondary | ICD-10-CM | POA: Diagnosis present

## 2021-06-30 DIAGNOSIS — J90 Pleural effusion, not elsewhere classified: Secondary | ICD-10-CM

## 2021-06-30 DIAGNOSIS — I3131 Malignant pericardial effusion in diseases classified elsewhere: Secondary | ICD-10-CM | POA: Diagnosis present

## 2021-06-30 DIAGNOSIS — C7931 Secondary malignant neoplasm of brain: Secondary | ICD-10-CM | POA: Diagnosis present

## 2021-06-30 DIAGNOSIS — Z20822 Contact with and (suspected) exposure to covid-19: Secondary | ICD-10-CM | POA: Diagnosis present

## 2021-06-30 DIAGNOSIS — Z853 Personal history of malignant neoplasm of breast: Secondary | ICD-10-CM

## 2021-06-30 DIAGNOSIS — Z4682 Encounter for fitting and adjustment of non-vascular catheter: Secondary | ICD-10-CM | POA: Diagnosis not present

## 2021-06-30 DIAGNOSIS — E861 Hypovolemia: Secondary | ICD-10-CM | POA: Diagnosis present

## 2021-06-30 DIAGNOSIS — K5900 Constipation, unspecified: Secondary | ICD-10-CM | POA: Diagnosis present

## 2021-06-30 DIAGNOSIS — C7951 Secondary malignant neoplasm of bone: Secondary | ICD-10-CM | POA: Diagnosis present

## 2021-06-30 DIAGNOSIS — Z7984 Long term (current) use of oral hypoglycemic drugs: Secondary | ICD-10-CM

## 2021-06-30 DIAGNOSIS — Z79899 Other long term (current) drug therapy: Secondary | ICD-10-CM

## 2021-06-30 DIAGNOSIS — E1140 Type 2 diabetes mellitus with diabetic neuropathy, unspecified: Secondary | ICD-10-CM | POA: Diagnosis present

## 2021-06-30 DIAGNOSIS — D630 Anemia in neoplastic disease: Secondary | ICD-10-CM | POA: Diagnosis present

## 2021-06-30 DIAGNOSIS — R64 Cachexia: Secondary | ICD-10-CM | POA: Diagnosis present

## 2021-06-30 DIAGNOSIS — E7800 Pure hypercholesterolemia, unspecified: Secondary | ICD-10-CM | POA: Diagnosis present

## 2021-06-30 DIAGNOSIS — K222 Esophageal obstruction: Secondary | ICD-10-CM | POA: Diagnosis present

## 2021-06-30 DIAGNOSIS — Z6820 Body mass index (BMI) 20.0-20.9, adult: Secondary | ICD-10-CM

## 2021-06-30 DIAGNOSIS — Z79890 Hormone replacement therapy (postmenopausal): Secondary | ICD-10-CM

## 2021-06-30 DIAGNOSIS — E871 Hypo-osmolality and hyponatremia: Secondary | ICD-10-CM | POA: Diagnosis not present

## 2021-06-30 DIAGNOSIS — I471 Supraventricular tachycardia: Secondary | ICD-10-CM | POA: Diagnosis not present

## 2021-06-30 DIAGNOSIS — G936 Cerebral edema: Secondary | ICD-10-CM | POA: Diagnosis present

## 2021-06-30 DIAGNOSIS — R1310 Dysphagia, unspecified: Secondary | ICD-10-CM | POA: Diagnosis present

## 2021-06-30 DIAGNOSIS — D649 Anemia, unspecified: Secondary | ICD-10-CM | POA: Diagnosis present

## 2021-06-30 DIAGNOSIS — I3139 Other pericardial effusion (noninflammatory): Secondary | ICD-10-CM

## 2021-06-30 DIAGNOSIS — I4719 Other supraventricular tachycardia: Secondary | ICD-10-CM

## 2021-06-30 DIAGNOSIS — E119 Type 2 diabetes mellitus without complications: Secondary | ICD-10-CM

## 2021-06-30 DIAGNOSIS — R0902 Hypoxemia: Secondary | ICD-10-CM | POA: Diagnosis present

## 2021-06-30 DIAGNOSIS — Z66 Do not resuscitate: Secondary | ICD-10-CM | POA: Diagnosis not present

## 2021-06-30 DIAGNOSIS — Z803 Family history of malignant neoplasm of breast: Secondary | ICD-10-CM

## 2021-06-30 DIAGNOSIS — E039 Hypothyroidism, unspecified: Secondary | ICD-10-CM | POA: Diagnosis present

## 2021-06-30 DIAGNOSIS — C3490 Malignant neoplasm of unspecified part of unspecified bronchus or lung: Secondary | ICD-10-CM | POA: Diagnosis present

## 2021-06-30 DIAGNOSIS — C3491 Malignant neoplasm of unspecified part of right bronchus or lung: Secondary | ICD-10-CM

## 2021-06-30 DIAGNOSIS — J910 Malignant pleural effusion: Secondary | ICD-10-CM | POA: Diagnosis present

## 2021-06-30 HISTORY — PX: PERICARDIOCENTESIS: CATH118255

## 2021-06-30 LAB — BASIC METABOLIC PANEL
Anion gap: 11 (ref 5–15)
BUN: 31 mg/dL — ABNORMAL HIGH (ref 8–23)
CO2: 26 mmol/L (ref 22–32)
Calcium: 8.4 mg/dL — ABNORMAL LOW (ref 8.9–10.3)
Chloride: 92 mmol/L — ABNORMAL LOW (ref 98–111)
Creatinine, Ser: 0.78 mg/dL (ref 0.44–1.00)
GFR, Estimated: >60 mL/min (ref >60)
Glucose, Bld: 116 mg/dL — ABNORMAL HIGH (ref 70–99)
Potassium: 4.3 mmol/L (ref 3.5–5.1)
Sodium: 129 mmol/L — ABNORMAL LOW (ref 135–145)

## 2021-06-30 LAB — ECHOCARDIOGRAM COMPLETE
Area-P 1/2: 4.86 cm2
Height: 62 in
Weight: 1760 [oz_av]

## 2021-06-30 LAB — BODY FLUID CELL COUNT WITH DIFFERENTIAL
Eos, Fluid: 1 %
Lymphs, Fluid: 1 %
Monocyte-Macrophage-Serous Fluid: 87 % (ref 50–90)
Neutrophil Count, Fluid: 11 % (ref 0–25)
Total Nucleated Cell Count, Fluid: 656 cu mm (ref 0–1000)

## 2021-06-30 LAB — GLUCOSE, CAPILLARY: Glucose-Capillary: 94 mg/dL (ref 70–99)

## 2021-06-30 LAB — CBC WITH DIFFERENTIAL/PLATELET
Abs Immature Granulocytes: 0.14 10*3/uL — ABNORMAL HIGH (ref 0.00–0.07)
Basophils Absolute: 0 10*3/uL (ref 0.0–0.1)
Basophils Relative: 0 %
Eosinophils Absolute: 0.1 10*3/uL (ref 0.0–0.5)
Eosinophils Relative: 1 %
HCT: 34.9 % — ABNORMAL LOW (ref 36.0–46.0)
Hemoglobin: 10.9 g/dL — ABNORMAL LOW (ref 12.0–15.0)
Immature Granulocytes: 1 %
Lymphocytes Relative: 6 %
Lymphs Abs: 1 10*3/uL (ref 0.7–4.0)
MCH: 27.1 pg (ref 26.0–34.0)
MCHC: 31.2 g/dL (ref 30.0–36.0)
MCV: 86.8 fL (ref 80.0–100.0)
Monocytes Absolute: 1.2 10*3/uL — ABNORMAL HIGH (ref 0.1–1.0)
Monocytes Relative: 7 %
Neutro Abs: 14.1 10*3/uL — ABNORMAL HIGH (ref 1.7–7.7)
Neutrophils Relative %: 85 %
Platelets: 686 10*3/uL — ABNORMAL HIGH (ref 150–400)
RBC: 4.02 MIL/uL (ref 3.87–5.11)
RDW: 18.3 % — ABNORMAL HIGH (ref 11.5–15.5)
WBC: 16.6 10*3/uL — ABNORMAL HIGH (ref 4.0–10.5)
nRBC: 0 % (ref 0.0–0.2)

## 2021-06-30 LAB — ECHOCARDIOGRAM LIMITED
Height: 62 in
Weight: 1760 oz

## 2021-06-30 LAB — GRAM STAIN

## 2021-06-30 LAB — RESP PANEL BY RT-PCR (FLU A&B, COVID) ARPGX2
Influenza A by PCR: NEGATIVE
Influenza B by PCR: NEGATIVE
SARS Coronavirus 2 by RT PCR: NEGATIVE

## 2021-06-30 LAB — PROCALCITONIN: Procalcitonin: 0.28 ng/mL

## 2021-06-30 LAB — MRSA NEXT GEN BY PCR, NASAL: MRSA by PCR Next Gen: NOT DETECTED

## 2021-06-30 LAB — TROPONIN I (HIGH SENSITIVITY): Troponin I (High Sensitivity): 3 ng/L (ref <18)

## 2021-06-30 LAB — BRAIN NATRIURETIC PEPTIDE: B Natriuretic Peptide: 55.9 pg/mL (ref 0.0–100.0)

## 2021-06-30 LAB — LACTIC ACID, PLASMA: Lactic Acid, Venous: 2.4 mmol/L (ref 0.5–1.9)

## 2021-06-30 IMAGING — CR DG CHEST 2V
2 series · 2 of 2 positions shown · non-contrast
Comparison: [DATE]

CLINICAL DATA: Short of breath and weakness.  Lung cancer.

EXAM:
CHEST - 2 VIEW

[x chest ap]
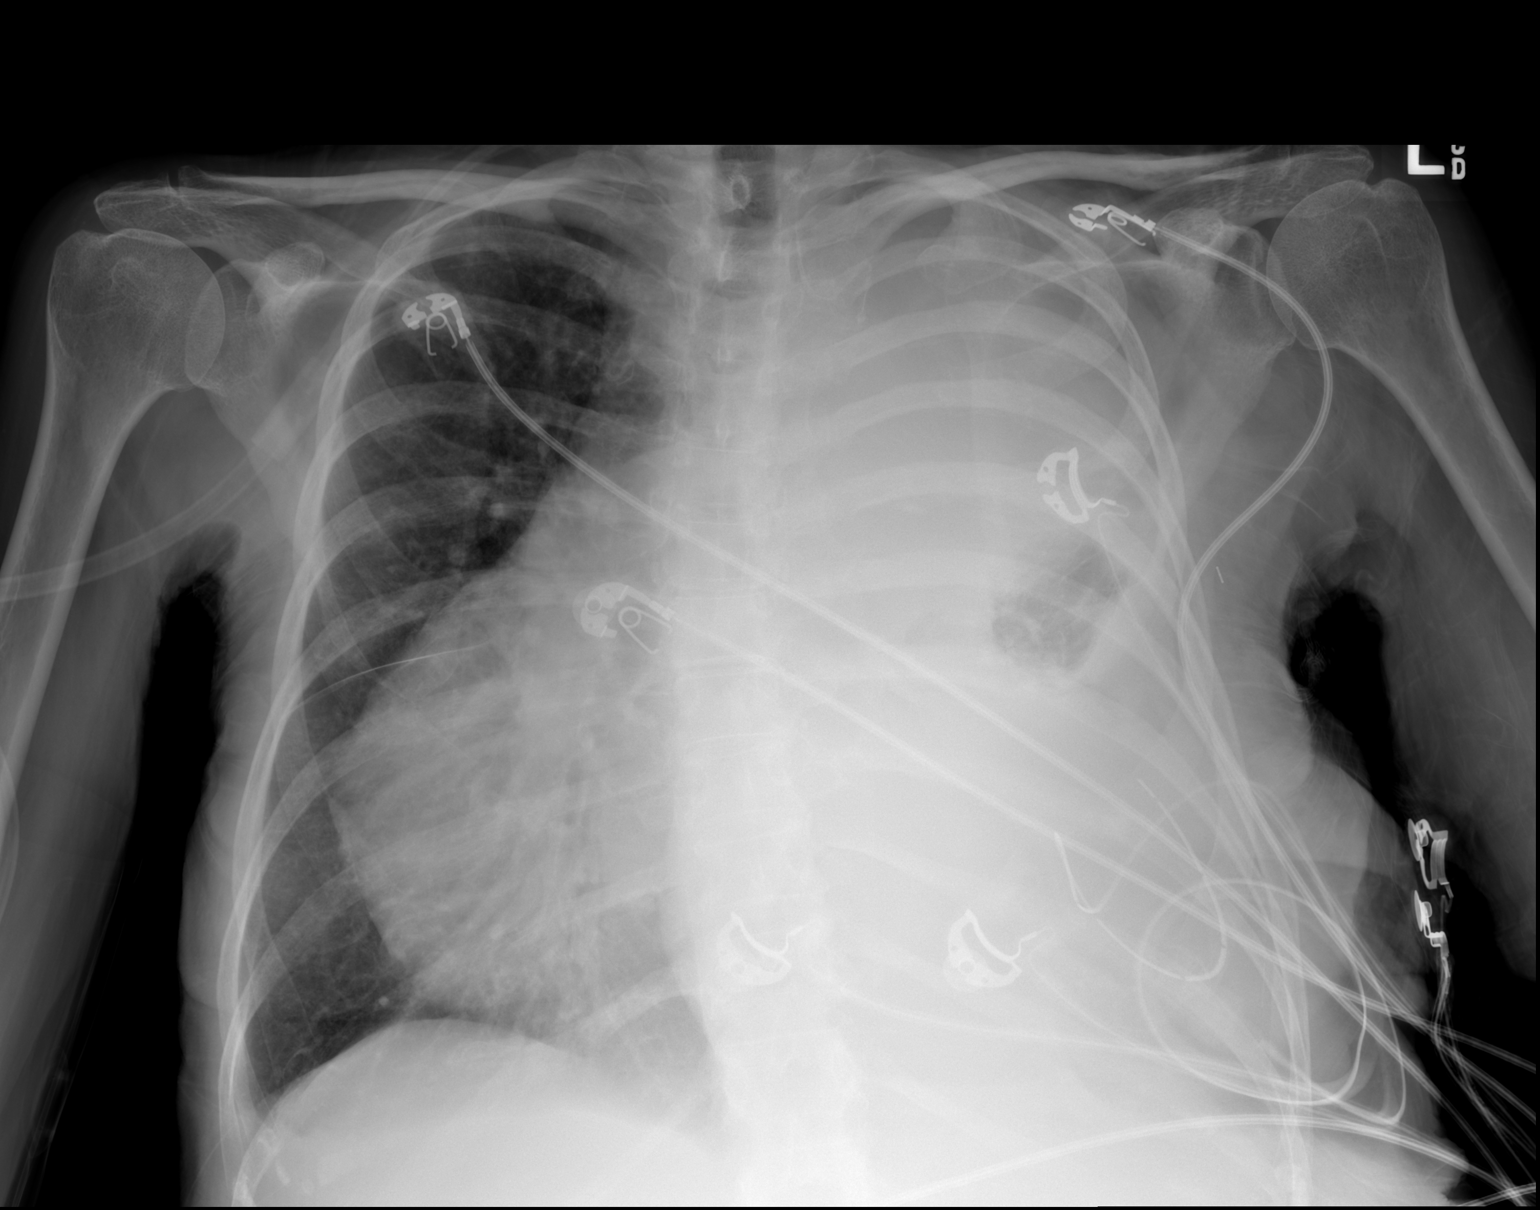

[w chest lat]
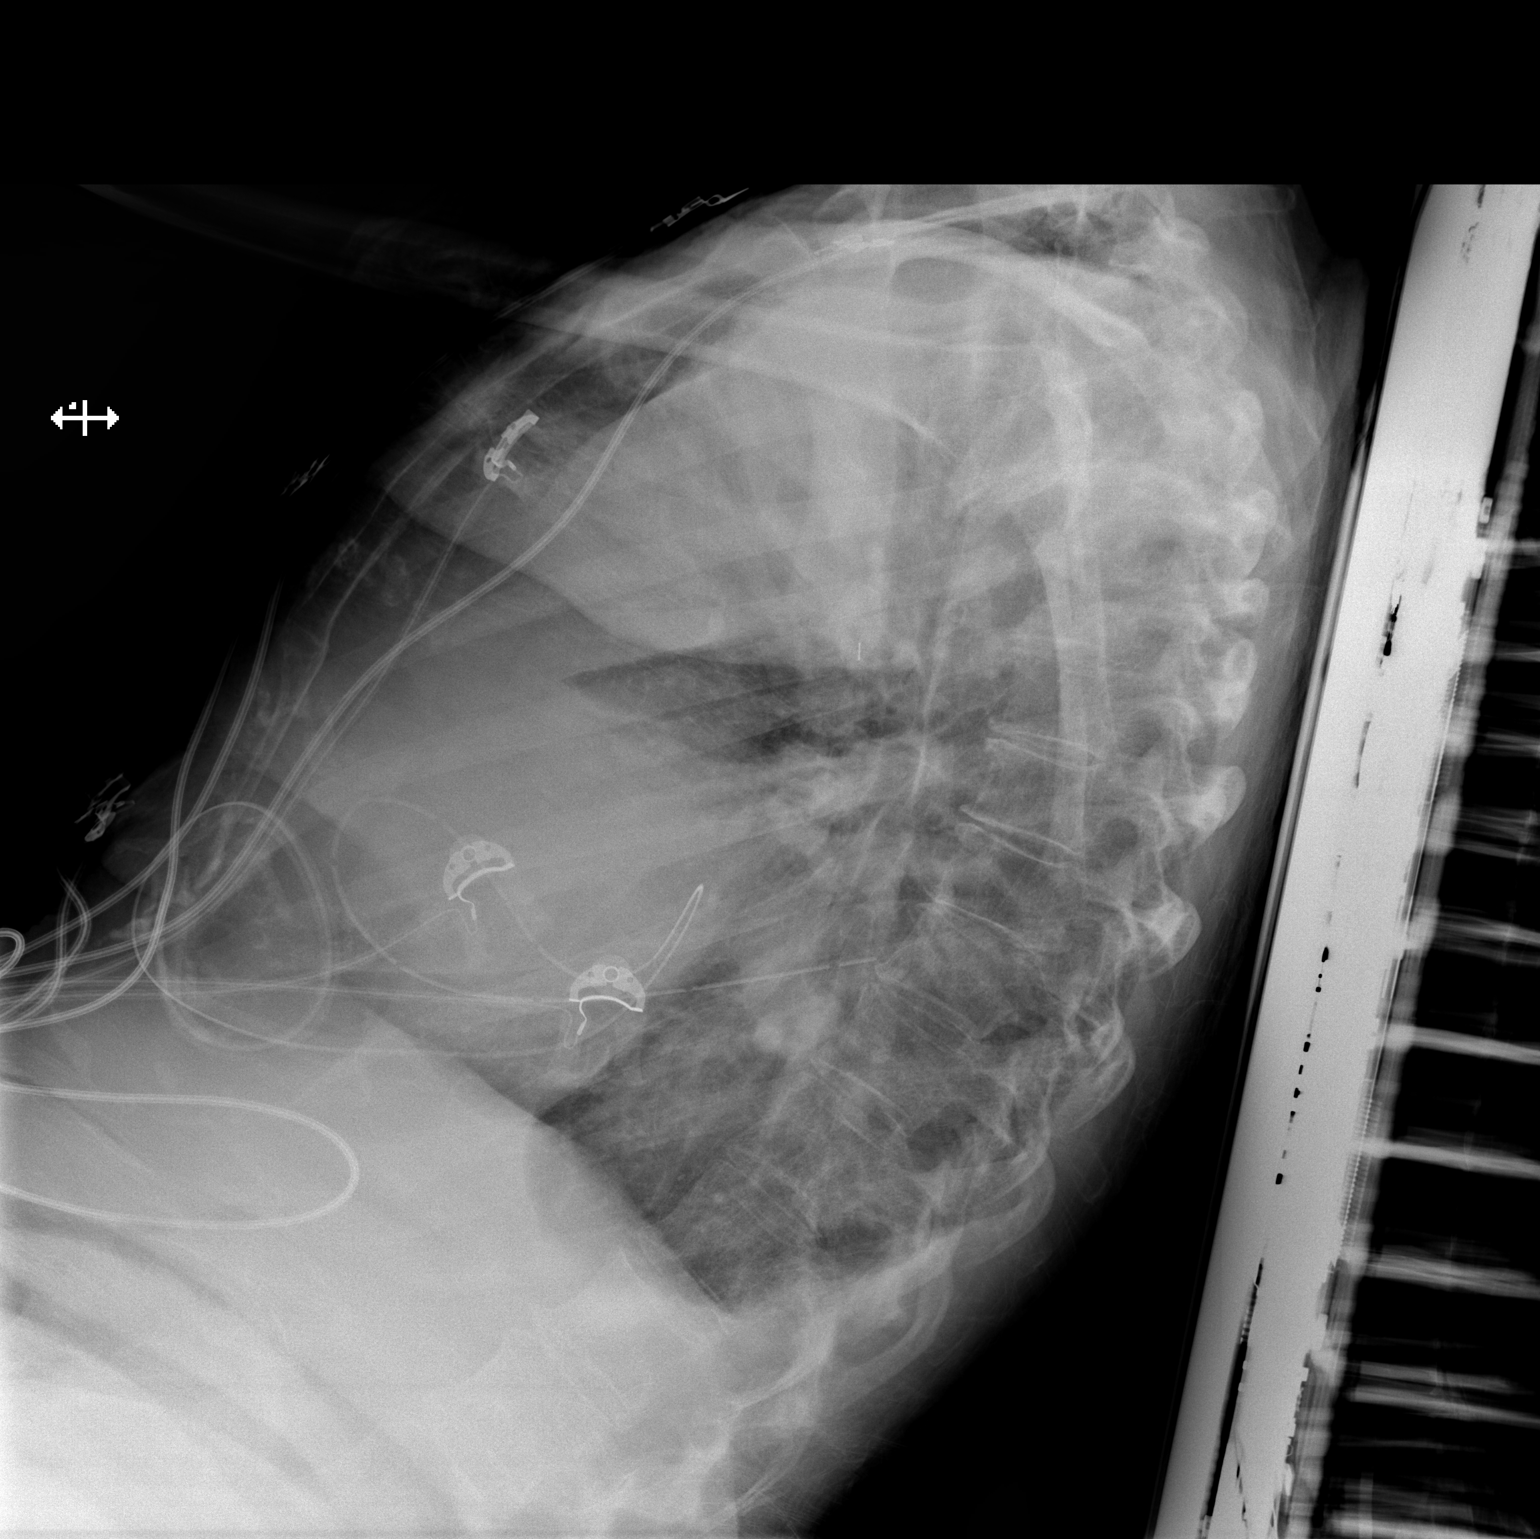

[2 of 2 positions shown; findings below may reference images not displayed]

FINDINGS: Left basilar chest tube in place. No pneumothorax. Near complete
opacification left chest due to effusion and tumor and collapse left
lung. Small amount of aerated lung on the left. Heart is displaced
to the right. Possible pericardial effusion.

Minimal right pleural effusion.  Right lung otherwise clear.
IMPRESSION: Progressive opacification left chest due to fluid, tumor, and
collapse. Left chest tube in place. No pneumothorax

Heart displaced to the right.  Question pericardial effusion

## 2021-06-30 SURGERY — PERICARDIOCENTESIS
Anesthesia: LOCAL

## 2021-06-30 MED ORDER — FENTANYL CITRATE (PF) 100 MCG/2ML IJ SOLN
INTRAMUSCULAR | Status: AC
  Filled 2021-06-30: qty 2

## 2021-06-30 MED ORDER — LACOSAMIDE 50 MG PO TABS: 50.0000 mg | ORAL_TABLET | Freq: Two times a day (BID) | ORAL | Status: DC

## 2021-06-30 MED ORDER — LIDOCAINE HCL (PF) 1 % IJ SOLN
INTRAMUSCULAR | Status: DC | PRN
  Administered 2021-06-30: 20 mL

## 2021-06-30 MED ORDER — LETROZOLE 2.5 MG PO TABS
2.5000 mg | ORAL_TABLET | Freq: Every day | ORAL | Status: DC
  Administered 2021-07-01 – 2021-07-08 (×8): 2.5 mg via ORAL
  Filled 2021-06-30 (×8): qty 1

## 2021-06-30 MED ORDER — KETOROLAC TROMETHAMINE 15 MG/ML IJ SOLN
7.5000 mg | Freq: Three times a day (TID) | INTRAMUSCULAR | Status: AC | PRN
  Administered 2021-06-30 – 2021-07-02 (×5): 7.5 mg via INTRAVENOUS
  Filled 2021-06-30 (×4): qty 1

## 2021-06-30 MED ORDER — GABAPENTIN 300 MG PO CAPS
300.0000 mg | ORAL_CAPSULE | Freq: Every day | ORAL | Status: DC
  Administered 2021-06-30 – 2021-07-07 (×8): 300 mg via ORAL
  Filled 2021-06-30 (×8): qty 1

## 2021-06-30 MED ORDER — THYROID 60 MG PO TABS
60.0000 mg | ORAL_TABLET | Freq: Every day | ORAL | Status: DC
  Administered 2021-07-01 – 2021-07-08 (×8): 60 mg via ORAL
  Filled 2021-06-30 (×8): qty 1

## 2021-06-30 MED ORDER — SODIUM CHLORIDE (PF) 0.9 % IJ SOLN
INTRAMUSCULAR | Status: AC
  Filled 2021-06-30: qty 50

## 2021-06-30 MED ORDER — HEPARIN (PORCINE) IN NACL 1000-0.9 UT/500ML-% IV SOLN
INTRAVENOUS | Status: DC | PRN
  Administered 2021-06-30: 500 mL

## 2021-06-30 MED ORDER — IBUPROFEN 200 MG PO TABS
200.0000 mg | ORAL_TABLET | Freq: Four times a day (QID) | ORAL | Status: DC | PRN
  Administered 2021-06-30 – 2021-07-04 (×4): 200 mg via ORAL
  Filled 2021-06-30 (×5): qty 1

## 2021-06-30 MED ORDER — ACETAMINOPHEN 325 MG PO TABS
650.0000 mg | ORAL_TABLET | ORAL | Status: DC | PRN
  Administered 2021-07-06 (×2): 650 mg via ORAL
  Filled 2021-06-30 (×3): qty 2

## 2021-06-30 MED ORDER — DEXAMETHASONE 4 MG PO TABS
4.0000 mg | ORAL_TABLET | Freq: Two times a day (BID) | ORAL | Status: DC
  Administered 2021-06-30 – 2021-07-08 (×16): 4 mg via ORAL
  Filled 2021-06-30 (×19): qty 1

## 2021-06-30 MED ORDER — FENTANYL CITRATE PF 50 MCG/ML IJ SOSY
50.0000 ug | PREFILLED_SYRINGE | INTRAMUSCULAR | Status: DC | PRN
  Administered 2021-07-01 – 2021-07-08 (×9): 50 ug via INTRAVENOUS
  Filled 2021-06-30 (×9): qty 1

## 2021-06-30 MED ORDER — POLYETHYLENE GLYCOL 3350 17 G PO PACK
17.0000 g | PACK | Freq: Every day | ORAL | Status: DC
Start: 2021-07-01 — End: 2021-07-07
  Administered 2021-07-06: 11:00:00 17 g via ORAL
  Filled 2021-06-30 (×3): qty 1

## 2021-06-30 MED ORDER — ZINC 100 MG PO TABS: 100.0000 mg | ORAL_TABLET | Freq: Every day | ORAL | Status: DC

## 2021-06-30 MED ORDER — ZINC SULFATE 220 (50 ZN) MG PO CAPS
220.0000 mg | ORAL_CAPSULE | Freq: Every day | ORAL | Status: DC
  Administered 2021-07-01 – 2021-07-08 (×8): 220 mg via ORAL
  Filled 2021-06-30 (×8): qty 1

## 2021-06-30 MED ORDER — IODINE (KELP) 0.15 MG PO TABS: ORAL_TABLET | Freq: Every morning | ORAL | Status: DC

## 2021-06-30 MED ORDER — HEPARIN (PORCINE) IN NACL 1000-0.9 UT/500ML-% IV SOLN
INTRAVENOUS | Status: AC
  Filled 2021-06-30: qty 1000

## 2021-06-30 MED ORDER — ONDANSETRON HCL 4 MG/2ML IJ SOLN
4.0000 mg | Freq: Four times a day (QID) | INTRAMUSCULAR | Status: DC | PRN
  Administered 2021-07-05 – 2021-07-08 (×3): 4 mg via INTRAVENOUS
  Filled 2021-06-30 (×3): qty 2

## 2021-06-30 MED ORDER — ENOXAPARIN SODIUM 60 MG/0.6ML IJ SOSY
60.0000 mg | PREFILLED_SYRINGE | INTRAMUSCULAR | Status: DC
  Administered 2021-07-01: 60 mg via SUBCUTANEOUS
  Filled 2021-06-30: qty 0.6

## 2021-06-30 MED ORDER — ALBUTEROL SULFATE HFA 108 (90 BASE) MCG/ACT IN AERS
2.0000 | INHALATION_SPRAY | RESPIRATORY_TRACT | Status: DC | PRN
  Filled 2021-06-30: qty 6.7

## 2021-06-30 MED ORDER — INSULIN ASPART 100 UNIT/ML IJ SOLN: 0.0000 [IU] | Freq: Three times a day (TID) | INTRAMUSCULAR | Status: DC

## 2021-06-30 MED ORDER — DOCUSATE SODIUM 100 MG PO CAPS
100.0000 mg | ORAL_CAPSULE | Freq: Two times a day (BID) | ORAL | Status: DC
  Administered 2021-07-01 – 2021-07-08 (×11): 100 mg via ORAL
  Filled 2021-06-30 (×13): qty 1

## 2021-06-30 MED ORDER — INSULIN ASPART 100 UNIT/ML IJ SOLN
0.0000 [IU] | Freq: Three times a day (TID) | INTRAMUSCULAR | Status: DC
  Administered 2021-07-01: 3 [IU] via SUBCUTANEOUS
  Administered 2021-07-01 – 2021-07-03 (×3): 1 [IU] via SUBCUTANEOUS
  Administered 2021-07-03: 3 [IU] via SUBCUTANEOUS
  Administered 2021-07-04: 1 [IU] via SUBCUTANEOUS
  Administered 2021-07-04: 2 [IU] via SUBCUTANEOUS
  Administered 2021-07-04: 1 [IU] via SUBCUTANEOUS
  Administered 2021-07-05: 2 [IU] via SUBCUTANEOUS
  Administered 2021-07-06: 13:00:00 1 [IU] via SUBCUTANEOUS
  Administered 2021-07-06: 17:00:00 2 [IU] via SUBCUTANEOUS
  Administered 2021-07-07: 17:00:00 3 [IU] via SUBCUTANEOUS

## 2021-06-30 MED ORDER — NITROGLYCERIN 0.4 MG SL SUBL: 0.4000 mg | SUBLINGUAL_TABLET | SUBLINGUAL | Status: DC | PRN

## 2021-06-30 MED ORDER — LIDOCAINE HCL (PF) 1 % IJ SOLN
INTRAMUSCULAR | Status: AC
  Filled 2021-06-30: qty 30

## 2021-06-30 MED ORDER — FENTANYL CITRATE (PF) 100 MCG/2ML IJ SOLN
INTRAMUSCULAR | Status: DC | PRN
  Administered 2021-06-30 (×2): 25 ug via INTRAVENOUS

## 2021-06-30 MED ORDER — IOHEXOL 350 MG/ML SOLN
100.0000 mL | Freq: Once | INTRAVENOUS | Status: AC | PRN
  Administered 2021-06-30: 69 mL via INTRAVENOUS

## 2021-06-30 SURGICAL SUPPLY — 3 items
PINNACLE LONG 5F 25CM (SHEATH) ×2
SHEATH INTROD PINNACLE 5F 25CM (SHEATH) IMPLANT
TRAY PERICARDIOCENTESIS 6FX60 (TRAY / TRAY PROCEDURE) ×1 IMPLANT

## 2021-06-30 NOTE — ED Triage Notes (Signed)
Pt had episode of SOB in shower. No dizziness, no Chest pain. Stage 4 Lung cancer pt. O2 80% non O2 at home. Lung sounds clear, diminished in all fields. Weak radials in 110s, O2 went to up to 88 on nonrebreather 10 L. BP: 90/58, after 150 L. bolus. Currently Tachy at 102. 20g in [place].

## 2021-06-30 NOTE — Consult Note (Addendum)
Initial Consult note   Tami Stone YBO:[id number] DOB: [birth year] DOA: 06/30/2021  PCP: Gaynelle Cage, MD Patient coming from: home Consult requested by cardiology  I have personally briefly reviewed patient's old medical records in [place]  Chief Complaint: medical management   HPI: Tami Stone is a 71 y.o. 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   Past Medical History:  Diagnosis Date   Diabetes ([place])    Does not check bs at home   Dyspnea    High cholesterol    Lung cancer ([place])    04/2020    Past Surgical History:  Procedure Laterality Date   ABDOMINAL HYSTERECTOMY     BREAST BIOPSY Right    BRONCHIAL BIOPSY  04/30/2020   Procedure: BRONCHIAL BIOPSIES;  Surgeon: Collene Gobble, MD;  Location: [facility];  Service: Cardiopulmonary;;   BRONCHIAL BRUSHINGS  04/30/2020   Procedure: BRONCHIAL BRUSHINGS;  Surgeon: Collene Gobble, MD;  Location: [facility];  Service: Cardiopulmonary;;   BRONCHIAL BRUSHINGS  01/20/2021   Procedure: BRONCHIAL BRUSHINGS;  Surgeon: Laurin Coder, MD;  Location: [facility];  Service: Endoscopy;;   BRONCHIAL WASHINGS  01/20/2021   Procedure: BRONCHIAL WASHINGS;  Surgeon: Laurin Coder, MD;  Location: [facility];  Service: Endoscopy;;   CHEST  TUBE INSERTION Left 05/28/2021   Procedure: INSERTION PLEURAL DRAINAGE CATHETER;  Surgeon: [name], DO;  Location: [facility];  Service: Pulmonary;  Laterality: Left;  Prior to bronchoscopy   HEMOSTASIS CONTROL  04/30/2020   Procedure: HEMOSTASIS CONTROL;  Surgeon: Collene Gobble, MD;  Location: [facility];  Service: Cardiopulmonary;;   IR THORACENTESIS ASP PLEURAL SPACE W/IMG GUIDE  04/15/2021   IR THORACENTESIS ASP PLEURAL SPACE W/IMG GUIDE  05/03/2021   IR THORACENTESIS ASP PLEURAL SPACE W/IMG GUIDE  05/11/2021   TUBAL LIGATION     VIDEO BRONCHOSCOPY N/A 04/30/2020   Procedure: VIDEO BRONCHOSCOPY WITH FLUORO;  Surgeon: Collene Gobble, MD;  Location: [facility];  Service: Cardiopulmonary;  Laterality: N/A;   VIDEO BRONCHOSCOPY N/A 01/20/2021   Procedure: VIDEO BRONCHOSCOPY WITHOUT FLUORO;  Surgeon: Laurin Coder, MD;  [facility];  Service: Endoscopy;  Laterality: N/A;   VIDEO BRONCHOSCOPY Left 05/28/2021   Procedure: VIDEO BRONCHOSCOPY WITHOUT FLUORO;  Surgeon: [name], DO;  Location: [facility];  Service: Pulmonary;  Laterality: Left;    Social History  reports that she has never smoked. She has never used smokeless tobacco. She reports that she does not currently use alcohol. She reports that she does not use drugs.  No Known Allergies  Family History  Problem Relation Age of Onset   Breast cancer Sister        half sister on maternal side   Colon cancer Neg Hx    Esophageal cancer Neg Hx     Prior to Admission medications   Medication  Sig Start Date End Date Taking? Authorizing Provider  Black Pepper-Turmeric (TURMERIC PLUS BLACK PEPPER EXT PO) Take 1 capsule by mouth in the morning, at noon, and at bedtime.   Yes [provider]  Cholecalciferol (VITAMIN D) 125 MCG (5000 UT) CAPS Take 10,000 Units by mouth every other day.   Yes [provider]  Coenzyme Q10 (CO Q 10 PO) Take 300 mg by mouth at bedtime.   Yes  [provider]  DANDELION PO Take 1 tablet by mouth daily with supper.   Yes [provider]  dexamethasone (DECADRON) 4 MG tablet Take 1 tablet (4 mg total) by mouth every 12 (twelve) hours. 06/26/21 07/26/21 Yes Donne Hazel, MD  docusate sodium (COLACE) 100 MG capsule Take 1 capsule (100 mg total) by mouth 2 (two) times daily. 06/26/21 07/26/21 Yes Donne Hazel, MD  Flavoring Agent (APRICOT FLAVOR) POWD Take 2 capsules by mouth daily. Apricot Seed Supplement   Yes [provider]  gabapentin (NEURONTIN) 300 MG capsule Take 1 capsule (300 mg total) by mouth at bedtime. 06/26/21 07/26/21 Yes Donne Hazel, MD  ibuprofen (ADVIL) 200 MG tablet Take 400 mg by mouth every 6 (six) hours as needed for mild pain.   Yes [provider]  IODINE, KELP, PO Take 1 capsule by mouth in the morning.   Yes [provider]  letrozole (FEMARA) 2.5 MG tablet Take 1 tablet (2.5 mg total) by mouth daily. 04/28/21  Yes Volanda Napoleon, MD  metFORMIN (GLUCOPHAGE) 500 MG tablet Take 1 tablet (500 mg total) by mouth 3 (three) times daily. 03/03/21  Yes Volanda Napoleon, MD  NP THYROID 60 MG tablet Take 60 mg by mouth daily. 02/24/20  Yes [provider]  polyethylene glycol (MIRALAX / GLYCOLAX) 17 g packet Take 17 g by mouth daily. 01/23/21  Yes Georgette Shell, MD  Prenatal Vit-Fe Fumarate-FA (MULTIVITAMIN-PRENATAL) 27-0.8 MG TABS tablet Take 1 tablet by mouth daily at 12 noon.   Yes [provider]  Zinc 100 MG TABS Take 100 mg by mouth daily.   Yes [provider]  lacosamide (VIMPAT) 50 MG TABS tablet Take 1 tablet (50 mg total) by mouth 2 (two) times daily. Patient not taking: Reported on 06/30/2021 06/26/21 07/26/21  Donne Hazel, MD    Physical Exam: Vitals:   06/30/21 1345 06/30/21 1533 06/30/21 1600 06/30/21 1646  BP: 114/82 108/80 99/78   Pulse: (!) 109 (!) 105 (!) 109   Resp: 16 18 16    Temp:      SpO2: 98% 97% 97% 99%   Weight:      Height:        Constitutional: thin, frail, NAD Eyes: PERRL, lids and conjunctivae normal ENMT: Mucous membranes are moist.  Respiratory: diminished on left side, no wheezing, no crackles. Normal respiratory effort. No accessory muscle use.  Cardiovascular: Regular rate and rhythm,  No extremity edema. 2+ pedal pulses. No carotid bruits.  Abdomen: no tenderness, not distended, Bowel sounds positive.  Musculoskeletal: no clubbing / cyanosis. No joint deformity upper and lower extremities. Good ROM, no contractures. Normal muscle tone.  Skin: no rashes, lesions, ulcers. No induration Neurologic: CN 2-12 grossly intact. Sensation intact, Strength 5/5 in all 4.  Psychiatric: Normal judgment and insight. Alert and oriented x 3. Normal mood.    Labs on Admission: I have personally reviewed following labs and imaging studies  CBC: Recent Labs  Lab 06/24/21 0721 06/25/21 0455 06/26/21 8850 06/30/21 1103  WBC 9.0 12.7* 12.6* 16.6*  NEUTROABS  --   --  8.7* 14.1*  HGB 8.1* 8.3* 8.4* 10.9*  HCT 26.1* 27.6* 27.5* 34.9*  MCV 84.2 85.7 85.9 86.8  PLT 656* 743* 729* 686*    Basic Metabolic Panel: Recent Labs  Lab 06/24/21 0721 06/25/21 0455 06/26/21 0633 06/30/21 1103  NA 130* 134* 135 129*  K 4.4 3.9 4.3 4.3  CL 95* 94* 96* 92*  CO2 26 26 28 26   GLUCOSE 104* 163* 124* 116*  BUN 14 12 10  31*  CREATININE 0.51 <0.30* <0.30* 0.78  CALCIUM 7.9* 8.4* 8.8* 8.4*  MG  --  1.9  --   --   PHOS  --  1.9*  --   --     GFR: Estimated Creatinine Clearance: 50.8 mL/min (by C-G formula based on SCr of 0.78 mg/dL).  Liver Function Tests: Recent Labs  Lab 06/24/21 0721 06/25/21 0455 06/26/21 0633  AST 11*  --  13*  ALT 10  --  12  ALKPHOS 87  --  82  BILITOT 0.6  --  0.3  PROT 6.4*  --  6.6  ALBUMIN 2.3* 2.2* 2.4*    Urine analysis:    Component Value Date/Time   COLORURINE STRAW (A) 06/24/2021 0642   APPEARANCEUR CLEAR 06/24/2021 0642   LABSPEC 1.009  06/24/2021 0642   PHURINE 6.0 06/24/2021 [place] 06/24/2021 0642   HGBUR NEGATIVE 06/24/2021 0642   BILIRUBINUR NEGATIVE 06/24/2021 0642   KETONESUR 20 (A) 06/24/2021 0642   PROTEINUR NEGATIVE 06/24/2021 0642   NITRITE NEGATIVE 06/24/2021 0642   LEUKOCYTESUR NEGATIVE 06/24/2021 0642    Radiological Exams on Admission: DG Chest 2 View  Result Date: 06/30/2021 CLINICAL DATA:  Short of breath and weakness.  Lung cancer. EXAM: CHEST - 2 VIEW COMPARISON:  06/23/2021 FINDINGS: Left basilar chest tube in place. No pneumothorax. Near complete opacification left chest due to effusion and tumor and collapse left lung. Small amount of aerated lung on the left. Heart is displaced to the right. Possible pericardial effusion. Minimal right pleural effusion.  Right lung otherwise clear. IMPRESSION: Progressive opacification left chest due to fluid, tumor, and collapse. Left chest tube in place. No pneumothorax Heart displaced to the right.  Question pericardial effusion Electronically Signed   By: Franchot Gallo M.D.   On: 06/30/2021 11:22   CT Angio Chest PE W and/or Wo Contrast  Result Date: 06/30/2021 CLINICAL DATA:  Episode of shortness of breath. Known stage IV lung cancer. EXAM: CT ANGIOGRAPHY CHEST WITH CONTRAST TECHNIQUE: Multidetector CT imaging of the chest was performed using the standard protocol during bolus administration of intravenous contrast. Multiplanar CT image reconstructions and MIPs were obtained to evaluate the vascular anatomy. RADIATION DOSE REDUCTION: This exam was performed according to the departmental dose-optimization program which includes automated exposure control, adjustment of the mA and/or kV according to patient size and/or use of iterative reconstruction technique. CONTRAST:  28mL OMNIPAQUE IOHEXOL 350 MG/ML SOLN COMPARISON:  05/05/2021 FINDINGS: Cardiovascular: New very large pericardial effusion. Recommend cardiology consultation. The heart is normal in  size. Stable thoracic aorta. No dissection or focal aneurysm. The pulmonary arterial tree is well opacified. No filling defects to suggest pulmonary embolism. The left pulmonary artery branches are severely compressed because of the large left lower lobe lung mass, obstructed left lung and large complex left pleural effusion. Mediastinum/Nodes: Progressive subcarinal adenopathy. Lungs/Pleura: Complete left lower lobe atelectasis with obstruction of the left lower lobe bronchus. There is  a PleurX drainage catheter in place but there is a persistent large loculated left pleural fluid collection and significant atelectasis of most of the lung. Small right pleural effusion with overlying atelectasis. No right-sided pulmonary lesions or pulmonary nodules. Upper Abdomen: No significant upper abdominal findings. Musculoskeletal: No breast masses, supraclavicular or axillary adenopathy. The bony thorax is grossly intact. Stable sclerotic lesion involving the L1 vertebral body. Review of the MIP images confirms the above findings. IMPRESSION: 1. No CT findings for pulmonary embolism. 2. New very large pericardial effusion. Recommend cardiology consultation. 3. Complete left lower lobe atelectasis with obstruction of the left lower lobe bronchus. 4. PleurX drainage catheter in place but there is a persistent large loculated left pleural fluid collection and significant atelectasis of most of the lung. 5. Progressive subcarinal adenopathy. 6. Small right pleural effusion with overlying atelectasis. 7. Stable sclerotic lesion involving the L1 vertebral body. 8. Aortic atherosclerosis. Aortic Atherosclerosis (ICD10-I70.0). Electronically Signed   By: Marijo Sanes M.D.   On: 06/30/2021 14:18   ECHOCARDIOGRAM COMPLETE  Result Date: 06/30/2021    ECHOCARDIOGRAM REPORT   Patient Name:   Tami Stone Date of Exam: 06/30/2021 Medical Rec #:  [id number]          Height:       62.0 in Accession #:    [id number]         Weight:        110.0 lb Date of Birth:  May 05, 1951          BSA:          1.483 m Patient Age:    56 years           BP:           114/82 mmHg Patient Gender: F                  HR:           109 bpm. Exam Location:  Inpatient Procedure: 2D Echo, Cardiac Doppler and Color Doppler STAT ECHO Indications:    Pericardial effusion I31.3  History:        Patient has prior history of Echocardiogram examinations, most                 recent 01/18/2021. Risk Factors:Diabetes and Dyslipidemia. Lung                 Cancer.  Sonographer:    Darlina Sicilian RDCS Referring Phys: [id number] [facility]  Sonographer Comments: Recent Thoracentesis in Apical position. Imaging best from subcostal. IMPRESSIONS  1. Left ventricular ejection fraction, by estimation, is 60 to 65%. The left ventricle has normal function. The left ventricle has no regional wall motion abnormalities. Left ventricular diastolic parameters are consistent with Grade I diastolic dysfunction (impaired relaxation).  2. Right ventricular systolic function is normal. The right ventricular size is normal. There is normal pulmonary artery systolic pressure.  3. Compared with the echo 01/2021, pericardial effusion is larger. Large pericardial effusion measuring up to 4.2 cm. Large pericardial effusion. The pericardial effusion is circumferential. Findings are consistent with cardiac tamponade.  4. The mitral valve is normal in structure. No evidence of mitral valve regurgitation. No evidence of mitral stenosis.  5. The aortic valve is tricuspid. Aortic valve regurgitation is not visualized. No aortic stenosis is present.  6. The inferior vena cava is normal in size with <50% respiratory variability, suggesting right atrial pressure of 8 mmHg. FINDINGS  Left Ventricle: Left ventricular ejection  fraction, by estimation, is 60 to 65%. The left ventricle has normal function. The left ventricle has no regional wall motion abnormalities. The left ventricular internal cavity size was normal  in size. There is  no left ventricular hypertrophy. Left ventricular diastolic parameters are consistent with Grade I diastolic dysfunction (impaired relaxation). Right Ventricle: The right ventricular size is normal. No increase in right ventricular wall thickness. Right ventricular systolic function is normal. There is normal pulmonary artery systolic pressure. The tricuspid regurgitant velocity is 1.94 m/s, and  with an assumed right atrial pressure of 8 mmHg, the estimated right ventricular systolic pressure is 97.9 mmHg. Left Atrium: Left atrial size was normal in size. Right Atrium: Right atrial size was normal in size. Pericardium: Compared with the echo 01/2021, pericardial effusion is larger. Large pericardial effusion measuring up to 4.2 cm. A large pericardial effusion is present. The pericardial effusion is circumferential. There is diastolic collapse of the right ventricular free wall, diastolic collapse of the right atrial wall and excessive respiratory variation in the mitral valve spectral Doppler velocities. There is evidence of cardiac tamponade. Mitral Valve: The mitral valve is normal in structure. No evidence of mitral valve regurgitation. No evidence of mitral valve stenosis. Tricuspid Valve: The tricuspid valve is normal in structure. Tricuspid valve regurgitation is mild . No evidence of tricuspid stenosis. Aortic Valve: The aortic valve is tricuspid. Aortic valve regurgitation is not visualized. No aortic stenosis is present. Pulmonic Valve: The pulmonic valve was normal in structure. Pulmonic valve regurgitation is not visualized. No evidence of pulmonic stenosis. Aorta: The aortic root is normal in size and structure. Venous: The inferior vena cava is normal in size with less than 50% respiratory variability, suggesting right atrial pressure of 8 mmHg. IAS/Shunts: No atrial level shunt detected by color flow Doppler.  LEFT VENTRICLE PLAX 2D LVOT diam:     1.90 cm LV SV:         33 LV SV  Index:   23 LVOT Area:     2.84 cm  AORTIC VALVE LVOT Vmax:   93.20 cm/s LVOT Vmean:  62.300 cm/s LVOT VTI:    0.118 m MITRAL VALVE               TRICUSPID VALVE MV Area (PHT): 4.86 cm    TR Peak grad:   15.1 mmHg MV Decel Time: 156 msec    TR Vmax:        194.00 cm/s MV E velocity: 43.30 cm/s MV A velocity: 58.30 cm/s  SHUNTS MV E/A ratio:  0.74        Systemic VTI:  0.12 m                            Systemic Diam: 1.90 cm Skeet Latch MD Electronically signed by Skeet Latch MD Signature Date/Time: 06/30/2021/3:43:19 PM    Final     EKG: Independently reviewed.   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 - Oncology consulted for last admission but patient is not interested in chemotherapy or immunotherapy.  - Radiation oncology consulted from last admission who recommended whole brain XRT but the patient  is undecided about radiation therapy either.   She is planning to try holistic treatment with "DCO" and "[place] tail".   Dysphagia due to extrinsic compression from cancer Discussed with her regarding liquid /soft diet She would like to have regular diet so she could have more choices , regular diet ordered per patient's preference   Stage III breast cancer On femara    Noninsulin-dependent type 2 diabetes , controlled  A1c 5.4%.  On metformin at home. 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  Also has poor oral intake    Leukocytosis: Likely reactive, also has been on steroid, does not appear to have  infection  Thrombocytosis : likely reactive -Continue monitoring.    -Normocytic anemia  no evidence of acute blood loss -Suspect secondary to underlying malignancy -Received 2 unit PRBC during recent admission    DVT prophylaxis: lovenox   Code Status:   Partial code, yes to cpr/shocking, no intubation, verified with patient and family, currently she does not want to be DNR Family Communication:    Patient is from:home    Anticipated DC to: TBD   Anticipated DC date: TBD     Voice Recognition /Dragon dictation system was used to create this note, attempts have been made to correct errors. Please contact the author with questions and/or clarifications.  Florencia Reasons MD PhD FACP [facility]  How to contact the [facility] Attending or Consulting provider [place] or covering provider during after hours [place], for this patient?   Check the care team in [facility] and look for a) attending/consulting [facility] provider listed and b) the [facility] team listed Log into [email or web address] and use [place]'s universal password to access. If you do not have the password, please contact the hospital operator. Locate the [facility] provider you are looking for under [facility] and page to a number that you can be directly reached. If you still have difficulty reaching the provider, please page the [facility] (Director on Call) for the Hospitalists listed on amion for assistance.  06/30/2021, 4:56 PM

## 2021-06-30 NOTE — ED Provider Notes (Signed)
[place] DEPT Provider Note   CSN: [id number] Arrival date & time: 06/30/21  1014     History  Chief Complaint  Patient presents with   Shortness of Breath    Tami Stone is a 71 y.o. female.  Patient with history of stage IV metastatic cancer, presents with chief complaint of worsening shortness of breath.  She was recently admitted and discharged about 4 days ago for left-sided pleural effusion and indwelling drain had been placed.  She states his drain has not been putting out much fluid in the last several days.  Denies any new cough has a persistent unchanged cough.  No fevers no vomiting reported.  No chest pain reported.      Home Medications Prior to Admission medications   Medication Sig Start Date End Date Taking? Authorizing Provider  Black Pepper-Turmeric (TURMERIC PLUS BLACK PEPPER EXT PO) Take 1 capsule by mouth in the morning, at noon, and at bedtime.   Yes [provider]  Cholecalciferol (VITAMIN D) 125 MCG (5000 UT) CAPS Take 10,000 Units by mouth every other day.   Yes [provider]  Coenzyme Q10 (CO Q 10 PO) Take 300 mg by mouth at bedtime.   Yes [provider]  DANDELION PO Take 1 tablet by mouth daily with supper.   Yes [provider]  dexamethasone (DECADRON) 4 MG tablet Take 1 tablet (4 mg total) by mouth every 12 (twelve) hours. 06/26/21 07/26/21 Yes Donne Hazel, MD  docusate sodium (COLACE) 100 MG capsule Take 1 capsule (100 mg total) by mouth 2 (two) times daily. 06/26/21 07/26/21 Yes Donne Hazel, MD  Flavoring Agent (APRICOT FLAVOR) POWD Take 2 capsules by mouth daily. Apricot Seed Supplement   Yes [provider]  gabapentin (NEURONTIN) 300 MG capsule Take 1 capsule (300 mg total) by mouth at bedtime. 06/26/21 07/26/21 Yes Donne Hazel, MD  ibuprofen (ADVIL) 200 MG tablet Take 400 mg by mouth every 6 (six) hours as needed for mild pain.   Yes [provider]  IODINE, KELP, PO Take 1 capsule by mouth in the morning.   Yes [provider]  letrozole (FEMARA) 2.5 MG tablet Take 1 tablet (2.5 mg total) by mouth daily. 04/28/21  Yes Volanda Napoleon, MD  metFORMIN (GLUCOPHAGE) 500 MG tablet Take 1 tablet (500 mg total) by mouth 3 (three) times daily. 03/03/21  Yes Volanda Napoleon, MD  NP THYROID 60 MG tablet Take 60 mg by mouth daily. 02/24/20  Yes [provider]  polyethylene glycol (MIRALAX / GLYCOLAX) 17 g packet Take 17 g by mouth daily. 01/23/21  Yes Georgette Shell, MD  Prenatal Vit-Fe Fumarate-FA (MULTIVITAMIN-PRENATAL) 27-0.8 MG TABS tablet Take 1 tablet by mouth daily at 12 noon.   Yes [provider]  Zinc 100 MG TABS Take 100 mg by mouth daily.   Yes [provider]  lacosamide (VIMPAT) 50 MG TABS tablet Take 1 tablet (50 mg total) by mouth 2 (two) times daily. Patient not taking: Reported on 06/30/2021 06/26/21 07/26/21  Donne Hazel, MD      Allergies    Patient has no known allergies.    Review of Systems   Review of Systems  Constitutional:  Negative for fever.  HENT:  Negative for ear pain.   Eyes:  Negative for pain.  Respiratory:  Positive for cough and shortness of breath.   Cardiovascular:  Negative for chest pain.  Gastrointestinal:  Negative for  abdominal pain.  Genitourinary:  Negative for flank pain.  Musculoskeletal:  Negative for back pain.  Skin:  Negative for rash.  Neurological:  Negative for headaches.   Physical Exam Updated Vital Signs BP 99/78    Pulse (!) 109    Temp 98.1 F (36.7 C)    Resp 16    Ht 5\' 2"  (1.575 m)    Wt 49.9 kg    SpO2 97%    BMI 20.12 kg/m  Physical Exam Constitutional:      General: She is not in acute distress.    Appearance: Normal appearance.  HENT:     Head: Normocephalic.     Nose: Nose normal.  Eyes:     Extraocular Movements: Extraocular movements intact.  Cardiovascular:     Rate and Rhythm: Normal rate.  Pulmonary:      Effort: Tachypnea present.     Breath sounds: Decreased breath sounds present.  Musculoskeletal:        General: Normal range of motion.     Cervical back: Normal range of motion.  Neurological:     General: No focal deficit present.     Mental Status: She is alert. Mental status is at baseline.    ED Results / Procedures / Treatments   Labs (all labs ordered are listed, but only abnormal results are displayed) Labs Reviewed  CBC WITH DIFFERENTIAL/PLATELET - Abnormal; Notable for the following components:      Result Value   WBC 16.6 (*)    Hemoglobin 10.9 (*)    HCT 34.9 (*)    RDW 18.3 (*)    Platelets 686 (*)    Neutro Abs 14.1 (*)    Monocytes Absolute 1.2 (*)    Abs Immature Granulocytes 0.14 (*)    All other components within normal limits  BASIC METABOLIC PANEL - Abnormal; Notable for the following components:   Sodium 129 (*)    Chloride 92 (*)    Glucose, Bld 116 (*)    BUN 31 (*)    Calcium 8.4 (*)    All other components within normal limits  LACTIC ACID, PLASMA - Abnormal; Notable for the following components:   Lactic Acid, Venous 2.4 (*)    All other components within normal limits  RESP PANEL BY RT-PCR (FLU A&B, COVID) ARPGX2  CULTURE, BLOOD (ROUTINE X 2)  CULTURE, BLOOD (ROUTINE X 2)  BRAIN NATRIURETIC PEPTIDE  PROCALCITONIN  LACTIC ACID, PLASMA  TROPONIN I (HIGH SENSITIVITY)  TROPONIN I (HIGH SENSITIVITY)    EKG None  Radiology DG Chest 2 View  Result Date: 06/30/2021 CLINICAL DATA:  Short of breath and weakness.  Lung cancer. EXAM: CHEST - 2 VIEW COMPARISON:  06/23/2021 FINDINGS: Left basilar chest tube in place. No pneumothorax. Near complete opacification left chest due to effusion and tumor and collapse left lung. Small amount of aerated lung on the left. Heart is displaced to the right. Possible pericardial effusion. Minimal right pleural effusion.  Right lung otherwise clear. IMPRESSION: Progressive opacification left chest due to fluid,  tumor, and collapse. Left chest tube in place. No pneumothorax Heart displaced to the right.  Question pericardial effusion Electronically Signed   By: Franchot Gallo M.D.   On: 06/30/2021 11:22   CT Angio Chest PE W and/or Wo Contrast  Result Date: 06/30/2021 CLINICAL DATA:  Episode of shortness of breath. Known stage IV lung cancer. EXAM: CT ANGIOGRAPHY CHEST WITH CONTRAST TECHNIQUE: Multidetector CT imaging of the chest was performed using the standard  protocol during bolus administration of intravenous contrast. Multiplanar CT image reconstructions and MIPs were obtained to evaluate the vascular anatomy. RADIATION DOSE REDUCTION: This exam was performed according to the departmental dose-optimization program which includes automated exposure control, adjustment of the mA and/or kV according to patient size and/or use of iterative reconstruction technique. CONTRAST:  44mL OMNIPAQUE IOHEXOL 350 MG/ML SOLN COMPARISON:  05/05/2021 FINDINGS: Cardiovascular: New very large pericardial effusion. Recommend cardiology consultation. The heart is normal in size. Stable thoracic aorta. No dissection or focal aneurysm. The pulmonary arterial tree is well opacified. No filling defects to suggest pulmonary embolism. The left pulmonary artery branches are severely compressed because of the large left lower lobe lung mass, obstructed left lung and large complex left pleural effusion. Mediastinum/Nodes: Progressive subcarinal adenopathy. Lungs/Pleura: Complete left lower lobe atelectasis with obstruction of the left lower lobe bronchus. There is a PleurX drainage catheter in place but there is a persistent large loculated left pleural fluid collection and significant atelectasis of most of the lung. Small right pleural effusion with overlying atelectasis. No right-sided pulmonary lesions or pulmonary nodules. Upper Abdomen: No significant upper abdominal findings. Musculoskeletal: No breast masses, supraclavicular or axillary  adenopathy. The bony thorax is grossly intact. Stable sclerotic lesion involving the L1 vertebral body. Review of the MIP images confirms the above findings. IMPRESSION: 1. No CT findings for pulmonary embolism. 2. New very large pericardial effusion. Recommend cardiology consultation. 3. Complete left lower lobe atelectasis with obstruction of the left lower lobe bronchus. 4. PleurX drainage catheter in place but there is a persistent large loculated left pleural fluid collection and significant atelectasis of most of the lung. 5. Progressive subcarinal adenopathy. 6. Small right pleural effusion with overlying atelectasis. 7. Stable sclerotic lesion involving the L1 vertebral body. 8. Aortic atherosclerosis. Aortic Atherosclerosis (ICD10-I70.0). Electronically Signed   By: Marijo Sanes M.D.   On: 06/30/2021 14:18   ECHOCARDIOGRAM COMPLETE  Result Date: 06/30/2021    ECHOCARDIOGRAM REPORT   Patient Name:   Tami Stone Date of Exam: 06/30/2021 Medical Rec #:  [id number]          Height:       62.0 in Accession #:    [id number]         Weight:       110.0 lb Date of Birth:  1950-08-24          BSA:          1.483 m Patient Age:    82 years           BP:           114/82 mmHg Patient Gender: F                  HR:           109 bpm. Exam Location:  Inpatient Procedure: 2D Echo, Cardiac Doppler and Color Doppler STAT ECHO Indications:    Pericardial effusion I31.3  History:        Patient has prior history of Echocardiogram examinations, most                 recent 01/18/2021. Risk Factors:Diabetes and Dyslipidemia. Lung                 Cancer.  Sonographer:    Darlina Sicilian RDCS Referring Phys: [id number] [facility]  Sonographer Comments: Recent Thoracentesis in Apical position. Imaging best from subcostal. IMPRESSIONS  1. Left ventricular ejection fraction, by estimation, is 60 to  65%. The left ventricle has normal function. The left ventricle has no regional wall motion abnormalities. Left ventricular  diastolic parameters are consistent with Grade I diastolic dysfunction (impaired relaxation).  2. Right ventricular systolic function is normal. The right ventricular size is normal. There is normal pulmonary artery systolic pressure.  3. Compared with the echo 01/2021, pericardial effusion is larger. Large pericardial effusion measuring up to 4.2 cm. Large pericardial effusion. The pericardial effusion is circumferential. Findings are consistent with cardiac tamponade.  4. The mitral valve is normal in structure. No evidence of mitral valve regurgitation. No evidence of mitral stenosis.  5. The aortic valve is tricuspid. Aortic valve regurgitation is not visualized. No aortic stenosis is present.  6. The inferior vena cava is normal in size with <50% respiratory variability, suggesting right atrial pressure of 8 mmHg. FINDINGS  Left Ventricle: Left ventricular ejection fraction, by estimation, is 60 to 65%. The left ventricle has normal function. The left ventricle has no regional wall motion abnormalities. The left ventricular internal cavity size was normal in size. There is  no left ventricular hypertrophy. Left ventricular diastolic parameters are consistent with Grade I diastolic dysfunction (impaired relaxation). Right Ventricle: The right ventricular size is normal. No increase in right ventricular wall thickness. Right ventricular systolic function is normal. There is normal pulmonary artery systolic pressure. The tricuspid regurgitant velocity is 1.94 m/s, and  with an assumed right atrial pressure of 8 mmHg, the estimated right ventricular systolic pressure is 82.9 mmHg. Left Atrium: Left atrial size was normal in size. Right Atrium: Right atrial size was normal in size. Pericardium: Compared with the echo 01/2021, pericardial effusion is larger. Large pericardial effusion measuring up to 4.2 cm. A large pericardial effusion is present. The pericardial effusion is circumferential. There is diastolic  collapse of the right ventricular free wall, diastolic collapse of the right atrial wall and excessive respiratory variation in the mitral valve spectral Doppler velocities. There is evidence of cardiac tamponade. Mitral Valve: The mitral valve is normal in structure. No evidence of mitral valve regurgitation. No evidence of mitral valve stenosis. Tricuspid Valve: The tricuspid valve is normal in structure. Tricuspid valve regurgitation is mild . No evidence of tricuspid stenosis. Aortic Valve: The aortic valve is tricuspid. Aortic valve regurgitation is not visualized. No aortic stenosis is present. Pulmonic Valve: The pulmonic valve was normal in structure. Pulmonic valve regurgitation is not visualized. No evidence of pulmonic stenosis. Aorta: The aortic root is normal in size and structure. Venous: The inferior vena cava is normal in size with less than 50% respiratory variability, suggesting right atrial pressure of 8 mmHg. IAS/Shunts: No atrial level shunt detected by color flow Doppler.  LEFT VENTRICLE PLAX 2D LVOT diam:     1.90 cm LV SV:         33 LV SV Index:   23 LVOT Area:     2.84 cm  AORTIC VALVE LVOT Vmax:   93.20 cm/s LVOT Vmean:  62.300 cm/s LVOT VTI:    0.118 m MITRAL VALVE               TRICUSPID VALVE MV Area (PHT): 4.86 cm    TR Peak grad:   15.1 mmHg MV Decel Time: 156 msec    TR Vmax:        194.00 cm/s MV E velocity: 43.30 cm/s MV A velocity: 58.30 cm/s  SHUNTS MV E/A ratio:  0.74        Systemic VTI:  0.12 m  Systemic Diam: 1.90 cm Skeet Latch MD Electronically signed by Skeet Latch MD Signature Date/Time: 06/30/2021/3:43:19 PM    Final     Procedures .Critical Care Performed by: Luna Fuse, MD Authorized by: Luna Fuse, MD   Critical care provider statement:    Critical care time (minutes):  40   Critical care time was exclusive of:  Separately billable procedures and treating other patients and teaching time   Critical care was  necessary to treat or prevent imminent or life-threatening deterioration of the following conditions:  Cardiac failure and respiratory failure    Medications Ordered in ED Medications  albuterol (VENTOLIN HFA) 108 (90 Base) MCG/ACT inhaler 2 puff (has no administration in time range)  sodium chloride (PF) 0.9 % injection (  Not Given 06/30/21 1352)  sodium chloride (PF) 0.9 % injection (  Canceled Entry 06/30/21 1421)  iohexol (OMNIPAQUE) 350 MG/ML injection 100 mL (69 mLs Intravenous Contrast Given 06/30/21 1359)    ED Course/ Medical Decision Making/ A&P                           Medical Decision Making Amount and/or Complexity of Data Reviewed External Data Reviewed: radiology.    Details: Prior admissions, showing left lung fluid. Labs: ordered. Decision-making details documented in ED Course. Radiology: ordered. ECG/medicine tests: ordered and independent interpretation performed. Decision-making details documented in ED Course. Discussion of management or test interpretation with external provider(s): Consultation with cardiology.  Consultation with cardiothoracic surgery.    Risk Prescription drug management. Decision regarding hospitalization.   Patient work-up here concerning for recurrent left-sided pleural effusion.  CT of the chest also concerning for very large pericardial effusion.  Patient persistently tachycardic blood pressure is soft with a systolic about 878- 676.  Case discussed with cardiology recommending stat echo which was ordered.  Case also discussed with cardiothoracic surgery who will follow up echo result.          Final Clinical Impression(s) / ED Diagnoses Final diagnoses:  Pericardial effusion  Recurrent left pleural effusion    Rx / DC Orders ED Discharge Orders     None         Luna Fuse, MD 06/30/21 1622

## 2021-06-30 NOTE — Progress Notes (Signed)
°  Echocardiogram 2D Echocardiogram has been performed.  Johny Chess 06/30/2021, 5:37 PM

## 2021-06-30 NOTE — Telephone Encounter (Signed)
Received a call from patients husband stating that Tami Stone would not be able to make her appt today as she is not feeling well.  Returned call but received VM.  Left a message acknowledging her call

## 2021-06-30 NOTE — H&P (Addendum)
Cardiology Admission History and Physical:   Patient ID: Tami Stone MRN: [id number]; DOB: Jan 20, 1951   Admission date: 06/30/2021  PCP:  Gaynelle Cage, MD   [facility] Providers Cardiologist:  None       Chief Complaint:  Cardiac tamponade  Patient Profile:   Tami Stone is a 71 y.o. female with metastatic breast cancer, known pleural effusion, HLD and DM with large worsend pericardial effusion who is being seen 06/30/2021 for the evaluation of cardiac tamponade.  History of Present Illness:   Tami Stone  notes that she is feeling worsening shortness of breath.  Has had no chest pain, chest pressure, chest tightness, chest stinging.  Has a history of dizziness and syncope in the past but had worsening sob.  Notes that her PleuX catheter was not draining.  She was not sure that she was going to pursue additional chemotherapy, but worsening SOB in the shower lead to eval (she ended up cancelling her appointment .Patient exertion notable for SOB with even ADLs.  No syncope.  Notes  no palpitations or funny heart beats.     At 06/26/21 discharge her heart reate was 108.  Her blood pressure was elevated at 135/90 at discharge but she is now at 90s/70s.  No prior cardiologist.  Small effusion in 01/2021.  In CTPE eval both L pleural effusion and pericardial effusion have worsened.  Cardiology emergently called.  Feels worsening SOB. Not sure if she wants chemo. SOB worsening lead to ED eval NO syncope Heart rate is always fast    Large Effusion by CT- anterior in nature Large effusion by Echo  There is RV collapse. Mitral Valve inflow respirophasic variation greater than 30%    LVEF is preserved.      Past Medical History:  Diagnosis Date   Diabetes ([place])    Does not check bs at home   Dyspnea    High cholesterol    Lung cancer ([place])    04/2020    Past Surgical History:  Procedure Laterality Date   ABDOMINAL HYSTERECTOMY     BREAST BIOPSY Right     BRONCHIAL BIOPSY  04/30/2020   Procedure: BRONCHIAL BIOPSIES;  Surgeon: Collene Gobble, MD;  Location: [place];  Service: Cardiopulmonary;;   BRONCHIAL BRUSHINGS  04/30/2020   Procedure: BRONCHIAL BRUSHINGS;  Surgeon: Collene Gobble, MD;  Location: [place];  Service: Cardiopulmonary;;   BRONCHIAL BRUSHINGS  01/20/2021   Procedure: BRONCHIAL BRUSHINGS;  Surgeon: Laurin Coder, MD;  Location: [facility];  Service: Endoscopy;;   BRONCHIAL WASHINGS  01/20/2021   Procedure: BRONCHIAL WASHINGS;  Surgeon: Laurin Coder, MD;  Location: [facility];  Service: Endoscopy;;   CHEST TUBE INSERTION Left 05/28/2021   Procedure: INSERTION PLEURAL DRAINAGE CATHETER;  Surgeon: [name], DO;  Location: [place];  Service: Pulmonary;  Laterality: Left;  Prior to bronchoscopy   HEMOSTASIS CONTROL  04/30/2020   Procedure: HEMOSTASIS CONTROL;  Surgeon: Collene Gobble, MD;  Location: [place];  Service: Cardiopulmonary;;   IR THORACENTESIS ASP PLEURAL SPACE W/IMG GUIDE  04/15/2021   IR THORACENTESIS ASP PLEURAL SPACE W/IMG GUIDE  05/03/2021   IR THORACENTESIS ASP PLEURAL SPACE W/IMG GUIDE  05/11/2021   TUBAL LIGATION     VIDEO BRONCHOSCOPY N/A 04/30/2020   Procedure: VIDEO BRONCHOSCOPY WITH FLUORO;  Surgeon: Collene Gobble, MD;  Location: [place];  Service: Cardiopulmonary;  Laterality: N/A;   VIDEO BRONCHOSCOPY N/A 01/20/2021   Procedure: VIDEO BRONCHOSCOPY WITHOUT FLUORO;  Surgeon: Ander Slade,  Adewale A, MD;  Location: [facility];  Service: Endoscopy;  Laterality: N/A;   VIDEO BRONCHOSCOPY Left 05/28/2021   Procedure: VIDEO BRONCHOSCOPY WITHOUT FLUORO;  Surgeon: [name], DO;  Location: [place];  Service: Pulmonary;  Laterality: Left;     Medications Prior to Admission: Prior to Admission medications   Medication Sig Start Date End Date Taking? Authorizing Provider  Black Pepper-Turmeric (TURMERIC PLUS BLACK PEPPER EXT PO) Take 1 capsule by mouth in  the morning, at noon, and at bedtime.   Yes [provider]  Cholecalciferol (VITAMIN D) 125 MCG (5000 UT) CAPS Take 10,000 Units by mouth every other day.   Yes [provider]  Coenzyme Q10 (CO Q 10 PO) Take 300 mg by mouth at bedtime.   Yes [provider]  DANDELION PO Take 1 tablet by mouth daily with supper.   Yes [provider]  dexamethasone (DECADRON) 4 MG tablet Take 1 tablet (4 mg total) by mouth every 12 (twelve) hours. 06/26/21 07/26/21 Yes Donne Hazel, MD  docusate sodium (COLACE) 100 MG capsule Take 1 capsule (100 mg total) by mouth 2 (two) times daily. 06/26/21 07/26/21 Yes Donne Hazel, MD  Flavoring Agent (APRICOT FLAVOR) POWD Take 2 capsules by mouth daily. Apricot Seed Supplement   Yes [provider]  gabapentin (NEURONTIN) 300 MG capsule Take 1 capsule (300 mg total) by mouth at bedtime. 06/26/21 07/26/21 Yes Donne Hazel, MD  ibuprofen (ADVIL) 200 MG tablet Take 400 mg by mouth every 6 (six) hours as needed for mild pain.   Yes [provider]  IODINE, KELP, PO Take 1 capsule by mouth in the morning.   Yes [provider]  letrozole (FEMARA) 2.5 MG tablet Take 1 tablet (2.5 mg total) by mouth daily. 04/28/21  Yes Volanda Napoleon, MD  metFORMIN (GLUCOPHAGE) 500 MG tablet Take 1 tablet (500 mg total) by mouth 3 (three) times daily. 03/03/21  Yes Volanda Napoleon, MD  NP THYROID 60 MG tablet Take 60 mg by mouth daily. 02/24/20  Yes [provider]  polyethylene glycol (MIRALAX / GLYCOLAX) 17 g packet Take 17 g by mouth daily. 01/23/21  Yes Georgette Shell, MD  Prenatal Vit-Fe Fumarate-FA (MULTIVITAMIN-PRENATAL) 27-0.8 MG TABS tablet Take 1 tablet by mouth daily at 12 noon.   Yes [provider]  Zinc 100 MG TABS Take 100 mg by mouth daily.   Yes [provider]  lacosamide (VIMPAT) 50 MG TABS tablet Take 1 tablet (50 mg total) by mouth 2 (two) times daily. Patient not taking:  Reported on 06/30/2021 06/26/21 07/26/21  Donne Hazel, MD     Allergies:   No Known Allergies  Social History:   Social History   Socioeconomic History   Marital status: Married    Spouse name: Not on file   Number of children: Not on file   Years of education: Not on file   Highest education level: Not on file  Occupational History   Not on file  Tobacco Use   Smoking status: Never   Smokeless tobacco: Never  Vaping Use   Vaping Use: Never used  Substance and Sexual Activity   Alcohol use: Not Currently   Drug use: Never   Sexual activity: Not on file  Other Topics Concern   Not on file  Social History Narrative   Not on file   Social Determinants of Health   Financial Resource Strain: Not on file  Food Insecurity:  Not on file  Transportation Needs: Not on file  Physical Activity: Not on file  Stress: Not on file  Social Connections: Not on file  Intimate Partner Violence: Not on file    Family History:   The patient's family history includes Breast cancer in her sister. There is no history of Colon cancer or Esophageal cancer.    ROS:  Please see the history of present illness.  All other ROS reviewed and negative.     Physical Exam/Data:   Vitals:   06/30/21 1245 06/30/21 1315 06/30/21 1345 06/30/21 1533  BP: 101/79 98/74 114/82 108/80  Pulse: (!) 108 (!) 106 (!) 109 (!) 105  Resp: (!) 25 20 16 18   Temp:      SpO2: 98% 100% 98% 97%  Weight:      Height:       No intake or output data in the 24 hours ending 06/30/21 1558 Last 3 Weights 06/30/2021 06/23/2021 06/17/2021  Weight (lbs) 110 lb 110 lb 116 lb  Weight (kg) 49.896 kg 49.896 kg 52.617 kg     Body mass index is 20.12 kg/m.  Gen: Mild distress, thin cachetic female   Neck: JVD to mid neck Cardiac: Muffled heart sounds no Gallops, no Murmur, tachycardia, +2 radial pulses Respiratory: Tachypnea with decrease L lung sounds GI: Soft, nontender, non-distended  MS: No  edema;  moves all  extremities Integument: Skin feels warm Neuro:  At time of evaluation, alert and oriented to person/place/time/situation  Psych: Anxious mood and affect   EKG:  The ECG that was done  was personally reviewed and demonstrates sinus tachycardia rate 108 low voltage   Laboratory Data:  High Sensitivity Troponin:   Recent Labs  Lab 06/23/21 1623 06/30/21 1103  TROPONINIHS 3 3      Chemistry Recent Labs  Lab 06/25/21 0455 06/26/21 0633 06/30/21 1103  NA 134* 135 129*  K 3.9 4.3 4.3  CL 94* 96* 92*  CO2 26 28 26   GLUCOSE 163* 124* 116*  BUN 12 10 31*  CREATININE <0.30* <0.30* 0.78  CALCIUM 8.4* 8.8* 8.4*  MG 1.9  --   --   GFRNONAA NOT CALCULATED NOT CALCULATED >60  ANIONGAP 14 11 11     Recent Labs  Lab 06/24/21 0721 06/25/21 0455 06/26/21 0633  PROT 6.4*  --  6.6  ALBUMIN 2.3* 2.2* 2.4*  AST 11*  --  13*  ALT 10  --  12  ALKPHOS 87  --  82  BILITOT 0.6  --  0.3   Lipids No results for input(s): CHOL, TRIG, HDL, LABVLDL, LDLCALC, CHOLHDL in the last 168 hours. Hematology Recent Labs  Lab 06/26/21 0633 06/30/21 1103  WBC 12.6* 16.6*  RBC 3.20* 4.02  HGB 8.4* 10.9*  HCT 27.5* 34.9*  MCV 85.9 86.8  MCH 26.3 27.1  MCHC 30.5 31.2  RDW 17.4* 18.3*  PLT 729* 686*   Thyroid No results for input(s): TSH, FREET4 in the last 168 hours. BNP Recent Labs  Lab 06/30/21 1103  BNP 55.9    DDimer No results for input(s): DDIMER in the last 168 hours.   Radiology/Studies:  DG Chest 2 View  Result Date: 06/30/2021 CLINICAL DATA:  Short of breath and weakness.  Lung cancer. EXAM: CHEST - 2 VIEW COMPARISON:  06/23/2021 FINDINGS: Left basilar chest tube in place. No pneumothorax. Near complete opacification left chest due to effusion and tumor and collapse left lung. Small amount of aerated lung on the left. Heart is displaced  to the right. Possible pericardial effusion. Minimal right pleural effusion.  Right lung otherwise clear. IMPRESSION: Progressive opacification  left chest due to fluid, tumor, and collapse. Left chest tube in place. No pneumothorax Heart displaced to the right.  Question pericardial effusion Electronically Signed   By: Franchot Gallo M.D.   On: 06/30/2021 11:22   CT Angio Chest PE W and/or Wo Contrast  Result Date: 06/30/2021 CLINICAL DATA:  Episode of shortness of breath. Known stage IV lung cancer. EXAM: CT ANGIOGRAPHY CHEST WITH CONTRAST TECHNIQUE: Multidetector CT imaging of the chest was performed using the standard protocol during bolus administration of intravenous contrast. Multiplanar CT image reconstructions and MIPs were obtained to evaluate the vascular anatomy. RADIATION DOSE REDUCTION: This exam was performed according to the departmental dose-optimization program which includes automated exposure control, adjustment of the mA and/or kV according to patient size and/or use of iterative reconstruction technique. CONTRAST:  19mL OMNIPAQUE IOHEXOL 350 MG/ML SOLN COMPARISON:  05/05/2021 FINDINGS: Cardiovascular: New very large pericardial effusion. Recommend cardiology consultation. The heart is normal in size. Stable thoracic aorta. No dissection or focal aneurysm. The pulmonary arterial tree is well opacified. No filling defects to suggest pulmonary embolism. The left pulmonary artery branches are severely compressed because of the large left lower lobe lung mass, obstructed left lung and large complex left pleural effusion. Mediastinum/Nodes: Progressive subcarinal adenopathy. Lungs/Pleura: Complete left lower lobe atelectasis with obstruction of the left lower lobe bronchus. There is a PleurX drainage catheter in place but there is a persistent large loculated left pleural fluid collection and significant atelectasis of most of the lung. Small right pleural effusion with overlying atelectasis. No right-sided pulmonary lesions or pulmonary nodules. Upper Abdomen: No significant upper abdominal findings. Musculoskeletal: No breast masses,  supraclavicular or axillary adenopathy. The bony thorax is grossly intact. Stable sclerotic lesion involving the L1 vertebral body. Review of the MIP images confirms the above findings. IMPRESSION: 1. No CT findings for pulmonary embolism. 2. New very large pericardial effusion. Recommend cardiology consultation. 3. Complete left lower lobe atelectasis with obstruction of the left lower lobe bronchus. 4. PleurX drainage catheter in place but there is a persistent large loculated left pleural fluid collection and significant atelectasis of most of the lung. 5. Progressive subcarinal adenopathy. 6. Small right pleural effusion with overlying atelectasis. 7. Stable sclerotic lesion involving the L1 vertebral body. 8. Aortic atherosclerosis. Aortic Atherosclerosis (ICD10-I70.0). Electronically Signed   By: Marijo Sanes M.D.   On: 06/30/2021 14:18   ECHOCARDIOGRAM COMPLETE  Result Date: 06/30/2021    ECHOCARDIOGRAM REPORT   Patient Name:   Tami Stone Date of Exam: 06/30/2021 Medical Rec #:  [id number]          Height:       62.0 in Accession #:    [id number]         Weight:       110.0 lb Date of Birth:  09/29/1950          BSA:          1.483 m Patient Age:    53 years           BP:           114/82 mmHg Patient Gender: F                  HR:           109 bpm. Exam Location:  Inpatient Procedure: 2D Echo, Cardiac Doppler and Color Doppler STAT ECHO  Indications:    Pericardial effusion I31.3  History:        Patient has prior history of Echocardiogram examinations, most                 recent 01/18/2021. Risk Factors:Diabetes and Dyslipidemia. Lung                 Cancer.  Sonographer:    Darlina Sicilian RDCS Referring Phys: [id number] [facility]  Sonographer Comments: Recent Thoracentesis in Apical position. Imaging best from subcostal. IMPRESSIONS  1. Left ventricular ejection fraction, by estimation, is 60 to 65%. The left ventricle has normal function. The left ventricle has no regional wall motion  abnormalities. Left ventricular diastolic parameters are consistent with Grade I diastolic dysfunction (impaired relaxation).  2. Right ventricular systolic function is normal. The right ventricular size is normal. There is normal pulmonary artery systolic pressure.  3. Compared with the echo 01/2021, pericardial effusion is larger. Large pericardial effusion measuring up to 4.2 cm. Large pericardial effusion. The pericardial effusion is circumferential. Findings are consistent with cardiac tamponade.  4. The mitral valve is normal in structure. No evidence of mitral valve regurgitation. No evidence of mitral stenosis.  5. The aortic valve is tricuspid. Aortic valve regurgitation is not visualized. No aortic stenosis is present.  6. The inferior vena cava is normal in size with <50% respiratory variability, suggesting right atrial pressure of 8 mmHg. FINDINGS  Left Ventricle: Left ventricular ejection fraction, by estimation, is 60 to 65%. The left ventricle has normal function. The left ventricle has no regional wall motion abnormalities. The left ventricular internal cavity size was normal in size. There is  no left ventricular hypertrophy. Left ventricular diastolic parameters are consistent with Grade I diastolic dysfunction (impaired relaxation). Right Ventricle: The right ventricular size is normal. No increase in right ventricular wall thickness. Right ventricular systolic function is normal. There is normal pulmonary artery systolic pressure. The tricuspid regurgitant velocity is 1.94 m/s, and  with an assumed right atrial pressure of 8 mmHg, the estimated right ventricular systolic pressure is 73.5 mmHg. Left Atrium: Left atrial size was normal in size. Right Atrium: Right atrial size was normal in size. Pericardium: Compared with the echo 01/2021, pericardial effusion is larger. Large pericardial effusion measuring up to 4.2 cm. A large pericardial effusion is present. The pericardial effusion is  circumferential. There is diastolic collapse of the right ventricular free wall, diastolic collapse of the right atrial wall and excessive respiratory variation in the mitral valve spectral Doppler velocities. There is evidence of cardiac tamponade. Mitral Valve: The mitral valve is normal in structure. No evidence of mitral valve regurgitation. No evidence of mitral valve stenosis. Tricuspid Valve: The tricuspid valve is normal in structure. Tricuspid valve regurgitation is mild . No evidence of tricuspid stenosis. Aortic Valve: The aortic valve is tricuspid. Aortic valve regurgitation is not visualized. No aortic stenosis is present. Pulmonic Valve: The pulmonic valve was normal in structure. Pulmonic valve regurgitation is not visualized. No evidence of pulmonic stenosis. Aorta: The aortic root is normal in size and structure. Venous: The inferior vena cava is normal in size with less than 50% respiratory variability, suggesting right atrial pressure of 8 mmHg. IAS/Shunts: No atrial level shunt detected by color flow Doppler.  LEFT VENTRICLE PLAX 2D LVOT diam:     1.90 cm LV SV:         33 LV SV Index:   23 LVOT Area:     2.84 cm  AORTIC VALVE LVOT Vmax:   93.20 cm/s LVOT Vmean:  62.300 cm/s LVOT VTI:    0.118 m MITRAL VALVE               TRICUSPID VALVE MV Area (PHT): 4.86 cm    TR Peak grad:   15.1 mmHg MV Decel Time: 156 msec    TR Vmax:        194.00 cm/s MV E velocity: 43.30 cm/s MV A velocity: 58.30 cm/s  SHUNTS MV E/A ratio:  0.74        Systemic VTI:  0.12 m                            Systemic Diam: 1.90 cm Skeet Latch MD Electronically signed by Skeet Latch MD Signature Date/Time: 06/30/2021/3:43:19 PM    Final      Assessment and Plan:    Cardiac Tamponade - discussed with CT surgery and IC; will plan for emergent pericardocentesis; stayed with patient until they could be transferred - will leave in pigtail and eventually need f/u echo  Patient is ok to reverse DNR only  peri-procedurally  Stage IV lung cancer with multiorgan metastasis  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   History of breast cancer -MRI brain with interval enlargement of multiple metastatic lesions  -Continue Vimpat and Decadron per neurooncology recommendation (from last admission) - patient does not presently want further chemo  - may need to call IR for assistance with PleurX   Controlled NIDDM-2 with neuropathy: A1c 5.4%.   - hold metformin   Hypothyroidism -Continue home thyroid [place] 60 mg daily    Hyponatremia:  Stable.   Thrombocytosis Anemia, normocytic -  Likely reactive and stable from prior Hgb has improved after transfusion   NPO for procedure  Discussed at length with patient and Patrick Jupiter, her husband.  Reviewed with CT surgery, ED, and EMS  Severity of Illness: The appropriate patient status for this patient is INPATIENT. Inpatient status is judged to be reasonable and necessary in order to provide the required intensity of service to ensure the patient's safety. The patient's presenting symptoms, physical exam findings, and initial radiographic and laboratory data in the context of their chronic comorbidities is felt to place them at high risk for further clinical deterioration. Furthermore, it is not anticipated that the patient will be medically stable for discharge from the hospital within 2 midnights of admission.   * I certify that at the point of admission it is my clinical judgment that the patient will require inpatient hospital care spanning beyond 2 midnights from the point of admission due to high intensity of service, high risk for further deterioration and high frequency of surveillance required.*   For questions or updates, please contact [place] Please consult [email or web address] for contact info under   CRITICAL CARE Performed by: Iram Lundberg A  Isidora Laham  Total critical care time: 60 minutes. Critical care time was exclusive of separately billable procedures and treating other patients. Critical care was necessary to treat or prevent imminent or life-threatening deterioration. Critical care was time spent personally by me on the following activities: development of treatment plan with patient and/or surrogate as well as nursing, discussions with consultants, evaluation of patient's response to treatment, examination of patient, obtaining history from patient or surrogate, ordering and performing treatments and interventions, ordering and review of laboratory studies, ordering and review of radiographic studies,  pulse oximetry and re-evaluation of patient's condition.    Signed, Rudean Haskell, [place]  06/30/2021 4:28 PM     Signed, Werner Lean, MD  06/30/2021 3:58 PM

## 2021-06-30 NOTE — Progress Notes (Signed)
°  Echocardiogram 2D Echocardiogram has been performed.  Darlina Sicilian M 06/30/2021, 3:33 PM

## 2021-06-30 NOTE — Telephone Encounter (Signed)
Called and spoke with patient who states that she is currently in the ED at [facility]. She states she is having a hard time breathing and that her Pleurx tube is not draining. Patient and her husband state they are only getting maybe 10 ml a day. She wanted to let Dr. Valeta Harms know what is going on. Will route to him as FYI. Nothing further needed at this time.

## 2021-06-30 NOTE — Progress Notes (Signed)
Patient has again called and cancelled her appointment where she was supposed to initiate treatment. Patient's husband called and stated that she didn't feel well. Desk nurse attempted call back but didn't make contact.  Oncology Nurse Navigator Documentation  Oncology Nurse Navigator Flowsheets 06/30/2021  Abnormal Finding Date -  Confirmed Diagnosis Date -  Diagnosis Status -  Navigator Follow Up Date: -  Navigator Follow Up Reason: -  Navigation Complete Date: -  Post Navigation: Continue to Follow Patient? 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

## 2021-06-30 NOTE — Interval H&P Note (Signed)
History and Physical Interval Note:  06/30/2021 4:35 PM  Tami Stone  has presented today for surgery, with the diagnosis of chest pain.  The various methods of treatment have been discussed with the patient and family. After consideration of risks, benefits and other options for treatment, the patient has consented to  Procedure(s): PERICARDIOCENTESIS (N/A) as a surgical intervention.  The patient's history has been reviewed, patient examined, no change in status, stable for surgery.  I have reviewed the patient's chart and labs.  Questions were answered to the patient's satisfaction.     [name]

## 2021-07-01 ENCOUNTER — Encounter (HOSPITAL_COMMUNITY): Payer: Self-pay | Admitting: Cardiovascular Disease

## 2021-07-01 ENCOUNTER — Inpatient Hospital Stay (HOSPITAL_COMMUNITY): Payer: Medicare Other

## 2021-07-01 DIAGNOSIS — R000 Tachycardia, unspecified: Secondary | ICD-10-CM | POA: Diagnosis not present

## 2021-07-01 DIAGNOSIS — I959 Hypotension, unspecified: Secondary | ICD-10-CM

## 2021-07-01 DIAGNOSIS — I3139 Other pericardial effusion (noninflammatory): Secondary | ICD-10-CM | POA: Diagnosis not present

## 2021-07-01 DIAGNOSIS — I314 Cardiac tamponade: Secondary | ICD-10-CM | POA: Diagnosis not present

## 2021-07-01 LAB — HEMOGLOBIN A1C
Hgb A1c MFr Bld: 5.5 % (ref 4.8–5.6)
Mean Plasma Glucose: 111.15 mg/dL

## 2021-07-01 LAB — BASIC METABOLIC PANEL
Anion gap: 8 (ref 5–15)
BUN: 23 mg/dL (ref 8–23)
CO2: 25 mmol/L (ref 22–32)
Calcium: 8 mg/dL — ABNORMAL LOW (ref 8.9–10.3)
Chloride: 94 mmol/L — ABNORMAL LOW (ref 98–111)
Creatinine, Ser: 0.61 mg/dL (ref 0.44–1.00)
GFR, Estimated: >60 mL/min (ref >60)
Glucose, Bld: 150 mg/dL — ABNORMAL HIGH (ref 70–99)
Potassium: 3.6 mmol/L (ref 3.5–5.1)
Sodium: 127 mmol/L — ABNORMAL LOW (ref 135–145)

## 2021-07-01 LAB — CBC
HCT: 34.1 % — ABNORMAL LOW (ref 36.0–46.0)
Hemoglobin: 11.4 g/dL — ABNORMAL LOW (ref 12.0–15.0)
MCH: 28 pg (ref 26.0–34.0)
MCHC: 33.4 g/dL (ref 30.0–36.0)
MCV: 83.8 fL (ref 80.0–100.0)
Platelets: 600 10*3/uL — ABNORMAL HIGH (ref 150–400)
RBC: 4.07 MIL/uL (ref 3.87–5.11)
RDW: 18.1 % — ABNORMAL HIGH (ref 11.5–15.5)
WBC: 17.5 10*3/uL — ABNORMAL HIGH (ref 4.0–10.5)
nRBC: 0 % (ref 0.0–0.2)

## 2021-07-01 LAB — ECHOCARDIOGRAM LIMITED
Height: 62 in
S' Lateral: 3 cm
Weight: 1760 oz

## 2021-07-01 LAB — GLUCOSE, CAPILLARY
Glucose-Capillary: 228 mg/dL — ABNORMAL HIGH (ref 70–99)
Glucose-Capillary: 132 mg/dL — ABNORMAL HIGH (ref 70–99)
Glucose-Capillary: 121 mg/dL — ABNORMAL HIGH (ref 70–99)
Glucose-Capillary: 106 mg/dL — ABNORMAL HIGH (ref 70–99)

## 2021-07-01 LAB — PROTEIN, BODY FLUID (OTHER): Total Protein, Body Fluid Other: 4.9 g/dL

## 2021-07-01 LAB — LD, BODY FLUID (OTHER): LD, Body Fluid: 424 IU/L

## 2021-07-01 LAB — GLUCOSE, BODY FLUID OTHER: Glucose, Body Fluid Other: <2 mg/dL

## 2021-07-01 LAB — LACTIC ACID, PLASMA: Lactic Acid, Venous: 1.3 mmol/L (ref 0.5–1.9)

## 2021-07-01 LAB — CYTOLOGY - NON PAP

## 2021-07-01 LAB — TROPONIN I (HIGH SENSITIVITY): Troponin I (High Sensitivity): 13 ng/L (ref <18)

## 2021-07-01 MED ORDER — HYDROMORPHONE HCL 1 MG/ML IJ SOLN
0.5000 mg | INTRAMUSCULAR | Status: AC | PRN
  Administered 2021-07-01 – 2021-07-04 (×3): 0.5 mg via INTRAVENOUS
  Filled 2021-07-01 (×3): qty 0.5

## 2021-07-01 MED ORDER — CHLORHEXIDINE GLUCONATE CLOTH 2 % EX PADS
6.0000 | MEDICATED_PAD | Freq: Every day | CUTANEOUS | Status: DC
  Administered 2021-07-01 – 2021-07-07 (×6): 6 via TOPICAL

## 2021-07-01 MED ORDER — SODIUM CHLORIDE 0.9% FLUSH
10.0000 mL | Freq: Three times a day (TID) | INTRAVENOUS | Status: DC
  Administered 2021-07-01 – 2021-07-07 (×18): 10 mL

## 2021-07-01 MED ORDER — SODIUM CHLORIDE 0.9 % IV SOLN
Freq: Once | INTRAVENOUS | Status: AC
  Administered 2021-07-01: 500 mL via INTRAVENOUS

## 2021-07-01 MED ORDER — ALBUMIN HUMAN 5 % IV SOLN
12.5000 g | Freq: Once | INTRAVENOUS | Status: AC
  Administered 2021-07-01: 12.5 g via INTRAVENOUS
  Filled 2021-07-01: qty 250

## 2021-07-01 MED ORDER — ENOXAPARIN SODIUM 30 MG/0.3ML IJ SOSY
30.0000 mg | PREFILLED_SYRINGE | INTRAMUSCULAR | Status: DC
  Administered 2021-07-02 – 2021-07-08 (×7): 30 mg via SUBCUTANEOUS
  Filled 2021-07-01 (×7): qty 0.3

## 2021-07-01 MED ORDER — GLUCERNA SHAKE PO LIQD
237.0000 mL | Freq: Two times a day (BID) | ORAL | Status: DC
  Administered 2021-07-01 – 2021-07-02 (×3): 237 mL via ORAL

## 2021-07-01 MED ORDER — POTASSIUM CHLORIDE CRYS ER 20 MEQ PO TBCR
40.0000 meq | EXTENDED_RELEASE_TABLET | Freq: Once | ORAL | Status: AC
  Administered 2021-07-01: 40 meq via ORAL
  Filled 2021-07-01: qty 2

## 2021-07-01 MED ORDER — SODIUM CHLORIDE (PF) 0.9 % IJ SOLN
10.0000 mg | Freq: Once | INTRAMUSCULAR | Status: AC
  Administered 2021-07-01: 10 mg via INTRAPLEURAL
  Filled 2021-07-01: qty 10

## 2021-07-01 MED ORDER — STERILE WATER FOR INJECTION IJ SOLN
5.0000 mg | Freq: Once | RESPIRATORY_TRACT | Status: AC
  Administered 2021-07-01: 5 mg via INTRAPLEURAL
  Filled 2021-07-01: qty 5

## 2021-07-01 MED ORDER — LACOSAMIDE 50 MG PO TABS
50.0000 mg | ORAL_TABLET | Freq: Two times a day (BID) | ORAL | Status: DC
  Administered 2021-07-01 – 2021-07-08 (×13): 50 mg via ORAL
  Filled 2021-07-01 (×14): qty 1

## 2021-07-01 NOTE — Procedures (Signed)
Pleural Fibrinolytic Administration Procedure Note  Tami Stone  [id number]  December 16, 1950  Date:07/01/21  Time:1:44 PM   Provider Performing:Brix Brearley L [name]   Procedure: Pleural Fibrinolysis Initial day (05183)  Indication(s) Fibrinolysis of complicated pleural effusion  Consent Risks of the procedure as well as the alternatives and risks of each were explained to the patient and/or caregiver.  Consent for the procedure was obtained.  Anesthesia None  Time Out Verified patient identification, verified procedure, site/side was marked, verified correct patient position, special equipment/implants available, medications/allergies/relevant history reviewed, required imaging and test results available.  Sterile Technique Hand hygiene, gloves  Procedure Description Existing pleural catheter was cleaned and accessed in sterile manner.  10mg  of tPA in 30cc of saline and 5mg  of dornase in 30cc of sterile water were injected into pleural space using existing pleural catheter.  Catheter will be clamped for 1 hour and then placed back to suction.  Complications/Tolerance None; patient tolerated the procedure well.  EBL None  Specimen(s) None  [name], DO [place] [facility] 07/01/2021 1:44 PM

## 2021-07-01 NOTE — Consult Note (Signed)
[name], MRN:  [id number], DOB:  Feb 12, 1951, LOS: 1 ADMISSION DATE:  06/30/2021 CONSULTATION DATE:  07/01/2021 REFERRING MD:  Fletcher Anon - [facility] CHIEF COMPLAINT:  Malignant pleural effusion, PleurX catheter dysfunction   History of Present Illness:  71 year old woman who presented to [facility] 1/18 for SOB, found to have pericardial effusion with cardiac tamponade. 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 Recent admission 1/11 - 1/14 for worsening vasogenic edema in the setting of brain metastasis.  Presented to [facility] 1/18 for worsening SOB after she noted that her PleurX catheter was not draining well. Denies CP/pressure or tightness on admission. Endorses palpitations. Initial concern for PE in the setting of malignancy; CTA Chest was obtained and demonstrated worsened L pleural effusion and pericardial effusion with RV collapse concern for cardiac tamponade. Cardiology was consulted. Patient underwent emergently pericardiocentesis 1/18.  PCCM consulted 1/19 for ongoing malignant pleural effusion/PleurX catheter dysfunction.  Pertinent Medical History:   Past Medical History:  Diagnosis Date   Diabetes ([place])    Does not check bs at home   Dyspnea    High cholesterol    Lung cancer ([place])    04/2020   Significant Hospital Events: Including procedures, antibiotic start and stop dates in addition to other pertinent events   1/18 - Presented to [facility] for SOB. CTA Chest negative for PE, showed worsening pleural effusion and pericardial effusion with RV collapse/concern for cardiac tamponade. S/p pericardiocentesis 1/18. 1/19 - PCCM consulted for PleurX catheter dysfunction in the setting of malignant pleural effusion  Interim History / Subjective:  Fatigued, reporting baseline dyspnea/SOB  Objective:  Blood pressure 94/72, pulse  (!) 123, temperature 97.7 F (36.5 C), temperature source Oral, resp. rate (!) 30, height 5\' 2"  (1.575 m), weight 49.9 kg, SpO2 95 %.        Intake/Output Summary (Last 24 hours) at 07/01/2021 1014 Last data filed at 07/01/2021 0800 Gross per 24 hour  Intake 1035.92 ml  Output 1010 ml  Net 25.92 ml   Filed Weights   06/30/21 1047  Weight: 49.9 kg   Physical Examination: General: Chronically ill-appearing woman in NAD. Cachectic. HEENT: Tami Stone/AT, anicteric sclera, PERRL, moist mucous membranes. Neuro: Awake, oriented x 4. Responds to verbal stimuli. Following commands consistently. Moves all 4 extremities spontaneously.  CV: RRR, no m/g/r. PULM: Breathing even and unlabored on 2LNC. Lung fields diminished bilaterally, L > R. Splinting with attempted deep breaths. PleurX catheter to lateral L chest, not draining. GI: Soft, nontender, nondistended. Normoactive bowel sounds. Extremities: No LE edema noted. Skin: Warm/dry, no rashes.  Resolved Hospital Problem List:    Assessment & Plan:  Tami Stone is seen in consultation at the request of [place] for further evaluation and management of malignant effusion and PleurX catheter dysfunction.  Malignant pleural effusion Malfunctioning PleurX catheter CCM consulted for PleurX catheter dysfunction. Initially placed 05/28/2021 by Dr. Valeta Harms for loculated malignant L pleural effusion. S/p thoracentesis. Path c/w adenocarcinoma. - PleurX drainage system converted to [place] with adapter - Plan for tPA/dornase instillation today, 1/19 - Reassessment post-lytics   Cardiac tamponade Malignant pericardial effusion Presented 1/18 with SOB. CTA Chest negative for PE, but demonstrated worsening pleural effusion/pericardial effusion. RV collapse noted with c/f tamponade. - S/p pericardiocentesis 1/18 - Hemovac drain remains in place  NSCLC, adenocarcinoma with metastasis Stage 3 IDC of L breast Brain metastases c/b vasogenic edema On Femara,  Decadron, Vimpat - Continue Decadron, Vimpat - Per Oncology note last admission, patient is not interested in chemo/immunotherapy - Undecided about brain XRT  Thank you for this consult. PCCM will continue to follow with you.  Best Practice: (right click and "Reselect all SmartList Selections" daily)   Per Primary Team  Labs:  CBC: Recent Labs  Lab 06/25/21 0455 06/26/21 0633 06/30/21 1103 07/01/21 0050  WBC 12.7* 12.6* 16.6* 17.5*  NEUTROABS  --  8.7* 14.1*  --   HGB 8.3* 8.4* 10.9* 11.4*  HCT 27.6* 27.5* 34.9* 34.1*  MCV 85.7 85.9 86.8 83.8  PLT 743* 729* 686* 127*   Basic Metabolic Panel: Recent Labs  Lab 06/25/21 0455 06/26/21 0633 06/30/21 1103 07/01/21 0050  NA 134* 135 129* 127*  K 3.9 4.3 4.3 3.6  CL 94* 96* 92* 94*  CO2 26 28 26 25   GLUCOSE 163* 124* 116* 150*  BUN 12 10 31* 23  CREATININE <0.30* <0.30* 0.78 0.61  CALCIUM 8.4* 8.8* 8.4* 8.0*  MG 1.9  --   --   --   PHOS 1.9*  --   --   --    GFR: Estimated Creatinine Clearance: 50.8 mL/min (by C-G formula based on SCr of 0.61 mg/dL). Recent Labs  Lab 06/25/21 0455 06/26/21 0633 06/30/21 1103 07/01/21 0050  PROCALCITON  --   --  0.28  --   WBC 12.7* 12.6* 16.6* 17.5*  LATICACIDVEN  --   --  2.4* 1.3   Liver Function Tests: Recent Labs  Lab 06/25/21 0455 06/26/21 0633  AST  --  13*  ALT  --  12  ALKPHOS  --  82  BILITOT  --  0.3  PROT  --  6.6  ALBUMIN 2.2* 2.4*   No results for input(s): LIPASE, AMYLASE in the last 168 hours. No results for input(s): AMMONIA in the last 168 hours.  ABG:    Component Value Date/Time   TCO2 27 05/28/2021 0729    Coagulation Profile: No results for input(s): INR, PROTIME in the last 168 hours.  Cardiac Enzymes: No results for input(s): CKTOTAL, CKMB, CKMBINDEX, TROPONINI in the last 168 hours.  HbA1C: Hgb A1c MFr Bld  Date/Time Value Ref Range Status  07/01/2021 12:50 AM 5.5 4.8 - 5.6 % Final    Comment:    (NOTE) Pre diabetes:           5.7%-6.4%  Diabetes:              >6.4%  Glycemic control for   <7.0% adults with diabetes   06/25/2021 04:55 AM 5.4 4.8 - 5.6 % Final    Comment:    (NOTE) Pre diabetes:          5.7%-6.4%  Diabetes:              >6.4%  Glycemic control for   <7.0% adults with diabetes    CBG: Recent Labs  Lab 06/26/21 0735 06/26/21 1126 06/26/21 1643 06/30/21 2126 07/01/21 0806  GLUCAP 137* 209* 142* 94 228*   Review of Systems:   Review of systems completed with pertinent positives/negatives outlined in above HPI.  Past Medical History:  She,  has a past medical history of Diabetes ([place]), Dyspnea, High cholesterol, and Lung cancer ([place]).   Surgical History:   Past Surgical History:  Procedure Laterality Date   ABDOMINAL HYSTERECTOMY     BREAST BIOPSY Right    BRONCHIAL BIOPSY  04/30/2020   Procedure: BRONCHIAL BIOPSIES;  Surgeon: Lamonte Sakai,  Rose Fillers, MD;  Location: [place];  Service: Cardiopulmonary;;   BRONCHIAL BRUSHINGS  04/30/2020   Procedure: BRONCHIAL BRUSHINGS;  Surgeon: Collene Gobble, MD;  Location: [facility];  Service: Cardiopulmonary;;   BRONCHIAL BRUSHINGS  01/20/2021   Procedure: BRONCHIAL BRUSHINGS;  Surgeon: Laurin Coder, MD;  Location: [facility];  Service: Endoscopy;;   BRONCHIAL WASHINGS  01/20/2021   Procedure: BRONCHIAL WASHINGS;  Surgeon: Laurin Coder, MD;  Location: [facility];  Service: Endoscopy;;   CHEST TUBE INSERTION Left 05/28/2021   Procedure: INSERTION PLEURAL DRAINAGE CATHETER;  Surgeon: [name], DO;  Location: [place];  Service: Pulmonary;  Laterality: Left;  Prior to bronchoscopy   HEMOSTASIS CONTROL  04/30/2020   Procedure: HEMOSTASIS CONTROL;  Surgeon: Collene Gobble, MD;  Location: [place];  Service: Cardiopulmonary;;   IR THORACENTESIS ASP PLEURAL SPACE W/IMG GUIDE  04/15/2021   IR THORACENTESIS ASP PLEURAL SPACE W/IMG GUIDE  05/03/2021   IR THORACENTESIS ASP PLEURAL SPACE W/IMG GUIDE  05/11/2021    PERICARDIOCENTESIS N/A 06/30/2021   Procedure: PERICARDIOCENTESIS;  Surgeon: Wellington Hampshire, MD;  Location: [place] [facility];  Service: Cardiovascular;  Laterality: N/A;   TUBAL LIGATION     VIDEO BRONCHOSCOPY N/A 04/30/2020   Procedure: VIDEO BRONCHOSCOPY WITH FLUORO;  Surgeon: Collene Gobble, MD;  Location: [facility];  Service: Cardiopulmonary;  Laterality: N/A;   VIDEO BRONCHOSCOPY N/A 01/20/2021   Procedure: VIDEO BRONCHOSCOPY WITHOUT FLUORO;  Surgeon: Laurin Coder, MD;  Location: [facility];  Service: Endoscopy;  Laterality: N/A;   VIDEO BRONCHOSCOPY Left 05/28/2021   Procedure: VIDEO BRONCHOSCOPY WITHOUT FLUORO;  Surgeon: [name], DO;  Location: [place];  Service: Pulmonary;  Laterality: Left;    Social History:   reports that she has never smoked. She has never used smokeless tobacco. She reports that she does not currently use alcohol. She reports that she does not use drugs.   Family History:  Her family history includes Breast cancer in her sister. There is no history of Colon cancer or Esophageal cancer.   Allergies: No Known Allergies   Home Medications: Prior to Admission medications   Medication Sig Start Date End Date Taking? Authorizing Provider  Black Pepper-Turmeric (TURMERIC PLUS BLACK PEPPER EXT PO) Take 1 capsule by mouth in the morning, at noon, and at bedtime.   Yes [provider]  Cholecalciferol (VITAMIN D) 125 MCG (5000 UT) CAPS Take 10,000 Units by mouth every other day.   Yes [provider]  Coenzyme Q10 (CO Q 10 PO) Take 300 mg by mouth at bedtime.   Yes [provider]  DANDELION PO Take 1 tablet by mouth daily with supper.   Yes [provider]  dexamethasone (DECADRON) 4 MG tablet Take 1 tablet (4 mg total) by mouth every 12 (twelve) hours. 06/26/21 07/26/21 Yes Donne Hazel, MD  docusate sodium (COLACE) 100 MG capsule Take 1 capsule (100 mg total) by mouth 2 (two) times daily. 06/26/21  07/26/21 Yes Donne Hazel, MD  Flavoring Agent (APRICOT FLAVOR) POWD Take 2 capsules by mouth daily. Apricot Seed Supplement   Yes [provider]  gabapentin (NEURONTIN) 300 MG capsule Take 1 capsule (300 mg total) by mouth at bedtime. 06/26/21 07/26/21 Yes Donne Hazel, MD  ibuprofen (ADVIL) 200 MG tablet Take 400 mg by mouth every 6 (six) hours as needed for mild pain.   Yes [provider]  IODINE, KELP, PO Take 1 capsule by mouth in the  morning.   Yes [provider]  letrozole (FEMARA) 2.5 MG tablet Take 1 tablet (2.5 mg total) by mouth daily. 04/28/21  Yes Volanda Napoleon, MD  metFORMIN (GLUCOPHAGE) 500 MG tablet Take 1 tablet (500 mg total) by mouth 3 (three) times daily. 03/03/21  Yes Volanda Napoleon, MD  NP THYROID 60 MG tablet Take 60 mg by mouth daily. 02/24/20  Yes [provider]  polyethylene glycol (MIRALAX / GLYCOLAX) 17 g packet Take 17 g by mouth daily. 01/23/21  Yes Georgette Shell, MD  Prenatal Vit-Fe Fumarate-FA (MULTIVITAMIN-PRENATAL) 27-0.8 MG TABS tablet Take 1 tablet by mouth daily at 12 noon.   Yes [provider]  Zinc 100 MG TABS Take 100 mg by mouth daily.   Yes [provider]  lacosamide (VIMPAT) 50 MG TABS tablet Take 1 tablet (50 mg total) by mouth 2 (two) times daily. Patient not taking: Reported on 06/30/2021 06/26/21 07/26/21  Donne Hazel, MD    Critical care time: N/A   Rhae Lerner [facility] 07/01/21 10:14 AM  Please see [email or web address] for pager details.  From 7A-7P if no response, please call [phone] After hours, please call ELink [phone]

## 2021-07-01 NOTE — Progress Notes (Signed)
Medicine Consult PROGRESS NOTE    Tami Stone  LPF:[id number] DOB: [birth year] DOA: 06/30/2021 PCP: Gaynelle Cage, MD    Chief Complaint  Patient presents with   Shortness of Breath    Brief Narrative:   Tami Stone is a 71 y.o. 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 femara , h/o  Metastatic lung CA, she is sent to ED due to feeling sob, hypoxia, found to have large pericardial effusion and cardiac tamponade,  she is urgently admitted to cardiology service for pericardiocentesis and pericardiodrain placement, hospitalist consulted for medical management    Subjective:  She reports pain is not well controlled at the pericardial drain site She now also have a drain connected to her chest tube  Assessment & Plan:   Principal Problem:   Cardiac tamponade Active Problems:   Pericardial effusion   Pericardial effusion -Management per cardiology   Pleural effusion/ Pleurx malfunction  pccm consulted      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  - Oncology consulted for last admission but patient is not interested in chemotherapy or immunotherapy.  - Radiation oncology consulted from last admission who recommended whole brain XRT but the patient is undecided about radiation therapy either.   -She is planning to try holistic treatment   Dysphagia due to extrinsic compression from cancer Discussed with her regarding liquid /soft diet She would like to have regular diet so she could have more choices , regular diet ordered per patient's preference    Stage III breast cancer On femara     Noninsulin-dependent type 2 diabetes , controlled  A1c 5.4%.  On metformin at home. 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  Also has poor oral intake    Leukocytosis: Likely reactive, also has been on steroid, does not appear to have infection   Thrombocytosis : likely reactive -Continue monitoring.     -Normocytic anemia  no evidence of acute blood loss -Suspect secondary to underlying malignancy -Received 2 unit PRBC during recent admission     Overall very poor prognosis, palliative care consulted    Body mass index is 20.12 kg/m..  .   Unresulted Labs (From admission, onward)     Start     Ordered   07/07/21 0500  Creatinine, serum  (enoxaparin (LOVENOX)    CrCl < 30 ml/min)  Weekly,   R     Comments: while on enoxaparin therapy.    06/30/21 1748   07/02/21 0500  CBC  Daily,   R     Question:  Specimen collection method  Answer:  Lab=Lab collect   07/01/21 0854   07/02/21 5329  Basic metabolic panel  Daily,   R     Question:  Specimen collection method  Answer:  Lab=Lab collect   07/01/21 0854              DVT prophylaxis: enoxaparin (LOVENOX) injection 30 mg Start: 07/02/21 1000   Code Status:partial code, no intubation, yes to cpr Family Communication:  Disposition:   Status is: Inpatient  Dispo: The patient is from: home  Anticipated d/c is to: TBD              Anticipated d/c date is: TBD                Consultants:  Cardiology is primary Hospitalist as consultant Palliative care Pulmonary/critical care    Objective: Vitals:   07/01/21 0631 07/01/21 0700 07/01/21 0800 07/01/21 0900  BP: 91/70 94/72 91/69  94/72  Pulse: (!) 120 (!) 120 (!) 125 (!) 123  Resp: (!) 29 (!) 28 (!) 29 (!) 30  Temp:  97.7 F (36.5 C)    TempSrc:  Oral    SpO2:  97% 94% 95%  Weight:      Height:        Intake/Output Summary (Last 24 hours) at 07/01/2021 1443 Last data filed at 07/01/2021 0800 Gross per 24 hour  Intake 1035.92 ml  Output  1010 ml  Net 25.92 ml   Filed Weights   06/30/21 1047  Weight: 49.9 kg    Examination:  General exam: thin, frail, alert, awake, communicative Respiratory system: Diminished at left side, slightly tachypneic, no accessory muscle use, left-sided chest tube connected during.  Cardiovascular system: Sinus tachycardia Gastrointestinal system: Abdomen is nondistended, soft and nontender.  Normal bowel sounds heard. Central nervous system: Alert and oriented. No focal neurological deficits. Extremities:  no edema Skin: No rashes, lesions or ulcers Psychiatry: Judgement and insight appear normal. Mood & affect appropriate.     Data Reviewed: I have personally reviewed following labs and imaging studies  CBC: Recent Labs  Lab 06/25/21 0455 06/26/21 0633 06/30/21 1103 07/01/21 0050  WBC 12.7* 12.6* 16.6* 17.5*  NEUTROABS  --  8.7* 14.1*  --   HGB 8.3* 8.4* 10.9* 11.4*  HCT 27.6* 27.5* 34.9* 34.1*  MCV 85.7 85.9 86.8 83.8  PLT 743* 729* 686* 600*    Basic Metabolic Panel: Recent Labs  Lab 06/25/21 0455 06/26/21 0633 06/30/21 1103 07/01/21 0050  NA 134* 135 129* 127*  K 3.9 4.3 4.3 3.6  CL 94* 96* 92* 94*  CO2 26 28 26 25   GLUCOSE 163* 124* 116* 150*  BUN 12 10 31* 23  CREATININE <0.30* <0.30* 0.78 0.61  CALCIUM 8.4* 8.8* 8.4* 8.0*  MG 1.9  --   --   --   PHOS 1.9*  --   --   --     GFR: Estimated Creatinine Clearance: 50.8 mL/min (by C-G formula based on SCr of 0.61 mg/dL).  Liver Function Tests: Recent Labs  Lab 06/25/21 0455 06/26/21 0633  AST  --  13*  ALT  --  12  ALKPHOS  --  82  BILITOT  --  0.3  PROT  --  6.6  ALBUMIN 2.2* 2.4*    CBG: Recent Labs  Lab 06/26/21 1126 06/26/21 1643 06/30/21 2126 07/01/21 0806 07/01/21 1141  GLUCAP 209* 142* 94 228* 132*     Recent Results (from the past 240 hour(s))  Resp Panel by RT-PCR (Flu A&B, Covid) Nasopharyngeal Swab     Status: None   Collection Time: 06/23/21  4:20 PM   Specimen:  Nasopharyngeal Swab; Nasopharyngeal(NP) swabs in vial transport medium  Result Value Ref Range Status   SARS Coronavirus 2 by RT PCR NEGATIVE NEGATIVE Final    Comment: (NOTE) SARS-CoV-2 target nucleic acids are NOT DETECTED.  The SARS-CoV-2 RNA is generally detectable in upper respiratory specimens during the acute phase of infection. The lowest concentration of SARS-CoV-2 viral copies this assay can detect is  138 copies/mL. A negative result does not preclude SARS-Cov-2 infection and should not be used as the sole basis for treatment or other patient management decisions. A negative result may occur with  improper specimen collection/handling, submission of specimen other than nasopharyngeal swab, presence of viral mutation(s) within the areas targeted by this assay, and inadequate number of viral copies(<138 copies/mL). A negative result must be combined with clinical observations, patient history, and epidemiological information. The expected result is Negative.  Fact Sheet for Patients:  EntrepreneurPulse.com.au  Fact Sheet for Healthcare Providers:  IncredibleEmployment.be  This test is no t yet approved or cleared by the [place] FDA and  has been authorized for detection and/or diagnosis of SARS-CoV-2 by FDA under an Emergency Use Authorization (EUA). This EUA will remain  in effect (meaning this test can be used) for the duration of the COVID-19 declaration under Section 564(b)(1) of the Act, 21 U.S.C.section 360bbb-3(b)(1), unless the authorization is terminated  or revoked sooner.       Influenza A by PCR NEGATIVE NEGATIVE Final   Influenza B by PCR NEGATIVE NEGATIVE Final    Comment: (NOTE) The Xpert Xpress SARS-CoV-2/FLU/RSV plus assay is intended as an aid in the diagnosis of influenza from Nasopharyngeal swab specimens and should not be used as a sole basis for treatment. Nasal washings and aspirates are unacceptable for  Xpert Xpress SARS-CoV-2/FLU/RSV testing.  Fact Sheet for Patients: EntrepreneurPulse.com.au  Fact Sheet for Healthcare Providers: IncredibleEmployment.be  This test is not yet approved or cleared by the [place] FDA and has been authorized for detection and/or diagnosis of SARS-CoV-2 by FDA under an Emergency Use Authorization (EUA). This EUA will remain in effect (meaning this test can be used) for the duration of the COVID-19 declaration under Section 564(b)(1) of the Act, 21 U.S.C. section 360bbb-3(b)(1), unless the authorization is terminated or revoked.  Performed at [facility], [place] [address], [place], [place] [address]   Culture, blood (routine x 2)     Status: None (Preliminary result)   Collection Time: 06/30/21 11:03 AM   Specimen: BLOOD  Result Value Ref Range Status   Specimen Description   Final    BLOOD LEFT ANTECUBITAL Performed at [facility] [address], [place], [place] [address]    Special Requests   Final    Blood Culture adequate volume BOTTLES DRAWN AEROBIC AND ANAEROBIC Performed at [facility] [address] [place], [place] [address]    Culture   Final    NO GROWTH < 24 HOURS Performed at [facility] [address], [place], [place] [address]    Report Status PENDING  Incomplete  Culture, blood (routine x 2)     Status: None (Preliminary result)   Collection Time: 06/30/21 11:08 AM   Specimen: BLOOD  Result Value Ref Range Status   Specimen Description   Final    BLOOD BLOOD LEFT HAND Performed at [facility] [address], [place], [place] [address]    Special Requests   Final    Blood Culture results may not be optimal due to an inadequate volume of blood received in culture bottles BOTTLES DRAWN AEROBIC AND ANAEROBIC Performed at [facility], [place] [address], [place], [place] [address]     Culture   Final    NO GROWTH < 24 HOURS Performed at [facility] [address] [place], [place] [address]    Report Status PENDING  Incomplete  Resp Panel by RT-PCR (Flu A&B, Covid)     Status: None   Collection Time: 06/30/21 12:41 PM   Specimen: Nasopharyngeal(NP) swabs in vial transport medium  Result Value Ref Range Status   SARS Coronavirus 2 by RT PCR NEGATIVE NEGATIVE Final    Comment: (NOTE) SARS-CoV-2 target nucleic acids are NOT DETECTED.  The SARS-CoV-2 RNA is generally detectable in upper respiratory specimens during the acute phase of infection. The lowest concentration of SARS-CoV-2 viral copies this assay can detect is 138 copies/mL. A negative result does not preclude SARS-Cov-2 infection and should not be used as the sole basis for treatment or other patient management decisions. A negative result may occur with  improper specimen collection/handling, submission of specimen other than nasopharyngeal swab, presence of viral mutation(s) within the areas targeted by this assay, and inadequate number of viral copies(<138 copies/mL). A negative result must be combined with clinical observations, patient history, and epidemiological information. The expected result is Negative.  Fact Sheet for Patients:  EntrepreneurPulse.com.au  Fact Sheet for Healthcare Providers:  IncredibleEmployment.be  This test is no t yet approved or cleared by the [place] FDA and  has been authorized for detection and/or diagnosis of SARS-CoV-2 by FDA under an Emergency Use Authorization (EUA). This EUA will remain  in effect (meaning this test can be used) for the duration of the COVID-19 declaration under Section 564(b)(1) of the Act, 21 U.S.C.section 360bbb-3(b)(1), unless the authorization is terminated  or revoked sooner.       Influenza A by PCR NEGATIVE NEGATIVE Final   Influenza B by PCR NEGATIVE NEGATIVE Final    Comment:  (NOTE) The Xpert Xpress SARS-CoV-2/FLU/RSV plus assay is intended as an aid in the diagnosis of influenza from Nasopharyngeal swab specimens and should not be used as a sole basis for treatment. Nasal washings and aspirates are unacceptable for Xpert Xpress SARS-CoV-2/FLU/RSV testing.  Fact Sheet for Patients: EntrepreneurPulse.com.au  Fact Sheet for Healthcare Providers: IncredibleEmployment.be  This test is not yet approved or cleared by the [place] FDA and has been authorized for detection and/or diagnosis of SARS-CoV-2 by FDA under an Emergency Use Authorization (EUA). This EUA will remain in effect (meaning this test can be used) for the duration of the COVID-19 declaration under Section 564(b)(1) of the Act, 21 U.S.C. section 360bbb-3(b)(1), unless the authorization is terminated or revoked.  Performed at [facility], [place] [address] [place], [place] [address]   Culture, body fluid w Gram Stain-bottle     Status: None (Preliminary result)   Collection Time: 06/30/21  5:02 PM   Specimen: Fluid  Result Value Ref Range Status   Specimen Description FLUID PERICARDIAL  Final   Special Requests BOTTLES DRAWN AEROBIC AND ANAEROBIC  Final   Culture   Final    NO GROWTH < 24 HOURS Performed at [facility], [place] [address], [place], [place] [address]    Report Status PENDING  Incomplete  Gram stain     Status: None   Collection Time: 06/30/21  5:02 PM   Specimen: Fluid  Result Value Ref Range Status   Specimen Description FLUID PERICARDIAL  Final   Special Requests NONE  Final   Gram Stain   Final    FEW WBC PRESENT, PREDOMINANTLY MONONUCLEAR NO ORGANISMS SEEN Performed at [facility], [place] [address] [place], [place] [address]    Report Status 06/30/2021 FINAL  Final  MRSA Next Gen by PCR, Nasal  Status: None   Collection Time: 06/30/21  5:55 PM   Specimen: Nasal Mucosa; Nasal Swab  Result  Value Ref Range Status   MRSA by PCR Next Gen NOT DETECTED NOT DETECTED Final    Comment: (NOTE) The GeneXpert MRSA Assay (FDA approved for NASAL specimens only), is one component of a comprehensive MRSA colonization surveillance program. It is not intended to diagnose MRSA infection nor to guide or monitor treatment for MRSA infections. Test performance is not FDA approved in patients less than 58 years old. Performed at [facility], [place] [address] [place], [place] [address]          Radiology Studies: DG Chest 2 View  Result Date: 06/30/2021 CLINICAL DATA:  Short of breath and weakness.  Lung cancer. EXAM: CHEST - 2 VIEW COMPARISON:  06/23/2021 FINDINGS: Left basilar chest tube in place. No pneumothorax. Near complete opacification left chest due to effusion and tumor and collapse left lung. Small amount of aerated lung on the left. Heart is displaced to the right. Possible pericardial effusion. Minimal right pleural effusion.  Right lung otherwise clear. IMPRESSION: Progressive opacification left chest due to fluid, tumor, and collapse. Left chest tube in place. No pneumothorax Heart displaced to the right.  Question pericardial effusion Electronically Signed   By: Franchot Gallo M.D.   On: 06/30/2021 11:22   CT Angio Chest PE W and/or Wo Contrast  Result Date: 06/30/2021 CLINICAL DATA:  Episode of shortness of breath. Known stage IV lung cancer. EXAM: CT ANGIOGRAPHY CHEST WITH CONTRAST TECHNIQUE: Multidetector CT imaging of the chest was performed using the standard protocol during bolus administration of intravenous contrast. Multiplanar CT image reconstructions and MIPs were obtained to evaluate the vascular anatomy. RADIATION DOSE REDUCTION: This exam was performed according to the departmental dose-optimization program which includes automated exposure control, adjustment of the mA and/or kV according to patient size and/or use of iterative reconstruction technique. CONTRAST:   43mL OMNIPAQUE IOHEXOL 350 MG/ML SOLN COMPARISON:  05/05/2021 FINDINGS: Cardiovascular: New very large pericardial effusion. Recommend cardiology consultation. The heart is normal in size. Stable thoracic aorta. No dissection or focal aneurysm. The pulmonary arterial tree is well opacified. No filling defects to suggest pulmonary embolism. The left pulmonary artery branches are severely compressed because of the large left lower lobe lung mass, obstructed left lung and large complex left pleural effusion. Mediastinum/Nodes: Progressive subcarinal adenopathy. Lungs/Pleura: Complete left lower lobe atelectasis with obstruction of the left lower lobe bronchus. There is a PleurX drainage catheter in place but there is a persistent large loculated left pleural fluid collection and significant atelectasis of most of the lung. Small right pleural effusion with overlying atelectasis. No right-sided pulmonary lesions or pulmonary nodules. Upper Abdomen: No significant upper abdominal findings. Musculoskeletal: No breast masses, supraclavicular or axillary adenopathy. The bony thorax is grossly intact. Stable sclerotic lesion involving the L1 vertebral body. Review of the MIP images confirms the above findings. IMPRESSION: 1. No CT findings for pulmonary embolism. 2. New very large pericardial effusion. Recommend cardiology consultation. 3. Complete left lower lobe atelectasis with obstruction of the left lower lobe bronchus. 4. PleurX drainage catheter in place but there is a persistent large loculated left pleural fluid collection and significant atelectasis of most of the lung. 5. Progressive subcarinal adenopathy. 6. Small right pleural effusion with overlying atelectasis. 7. Stable sclerotic lesion involving the L1 vertebral body. 8. Aortic atherosclerosis. Aortic Atherosclerosis (ICD10-I70.0). Electronically Signed   By: Marijo Sanes M.D.   On: 06/30/2021  14:18   CARDIAC CATHETERIZATION  Result Date:  06/30/2021 Successful pericardiocentesis via the subxiphoid area with removal of 1065 mL of bloody fluid. 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 Recommendations: The drainage was secured in place.  Monitor output and repeat limited echocardiogram tomorrow.  Suspect that the drainage can likely be removed tomorrow if no significant output. Will use fentanyl for pain control.   ECHOCARDIOGRAM COMPLETE  Result Date: 06/30/2021    ECHOCARDIOGRAM REPORT   Patient Name:   Tami Stone Date of Exam: 06/30/2021 Medical Rec #:  [id number]          Height:       62.0 in Accession #:    [id number]         Weight:       110.0 lb Date of Birth:  [birth year]          BSA:          1.483 m Patient Age:    48 years           BP:           114/82 mmHg Patient Gender: F                  HR:           109 bpm. Exam Location:  Inpatient Procedure: 2D Echo, Cardiac Doppler and Color Doppler STAT ECHO Indications:    Pericardial effusion I31.3  History:        Patient has prior history of Echocardiogram examinations, most                 recent 01/18/2021. Risk Factors:Diabetes and Dyslipidemia. Lung                 Cancer.  Sonographer:    Darlina Sicilian RDCS Referring Phys: [id number] [facility]  Sonographer Comments: Recent Thoracentesis in Apical position. Imaging best from subcostal. IMPRESSIONS  1. Left ventricular ejection fraction, by estimation, is 60 to 65%. The left ventricle has normal function. The left ventricle has no regional wall motion abnormalities. Left ventricular diastolic parameters are consistent with Grade I diastolic dysfunction (impaired relaxation).  2. Right ventricular systolic function is normal. The right ventricular size is normal. There is normal pulmonary artery systolic pressure.  3. Compared with the echo 01/2021, pericardial effusion is larger. Large pericardial effusion measuring up to 4.2 cm. Large pericardial effusion. The pericardial effusion is  circumferential. Findings are consistent with cardiac tamponade.  4. The mitral valve is normal in structure. No evidence of mitral valve regurgitation. No evidence of mitral stenosis.  5. The aortic valve is tricuspid. Aortic valve regurgitation is not visualized. No aortic stenosis is present.  6. The inferior vena cava is normal in size with <50% respiratory variability, suggesting right atrial pressure of 8 mmHg. FINDINGS  Left Ventricle: Left ventricular ejection fraction, by estimation, is 60 to 65%. The left ventricle has normal function. The left ventricle has no regional wall motion abnormalities. The left ventricular internal cavity size was normal in size. There is  no left ventricular hypertrophy. Left ventricular diastolic parameters are consistent with Grade I diastolic dysfunction (impaired relaxation). Right Ventricle: The right ventricular size is normal. No increase in right ventricular wall thickness. Right ventricular systolic function is normal. There is normal pulmonary artery systolic pressure. The tricuspid regurgitant velocity is 1.94 m/s, and  with an assumed right atrial pressure of 8 mmHg, the estimated right ventricular systolic pressure  is 23.1 mmHg. Left Atrium: Left atrial size was normal in size. Right Atrium: Right atrial size was normal in size. Pericardium: Compared with the echo 01/2021, pericardial effusion is larger. Large pericardial effusion measuring up to 4.2 cm. A large pericardial effusion is present. The pericardial effusion is circumferential. There is diastolic collapse of the right ventricular free wall, diastolic collapse of the right atrial wall and excessive respiratory variation in the mitral valve spectral Doppler velocities. There is evidence of cardiac tamponade. Mitral Valve: The mitral valve is normal in structure. No evidence of mitral valve regurgitation. No evidence of mitral valve stenosis. Tricuspid Valve: The tricuspid valve is normal in structure.  Tricuspid valve regurgitation is mild . No evidence of tricuspid stenosis. Aortic Valve: The aortic valve is tricuspid. Aortic valve regurgitation is not visualized. No aortic stenosis is present. Pulmonic Valve: The pulmonic valve was normal in structure. Pulmonic valve regurgitation is not visualized. No evidence of pulmonic stenosis. Aorta: The aortic root is normal in size and structure. Venous: The inferior vena cava is normal in size with less than 50% respiratory variability, suggesting right atrial pressure of 8 mmHg. IAS/Shunts: No atrial level shunt detected by color flow Doppler.  LEFT VENTRICLE PLAX 2D LVOT diam:     1.90 cm LV SV:         33 LV SV Index:   23 LVOT Area:     2.84 cm  AORTIC VALVE LVOT Vmax:   93.20 cm/s LVOT Vmean:  62.300 cm/s LVOT VTI:    0.118 m MITRAL VALVE               TRICUSPID VALVE MV Area (PHT): 4.86 cm    TR Peak grad:   15.1 mmHg MV Decel Time: 156 msec    TR Vmax:        194.00 cm/s MV E velocity: 43.30 cm/s MV A velocity: 58.30 cm/s  SHUNTS MV E/A ratio:  0.74        Systemic VTI:  0.12 m                            Systemic Diam: 1.90 cm Skeet Latch MD Electronically signed by Skeet Latch MD Signature Date/Time: 06/30/2021/3:43:19 PM    Final    ECHOCARDIOGRAM LIMITED  Result Date: 07/01/2021    ECHOCARDIOGRAM LIMITED REPORT   Patient Name:   Tami Stone Date of Exam: 07/01/2021 Medical Rec #:  [id number]          Height:       62.0 in Accession #:    [id number]         Weight:       110.0 lb Date of Birth:  29-Oct-1950          BSA:          1.483 m Patient Age:    40 years           BP:           94/72 mmHg Patient Gender: F                  HR:           126 bpm. Exam Location:  Inpatient Procedure: Limited Echo Indications:    Pericardial Effusion  History:        Patient has prior history of Echocardiogram examinations, most  recent 06/30/2021.  Sonographer:    Jefferey Pica Referring Phys: [id number] ENIDPOEU A ARIDA  Sonographer  Comments: Unable to obtain images from all windows due to dressings. IMPRESSIONS  1. Limited study to assess pericardial effusion; small pericardial effusion predominantly surrounding right atrium; effusion much smaller compared to 06/30/20.  2. Left ventricular ejection fraction, by estimation, is 50 to 55%. The left ventricle has low normal function. The left ventricle has no regional wall motion abnormalities.  3. Right ventricular systolic function is normal. The right ventricular size is normal.  4. A small pericardial effusion is present.  5. The mitral valve is normal in structure.  6. The aortic valve has an indeterminant number of cusps.  7. The inferior vena cava is normal in size with greater than 50% respiratory variability, suggesting right atrial pressure of 3 mmHg. FINDINGS  Left Ventricle: Left ventricular ejection fraction, by estimation, is 50 to 55%. The left ventricle has low normal function. The left ventricle has no regional wall motion abnormalities. The left ventricular internal cavity size was normal in size. Right Ventricle: The right ventricular size is normal. Right ventricular systolic function is normal. Left Atrium: Left atrial size was normal in size. Right Atrium: Right atrial size was normal in size. Pericardium: A small pericardial effusion is present. Mitral Valve: The mitral valve is normal in structure. Tricuspid Valve: The tricuspid valve is normal in structure. Aortic Valve: The aortic valve has an indeterminant number of cusps. Venous: The inferior vena cava is normal in size with greater than 50% respiratory variability, suggesting right atrial pressure of 3 mmHg. Additional Comments: Limited study to assess pericardial effusion; small pericardial effusion predominantly surrounding right atrium; effusion much smaller compared to 06/30/20. LEFT VENTRICLE PLAX 2D LVIDd:         4.20 cm LVIDs:         3.00 cm LV PW:         1.10 cm LV IVS:        1.00 cm LVOT diam:     2.00 cm  LVOT Area:     3.14 cm  IVC IVC diam: 2.00 cm LEFT ATRIUM         Index LA diam:    3.20 cm 2.16 cm/m   AORTA Ao Root diam: 3.50 cm Ao Asc diam:  3.50 cm  SHUNTS Systemic Diam: 2.00 cm Kirk Ruths MD Electronically signed by Kirk Ruths MD Signature Date/Time: 07/01/2021/11:59:27 AM    Final    ECHOCARDIOGRAM LIMITED  Result Date: 06/30/2021    ECHOCARDIOGRAM LIMITED REPORT   Patient Name:   Tami Stone Date of Exam: 06/30/2021 Medical Rec #:  [id number]          Height:       62.0 in Accession #:    [id number]         Weight:       110.0 lb Date of Birth:  17-Sep-1950          BSA:          1.483 m Patient Age:    63 years           BP:           116/68 mmHg Patient Gender: F                  HR:           104 bpm. Exam Location:  Inpatient Procedure: Limited Echo       STAT  ECHO Reported to: DR Fletcher Anon. Indications:    pericardiocentesis  History:        Patient has prior history of Echocardiogram examinations, most                 recent 06/30/2020. Cancer.  Sonographer:    Johny Chess RDCS Referring Phys: [place]  1. Pericardiocenteisis performed. Prior to the procedure there was a large, circumferential pericardial effusion. After pericardiocentesis there was no effusion present. Resolution of right ventricular diastolic collapse.  2. Left ventricular ejection fraction, by estimation, is 60 to 65%. The left ventricle has normal function.  3. Large pericardial effusion. The pericardial effusion is circumferential. FINDINGS  Left Ventricle: Left ventricular ejection fraction, by estimation, is 60 to 65%. The left ventricle has normal function. Pericardium: A large pericardial effusion is present. The pericardial effusion is circumferential. Skeet Latch MD Electronically signed by Skeet Latch MD Signature Date/Time: 06/30/2021/7:31:10 PM    Final         Scheduled Meds:  dexamethasone  4 mg Oral Q12H   docusate sodium  100 mg Oral BID   [START ON  07/02/2021] enoxaparin (LOVENOX) injection  30 mg Subcutaneous Q24H   feeding supplement (GLUCERNA SHAKE)  237 mL Oral BID BM   gabapentin  300 mg Oral QHS   insulin aspart  0-9 Units Subcutaneous TID WC   lacosamide  50 mg Oral BID   letrozole  2.5 mg Oral Daily   polyethylene glycol  17 g Oral Daily   sodium chloride flush  10 mL Other Q8H   thyroid  60 mg Oral Daily   zinc sulfate  220 mg Oral Daily   Continuous Infusions:   LOS: 1 day   Time spent: 13mins Greater than 50% of this time was spent in counseling, explanation of diagnosis, planning of further management, and coordination of care.   Voice Recognition Viviann Spare dictation system was used to create this note, attempts have been made to correct errors. Please contact the author with questions and/or clarifications.   Florencia Reasons, MD PhD FACP [facility]  Available via Epic secure chat 7am-7pm for nonurgent issues Please page for urgent issues To page the attending provider between 7A-7P or the covering provider during after hours 7P-7A, please log into the web site [email or web address] and access using universal [place] password for that web site. If you do not have the password, please call the hospital operator.    07/01/2021, 2:43 PM

## 2021-07-01 NOTE — Progress Notes (Signed)
Echocardiogram 2D Echocardiogram has been performed.  Tami Stone 07/01/2021, 8:47 AM

## 2021-07-01 NOTE — Progress Notes (Signed)
Progress Note  Patient Name: Tami Stone Date of Encounter: 07/01/2021  Primary Cardiologist: None   Subjective   Pericardial effusion has resolved. Despite this heart rates are still elevated (more than baseline of 110) Patient notes pain with inspiration but breating is much improved.  Inpatient Medications    Scheduled Meds:  dexamethasone  4 mg Oral Q12H   docusate sodium  100 mg Oral BID   enoxaparin (LOVENOX) injection  60 mg Subcutaneous Q24H   gabapentin  300 mg Oral QHS   insulin aspart  0-9 Units Subcutaneous TID WC   letrozole  2.5 mg Oral Daily   polyethylene glycol  17 g Oral Daily   thyroid  60 mg Oral Daily   zinc sulfate  220 mg Oral Daily   Continuous Infusions:  albumin human     PRN Meds: acetaminophen, albuterol, fentaNYL (SUBLIMAZE) injection, ibuprofen, ketorolac, nitroGLYCERIN, ondansetron (ZOFRAN) IV   Vital Signs    Vitals:   07/01/21 0600 07/01/21 0630 07/01/21 0631 07/01/21 0700  BP: (!) 83/57 (!) 83/70 91/70 94/72   Pulse: (!) 123 (!) 120 (!) 120 (!) 120  Resp: (!) 21 (!) 26 (!) 29 (!) 28  Temp:    97.7 F (36.5 C)  TempSrc:    Oral  SpO2: 94%   97%  Weight:      Height:        Intake/Output Summary (Last 24 hours) at 07/01/2021 0855 Last data filed at 07/01/2021 0600 Gross per 24 hour  Intake 1035.92 ml  Output 1010 ml  Net 25.92 ml   Filed Weights   06/30/21 1047  Weight: 49.9 kg    Telemetry    Sinus tachycardia and atrial tachycardia - Personally Reviewed  ECG    Atrial tachycardia 140  - Personally Reviewed  Physical Exam   Gen: mild distress, thin malnourished female Neck: No JVD, no carotid bruit Cardiac: No Rubs or Gallops, no Murmur Respiratory: Decreased breath sounds in the left GI: Soft, nontender, non-distended  MS: No  edema;  moves all extremities Integument: Skin feels warm Neuro:  At time of evaluation, alert and oriented to person/place/time/situation  Psych: Normal affect for  situation   Labs    Chemistry Recent Labs  Lab 06/25/21 0455 06/26/21 0633 06/30/21 1103 07/01/21 0050  NA 134* 135 129* 127*  K 3.9 4.3 4.3 3.6  CL 94* 96* 92* 94*  CO2 26 28 26 25   GLUCOSE 163* 124* 116* 150*  BUN 12 10 31* 23  CREATININE <0.30* <0.30* 0.78 0.61  CALCIUM 8.4* 8.8* 8.4* 8.0*  PROT  --  6.6  --   --   ALBUMIN 2.2* 2.4*  --   --   AST  --  13*  --   --   ALT  --  12  --   --   ALKPHOS  --  82  --   --   BILITOT  --  0.3  --   --   GFRNONAA NOT CALCULATED NOT CALCULATED >60 >60  ANIONGAP 14 11 11 8      Hematology Recent Labs  Lab 06/26/21 0633 06/30/21 1103 07/01/21 0050  WBC 12.6* 16.6* 17.5*  RBC 3.20* 4.02 4.07  HGB 8.4* 10.9* 11.4*  HCT 27.5* 34.9* 34.1*  MCV 85.9 86.8 83.8  MCH 26.3 27.1 28.0  MCHC 30.5 31.2 33.4  RDW 17.4* 18.3* 18.1*  PLT 729* 686* 600*    Cardiac EnzymesNo results for input(s): TROPONINI in the last 168 hours. No  results for input(s): TROPIPOC in the last 168 hours.   BNP Recent Labs  Lab 06/30/21 1103  BNP 55.9     DDimer No results for input(s): DDIMER in the last 168 hours.   Radiology    DG Chest 2 View  Result Date: 06/30/2021 CLINICAL DATA:  Short of breath and weakness.  Lung cancer. EXAM: CHEST - 2 VIEW COMPARISON:  06/23/2021 FINDINGS: Left basilar chest tube in place. No pneumothorax. Near complete opacification left chest due to effusion and tumor and collapse left lung. Small amount of aerated lung on the left. Heart is displaced to the right. Possible pericardial effusion. Minimal right pleural effusion.  Right lung otherwise clear. IMPRESSION: Progressive opacification left chest due to fluid, tumor, and collapse. Left chest tube in place. No pneumothorax Heart displaced to the right.  Question pericardial effusion Electronically Signed   By: Franchot Gallo M.D.   On: 06/30/2021 11:22   CT Angio Chest PE W and/or Wo Contrast  Result Date: 06/30/2021 CLINICAL DATA:  Episode of shortness of breath.  Known stage IV lung cancer. EXAM: CT ANGIOGRAPHY CHEST WITH CONTRAST TECHNIQUE: Multidetector CT imaging of the chest was performed using the standard protocol during bolus administration of intravenous contrast. Multiplanar CT image reconstructions and MIPs were obtained to evaluate the vascular anatomy. RADIATION DOSE REDUCTION: This exam was performed according to the departmental dose-optimization program which includes automated exposure control, adjustment of the mA and/or kV according to patient size and/or use of iterative reconstruction technique. CONTRAST:  69mL OMNIPAQUE IOHEXOL 350 MG/ML SOLN COMPARISON:  05/05/2021 FINDINGS: Cardiovascular: New very large pericardial effusion. Recommend cardiology consultation. The heart is normal in size. Stable thoracic aorta. No dissection or focal aneurysm. The pulmonary arterial tree is well opacified. No filling defects to suggest pulmonary embolism. The left pulmonary artery branches are severely compressed because of the large left lower lobe lung mass, obstructed left lung and large complex left pleural effusion. Mediastinum/Nodes: Progressive subcarinal adenopathy. Lungs/Pleura: Complete left lower lobe atelectasis with obstruction of the left lower lobe bronchus. There is a PleurX drainage catheter in place but there is a persistent large loculated left pleural fluid collection and significant atelectasis of most of the lung. Small right pleural effusion with overlying atelectasis. No right-sided pulmonary lesions or pulmonary nodules. Upper Abdomen: No significant upper abdominal findings. Musculoskeletal: No breast masses, supraclavicular or axillary adenopathy. The bony thorax is grossly intact. Stable sclerotic lesion involving the L1 vertebral body. Review of the MIP images confirms the above findings. IMPRESSION: 1. No CT findings for pulmonary embolism. 2. New very large pericardial effusion. Recommend cardiology consultation. 3. Complete left lower  lobe atelectasis with obstruction of the left lower lobe bronchus. 4. PleurX drainage catheter in place but there is a persistent large loculated left pleural fluid collection and significant atelectasis of most of the lung. 5. Progressive subcarinal adenopathy. 6. Small right pleural effusion with overlying atelectasis. 7. Stable sclerotic lesion involving the L1 vertebral body. 8. Aortic atherosclerosis. Aortic Atherosclerosis (ICD10-I70.0). Electronically Signed   By: Marijo Sanes M.D.   On: 06/30/2021 14:18   CARDIAC CATHETERIZATION  Result Date: 06/30/2021 Successful pericardiocentesis via the subxiphoid area with removal of 1065 mL of bloody fluid. 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 Recommendations: The drainage was secured in place.  Monitor output and repeat limited echocardiogram tomorrow.  Suspect that the drainage can likely be removed tomorrow if no significant output. Will use fentanyl for pain control.   ECHOCARDIOGRAM  COMPLETE  Result Date: 06/30/2021    ECHOCARDIOGRAM REPORT   Patient Name:   Tami Stone Date of Exam: 06/30/2021 Medical Rec #:  [id number]          Height:       62.0 in Accession #:    [id number]         Weight:       110.0 lb Date of Birth:  10-19-1950          BSA:          1.483 m Patient Age:    71 years           BP:           114/82 mmHg Patient Gender: F                  HR:           109 bpm. Exam Location:  Inpatient Procedure: 2D Echo, Cardiac Doppler and Color Doppler STAT ECHO Indications:    Pericardial effusion I31.3  History:        Patient has prior history of Echocardiogram examinations, most                 recent 01/18/2021. Risk Factors:Diabetes and Dyslipidemia. Lung                 Cancer.  Sonographer:    Darlina Sicilian RDCS Referring Phys: [id number] [facility]  Sonographer Comments: Recent Thoracentesis in Apical position. Imaging best from subcostal. IMPRESSIONS  1. Left ventricular ejection fraction, by  estimation, is 60 to 65%. The left ventricle has normal function. The left ventricle has no regional wall motion abnormalities. Left ventricular diastolic parameters are consistent with Grade I diastolic dysfunction (impaired relaxation).  2. Right ventricular systolic function is normal. The right ventricular size is normal. There is normal pulmonary artery systolic pressure.  3. Compared with the echo 01/2021, pericardial effusion is larger. Large pericardial effusion measuring up to 4.2 cm. Large pericardial effusion. The pericardial effusion is circumferential. Findings are consistent with cardiac tamponade.  4. The mitral valve is normal in structure. No evidence of mitral valve regurgitation. No evidence of mitral stenosis.  5. The aortic valve is tricuspid. Aortic valve regurgitation is not visualized. No aortic stenosis is present.  6. The inferior vena cava is normal in size with <50% respiratory variability, suggesting right atrial pressure of 8 mmHg. FINDINGS  Left Ventricle: Left ventricular ejection fraction, by estimation, is 60 to 65%. The left ventricle has normal function. The left ventricle has no regional wall motion abnormalities. The left ventricular internal cavity size was normal in size. There is  no left ventricular hypertrophy. Left ventricular diastolic parameters are consistent with Grade I diastolic dysfunction (impaired relaxation). Right Ventricle: The right ventricular size is normal. No increase in right ventricular wall thickness. Right ventricular systolic function is normal. There is normal pulmonary artery systolic pressure. The tricuspid regurgitant velocity is 1.94 m/s, and  with an assumed right atrial pressure of 8 mmHg, the estimated right ventricular systolic pressure is 62.8 mmHg. Left Atrium: Left atrial size was normal in size. Right Atrium: Right atrial size was normal in size. Pericardium: Compared with the echo 01/2021, pericardial effusion is larger. Large pericardial  effusion measuring up to 4.2 cm. A large pericardial effusion is present. The pericardial effusion is circumferential. There is diastolic collapse of the right ventricular free wall, diastolic collapse of the right atrial wall and excessive respiratory variation in  the mitral valve spectral Doppler velocities. There is evidence of cardiac tamponade. Mitral Valve: The mitral valve is normal in structure. No evidence of mitral valve regurgitation. No evidence of mitral valve stenosis. Tricuspid Valve: The tricuspid valve is normal in structure. Tricuspid valve regurgitation is mild . No evidence of tricuspid stenosis. Aortic Valve: The aortic valve is tricuspid. Aortic valve regurgitation is not visualized. No aortic stenosis is present. Pulmonic Valve: The pulmonic valve was normal in structure. Pulmonic valve regurgitation is not visualized. No evidence of pulmonic stenosis. Aorta: The aortic root is normal in size and structure. Venous: The inferior vena cava is normal in size with less than 50% respiratory variability, suggesting right atrial pressure of 8 mmHg. IAS/Shunts: No atrial level shunt detected by color flow Doppler.  LEFT VENTRICLE PLAX 2D LVOT diam:     1.90 cm LV SV:         33 LV SV Index:   23 LVOT Area:     2.84 cm  AORTIC VALVE LVOT Vmax:   93.20 cm/s LVOT Vmean:  62.300 cm/s LVOT VTI:    0.118 m MITRAL VALVE               TRICUSPID VALVE MV Area (PHT): 4.86 cm    TR Peak grad:   15.1 mmHg MV Decel Time: 156 msec    TR Vmax:        194.00 cm/s MV E velocity: 43.30 cm/s MV A velocity: 58.30 cm/s  SHUNTS MV E/A ratio:  0.74        Systemic VTI:  0.12 m                            Systemic Diam: 1.90 cm Skeet Latch MD Electronically signed by Skeet Latch MD Signature Date/Time: 06/30/2021/3:43:19 PM    Final    ECHOCARDIOGRAM LIMITED  Result Date: 06/30/2021    ECHOCARDIOGRAM LIMITED REPORT   Patient Name:   Tami Stone Date of Exam: 06/30/2021 Medical Rec #:  [id number]           Height:       62.0 in Accession #:    [id number]         Weight:       110.0 lb Date of Birth:  1950-09-19          BSA:          1.483 m Patient Age:    30 years           BP:           116/68 mmHg Patient Gender: F                  HR:           104 bpm. Exam Location:  Inpatient Procedure: Limited Echo       STAT ECHO Reported to: DR Fletcher Anon. Indications:    pericardiocentesis  History:        Patient has prior history of Echocardiogram examinations, most                 recent 06/30/2020. Cancer.  Sonographer:    Johny Chess RDCS Referring Phys: [place]  1. Pericardiocenteisis performed. Prior to the procedure there was a large, circumferential pericardial effusion. After pericardiocentesis there was no effusion present. Resolution of right ventricular diastolic collapse.  2. Left ventricular ejection fraction, by estimation, is 60 to 65%. The  left ventricle has normal function.  3. Large pericardial effusion. The pericardial effusion is circumferential. FINDINGS  Left Ventricle: Left ventricular ejection fraction, by estimation, is 60 to 65%. The left ventricle has normal function. Pericardium: A large pericardial effusion is present. The pericardial effusion is circumferential. Skeet Latch MD Electronically signed by Skeet Latch MD Signature Date/Time: 06/30/2021/7:31:10 PM    Final     Cardiac Studies   Limited echo performed today, there is moderate effusion overlying the right atrium  Patient Profile     71 y.o. female stage IV cancer found to have cardiac tamponade  Assessment & Plan      Cardiac Tamponade and Pericardial effusion - returning pericardial effusion - will get echo either 1/20 or 1/21, based on this will removed pericardial drain or consider CT surgery eval for window  Hypotension and Tachycardia Atrial tachycardia Loculated pleural effusion - some of this may be related to her pleural effusion - some may be related to her  malnutrition - given albumin - appreciate IR consult - unable to give AV nodal agents - already has significant lung disease, if return to Atrial tachycardia rate 140s can try amiodarone - repeating ecg   Stage IV lung cancer with multiorgan metastasis  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 Critical care time was exclusive of separately billable procedures and treating other patients. Critical care was necessary to treat or prevent imminent or life-threatening deterioration. Critical care was time spent personally by me on the following activities: development of treatment plan with patient and/or surrogate as well as nursing, discussions with consultants, evaluation of patient's response to treatment, examination of patient, obtaining history from patient or surrogate, ordering and performing treatments and interventions, ordering and review of laboratory studies, ordering and review of radiographic studies, pulse oximetry and re-evaluation of patient's condition.    Signed, Rudean Haskell, MD [place]  07/01/2021 8:55 AM    For questions or updates, please contact [place] Please consult [email or web address] for contact info under Cardiology/STEMI.      Signed, Werner Lean, MD  07/01/2021, 8:55 AM

## 2021-07-01 NOTE — Progress Notes (Signed)
Patient became tachycardic in the 140's at 2349. Patient asymptomatic, AO4, no pain noted. 12 lead EKG obtained, cardiology fellow Dr. Mitzie Na paged.   Dr. Mitzie Na saw patient for POCUS. 500 mL bolus of NS ordered and given to patient. Verbal order from Dr. Mitzie Na to notify if patient becomes more tachycardic, MAP less than 60, or symptomatic.   Will continue to monitor.

## 2021-07-02 ENCOUNTER — Inpatient Hospital Stay (HOSPITAL_COMMUNITY): Payer: Medicare Other

## 2021-07-02 DIAGNOSIS — I3139 Other pericardial effusion (noninflammatory): Secondary | ICD-10-CM

## 2021-07-02 DIAGNOSIS — I314 Cardiac tamponade: Secondary | ICD-10-CM | POA: Diagnosis not present

## 2021-07-02 DIAGNOSIS — Z7189 Other specified counseling: Secondary | ICD-10-CM

## 2021-07-02 LAB — BASIC METABOLIC PANEL
Anion gap: 6 (ref 5–15)
Anion gap: 9 (ref 5–15)
BUN: 16 mg/dL (ref 8–23)
BUN: 18 mg/dL (ref 8–23)
CO2: 28 mmol/L (ref 22–32)
CO2: 25 mmol/L (ref 22–32)
Calcium: 8.8 mg/dL — ABNORMAL LOW (ref 8.9–10.3)
Calcium: 8.7 mg/dL — ABNORMAL LOW (ref 8.9–10.3)
Chloride: 99 mmol/L (ref 98–111)
Chloride: 98 mmol/L (ref 98–111)
Creatinine, Ser: 0.55 mg/dL (ref 0.44–1.00)
Creatinine, Ser: 0.5 mg/dL (ref 0.44–1.00)
GFR, Estimated: >60 mL/min (ref >60)
GFR, Estimated: >60 mL/min (ref >60)
Glucose, Bld: 141 mg/dL — ABNORMAL HIGH (ref 70–99)
Glucose, Bld: 190 mg/dL — ABNORMAL HIGH (ref 70–99)
Potassium: 5.6 mmol/L — ABNORMAL HIGH (ref 3.5–5.1)
Potassium: 4.7 mmol/L (ref 3.5–5.1)
Sodium: 133 mmol/L — ABNORMAL LOW (ref 135–145)
Sodium: 132 mmol/L — ABNORMAL LOW (ref 135–145)

## 2021-07-02 LAB — GLUCOSE, CAPILLARY
Glucose-Capillary: 101 mg/dL — ABNORMAL HIGH (ref 70–99)
Glucose-Capillary: 115 mg/dL — ABNORMAL HIGH (ref 70–99)
Glucose-Capillary: 114 mg/dL — ABNORMAL HIGH (ref 70–99)
Glucose-Capillary: 146 mg/dL — ABNORMAL HIGH (ref 70–99)

## 2021-07-02 LAB — ECHOCARDIOGRAM LIMITED
Height: 62 in
Weight: 1760 oz

## 2021-07-02 LAB — CBC
HCT: 35 % — ABNORMAL LOW (ref 36.0–46.0)
Hemoglobin: 10.9 g/dL — ABNORMAL LOW (ref 12.0–15.0)
MCH: 26.8 pg (ref 26.0–34.0)
MCHC: 31.1 g/dL (ref 30.0–36.0)
MCV: 86.2 fL (ref 80.0–100.0)
Platelets: 596 10*3/uL — ABNORMAL HIGH (ref 150–400)
RBC: 4.06 MIL/uL (ref 3.87–5.11)
RDW: 18 % — ABNORMAL HIGH (ref 11.5–15.5)
WBC: 16.8 10*3/uL — ABNORMAL HIGH (ref 4.0–10.5)
nRBC: 0 % (ref 0.0–0.2)

## 2021-07-02 IMAGING — DX DG CHEST 1V PORT
1 series · 2 of 2 positions shown · non-contrast
Comparison: CT chest and chest radiographs dated [DATE]

CLINICAL DATA: Recurrent left pleural effusion

EXAM:
PORTABLE CHEST 1 VIEW

[Series 1: chest · 0.14mm/px · 2 of 2 slices shown]
[im 1/2]
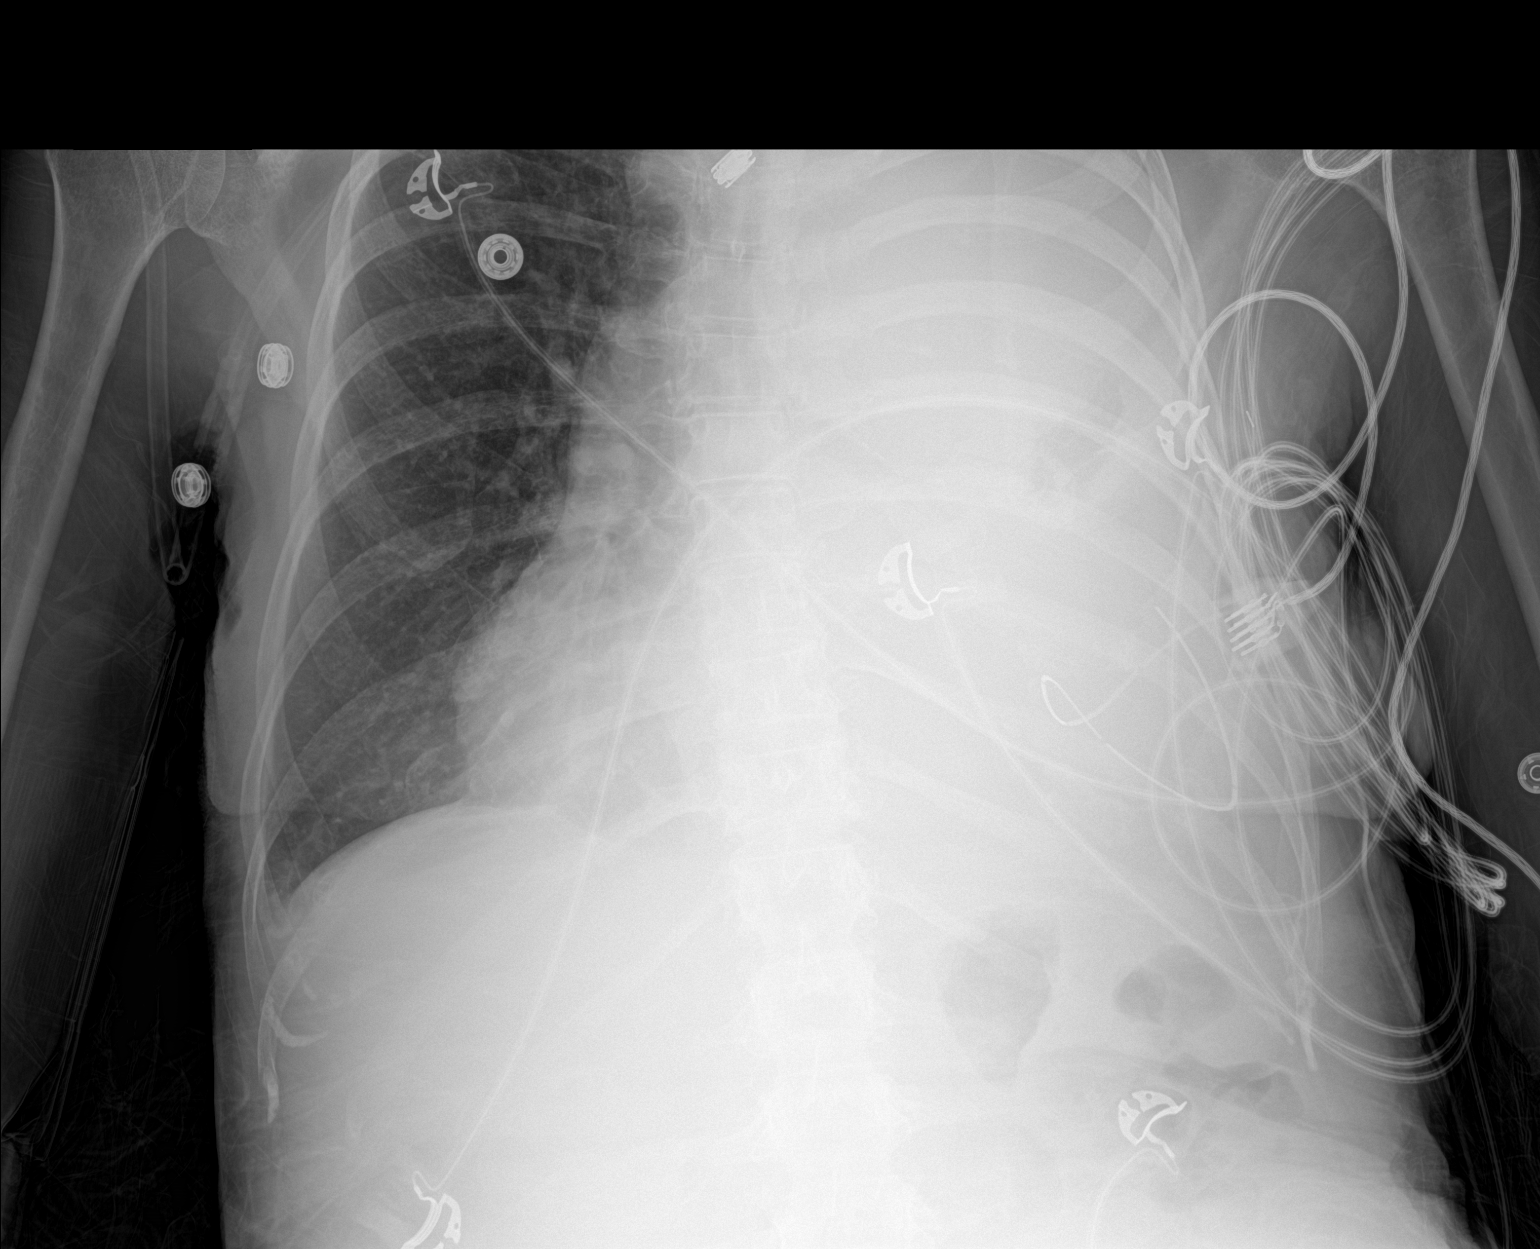
[im 2/2]
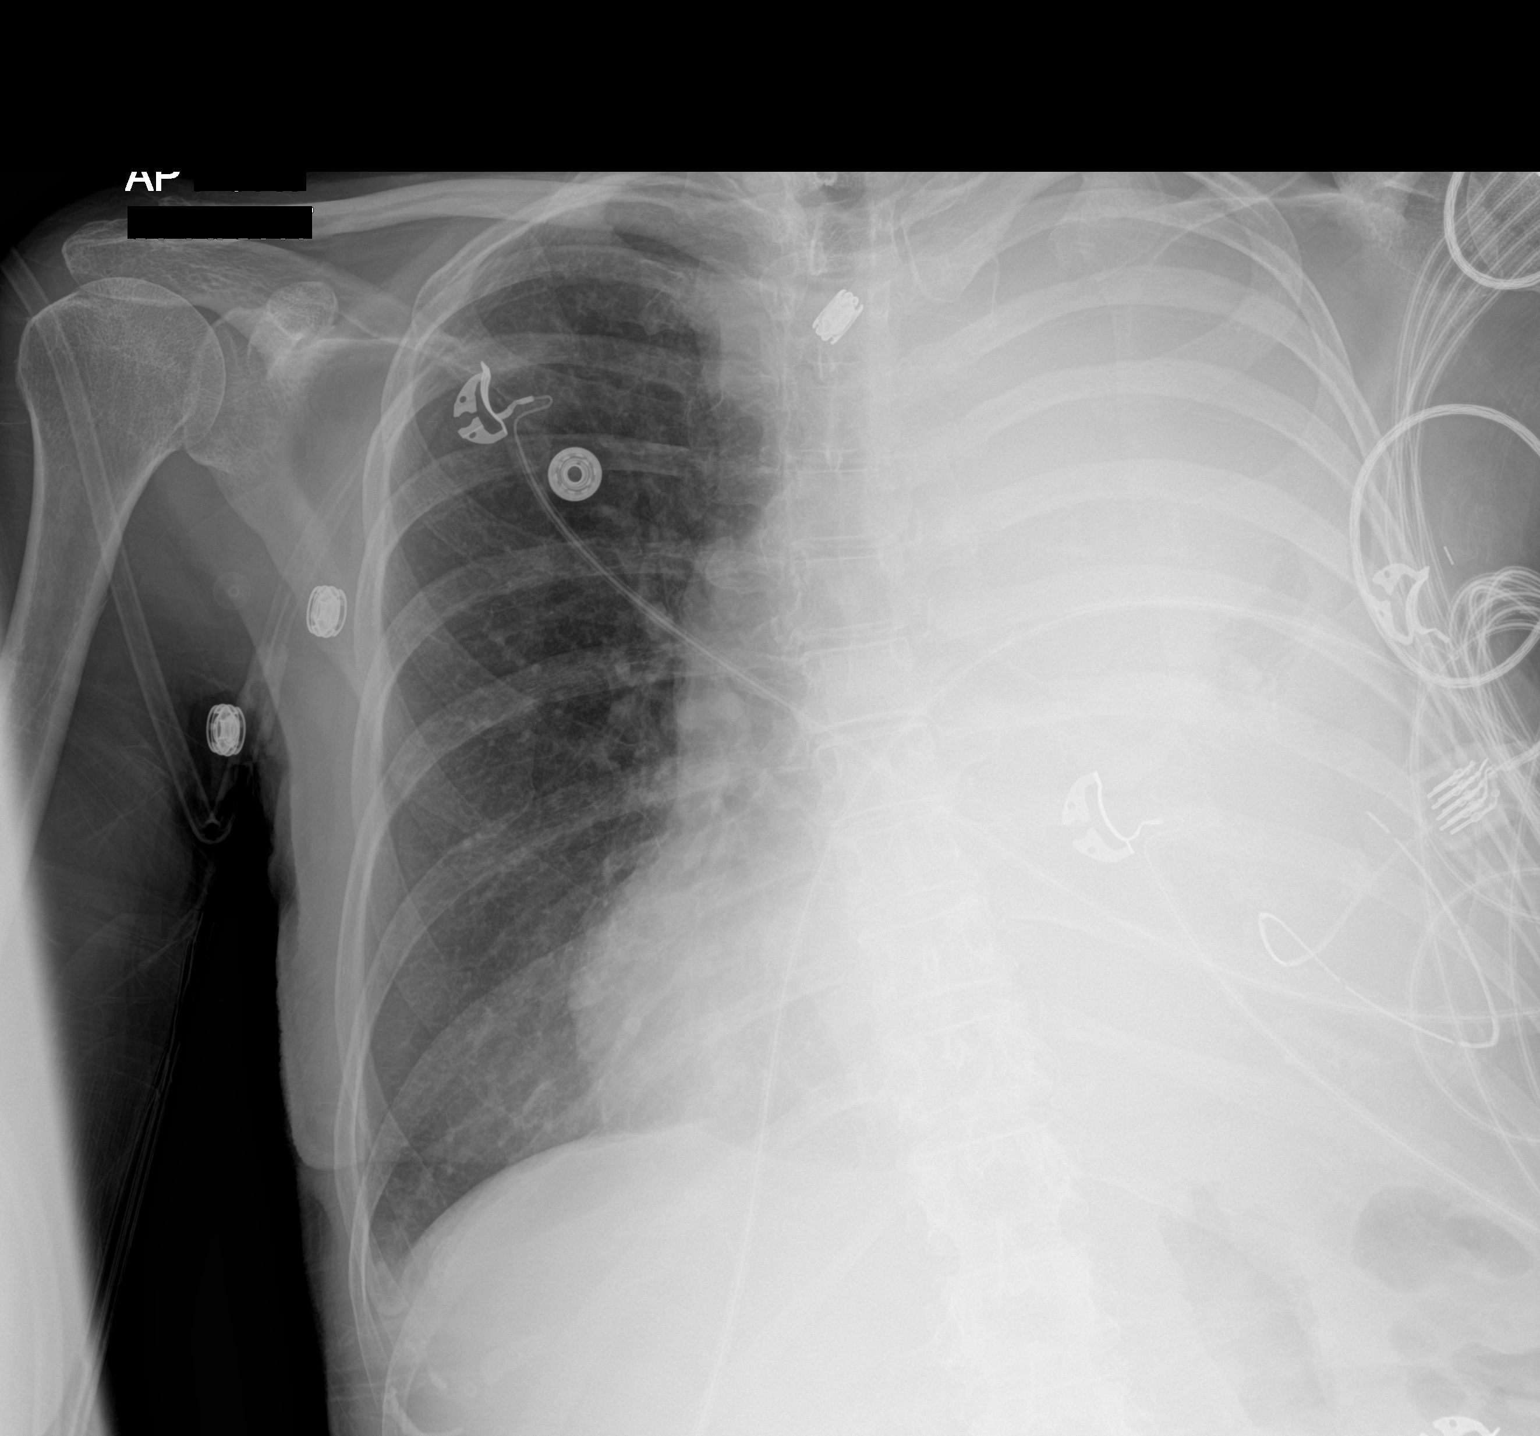

[2 of 2 positions shown; findings below may reference images not displayed]

FINDINGS: Near complete opacification of the left hemithorax due to effusion,
tumor and lung atelectasis, unchanged. Small area of aerated lungs
is also unchanged. Left-sided chest tube is present. No appreciable
pneumothorax. Mediastinal shift to the right. Right lung is clear.
No acute osseous abnormality.
IMPRESSION: No significant interval change.

## 2021-07-02 MED ORDER — HYDROMORPHONE HCL 1 MG/ML PO LIQD
2.0000 mg | ORAL | Status: DC | PRN
Start: 2021-07-02 — End: 2021-07-08
  Administered 2021-07-04 – 2021-07-08 (×5): 2 mg via ORAL
  Filled 2021-07-02 (×6): qty 2

## 2021-07-02 MED ORDER — ENSURE ENLIVE PO LIQD
237.0000 mL | Freq: Two times a day (BID) | ORAL | Status: DC
  Administered 2021-07-03: 237 mL via ORAL

## 2021-07-02 MED ORDER — AMIODARONE HCL 200 MG PO TABS
200.0000 mg | ORAL_TABLET | Freq: Every day | ORAL | Status: DC
  Administered 2021-07-02 – 2021-07-08 (×7): 200 mg via ORAL
  Filled 2021-07-02 (×7): qty 1

## 2021-07-02 MED ORDER — ADULT MULTIVITAMIN W/MINERALS CH
1.0000 | ORAL_TABLET | Freq: Every day | ORAL | Status: DC
  Administered 2021-07-03 – 2021-07-08 (×6): 1 via ORAL
  Filled 2021-07-02 (×6): qty 1

## 2021-07-02 NOTE — Progress Notes (Signed)
Progress Note  Patient Name: Tami Stone Date of Encounter: 07/02/2021  Primary Cardiologist: None   Subjective   Pericardial effusion drain output minimal this AM. BP has improved. Intermittent Atach has improved. Patient notes that she sometimes has symptoms. Patient notes that she does not light medications. Realizes how sick she is and is amenable to palliative care discussions.  Inpatient Medications    Scheduled Meds:  Chlorhexidine Gluconate Cloth  6 each Topical Daily   dexamethasone  4 mg Oral Q12H   docusate sodium  100 mg Oral BID   enoxaparin (LOVENOX) injection  30 mg Subcutaneous Q24H   feeding supplement (GLUCERNA SHAKE)  237 mL Oral BID BM   gabapentin  300 mg Oral QHS   insulin aspart  0-9 Units Subcutaneous TID WC   lacosamide  50 mg Oral BID   letrozole  2.5 mg Oral Daily   polyethylene glycol  17 g Oral Daily   sodium chloride flush  10 mL Other Q8H   thyroid  60 mg Oral Daily   zinc sulfate  220 mg Oral Daily   Continuous Infusions:   PRN Meds: acetaminophen, albuterol, fentaNYL (SUBLIMAZE) injection, HYDROmorphone (DILAUDID) injection, ibuprofen, ketorolac, nitroGLYCERIN, ondansetron (ZOFRAN) IV   Vital Signs    Vitals:   07/02/21 0543 07/02/21 0600 07/02/21 0630 07/02/21 0700  BP: 97/73 101/73 102/80 114/75  Pulse: 95 95 96 96  Resp: (!) 30 (!) 29 (!) 28 19  Temp:      TempSrc:      SpO2: 95% 96%  96%  Weight:      Height:        Intake/Output Summary (Last 24 hours) at 07/02/2021 0810 Last data filed at 07/02/2021 0500 Gross per 24 hour  Intake 10 ml  Output 610 ml  Net -600 ml   Filed Weights   06/30/21 1047  Weight: 49.9 kg    Telemetry    AT-> sinus tachycardia- Personally Reviewed  Physical Exam   Gen: mild distress, thin malnourished female Neck: No JVD, no carotid bruit Cardiac: No Rubs or Gallops, no Murmur regular tachycardia Respiratory: Decreased breath sounds in the left GI: Soft, nontender,  non-distended  MS: No  edema;  moves all extremities Integument: Skin feels warm Neuro:  At time of evaluation, alert and oriented to person/place/time/situation  Psych: Normal affect for situation   Labs    Chemistry Recent Labs  Lab 06/26/21 0633 06/30/21 1103 07/01/21 0050 07/02/21 0107 07/02/21 0254  NA 135   < > 127* 133* 132*  K 4.3   < > 3.6 5.6* 4.7  CL 96*   < > 94* 99 98  CO2 28   < > 25 28 25   GLUCOSE 124*   < > 150* 141* 190*  BUN 10   < > 23 16 18   CREATININE <0.30*   < > 0.61 0.55 0.50  CALCIUM 8.8*   < > 8.0* 8.8* 8.7*  PROT 6.6  --   --   --   --   ALBUMIN 2.4*  --   --   --   --   AST 13*  --   --   --   --   ALT 12  --   --   --   --   ALKPHOS 82  --   --   --   --   BILITOT 0.3  --   --   --   --   GFRNONAA NOT CALCULATED   < > >  60 >60 >60  ANIONGAP 11   < > 8 6 9    < > = values in this interval not displayed.     Hematology Recent Labs  Lab 06/30/21 1103 07/01/21 0050 07/02/21 0107  WBC 16.6* 17.5* 16.8*  RBC 4.02 4.07 4.06  HGB 10.9* 11.4* 10.9*  HCT 34.9* 34.1* 35.0*  MCV 86.8 83.8 86.2  MCH 27.1 28.0 26.8  MCHC 31.2 33.4 31.1  RDW 18.3* 18.1* 18.0*  PLT 686* 600* 596*    Cardiac EnzymesNo results for input(s): TROPONINI in the last 168 hours. No results for input(s): TROPIPOC in the last 168 hours.   BNP Recent Labs  Lab 06/30/21 1103  BNP 55.9     DDimer No results for input(s): DDIMER in the last 168 hours.   Radiology    DG Chest 2 View  Result Date: 06/30/2021 CLINICAL DATA:  Short of breath and weakness.  Lung cancer. EXAM: CHEST - 2 VIEW COMPARISON:  06/23/2021 FINDINGS: Left basilar chest tube in place. No pneumothorax. Near complete opacification left chest due to effusion and tumor and collapse left lung. Small amount of aerated lung on the left. Heart is displaced to the right. Possible pericardial effusion. Minimal right pleural effusion.  Right lung otherwise clear. IMPRESSION: Progressive opacification left chest  due to fluid, tumor, and collapse. Left chest tube in place. No pneumothorax Heart displaced to the right.  Question pericardial effusion Electronically Signed   By: Franchot Gallo M.D.   On: 06/30/2021 11:22   CT Angio Chest PE W and/or Wo Contrast  Result Date: 06/30/2021 CLINICAL DATA:  Episode of shortness of breath. Known stage IV lung cancer. EXAM: CT ANGIOGRAPHY CHEST WITH CONTRAST TECHNIQUE: Multidetector CT imaging of the chest was performed using the standard protocol during bolus administration of intravenous contrast. Multiplanar CT image reconstructions and MIPs were obtained to evaluate the vascular anatomy. RADIATION DOSE REDUCTION: This exam was performed according to the departmental dose-optimization program which includes automated exposure control, adjustment of the mA and/or kV according to patient size and/or use of iterative reconstruction technique. CONTRAST:  62mL OMNIPAQUE IOHEXOL 350 MG/ML SOLN COMPARISON:  05/05/2021 FINDINGS: Cardiovascular: New very large pericardial effusion. Recommend cardiology consultation. The heart is normal in size. Stable thoracic aorta. No dissection or focal aneurysm. The pulmonary arterial tree is well opacified. No filling defects to suggest pulmonary embolism. The left pulmonary artery branches are severely compressed because of the large left lower lobe lung mass, obstructed left lung and large complex left pleural effusion. Mediastinum/Nodes: Progressive subcarinal adenopathy. Lungs/Pleura: Complete left lower lobe atelectasis with obstruction of the left lower lobe bronchus. There is a PleurX drainage catheter in place but there is a persistent large loculated left pleural fluid collection and significant atelectasis of most of the lung. Small right pleural effusion with overlying atelectasis. No right-sided pulmonary lesions or pulmonary nodules. Upper Abdomen: No significant upper abdominal findings. Musculoskeletal: No breast masses,  supraclavicular or axillary adenopathy. The bony thorax is grossly intact. Stable sclerotic lesion involving the L1 vertebral body. Review of the MIP images confirms the above findings. IMPRESSION: 1. No CT findings for pulmonary embolism. 2. New very large pericardial effusion. Recommend cardiology consultation. 3. Complete left lower lobe atelectasis with obstruction of the left lower lobe bronchus. 4. PleurX drainage catheter in place but there is a persistent large loculated left pleural fluid collection and significant atelectasis of most of the lung. 5. Progressive subcarinal adenopathy. 6. Small right pleural effusion with overlying atelectasis. 7.  Stable sclerotic lesion involving the L1 vertebral body. 8. Aortic atherosclerosis. Aortic Atherosclerosis (ICD10-I70.0). Electronically Signed   By: Marijo Sanes M.D.   On: 06/30/2021 14:18   CARDIAC CATHETERIZATION  Result Date: 06/30/2021 Successful pericardiocentesis via the subxiphoid area with removal of 1065 mL of bloody fluid. 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 Recommendations: The drainage was secured in place.  Monitor output and repeat limited echocardiogram tomorrow.  Suspect that the drainage can likely be removed tomorrow if no significant output. Will use fentanyl for pain control.   ECHOCARDIOGRAM COMPLETE  Result Date: 06/30/2021    ECHOCARDIOGRAM REPORT   Patient Name:   SUHANI STILLION Date of Exam: 06/30/2021 Medical Rec #:  [id number]          Height:       62.0 in Accession #:    [id number]         Weight:       110.0 lb Date of Birth:  1951/03/14          BSA:          1.483 m Patient Age:    7 years           BP:           114/82 mmHg Patient Gender: F                  HR:           109 bpm. Exam Location:  Inpatient Procedure: 2D Echo, Cardiac Doppler and Color Doppler STAT ECHO Indications:    Pericardial effusion I31.3  History:        Patient has prior history of Echocardiogram  examinations, most                 recent 01/18/2021. Risk Factors:Diabetes and Dyslipidemia. Lung                 Cancer.  Sonographer:    Darlina Sicilian RDCS Referring Phys: [id number] [facility]  Sonographer Comments: Recent Thoracentesis in Apical position. Imaging best from subcostal. IMPRESSIONS  1. Left ventricular ejection fraction, by estimation, is 60 to 65%. The left ventricle has normal function. The left ventricle has no regional wall motion abnormalities. Left ventricular diastolic parameters are consistent with Grade I diastolic dysfunction (impaired relaxation).  2. Right ventricular systolic function is normal. The right ventricular size is normal. There is normal pulmonary artery systolic pressure.  3. Compared with the echo 01/2021, pericardial effusion is larger. Large pericardial effusion measuring up to 4.2 cm. Large pericardial effusion. The pericardial effusion is circumferential. Findings are consistent with cardiac tamponade.  4. The mitral valve is normal in structure. No evidence of mitral valve regurgitation. No evidence of mitral stenosis.  5. The aortic valve is tricuspid. Aortic valve regurgitation is not visualized. No aortic stenosis is present.  6. The inferior vena cava is normal in size with <50% respiratory variability, suggesting right atrial pressure of 8 mmHg. FINDINGS  Left Ventricle: Left ventricular ejection fraction, by estimation, is 60 to 65%. The left ventricle has normal function. The left ventricle has no regional wall motion abnormalities. The left ventricular internal cavity size was normal in size. There is  no left ventricular hypertrophy. Left ventricular diastolic parameters are consistent with Grade I diastolic dysfunction (impaired relaxation). Right Ventricle: The right ventricular size is normal. No increase in right ventricular wall thickness. Right ventricular systolic function is normal. There is normal pulmonary artery  systolic pressure. The tricuspid  regurgitant velocity is 1.94 m/s, and  with an assumed right atrial pressure of 8 mmHg, the estimated right ventricular systolic pressure is 16.6 mmHg. Left Atrium: Left atrial size was normal in size. Right Atrium: Right atrial size was normal in size. Pericardium: Compared with the echo 01/2021, pericardial effusion is larger. Large pericardial effusion measuring up to 4.2 cm. A large pericardial effusion is present. The pericardial effusion is circumferential. There is diastolic collapse of the right ventricular free wall, diastolic collapse of the right atrial wall and excessive respiratory variation in the mitral valve spectral Doppler velocities. There is evidence of cardiac tamponade. Mitral Valve: The mitral valve is normal in structure. No evidence of mitral valve regurgitation. No evidence of mitral valve stenosis. Tricuspid Valve: The tricuspid valve is normal in structure. Tricuspid valve regurgitation is mild . No evidence of tricuspid stenosis. Aortic Valve: The aortic valve is tricuspid. Aortic valve regurgitation is not visualized. No aortic stenosis is present. Pulmonic Valve: The pulmonic valve was normal in structure. Pulmonic valve regurgitation is not visualized. No evidence of pulmonic stenosis. Aorta: The aortic root is normal in size and structure. Venous: The inferior vena cava is normal in size with less than 50% respiratory variability, suggesting right atrial pressure of 8 mmHg. IAS/Shunts: No atrial level shunt detected by color flow Doppler.  LEFT VENTRICLE PLAX 2D LVOT diam:     1.90 cm LV SV:         33 LV SV Index:   23 LVOT Area:     2.84 cm  AORTIC VALVE LVOT Vmax:   93.20 cm/s LVOT Vmean:  62.300 cm/s LVOT VTI:    0.118 m MITRAL VALVE               TRICUSPID VALVE MV Area (PHT): 4.86 cm    TR Peak grad:   15.1 mmHg MV Decel Time: 156 msec    TR Vmax:        194.00 cm/s MV E velocity: 43.30 cm/s MV A velocity: 58.30 cm/s  SHUNTS MV E/A ratio:  0.74        Systemic VTI:  0.12 m                             Systemic Diam: 1.90 cm Skeet Latch MD Electronically signed by Skeet Latch MD Signature Date/Time: 06/30/2021/3:43:19 PM    Final    ECHOCARDIOGRAM LIMITED  Result Date: 07/01/2021    ECHOCARDIOGRAM LIMITED REPORT   Patient Name:   SENIA EVEN Date of Exam: 07/01/2021 Medical Rec #:  [id number]          Height:       62.0 in Accession #:    [id number]         Weight:       110.0 lb Date of Birth:  [birth year]          BSA:          1.483 m Patient Age:    71 years           BP:           94/72 mmHg Patient Gender: F                  HR:           126 bpm. Exam Location:  Inpatient Procedure: Limited Echo Indications:    Pericardial Effusion  History:  Patient has prior history of Echocardiogram examinations, most                 recent 06/30/2021.  Sonographer:    Jefferey Pica Referring Phys: [id number] WEXHBZJI A ARIDA  Sonographer Comments: Unable to obtain images from all windows due to dressings. IMPRESSIONS  1. Limited study to assess pericardial effusion; small pericardial effusion predominantly surrounding right atrium; effusion much smaller compared to 06/30/20.  2. Left ventricular ejection fraction, by estimation, is 50 to 55%. The left ventricle has low normal function. The left ventricle has no regional wall motion abnormalities.  3. Right ventricular systolic function is normal. The right ventricular size is normal.  4. A small pericardial effusion is present.  5. The mitral valve is normal in structure.  6. The aortic valve has an indeterminant number of cusps.  7. The inferior vena cava is normal in size with greater than 50% respiratory variability, suggesting right atrial pressure of 3 mmHg. FINDINGS  Left Ventricle: Left ventricular ejection fraction, by estimation, is 50 to 55%. The left ventricle has low normal function. The left ventricle has no regional wall motion abnormalities. The left ventricular internal cavity size was normal in size. Right  Ventricle: The right ventricular size is normal. Right ventricular systolic function is normal. Left Atrium: Left atrial size was normal in size. Right Atrium: Right atrial size was normal in size. Pericardium: A small pericardial effusion is present. Mitral Valve: The mitral valve is normal in structure. Tricuspid Valve: The tricuspid valve is normal in structure. Aortic Valve: The aortic valve has an indeterminant number of cusps. Venous: The inferior vena cava is normal in size with greater than 50% respiratory variability, suggesting right atrial pressure of 3 mmHg. Additional Comments: Limited study to assess pericardial effusion; small pericardial effusion predominantly surrounding right atrium; effusion much smaller compared to 06/30/20. LEFT VENTRICLE PLAX 2D LVIDd:         4.20 cm LVIDs:         3.00 cm LV PW:         1.10 cm LV IVS:        1.00 cm LVOT diam:     2.00 cm LVOT Area:     3.14 cm  IVC IVC diam: 2.00 cm LEFT ATRIUM         Index LA diam:    3.20 cm 2.16 cm/m   AORTA Ao Root diam: 3.50 cm Ao Asc diam:  3.50 cm  SHUNTS Systemic Diam: 2.00 cm Kirk Ruths MD Electronically signed by Kirk Ruths MD Signature Date/Time: 07/01/2021/11:59:27 AM    Final    ECHOCARDIOGRAM LIMITED  Result Date: 06/30/2021    ECHOCARDIOGRAM LIMITED REPORT   Patient Name:   ELWYN LOWDEN Date of Exam: 06/30/2021 Medical Rec #:  [id number]          Height:       62.0 in Accession #:    [id number]         Weight:       110.0 lb Date of Birth:  Aug 31, 1950          BSA:          1.483 m Patient Age:    37 years           BP:           116/68 mmHg Patient Gender: F                  HR:  104 bpm. Exam Location:  Inpatient Procedure: Limited Echo       STAT ECHO Reported to: DR Fletcher Anon. Indications:    pericardiocentesis  History:        Patient has prior history of Echocardiogram examinations, most                 recent 06/30/2020. Cancer.  Sonographer:    Johny Chess RDCS Referring Phys: [place]  1. Pericardiocenteisis performed. Prior to the procedure there was a large, circumferential pericardial effusion. After pericardiocentesis there was no effusion present. Resolution of right ventricular diastolic collapse.  2. Left ventricular ejection fraction, by estimation, is 60 to 65%. The left ventricle has normal function.  3. Large pericardial effusion. The pericardial effusion is circumferential. FINDINGS  Left Ventricle: Left ventricular ejection fraction, by estimation, is 60 to 65%. The left ventricle has normal function. Pericardium: A large pericardial effusion is present. The pericardial effusion is circumferential. Skeet Latch MD Electronically signed by Skeet Latch MD Signature Date/Time: 06/30/2021/7:31:10 PM    Final     Cardiac Studies   Limited echo performed today, there is moderate effusion overlying the right atrium  Patient Profile     71 y.o. 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   History of breast cance r Controlled NIDDM-2 with neuropathy  Hypothyroidism Hyponatremia:   Thrombocytosis Anemia, normocytic - appreciate consultant assistance: TRH and Palliative Care - prior notes note that PC was called, but we will consult to confirm, patient is amenable for discussions  Updated husband Velia Meyer, Rudean Haskell, [place]  07/02/2021 8:10 AM    For questions or updates, please contact [place] Please consult [email or web address]  for contact info under Cardiology/STEMI.      Signed, Werner Lean, MD  07/02/2021, 8:10 AM

## 2021-07-02 NOTE — Progress Notes (Signed)
Left detailed message for consult request on palliative care consult line per Dr. Oralia Rud request.

## 2021-07-02 NOTE — Progress Notes (Signed)
Nutrition Follow-up  DOCUMENTATION CODES:   Not applicable  INTERVENTION:    -Ensure Enlive po TID, each supplement provides 350 kcal and 20 grams of protein   -Multivitamin with minerals daily   -Continue Regular Diet  NUTRITION DIAGNOSIS:   Inadequate oral intake related to dysphagia, poor appetite, cancer and cancer related treatments as evidenced by meal completion < 25%, per patient/family report.  GOAL:   Patient will meet greater than or equal to 90% of their needs  MONITOR:   PO intake, Supplement acceptance, Labs, Weight trends  REASON FOR ASSESSMENT:   Malnutrition Screening Tool    ASSESSMENT:   71 yo female admitted with SOB and hypoxia and found to have large pericardial effusion and cardiac tamponade. Pt with NSCLC with mets to bone, brain with malignant pleural effusions requiring PleurX cath placement. PMH includes DM, high cholesterol, stage III breast cancer  Noted palliative care consulted; possible home with hospice. Pt is a DNR. Pt is undecided about brain XRT. GOC ongoing  1/18 Admitted, Pericardiocentesis 1/20 Pericardial drain removed  Unable to reach pt via phone  Pt with dysphagia related to extrinsic compression from cancer; food gets stuck, Regular diet is ordered by MD based on patient preference  Pt ate 25% at breakfast this AM; no other recorded po intake  Noted pt cares for her daughter who has been disable since she was 42 and is not 69  PleurX drain remains in place, minimal drainage  No weight since admission; admit weight appears to be stated or carried forward. Based on weight encounters, +wt loss, need measured weight  Labs: sodium 133 (L), potassium 5.4 (H) Meds: decadron, colace, ss novolog, MVI, zinc sulfate  NUTRITION - FOCUSED PHYSICAL EXAM:  Unable to assess  Diet Order:   Diet Order             Diet regular Room service appropriate? Yes; Fluid consistency: Thin  Diet effective now                    EDUCATION NEEDS:   Education needs have been addressed  Skin:  Skin Assessment: Reviewed RN Assessment  Last BM:  1/20  Height:   Ht Readings from Last 1 Encounters:  06/30/21 5\' 2"  (1.575 m)    Weight:   Wt Readings from Last 1 Encounters:  06/30/21 49.9 kg     BMI:  Body mass index is 20.12 kg/m.  Estimated Nutritional Needs:   Kcal:  1750-1950 kcals  Protein:  85-100 g  Fluid:  1.8 L   Kerman Passey MS, RDN, LDN, CNSC Registered Dietitian III Clinical Nutrition RD Pager and On-Call Pager Number Located in [place]

## 2021-07-02 NOTE — Progress Notes (Signed)
Medicine Consult PROGRESS NOTE    Tami Stone  HEN:[id number] DOB: [birth year] DOA: 06/30/2021 PCP: Gaynelle Cage, MD    Chief Complaint  Patient presents with   Shortness of Breath    Brief Narrative:   Tami Stone is a 71 y.o. 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 femara , h/o  Metastatic lung CA, she is sent to ED due to feeling sob, hypoxia, found to have large pericardial effusion and cardiac tamponade,  she is urgently admitted to cardiology service for pericardiocentesis and pericardiodrain placement, hospitalist consulted for medical management    Subjective:  She is seen earlier today, she is less tachycardic today, she is on room air in bed, working on her cell phone  Assessment & Plan:   Principal Problem:   Cardiac tamponade Active Problems:   Pericardial effusion   Pericardial effusion -Management per cardiology   Pleural effusion/ Pleurx malfunction  pccm consulted      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  - Oncology consulted for last admission but patient is not interested in chemotherapy or immunotherapy.  - Radiation oncology consulted from last admission who recommended whole brain XRT but the patient is undecided about radiation therapy either.      Dysphagia due to extrinsic compression from cancer Discussed with her regarding liquid /soft diet She would like to have regular diet so she could have more choices , regular diet ordered per patient's preference    Stage III breast cancer On femara     Noninsulin-dependent type 2 diabetes , controlled  A1c 5.4%.  On metformin at home. 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  Also has poor oral intake    Leukocytosis: Likely reactive, also has been on steroid, does not appear to have infection   Thrombocytosis : likely reactive -Continue monitoring.     -Normocytic anemia  no evidence of acute blood loss -Suspect secondary to underlying malignancy -Received 2 unit PRBC during recent admission     Overall very poor prognosis, palliative care consulted    Body mass index is 20.12 kg/m..  .   Unresulted Labs (From admission, onward)     Start     Ordered   07/07/21 0500  Creatinine, serum  (enoxaparin (LOVENOX)    CrCl < 30 ml/min)  Weekly,   R     Comments: while on enoxaparin therapy.    06/30/21 1748   07/02/21 0500  CBC  Daily,   R     Question:  Specimen collection method  Answer:  Lab=Lab collect   07/01/21 0854   07/02/21 3614  Basic metabolic panel  Daily,   R     Question:  Specimen collection method  Answer:  Lab=Lab collect   07/01/21 0854              DVT prophylaxis: enoxaparin (LOVENOX) injection 30 mg Start: 07/02/21 1000   Code Status:partial code, no intubation, yes to cpr Family Communication:  Disposition:   Status is: Inpatient  Dispo: The patient is from: home               Anticipated d/c is to:  TBD              Anticipated d/c date is: TBD                Consultants:  Cardiology is primary Hospitalist as consultant Palliative care Pulmonary/critical care    Objective: Vitals:   07/02/21 1600 07/02/21 1623 07/02/21 1700 07/02/21 1800  BP:  135/84 (!) 145/81 125/82  Pulse: (!) 102 100 (!) 107 (!) 107  Resp: (!) 32 (!) 23 17 (!) 31  Temp:      TempSrc:      SpO2: 96% 95% 96% 95%  Weight:      Height:        Intake/Output Summary (Last 24 hours) at 07/02/2021 1859 Last data filed at 07/02/2021 1130 Gross per 24 hour  Intake 20 ml  Output 600 ml  Net -580 ml   Filed Weights   06/30/21 1047  Weight: 49.9 kg     Examination:  General exam: thin, frail, alert, awake, communicative Respiratory system: Diminished at left side, slightly tachypneic, no accessory muscle use, left-sided chest tube connected during.  Cardiovascular system: Sinus tachycardia Gastrointestinal system: Abdomen is nondistended, soft and nontender.  Normal bowel sounds heard. Central nervous system: Alert and oriented. No focal neurological deficits. Extremities:  no edema Skin: No rashes, lesions or ulcers Psychiatry: Judgement and insight appear normal. Mood & affect appropriate.     Data Reviewed: I have personally reviewed following labs and imaging studies  CBC: Recent Labs  Lab 06/26/21 0633 06/30/21 1103 07/01/21 0050 07/02/21 0107  WBC 12.6* 16.6* 17.5* 16.8*  NEUTROABS 8.7* 14.1*  --   --   HGB 8.4* 10.9* 11.4* 10.9*  HCT 27.5* 34.9* 34.1* 35.0*  MCV 85.9 86.8 83.8 86.2  PLT 729* 686* 600* 596*    Basic Metabolic Panel: Recent Labs  Lab 06/26/21 0633 06/30/21 1103 07/01/21 0050 07/02/21 0107 07/02/21 0254  NA 135 129* 127* 133* 132*  K 4.3 4.3 3.6 5.6* 4.7  CL 96* 92* 94* 99 98  CO2 28 26 25 28 25   GLUCOSE 124* 116* 150* 141* 190*  BUN 10 31* 23 16 18   CREATININE <0.30* 0.78 0.61 0.55 0.50  CALCIUM 8.8* 8.4* 8.0* 8.8* 8.7*    GFR: Estimated Creatinine Clearance: 50.8 mL/min (by C-G formula based on SCr of 0.5 mg/dL).  Liver Function Tests: Recent Labs  Lab 06/26/21 0633  AST 13*  ALT 12  ALKPHOS 82  BILITOT 0.3  PROT 6.6  ALBUMIN 2.4*    CBG: Recent Labs  Lab 07/01/21 1553 07/01/21 2153 07/02/21 0608 07/02/21 1122 07/02/21 1601  GLUCAP 121* 106* 101* 115* 114*     Recent Results (from the past 240 hour(s))  Resp Panel by RT-PCR (Flu A&B, Covid) Nasopharyngeal Swab     Status: None   Collection Time: 06/23/21  4:20 PM   Specimen: Nasopharyngeal Swab; Nasopharyngeal(NP) swabs in vial transport medium  Result Value Ref Range Status   SARS Coronavirus 2 by RT PCR  NEGATIVE NEGATIVE Final    Comment: (NOTE) SARS-CoV-2 target nucleic acids are NOT DETECTED.  The SARS-CoV-2 RNA is generally detectable in upper respiratory specimens during the acute phase of infection. The lowest concentration of SARS-CoV-2 viral copies this assay can detect is 138 copies/mL. A negative result does not preclude SARS-Cov-2 infection and should not be used as the sole basis for treatment or other patient management decisions. A negative result may occur with  improper specimen collection/handling, submission of specimen other  than nasopharyngeal swab, presence of viral mutation(s) within the areas targeted by this assay, and inadequate number of viral copies(<138 copies/mL). A negative result must be combined with clinical observations, patient history, and epidemiological information. The expected result is Negative.  Fact Sheet for Patients:  EntrepreneurPulse.com.au  Fact Sheet for Healthcare Providers:  IncredibleEmployment.be  This test is no t yet approved or cleared by the [place] FDA and  has been authorized for detection and/or diagnosis of SARS-CoV-2 by FDA under an Emergency Use Authorization (EUA). This EUA will remain  in effect (meaning this test can be used) for the duration of the COVID-19 declaration under Section 564(b)(1) of the Act, 21 U.S.C.section 360bbb-3(b)(1), unless the authorization is terminated  or revoked sooner.       Influenza A by PCR NEGATIVE NEGATIVE Final   Influenza B by PCR NEGATIVE NEGATIVE Final    Comment: (NOTE) The Xpert Xpress SARS-CoV-2/FLU/RSV plus assay is intended as an aid in the diagnosis of influenza from Nasopharyngeal swab specimens and should not be used as a sole basis for treatment. Nasal washings and aspirates are unacceptable for Xpert Xpress SARS-CoV-2/FLU/RSV testing.  Fact Sheet for Patients: EntrepreneurPulse.com.au  Fact Sheet for  Healthcare Providers: IncredibleEmployment.be  This test is not yet approved or cleared by the [place] FDA and has been authorized for detection and/or diagnosis of SARS-CoV-2 by FDA under an Emergency Use Authorization (EUA). This EUA will remain in effect (meaning this test can be used) for the duration of the COVID-19 declaration under Section 564(b)(1) of the Act, 21 U.S.C. section 360bbb-3(b)(1), unless the authorization is terminated or revoked.  Performed at [facility], [place] [address], [place], [place] [address]   Culture, blood (routine x 2)     Status: None (Preliminary result)   Collection Time: 06/30/21 11:03 AM   Specimen: BLOOD  Result Value Ref Range Status   Specimen Description   Final    BLOOD LEFT ANTECUBITAL Performed at [place] [address], [place], [place] [address]    Special Requests   Final    Blood Culture adequate volume BOTTLES DRAWN AEROBIC AND ANAEROBIC Performed at [place] [address] [place], [place] [address]    Culture   Final    NO GROWTH 2 DAYS Performed at [place] [address], [place], [place] [address]    Report Status PENDING  Incomplete  Culture, blood (routine x 2)     Status: None (Preliminary result)   Collection Time: 06/30/21 11:08 AM   Specimen: BLOOD  Result Value Ref Range Status   Specimen Description   Final    BLOOD BLOOD LEFT HAND Performed at [place] [address] [place], [place] [address]    Special Requests   Final    Blood Culture results may not be optimal due to an inadequate volume of blood received in culture bottles BOTTLES DRAWN AEROBIC AND ANAEROBIC Performed at [facility], [place] [address], [place], [place] [address]    Culture   Final    NO GROWTH 2 DAYS Performed at [place] [address], [place], [place] [address]    Report Status  PENDING  Incomplete  Resp Panel by RT-PCR (Flu A&B, Covid)     Status: None   Collection Time: 06/30/21 12:41 PM   Specimen: Nasopharyngeal(NP) swabs in vial transport medium  Result Value Ref Range Status   SARS Coronavirus 2  by RT PCR NEGATIVE NEGATIVE Final    Comment: (NOTE) SARS-CoV-2 target nucleic acids are NOT DETECTED.  The SARS-CoV-2 RNA is generally detectable in upper respiratory specimens during the acute phase of infection. The lowest concentration of SARS-CoV-2 viral copies this assay can detect is 138 copies/mL. A negative result does not preclude SARS-Cov-2 infection and should not be used as the sole basis for treatment or other patient management decisions. A negative result may occur with  improper specimen collection/handling, submission of specimen other than nasopharyngeal swab, presence of viral mutation(s) within the areas targeted by this assay, and inadequate number of viral copies(<138 copies/mL). A negative result must be combined with clinical observations, patient history, and epidemiological information. The expected result is Negative.  Fact Sheet for Patients:  EntrepreneurPulse.com.au  Fact Sheet for Healthcare Providers:  IncredibleEmployment.be  This test is no t yet approved or cleared by the [place] FDA and  has been authorized for detection and/or diagnosis of SARS-CoV-2 by FDA under an Emergency Use Authorization (EUA). This EUA will remain  in effect (meaning this test can be used) for the duration of the COVID-19 declaration under Section 564(b)(1) of the Act, 21 U.S.C.section 360bbb-3(b)(1), unless the authorization is terminated  or revoked sooner.       Influenza A by PCR NEGATIVE NEGATIVE Final   Influenza B by PCR NEGATIVE NEGATIVE Final    Comment: (NOTE) The Xpert Xpress SARS-CoV-2/FLU/RSV plus assay is intended as an aid in the diagnosis of influenza from Nasopharyngeal swab specimens  and should not be used as a sole basis for treatment. Nasal washings and aspirates are unacceptable for Xpert Xpress SARS-CoV-2/FLU/RSV testing.  Fact Sheet for Patients: EntrepreneurPulse.com.au  Fact Sheet for Healthcare Providers: IncredibleEmployment.be  This test is not yet approved or cleared by the [place] FDA and has been authorized for detection and/or diagnosis of SARS-CoV-2 by FDA under an Emergency Use Authorization (EUA). This EUA will remain in effect (meaning this test can be used) for the duration of the COVID-19 declaration under Section 564(b)(1) of the Act, 21 U.S.C. section 360bbb-3(b)(1), unless the authorization is terminated or revoked.  Performed at [facility], [place] [address], [place], [place] [address]   Culture, body fluid w Gram Stain-bottle     Status: None (Preliminary result)   Collection Time: 06/30/21  5:02 PM   Specimen: Fluid  Result Value Ref Range Status   Specimen Description FLUID PERICARDIAL  Final   Special Requests BOTTLES DRAWN AEROBIC AND ANAEROBIC  Final   Culture   Final    NO GROWTH 2 DAYS Performed at [facility], [place] [address] [place], [place] [address]    Report Status PENDING  Incomplete  Gram stain     Status: None   Collection Time: 06/30/21  5:02 PM   Specimen: Fluid  Result Value Ref Range Status   Specimen Description FLUID PERICARDIAL  Final   Special Requests NONE  Final   Gram Stain   Final    FEW WBC PRESENT, PREDOMINANTLY MONONUCLEAR NO ORGANISMS SEEN Performed at [facility], [place] [address] [place], [place] [address]    Report Status 06/30/2021 FINAL  Final  MRSA Next Gen by PCR, Nasal     Status: None   Collection Time: 06/30/21  5:55 PM   Specimen: Nasal Mucosa; Nasal Swab  Result Value Ref Range Status   MRSA by PCR Next Gen NOT DETECTED NOT DETECTED Final    Comment: (NOTE) The  GeneXpert MRSA Assay (FDA approved for NASAL  specimens only), is one component of a comprehensive MRSA colonization surveillance program. It is not intended to diagnose MRSA infection nor to guide or monitor treatment for MRSA infections. Test performance is not FDA approved in patients less than 11 years old. Performed at [facility], [place] [address], [place], [place] [address]          Radiology Studies: DG Chest Port 1 View  Result Date: 07/02/2021 CLINICAL DATA:  Recurrent left pleural effusion EXAM: PORTABLE CHEST 1 VIEW COMPARISON:  CT chest and chest radiographs dated June 30, 2021 FINDINGS: Near complete opacification of the left hemithorax due to effusion, tumor and lung atelectasis, unchanged. Small area of aerated lungs is also unchanged. Left-sided chest tube is present. No appreciable pneumothorax. Mediastinal shift to the right. Right lung is clear. No acute osseous abnormality. IMPRESSION: No significant interval change. Electronically Signed   By: Keane Police D.O.   On: 07/02/2021 10:00   ECHOCARDIOGRAM LIMITED  Result Date: 07/02/2021    ECHOCARDIOGRAM LIMITED REPORT   Patient Name:   MIGUELINA FORE Date of Exam: 07/02/2021 Medical Rec #:  [id number]          Height:       62.0 in Accession #:    [id number]         Weight:       110.0 lb Date of Birth:  1951/01/07          BSA:          1.483 m Patient Age:    23 years           BP:           101/73 mmHg Patient Gender: F                  HR:           102 bpm. Exam Location:  Inpatient Procedure: Limited Echo Indications:    Pericardial effusion I31.3  History:        Patient has prior history of Echocardiogram examinations, most                 recent 07/01/2021. Risk Factors:Diabetes and Dyslipidemia. Lung                 Cancer.  Sonographer:    Darlina Sicilian RDCS Referring Phys: [id number] [facility] A CHANDRASEKHAR  Conclusion(s)/Recommendation(s): Normal LV function, EF 60-65%. RV normal size and function. Small pericardial effusion along the RV free wall. No  cardiac tamponade. Phineas Inches Electronically signed by Phineas Inches Signature Date/Time: 07/02/2021/9:05:55 AM    Final    ECHOCARDIOGRAM LIMITED  Result Date: 07/01/2021    ECHOCARDIOGRAM LIMITED REPORT   Patient Name:   ANIAH PAULI Date of Exam: 07/01/2021 Medical Rec #:  [id number]          Height:       62.0 in Accession #:    [id number]         Weight:       110.0 lb Date of Birth:  09-Jan-1951          BSA:          1.483 m Patient Age:    65 years           BP:           94/72 mmHg Patient Gender: F  HR:           126 bpm. Exam Location:  Inpatient Procedure: Limited Echo Indications:    Pericardial Effusion  History:        Patient has prior history of Echocardiogram examinations, most                 recent 06/30/2021.  Sonographer:    Jefferey Pica Referring Phys: [id number] OACZYSAY A ARIDA  Sonographer Comments: Unable to obtain images from all windows due to dressings. IMPRESSIONS  1. Limited study to assess pericardial effusion; small pericardial effusion predominantly surrounding right atrium; effusion much smaller compared to 06/30/20.  2. Left ventricular ejection fraction, by estimation, is 50 to 55%. The left ventricle has low normal function. The left ventricle has no regional wall motion abnormalities.  3. Right ventricular systolic function is normal. The right ventricular size is normal.  4. A small pericardial effusion is present.  5. The mitral valve is normal in structure.  6. The aortic valve has an indeterminant number of cusps.  7. The inferior vena cava is normal in size with greater than 50% respiratory variability, suggesting right atrial pressure of 3 mmHg. FINDINGS  Left Ventricle: Left ventricular ejection fraction, by estimation, is 50 to 55%. The left ventricle has low normal function. The left ventricle has no regional wall motion abnormalities. The left ventricular internal cavity size was normal in size. Right Ventricle: The right ventricular size is normal.  Right ventricular systolic function is normal. Left Atrium: Left atrial size was normal in size. Right Atrium: Right atrial size was normal in size. Pericardium: A small pericardial effusion is present. Mitral Valve: The mitral valve is normal in structure. Tricuspid Valve: The tricuspid valve is normal in structure. Aortic Valve: The aortic valve has an indeterminant number of cusps. Venous: The inferior vena cava is normal in size with greater than 50% respiratory variability, suggesting right atrial pressure of 3 mmHg. Additional Comments: Limited study to assess pericardial effusion; small pericardial effusion predominantly surrounding right atrium; effusion much smaller compared to 06/30/20. LEFT VENTRICLE PLAX 2D LVIDd:         4.20 cm LVIDs:         3.00 cm LV PW:         1.10 cm LV IVS:        1.00 cm LVOT diam:     2.00 cm LVOT Area:     3.14 cm  IVC IVC diam: 2.00 cm LEFT ATRIUM         Index LA diam:    3.20 cm 2.16 cm/m   AORTA Ao Root diam: 3.50 cm Ao Asc diam:  3.50 cm  SHUNTS Systemic Diam: 2.00 cm Kirk Ruths MD Electronically signed by Kirk Ruths MD Signature Date/Time: 07/01/2021/11:59:27 AM    Final         Scheduled Meds:  amiodarone  200 mg Oral Daily   Chlorhexidine Gluconate Cloth  6 each Topical Daily   dexamethasone  4 mg Oral Q12H   docusate sodium  100 mg Oral BID   enoxaparin (LOVENOX) injection  30 mg Subcutaneous Q24H   [START ON 07/03/2021] feeding supplement  237 mL Oral BID BM   gabapentin  300 mg Oral QHS   insulin aspart  0-9 Units Subcutaneous TID WC   lacosamide  50 mg Oral BID   letrozole  2.5 mg Oral Daily   multivitamin with minerals  1 tablet Oral Daily   polyethylene glycol  17 g  Oral Daily   sodium chloride flush  10 mL Other Q8H   thyroid  60 mg Oral Daily   zinc sulfate  220 mg Oral Daily   Continuous Infusions:   LOS: 2 days   Time spent: 83mins Greater than 50% of this time was spent in counseling, explanation of diagnosis, planning of  further management, and coordination of care.   Voice Recognition Viviann Spare dictation system was used to create this note, attempts have been made to correct errors. Please contact the author with questions and/or clarifications.   Florencia Reasons, MD PhD FACP [facility]  Available via Epic secure chat 7am-7pm for nonurgent issues Please page for urgent issues To page the attending provider between 7A-7P or the covering provider during after hours 7P-7A, please log into the web site [email or web address] and access using universal [place] password for that web site. If you do not have the password, please call the hospital operator.    07/02/2021, 6:59 PM

## 2021-07-02 NOTE — Care Management (Signed)
°  Transition of Care [facility]) Screening Note   Patient Details  Name: Tami Stone Date of Birth: 1951/02/01   Transition of [facility]) CM/SW Contact:    Bethena Roys, RN Phone Number: 07/02/2021, 2:22 PM    Transition of Care Department [facility]) has reviewed patient and no TOC needs have been identified at this time. We will continue to monitor patient advancement through interdisciplinary progression rounds. If new patient transition needs arise, please place a TOC consult.

## 2021-07-02 NOTE — Consult Note (Signed)
Consultation Note Date: 07/02/2021   Patient Name: Tami Stone  DOB: [birth year]  MRN: [id number]  Age / Sex: 71 y.o., female  PCP: Gaynelle Cage, MD Referring Physician: Wellington Hampshire, MD  Reason for Consultation:   HPI/Patient Profile: 71 y.o. 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 Workup reveals malignant pericardial and pleural effusion. Pericardial drain placed and has been dc'd. Continues with chest tube. Palliative medicine consulted for [place].   Primary Decision Maker PATIENT  Discussion: I have reviewed medical records including EPIC notes, labs and imaging, received report from Dr. Erin Fulling and Melina Copa, NP, assessed the patient and then met with Darby and husband Patrick Jupiter with two of her children- Roselyn Reef and Erline Levine  to discuss diagnosis prognosis, GOC, EOL wishes, disposition and options.  I introduced Palliative Medicine as specialized medical care for people living with serious illness. It focuses on providing relief from the symptoms and stress of a serious illness. The goal is to improve quality of life for both the patient and the family.  Rayona is very focused on her family. She cares for her daughter who has been disabled since she was 69 and is now 54. Her main worries are how her illness is affecting her family emotionally and by causing increased burdens.   We discussed patient's current illness and what it means in the larger context of patient's on-going co-morbidities.  Natural disease trajectory and expectations at EOL were discussed. Ambera is aware that her illness is likely terminal- she does not wish to proceed with further  interventions such as chemotherapy, radiation, or repeat hospitalizations. She does want to start holistic treatments with [facility] which she has ordered from [employer].   Her goals are to remain at home, as functional as possible for as long as possible, however, she would not want to return to the hospital if she declines (which we discussed she likely would, due to the nature of malignant effusions which tend to recur quickly after they are initially relieved with interventions) - rather she would want to remain in her home with comfort interventions.  Hospice and Palliative Care services outpatient were explained and offered. She is in agreement with Hospice philosophy and services.   Questions and concerns were addressed. The family was encouraged to call with questions or concerns.     SUMMARY OF RECOMMENDATIONS Stage IV lung cancer metastatic to bone and brain, with malignant pericardial and pleural effusions- plan for discharge home with Hospice Symptom management-  -Hydromorphone liquid 55m q4hr prn for pain or SOB  -On discharge, would recommend scripts for: - Hydromorphone liquid 185mml: 52m34m52ml51mo every 2 hours as needed for pain or shortness of breath: Disp 120 ml - Lorazepam 52mg/51mconcentrated solution: 1mg (7mml) s47mingual every 4 hours as needed for anxiety: Disp 30ml - 66mol 52mg/ml10m  solution: 0.26m (0.283m sublingual every 4 hours as needed for agitation or nausea: Disp 3045m-TOC order entered for referral to Hospice  Code Status/Advance Care Planning: DNR   Prognosis:   < 3 months  Discharge Planning: Home with Hospice  Primary Diagnoses: Present on Admission:  Cardiac tamponade  Pericardial effusion     Physical Exam Vitals and nursing note reviewed.  Constitutional:      Comments: frail  Pulmonary:     Effort: Pulmonary effort is normal.  Neurological:     General: No focal deficit present.     Mental Status: She is alert.    Vital Signs: BP 120/85     Pulse (!) 102    Temp 98.1 F (36.7 C) (Oral)    Resp (!) 30    Ht 5' 2"  (1.575 m)    Wt 49.9 kg    SpO2 98%    BMI 20.12 kg/m  Pain Scale: 0-10 POSS *See Group Information*: 1-Acceptable,Awake and alert Pain Score: 2    SpO2: SpO2: 98 % O2 Device:SpO2: 98 % O2 Flow Rate: .O2 Flow Rate (L/min): 2 L/min  IO: Intake/output summary:  Intake/Output Summary (Last 24 hours) at 07/02/2021 1157 Last data filed at 07/02/2021 1130 Gross per 24 hour  Intake 30 ml  Output 735 ml  Net -705 ml    LBM: Last BM Date: 07/01/21 Baseline Weight: Weight: 49.9 kg Most recent weight: Weight: 49.9 kg        Thank you for this consult. Palliative medicine will continue to follow and assist as needed.   Signed by: KasMariana KaufmanGNP-C Palliative Medicine    Please contact Palliative Medicine Team phone at [phone] questions and concerns.  For individual provider: See AmiShea Evans

## 2021-07-02 NOTE — Progress Notes (Signed)
Tami Stone, MRN:  [id number], DOB:  1951-02-25, LOS: 2 ADMISSION DATE:  06/30/2021 CONSULTATION DATE:  07/01/2021 REFERRING MD:  Fletcher Anon - TRH CHIEF COMPLAINT:  Malignant pleural effusion, PleurX catheter dysfunction   History of Present Illness:  71 year old woman who presented to [facility] 1/18 for SOB, found to have pericardial effusion with cardiac tamponade. 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 Recent admission 1/11 - 1/14 for worsening vasogenic edema in the setting of brain metastasis.  Presented to [facility] 1/18 for worsening SOB after she noted that her PleurX catheter was not draining well. Denies CP/pressure or tightness on admission. Endorses palpitations. Initial concern for PE in the setting of malignancy; CTA Chest was obtained and demonstrated worsened L pleural effusion and pericardial effusion with RV collapse concern for cardiac tamponade. Cardiology was consulted. Patient underwent emergently pericardiocentesis 1/18.  PCCM consulted 1/19 for ongoing malignant pleural effusion/PleurX catheter dysfunction.  Pertinent Medical History:   Past Medical History:  Diagnosis Date   Diabetes ([place])    Does not check bs at home   Dyspnea    High cholesterol    Lung cancer ([place])    04/2020   Significant Hospital Events: Including procedures, antibiotic start and stop dates in addition to other pertinent events   1/18 - Presented to [facility] for SOB. CTA Chest negative for PE, showed worsening pleural effusion and pericardial effusion with RV collapse/concern for cardiac tamponade. S/p pericardiocentesis 1/18. 1/19 - PCCM consulted for PleurX catheter dysfunction in the setting of malignant pleural effusion. tPA/dornase administered. 1/20 - PleurX with 85mL drained since lytic instillation. No significant improvement in  drainage. CXR with persistent complete opacification of L hemithorax.  Interim History / Subjective:  Feels comfortable this morning No significant complaints Baseline SOB PleurX with 54mL output Pericardial drain with 22mL output  Objective:  Blood pressure 114/75, pulse 96, temperature 98.4 F (36.9 C), temperature source Axillary, resp. rate 19, height 5\' 2"  (1.575 m), weight 49.9 kg, SpO2 96 %.        Intake/Output Summary (Last 24 hours) at 07/02/2021 0807 Last data filed at 07/02/2021 0500 Gross per 24 hour  Intake 10 ml  Output 610 ml  Net -600 ml    Filed Weights   06/30/21 1047  Weight: 49.9 kg   Physical Examination: General: Chronically ill-appearing woman in NAD. Cachectic. HEENT: [place]/AT, anicteric sclera, PERRL, moist mucous membranes. Neuro: Awake, oriented x 4. Responds to verbal stimuli. Following commands consistently. Moves all 4 extremities spontaneously. CV: Mildly tachycardic, regular rhythm, no m/g/r. PULM: Breathing even and unlabored on 2LNC. Lung fields diminished bilaterally, L significantly > R. PleurX cathter to lateral L chest with scant serosanguineous output. GI: Soft, nontender, nondistended. Normoactive bowel sounds. Extremities: No LE edema noted. Skin: Warm/dry, no rashes.  Resolved Hospital Problem List:    Assessment & Plan:  [name] is seen in consultation at the request of [place] for further evaluation and management of malignant effusion and PleurX catheter dysfunction.  Malignant pleural effusion Malfunctioning PleurX catheter CCM consulted for PleurX catheter dysfunction. Initially placed 05/28/2021 by Dr. Valeta Harms for loculated malignant L pleural effusion. S/p thoracentesis. Path c/w adenocarcinoma. - PleurX drainage system converted to [place] with adapter - S/p tPA/dornase x 1 1/19 without significant improvement in tube drainage/output (21mL) - CXR grossly unchanged post-lytics - CT Chest from admission reviewed, tube  appears to be  inferior to any drainable effusion, appears primarily surrounded by tumor. Loculated effusion in LUL possibly accessible, though intervening upon this may be futile and would likely result in fluid reaccumulation and more discomfort. Will defer additional intervention at this time. - Allow PleurX to drain with -20mmHg suction x additional 24H - Likely cap 1/21 with plan for intermittent drainage at home vs. tube removal  Cardiac tamponade Malignant pericardial effusion Presented 1/18 with SOB. CTA Chest negative for PE, but demonstrated worsening pleural effusion/pericardial effusion. RV collapse noted with c/f tamponade. - S/p pericardiocentesis 1/18 - Hemovac remains in place, 13mL output/24H  NSCLC, adenocarcinoma with metastasis Stage 3 IDC of L breast Brain metastases c/b vasogenic edema On Femara, Decadron, Vimpat - Continue Decadron, Vimpat - Ongoing discussion with patient/oncology re: chemotherapy - PMT  - Continue Decadron, Vimpat - Per Oncology note last admission, patient is not interested in chemo/immunotherapy - Undecided about brain XRT - PMT consulted this admission, appreciate assistance  Thank you for this consult. PCCM will continue to follow with you.  Best Practice: (right click and "Reselect all SmartList Selections" daily)   Per Primary Team  Critical care time: N/A   Rhae Lerner [place] [facility] 07/02/21 8:07 AM  Please see [email or web address] for pager details.  From 7A-7P if no response, please call [phone] After hours, please call ELink [phone]

## 2021-07-02 NOTE — Progress Notes (Signed)
°  Echocardiogram 2D Echocardiogram limited has been performed.  Tami Stone M 07/02/2021, 8:19 AM

## 2021-07-02 NOTE — Progress Notes (Signed)
Received update from palliative care team that patient would ultimately like to go home with hospice. Per d/w CM, Hassan Rowan met with the patient who wants to process this information and would like for [facility] team to contact her while she is in the hospital tomorrow or Sunday discuss plans and equipment. Once this is arranged she will likely be discharged. I have signed out to the weekend APP to be aware and Hassan Rowan will also touch base with Nicki Reaper tomorrow with update on status.

## 2021-07-02 NOTE — TOC Initial Note (Signed)
Transition of Care [facility]) - Initial/Assessment Note    Patient Details  Name: Tami Stone MRN: [id number] Date of Birth: [birth year]  Transition of Care [facility]) CM/SW Contact:    Bethena Roys, RN Phone Number: 07/02/2021, 3:32 PM  Clinical Narrative:  Case Manager received a consult to offer Hospice choice at home. Prior to arrival patient was from home with spouse. Case Manager asked if spouse needs to be notified and she states she would like to make him aware. Patient wanted to make the agency choice. Medicare .gov list reviewed and patient chose [facility] and [facility]) on the list. Case Manager made the referral to [facility]. Patient asked if [facility] could call her on Saturday, she wanted to have some time to process the plan of care- Liaison is aware. Case Manager discussed durable medical equipment (DME) needs with the patient and she will benefit from a bedside commode and wheelchair at home. Referral provided to [facility]. Patient states she will like to travel home via private vehicle. Unsure of discharge date a this time. Case Manager will continue to follow for additional transition of care needs.       Expected Discharge Plan: Home w Hospice Care Barriers to Discharge: Continued Medical Work up   Patient Goals and CMS Choice Patient states their goals for this hospitalization and ongoing recovery are:: to return home with Hospice Services. CMS Medicare.gov Compare Post Acute Care list provided to:: Patient Choice offered to / list presented to : Patient  Expected Discharge Plan and Services Expected Discharge Plan: Home w Hospice Care In-house Referral: Hospice / Palliative Care Discharge Planning Services: CM Consult Post Acute Care Choice: Hospice Living arrangements for the past 2 months: Single Family Home                 DME Arranged: Bedside commode, Lightweight manual wheelchair with seat  cushion DME Agency: [facility]ospice and [place] (Hospice to order equipment.) Date DME Agency Contacted: 07/02/21 Time DME Agency Contacted: [id number] Representative spoke with at DME Agency: [place]: RN [facility] Agency: Hospice and [place] ([place]) Date [place]: 07/02/21 Time [place]: [place] Representative spoke with at [place]: [place]  Prior Living Arrangements/Services Living arrangements for the past 2 months: [place] Lives with:: Adult Children, Spouse Patient language and need for interpreter reviewed:: Yes Do you feel safe going back to the place where you live?: Yes      Need for Family Participation in Patient Care: Yes (Comment) Care giver support system in place?: Yes (comment)   Criminal Activity/Legal Involvement Pertinent to Current Situation/Hospitalization: No - Comment as needed  Activities of Daily Living      Permission Sought/Granted Permission sought to share information with : Family Supports, Customer service manager, Case Optician, dispensing granted to share information with : Yes, Verbal Permission Granted     Permission granted to share info w AGENCY: Cabin crew        Emotional Assessment Appearance:: Appears stated age Attitude/Demeanor/Rapport: Engaged Affect (typically observed): Accepting Orientation: : Oriented to Self, Oriented to Place, Oriented to  Time, Oriented to Situation Alcohol / Substance Use: Not Applicable Psych Involvement: No (comment)  Admission diagnosis:  Pericardial effusion [I31.39] Recurrent left pleural effusion [J90] Cardiac tamponade [I31.4] Patient Active Problem List   Diagnosis Date Noted   Cardiac tamponade 06/30/2021   Pericardial effusion 06/30/2021   Vasogenic edema ([place]) 06/23/2021  Malignant pleural effusion 05/12/2021   Chest tube in place    Syncope 01/28/2021   Protein-calorie malnutrition,  severe 01/20/2021   Hydropneumothorax 01/20/2021   Pleural effusion on left 01/18/2021   Malignant neoplasm of lower-outer quadrant of left breast of female, estrogen receptor positive ([place]) 07/03/2020   Goals of care, counseling/discussion 06/02/2020   Adenocarcinoma of left lung, stage 4 ([place]) 05/16/2020   Encounter for antineoplastic chemotherapy 05/16/2020   Mass of left breast 05/14/2020   Mass of lower lobe of left lung 04/28/2020   PCP:  Gaynelle Cage, MD Pharmacy:   [place], [address] [place] [place] [address] Phone: [phone] Fax: [phone]  Readmission Risk Interventions No flowsheet data found.

## 2021-07-03 DIAGNOSIS — I3139 Other pericardial effusion (noninflammatory): Secondary | ICD-10-CM

## 2021-07-03 DIAGNOSIS — I471 Supraventricular tachycardia: Secondary | ICD-10-CM

## 2021-07-03 DIAGNOSIS — J90 Pleural effusion, not elsewhere classified: Secondary | ICD-10-CM

## 2021-07-03 DIAGNOSIS — Z515 Encounter for palliative care: Secondary | ICD-10-CM

## 2021-07-03 DIAGNOSIS — I314 Cardiac tamponade: Secondary | ICD-10-CM | POA: Diagnosis not present

## 2021-07-03 LAB — BASIC METABOLIC PANEL WITH GFR
Anion gap: 8 (ref 5–15)
BUN: 17 mg/dL (ref 8–23)
CO2: 27 mmol/L (ref 22–32)
Calcium: 8.5 mg/dL — ABNORMAL LOW (ref 8.9–10.3)
Chloride: 98 mmol/L (ref 98–111)
Creatinine, Ser: 0.55 mg/dL (ref 0.44–1.00)
GFR, Estimated: >60 mL/min
Glucose, Bld: 147 mg/dL — ABNORMAL HIGH (ref 70–99)
Potassium: 5.4 mmol/L — ABNORMAL HIGH (ref 3.5–5.1)
Sodium: 133 mmol/L — ABNORMAL LOW (ref 135–145)

## 2021-07-03 LAB — GLUCOSE, CAPILLARY
Glucose-Capillary: 126 mg/dL — ABNORMAL HIGH (ref 70–99)
Glucose-Capillary: 115 mg/dL — ABNORMAL HIGH (ref 70–99)
Glucose-Capillary: 226 mg/dL — ABNORMAL HIGH (ref 70–99)
Glucose-Capillary: 112 mg/dL — ABNORMAL HIGH (ref 70–99)

## 2021-07-03 LAB — CBC
HCT: 33.8 % — ABNORMAL LOW (ref 36.0–46.0)
Hemoglobin: 10.3 g/dL — ABNORMAL LOW (ref 12.0–15.0)
MCH: 26.5 pg (ref 26.0–34.0)
MCHC: 30.5 g/dL (ref 30.0–36.0)
MCV: 87.1 fL (ref 80.0–100.0)
Platelets: 581 K/uL — ABNORMAL HIGH (ref 150–400)
RBC: 3.88 MIL/uL (ref 3.87–5.11)
RDW: 18.1 % — ABNORMAL HIGH (ref 11.5–15.5)
WBC: 15.4 K/uL — ABNORMAL HIGH (ref 4.0–10.5)
nRBC: 0 % (ref 0.0–0.2)

## 2021-07-03 MED ORDER — ORAL CARE MOUTH RINSE
15.0000 mL | Freq: Two times a day (BID) | OROMUCOSAL | Status: DC
  Administered 2021-07-04 – 2021-07-07 (×7): 15 mL via OROMUCOSAL

## 2021-07-03 MED ORDER — ENSURE ENLIVE PO LIQD
237.0000 mL | Freq: Three times a day (TID) | ORAL | Status: DC
  Administered 2021-07-03 – 2021-07-07 (×13): 237 mL via ORAL

## 2021-07-03 NOTE — Progress Notes (Signed)
Palliative Medicine Inpatient Follow Up Note  HPI:  71 y.o. 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 Workup reveals malignant pericardial and pleural effusion. Pericardial drain placed and has been dc'd. Continues with chest tube. Palliative medicine consulted for [place].   Today's Discussion (07/03/2021):   *Please note that this is a verbal dictation therefore any spelling or grammatical errors are due to the "[place] One" system interpretation.  Chart reviewed inclusive of progress notes, laboratory results, and diagnostic images.   Created space and opportunity for patient to explore thoughts feelings and fears regarding current medical situation.  I met with Heath and her son and daughter in law at bedside.We discussed that initially she had thought that she would go home with hospice though she has now reconsidered that. She shares that feeling like her heart is "squeezed" is very uncomfortable. She would like to if need be pursue pericardial drainage in the future. She shares that she did not know this would be an option which is why she selected hospice.  I shared that if and when a procedure should be needed it's up to the cardiac surgeon to determine if she would or would not be a strong candidate. Justus shares with me that "either way I am going to die" and would like this option to be on the table.  I expresses that our job is to advocate for what patients want but to make sure that they are well educated in terms of these choices.   Reviewed the differences between Palliative care and hospice care in the home.   Provided additional information via handouts.   Confirmed DNAR/DNI.   Plan to optimize and ideally go home with  OP Palliative support.   Questions and concerns addressed   Patient is amenable to be calling and speaking to her husband to provide an update.  Objective Assessment: Vital Signs Vitals:   07/03/21 1100 07/03/21 1200  BP: 138/83 124/88  Pulse: (!) 103 (!) 103  Resp: (!) 37 (!) 31  Temp:    SpO2: 96% 92%    Intake/Output Summary (Last 24 hours) at 07/03/2021 1256 Last data filed at 07/03/2021 1200 Gross per 24 hour  Intake 150 ml  Output 200 ml  Net -50 ml   Last Weight  Most recent update: 07/03/2021  9:39 AM    Weight  48.1 kg (106 lb 0.7 oz)            Gen:  Older Caucasian G HEENT: moist mucous membranes CV: Irregular rate and rhythm  PULM: ON RA, breathing is even and nonlabored ABD: soft/nontender EXT: No edema Neuro: Alert and oriented x3  SUMMARY OF RECOMMENDATIONS   DNAR/DNI  Patient expresses not feeling ready for hospice after consideration  Plan for OP Palliative support and if her condition should worsen to then consider hospice - [facility] is aware  Ongoing incremental Palliative care support  On discharge can continue ativan and dilaudid orally as needed  MDM - High  Medical Decision Making: 4 #/Complex Problems: 4                     Data Reviewed:    4             Management: 4 (1-Straightforward, 2-Low,  3-Moderate, 4-High) ______________________________________________________________________________________ [place] Team Team Cell Phone: [phone] Please utilize secure chat with additional questions, if there is no response within 30 minutes please call the above phone number  Palliative Medicine Team providers are available by phone from 7am to 7pm daily and can be reached through the team cell phone.  Should this patient require assistance outside of these hours, please call the patient's attending physician.

## 2021-07-03 NOTE — Progress Notes (Signed)
Hydrologist [facility])  Hospital Liaison: RN note       Notified by [facility] manager of patient/family request for [facility] Palliative services at home after discharge.         [place] Palliative team will follow up with patient after discharge.       Please call with any hospice or palliative related questions.       Thank you for this referral.       Clementeen Hoof, BSN, RN [place] (listed on [place] under Hospice and [place] of [phone]  [phone]

## 2021-07-03 NOTE — Progress Notes (Signed)
Medicine Consult PROGRESS NOTE    Tami Stone  IDP:[id number] DOB: 1950-07-22 DOA: 06/30/2021 PCP: Gaynelle Cage, MD    Chief Complaint  Patient presents with   Shortness of Breath    Brief Narrative:   Tami Stone is a 71 y.o. 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 femara , h/o  Metastatic lung CA, she is sent to ED due to feeling sob, hypoxia, found to have large pericardial effusion and cardiac tamponade,  she is urgently admitted to cardiology service for pericardiocentesis and pericardiodrain placement, hospitalist consulted for medical management    Subjective:  She is seen with husband at her bedside She denies acute needs  she is on room air   Assessment & Plan:   Principal Problem:   Cardiac tamponade Active Problems:   Recurrent left pleural effusion   Pericardial effusion   Atrial tachycardia (HCC)   Pericardial effusion -Management per cardiology   Pleural effusion/ Pleurx malfunction  pccm consulted      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  - Oncology consulted for last admission but patient is not interested in chemotherapy or immunotherapy.  - Radiation oncology consulted from last admission who recommended whole brain XRT but the patient is undecided about radiation therapy either.      Dysphagia due to extrinsic compression from cancer Discussed with her regarding liquid /soft diet She would like to have regular diet so she could have more choices , regular diet ordered per patient's preference    Stage III breast cancer On femara     Noninsulin-dependent type 2 diabetes , controlled  A1c 5.4%.  On metformin at home. 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  Also has poor oral intake    Leukocytosis: Likely reactive, also has been on steroid, does not appear to have infection   Thrombocytosis : likely reactive -Continue monitoring.     -Normocytic anemia  no evidence of acute blood loss -Suspect secondary to underlying malignancy -Received 2 unit PRBC during recent admission     Overall very poor prognosis, palliative care consulted , she is not ready for hospice, agree with being DNR and outpatient palliative care   Body mass index is 19.4 kg/m..  .   Unresulted Labs (From admission, onward)     Start     Ordered   07/07/21 0500  Creatinine, serum  (enoxaparin (LOVENOX)    CrCl < 30 ml/min)  Weekly,   R     Comments: while on enoxaparin therapy.    06/30/21 1748   07/02/21 0500  CBC  Daily,   R     Question:  Specimen collection method  Answer:  Lab=Lab collect   07/01/21 0854   07/02/21 4431  Basic metabolic panel  Daily,   R     Question:  Specimen collection method  Answer:  Lab=Lab collect   07/01/21 0854              DVT prophylaxis: enoxaparin (LOVENOX) injection 30 mg Start: 07/02/21 1000   Code Status:DNR Family Communication: family at bedside on 1/18, 1/19, 1/21 Disposition:   Status is: Inpatient  Dispo: The  patient is from: home               Anticipated d/c is to: likely home with outpatient palliative care              Anticipated d/c date is: needs cardiology clearance , sometime next week                Consultants:  Cardiology is primary Hospitalist as consultant Palliative care Pulmonary/critical care    Objective: Vitals:   07/03/21 1100 07/03/21 1200 07/03/21 1300 07/03/21 1400  BP: 138/83 124/88 95/73 119/80  Pulse: (!) 103 (!) 103 98 98  Resp: (!) 37 (!) 31 (!) 24 (!) 28  Temp:  98.4 F (36.9 C)    TempSrc:  Oral    SpO2: 96% 92% 92% 95%  Weight:      Height:         Intake/Output Summary (Last 24 hours) at 07/03/2021 1459 Last data filed at 07/03/2021 1200 Gross per 24 hour  Intake 150 ml  Output 200 ml  Net -50 ml   Filed Weights   06/30/21 1047 07/03/21 0938  Weight: 49.9 kg 48.1 kg    Examination:  General exam: thin, frail, alert, awake, communicative Respiratory system: Diminished at left side, slightly tachypneic, no accessory muscle use, left-sided chest tube connected during.  Cardiovascular system: less Sinus tachycardia Gastrointestinal system: Abdomen is nondistended, soft and nontender.  Normal bowel sounds heard. Central nervous system: Alert and oriented. No focal neurological deficits. Extremities:  no edema Skin: No rashes, lesions or ulcers Psychiatry: Judgement and insight appear normal. Mood & affect appropriate.     Data Reviewed: I have personally reviewed following labs and imaging studies  CBC: Recent Labs  Lab 06/30/21 1103 07/01/21 0050 07/02/21 0107 07/03/21 0323  WBC 16.6* 17.5* 16.8* 15.4*  NEUTROABS 14.1*  --   --   --   HGB 10.9* 11.4* 10.9* 10.3*  HCT 34.9* 34.1* 35.0* 33.8*  MCV 86.8 83.8 86.2 87.1  PLT 686* 600* 596* 581*    Basic Metabolic Panel: Recent Labs  Lab 06/30/21 1103 07/01/21 0050 07/02/21 0107 07/02/21 0254 07/03/21 0323  NA 129* 127* 133* 132* 133*  K 4.3 3.6 5.6* 4.7 5.4*  CL 92* 94* 99 98 98  CO2 26 25 28 25 27   GLUCOSE 116* 150* 141* 190* 147*  BUN 31* 23 16 18 17   CREATININE 0.78 0.61 0.55 0.50 0.55  CALCIUM 8.4* 8.0* 8.8* 8.7* 8.5*    GFR: Estimated Creatinine Clearance: 49 mL/min (by C-G formula based on SCr of 0.55 mg/dL).  Liver Function Tests: No results for input(s): AST, ALT, ALKPHOS, BILITOT, PROT, ALBUMIN in the last 168 hours.   CBG: Recent Labs  Lab 07/02/21 1122 07/02/21 1601 07/02/21 2108 07/03/21 0636 07/03/21 1136  GLUCAP 115* 114* 146* 126* 115*     Recent Results (from the past 240 hour(s))  Resp Panel by RT-PCR (Flu A&B, Covid)  Nasopharyngeal Swab     Status: None   Collection Time: 06/23/21  4:20 PM   Specimen: Nasopharyngeal Swab; Nasopharyngeal(NP) swabs in vial transport medium  Result Value Ref Range Status   SARS Coronavirus 2 by RT PCR NEGATIVE NEGATIVE Final    Comment: (NOTE) SARS-CoV-2 target nucleic acids are NOT DETECTED.  The SARS-CoV-2 RNA is generally detectable in upper respiratory specimens during the acute phase of infection. The lowest concentration of SARS-CoV-2 viral copies this assay can detect is 138 copies/mL. A negative result does not preclude  SARS-Cov-2 infection and should not be used as the sole basis for treatment or other patient management decisions. A negative result may occur with  improper specimen collection/handling, submission of specimen other than nasopharyngeal swab, presence of viral mutation(s) within the areas targeted by this assay, and inadequate number of viral copies(<138 copies/mL). A negative result must be combined with clinical observations, patient history, and epidemiological information. The expected result is Negative.  Fact Sheet for Patients:  EntrepreneurPulse.com.au  Fact Sheet for Healthcare Providers:  IncredibleEmployment.be  This test is no t yet approved or cleared by the [place] FDA and  has been authorized for detection and/or diagnosis of SARS-CoV-2 by FDA under an Emergency Use Authorization (EUA). This EUA will remain  in effect (meaning this test can be used) for the duration of the COVID-19 declaration under Section 564(b)(1) of the Act, 21 U.S.C.section 360bbb-3(b)(1), unless the authorization is terminated  or revoked sooner.       Influenza A by PCR NEGATIVE NEGATIVE Final   Influenza B by PCR NEGATIVE NEGATIVE Final    Comment: (NOTE) The Xpert Xpress SARS-CoV-2/FLU/RSV plus assay is intended as an aid in the diagnosis of influenza from Nasopharyngeal swab specimens and should not be  used as a sole basis for treatment. Nasal washings and aspirates are unacceptable for Xpert Xpress SARS-CoV-2/FLU/RSV testing.  Fact Sheet for Patients: EntrepreneurPulse.com.au  Fact Sheet for Healthcare Providers: IncredibleEmployment.be  This test is not yet approved or cleared by the [place] FDA and has been authorized for detection and/or diagnosis of SARS-CoV-2 by FDA under an Emergency Use Authorization (EUA). This EUA will remain in effect (meaning this test can be used) for the duration of the COVID-19 declaration under Section 564(b)(1) of the Act, 21 U.S.C. section 360bbb-3(b)(1), unless the authorization is terminated or revoked.  Performed at [facility], [place] [address] [place], [place] [address]   Culture, blood (routine x 2)     Status: None (Preliminary result)   Collection Time: 06/30/21 11:03 AM   Specimen: BLOOD  Result Value Ref Range Status   Specimen Description   Final    BLOOD LEFT ANTECUBITAL Performed at [facility] [address] [place], [place] [address]    Special Requests   Final    Blood Culture adequate volume BOTTLES DRAWN AEROBIC AND ANAEROBIC Performed at [facility] [address] [place], [place] [address]    Culture   Final    NO GROWTH 3 DAYS Performed at [facility], [place] [address] [place], [place] [address]    Report Status PENDING  Incomplete  Culture, blood (routine x 2)     Status: None (Preliminary result)   Collection Time: 06/30/21 11:08 AM   Specimen: BLOOD  Result Value Ref Range Status   Specimen Description   Final    BLOOD BLOOD LEFT HAND Performed at [facility] [address] [place], [place] [address]    Special Requests   Final    Blood Culture results may not be optimal due to an inadequate volume of blood received in culture bottles BOTTLES DRAWN AEROBIC AND ANAEROBIC Performed  at [facility], [place] [address], [place], [place] [address]    Culture   Final    NO GROWTH 3 DAYS Performed at [facility], [place] [address] [place], [place] [address]    Report Status PENDING  Incomplete  Resp Panel by RT-PCR (Flu A&B, Covid)  Status: None   Collection Time: 06/30/21 12:41 PM   Specimen: Nasopharyngeal(NP) swabs in vial transport medium  Result Value Ref Range Status   SARS Coronavirus 2 by RT PCR NEGATIVE NEGATIVE Final    Comment: (NOTE) SARS-CoV-2 target nucleic acids are NOT DETECTED.  The SARS-CoV-2 RNA is generally detectable in upper respiratory specimens during the acute phase of infection. The lowest concentration of SARS-CoV-2 viral copies this assay can detect is 138 copies/mL. A negative result does not preclude SARS-Cov-2 infection and should not be used as the sole basis for treatment or other patient management decisions. A negative result may occur with  improper specimen collection/handling, submission of specimen other than nasopharyngeal swab, presence of viral mutation(s) within the areas targeted by this assay, and inadequate number of viral copies(<138 copies/mL). A negative result must be combined with clinical observations, patient history, and epidemiological information. The expected result is Negative.  Fact Sheet for Patients:  EntrepreneurPulse.com.au  Fact Sheet for Healthcare Providers:  IncredibleEmployment.be  This test is no t yet approved or cleared by the [place] FDA and  has been authorized for detection and/or diagnosis of SARS-CoV-2 by FDA under an Emergency Use Authorization (EUA). This EUA will remain  in effect (meaning this test can be used) for the duration of the COVID-19 declaration under Section 564(b)(1) of the Act, 21 U.S.C.section 360bbb-3(b)(1), unless the authorization is terminated  or revoked sooner.       Influenza A by PCR NEGATIVE  NEGATIVE Final   Influenza B by PCR NEGATIVE NEGATIVE Final    Comment: (NOTE) The Xpert Xpress SARS-CoV-2/FLU/RSV plus assay is intended as an aid in the diagnosis of influenza from Nasopharyngeal swab specimens and should not be used as a sole basis for treatment. Nasal washings and aspirates are unacceptable for Xpert Xpress SARS-CoV-2/FLU/RSV testing.  Fact Sheet for Patients: EntrepreneurPulse.com.au  Fact Sheet for Healthcare Providers: IncredibleEmployment.be  This test is not yet approved or cleared by the [place] FDA and has been authorized for detection and/or diagnosis of SARS-CoV-2 by FDA under an Emergency Use Authorization (EUA). This EUA will remain in effect (meaning this test can be used) for the duration of the COVID-19 declaration under Section 564(b)(1) of the Act, 21 U.S.C. section 360bbb-3(b)(1), unless the authorization is terminated or revoked.  Performed at [facility], [place] [address], [place], [place] [address]   Culture, body fluid w Gram Stain-bottle     Status: None (Preliminary result)   Collection Time: 06/30/21  5:02 PM   Specimen: Fluid  Result Value Ref Range Status   Specimen Description FLUID PERICARDIAL  Final   Special Requests BOTTLES DRAWN AEROBIC AND ANAEROBIC  Final   Culture   Final    NO GROWTH 3 DAYS Performed at [facility], [place] [address], [place], [place] [address]    Report Status PENDING  Incomplete  Gram stain     Status: None   Collection Time: 06/30/21  5:02 PM   Specimen: Fluid  Result Value Ref Range Status   Specimen Description FLUID PERICARDIAL  Final   Special Requests NONE  Final   Gram Stain   Final    FEW WBC PRESENT, PREDOMINANTLY MONONUCLEAR NO ORGANISMS SEEN Performed at [facility], [place] [address] [place], [place] [address]    Report Status 06/30/2021 FINAL  Final  MRSA Next Gen by PCR, Nasal     Status: None   Collection Time:  06/30/21  5:55 PM  Specimen: Nasal Mucosa; Nasal Swab  Result Value Ref Range Status   MRSA by PCR Next Gen NOT DETECTED NOT DETECTED Final    Comment: (NOTE) The GeneXpert MRSA Assay (FDA approved for NASAL specimens only), is one component of a comprehensive MRSA colonization surveillance program. It is not intended to diagnose MRSA infection nor to guide or monitor treatment for MRSA infections. Test performance is not FDA approved in patients less than 45 years old. Performed at [facility], [place] [address] [place], [place] [address]          Radiology Studies: DG Chest Port 1 View  Result Date: 07/02/2021 CLINICAL DATA:  Recurrent left pleural effusion EXAM: PORTABLE CHEST 1 VIEW COMPARISON:  CT chest and chest radiographs dated June 30, 2021 FINDINGS: Near complete opacification of the left hemithorax due to effusion, tumor and lung atelectasis, unchanged. Small area of aerated lungs is also unchanged. Left-sided chest tube is present. No appreciable pneumothorax. Mediastinal shift to the right. Right lung is clear. No acute osseous abnormality. IMPRESSION: No significant interval change. Electronically Signed   By: Keane Police D.O.   On: 07/02/2021 10:00   ECHOCARDIOGRAM LIMITED  Result Date: 07/02/2021    ECHOCARDIOGRAM LIMITED REPORT   Patient Name:   YANELLY CANTRELLE Date of Exam: 07/02/2021 Medical Rec #:  [id number]          Height:       62.0 in Accession #:    [id number]         Weight:       110.0 lb Date of Birth:  Sep 04, 1950          BSA:          1.483 m Patient Age:    6 years           BP:           101/73 mmHg Patient Gender: F                  HR:           102 bpm. Exam Location:  Inpatient Procedure: Limited Echo Indications:    Pericardial effusion I31.3  History:        Patient has prior history of Echocardiogram examinations, most                 recent 07/01/2021. Risk Factors:Diabetes and Dyslipidemia. Lung                 Cancer.  Sonographer:     Darlina Sicilian RDCS Referring Phys: [id number] [facility] A CHANDRASEKHAR  Conclusion(s)/Recommendation(s): Normal LV function, EF 60-65%. RV normal size and function. Small pericardial effusion along the RV free wall. No cardiac tamponade. Phineas Inches Electronically signed by Phineas Inches Signature Date/Time: 07/02/2021/9:05:55 AM    Final         Scheduled Meds:  amiodarone  200 mg Oral Daily   Chlorhexidine Gluconate Cloth  6 each Topical Daily   dexamethasone  4 mg Oral Q12H   docusate sodium  100 mg Oral BID   enoxaparin (LOVENOX) injection  30 mg Subcutaneous Q24H   feeding supplement  237 mL Oral TID BM   gabapentin  300 mg Oral QHS   insulin aspart  0-9 Units Subcutaneous TID WC   lacosamide  50 mg Oral BID   letrozole  2.5 mg Oral Daily   multivitamin with minerals  1 tablet Oral Daily   polyethylene glycol  17 g Oral Daily  sodium chloride flush  10 mL Other Q8H   thyroid  60 mg Oral Daily   zinc sulfate  220 mg Oral Daily   Continuous Infusions:   LOS: 3 days   Time spent: 65mins Greater than 50% of this time was spent in counseling, explanation of diagnosis, planning of further management, and coordination of care.   Voice Recognition Viviann Spare dictation system was used to create this note, attempts have been made to correct errors. Please contact the author with questions and/or clarifications.   Florencia Reasons, MD PhD FACP [facility]  Available via Epic secure chat 7am-7pm for nonurgent issues Please page for urgent issues To page the attending provider between 7A-7P or the covering provider during after hours 7P-7A, please log into the web site [email or web address] and access using universal [place] password for that web site. If you do not have the password, please call the hospital operator.    07/03/2021, 2:59 PM

## 2021-07-03 NOTE — Progress Notes (Signed)
Progress Note  Patient Name: Tami Stone Date of Encounter: 07/03/2021  Primary Cardiologist: None   Subjective   Pericardial effusion drain removed yesterday.  Discussions with palliative care, planning home with hospice.  However when I speak to her this morning, she is not sure that is what she wants to do.  Said that she needs to think about it more.  Reports chest pain when takes deep breath.  Dyspnea significantly improved.   Inpatient Medications    Scheduled Meds:  amiodarone  200 mg Oral Daily   Chlorhexidine Gluconate Cloth  6 each Topical Daily   dexamethasone  4 mg Oral Q12H   docusate sodium  100 mg Oral BID   enoxaparin (LOVENOX) injection  30 mg Subcutaneous Q24H   feeding supplement  237 mL Oral BID BM   gabapentin  300 mg Oral QHS   insulin aspart  0-9 Units Subcutaneous TID WC   lacosamide  50 mg Oral BID   letrozole  2.5 mg Oral Daily   multivitamin with minerals  1 tablet Oral Daily   polyethylene glycol  17 g Oral Daily   sodium chloride flush  10 mL Other Q8H   thyroid  60 mg Oral Daily   zinc sulfate  220 mg Oral Daily   Continuous Infusions:   PRN Meds: acetaminophen, albuterol, fentaNYL (SUBLIMAZE) injection, HYDROmorphone (DILAUDID) injection, HYDROmorphone HCl, ibuprofen, nitroGLYCERIN, ondansetron (ZOFRAN) IV   Vital Signs    Vitals:   07/03/21 0300 07/03/21 0400 07/03/21 0500 07/03/21 0700  BP: 92/70 94/69 110/76 116/82  Pulse: 96 93 (!) 104 (!) 101  Resp: 19 20 (!) 30 (!) 28  Temp:  98.1 F (36.7 C)    TempSrc:  Oral    SpO2: 94% 94% 95% 100%  Weight:      Height:        Intake/Output Summary (Last 24 hours) at 07/03/2021 0744 Last data filed at 07/03/2021 0400 Gross per 24 hour  Intake 150 ml  Output 80 ml  Net 70 ml    Filed Weights   06/30/21 1047  Weight: 49.9 kg    Telemetry    Sinus rhythm rate 90s to 100s- Personally Reviewed  Physical Exam   Gen: mild distress, thin malnourished female Neck: No  JVD Cardiac: Regular rate and rhythm no murmur Respiratory: Decreased breath sounds in the left GI: Soft, nontender, non-distended  MS: No  edema;  moves all extremities Integument: Skin feels warm Neuro:  At time of evaluation, alert and oriented to person/place/time/situation  Psych: Normal affect for situation   Labs    Chemistry Recent Labs  Lab 07/02/21 0107 07/02/21 0254 07/03/21 0323  NA 133* 132* 133*  K 5.6* 4.7 5.4*  CL 99 98 98  CO2 28 25 27   GLUCOSE 141* 190* 147*  BUN 16 18 17   CREATININE 0.55 0.50 0.55  CALCIUM 8.8* 8.7* 8.5*  GFRNONAA >60 >60 >60  ANIONGAP 6 9 8       Hematology Recent Labs  Lab 07/01/21 0050 07/02/21 0107 07/03/21 0323  WBC 17.5* 16.8* 15.4*  RBC 4.07 4.06 3.88  HGB 11.4* 10.9* 10.3*  HCT 34.1* 35.0* 33.8*  MCV 83.8 86.2 87.1  MCH 28.0 26.8 26.5  MCHC 33.4 31.1 30.5  RDW 18.1* 18.0* 18.1*  PLT 600* 596* 581*     Cardiac EnzymesNo results for input(s): TROPONINI in the last 168 hours. No results for input(s): TROPIPOC in the last 168 hours.   BNP Recent Labs  Lab  06/30/21 1103  BNP 55.9      DDimer No results for input(s): DDIMER in the last 168 hours.   Radiology    DG Chest Port 1 View  Result Date: 07/02/2021 CLINICAL DATA:  Recurrent left pleural effusion EXAM: PORTABLE CHEST 1 VIEW COMPARISON:  CT chest and chest radiographs dated June 30, 2021 FINDINGS: Near complete opacification of the left hemithorax due to effusion, tumor and lung atelectasis, unchanged. Small area of aerated lungs is also unchanged. Left-sided chest tube is present. No appreciable pneumothorax. Mediastinal shift to the right. Right lung is clear. No acute osseous abnormality. IMPRESSION: No significant interval change. Electronically Signed   By: Keane Police D.O.   On: 07/02/2021 10:00   ECHOCARDIOGRAM LIMITED  Result Date: 07/02/2021    ECHOCARDIOGRAM LIMITED REPORT   Patient Name:   Tami Stone Date of Exam: 07/02/2021 Medical  Rec #:  [id number]          Height:       62.0 in Accession #:    [id number]         Weight:       110.0 lb Date of Birth:  11/01/1950          BSA:          1.483 m Patient Age:    71 years           BP:           101/73 mmHg Patient Gender: F                  HR:           102 bpm. Exam Location:  Inpatient Procedure: Limited Echo Indications:    Pericardial effusion I31.3  History:        Patient has prior history of Echocardiogram examinations, most                 recent 07/01/2021. Risk Factors:Diabetes and Dyslipidemia. Lung                 Cancer.  Sonographer:    Darlina Sicilian RDCS Referring Phys: [id number] [facility] A CHANDRASEKHAR  Conclusion(s)/Recommendation(s): Normal LV function, EF 60-65%. RV normal size and function. Small pericardial effusion along the RV free wall. No cardiac tamponade. Phineas Inches Electronically signed by Phineas Inches Signature Date/Time: 07/02/2021/9:05:55 AM    Final    ECHOCARDIOGRAM LIMITED  Result Date: 07/01/2021    ECHOCARDIOGRAM LIMITED REPORT   Patient Name:   Tami Stone Date of Exam: 07/01/2021 Medical Rec #:  [id number]          Height:       62.0 in Accession #:    [id number]         Weight:       110.0 lb Date of Birth:  06/13/1951          BSA:          1.483 m Patient Age:    31 years           BP:           94/72 mmHg Patient Gender: F                  HR:           126 bpm. Exam Location:  Inpatient Procedure: Limited Echo Indications:    Pericardial Effusion  History:        Patient has prior history of Echocardiogram examinations, most  recent 06/30/2021.  Sonographer:    Jefferey Pica Referring Phys: [id number] A ARIDA  Sonographer Comments: Unable to obtain images from all windows due to dressings. IMPRESSIONS  1. Limited study to assess pericardial effusion; small pericardial effusion predominantly surrounding right atrium; effusion much smaller compared to 06/30/20.  2. Left ventricular ejection fraction, by estimation, is 50 to 55%.  The left ventricle has low normal function. The left ventricle has no regional wall motion abnormalities.  3. Right ventricular systolic function is normal. The right ventricular size is normal.  4. A small pericardial effusion is present.  5. The mitral valve is normal in structure.  6. The aortic valve has an indeterminant number of cusps.  7. The inferior vena cava is normal in size with greater than 50% respiratory variability, suggesting right atrial pressure of 3 mmHg. FINDINGS  Left Ventricle: Left ventricular ejection fraction, by estimation, is 50 to 55%. The left ventricle has low normal function. The left ventricle has no regional wall motion abnormalities. The left ventricular internal cavity size was normal in size. Right Ventricle: The right ventricular size is normal. Right ventricular systolic function is normal. Left Atrium: Left atrial size was normal in size. Right Atrium: Right atrial size was normal in size. Pericardium: A small pericardial effusion is present. Mitral Valve: The mitral valve is normal in structure. Tricuspid Valve: The tricuspid valve is normal in structure. Aortic Valve: The aortic valve has an indeterminant number of cusps. Venous: The inferior vena cava is normal in size with greater than 50% respiratory variability, suggesting right atrial pressure of 3 mmHg. Additional Comments: Limited study to assess pericardial effusion; small pericardial effusion predominantly surrounding right atrium; effusion much smaller compared to 06/30/20. LEFT VENTRICLE PLAX 2D LVIDd:         4.20 cm LVIDs:         3.00 cm LV PW:         1.10 cm LV IVS:        1.00 cm LVOT diam:     2.00 cm LVOT Area:     3.14 cm  IVC IVC diam: 2.00 cm LEFT ATRIUM         Index LA diam:    3.20 cm 2.16 cm/m   AORTA Ao Root diam: 3.50 cm Ao Asc diam:  3.50 cm  SHUNTS Systemic Diam: 2.00 cm Kirk Ruths MD Electronically signed by Kirk Ruths MD Signature Date/Time: 07/01/2021/11:59:27 AM    Final      Cardiac Studies   Echo 07/02/21: Normal LV function, EF 60-65%. RV normal  size and function. Small pericardial effusion along the RV free wall. No  cardiac tamponade.   Patient Profile     71 y.o. female stage IV cancer found to have cardiac tamponade  Assessment & Plan      Cardiac Tamponade: Status post pericardiocentesis on 06/30/2021 -Limited echo yesterday with small effusion.  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  However this morning she states that she is  not sure that is what she wants to do, wants to think more about it.  Appreciate palliative assistance   For questions or updates, please contact [place] Please consult [email or web address] for contact info under Cardiology/STEMI.      Signed, Donato Heinz, MD  07/03/2021, 7:44 AM

## 2021-07-03 NOTE — TOC Progression Note (Signed)
Transition of Care [facility]) - Progression Note    Patient Details  Name: Tami Stone MRN: [id number] Date of Birth: [birth year]  Transition of [facility] CM/SW Contact  Graves-Bigelow, Ocie Cornfield, RN Phone Number: 07/03/2021, 12:25 PM  Clinical Narrative:  Per palliative NP, Patient has decided to return home with palliative services. [place] will follow for outpatient palliative. Case Manager will follow the patient as she progressed for dme needs such as BSC and wheelchair in the home.    Expected Discharge Plan: Home w Hospice Care Barriers to Discharge: Continued Medical Work up  Expected Discharge Plan and Services Expected Discharge Plan: [place] In-house Referral: Hospice / Palliative Care Discharge Planning Services: CM Consult Post Acute Care Choice: Hospice Living arrangements for the past 2 months: Single Family Home                 DME Arranged: Bedside commode, Lightweight manual wheelchair with seat cushion DME Agency: [facility] [place] (Hospice to order equipment.) Date DME Agency Contacted: 07/02/21 Time DME Agency Contacted: 1157 Representative spoke with at DME Agency: Fabio Pierce HH Arranged: RN [facility] Agency: [facility] [place] ([place]) Date [place]: 07/02/21 Time [place]: [place] Representative spoke with at [place]: [place] ([place]) Interventions    Readmission Risk Interventions No flowsheet data found.

## 2021-07-03 NOTE — Progress Notes (Signed)
NAMEEarlyn Stone, MRN:  [id number], DOB:  Nov 26, 1950, LOS: 3 ADMISSION DATE:  06/30/2021 CONSULTATION DATE:  07/01/2021 REFERRING MD:  Tami Stone - TRH CHIEF COMPLAINT:  Malignant pleural effusion, PleurX catheter dysfunction   History of Present Illness:  71 year old woman who presented to [facility] 1/18 for SOB, found to have pericardial effusion with cardiac tamponade. 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 Recent admission 1/11 - 1/14 for worsening vasogenic edema in the setting of brain metastasis.  Presented to [facility] 1/18 for worsening SOB after she noted that her PleurX catheter was not draining well. Denies CP/pressure or tightness on admission. Endorses palpitations. Initial concern for PE in the setting of malignancy; CTA Chest was obtained and demonstrated worsened L pleural effusion and pericardial effusion with RV collapse concern for cardiac tamponade. Cardiology was consulted. Patient underwent emergently pericardiocentesis 1/18.  PCCM consulted 1/19 for ongoing malignant pleural effusion/PleurX catheter dysfunction.  Pertinent Medical History:   Past Medical History:  Diagnosis Date   Diabetes ([place])    Does not check bs at home   Dyspnea    High cholesterol    Lung cancer ([place])    04/2020   Significant Hospital Events: Including procedures, antibiotic start and stop dates in addition to other pertinent events   1/18 - Presented to [facility] for SOB. CTA Chest negative for PE, showed worsening pleural effusion and pericardial effusion with RV collapse/concern for cardiac tamponade. S/p pericardiocentesis 1/18. 1/19 - PCCM consulted for PleurX catheter dysfunction in the setting of malignant pleural effusion. tPA/dornase administered. 1/20 - PleurX with 16mL drained since lytic instillation. No significant improvement in  drainage. CXR with persistent complete opacification of L hemithorax.  Interim History / Subjective:   Feels comfortable this morning No significant complaints Baseline SOB PleurX with 10-31mL output over past 24 hours  Patient wishes to leave PleurX catheter connected to [place] until deciding on next steps with pericardial effusion.   Objective:  Blood pressure 120/86, pulse 100, temperature 97.6 F (36.4 C), temperature source Oral, resp. rate (!) 31, height 5\' 2"  (1.575 m), weight 48.1 kg, SpO2 94 %.        Intake/Output Summary (Last 24 hours) at 07/03/2021 1003 Last data filed at 07/03/2021 0800 Gross per 24 hour  Intake 160 ml  Output 130 ml  Net 30 ml   Filed Weights   06/30/21 1047 07/03/21 0938  Weight: 49.9 kg 48.1 kg   Physical Examination: General: Chronically ill-appearing woman in NAD. Cachectic. HEENT: [place]/AT, anicteric sclera, PERRL, moist mucous membranes. Neuro: Awake, oriented x 4. Responds to verbal stimuli. Following commands consistently. Moves all 4 extremities spontaneously. CV: tachycardic, regular rhythm, no m/g/r. PULM: diminished breath sounds on left. PleurX cathter to lateral L chest with scant serosanguineous output. GI: Soft, nontender, nondistended. Normoactive bowel sounds. Extremities: No LE edema noted. Skin: Warm/dry, no rashes.  Resolved Hospital Problem List:    Assessment & Plan:  [name] is seen in consultation at the request of [place] for further evaluation and management of malignant effusion and PleurX catheter dysfunction.  Malignant pleural effusion Malfunctioning PleurX catheter CCM consulted for PleurX catheter dysfunction. Initially placed 05/28/2021 by Dr. Valeta Harms for loculated malignant L pleural effusion. S/p thoracentesis. Path c/w adenocarcinoma. - PleurX drainage system converted to [place] with adapter - S/p tPA/dornase x 1 1/19 without significant improvement in tube drainage/output (32mL) - CXR grossly  unchanged  post-lytics - CT Chest from admission reviewed, tube appears to be inferior to any drainable effusion, appears primarily surrounded by tumor. Loculated effusion in LUL possibly accessible, though intervening upon this may be futile and would likely result in fluid reaccumulation and more discomfort. Will defer additional intervention at this time. - Continue PleurX to drain with -42mmHg suction until patient has made further decisions regarding her care regarding hospice - We will cap the pleurX drain when she is ready to leave the hospital.  Cardiac tamponade Malignant pericardial effusion Presented 1/18 with SOB. CTA Chest negative for PE, but demonstrated worsening pleural effusion/pericardial effusion. RV collapse noted with c/f tamponade. 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 Please call [place] 7p-7a. [phone]

## 2021-07-04 DIAGNOSIS — I314 Cardiac tamponade: Secondary | ICD-10-CM | POA: Diagnosis not present

## 2021-07-04 DIAGNOSIS — I471 Supraventricular tachycardia: Secondary | ICD-10-CM | POA: Diagnosis not present

## 2021-07-04 LAB — GLUCOSE, CAPILLARY
Glucose-Capillary: 126 mg/dL — ABNORMAL HIGH (ref 70–99)
Glucose-Capillary: 144 mg/dL — ABNORMAL HIGH (ref 70–99)
Glucose-Capillary: 180 mg/dL — ABNORMAL HIGH (ref 70–99)
Glucose-Capillary: 105 mg/dL — ABNORMAL HIGH (ref 70–99)

## 2021-07-04 LAB — BASIC METABOLIC PANEL
Anion gap: 12 (ref 5–15)
BUN: 17 mg/dL (ref 8–23)
CO2: 26 mmol/L (ref 22–32)
Calcium: 9 mg/dL (ref 8.9–10.3)
Chloride: 95 mmol/L — ABNORMAL LOW (ref 98–111)
Creatinine, Ser: 0.43 mg/dL — ABNORMAL LOW (ref 0.44–1.00)
GFR, Estimated: >60 mL/min (ref >60)
Glucose, Bld: 97 mg/dL (ref 70–99)
Potassium: 4.7 mmol/L (ref 3.5–5.1)
Sodium: 133 mmol/L — ABNORMAL LOW (ref 135–145)

## 2021-07-04 LAB — CBC
HCT: 34.4 % — ABNORMAL LOW (ref 36.0–46.0)
Hemoglobin: 10.6 g/dL — ABNORMAL LOW (ref 12.0–15.0)
MCH: 26.6 pg (ref 26.0–34.0)
MCHC: 30.8 g/dL (ref 30.0–36.0)
MCV: 86.4 fL (ref 80.0–100.0)
Platelets: 649 10*3/uL — ABNORMAL HIGH (ref 150–400)
RBC: 3.98 MIL/uL (ref 3.87–5.11)
RDW: 18.2 % — ABNORMAL HIGH (ref 11.5–15.5)
WBC: 17.3 10*3/uL — ABNORMAL HIGH (ref 4.0–10.5)
nRBC: 0 % (ref 0.0–0.2)

## 2021-07-04 NOTE — Progress Notes (Signed)
PROGRESS NOTE    Tami Stone  XNA:[id number] DOB: 04/12/1951 DOA: 06/30/2021 PCP: Gaynelle Cage, MD    Chief Complaint  Patient presents with   Shortness of Breath    Brief Narrative:   Tami Stone is a 71 y.o. 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 on femara , h/o  Metastatic lung CA, she is sent to ED due to feeling sob, hypoxia, found to have large pericardial effusion and cardiac tamponade,  she is urgently admitted to cardiology service for pericardiocentesis and pericardiodrain placement    Subjective:   She denies acute needs, reports some chest pain with deep breathing, less tachycardia  she is on room air   Assessment & Plan:   Principal Problem:   Cardiac tamponade Active Problems:   Recurrent left pleural effusion   Pericardial effusion   Atrial tachycardia (HCC)   Malignant pericardial effusion/atrial tachycardia -Require urgent pericardial drain placed on admission, drain removed, cardiology plan to repeat echocardiogram on Monday to decide on next step -On amiodarone, currently sinus rhythm -Management per cardiology   Malignant pleural effusion/ Pleurx malfunction Management per  pccm      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  -Seen by oncology during last hospitalization ,patient is not interested in chemotherapy or immunotherapy.  - seen by Radiation oncology last admission who recommended whole brain XRT,  the patient is undecided about radiation therapy either.   -palliative care following     Dysphagia due to extrinsic compression from cancer Discussed with her regarding liquid /soft diet She would like to have regular diet so she could have more choices , regular diet ordered per patient's preference     Stage III breast cancer On femara     Noninsulin-dependent type 2 diabetes , controlled  A1c 5.4%.  On metformin at home. 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  Also has poor oral intake , improving    Leukocytosis: Likely reactive, also has been on steroid, does not appear to have infection   Thrombocytosis : likely reactive -Continue monitoring.     Normocytic anemia ,Suspect secondary to underlying malignancy -no evidence of acute blood loss --Received 2 unit PRBC during last admission -Hgb remains above10     Overall very poor prognosis, palliative care consulted , she is not ready for hospice, agree with being DNR and outpatient palliative care   Body mass index is 19.4 kg/m..  .   Unresulted Labs (From admission, onward)     Start     Ordered   07/07/21 0500  Creatinine, serum  (enoxaparin (LOVENOX)    CrCl < 30 ml/min)  Weekly,   R     Comments: while on enoxaparin therapy.    06/30/21 1748   07/05/21 0500  CBC  Tomorrow morning,   R       Question:  Specimen collection method  Answer:  Lab=Lab collect   07/04/21 1544   07/05/21 5427  Basic metabolic panel  Tomorrow morning,   R       Question:  Specimen collection method  Answer:  Lab=Lab collect   07/04/21 1544   07/05/21 0500  Magnesium  Tomorrow  morning,   R       Question:  Specimen collection method  Answer:  Lab=Lab collect   07/04/21 1544              DVT prophylaxis: enoxaparin (LOVENOX) injection 30 mg Start: 07/02/21 1000   Code Status:DNR Family Communication: family at bedside on 1/18, 1/19, 1/21 Disposition:   Status is: Inpatient  Dispo: The patient is from: home               Anticipated d/c is to: likely home with outpatient palliative care              Anticipated d/c date is: needs repeat echo, needs cardiology clearance                 Consultants:   Cardiology  Palliative care Pulmonary/critical care    Objective: Vitals:   07/04/21 1400 07/04/21 1554 07/04/21 1600 07/04/21 1700  BP: 109/80  103/71 126/89  Pulse: (!) 102  (!) 103 (!) 109  Resp: (!) 31  (!) 21 (!) 21  Temp:  98.4 F (36.9 C)    TempSrc:  Oral    SpO2: 94%  93% 93%  Weight:      Height:        Intake/Output Summary (Last 24 hours) at 07/04/2021 1748 Last data filed at 07/04/2021 1600 Gross per 24 hour  Intake 630 ml  Output 550 ml  Net 80 ml   Filed Weights   06/30/21 1047 07/03/21 0938  Weight: 49.9 kg 48.1 kg    Examination:  General exam: thin, frail, alert, awake, communicative Respiratory system: Diminished at left side, slightly tachypneic, no accessory muscle use, left-sided chest tube connected during.  Cardiovascular system: less Sinus tachycardia Gastrointestinal system: Abdomen is nondistended, soft and nontender.  Normal bowel sounds heard. Central nervous system: Alert and oriented. No focal neurological deficits. Extremities:  no edema Skin: No rashes, lesions or ulcers Psychiatry: Judgement and insight appear normal. Mood & affect appropriate.     Data Reviewed: I have personally reviewed following labs and imaging studies  CBC: Recent Labs  Lab 06/30/21 1103 07/01/21 0050 07/02/21 0107 07/03/21 0323 07/04/21 0037  WBC 16.6* 17.5* 16.8* 15.4* 17.3*  NEUTROABS 14.1*  --   --   --   --   HGB 10.9* 11.4* 10.9* 10.3* 10.6*  HCT 34.9* 34.1* 35.0* 33.8* 34.4*  MCV 86.8 83.8 86.2 87.1 86.4  PLT 686* 600* 596* 581* 649*    Basic Metabolic Panel: Recent Labs  Lab 07/01/21 0050 07/02/21 0107 07/02/21 0254 07/03/21 0323 07/04/21 0037  NA 127* 133* 132* 133* 133*  K 3.6 5.6* 4.7 5.4* 4.7  CL 94* 99 98 98 95*  CO2 25 28 25 27 26   GLUCOSE 150* 141* 190* 147* 97  BUN 23 16 18 17 17   CREATININE 0.61 0.55 0.50 0.55 0.43*  CALCIUM 8.0* 8.8* 8.7* 8.5* 9.0    GFR: Estimated Creatinine Clearance: 49 mL/min (A) (by C-G  formula based on SCr of 0.43 mg/dL (L)).  Liver Function Tests: No results for input(s): AST, ALT, ALKPHOS, BILITOT, PROT, ALBUMIN in the last 168 hours.   CBG: Recent Labs  Lab 07/03/21 1606 07/03/21 2056 07/04/21 0626 07/04/21 1209 07/04/21 1553  GLUCAP 226* 112* 126* 144* 180*     Recent Results (from the past 240 hour(s))  Culture, blood (routine x 2)     Status: None (Preliminary result)   Collection Time: 06/30/21 11:03 AM   Specimen:  BLOOD  Result Value Ref Range Status   Specimen Description   Final    BLOOD LEFT ANTECUBITAL Performed at [facility] [address] [place], [place] [address]    Special Requests   Final    Blood Culture adequate volume BOTTLES DRAWN AEROBIC AND ANAEROBIC Performed at [facility] [address], [place], [place] [address]    Culture   Final    NO GROWTH 4 DAYS Performed at [facility], [place] [address], [place], [place] [address]    Report Status PENDING  Incomplete  Culture, blood (routine x 2)     Status: None (Preliminary result)   Collection Time: 06/30/21 11:08 AM   Specimen: BLOOD  Result Value Ref Range Status   Specimen Description   Final    BLOOD BLOOD LEFT HAND Performed at [facility] [address], [place], [place] [address]    Special Requests   Final    Blood Culture results may not be optimal due to an inadequate volume of blood received in culture bottles BOTTLES DRAWN AEROBIC AND ANAEROBIC Performed at [facility], [place] [address] [place], [place] [address]    Culture   Final    NO GROWTH 4 DAYS Performed at [facility], [place] [address] [place], [place] [address]    Report Status PENDING  Incomplete  Resp Panel by RT-PCR (Flu A&B, Covid)     Status: None   Collection Time: 06/30/21 12:41 PM   Specimen: Nasopharyngeal(NP) swabs in vial transport medium  Result Value Ref Range Status   SARS Coronavirus 2 by RT  PCR NEGATIVE NEGATIVE Final    Comment: (NOTE) SARS-CoV-2 target nucleic acids are NOT DETECTED.  The SARS-CoV-2 RNA is generally detectable in upper respiratory specimens during the acute phase of infection. The lowest concentration of SARS-CoV-2 viral copies this assay can detect is 138 copies/mL. A negative result does not preclude SARS-Cov-2 infection and should not be used as the sole basis for treatment or other patient management decisions. A negative result may occur with  improper specimen collection/handling, submission of specimen other than nasopharyngeal swab, presence of viral mutation(s) within the areas targeted by this assay, and inadequate number of viral copies(<138 copies/mL). A negative result must be combined with clinical observations, patient history, and epidemiological information. The expected result is Negative.  Fact Sheet for Patients:  EntrepreneurPulse.com.au  Fact Sheet for Healthcare Providers:  IncredibleEmployment.be  This test is no t yet approved or cleared by the [place] FDA and  has been authorized for detection and/or diagnosis of SARS-CoV-2 by FDA under an Emergency Use Authorization (EUA). This EUA will remain  in effect (meaning this test can be used) for the duration of the COVID-19 declaration under Section 564(b)(1) of the Act, 21 U.S.C.section 360bbb-3(b)(1), unless the authorization is terminated  or revoked sooner.       Influenza A by PCR NEGATIVE NEGATIVE Final   Influenza B by PCR NEGATIVE NEGATIVE Final    Comment: (NOTE) The Xpert Xpress SARS-CoV-2/FLU/RSV plus assay is intended as an aid in the diagnosis of influenza from Nasopharyngeal swab specimens and should not be used as a sole basis for treatment. Nasal washings and aspirates are unacceptable for Xpert Xpress SARS-CoV-2/FLU/RSV testing.  Fact Sheet for Patients: EntrepreneurPulse.com.au  Fact Sheet for  Healthcare Providers: IncredibleEmployment.be  This test is not yet approved or cleared by the [place] FDA and has been authorized for detection  and/or diagnosis of SARS-CoV-2 by FDA under an Emergency Use Authorization (EUA). This EUA will remain in effect (meaning this test can be used) for the duration of the COVID-19 declaration under Section 564(b)(1) of the Act, 21 U.S.C. section 360bbb-3(b)(1), unless the authorization is terminated or revoked.  Performed at [facility], [place] [address] [place], [place] [address]   Culture, body fluid w Gram Stain-bottle     Status: None (Preliminary result)   Collection Time: 06/30/21  5:02 PM   Specimen: Fluid  Result Value Ref Range Status   Specimen Description FLUID PERICARDIAL  Final   Special Requests BOTTLES DRAWN AEROBIC AND ANAEROBIC  Final   Culture   Final    NO GROWTH 4 DAYS Performed at [facility], [place] [address], [place], [place] [address]    Report Status PENDING  Incomplete  Gram stain     Status: None   Collection Time: 06/30/21  5:02 PM   Specimen: Fluid  Result Value Ref Range Status   Specimen Description FLUID PERICARDIAL  Final   Special Requests NONE  Final   Gram Stain   Final    FEW WBC PRESENT, PREDOMINANTLY MONONUCLEAR NO ORGANISMS SEEN Performed at [facility], [place] [address] [place], [place] [address]    Report Status 06/30/2021 FINAL  Final  MRSA Next Gen by PCR, Nasal     Status: None   Collection Time: 06/30/21  5:55 PM   Specimen: Nasal Mucosa; Nasal Swab  Result Value Ref Range Status   MRSA by PCR Next Gen NOT DETECTED NOT DETECTED Final    Comment: (NOTE) The GeneXpert MRSA Assay (FDA approved for NASAL specimens only), is one component of a comprehensive MRSA colonization surveillance program. It is not intended to diagnose MRSA infection nor to guide or monitor treatment for MRSA infections. Test performance is not FDA approved in  patients less than 54 years old. Performed at [facility], [place] [address], [place], [place] [address]          Radiology Studies: No results found.      Scheduled Meds:  amiodarone  200 mg Oral Daily   Chlorhexidine Gluconate Cloth  6 each Topical Daily   dexamethasone  4 mg Oral Q12H   docusate sodium  100 mg Oral BID   enoxaparin (LOVENOX) injection  30 mg Subcutaneous Q24H   feeding supplement  237 mL Oral TID BM   gabapentin  300 mg Oral QHS   insulin aspart  0-9 Units Subcutaneous TID WC   lacosamide  50 mg Oral BID   letrozole  2.5 mg Oral Daily   mouth rinse  15 mL Mouth Rinse BID   multivitamin with minerals  1 tablet Oral Daily   polyethylene glycol  17 g Oral Daily   sodium chloride flush  10 mL Other Q8H   thyroid  60 mg Oral Daily   zinc sulfate  220 mg Oral Daily   Continuous Infusions:   LOS: 4 days   Time spent: 49mins Greater than 50% of this time was spent in counseling, explanation of diagnosis, planning of further management, and coordination of care.   Voice Recognition Viviann Spare dictation system was used to create this note, attempts have been made to correct errors. Please contact the author with questions and/or clarifications.   Florencia Reasons, MD PhD FACP [facility]  Available via Epic secure chat 7am-7pm for nonurgent issues Please page for urgent issues To page  the attending provider between 7A-7P or the covering provider during after hours 7P-7A, please log into the web site [email or web address] and access using universal [place] password for that web site. If you do not have the password, please call the hospital operator.    07/04/2021, 5:48 PM

## 2021-07-04 NOTE — Progress Notes (Signed)
° °  Palliative Medicine Inpatient Follow Up Note  HPI:  71 y.o. 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 Workup reveals malignant pericardial and pleural effusion. Pericardial drain placed and has been dc'd. Continues with chest tube. Palliative medicine consulted for [place].   Today's Discussion (07/04/2021):   *Please note that this is a verbal dictation therefore any spelling or grammatical errors are due to the "[place] [employer]" system interpretation.  Chart reviewed inclusive of progress notes, laboratory results, and diagnostic images.   I met with Natalyia this morning. She shares that she feels clear on the plan from a Palliative perspective. She endorses that tomorrow she will have another ultrasound of her heart to determine the next steps. Reviewed that we will continue to shadow her chart.  Evelen shares that she has no significant symptom burden this morning.   Objective Assessment: Vital Signs Vitals:   07/04/21 0900 07/04/21 1000  BP: 103/85 119/87  Pulse: 99 99  Resp: (!) 30 (!) 24  Temp:    SpO2: 96% 98%    Intake/Output Summary (Last 24 hours) at 07/04/2021 1028 Last data filed at 07/04/2021 0800 Gross per 24 hour  Intake 870 ml  Output 560 ml  Net 310 ml    Last Weight  Most recent update: 07/03/2021  9:39 AM    Weight  48.1 kg (106 lb 0.7 oz)            Gen:  Older Caucasian G HEENT: moist mucous membranes CV: Irregular rate and rhythm  PULM: ON RA, breathing is even and nonlabored ABD: soft/nontender EXT: No edema Neuro: Alert and oriented x3  SUMMARY OF RECOMMENDATIONS   DNAR/DNI  Patient expresses not feeling ready for hospice after consideration  Plan for OP Palliative support and if her condition  should worsen to then consider hospice - [facility] is aware  Ongoing incremental Palliative care support  On discharge can continue ativan and dilaudid orally as needed  MDM - Moderate ______________________________________________________________________________________ [place] Team Team Cell Phone: [phone] Please utilize secure chat with additional questions, if there is no response within 30 minutes please call the above phone number  Palliative Medicine Team providers are available by phone from 7am to 7pm daily and can be reached through the team cell phone.  Should this patient require assistance outside of these hours, please call the patient's attending physician.

## 2021-07-04 NOTE — Progress Notes (Signed)
Progress Note  Patient Name: Tami Stone Date of Encounter: 07/04/2021  Primary Cardiologist: None   Subjective   BP 118/82 this morning.  Denies any dyspnea.  Reports chest pain when takes deep breath   Inpatient Medications    Scheduled Meds:  amiodarone  200 mg Oral Daily   Chlorhexidine Gluconate Cloth  6 each Topical Daily   dexamethasone  4 mg Oral Q12H   docusate sodium  100 mg Oral BID   enoxaparin (LOVENOX) injection  30 mg Subcutaneous Q24H   feeding supplement  237 mL Oral TID BM   gabapentin  300 mg Oral QHS   insulin aspart  0-9 Units Subcutaneous TID WC   lacosamide  50 mg Oral BID   letrozole  2.5 mg Oral Daily   mouth rinse  15 mL Mouth Rinse BID   multivitamin with minerals  1 tablet Oral Daily   polyethylene glycol  17 g Oral Daily   sodium chloride flush  10 mL Other Q8H   thyroid  60 mg Oral Daily   zinc sulfate  220 mg Oral Daily   Continuous Infusions:   PRN Meds: acetaminophen, albuterol, fentaNYL (SUBLIMAZE) injection, HYDROmorphone HCl, ibuprofen, nitroGLYCERIN, ondansetron (ZOFRAN) IV   Vital Signs    Vitals:   07/04/21 0500 07/04/21 0600 07/04/21 0623 07/04/21 0700  BP: 93/70 93/73  118/82  Pulse: 96 94 90 100  Resp: 15 14 (!) 22 (!) 22  Temp:   97.8 F (36.6 C)   TempSrc:   Rectal   SpO2: 95% 95% 95% 97%  Weight:      Height:        Intake/Output Summary (Last 24 hours) at 07/04/2021 1505 Last data filed at 07/04/2021 6979 Gross per 24 hour  Intake 850 ml  Output 600 ml  Net 250 ml    Filed Weights   06/30/21 1047 07/03/21 0938  Weight: 49.9 kg 48.1 kg    Telemetry    Sinus rhythm rate 90s to 100s- Personally Reviewed  Physical Exam   Gen: mild distress, thin malnourished female Neck: No JVD Cardiac: Regular rate and rhythm no murmur Respiratory: Decreased breath sounds in the left GI: Soft, nontender, non-distended  MS: No  edema;  moves all extremities Integument: Skin feels warm Neuro:  At time of  evaluation, alert and oriented to person/place/time/situation  Psych: Normal affect for situation   Labs    Chemistry Recent Labs  Lab 07/02/21 0254 07/03/21 0323 07/04/21 0037  NA 132* 133* 133*  K 4.7 5.4* 4.7  CL 98 98 95*  CO2 25 27 26   GLUCOSE 190* 147* 97  BUN 18 17 17   CREATININE 0.50 0.55 0.43*  CALCIUM 8.7* 8.5* 9.0  GFRNONAA >60 >60 >60  ANIONGAP 9 8 12       Hematology Recent Labs  Lab 07/02/21 0107 07/03/21 0323 07/04/21 0037  WBC 16.8* 15.4* 17.3*  RBC 4.06 3.88 3.98  HGB 10.9* 10.3* 10.6*  HCT 35.0* 33.8* 34.4*  MCV 86.2 87.1 86.4  MCH 26.8 26.5 26.6  MCHC 31.1 30.5 30.8  RDW 18.0* 18.1* 18.2*  PLT 596* 581* 649*     Cardiac EnzymesNo results for input(s): TROPONINI in the last 168 hours. No results for input(s): TROPIPOC in the last 168 hours.   BNP Recent Labs  Lab 06/30/21 1103  BNP 55.9      DDimer No results for input(s): DDIMER in the last 168 hours.   Radiology    DG Chest [place] 1  View  Result Date: 07/02/2021 CLINICAL DATA:  Recurrent left pleural effusion EXAM: PORTABLE CHEST 1 VIEW COMPARISON:  CT chest and chest radiographs dated June 30, 2021 FINDINGS: Near complete opacification of the left hemithorax due to effusion, tumor and lung atelectasis, unchanged. Small area of aerated lungs is also unchanged. Left-sided chest tube is present. No appreciable pneumothorax. Mediastinal shift to the right. Right lung is clear. No acute osseous abnormality. IMPRESSION: No significant interval change. Electronically Signed   By: Keane Police D.O.   On: 07/02/2021 10:00   ECHOCARDIOGRAM LIMITED  Result Date: 07/02/2021    ECHOCARDIOGRAM LIMITED REPORT   Patient Name:   Tami Stone Date of Exam: 07/02/2021 Medical Rec #:  [id number]          Height:       62.0 in Accession #:    [id number]         Weight:       110.0 lb Date of Birth:  1951/05/18          BSA:          1.483 m Patient Age:    71 years           BP:           101/73 mmHg  Patient Gender: F                  HR:           102 bpm. Exam Location:  Inpatient Procedure: Limited Echo Indications:    Pericardial effusion I31.3  History:        Patient has prior history of Echocardiogram examinations, most                 recent 07/01/2021. Risk Factors:Diabetes and Dyslipidemia. Lung                 Cancer.  Sonographer:    Darlina Sicilian RDCS Referring Phys: [id number] [facility] A CHANDRASEKHAR  Conclusion(s)/Recommendation(s): Normal LV function, EF 60-65%. RV normal size and function. Small pericardial effusion along the RV free wall. No cardiac tamponade. Phineas Inches Electronically signed by Phineas Inches Signature Date/Time: 07/02/2021/9:05:55 AM    Final     Cardiac Studies   Echo 07/02/21: Normal LV function, EF 60-65%. RV normal  size and function. Small pericardial effusion along the RV free wall. No  cardiac tamponade.   Patient Profile     71 y.o. female stage IV cancer found to have cardiac tamponade  Assessment & Plan      Cardiac Tamponade: Status post pericardiocentesis on 06/30/2021 -Limited echo 1/20 with small effusion.  Drain removed -Plan repeat echo tomorrow.  If recurring effusion will need discussion with cardiac surgery about pericardial window.  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   History of breast cance r Controlled NIDDM-2 with neuropathy  Hypothyroidism Hyponatremia:   Thrombocytosis Anemia, normocytic - Per discussion with palliative care 1/20, plan was to go home with hospice.  However patient had further discussion with palliative yesterday and decided not ready for hospice yet   For questions or updates, please contact [place] Please consult [email or web address] for contact info under Cardiology/STEMI.      Signed, Donato Heinz, MD  07/04/2021, 7:28 AM

## 2021-07-04 NOTE — Plan of Care (Signed)

## 2021-07-05 ENCOUNTER — Inpatient Hospital Stay (HOSPITAL_COMMUNITY): Payer: Medicare Other

## 2021-07-05 DIAGNOSIS — D649 Anemia, unspecified: Secondary | ICD-10-CM | POA: Diagnosis present

## 2021-07-05 DIAGNOSIS — I3139 Other pericardial effusion (noninflammatory): Secondary | ICD-10-CM

## 2021-07-05 DIAGNOSIS — E119 Type 2 diabetes mellitus without complications: Secondary | ICD-10-CM

## 2021-07-05 DIAGNOSIS — R1310 Dysphagia, unspecified: Secondary | ICD-10-CM

## 2021-07-05 DIAGNOSIS — C3490 Malignant neoplasm of unspecified part of unspecified bronchus or lung: Secondary | ICD-10-CM | POA: Diagnosis present

## 2021-07-05 DIAGNOSIS — J90 Pleural effusion, not elsewhere classified: Secondary | ICD-10-CM | POA: Diagnosis not present

## 2021-07-05 DIAGNOSIS — D72829 Elevated white blood cell count, unspecified: Secondary | ICD-10-CM | POA: Diagnosis present

## 2021-07-05 DIAGNOSIS — E871 Hypo-osmolality and hyponatremia: Secondary | ICD-10-CM

## 2021-07-05 DIAGNOSIS — I314 Cardiac tamponade: Secondary | ICD-10-CM | POA: Diagnosis not present

## 2021-07-05 DIAGNOSIS — I471 Supraventricular tachycardia: Secondary | ICD-10-CM | POA: Diagnosis not present

## 2021-07-05 DIAGNOSIS — D75839 Thrombocytosis, unspecified: Secondary | ICD-10-CM | POA: Diagnosis present

## 2021-07-05 DIAGNOSIS — E039 Hypothyroidism, unspecified: Secondary | ICD-10-CM | POA: Diagnosis present

## 2021-07-05 LAB — CBC
HCT: 35 % — ABNORMAL LOW (ref 36.0–46.0)
Hemoglobin: 10.9 g/dL — ABNORMAL LOW (ref 12.0–15.0)
MCH: 26.7 pg (ref 26.0–34.0)
MCHC: 31.1 g/dL (ref 30.0–36.0)
MCV: 85.8 fL (ref 80.0–100.0)
Platelets: 545 10*3/uL — ABNORMAL HIGH (ref 150–400)
RBC: 4.08 MIL/uL (ref 3.87–5.11)
RDW: 18.3 % — ABNORMAL HIGH (ref 11.5–15.5)
WBC: 20.8 10*3/uL — ABNORMAL HIGH (ref 4.0–10.5)
nRBC: 0 % (ref 0.0–0.2)

## 2021-07-05 LAB — ECHOCARDIOGRAM LIMITED
Calc EF: 60.8 %
Height: 62 in
S' Lateral: 2.6 cm
Single Plane A2C EF: 59.7 %
Single Plane A4C EF: 62.1 %
Weight: 1664.91 oz

## 2021-07-05 LAB — BASIC METABOLIC PANEL
Anion gap: 7 (ref 5–15)
BUN: 19 mg/dL (ref 8–23)
CO2: 27 mmol/L (ref 22–32)
Calcium: 9 mg/dL (ref 8.9–10.3)
Chloride: 100 mmol/L (ref 98–111)
Creatinine, Ser: 0.42 mg/dL — ABNORMAL LOW (ref 0.44–1.00)
GFR, Estimated: >60 mL/min (ref >60)
Glucose, Bld: 127 mg/dL — ABNORMAL HIGH (ref 70–99)
Potassium: 4.6 mmol/L (ref 3.5–5.1)
Sodium: 134 mmol/L — ABNORMAL LOW (ref 135–145)

## 2021-07-05 LAB — GLUCOSE, CAPILLARY
Glucose-Capillary: 116 mg/dL — ABNORMAL HIGH (ref 70–99)
Glucose-Capillary: 98 mg/dL (ref 70–99)
Glucose-Capillary: 174 mg/dL — ABNORMAL HIGH (ref 70–99)

## 2021-07-05 LAB — CULTURE, BODY FLUID W GRAM STAIN -BOTTLE: Culture: NO GROWTH

## 2021-07-05 LAB — CULTURE, BLOOD (ROUTINE X 2)
Culture: NO GROWTH
Culture: NO GROWTH
Special Requests: ADEQUATE

## 2021-07-05 LAB — MAGNESIUM: Magnesium: 1.9 mg/dL (ref 1.7–2.4)

## 2021-07-05 MED ORDER — ALTEPLASE 2 MG IJ SOLR
10.0000 mg | Freq: Once | INTRAMUSCULAR | Status: AC
  Administered 2021-07-05: 10 mg via INTRAPLEURAL
  Filled 2021-07-05: qty 10

## 2021-07-05 MED ORDER — STERILE WATER FOR INJECTION IJ SOLN
5.0000 mg | Freq: Once | RESPIRATORY_TRACT | Status: AC
  Administered 2021-07-05: 5 mg via INTRAPLEURAL
  Filled 2021-07-05: qty 5

## 2021-07-05 NOTE — Hospital Course (Addendum)
71 year old female with medical history significant for breast cancer on Femara, metastatic lung cancer with mets to bone, brain, pleural, type II DM, HLD, chronic left-sided large pleural effusion, anemia, hypothyroidism, neuropathy and syncope, recent hospitalization 06/23/2021 - 06/26/2021 for worsening vasogenic cerebral edema due to worsening metastatic brain lesions noted on MRI, neuro-oncology consulted and recommended Decadron and Vimpat, oncology consulted but patient is not interested in chemotherapy or immunotherapy, radiation oncology consulted and patient was undecided about radiation therapy either, now presented with dyspnea, hypoxia and found to have large pericardial effusion with cardiac tamponade.  She underwent pericardiocentesis with placement of pericardial drain 06/30/2021 which has since been removed.  Cardiology initially admitted and then transferred to [facility] on 1/21.  Trivial pericardial effusion on repeat echo.  PCCM managing Pleurx catheter with tPA through it for improved drainage.  Oncology consulted and patient's ongoing reluctance to chemotherapy or radiation treatment.

## 2021-07-05 NOTE — Progress Notes (Signed)
PROGRESS NOTE   Tami Stone  EML:[id number]    DOB: 04/29/1951    DOA: 06/30/2021  PCP: Gaynelle Cage, MD   I have briefly reviewed patients previous medical records in [facility].  Chief Complaint  Patient presents with   Shortness of Breath    Hospital Course:  71 year old female with medical history significant for breast cancer on Femara, metastatic lung cancer with mets to bone, brain, pleural, type II DM, HLD, chronic left-sided large pleural effusion, anemia, hypothyroidism, neuropathy and syncope, recent hospitalization 06/23/2021 - 06/26/2021 for worsening vasogenic cerebral edema due to worsening metastatic brain lesions noted on MRI, neuro-oncology consulted and recommended Decadron and Vimpat, oncology consulted but patient is not interested in chemotherapy or immunotherapy, radiation oncology consulted and patient was undecided about radiation therapy either, now presented with dyspnea, hypoxia and found to have large pericardial effusion with cardiac tamponade.  She underwent pericardiocentesis with placement of pericardial drain 06/30/2021 which has since been removed.  Cardiology initially admitted and then transferred to [facility] on 1/21.  Awaiting limited echo 1/23.   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 Limited echo 1/20 with small effusion. Cardiology follow-up appreciated. Repeat 2D echo 1/23 shows trivial pericardial effusion.  Atrial tachycardia [facility]) Cardiology following. Remains in [place] on  telemetry. Continue amiodarone 200 Mg daily.  Recurrent left pleural effusion Malignant and loculated pleural effusion Malfunctioning Pleurx catheter  PCCM was consulted. S/p tPA/dornase x1 on 1/19 without significant improvement in chest tube output. As per PCCM note 1/21 and there review of CT chest, differing additional interventions at this time.  Continuing Pleurx to drain with negative suction until patient has made further decisions regarding her care regarding hospice. Plan is to Her Pleurx drain when she is ready to leave the hospital.  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  Continuing Decadron and Vimpat. Ongoing discussion with patient/oncology. Per oncology note last admission, patient not interested in chemotherapy or immunotherapy. Undecided about brain XRT. PMT input 1/22 appreciated, had made herself DNR/DNI but on 1/23 patient discussed with PCCM and back to full code. Per PMT, patient not feeling ready for hospice on 1/22.  Plan for outpatient palliative support and if her condition should worsen then consider hospice.  [facility] aware.   Dysphagia Discussed with her regarding liquid /soft diet She would like to have regular diet so she could have more choices , regular diet ordered per patient's preference   DM II (diabetes mellitus, type II), controlled (HCC) A1c 5.4%.  On metformin at home. 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  Also has poor oral intake , improving   Leukocytosis Likely reactive, also has been on steroid, does not appear to have infection  Thrombocytosis  likely reactive  Normocytic anemia Suspect secondary to underlying malignancy -no evidence of acute blood loss --Received 2 unit PRBC during last  admission -Hgb  remains above10    Body mass index is 19.03 kg/m.  Nutritional Status Nutrition Problem: Inadequate oral intake Etiology: dysphagia, poor appetite, cancer and cancer related treatments Signs/Symptoms: meal completion < 25%, per patient/family report Interventions: Ensure Enlive (each supplement provides 350kcal and 20 grams of protein), MVI  Pressure Ulcer:     DVT prophylaxis: enoxaparin (LOVENOX) injection 30 mg Start: 07/02/21 1000     Code Status: Full Code:  Family Communication: None at bedside Disposition:  Status is: Inpatient  Remains inpatient appropriate because: Severity of illness        Consultants:   PCCM Cardiology PMT  Procedures:   As noted above  Antimicrobials:   None   Subjective:  Reports mild anterior chest pain only on deep inspiration.  Denies dyspnea.  Denies any other complaints.  Objective:   Vitals:   07/05/21 0500 07/05/21 0600 07/05/21 0700 07/05/21 0752  BP: 91/65 91/67 102/82   Pulse: 91 91 93   Resp: 19 18 (!) 29   Temp:    98.3 F (36.8 C)  TempSrc:    Oral  SpO2: 91% 91% 97%   Weight:      Height:        General exam: Elderly female, small built, frail and chronically ill looking sitting up comfortably in bed without distress. Respiratory system: Diminished breath sounds left lung fields posteriorly.  Otherwise clear to auscultation.  No wheezing, rhonchi or crackles.  No increased work of breathing.  Left-sided chest tube in place. Cardiovascular system: S1 & S2 heard, RRR. No JVD, murmurs, rubs, gallops or clicks. No pedal edema.  Telemetry personally reviewed: SR/ST in the low 100s. Gastrointestinal system: Abdomen is nondistended, soft and nontender. No organomegaly or masses felt. Normal bowel sounds heard. Central nervous system: Alert and oriented. No focal neurological deficits. Extremities: Symmetric 5 x 5 power. Skin: No rashes, lesions or ulcers Psychiatry: Judgement and insight appear normal. Mood &  affect appropriate.     Data Reviewed:   I have personally reviewed following labs and imaging studies   CBC: Recent Labs  Lab 06/30/21 1103 07/01/21 0050 07/03/21 0323 07/04/21 0037 07/05/21 0120  WBC 16.6*   < > 15.4* 17.3* 20.8*  NEUTROABS 14.1*  --   --   --   --   HGB 10.9*   < > 10.3* 10.6* 10.9*  HCT 34.9*   < > 33.8* 34.4* 35.0*  MCV 86.8   < > 87.1 86.4 85.8  PLT 686*   < > 581* 649* 545*   < > = values in this interval not displayed.    Basic Metabolic Panel: Recent Labs  Lab 07/02/21 0107 07/02/21 0254 07/03/21 0323 07/04/21 0037 07/05/21 0120  NA 133* 132* 133* 133* 134*  K 5.6* 4.7 5.4* 4.7 4.6  CL 99 98 98 95* 100  CO2 28 25 27 26 27   GLUCOSE 141* 190* 147* 97 127*  BUN 16 18 17 17 19   CREATININE 0.55 0.50 0.55 0.43* 0.42*  CALCIUM 8.8* 8.7* 8.5* 9.0 9.0  MG  --   --   --   --  1.9    Liver Function Tests: No results for input(s): AST, ALT, ALKPHOS, BILITOT, PROT, ALBUMIN in the last 168 hours.  CBG: Recent Labs  Lab 07/04/21 1553 07/04/21 2207 07/05/21 0632  GLUCAP 180* 105* 116*    Microbiology Studies:   Recent Results (from the past 240 hour(s))  Culture, blood (routine x 2)  Status: None   Collection Time: 06/30/21 11:03 AM   Specimen: BLOOD  Result Value Ref Range Status   Specimen Description   Final    BLOOD LEFT ANTECUBITAL Performed at [facility] [address], [place], [place] [address]    Special Requests   Final    Blood Culture adequate volume BOTTLES DRAWN AEROBIC AND ANAEROBIC Performed at [facility] [address], [place], [place] [address]    Culture   Final    NO GROWTH 5 DAYS Performed at [facility], [place] [address], [place], [place] [address]    Report Status 07/05/2021 FINAL  Final  Culture, blood (routine x 2)     Status: None   Collection Time: 06/30/21 11:08 AM   Specimen: BLOOD  Result Value Ref Range Status   Specimen Description   Final     BLOOD BLOOD LEFT HAND Performed at [facility] [address] [place], [place] [address]    Special Requests   Final    Blood Culture results may not be optimal due to an inadequate volume of blood received in culture bottles BOTTLES DRAWN AEROBIC AND ANAEROBIC Performed at [facility], [place] [address] [place], [place] [address]    Culture   Final    NO GROWTH 5 DAYS Performed at [facility], [place] [address] [place], [place] [address]    Report Status 07/05/2021 FINAL  Final  Resp Panel by RT-PCR (Flu A&B, Covid)     Status: None   Collection Time: 06/30/21 12:41 PM   Specimen: Nasopharyngeal(NP) swabs in vial transport medium  Result Value Ref Range Status   SARS Coronavirus 2 by RT PCR NEGATIVE NEGATIVE Final    Comment: (NOTE) SARS-CoV-2 target nucleic acids are NOT DETECTED.  The SARS-CoV-2 RNA is generally detectable in upper respiratory specimens during the acute phase of infection. The lowest concentration of SARS-CoV-2 viral copies this assay can detect is 138 copies/mL. A negative result does not preclude SARS-Cov-2 infection and should not be used as the sole basis for treatment or other patient management decisions. A negative result may occur with  improper specimen collection/handling, submission of specimen other than nasopharyngeal swab, presence of viral mutation(s) within the areas targeted by this assay, and inadequate number of viral copies(<138 copies/mL). A negative result must be combined with clinical observations, patient history, and epidemiological information. The expected result is Negative.  Fact Sheet for Patients:  EntrepreneurPulse.com.au  Fact Sheet for Healthcare Providers:  IncredibleEmployment.be  This test is no t yet approved or cleared by the [place] FDA and  has been authorized for detection and/or diagnosis of SARS-CoV-2 by FDA under an Emergency  Use Authorization (EUA). This EUA will remain  in effect (meaning this test can be used) for the duration of the COVID-19 declaration under Section 564(b)(1) of the Act, 21 U.S.C.section 360bbb-3(b)(1), unless the authorization is terminated  or revoked sooner.       Influenza A by PCR NEGATIVE NEGATIVE Final   Influenza B by PCR NEGATIVE NEGATIVE Final    Comment: (NOTE) The Xpert Xpress SARS-CoV-2/FLU/RSV plus assay is intended as an aid in the diagnosis of influenza from Nasopharyngeal swab specimens and should not be used as a sole basis for treatment. Nasal washings and aspirates are unacceptable for Xpert Xpress SARS-CoV-2/FLU/RSV testing.  Fact Sheet for Patients: EntrepreneurPulse.com.au  Fact Sheet for Healthcare Providers: IncredibleEmployment.be  This test is not yet approved or  cleared by the [place] and has been authorized for detection and/or diagnosis of SARS-CoV-2 by FDA under an Emergency Use Authorization (EUA). This EUA will remain in effect (meaning this test can be used) for the duration of the COVID-19 declaration under Section 564(b)(1) of the Act, 21 U.S.C. section 360bbb-3(b)(1), unless the authorization is terminated or revoked.  Performed at [facility], [place] [address], [place], [place] [address]   Culture, body fluid w Gram Stain-bottle     Status: None   Collection Time: 06/30/21  5:02 PM   Specimen: Fluid  Result Value Ref Range Status   Specimen Description FLUID PERICARDIAL  Final   Special Requests BOTTLES DRAWN AEROBIC AND ANAEROBIC  Final   Culture   Final    NO GROWTH 5 DAYS Performed at [facility], [place] [address] [place], [place] [address]    Report Status 07/05/2021 FINAL  Final  Gram stain     Status: None   Collection Time: 06/30/21  5:02 PM   Specimen: Fluid  Result Value Ref Range Status   Specimen Description FLUID PERICARDIAL  Final   Special Requests  NONE  Final   Gram Stain   Final    FEW WBC PRESENT, PREDOMINANTLY MONONUCLEAR NO ORGANISMS SEEN Performed at [facility], [place] [address], [place], [place] [address]    Report Status 06/30/2021 FINAL  Final  MRSA Next Gen by PCR, Nasal     Status: None   Collection Time: 06/30/21  5:55 PM   Specimen: Nasal Mucosa; Nasal Swab  Result Value Ref Range Status   MRSA by PCR Next Gen NOT DETECTED NOT DETECTED Final    Comment: (NOTE) The GeneXpert MRSA Assay (FDA approved for NASAL specimens only), is one component of a comprehensive MRSA colonization surveillance program. It is not intended to diagnose MRSA infection nor to guide or monitor treatment for MRSA infections. Test performance is not FDA approved in patients less than 14 years old. Performed at [facility], [place] [address], [place], [place] [address]     Radiology Studies:  ECHOCARDIOGRAM LIMITED  Result Date: 07/05/2021    ECHOCARDIOGRAM LIMITED REPORT   Patient Name:   Tami Stone Date of Exam: 07/05/2021 Medical Rec #:  [id number]          Height:       62.0 in Accession #:    [id number]         Weight:       104.1 lb Date of Birth:  [birth year]          BSA:          1.448 m Patient Age:    48 years           BP:           110/72 mmHg Patient Gender: F                  HR:           97 bpm. Exam Location:  Inpatient Procedure: Limited Echo, Color Doppler and Cardiac Doppler Indications:    I31.3 Pericardial effusion (noninflammatory)  History:        Patient has prior history of Echocardiogram examinations, most                 recent 07/02/2021. Pericardial Disease, Abnormal ECG,                 Arrythmias:Tachycardia; Signs/Symptoms:Syncope.  Cardiac                 tamponade. Pleural effusion. Lung and breast cancer.  Sonographer:    Roseanna Rainbow RDCS Referring Phys: [id number] [place]  Sonographer Comments: Technically difficult study due to poor echo windows. Patient supine with left pleural drain.  IMPRESSIONS  1. Abnormal septal motion . Left ventricular ejection fraction, by estimation, is 60 to 65%. The left ventricle has normal function. The left ventricle has no regional wall motion abnormalities.  2. Right ventricular systolic function is normal. The right ventricular size is normal.  3. Right atrial size was mildly dilated.  4. The pericardial effusion is RA posterior.  5. The mitral valve is normal in structure. No evidence of mitral valve regurgitation. No evidence of mitral stenosis.  6. The aortic valve is normal in structure. There is mild calcification of the aortic valve. Aortic valve regurgitation is not visualized. Aortic valve sclerosis is present, with no evidence of aortic valve stenosis.  7. The inferior vena cava is normal in size with greater than 50% respiratory variability, suggesting right atrial pressure of 3 mmHg. FINDINGS  Left Ventricle: Abnormal septal motion. Left ventricular ejection fraction, by estimation, is 60 to 65%. The left ventricle has normal function. The left ventricle has no regional wall motion abnormalities. The left ventricular internal cavity size was normal in size. There is no left ventricular hypertrophy. Right Ventricle: The right ventricular size is normal. No increase in right ventricular wall thickness. Right ventricular systolic function is normal. Left Atrium: Left atrial size was normal in size. Right Atrium: Right atrial size was mildly dilated. Pericardium: Trivial pericardial effusion is present. The pericardial effusion is RA posterior. Mitral Valve: The mitral valve is normal in structure. No evidence of mitral valve stenosis. Tricuspid Valve: The tricuspid valve is normal in structure. Tricuspid valve regurgitation is mild . No evidence of tricuspid stenosis. Aortic Valve: The aortic valve is normal in structure. There is mild calcification of the aortic valve. Aortic valve regurgitation is not visualized. Aortic valve sclerosis is present, with  no evidence of aortic valve stenosis. Pulmonic Valve: The pulmonic valve was normal in structure. Pulmonic valve regurgitation is not visualized. No evidence of pulmonic stenosis. Aorta: The aortic root is normal in size and structure. Venous: The inferior vena cava is normal in size with greater than 50% respiratory variability, suggesting right atrial pressure of 3 mmHg. IAS/Shunts: No atrial level shunt detected by color flow Doppler. LEFT VENTRICLE PLAX 2D LVIDd:         3.70 cm LVIDs:         2.60 cm LV PW:         0.90 cm LV IVS:        1.00 cm  LV Volumes (MOD) LV vol d, MOD A2C: 53.8 ml LV vol d, MOD A4C: 58.5 ml LV vol s, MOD A2C: 21.7 ml LV vol s, MOD A4C: 22.2 ml LV SV MOD A2C:     32.1 ml LV SV MOD A4C:     58.5 ml LV SV MOD BP:      36.5 ml IVC IVC diam: 1.70 cm LEFT ATRIUM         Index LA diam:    2.40 cm 1.66 cm/m   AORTA Ao Root diam: 2.90 cm Jenkins Rouge MD Electronically signed by Jenkins Rouge MD Signature Date/Time: 07/05/2021/10:19:36 AM    Final     Scheduled Meds:    alteplase (TPA)  for intrapleural administration  10 mg Intrapleural Once   And   pulmozyme (DORNASE) for intrapleural administration  5 mg Intrapleural Once   amiodarone  200 mg Oral Daily   Chlorhexidine Gluconate Cloth  6 each Topical Daily   dexamethasone  4 mg Oral Q12H   docusate sodium  100 mg Oral BID   enoxaparin (LOVENOX) injection  30 mg Subcutaneous Q24H   feeding supplement  237 mL Oral TID BM   gabapentin  300 mg Oral QHS   insulin aspart  0-9 Units Subcutaneous TID WC   lacosamide  50 mg Oral BID   letrozole  2.5 mg Oral Daily   mouth rinse  15 mL Mouth Rinse BID   multivitamin with minerals  1 tablet Oral Daily   polyethylene glycol  17 g Oral Daily   sodium chloride flush  10 mL Other Q8H   thyroid  60 mg Oral Daily   zinc sulfate  220 mg Oral Daily    Continuous Infusions:     LOS: 5 days     Vernell Leep, MD,  FACP, [facility], [facility], [facility] (Care Management Physician Certified) [place]  To contact the attending provider between 7A-7P or the covering provider during after hours 7P-7A, please log into the web site [email or web address] and access using universal [place] password for that web site. If you do not have the password, please call the hospital operator.  07/05/2021, 11:01 AM

## 2021-07-05 NOTE — Progress Notes (Signed)
NAMECalla Stone, MRN:  [id number], DOB:  [birth year], LOS: 5 ADMISSION DATE:  06/30/2021 CONSULTATION DATE:  07/01/2021 REFERRING MD:  Fletcher Anon - [facility] CHIEF COMPLAINT:  Malignant pleural effusion, PleurX catheter dysfunction   History of Present Illness:  71 year old woman who presented to [facility] 1/18 for SOB, found to have pericardial effusion with cardiac tamponade. 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 Recent admission 1/11 - 1/14 for worsening vasogenic edema in the setting of brain metastasis.  Presented to [facility] 1/18 for worsening SOB after she noted that her PleurX catheter was not draining well. Denies CP/pressure or tightness on admission. Endorses palpitations. Initial concern for PE in the setting of malignancy; CTA Chest was obtained and demonstrated worsened L pleural effusion and pericardial effusion with RV collapse concern for cardiac tamponade. Cardiology was consulted. Patient underwent emergently pericardiocentesis 1/18.  PCCM consulted 1/19 for ongoing malignant pleural effusion/PleurX catheter dysfunction.  Pertinent Medical History:   Past Medical History:  Diagnosis Date   Diabetes ([place])    Does not check bs at home   Dyspnea    High cholesterol    Lung cancer ([place])    04/2020   Significant Hospital Events: Including procedures, antibiotic start and stop dates in addition to other pertinent events   1/18 - Presented to [facility] for SOB. CTA Chest negative for PE, showed worsening pleural effusion and pericardial effusion with RV collapse/concern for cardiac tamponade. S/p pericardiocentesis 1/18. 1/19 - PCCM consulted for PleurX catheter dysfunction in the setting of malignant pleural effusion. tPA/dornase administered. 1/20 - PleurX with 61mL drained since lytic instillation. No significant improvement in  drainage. CXR with persistent complete opacification of L hemithorax.  Interim History / Subjective:   No events. Minimal chest tube output compared to previous days.  Objective:  Blood pressure 102/82, pulse 93, temperature 98.3 F (36.8 C), temperature source Oral, resp. rate (!) 29, height 5\' 2"  (1.575 m), weight 47.2 kg, SpO2 97 %.        Intake/Output Summary (Last 24 hours) at 07/05/2021 1127 Last data filed at 07/05/2021 1000 Gross per 24 hour  Intake 657 ml  Output 960 ml  Net -303 ml    Filed Weights   06/30/21 1047 07/03/21 0938 07/05/21 0400  Weight: 49.9 kg 48.1 kg 47.2 kg   Physical Examination: No distress Lungs diminished on L, no tidaling or air leak in atrium Ext warm Has muscle wasting Aox3    Resolved Hospital Problem List:    Assessment & Plan:  Tami Stone is seen in consultation at the request of [place] for further evaluation and management of malignant effusion and PleurX catheter dysfunction.  Malignant pleural effusion Malfunctioning PleurX catheter CCM consulted for PleurX catheter dysfunction. Initially placed 05/28/2021 by Dr. Valeta Harms for loculated malignant L pleural effusion. S/p thoracentesis. Path c/w adenocarcinoma. - tpa given 1/19, ~1L drainage since then - will repeat tPA/dornase today to see if can get LUL open more - if continued minimal output may need to just remove  Cardiac tamponade Malignant pericardial effusion Presented 1/18 with SOB. CTA Chest negative for PE, but demonstrated worsening pleural effusion/pericardial effusion. RV collapse noted with c/f tamponade. - S/p pericardiocentesis 1/18 and removal of pericardial drain 1/20 - Repeat echo today looks like resolution of this issue  NSCLC, adenocarcinoma with metastasis Stage 3 IDC of L breast Brain metastases c/b vasogenic edema On  Femara, Decadron, Vimpat - Continue Decadron, Vimpat - Ongoing discussion with patient/oncology re: chemotherapy - PMT  - Continue  Decadron, Vimpat - Per Oncology note last admission, patient is not interested in chemo/immunotherapy - Undecided about brain XRT - PMT consulted this admission, appreciate assistance - Patient has reversed code status back to full  Will check on tomorrow Tami Emery MD PCCM  See Tami Stone for personal pager PCCM on call pager [phone] until 7pm. Please call [place] 7p-7a. [phone]

## 2021-07-05 NOTE — Progress Notes (Signed)
°  Echocardiogram 2D Echocardiogram has been performed.  Tami Stone 07/05/2021, 10:15 AM

## 2021-07-05 NOTE — Assessment & Plan Note (Signed)
continue home thyroid Armour 60 mg daily

## 2021-07-05 NOTE — Assessment & Plan Note (Addendum)
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  Continuing Decadron and Vimpat. Ongoing discussion with patient/oncology. Per oncology note last admission, patient not interested in chemotherapy or immunotherapy. Undecided about brain XRT. PMT input 1/22 appreciated, had made herself DNR/DNI but on 1/23 patient discussed with PCCM and back to full code. Per PMT, patient not feeling ready for hospice on 1/22.  Plan for outpatient palliative support and if her condition should worsen then consider hospice.  [facility] aware. Dr. Marin Olp, oncology consult 1/24 appreciated.  He indicates prognosis of 2 to 3 months.  Patient's ongoing reluctance to cancer chemotherapy or radiation.  She confirmed with me as well. Dr. Marin Olp plans to readdress CODE STATUS.

## 2021-07-05 NOTE — Assessment & Plan Note (Signed)
Suspect secondary to underlying malignancy -no evidence of acute blood loss --Received 2 unit PRBC during last admission -Hgb remains above10

## 2021-07-05 NOTE — Assessment & Plan Note (Signed)
   likely reactive

## 2021-07-05 NOTE — Procedures (Signed)
Pleural Fibrinolytic Administration Procedure Note  Tami Stone  [id number]  1950/06/24  Date:07/05/21  Time:12:03 PM   Provider Performing:Kathryne Ramella C Tamala Julian   Procedure: Pleural Fibrinolysis Subsequent day [phone])  Indication(s) Fibrinolysis of complicated pleural effusion  Consent Risks of the procedure as well as the alternatives and risks of each were explained to the patient and/or caregiver.  Consent for the procedure was obtained.   Anesthesia None   Time Out Verified patient identification, verified procedure, site/side was marked, verified correct patient position, special equipment/implants available, medications/allergies/relevant history reviewed, required imaging and test results available.   Sterile Technique Hand hygiene, gloves   Procedure Description Existing pleural catheter was cleaned and accessed in sterile manner.  10mg  of tPA in 30cc of saline and 5mg  of dornase in 30cc of sterile water were injected into pleural space using existing pleural catheter.  Catheter will be clamped for 1 hour and then placed back to suction.   Complications/Tolerance None; patient tolerated the procedure well.  EBL None   Specimen(s) None

## 2021-07-05 NOTE — Assessment & Plan Note (Addendum)
Malignant and loculated pleural effusion Malfunctioning Pleurx catheter  PCCM was consulted. S/p tPA/dornase x1 on 1/19 without significant improvement in chest tube output. As per PCCM note 1/21 and there review of CT chest, differing additional interventions at this time.  Continuing Pleurx to drain with negative suction until patient has made further decisions regarding her care regarding hospice. Plan is to Her Pleurx drain when she is ready to leave the hospital. PCCM assisting with Pleurx management.  Have been giving tPA through Pleurx, 1/19, 1/23 and repeating tPA and dornase 1/24.  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

## 2021-07-05 NOTE — Assessment & Plan Note (Signed)
Cardiology following. Remains in [place] on telemetry. Continue amiodarone 200 Mg daily.

## 2021-07-05 NOTE — Assessment & Plan Note (Signed)
Discussed with her regarding liquid /soft diet She would like to have regular diet so she could have more choices , regular diet ordered per patient's preference

## 2021-07-05 NOTE — Assessment & Plan Note (Signed)
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

## 2021-07-05 NOTE — Assessment & Plan Note (Signed)
Likely reactive,also has been on steroid,does not appear to have infection

## 2021-07-05 NOTE — Progress Notes (Signed)
°   07/05/21 1100  Clinical Encounter Type  Visited With Patient  Visit Type Initial  Referral From Nurse  Consult/Referral To Chaplain  Advance Directives (For Healthcare)  Would patient like information on creating a medical advance directive? Yes (Inpatient - patient requests chaplain consult to create a medical advance directive)  Teller  Would patient like information on creating a mental health advance directive? Yes (Inpatient - patient requests chaplain consult to create a mental health advance directive)   Chaplain Melvenia Beam initial consultation for Advance Directive. Met patient at his bedside. Provided explanation of A.D. and instructions for completing Parts A & B. Advised patient that when he had completed Parts A & B to advise his nurse and we will arrange for witnesses and Notary to complete Part C. Provided patient a blank copy for completion. Chaplain will follow up tomorrow with patient.

## 2021-07-05 NOTE — Progress Notes (Signed)
Called to room to discuss code status. She would like trial of intubation/chest compressions/shocks should she worsen. Will update code status.  [name] MD [facility]

## 2021-07-05 NOTE — Assessment & Plan Note (Addendum)
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 Limited echo 1/20 with small effusion. Cardiology follow-up appreciated. Repeat 2D echo 1/23 shows trivial pericardial effusion.

## 2021-07-05 NOTE — Assessment & Plan Note (Signed)
A1c 5.4%.On metformin at home. Start ssi here while on steroid

## 2021-07-05 NOTE — Progress Notes (Signed)
Progress Note  Patient Name: Tami Stone Date of Encounter: 07/05/2021  Primary Cardiologist: None   Subjective   Lying on her side. Chest tube in place draining. Frail   Inpatient Medications    Scheduled Meds:  amiodarone  200 mg Oral Daily   Chlorhexidine Gluconate Cloth  6 each Topical Daily   dexamethasone  4 mg Oral Q12H   docusate sodium  100 mg Oral BID   enoxaparin (LOVENOX) injection  30 mg Subcutaneous Q24H   feeding supplement  237 mL Oral TID BM   gabapentin  300 mg Oral QHS   insulin aspart  0-9 Units Subcutaneous TID WC   lacosamide  50 mg Oral BID   letrozole  2.5 mg Oral Daily   mouth rinse  15 mL Mouth Rinse BID   multivitamin with minerals  1 tablet Oral Daily   polyethylene glycol  17 g Oral Daily   sodium chloride flush  10 mL Other Q8H   thyroid  60 mg Oral Daily   zinc sulfate  220 mg Oral Daily   Continuous Infusions:   PRN Meds: acetaminophen, albuterol, fentaNYL (SUBLIMAZE) injection, HYDROmorphone HCl, nitroGLYCERIN, ondansetron (ZOFRAN) IV   Vital Signs    Vitals:   07/05/21 0600 07/05/21 0700 07/05/21 0752 07/05/21 1130  BP: 91/67 102/82    Pulse: 91 93    Resp: 18 (!) 29    Temp:   98.3 F (36.8 C) 98.5 F (36.9 C)  TempSrc:   Oral Oral  SpO2: 91% 97%    Weight:      Height:        Intake/Output Summary (Last 24 hours) at 07/05/2021 1207 Last data filed at 07/05/2021 1100 Gross per 24 hour  Intake 647 ml  Output 1290 ml  Net -643 ml   Filed Weights   06/30/21 1047 07/03/21 0938 07/05/21 0400  Weight: 49.9 kg 48.1 kg 47.2 kg    Telemetry    Sinus rhythm,Sinus tach up to low 100s- Personally Reviewed  Physical Exam   Gen: frail Neck: No JVD Cardiac: mild tachycardia, no murmur Respiratory: nl wob, Decreased breath sounds in the bases GI: non-distended  MS: No  edema;  moves all extremities Integument: Skin feels warm Neuro:  At time of evaluation, alert and oriented to person/place/time/situation   Psych: Normal affect for situation   Labs    Chemistry Recent Labs  Lab 07/03/21 0323 07/04/21 0037 07/05/21 0120  NA 133* 133* 134*  K 5.4* 4.7 4.6  CL 98 95* 100  CO2 27 26 27   GLUCOSE 147* 97 127*  BUN 17 17 19   CREATININE 0.55 0.43* 0.42*  CALCIUM 8.5* 9.0 9.0  GFRNONAA >60 >60 >60  ANIONGAP 8 12 7      Hematology Recent Labs  Lab 07/03/21 0323 07/04/21 0037 07/05/21 0120  WBC 15.4* 17.3* 20.8*  RBC 3.88 3.98 4.08  HGB 10.3* 10.6* 10.9*  HCT 33.8* 34.4* 35.0*  MCV 87.1 86.4 85.8  MCH 26.5 26.6 26.7  MCHC 30.5 30.8 31.1  RDW 18.1* 18.2* 18.3*  PLT 581* 649* 545*    Cardiac EnzymesNo results for input(s): TROPONINI in the last 168 hours. No results for input(s): TROPIPOC in the last 168 hours.   BNP Recent Labs  Lab 06/30/21 1103  BNP 55.9     DDimer No results for input(s): DDIMER in the last 168 hours.   Radiology    ECHOCARDIOGRAM LIMITED  Result Date: 07/05/2021    ECHOCARDIOGRAM LIMITED REPORT  Patient Name:   Tami Stone Date of Exam: 07/05/2021 Medical Rec #:  [id number]          Height:       62.0 in Accession #:    [id number]         Weight:       104.1 lb Date of Birth:  [birth year]          BSA:          1.448 m Patient Age:    71 years           BP:           110/72 mmHg Patient Gender: F                  HR:           97 bpm. Exam Location:  Inpatient Procedure: Limited Echo, Color Doppler and Cardiac Doppler Indications:    I31.3 Pericardial effusion (noninflammatory)  History:        Patient has prior history of Echocardiogram examinations, most                 recent 07/02/2021. Pericardial Disease, Abnormal ECG,                 Arrythmias:Tachycardia; Signs/Symptoms:Syncope. Cardiac                 tamponade. Pleural effusion. Lung and breast cancer.  Sonographer:    Roseanna Rainbow RDCS Referring Phys: [id number] [place]  Sonographer Comments: Technically difficult study due to poor echo windows. Patient supine with left pleural  drain. IMPRESSIONS  1. Abnormal septal motion . Left ventricular ejection fraction, by estimation, is 60 to 65%. The left ventricle has normal function. The left ventricle has no regional wall motion abnormalities.  2. Right ventricular systolic function is normal. The right ventricular size is normal.  3. Right atrial size was mildly dilated.  4. The pericardial effusion is RA posterior.  5. The mitral valve is normal in structure. No evidence of mitral valve regurgitation. No evidence of mitral stenosis.  6. The aortic valve is normal in structure. There is mild calcification of the aortic valve. Aortic valve regurgitation is not visualized. Aortic valve sclerosis is present, with no evidence of aortic valve stenosis.  7. The inferior vena cava is normal in size with greater than 50% respiratory variability, suggesting right atrial pressure of 3 mmHg. FINDINGS  Left Ventricle: Abnormal septal motion. Left ventricular ejection fraction, by estimation, is 60 to 65%. The left ventricle has normal function. The left ventricle has no regional wall motion abnormalities. The left ventricular internal cavity size was normal in size. There is no left ventricular hypertrophy. Right Ventricle: The right ventricular size is normal. No increase in right ventricular wall thickness. Right ventricular systolic function is normal. Left Atrium: Left atrial size was normal in size. Right Atrium: Right atrial size was mildly dilated. Pericardium: Trivial pericardial effusion is present. The pericardial effusion is RA posterior. Mitral Valve: The mitral valve is normal in structure. No evidence of mitral valve stenosis. Tricuspid Valve: The tricuspid valve is normal in structure. Tricuspid valve regurgitation is mild . No evidence of tricuspid stenosis. Aortic Valve: The aortic valve is normal in structure. There is mild calcification of the aortic valve. Aortic valve regurgitation is not visualized. Aortic valve sclerosis is present,  with no evidence of aortic valve stenosis. Pulmonic Valve: The pulmonic valve was normal in structure. Pulmonic valve regurgitation is  not visualized. No evidence of pulmonic stenosis. Aorta: The aortic root is normal in size and structure. Venous: The inferior vena cava is normal in size with greater than 50% respiratory variability, suggesting right atrial pressure of 3 mmHg. IAS/Shunts: No atrial level shunt detected by color flow Doppler. LEFT VENTRICLE PLAX 2D LVIDd:         3.70 cm LVIDs:         2.60 cm LV PW:         0.90 cm LV IVS:        1.00 cm  LV Volumes (MOD) LV vol d, MOD A2C: 53.8 ml LV vol d, MOD A4C: 58.5 ml LV vol s, MOD A2C: 21.7 ml LV vol s, MOD A4C: 22.2 ml LV SV MOD A2C:     32.1 ml LV SV MOD A4C:     58.5 ml LV SV MOD BP:      36.5 ml IVC IVC diam: 1.70 cm LEFT ATRIUM         Index LA diam:    2.40 cm 1.66 cm/m   AORTA Ao Root diam: 2.90 cm Jenkins Rouge MD Electronically signed by Jenkins Rouge MD Signature Date/Time: 07/05/2021/10:19:36 AM    Final     Cardiac Studies   Echo 07/02/21: Normal LV function, EF 60-65%. RV normal  size and function. Small pericardial effusion along the RV free wall. No  cardiac tamponade.   Echo 07/05/2021 Trivial pericardial effusion effusion  Patient Profile     71 y.o. female stage IV cancer found to have cardiac tamponade  Assessment & Plan      Cardiac Tamponade: Status post pericardiocentesis on 06/30/2021 -Limited echo 1/20 with small effusion.  Pericardial Drain removed - resolution of effusion -  If recurring effusion will need discussion with cardiac surgery about pericardial window.  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    History of breast cance r Controlled NIDDM-2 with neuropathy  Hypothyroidism Hyponatremia:   Thrombocytosis Anemia, normocytic - Per discussion with palliative care 1/20, plan was to go home with hospice.  However patient had further discussion with palliative yesterday and decided not ready for hospice yet  Cardiology will sign off with resolution of pericardial effusion. Heart rates are stable.   For questions or updates, please contact [place] Please consult [email or web address] for contact info under Cardiology/STEMI.      Signed, Janina Mayo, MD  07/05/2021, 12:07 PM

## 2021-07-06 ENCOUNTER — Encounter: Payer: Self-pay | Admitting: *Deleted

## 2021-07-06 ENCOUNTER — Inpatient Hospital Stay (HOSPITAL_COMMUNITY): Payer: Medicare Other

## 2021-07-06 DIAGNOSIS — J90 Pleural effusion, not elsewhere classified: Secondary | ICD-10-CM | POA: Diagnosis not present

## 2021-07-06 DIAGNOSIS — C7931 Secondary malignant neoplasm of brain: Secondary | ICD-10-CM | POA: Diagnosis not present

## 2021-07-06 DIAGNOSIS — J910 Malignant pleural effusion: Secondary | ICD-10-CM | POA: Diagnosis not present

## 2021-07-06 DIAGNOSIS — C3490 Malignant neoplasm of unspecified part of unspecified bronchus or lung: Principal | ICD-10-CM

## 2021-07-06 DIAGNOSIS — Z4682 Encounter for fitting and adjustment of non-vascular catheter: Secondary | ICD-10-CM

## 2021-07-06 DIAGNOSIS — I314 Cardiac tamponade: Secondary | ICD-10-CM | POA: Diagnosis not present

## 2021-07-06 LAB — GLUCOSE, CAPILLARY
Glucose-Capillary: 106 mg/dL — ABNORMAL HIGH (ref 70–99)
Glucose-Capillary: 131 mg/dL — ABNORMAL HIGH (ref 70–99)
Glucose-Capillary: 178 mg/dL — ABNORMAL HIGH (ref 70–99)
Glucose-Capillary: 110 mg/dL — ABNORMAL HIGH (ref 70–99)

## 2021-07-06 IMAGING — DX DG CHEST 1V PORT
1 series · 1 of 1 positions shown · non-contrast
Comparison: Radiographs [DATE] and [DATE].  CT [DATE].

CLINICAL DATA: Follow up pleural effusion following chest tube
placement. History of lung cancer.

EXAM:
PORTABLE CHEST 1 VIEW

[chest]
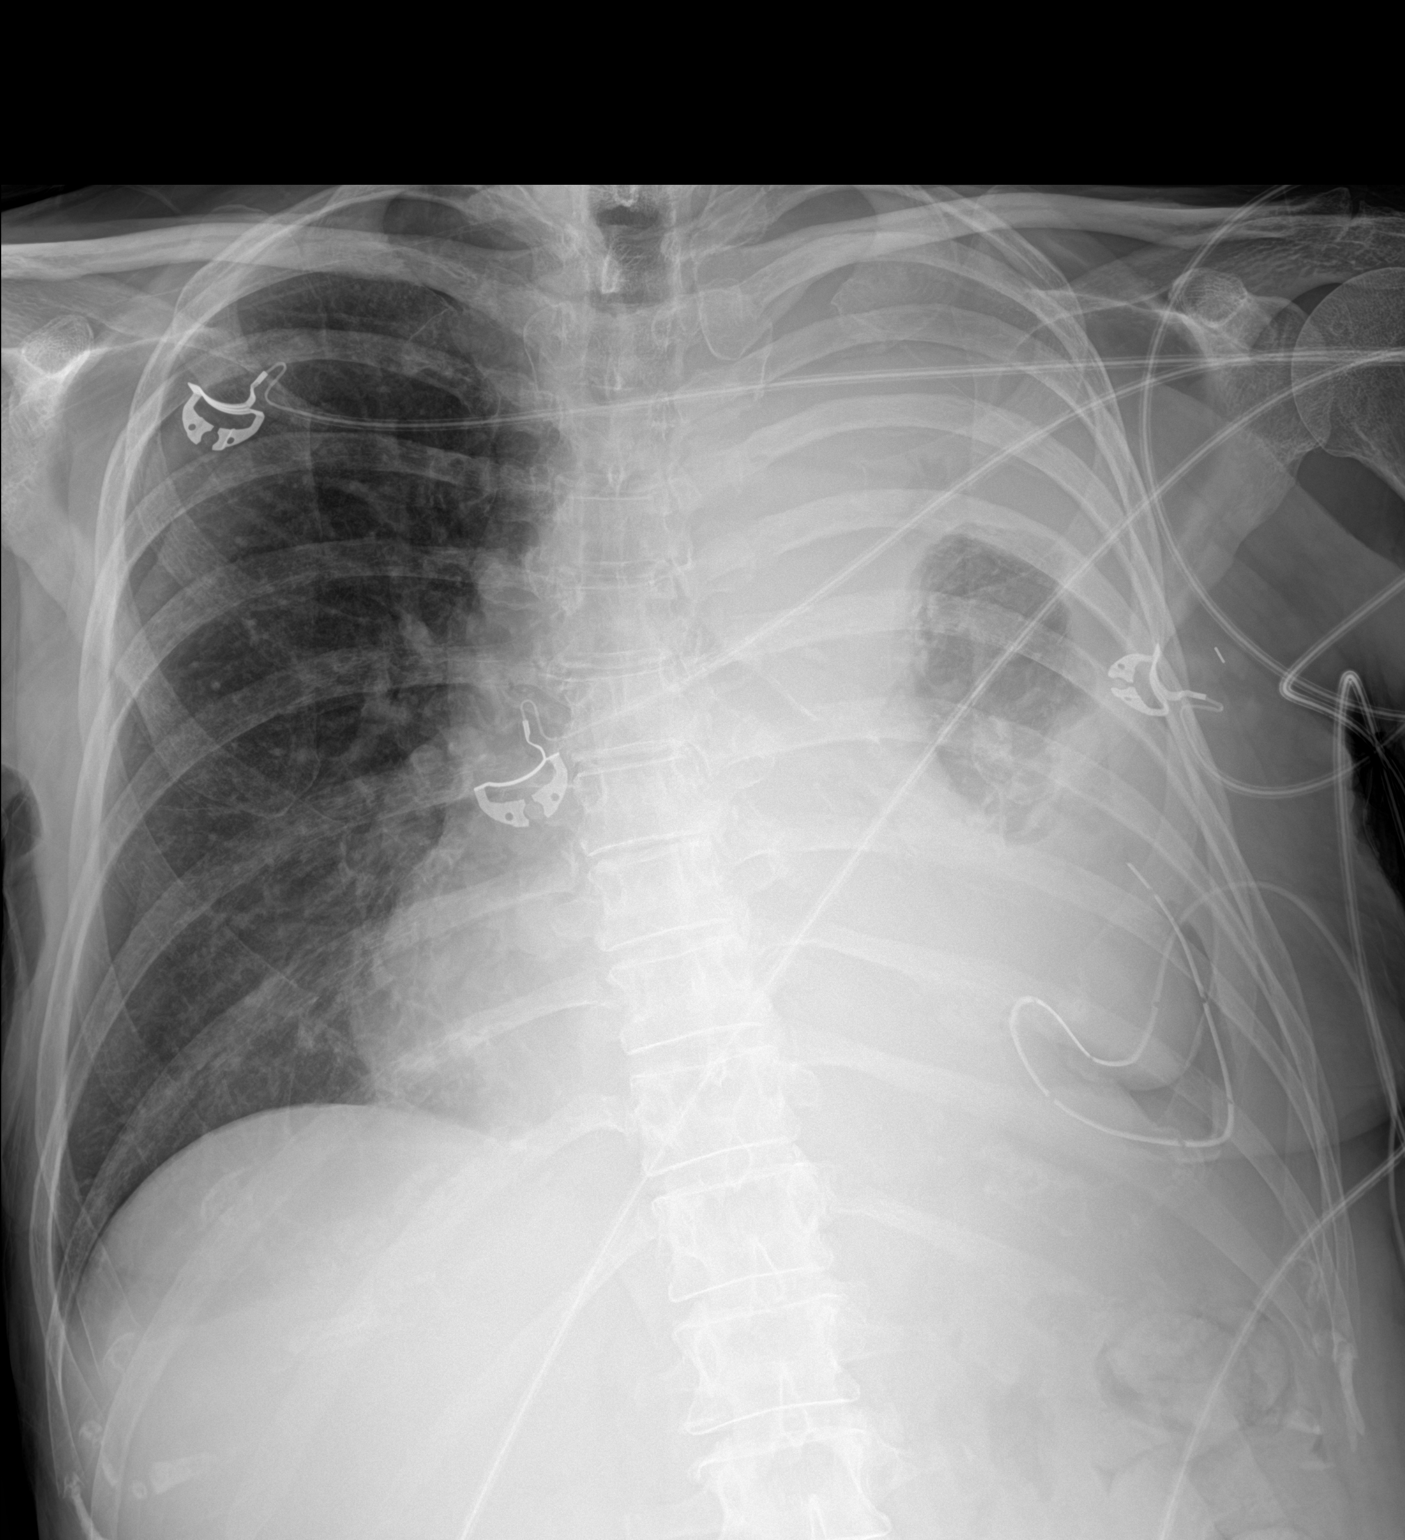

[1 of 1 positions shown; findings below may reference images not displayed]

FINDINGS: [5V] hours. Left PleurX catheter remains partially looped inferiorly
in the left hemithorax. Previously demonstrated large left pleural
effusion has minimally decreased with resulting mild increase in
left lung aeration. The visualized heart size and mediastinal
contours are stable with a large pericardial effusion identified on
recent CT. The right lung is clear. There is no evidence of
pneumothorax or significant right-sided pleural effusion. The bones
appear unchanged.
IMPRESSION: 1. Interval minimal decrease in size of left pleural effusion with
mildly increased aeration of the left lung. This effusion appears
loculated on CT.
2. Persistent enlargement of the cardiac silhouette corresponding
without large pericardial effusion on recent CT.

## 2021-07-06 MED ORDER — SODIUM CHLORIDE (PF) 0.9 % IJ SOLN
10.0000 mg | Freq: Once | INTRAMUSCULAR | Status: AC
  Administered 2021-07-06: 13:00:00 10 mg via INTRAPLEURAL
  Filled 2021-07-06: qty 10

## 2021-07-06 MED ORDER — STERILE WATER FOR INJECTION IJ SOLN
5.0000 mg | Freq: Once | RESPIRATORY_TRACT | Status: AC
  Administered 2021-07-06: 13:00:00 5 mg via INTRAPLEURAL
  Filled 2021-07-06: qty 5

## 2021-07-06 NOTE — Progress Notes (Signed)
PROGRESS NOTE   Tami Stone  VVO:[id number]    DOB: 14-May-1951    DOA: 06/30/2021  PCP: Gaynelle Cage, MD   I have briefly reviewed patients previous medical records in [facility].  Chief Complaint  Patient presents with   Shortness of Breath    Hospital Course:  71 year old female with medical history significant for breast cancer on Femara, metastatic lung cancer with mets to bone, brain, pleural, type II DM, HLD, chronic left-sided large pleural effusion, anemia, hypothyroidism, neuropathy and syncope, recent hospitalization 06/23/2021 - 06/26/2021 for worsening vasogenic cerebral edema due to worsening metastatic brain lesions noted on MRI, neuro-oncology consulted and recommended Decadron and Vimpat, oncology consulted but patient is not interested in chemotherapy or immunotherapy, radiation oncology consulted and patient was undecided about radiation therapy either, now presented with dyspnea, hypoxia and found to have large pericardial effusion with cardiac tamponade.  She underwent pericardiocentesis with placement of pericardial drain 06/30/2021 which has since been removed.  Cardiology initially admitted and then transferred to [facility] on 1/21.  Trivial pericardial effusion on repeat echo.  PCCM managing Pleurx catheter with tPA through it for improved drainage.  Oncology consulted and patient's ongoing reluctance to chemotherapy or radiation treatment.   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 Limited echo 1/20 with small  effusion. Cardiology follow-up appreciated. Repeat 2D echo 1/23 shows trivial pericardial effusion.  Atrial tachycardia [facility]) Cardiology following. Remains in [place] on telemetry. Continue amiodarone 200 Mg daily.  Recurrent left pleural effusion Malignant and loculated pleural effusion Malfunctioning Pleurx catheter  PCCM was consulted. S/p tPA/dornase x1 on 1/19 without significant improvement in chest tube output. As per PCCM note 1/21 and there review of CT chest, differing additional interventions at this time.  Continuing Pleurx to drain with negative suction until patient has made further decisions regarding her care regarding hospice. Plan is to Her Pleurx drain when she is ready to leave the hospital. PCCM assisting with Pleurx management.  Have been giving tPA through Pleurx, 1/19, 1/23 and repeating tPA and dornase 1/24.  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  Continuing Decadron and Vimpat. Ongoing discussion with patient/oncology. Per oncology note last admission, patient not interested in chemotherapy or immunotherapy. Undecided about brain XRT. PMT input 1/22 appreciated, had made herself DNR/DNI but on 1/23 patient discussed with PCCM and back to full code. Per PMT, patient not feeling ready for hospice on 1/22.  Plan for outpatient palliative support and if her condition should worsen then consider hospice.  [facility] aware. Dr. Marin Olp, oncology consult 1/24 appreciated.  He indicates prognosis of 2 to 3 months.  Patient's ongoing reluctance to cancer chemotherapy or radiation.  She confirmed with me as well. Dr. Marin Olp plans to readdress CODE STATUS.   Dysphagia Discussed with her regarding liquid /soft diet She  would like to have regular diet so she could have more choices , regular diet ordered per patient's preference   DM II (diabetes mellitus, type II), controlled ([place]) A1c 5.4%.  On metformin at home. 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  Also has poor oral intake , improving   Leukocytosis Likely reactive, also has been on steroid, does not appear to have infection  Thrombocytosis  likely reactive  Normocytic anemia Suspect secondary to underlying malignancy -no evidence of acute blood loss --Received 2 unit PRBC during last admission -Hgb remains above10    Body mass index is 19.03 kg/m.  Nutritional Status Nutrition Problem: Inadequate oral intake Etiology: dysphagia, poor appetite, cancer and cancer related treatments Signs/Symptoms: meal completion < 25%, per patient/family report Interventions: Ensure Enlive (each supplement provides 350kcal and 20 grams of protein), MVI  Pressure Ulcer:     DVT prophylaxis: enoxaparin (LOVENOX) injection 30 mg Start: 07/02/21 1000     Code Status: Full Code:  Family Communication: None at bedside Disposition:  Status is: Inpatient  Remains inpatient appropriate because: Severity of illness        Consultants:   PCCM Cardiology PMT  Procedures:   As noted above  Antimicrobials:   None   Subjective:  Denies dyspnea.  Mild anterior chest wall pain on deep inspiration.  States that she lives with her spouse and is independent of her activities at home.  Objective:   Vitals:   07/06/21 1100 07/06/21 1200 07/06/21 1209 07/06/21 1300  BP: 102/71 109/70  99/68  Pulse: 98 100  99  Resp: (!) 24 (!) 28  (!) 32  Temp:   97.9 F (36.6 C)   TempSrc:   Oral   SpO2: 96% 95%  93%  Weight:      Height:        General exam: Elderly female, small built, frail and chronically ill looking sitting up comfortably in  bed without distress. Respiratory system: Diminished breath sounds left lung fields posteriorly.  Otherwise clear to auscultation.  No wheezing, rhonchi or crackles.  No increased work of breathing.  Left-sided chest tube in place.  Stable without change. Cardiovascular system: S1 & S2 heard, RRR. No JVD, murmurs, rubs, gallops or clicks. No pedal edema.  Telemetry personally reviewed: Sinus rhythm. Gastrointestinal system: Abdomen is nondistended, soft and nontender. No organomegaly or masses felt. Normal bowel sounds heard. Central nervous system: Alert and oriented. No focal neurological deficits. Extremities: Symmetric 5 x 5 power. Skin: No rashes, lesions or ulcers Psychiatry: Judgement and insight appear normal. Mood & affect appropriate.     Data Reviewed:   I have personally reviewed following labs and imaging studies   CBC: Recent Labs  Lab 06/30/21 1103 07/01/21 0050 07/03/21 0323 07/04/21 0037 07/05/21 0120  WBC 16.6*   < > 15.4* 17.3* 20.8*  NEUTROABS 14.1*  --   --   --   --   HGB 10.9*   < > 10.3* 10.6* 10.9*  HCT 34.9*   < > 33.8* 34.4* 35.0*  MCV 86.8   < > 87.1 86.4 85.8  PLT 686*   < > 581* 649* 545*   < > = values in this interval not displayed.    Basic Metabolic Panel: Recent Labs  Lab 07/02/21 0107 07/02/21 0254 07/03/21 0323 07/04/21 0037 07/05/21 0120  NA 133* 132* 133* 133* 134*  K 5.6*  4.7 5.4* 4.7 4.6  CL 99 98 98 95* 100  CO2 28 25 27 26 27   GLUCOSE 141* 190* 147* 97 127*  BUN 16 18 17 17 19   CREATININE 0.55 0.50 0.55 0.43* 0.42*  CALCIUM 8.8* 8.7* 8.5* 9.0 9.0  MG  --   --   --   --  1.9    Liver Function Tests: No results for input(s): AST, ALT, ALKPHOS, BILITOT, PROT, ALBUMIN in the last 168 hours.  CBG: Recent Labs  Lab 07/05/21 1530 07/06/21 0645 07/06/21 1207  GLUCAP 174* 106* 131*    Microbiology Studies:   Recent Results (from the past 240 hour(s))  Culture, blood (routine x 2)     Status: None   Collection Time:  06/30/21 11:03 AM   Specimen: BLOOD  Result Value Ref Range Status   Specimen Description   Final    BLOOD LEFT ANTECUBITAL Performed at [facility] [address] [place], [place] [address]    Special Requests   Final    Blood Culture adequate volume BOTTLES DRAWN AEROBIC AND ANAEROBIC Performed at [facility] [address] [place], [place] [address]    Culture   Final    NO GROWTH 5 DAYS Performed at [facility], [place] [address] [place], [place] [address]    Report Status 07/05/2021 FINAL  Final  Culture, blood (routine x 2)     Status: None   Collection Time: 06/30/21 11:08 AM   Specimen: BLOOD  Result Value Ref Range Status   Specimen Description   Final    BLOOD BLOOD LEFT HAND Performed at [facility] [address], [place], [place] [address]    Special Requests   Final    Blood Culture results may not be optimal due to an inadequate volume of blood received in culture bottles BOTTLES DRAWN AEROBIC AND ANAEROBIC Performed at [facility], [place] [address], [place], [place] [address]    Culture   Final    NO GROWTH 5 DAYS Performed at [facility], [place] [address] [place], [place] [address]    Report Status 07/05/2021 FINAL  Final  Resp Panel by RT-PCR (Flu A&B, Covid)     Status: None   Collection Time: 06/30/21 12:41 PM   Specimen: Nasopharyngeal(NP) swabs in vial transport medium  Result Value Ref Range Status   SARS Coronavirus 2 by RT PCR NEGATIVE NEGATIVE Final    Comment: (NOTE) SARS-CoV-2 target nucleic acids are NOT DETECTED.  The SARS-CoV-2 RNA is generally detectable in upper respiratory specimens during the acute phase of infection. The lowest concentration of SARS-CoV-2 viral copies this assay can detect is 138 copies/mL. A negative result does not preclude SARS-Cov-2 infection and should not be used as the sole basis for treatment or other patient  management decisions. A negative result may occur with  improper specimen collection/handling, submission of specimen other than nasopharyngeal swab, presence of viral mutation(s) within the areas targeted by this assay, and inadequate number of viral copies(<138 copies/mL). A negative result must be combined with clinical observations, patient history, and epidemiological information. The expected result is Negative.  Fact Sheet for Patients:  EntrepreneurPulse.com.au  Fact Sheet for Healthcare Providers:  IncredibleEmployment.be  This test is no t yet approved or cleared by the [place] FDA and  has been authorized for detection and/or diagnosis of SARS-CoV-2 by FDA under an Emergency Use Authorization (EUA). This EUA will remain  in effect (meaning this test can be used) for the duration of the COVID-19 declaration under Section 564(b)(1) of the Act, 21 U.S.C.section 360bbb-3(b)(1), unless the authorization is terminated  or revoked sooner.       Influenza A by PCR NEGATIVE NEGATIVE Final   Influenza B by PCR NEGATIVE NEGATIVE Final    Comment: (NOTE) The Xpert Xpress SARS-CoV-2/FLU/RSV plus assay is intended as an aid in the diagnosis of influenza from Nasopharyngeal swab specimens and should not be used as a sole basis for treatment. Nasal washings and aspirates are unacceptable for Xpert Xpress SARS-CoV-2/FLU/RSV testing.  Fact Sheet for Patients: EntrepreneurPulse.com.au  Fact Sheet for Healthcare Providers: IncredibleEmployment.be  This test is not yet approved or cleared by the [place] FDA and has been authorized for detection and/or diagnosis of SARS-CoV-2 by FDA under an Emergency Use Authorization (EUA). This EUA will remain in effect (meaning this test can be used) for the duration of the COVID-19 declaration under Section 564(b)(1) of the Act, 21 U.S.C. section 360bbb-3(b)(1),  unless the authorization is terminated or revoked.  Performed at [facility], [place] [address] [place], [place] [address]   Culture, body fluid w Gram Stain-bottle     Status: None   Collection Time: 06/30/21  5:02 PM   Specimen: Fluid  Result Value Ref Range Status   Specimen Description FLUID PERICARDIAL  Final   Special Requests BOTTLES DRAWN AEROBIC AND ANAEROBIC  Final   Culture   Final    NO GROWTH 5 DAYS Performed at [facility], [place] [address] [place], [place] [address]    Report Status 07/05/2021 FINAL  Final  Gram stain     Status: None   Collection Time: 06/30/21  5:02 PM   Specimen: Fluid  Result Value Ref Range Status   Specimen Description FLUID PERICARDIAL  Final   Special Requests NONE  Final   Gram Stain   Final    FEW WBC PRESENT, PREDOMINANTLY MONONUCLEAR NO ORGANISMS SEEN Performed at [facility], [place] [address] [place], [place] [address]    Report Status 06/30/2021 FINAL  Final  MRSA Next Gen by PCR, Nasal     Status: None   Collection Time: 06/30/21  5:55 PM   Specimen: Nasal Mucosa; Nasal Swab  Result Value Ref Range Status   MRSA by PCR Next Gen NOT DETECTED NOT DETECTED Final    Comment: (NOTE) The GeneXpert MRSA Assay (FDA approved for NASAL specimens only), is one component of a comprehensive MRSA colonization surveillance program. It is not intended to diagnose MRSA infection nor to guide or monitor treatment for MRSA infections. Test performance is not FDA approved in patients less than 31 years old. Performed at [facility], [place] [address] [place], [place] [address]     Radiology Studies:  DG CHEST PORT 1 VIEW  Result Date: 07/06/2021 CLINICAL DATA:  Follow up pleural effusion following chest tube placement. History of lung cancer. EXAM: PORTABLE CHEST 1 VIEW COMPARISON:  Radiographs 07/02/2021 and 06/30/2021.  CT 06/30/2021. FINDINGS: 0535 hours. Left PleurX catheter remains partially looped  inferiorly in the left hemithorax. Previously demonstrated large left pleural effusion has minimally decreased with resulting mild increase in left lung aeration. The visualized heart size and mediastinal contours are stable with a large pericardial effusion identified on recent CT. The right lung is clear. There is no evidence of pneumothorax or significant right-sided pleural effusion. The bones appear unchanged. IMPRESSION: 1. Interval minimal decrease  in size of left pleural effusion with mildly increased aeration of the left lung. This effusion appears loculated on CT. 2. Persistent enlargement of the cardiac silhouette corresponding without large pericardial effusion on recent CT. Electronically Signed   By: Richardean Sale M.D.   On: 07/06/2021 08:38   ECHOCARDIOGRAM LIMITED  Result Date: 07/05/2021    ECHOCARDIOGRAM LIMITED REPORT   Patient Name:   Tami Stone Date of Exam: 07/05/2021 Medical Rec #:  [id number]          Height:       62.0 in Accession #:    [id number]         Weight:       104.1 lb Date of Birth:  1951-03-12          BSA:          1.448 m Patient Age:    77 years           BP:           110/72 mmHg Patient Gender: F                  HR:           97 bpm. Exam Location:  Inpatient Procedure: Limited Echo, Color Doppler and Cardiac Doppler Indications:    I31.3 Pericardial effusion (noninflammatory)  History:        Patient has prior history of Echocardiogram examinations, most                 recent 07/02/2021. Pericardial Disease, Abnormal ECG,                 Arrythmias:Tachycardia; Signs/Symptoms:Syncope. Cardiac                 tamponade. Pleural effusion. Lung and breast cancer.  Sonographer:    Roseanna Rainbow RDCS Referring Phys: [id number] [place]  Sonographer Comments: Technically difficult study due to poor echo windows. Patient supine with left pleural drain. IMPRESSIONS  1. Abnormal septal motion . Left ventricular ejection fraction, by estimation, is 60 to 65%. The  left ventricle has normal function. The left ventricle has no regional wall motion abnormalities.  2. Right ventricular systolic function is normal. The right ventricular size is normal.  3. Right atrial size was mildly dilated.  4. The pericardial effusion is RA posterior.  5. The mitral valve is normal in structure. No evidence of mitral valve regurgitation. No evidence of mitral stenosis.  6. The aortic valve is normal in structure. There is mild calcification of the aortic valve. Aortic valve regurgitation is not visualized. Aortic valve sclerosis is present, with no evidence of aortic valve stenosis.  7. The inferior vena cava is normal in size with greater than 50% respiratory variability, suggesting right atrial pressure of 3 mmHg. FINDINGS  Left Ventricle: Abnormal septal motion. Left ventricular ejection fraction, by estimation, is 60 to 65%. The left ventricle has normal function. The left ventricle has no regional wall motion abnormalities. The left ventricular internal cavity size was normal in size. There is no left ventricular hypertrophy. Right Ventricle: The right ventricular size is normal. No increase in right ventricular wall thickness. Right ventricular systolic function is normal. Left Atrium: Left atrial size was normal in size. Right Atrium: Right atrial size was mildly dilated. Pericardium: Trivial pericardial effusion is present. The pericardial effusion is RA posterior. Mitral Valve: The mitral valve is normal in structure. No evidence of mitral valve stenosis. Tricuspid Valve: The tricuspid  valve is normal in structure. Tricuspid valve regurgitation is mild . No evidence of tricuspid stenosis. Aortic Valve: The aortic valve is normal in structure. There is mild calcification of the aortic valve. Aortic valve regurgitation is not visualized. Aortic valve sclerosis is present, with no evidence of aortic valve stenosis. Pulmonic Valve: The pulmonic valve was normal in structure. Pulmonic  valve regurgitation is not visualized. No evidence of pulmonic stenosis. Aorta: The aortic root is normal in size and structure. Venous: The inferior vena cava is normal in size with greater than 50% respiratory variability, suggesting right atrial pressure of 3 mmHg. IAS/Shunts: No atrial level shunt detected by color flow Doppler. LEFT VENTRICLE PLAX 2D LVIDd:         3.70 cm LVIDs:         2.60 cm LV PW:         0.90 cm LV IVS:        1.00 cm  LV Volumes (MOD) LV vol d, MOD A2C: 53.8 ml LV vol d, MOD A4C: 58.5 ml LV vol s, MOD A2C: 21.7 ml LV vol s, MOD A4C: 22.2 ml LV SV MOD A2C:     32.1 ml LV SV MOD A4C:     58.5 ml LV SV MOD BP:      36.5 ml IVC IVC diam: 1.70 cm LEFT ATRIUM         Index LA diam:    2.40 cm 1.66 cm/m   AORTA Ao Root diam: 2.90 cm Jenkins Rouge MD Electronically signed by Jenkins Rouge MD Signature Date/Time: 07/05/2021/10:19:36 AM    Final     Scheduled Meds:    amiodarone  200 mg Oral Daily   Chlorhexidine Gluconate Cloth  6 each Topical Daily   dexamethasone  4 mg Oral Q12H   docusate sodium  100 mg Oral BID   enoxaparin (LOVENOX) injection  30 mg Subcutaneous Q24H   feeding supplement  237 mL Oral TID BM   gabapentin  300 mg Oral QHS   insulin aspart  0-9 Units Subcutaneous TID WC   lacosamide  50 mg Oral BID   letrozole  2.5 mg Oral Daily   mouth rinse  15 mL Mouth Rinse BID   multivitamin with minerals  1 tablet Oral Daily   polyethylene glycol  17 g Oral Daily   sodium chloride flush  10 mL Other Q8H   thyroid  60 mg Oral Daily   zinc sulfate  220 mg Oral Daily    Continuous Infusions:     LOS: 6 days     Vernell Leep, MD,  FACP, [facility], [facility], [facility] (Care Management Physician Certified) [place]  To contact the attending provider between 7A-7P or the covering provider during after hours 7P-7A, please log into the web site [email or web address] and access using universal [place] password for that web site. If you do not  have the password, please call the hospital operator.  07/06/2021, 3:11 PM

## 2021-07-06 NOTE — Consult Note (Signed)
Referral MD  Reason for Referral: Progressive non-small cell lung cancer  Chief Complaint  Patient presents with   Shortness of Breath  : I had chest pressure.  HPI: Tami Stone is well-known to me.  She is a very nice 71 year old white female.  She has metastatic adenocarcinoma of the lung.  She has elected to go with holistic therapy to date.  She has had radiosurgery for brain metastasis.  She was recently at [facility] with a syncopal episode.  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  She is supposed to have whole brain radiation therapy.  She has not yet started this.  She was discharged.  About 4 days later, she was brought to [facility].  She was found to have a large pericardial effusion.  Again this was on 06/30/2021.  She underwent a pericardiocentesis.  She had over 1600 cc of fluid removed.  Not surprising, the cytology showed metastatic adenocarcinoma.  She also was having problems with her Pleurx catheter.  She is having this opened up with enzyme therapy.  There is a possibility of her having a pericardial window placed.  I had a long talk with her this morning.  She knows that there are very few options, if any, that we have that can help with this situation.  I think the best way for [place] to go to try to help her so she does not have more complications from pleural fluid and pericardial fluid and bronchial obstruction would be radiation and chemotherapy.  Again, she has been very light into consider standard therapy.  Of note, her cancer is one of the few that have the BRAF mutation.  She had been on anti-BRAF therapy.  Unfortunately, the cancer is broken through.  She is trying to think about using Graf.  I know that this is an alternative type of therapy.  I told her that I think that if she did not wish to have any further type of standard therapy, I would not believe that her prognosis would be more than 2-3 months.  It is apparent that this  malignancy is progressing relatively aggressively right now.  Again, she does have the brain metastasis.  She needs to have whole brain radiation.  She has been reluctant to do this because of the potential for hair loss.  She is always known her body well.  Both she and her husband have been very diligent with respect to their holistic approach.  She has been very confident and comfortable with her holistic approach.  If not mistaken, I think that she has had a past DNR status.  I did not address that with her this time.  I will have to do this.  Her labs this is a looked relatively stable.  I will have to check a prealbumin on her.  Currently, I would have to say that her performance status is probably ECOG 2-3.   Past Medical History:  Diagnosis Date   Diabetes ([place])    Does not check bs at home   Dyspnea    High cholesterol    Lung cancer ([place])    04/2020  :   Past Surgical History:  Procedure Laterality Date   ABDOMINAL HYSTERECTOMY     BREAST BIOPSY Right    BRONCHIAL BIOPSY  04/30/2020   Procedure: BRONCHIAL BIOPSIES;  Surgeon: Collene Gobble, MD;  Location: [facility] ENDOSCOPY;  Service: Cardiopulmonary;;   BRONCHIAL BRUSHINGS  04/30/2020   Procedure: BRONCHIAL BRUSHINGS;  Surgeon: Collene Gobble, MD;  Location: [place];  Service: Cardiopulmonary;;   BRONCHIAL BRUSHINGS  01/20/2021   Procedure: BRONCHIAL BRUSHINGS;  Surgeon: Laurin Coder, MD;  Location: [facility] ENDOSCOPY;  Service: Endoscopy;;   BRONCHIAL WASHINGS  01/20/2021   Procedure: BRONCHIAL WASHINGS;  Surgeon: Laurin Coder, MD;  Location: [facility] ENDOSCOPY;  Service: Endoscopy;;   CHEST TUBE INSERTION Left 05/28/2021   Procedure: INSERTION PLEURAL DRAINAGE CATHETER;  Surgeon: [name], DO;  Location: [place];  Service: Pulmonary;  Laterality: Left;  Prior to bronchoscopy   HEMOSTASIS CONTROL  04/30/2020   Procedure: HEMOSTASIS CONTROL;  Surgeon: Collene Gobble, MD;  Location: [place];   Service: Cardiopulmonary;;   IR THORACENTESIS ASP PLEURAL SPACE W/IMG GUIDE  04/15/2021   IR THORACENTESIS ASP PLEURAL SPACE W/IMG GUIDE  05/03/2021   IR THORACENTESIS ASP PLEURAL SPACE W/IMG GUIDE  05/11/2021   PERICARDIOCENTESIS N/A 06/30/2021   Procedure: PERICARDIOCENTESIS;  Surgeon: Wellington Hampshire, MD;  Location: [place] CV LAB;  Service: Cardiovascular;  Laterality: N/A;   TUBAL LIGATION     VIDEO BRONCHOSCOPY N/A 04/30/2020   Procedure: VIDEO BRONCHOSCOPY WITH FLUORO;  Surgeon: Collene Gobble, MD;  Location: [facility] ENDOSCOPY;  Service: Cardiopulmonary;  Laterality: N/A;   VIDEO BRONCHOSCOPY N/A 01/20/2021   Procedure: VIDEO BRONCHOSCOPY WITHOUT FLUORO;  Surgeon: Laurin Coder, MD;  Location: [facility] ENDOSCOPY;  Service: Endoscopy;  Laterality: N/A;   VIDEO BRONCHOSCOPY Left 05/28/2021   Procedure: VIDEO BRONCHOSCOPY WITHOUT FLUORO;  Surgeon: [name], DO;  Location: [place];  Service: Pulmonary;  Laterality: Left;  :   Current Facility-Administered Medications:    acetaminophen (TYLENOL) tablet 650 mg, 650 mg, Oral, Q4H PRN, Sarajane Jews, Callie E, PA-C   albuterol (VENTOLIN HFA) 108 (90 Base) MCG/ACT inhaler 2 puff, 2 puff, Inhalation, Q2H PRN, Sande Rives E, PA-C   amiodarone (PACERONE) tablet 200 mg, 200 mg, Oral, Daily, Chandrasekhar, Mahesh A, MD, 200 mg at 07/05/21 1026   Chlorhexidine Gluconate Cloth 2 % PADS 6 each, 6 each, Topical, Daily, [name] A, MD, 6 each at 07/04/21 1506   dexamethasone (DECADRON) tablet 4 mg, 4 mg, Oral, Q12H, Sande Rives E, PA-C, 4 mg at 07/05/21 2120   docusate sodium (COLACE) capsule 100 mg, 100 mg, Oral, BID, Sande Rives E, PA-C, 100 mg at 07/05/21 2120   enoxaparin (LOVENOX) injection 30 mg, 30 mg, Subcutaneous, Q24H, Barrett, Rhonda G, PA-C, 30 mg at 07/05/21 1100   feeding supplement (ENSURE ENLIVE / ENSURE PLUS) liquid 237 mL, 237 mL, Oral, TID BM, Arida, Muhammad A, MD, 237 mL at 07/05/21 2122   fentaNYL  (SUBLIMAZE) injection 50 mcg, 50 mcg, Intravenous, Q4H PRN, [name] A, MD, 50 mcg at 07/05/21 2120   gabapentin (NEURONTIN) capsule 300 mg, 300 mg, Oral, QHS, Goodrich, Callie E, PA-C, 300 mg at 07/05/21 2120   HYDROmorphone HCl (DILAUDID) liquid 2 mg, 2 mg, Oral, Q2H PRN, Juanda Crumble, Kasie J, NP, 2 mg at 07/05/21 2347   insulin aspart (novoLOG) injection 0-9 Units, 0-9 Units, Subcutaneous, TID WC, Florencia Reasons, MD, 2 Units at 07/05/21 1639   lacosamide (VIMPAT) tablet 50 mg, 50 mg, Oral, BID, Florencia Reasons, MD, 50 mg at 07/05/21 2120   letrozole [facility]) tablet 2.5 mg, 2.5 mg, Oral, Daily, Sande Rives E, PA-C, 2.5 mg at 07/05/21 1044   MEDLINE mouth rinse, 15 mL, Mouth Rinse, BID, Florencia Reasons, MD, 15 mL at 07/05/21 2122   multivitamin with minerals tablet 1 tablet, 1 tablet, Oral,  Daily, Wellington Hampshire, MD, 1 tablet at 07/05/21 1026   nitroGLYCERIN (NITROSTAT) SL tablet 0.4 mg, 0.4 mg, Sublingual, Q5 Min x 3 PRN, Sande Rives E, PA-C   ondansetron (ZOFRAN) injection 4 mg, 4 mg, Intravenous, Q6H PRN, Darreld Mclean, PA-C, 4 mg at 07/05/21 2120   polyethylene glycol (MIRALAX / GLYCOLAX) packet 17 g, 17 g, Oral, Daily, Sarajane Jews, Callie E, PA-C   sodium chloride flush (NS) 0.9 % injection 10 mL, 10 mL, Other, Q8H, Candee Furbish, MD, 10 mL at 07/06/21 5956   thyroid (ARMOUR) tablet 60 mg, 60 mg, Oral, Daily, Sande Rives E, PA-C, 60 mg at 07/05/21 1044   zinc sulfate capsule 220 mg, 220 mg, Oral, Daily, Fletcher Anon, Muhammad A, MD, 220 mg at 07/05/21 1027:   amiodarone  200 mg Oral Daily   Chlorhexidine Gluconate Cloth  6 each Topical Daily   dexamethasone  4 mg Oral Q12H   docusate sodium  100 mg Oral BID   enoxaparin (LOVENOX) injection  30 mg Subcutaneous Q24H   feeding supplement  237 mL Oral TID BM   gabapentin  300 mg Oral QHS   insulin aspart  0-9 Units Subcutaneous TID WC   lacosamide  50 mg Oral BID   letrozole  2.5 mg Oral Daily   mouth rinse  15 mL Mouth Rinse BID    multivitamin with minerals  1 tablet Oral Daily   polyethylene glycol  17 g Oral Daily   sodium chloride flush  10 mL Other Q8H   thyroid  60 mg Oral Daily   zinc sulfate  220 mg Oral Daily  :  No Known Allergies:   Family History  Problem Relation Age of Onset   Breast cancer Sister        half sister on maternal side   Colon cancer Neg Hx    Esophageal cancer Neg Hx   :   Social History   Socioeconomic History   Marital status: Married    Spouse name: Not on file   Number of children: Not on file   Years of education: Not on file   Highest education level: Not on file  Occupational History   Not on file  Tobacco Use   Smoking status: Never   Smokeless tobacco: Never  Vaping Use   Vaping Use: Never used  Substance and Sexual Activity   Alcohol use: Not Currently   Drug use: Never   Sexual activity: Not on file  Other Topics Concern   Not on file  Social History Narrative   Not on file   Social Determinants of Health   Financial Resource Strain: Not on file  Food Insecurity: Not on file  Transportation Needs: Not on file  Physical Activity: Not on file  Stress: Not on file  Social Connections: Not on file  Intimate Partner Violence: Not on file  :  Review of Systems  Constitutional:  Positive for malaise/fatigue.  HENT: Negative.    Eyes: Negative.   Respiratory:  Positive for shortness of breath.   Cardiovascular:  Positive for chest pain and palpitations.  Gastrointestinal:  Positive for nausea.  Genitourinary: Negative.   Musculoskeletal: Negative.   Skin: Negative.   Neurological:  Positive for dizziness.  Endo/Heme/Allergies: Negative.   Psychiatric/Behavioral: Negative.      Exam: Patient Vitals for the past 24 hrs:  BP Temp Temp src Pulse Resp SpO2  07/06/21 0645 -- 97.6 F (36.4 C) Oral -- -- --  07/06/21 0545 -- -- --  95 (!) 26 92 %  07/06/21 0530 -- -- -- 89 18 93 %  07/06/21 0515 -- -- -- 89 18 93 %  07/06/21 0500 90/61 -- -- 89  19 93 %  07/06/21 0445 -- -- -- 91 19 93 %  07/06/21 0430 -- -- -- 91 18 92 %  07/06/21 0415 -- -- -- 92 20 93 %  07/06/21 0400 100/72 98.7 F (37.1 C) Oral 91 20 93 %  07/06/21 0345 -- -- -- 92 16 92 %  07/06/21 0330 -- -- -- 91 20 91 %  07/06/21 0315 -- -- -- 92 (!) 27 90 %  07/06/21 0300 99/65 -- -- 91 19 92 %  07/06/21 0245 -- -- -- 91 (!) 22 91 %  07/06/21 0230 -- -- -- 91 20 92 %  07/06/21 0215 -- -- -- 90 (!) 21 92 %  07/06/21 0200 118/84 -- -- 97 (!) 24 92 %  07/06/21 0145 -- -- -- 92 (!) 21 92 %  07/06/21 0130 -- -- -- 92 (!) 21 93 %  07/06/21 0115 -- -- -- 92 (!) 22 93 %  07/06/21 0100 95/68 -- -- 93 (!) 21 93 %  07/06/21 0045 -- -- -- 94 (!) 23 93 %  07/06/21 0030 -- -- -- 95 (!) 23 93 %  07/06/21 0015 -- -- -- 95 (!) 23 93 %  07/06/21 0000 105/71 98.6 F (37 C) Oral 95 (!) 27 (!) 89 %  07/05/21 2345 -- -- -- 96 (!) 32 92 %  07/05/21 2330 -- -- -- 99 (!) 28 92 %  07/05/21 2315 -- -- -- 100 (!) 24 91 %  07/05/21 2300 108/75 -- -- 98 (!) 31 94 %  07/05/21 2245 -- -- -- 96 (!) 27 92 %  07/05/21 2230 -- -- -- 98 (!) 23 90 %  07/05/21 2215 -- -- -- 97 (!) 23 94 %  07/05/21 2200 116/79 -- -- 96 (!) 24 91 %  07/05/21 2145 -- -- -- 97 (!) 28 93 %  07/05/21 2130 -- -- -- 95 (!) 29 92 %  07/05/21 2115 -- -- -- 94 (!) 23 96 %  07/05/21 2100 106/69 -- -- 95 (!) 32 95 %  07/05/21 2000 93/62 98.7 F (37.1 C) Oral 92 (!) 25 94 %  07/05/21 1900 101/68 -- -- 97 (!) 22 93 %  07/05/21 1532 -- 98.6 F (37 C) Oral -- -- --  07/05/21 1300 -- -- Oral -- -- --  07/05/21 1130 -- 98.5 F (36.9 C) Oral -- -- --  07/05/21 0752 -- 98.3 F (36.8 C) Oral -- -- --   Physical Exam Vitals reviewed.  HENT:     Head: Normocephalic and atraumatic.  Eyes:     Pupils: Pupils are equal, round, and reactive to light.  Cardiovascular:     Rate and Rhythm: Normal rate and regular rhythm.     Heart sounds: Normal heart sounds.  Pulmonary:     Effort: Pulmonary effort is normal.     Breath  sounds: Normal breath sounds.  Abdominal:     General: Bowel sounds are normal.     Palpations: Abdomen is soft.  Musculoskeletal:        General: No tenderness or deformity. Normal range of motion.     Cervical back: Normal range of motion.  Lymphadenopathy:     Cervical: No cervical adenopathy.  Skin:    General: Skin is warm and dry.  Findings: No erythema or rash.  Neurological:     Mental Status: She is alert and oriented to person, place, and time.  Psychiatric:        Behavior: Behavior normal.        Thought Content: Thought content normal.        Judgment: Judgment normal.    Recent Labs    07/04/21 0037 07/05/21 0120  WBC 17.3* 20.8*  HGB 10.6* 10.9*  HCT 34.4* 35.0*  PLT 649* 545*    Recent Labs    07/04/21 0037 07/05/21 0120  NA 133* 134*  K 4.7 4.6  CL 95* 100  CO2 26 27  GLUCOSE 97 127*  BUN 17 19  CREATININE 0.43* 0.42*  CALCIUM 9.0 9.0    Blood smear review: None  Pathology: none    Assessment and Plan: Ms. Jahnke is a very nice 71 year old white female.  She has metastatic adenocarcinoma of the lung.  Unfortunate, this is clearly progressing.  She is elected to go with a holistic therapy to date.  Again, I talked her about using radiation and chemotherapy to try to prevent further complications with respect to bronchial obstruction, which will happen and then she will be in a lot of trouble.  Also, I worry about her developing more pericardial fluid.  If she does need to have a pericardial window, I certainly would have no problems with this.  She is getting to the point where we may have to think about getting Hospice for her.  I told her that immunotherapy is would not an option now because it takes so long for it to work if it does work.  She really is going to think about.  Again, I will have to talk to her about her desire to be kept alive heroically.  I know in the past, she has not wish to have this done.  Tami Haw,  MD  1 Chronicles 29:11

## 2021-07-06 NOTE — TOC Progression Note (Signed)
Transition of Care [facility]) - Progression Note    Patient Details  Name: Tami Stone MRN: [id number] Date of Birth: 01-Feb-1951  Transition of [facility]) CM/SW Contact  Graves-Bigelow, Ocie Cornfield, RN Phone Number: 07/06/2021, 3:53 PM  Clinical Narrative: [place] will follow for outpatient palliative services. Previously Case Manager had arranged hospice services and patient was ordered a bedside commode and wheelchair for home. Since patient is no longer going home with hospice services-[facility] will not assist with equipment; therefore, Case Manager will follow for durable medical equipment (DME) listed above. Case Manager reached out to Attending to question if patient will transition home with PleurX drain- unsure at this time. Case Manager will follow patient closely for home health needs for nursing and DME.   Expected Discharge Plan: [place] Barriers to Discharge: Continued Medical Work up  Expected Discharge Plan and Services Expected Discharge Plan: [place] In-house Referral: Hospice / Palliative Care Discharge Planning Services: CM Consult Post Acute Care Choice: Hospice Living arrangements for the past 2 months: Single Family Home                 DME Arranged: Bedside commode, Lightweight manual wheelchair with seat cushion DME Agency: [facility] [place] (Hospice to order equipment.) Date DME Agency Contacted: 07/02/21 Time DME Agency Contacted: [id number] Representative spoke with at DME Agency: Fabio Pierce HH Arranged: RN [facility] Agency: [facility] [place] ([place]) Date [place]: 07/02/21 Time [place]: [place] Representative spoke with at [place]: [place]    Readmission Risk Interventions No flowsheet data found.

## 2021-07-06 NOTE — Progress Notes (Signed)
NAMEParticia Stone, MRN:  [id number], DOB:  [birth year], LOS: 6 ADMISSION DATE:  06/30/2021 CONSULTATION DATE:  07/01/2021 REFERRING MD:  Fletcher Anon - TRH CHIEF COMPLAINT:  Malignant pleural effusion, PleurX catheter dysfunction   History of Present Illness:  71 year old woman who presented to [facility] 1/18 for SOB, found to have pericardial effusion with cardiac tamponade. 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 Recent admission 1/11 - 1/14 for worsening vasogenic edema in the setting of brain metastasis.  Presented to [facility] 1/18 for worsening SOB after she noted that her PleurX catheter was not draining well. Denies CP/pressure or tightness on admission. Endorses palpitations. Initial concern for PE in the setting of malignancy; CTA Chest was obtained and demonstrated worsened L pleural effusion and pericardial effusion with RV collapse concern for cardiac tamponade. Cardiology was consulted. Patient underwent emergently pericardiocentesis 1/18.  PCCM consulted 1/19 for ongoing malignant pleural effusion/PleurX catheter dysfunction.  Pertinent Medical History:   Past Medical History:  Diagnosis Date   Diabetes ([place])    Does not check bs at home   Dyspnea    High cholesterol    Lung cancer ([place])    04/2020   Significant Hospital Events: Including procedures, antibiotic start and stop dates in addition to other pertinent events   1/18 - Presented to [facility] for SOB. CTA Chest negative for PE, showed worsening pleural effusion and pericardial effusion with RV collapse/concern for cardiac tamponade. S/p pericardiocentesis 1/18. 1/19 - PCCM consulted for PleurX catheter dysfunction in the setting of malignant pleural effusion. tPA/dornase administered. 1/20 - PleurX with 13mL drained since lytic instillation. No significant improvement in  drainage. CXR with persistent complete opacification of L hemithorax.  Interim History / Subjective:  Today she denies SOB. She still has some pain with coughing.   Objective:  Blood pressure 100/68, pulse 94, temperature 97.6 F (36.4 C), temperature source Oral, resp. rate (!) 25, height 5\' 2"  (1.575 m), weight 47.2 kg, SpO2 95 %.        Intake/Output Summary (Last 24 hours) at 07/06/2021 0959 Last data filed at 07/06/2021 0845 Gross per 24 hour  Intake 590 ml  Output 1840 ml  Net -1250 ml    Filed Weights   06/30/21 1047 07/03/21 0938 07/05/21 0400  Weight: 49.9 kg 48.1 kg 47.2 kg   Chest tube output 350cc   Physical Examination: General: Chronically ill-appearing woman sitting up in bed no acute distress HEENT: [place]/AT, eyes anicteric, oral mucosa moist Cardio: S1-S2, regular rate and rhythm Respiratory: Reduced on the left, Pleurx draining clear yellow fluid.  CTA on the right. Abdomen: Soft, nontender, nondistended Extremities: No edema, no cyanosis.  Minimal muscle mass. Skin: warm, dry, no rashes Neuro: awake, alert, globally weak but moving all extremities  CXR personally reviewed> small area left lateral with improved lung aeration  WBC 20.8 H/H 10.9/35 Platelets 545 Echo: LVEF 60 to 65%, abnormal septal wall motion. Normal RV size and function.  RA mildly dilated.  Posterior effusion.  IVC normal size, normal variability.  Resolved Hospital Problem List:    Assessment & Plan:  Tami Stone is seen in consultation at the request of Arjay for further evaluation and management of malignant effusion and PleurX catheter dysfunction.  Malignant pleural effusion Malfunctioning PleurX catheter CCM consulted for PleurX catheter dysfunction. Initially placed 05/28/2021 by Dr. Valeta Harms for loculated malignant L pleural effusion. S/p thoracentesis.  Path c/w adenocarcinoma. - tpa given 1/19, ~1L drainage since then.  Additional 350 cc since dose on 1/23. -Repeat tPA and  dornase today-- she has requested this afternoon. -Continue to monitor chest tube output - AM CXR - Worried that she has tumor cells occluding her tube and loculating the pleural space that may need how functional his tube is.  Cardiac tamponade Malignant pericardial effusion Presented 1/18 with SOB. CTA Chest negative for PE, but demonstrated worsening pleural effusion/pericardial effusion. RV collapse noted with c/f tamponade. - S/p pericardiocentesis 1/18 and removal of pericardial drain 1/20 -Repeat echo with minimal posterior effusion.  If this recurs she will likely need pericardial window.  NSCLC, adenocarcinoma with metastasis Stage 3 IDC of L breast Brain metastases c/b vasogenic edema On Femara, Decadron, Vimpat -Appreciate oncology's input --Continue Decadron  & vimpat -agree with recommendations for hospice; patient is not ready for that yet -Oncology notes discuss her reluctance for proceeding with chemo, immunotherapy, and the recommended whole brain radiation. Her prognosis is 2-3 months without additional treatment to slow her cancer.  - Patient has reversed code status back to full on 1/23.   Julian Hy, DO 07/06/21 12:04 PM  Pulmonary & Critical Care

## 2021-07-06 NOTE — Procedures (Signed)
Pleural Fibrinolytic Administration Procedure Note  Kianni Lheureux  [id number]  Feb 27, 1951  Date:07/06/21  Time:1:38 PM   Provider Performing:Umeka Wrench Naomie Dean   Procedure: Pleural Fibrinolysis Subsequent day (62863)  Indication(s) Fibrinolysis of complicated pleural effusion  Consent Risks of the procedure as well as the alternatives and risks of each were explained to the patient and/or caregiver.  Consent for the procedure was obtained.   Anesthesia None   Time Out Verified patient identification, verified procedure, site/side was marked, verified correct patient position, special equipment/implants available, medications/allergies/relevant history reviewed, required imaging and test results available.   Sterile Technique Hand hygiene, gloves   Procedure Description Existing pleural catheter was cleaned and accessed in sterile manner.  10mg  of tPA in 30cc of saline and 5mg  of dornase in 30cc of sterile water were injected into pleural space using existing pleural catheter.  Catheter will be clamped for 1 hour and then placed back to suction.   Complications/Tolerance None; patient tolerated the procedure well.   EBL None   Specimen(s) None  Julian Hy, DO 07/06/21 1:38 PM  Pulmonary & Critical Care

## 2021-07-06 NOTE — Progress Notes (Signed)
Patient hospitalized. She continues to decline and is reluctant to receive chemo and/or radiation. She continues to investigate holistic approaches. She is no longer a candidate for immunotherapy. Will discontinue active navigation however will be available to the patient as needed.   Oncology Nurse Navigator Documentation  Oncology Nurse Navigator Flowsheets 07/06/2021  Abnormal Finding Date -  Confirmed Diagnosis Date -  Diagnosis Status -  Navigator Follow Up Date: -  Navigator Follow Up Reason: -  Navigation Complete Date: 07/06/2021  Post Navigation: Continue to Follow Patient? No  Reason Not Navigating Patient: No Treatment, Observation Only  Navigator Location [facility]  Referral Date to RadOnc/MedOnc -  Navigator Encounter Type Appt/Treatment Plan Review  Telephone -  Multidisiplinary Clinic Date -  Multidisiplinary Clinic Type -  Patient Visit Type MedOnc  Treatment Phase Active Tx  Barriers/Navigation Needs Coordination of Care;Education  Education -  Interventions None Required  Acuity Level 2-Minimal Needs (1-2 Barriers Identified)  Coordination of Care -  Education Method -  Support Groups/Services Friends and Family  Time Spent with Patient 15

## 2021-07-06 NOTE — Plan of Care (Signed)
°  Problem: Clinical Measurements: °Goal: Respiratory complications will improve °Outcome: Progressing °Goal: Cardiovascular complication will be avoided °Outcome: Progressing °  °Problem: Elimination: °Goal: Will not experience complications related to bowel motility °Outcome: Progressing °Goal: Will not experience complications related to urinary retention °Outcome: Progressing °  °Problem: Pain Managment: °Goal: General experience of comfort will improve °Outcome: Progressing °  °

## 2021-07-07 DIAGNOSIS — I314 Cardiac tamponade: Secondary | ICD-10-CM | POA: Diagnosis not present

## 2021-07-07 LAB — BASIC METABOLIC PANEL
Anion gap: 9 (ref 5–15)
BUN: 15 mg/dL (ref 8–23)
CO2: 26 mmol/L (ref 22–32)
Calcium: 8.7 mg/dL — ABNORMAL LOW (ref 8.9–10.3)
Chloride: 91 mmol/L — ABNORMAL LOW (ref 98–111)
Creatinine, Ser: 0.38 mg/dL — ABNORMAL LOW (ref 0.44–1.00)
GFR, Estimated: >60 mL/min (ref >60)
Glucose, Bld: 98 mg/dL (ref 70–99)
Potassium: 4.7 mmol/L (ref 3.5–5.1)
Sodium: 126 mmol/L — ABNORMAL LOW (ref 135–145)

## 2021-07-07 LAB — GLUCOSE, CAPILLARY
Glucose-Capillary: 100 mg/dL — ABNORMAL HIGH (ref 70–99)
Glucose-Capillary: 222 mg/dL — ABNORMAL HIGH (ref 70–99)
Glucose-Capillary: 97 mg/dL (ref 70–99)

## 2021-07-07 LAB — CBC
HCT: 36.1 % (ref 36.0–46.0)
Hemoglobin: 11.3 g/dL — ABNORMAL LOW (ref 12.0–15.0)
MCH: 26.8 pg (ref 26.0–34.0)
MCHC: 31.3 g/dL (ref 30.0–36.0)
MCV: 85.7 fL (ref 80.0–100.0)
Platelets: 512 10*3/uL — ABNORMAL HIGH (ref 150–400)
RBC: 4.21 MIL/uL (ref 3.87–5.11)
RDW: 18.4 % — ABNORMAL HIGH (ref 11.5–15.5)
WBC: 22.7 10*3/uL — ABNORMAL HIGH (ref 4.0–10.5)
nRBC: 0 % (ref 0.0–0.2)

## 2021-07-07 MED ORDER — PEG 3350-KCL-NA BICARB-NACL 420 G PO SOLR
4000.0000 mL | Freq: Once | ORAL | Status: DC
Start: 2021-07-07 — End: 2021-07-07

## 2021-07-07 MED ORDER — POLYETHYLENE GLYCOL 3350 17 G PO PACK: 17.0000 g | PACK | ORAL | Status: DC | PRN

## 2021-07-07 MED ORDER — WITCH HAZEL-GLYCERIN EX PADS
MEDICATED_PAD | CUTANEOUS | Status: DC | PRN
  Filled 2021-07-07: qty 100

## 2021-07-07 MED ORDER — SORBITOL 70 % SOLN
30.0000 mL | Freq: Once | Status: AC
  Administered 2021-07-07: 15:00:00 30 mL via ORAL
  Filled 2021-07-07: qty 30

## 2021-07-07 MED ORDER — LACTULOSE 10 GM/15ML PO SOLN
20.0000 g | Freq: Two times a day (BID) | ORAL | Status: DC
  Administered 2021-07-07 – 2021-07-08 (×3): 20 g via ORAL
  Filled 2021-07-07 (×3): qty 30

## 2021-07-07 MED ORDER — POLYETHYLENE GLYCOL 3350 17 G PO PACK
17.0000 g | PACK | Freq: Every day | ORAL | Status: DC
  Administered 2021-07-07: 12:00:00 17 g via ORAL

## 2021-07-07 NOTE — Progress Notes (Signed)
Overall, Tami Stone is about the same.  She had a chest x-ray yesterday.  Still shows a left pleural effusion.  She had some cardiomegaly.  She had an echocardiogram done on the 23rd which showed a trivial pericardial effusion.  I did talk to her about treatment options.  Again, I think the only treatment option that we would have that would be effective would be systemic chemotherapy.  It be nice to use chemotherapy with radiation therapy.  However, this might be a little bit too much for her.  She is still going to think about this.  Also talked to her about her desire to be kept alive on machines.  She does not want to be kept on a breathing machine.  I told her that if CPR was initiated, she would not do well.  I would be worried that the bones will be broken and that she would bleed internally.  I told her that if she were to go on a breathing machine, she would never come off just because she would be too weak.  She has decided that she does not want to be kept alive heroically.  I totally agree with this.  As such, I will make her a DO NOT RESUSCITATE status.  I reassured her that we will still be aggressive with trying to keep her quality of life as good as possible.  She is constipated.  She is having some hemorrhoidal issues.  I will try some lactulose on her to see how this might help.  Her labs show sodium 126.  Potassium 4.7.  Her BUN is 15 creatinine 0.38.  Her calcium is 8.7.  Her white cell count is 22.7.  The hemoglobin 11.3 and platelet count 512,000.    Her prealbumin is only 8.4.  I think this is clearly an ominous prognostic factor for her.  I am not sure that this can really be improved.  Again, I think this is incredibly prognostic.  I just want her quality of life to be as good as possible.  I respect the fact that she has always wished to do holistic approaches for her treatments.  I understand this.  She knows her body well.  I will continue to talk with her about  her status and her wishes.  Again, the fact that her prealbumin is still low is definitely going to "drive" her prognosis.    Tami Haw, MD  Colossians 3:23

## 2021-07-07 NOTE — Progress Notes (Signed)
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, brain, pleural, type II DM, HLD, chronic left-sided large pleural effusion, anemia, hypothyroidism, neuropathy and syncope, recent hospitalization 06/23/2021 - 06/26/2021 for worsening vasogenic cerebral edema due to worsening metastatic brain lesions noted on MRI, neuro-oncology consulted and recommended Decadron and Vimpat, oncology consulted but patient is not interested in chemotherapy or immunotherapy, radiation oncology consulted and patient was undecided about radiation therapy either, now presented with dyspnea, hypoxia and found to have large pericardial effusion with cardiac tamponade.  She underwent pericardiocentesis with placement of pericardial drain 06/30/2021 which has since been removed.  Cardiology initially admitted and then transferred to [facility] on 1/21.  Trivial pericardial effusion on repeat echo.  PCCM managing Pleurx catheter with tPA through it for improved drainage.  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 Limited echo 1/20 with small effusion. Cardiology follow-up appreciated. Repeat 2D echo 1/23 shows trivial pericardial effusion.   Atrial tachycardia [facility]) Cardiology following. Remains in [place] on telemetry. Continue amiodarone 200 Mg daily.   Recurrent left pleural effusion Malignant and loculated pleural effusion Malfunctioning Pleurx catheter PCCM was consulted -appreciate insight and recommendations Chest tube continues to put out approximately 300 cc daily, status post tPA/dornase  multiple times without much change in output  Pleurx ongoing to drain with negative suction until patient has made further decisions regarding her care regarding hospice. Plan is to continue Pleurx drain when she is ready to leave the hospital. PCCM assisting with Pleurx management.  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  Continuing Decadron and Vimpat. Ongoing discussion with patient/oncology - can be transitioned to an outpatient venue given improving symptoms. Per oncology note last admission, patient not interested in chemotherapy or immunotherapy. Undecided about brain XRT. Code status labile but DNR as of 07/07/21 am. Conservatively 3-6 month life expectancy per oncology.   Dysphagia Discussed with her regarding liquid /soft diet -regular diet ongoing per patient's choice   DM II (diabetes mellitus, type II), controlled ([place]) A1c 5.4%.  On metformin at home. 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  Also has poor oral intake , improving    Leukocytosis Likely reactive, also has been on steroid, does not appear to have infection  Thrombocytosis Likely reactive, improving   Normocytic anemia Suspect secondary to underlying malignancy -No evidence of acute blood loss -Received 2 unit PRBC during last admission -Hgb remains above10   Moderate protein caloric malnutrition Nutrition Problem: Inadequate oral intake Etiology: dysphagia, poor appetite, cancer and cancer related treatments Signs/Symptoms: meal completion < 25%, per patient/family report Interventions: Ensure Enlive (each supplement provides 350kcal and 20 grams of protein), MVI   DVT prophylaxis: lovenox Code Status: DNR as of 1/25 per Dr  Marin Olp discussion Family Communication: None present  Status is: Inpatient  Dispo: The patient is from: Home  Anticipated d/c is to: Home              Anticipated d/c date is: 24 to 48 hours              Patient currently not medically stable for discharge  Consultants:  Oncology, PCCM  Antimicrobials:  None indicated  Subjective: No acute issues or events overnight denies nausea vomiting diarrhea constipation any fevers chills or chest pain, ongoing discomfort at Pleurx tube insertion site  Objective: Vitals:   07/07/21 0600 07/07/21 0647 07/07/21 0655 07/07/21 0700  BP: 101/71   94/60  Pulse: 99  95 94  Resp: (!) 28  (!) 25 (!) 34  Temp:   98.2 F (36.8 C)   TempSrc:   Oral   SpO2: 95%  90% 92%  Weight:  43.6 kg    Height:        Intake/Output Summary (Last 24 hours) at 07/07/2021 0727 Last data filed at 07/07/2021 0647 Gross per 24 hour  Intake 720 ml  Output 1045 ml  Net -325 ml   Filed Weights   07/03/21 0938 07/05/21 0400 07/07/21 0647  Weight: 48.1 kg 47.2 kg 43.6 kg    Examination:  General: Cachectic appearing, pleasantly resting in bed, No acute distress. HEENT:  Normocephalic atraumatic.  Sclerae nonicteric, noninjected.  Extraocular movements intact bilaterally. Neck:  Without mass or deformity.  Trachea is midline. Lungs: Left-sided chest tube/Pleurx catheter clean dry intact draining clear amber fluid. Heart:  Regular rate and rhythm.  Without murmurs, rubs, or gallops. Abdomen:  Soft, nontender, nondistended.  Without guarding or rebound. Extremities: Without cyanosis, clubbing, edema, or obvious deformity. Vascular:  Dorsalis pedis and posterior tibial pulses palpable bilaterally. Skin:  Warm and dry, no erythema, no ulcerations.   Data Reviewed: I have personally reviewed following labs and imaging studies  CBC: Recent Labs  Lab 06/30/21 1103 07/01/21 0050 07/02/21 0107 07/03/21 0323 07/04/21 0037 07/05/21 0120  07/07/21 0444  WBC 16.6*   < > 16.8* 15.4* 17.3* 20.8* 22.7*  NEUTROABS 14.1*  --   --   --   --   --   --   HGB 10.9*   < > 10.9* 10.3* 10.6* 10.9* 11.3*  HCT 34.9*   < > 35.0* 33.8* 34.4* 35.0* 36.1  MCV 86.8   < > 86.2 87.1 86.4 85.8 85.7  PLT 686*   < > 596* 581* 649* 545* 512*   < > = values in this interval not displayed.   Basic Metabolic Panel: Recent Labs  Lab 07/02/21 0254 07/03/21 0323 07/04/21 0037 07/05/21 0120 07/07/21 0444  NA 132* 133* 133* 134* 126*  K 4.7 5.4* 4.7 4.6 4.7  CL 98 98 95* 100 91*  CO2 25 27 26 27 26   GLUCOSE 190* 147* 97 127* 98  BUN 18 17 17 19 15   CREATININE 0.50 0.55 0.43* 0.42* 0.38*  CALCIUM 8.7* 8.5* 9.0 9.0 8.7*  MG  --   --   --  1.9  --    GFR: Estimated Creatinine Clearance: 44.4 mL/min (A) (by C-G formula based on SCr of 0.38 mg/dL (L)). Liver Function Tests: No results for input(s): AST, ALT, ALKPHOS, BILITOT, PROT, ALBUMIN in the last 168 hours. No results for input(s): LIPASE, AMYLASE in the last 168 hours. No results for input(s): AMMONIA in the last 168 hours. Coagulation Profile: No results for input(s): INR, PROTIME in the last 168 hours. Cardiac Enzymes: No results for input(s): CKTOTAL, CKMB, CKMBINDEX, TROPONINI in  the last 168 hours. BNP (last 3 results) No results for input(s): PROBNP in the last 8760 hours. HbA1C: No results for input(s): HGBA1C in the last 72 hours. CBG: Recent Labs  Lab 07/06/21 0645 07/06/21 1207 07/06/21 1616 07/06/21 2249 07/07/21 0655  GLUCAP 106* 131* 178* 110* 100*   Lipid Profile: No results for input(s): CHOL, HDL, LDLCALC, TRIG, CHOLHDL, LDLDIRECT in the last 72 hours. Thyroid Function Tests: No results for input(s): TSH, T4TOTAL, FREET4, T3FREE, THYROIDAB in the last 72 hours. Anemia Panel: No results for input(s): VITAMINB12, FOLATE, FERRITIN, TIBC, IRON, RETICCTPCT in the last 72 hours. Sepsis Labs: Recent Labs  Lab 06/30/21 1103 07/01/21 0050  PROCALCITON 0.28  --    LATICACIDVEN 2.4* 1.3    Recent Results (from the past 240 hour(s))  Culture, blood (routine x 2)     Status: None   Collection Time: 06/30/21 11:03 AM   Specimen: BLOOD  Result Value Ref Range Status   Specimen Description   Final    BLOOD LEFT ANTECUBITAL Performed at [facility] [address], [place], [place] [address]    Special Requests   Final    Blood Culture adequate volume BOTTLES DRAWN AEROBIC AND ANAEROBIC Performed at [facility] [address], [place], [place] [address]    Culture   Final    NO GROWTH 5 DAYS Performed at [facility], [place] [address], [place], [place] [address]    Report Status 07/05/2021 FINAL  Final  Culture, blood (routine x 2)     Status: None   Collection Time: 06/30/21 11:08 AM   Specimen: BLOOD  Result Value Ref Range Status   Specimen Description   Final    BLOOD BLOOD LEFT HAND Performed at [facility] [address], [place], [place] [address]    Special Requests   Final    Blood Culture results may not be optimal due to an inadequate volume of blood received in culture bottles BOTTLES DRAWN AEROBIC AND ANAEROBIC Performed at [facility], [place] [address], [place], [place] [address]    Culture   Final    NO GROWTH 5 DAYS Performed at [facility], [place] [address], [place], [place] [address]    Report Status 07/05/2021 FINAL  Final  Resp Panel by RT-PCR (Flu A&B, Covid)     Status: None   Collection Time: 06/30/21 12:41 PM   Specimen: Nasopharyngeal(NP) swabs in vial transport medium  Result Value Ref Range Status   SARS Coronavirus 2 by RT PCR NEGATIVE NEGATIVE Final    Comment: (NOTE) SARS-CoV-2 target nucleic acids are NOT DETECTED.  The SARS-CoV-2 RNA is generally detectable in upper respiratory specimens during the acute phase of infection. The lowest concentration of SARS-CoV-2 viral copies this assay can detect is 138 copies/mL.  A negative result does not preclude SARS-Cov-2 infection and should not be used as the sole basis for treatment or other patient management decisions. A negative result may occur with  improper specimen collection/handling, submission of specimen other than nasopharyngeal swab, presence of viral mutation(s) within the areas targeted by this assay, and inadequate number of viral copies(<138 copies/mL). A negative result must be combined with clinical observations, patient history, and epidemiological information. The expected result is Negative.  Fact Sheet for Patients:  EntrepreneurPulse.com.au  Fact Sheet for Healthcare Providers:  IncredibleEmployment.be  This test is no t yet approved or cleared by the [place] FDA and  has  been authorized for detection and/or diagnosis of SARS-CoV-2 by FDA under an Emergency Use Authorization (EUA). This EUA will remain  in effect (meaning this test can be used) for the duration of the COVID-19 declaration under Section 564(b)(1) of the Act, 21 U.S.C.section 360bbb-3(b)(1), unless the authorization is terminated  or revoked sooner.       Influenza A by PCR NEGATIVE NEGATIVE Final   Influenza B by PCR NEGATIVE NEGATIVE Final    Comment: (NOTE) The Xpert Xpress SARS-CoV-2/FLU/RSV plus assay is intended as an aid in the diagnosis of influenza from Nasopharyngeal swab specimens and should not be used as a sole basis for treatment. Nasal washings and aspirates are unacceptable for Xpert Xpress SARS-CoV-2/FLU/RSV testing.  Fact Sheet for Patients: EntrepreneurPulse.com.au  Fact Sheet for Healthcare Providers: IncredibleEmployment.be  This test is not yet approved or cleared by the [place] FDA and has been authorized for detection and/or diagnosis of SARS-CoV-2 by FDA under an Emergency Use Authorization (EUA). This EUA will remain in effect (meaning this test  can be used) for the duration of the COVID-19 declaration under Section 564(b)(1) of the Act, 21 U.S.C. section 360bbb-3(b)(1), unless the authorization is terminated or revoked.  Performed at [facility], [place] [address], [place], [place] [address]   Culture, body fluid w Gram Stain-bottle     Status: None   Collection Time: 06/30/21  5:02 PM   Specimen: Fluid  Result Value Ref Range Status   Specimen Description FLUID PERICARDIAL  Final   Special Requests BOTTLES DRAWN AEROBIC AND ANAEROBIC  Final   Culture   Final    NO GROWTH 5 DAYS Performed at [facility], [place] [address] [place], [place] [address]    Report Status 07/05/2021 FINAL  Final  Gram stain     Status: None   Collection Time: 06/30/21  5:02 PM   Specimen: Fluid  Result Value Ref Range Status   Specimen Description FLUID PERICARDIAL  Final   Special Requests NONE  Final   Gram Stain   Final    FEW WBC PRESENT, PREDOMINANTLY MONONUCLEAR NO ORGANISMS SEEN Performed at [facility], [place] [address], [place], [place] [address]    Report Status 06/30/2021 FINAL  Final  MRSA Next Gen by PCR, Nasal     Status: None   Collection Time: 06/30/21  5:55 PM   Specimen: Nasal Mucosa; Nasal Swab  Result Value Ref Range Status   MRSA by PCR Next Gen NOT DETECTED NOT DETECTED Final    Comment: (NOTE) The GeneXpert MRSA Assay (FDA approved for NASAL specimens only), is one component of a comprehensive MRSA colonization surveillance program. It is not intended to diagnose MRSA infection nor to guide or monitor treatment for MRSA infections. Test performance is not FDA approved in patients less than 38 years old. Performed at [facility], [place] [address] [place], [place] [address]          Radiology Studies: DG CHEST PORT 1 VIEW  Result Date: 07/06/2021 CLINICAL DATA:  Follow up pleural effusion following chest tube placement. History of lung cancer. EXAM: PORTABLE CHEST 1 VIEW  COMPARISON:  Radiographs 07/02/2021 and 06/30/2021.  CT 06/30/2021. FINDINGS: 0535 hours. Left PleurX catheter remains partially looped inferiorly in the left hemithorax. Previously demonstrated large left pleural effusion has minimally decreased with resulting mild increase in left lung aeration. The visualized heart size and mediastinal contours are stable with a large pericardial effusion identified on recent CT.  The right lung is clear. There is no evidence of pneumothorax or significant right-sided pleural effusion. The bones appear unchanged. IMPRESSION: 1. Interval minimal decrease in size of left pleural effusion with mildly increased aeration of the left lung. This effusion appears loculated on CT. 2. Persistent enlargement of the cardiac silhouette corresponding without large pericardial effusion on recent CT. Electronically Signed   By: Richardean Sale M.D.   On: 07/06/2021 08:38   ECHOCARDIOGRAM LIMITED  Result Date: 07/05/2021    ECHOCARDIOGRAM LIMITED REPORT   Patient Name:   Tami Stone Date of Exam: 07/05/2021 Medical Rec #:  [id number]          Height:       62.0 in Accession #:    [id number]         Weight:       104.1 lb Date of Birth:  [birth year]          BSA:          1.448 m Patient Age:    36 years           BP:           110/72 mmHg Patient Gender: F                  HR:           97 bpm. Exam Location:  Inpatient Procedure: Limited Echo, Color Doppler and Cardiac Doppler Indications:    I31.3 Pericardial effusion (noninflammatory)  History:        Patient has prior history of Echocardiogram examinations, most                 recent 07/02/2021. Pericardial Disease, Abnormal ECG,                 Arrythmias:Tachycardia; Signs/Symptoms:Syncope. Cardiac                 tamponade. Pleural effusion. Lung and breast cancer.  Sonographer:    Roseanna Rainbow RDCS Referring Phys: [id number] [place]  Sonographer Comments: Technically difficult study due to poor echo windows. Patient supine  with left pleural drain. IMPRESSIONS  1. Abnormal septal motion . Left ventricular ejection fraction, by estimation, is 60 to 65%. The left ventricle has normal function. The left ventricle has no regional wall motion abnormalities.  2. Right ventricular systolic function is normal. The right ventricular size is normal.  3. Right atrial size was mildly dilated.  4. The pericardial effusion is RA posterior.  5. The mitral valve is normal in structure. No evidence of mitral valve regurgitation. No evidence of mitral stenosis.  6. The aortic valve is normal in structure. There is mild calcification of the aortic valve. Aortic valve regurgitation is not visualized. Aortic valve sclerosis is present, with no evidence of aortic valve stenosis.  7. The inferior vena cava is normal in size with greater than 50% respiratory variability, suggesting right atrial pressure of 3 mmHg. FINDINGS  Left Ventricle: Abnormal septal motion. Left ventricular ejection fraction, by estimation, is 60 to 65%. The left ventricle has normal function. The left ventricle has no regional wall motion abnormalities. The left ventricular internal cavity size was normal in size. There is no left ventricular hypertrophy. Right Ventricle: The right ventricular size is normal. No increase in right ventricular wall thickness. Right ventricular systolic function is normal. Left Atrium: Left atrial size was normal in size. Right Atrium: Right atrial size was mildly dilated. Pericardium: Trivial pericardial effusion is present.  The pericardial effusion is RA posterior. Mitral Valve: The mitral valve is normal in structure. No evidence of mitral valve stenosis. Tricuspid Valve: The tricuspid valve is normal in structure. Tricuspid valve regurgitation is mild . No evidence of tricuspid stenosis. Aortic Valve: The aortic valve is normal in structure. There is mild calcification of the aortic valve. Aortic valve regurgitation is not visualized. Aortic valve  sclerosis is present, with no evidence of aortic valve stenosis. Pulmonic Valve: The pulmonic valve was normal in structure. Pulmonic valve regurgitation is not visualized. No evidence of pulmonic stenosis. Aorta: The aortic root is normal in size and structure. Venous: The inferior vena cava is normal in size with greater than 50% respiratory variability, suggesting right atrial pressure of 3 mmHg. IAS/Shunts: No atrial level shunt detected by color flow Doppler. LEFT VENTRICLE PLAX 2D LVIDd:         3.70 cm LVIDs:         2.60 cm LV PW:         0.90 cm LV IVS:        1.00 cm  LV Volumes (MOD) LV vol d, MOD A2C: 53.8 ml LV vol d, MOD A4C: 58.5 ml LV vol s, MOD A2C: 21.7 ml LV vol s, MOD A4C: 22.2 ml LV SV MOD A2C:     32.1 ml LV SV MOD A4C:     58.5 ml LV SV MOD BP:      36.5 ml IVC IVC diam: 1.70 cm LEFT ATRIUM         Index LA diam:    2.40 cm 1.66 cm/m   AORTA Ao Root diam: 2.90 cm Jenkins Rouge MD Electronically signed by Jenkins Rouge MD Signature Date/Time: 07/05/2021/10:19:36 AM    Final         Scheduled Meds:  amiodarone  200 mg Oral Daily   Chlorhexidine Gluconate Cloth  6 each Topical Daily   dexamethasone  4 mg Oral Q12H   docusate sodium  100 mg Oral BID   enoxaparin (LOVENOX) injection  30 mg Subcutaneous Q24H   feeding supplement  237 mL Oral TID BM   gabapentin  300 mg Oral QHS   insulin aspart  0-9 Units Subcutaneous TID WC   lacosamide  50 mg Oral BID   lactulose  20 g Oral BID   letrozole  2.5 mg Oral Daily   mouth rinse  15 mL Mouth Rinse BID   multivitamin with minerals  1 tablet Oral Daily   polyethylene glycol  17 g Oral Daily   sodium chloride flush  10 mL Other Q8H   thyroid  60 mg Oral Daily   zinc sulfate  220 mg Oral Daily   Continuous Infusions:   LOS: 7 days   Time spent: 42min  Tania Steinhauser C Vernella Niznik, DO [facility]  If 7PM-7AM, please contact night-coverage [email or web address]  07/07/2021, 7:27 AM

## 2021-07-07 NOTE — Progress Notes (Signed)
° °  NAMEJolanda Stone, MRN:  [id number], DOB:  23-Jan-1951, LOS: 7 ADMISSION DATE:  06/30/2021 CONSULTATION DATE:  07/01/2021 REFERRING MD:  Fletcher Anon - TR[facility] CHIEF COMPLAINT:  Malignant pleural effusion, PleurX catheter dysfunction   History of Present Illness:  71 year old woman who presented to [facility] 1/18 for SOB, found to have pericardial effusion with cardiac tamponade. 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 Recent admission 1/11 - 1/14 for worsening vasogenic edema in the setting of brain metastasis.  Presented to [facility] 1/18 for worsening SOB after she noted that her PleurX catheter was not draining well. Denies CP/pressure or tightness on admission. Endorses palpitations. Initial concern for PE in the setting of malignancy; CTA Chest was obtained and demonstrated worsened L pleural effusion and pericardial effusion with RV collapse concern for cardiac tamponade. Cardiology was consulted. Patient underwent emergently pericardiocentesis 1/18.  PCCM consulted 1/19 for ongoing malignant pleural effusion/PleurX catheter dysfunction.  Pertinent Medical History:   Past Medical History:  Diagnosis Date   Diabetes ([place])    Does not check bs at home   Dyspnea    High cholesterol    Lung cancer ([place])    04/2020   Significant Hospital Events: Including procedures, antibiotic start and stop dates in addition to other pertinent events   1/18 - Presented to [facility] for SOB. CTA Chest negative for PE, showed worsening pleural effusion and pericardial effusion with RV collapse/concern for cardiac tamponade. S/p pericardiocentesis 1/18. 1/19 - PCCM consulted for PleurX catheter dysfunction in the setting of malignant pleural effusion. tPA/dornase administered. 1/20 - PleurX with 69mL drained since lytic instillation. No significant improvement in  drainage. CXR with persistent complete opacification of L hemithorax.  Interim History / Subjective:  No events, still putting out about 300 with instillation of pleural lytics; however, no change in breathing with this drainage.  Objective:  Blood pressure 98/67, pulse (!) 101, temperature 98.2 F (36.8 C), temperature source Oral, resp. rate (!) 29, height 5\' 2"  (1.575 m), weight 43.6 kg, SpO2 95 %.        Intake/Output Summary (Last 24 hours) at 07/07/2021 1037 Last data filed at 07/07/2021 8768 Gross per 24 hour  Intake 480 ml  Output 805 ml  Net -325 ml    Filed Weights   07/03/21 0938 07/05/21 0400 07/07/21 0647  Weight: 48.1 kg 47.2 kg 43.6 kg   Chest tube output 350cc   Physical Examination: No distress Chest tube atrium with serous fluid, no tidaling or airleak No edema Abdomen soft, +BS +muscle wasting  Resolved Hospital Problem List:    Assessment & Plan:  [name] is seen in consultation at the request of [place] for further evaluation and management of malignant effusion and PleurX catheter dysfunction.  Malignant pleural effusion Malfunctioning PleurX catheter - Minimal effect to lytic attempt.  Will see how she does with outpatient drainage and consider removal in office if continues to be poorly functional; will arrange f/u with Dr. Valeta Harms next week; instructed patient to try to drain pleurX at least once over weekend - Stable for discharge from our [place], MD 07/07/21 10:37 AM [facility]

## 2021-07-08 ENCOUNTER — Telehealth: Payer: Self-pay

## 2021-07-08 DIAGNOSIS — I314 Cardiac tamponade: Secondary | ICD-10-CM | POA: Diagnosis not present

## 2021-07-08 DIAGNOSIS — Z515 Encounter for palliative care: Secondary | ICD-10-CM

## 2021-07-08 LAB — GLUCOSE, CAPILLARY
Glucose-Capillary: 113 mg/dL — ABNORMAL HIGH (ref 70–99)
Glucose-Capillary: 112 mg/dL — ABNORMAL HIGH (ref 70–99)

## 2021-07-08 MED ORDER — ACETAMINOPHEN 325 MG PO TABS: 650.0000 mg | ORAL_TABLET | ORAL | 0 refills | Status: AC | PRN

## 2021-07-08 MED ORDER — HYDROMORPHONE HCL 1 MG/ML PO LIQD: 1.0000 mg | ORAL | 0 refills | Status: AC | PRN

## 2021-07-08 MED ORDER — LACTULOSE 10 GM/15ML PO SOLN: 20.0000 g | Freq: Two times a day (BID) | ORAL | 0 refills | Status: DC

## 2021-07-08 MED ORDER — AMIODARONE HCL 200 MG PO TABS: 200.0000 mg | ORAL_TABLET | Freq: Every day | ORAL | 0 refills | Status: DC

## 2021-07-08 MED ORDER — GLUCERNA SHAKE PO LIQD: 237.0000 mL | Freq: Three times a day (TID) | ORAL | Status: DC

## 2021-07-08 NOTE — TOC Transition Note (Signed)
Transition of Care [facility]) - CM/SW Discharge Note   Patient Details  Name: Tami Stone MRN: [id number] Date of Birth: [birth year]  [facility] of Care [facility]) CM/SW Contact:  Bethena Roys, RN Phone Number: 07/08/2021, 12:23 PM   Clinical Narrative:  Case Manager spoke with patient and spouse regarding home health services. Patient was previously active with [place] [facility] RN Services. [facility] is willing to accept the patient for services- RN for PleurX and PT,OT. Husband states he has been educated regarding PleurX drain. Husband states he has 8-9 PleurX canisters in the home. Case Manager did not order any new canisters for the patient. Durable medical equipment (DME) bedside commode and light weight wheelchair ordered for the patient via [employer]. [employer] is attempting to get co pays for the DME before delivery to the room. Husband to transport patient home via private vehicle. No further needs identified by Case Manager at this time.    Final next level of care: [place] Barriers to Discharge: Continued Medical Work up   Patient Goals and CMS Choice Patient states their goals for this hospitalization and ongoing recovery are:: to return home with Palliative Services. CMS Medicare.gov Compare Post Acute Care list provided to:: Patient Choice offered to / list presented to : Patient  Discharge Plan and Services In-house Referral: Hospice / Palliative Care Discharge Planning Services: CM Consult Post Acute Care Choice: Hospice          DME Arranged: Bedside commode, Lightweight manual wheelchair with seat cushion DME Agency: [employer] Date DME Agency Contacted: 07/08/21 Time DME Agency Contacted: [id number] Representative spoke with at DME Agency: Freda Munro HH Arranged: RN, Disease Management, PT, OT [place] Agency: [place] Date [place]: 07/08/21 Time [place]: 1222 Representative spoke with at [place]: Amy     Readmission  Risk Interventions No flowsheet data found.

## 2021-07-08 NOTE — Telephone Encounter (Signed)
-----   Message from Candee Furbish, MD sent at 07/07/2021 10:49 AM EST ----- Regarding: Is this right pool? Wanted to get this lady scheduled for f/u appt with Dr. Valeta Harms in 7-10 days

## 2021-07-08 NOTE — Evaluation (Addendum)
Physical Therapy Evaluation Patient Details Name: Tami Stone MRN: [id number] DOB: January 21, 1951 Today's Date: 07/08/2021  History of Present Illness  71 year old woman who presented to [facility] 1/18 for SOB, found to have pericardial effusion with cardiac tamponade. 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 Recent admission 1/11 - 1/14 for worsening vasogenic edema in the setting of brain metastasis.  Clinical Impression  Pt admitted with above diagnosis. Pt was able to ambulate with gait belt and assist with RW and feels that husband can assist at home. Will need follow up and equipment as below.  Pt currently with functional limitations due to the deficits listed below (see PT Problem List). Pt will benefit from skilled PT to increase their independence and safety with mobility to allow discharge to the venue listed below.       Orthostatic BPs  Supine 98/68, 94 bpm  Sitting 97/66, 94 bpm  Standing 104/69, 96 bpm  Standing after 3 min 113/75, 94 bpm     BP at end of session once in chair 96/67 and HR 104 bpm.  Recommendations for follow up therapy are one component of a multi-disciplinary discharge planning process, led by the attending physician.  Recommendations may be updated based on patient status, additional functional criteria and insurance authorization.  Follow Up Recommendations Home health PT    Assistance Recommended at Discharge Intermittent Supervision/Assistance  Patient can return home with the following  A lot of help with walking and/or transfers;A little help with bathing/dressing/bathroom    Equipment Recommendations Rolling walker (2 wheels);BSC/3in1;Wheelchair (measurements PT);Wheelchair cushion (measurements PT)  Recommendations for Other Services       Functional Status Assessment Patient has had a  recent decline in their functional status and demonstrates the ability to make significant improvements in function in a reasonable and predictable amount of time.     Precautions / Restrictions Precautions Precautions: Fall Restrictions Weight Bearing Restrictions: No      Mobility  Bed Mobility Overal bed mobility: Needs Assistance Bed Mobility: Supine to Sit     Supine to sit: Min assist     General bed mobility comments: Needed assist to come to eOB    Transfers Overall transfer level: Needs assistance Equipment used: Rolling walker (2 wheels) Transfers: Sit to/from Stand Sit to Stand: Min assist, Mod assist           General transfer comment: Needed steadying assist to power up and once up.    Ambulation/Gait Ambulation/Gait assistance: Min assist, +2 safety/equipment Gait Distance (Feet): 30 Feet Assistive device: Rolling walker (2 wheels) Gait Pattern/deviations: Step-through pattern, Decreased step length - right, Decreased stance time - right, Decreased weight shift to right, Knee flexed in stance - right, Knee flexed in stance - left, Trunk flexed, Narrow base of support   Gait velocity interpretation: <1.31 ft/sec, indicative of household ambulator   General Gait Details: Pt was able to progres ambulation however leans to right at times as well as pt appears to be weaker on the right side with flexed bil knees (no buckling noted).  Pt was able to progress ambulation a short distance however needed a chair follow and use of gait belt with min assist and cues for safety. Needs cues to sequence steps and RW.  Stairs            Wheelchair Mobility    Modified Rankin (Stroke Patients Only)  Balance Overall balance assessment: Needs assistance Sitting-balance support: No upper extremity supported, Feet supported Sitting balance-Leahy Scale: Fair     Standing balance support: Bilateral upper extremity supported, During functional  activity Standing balance-Leahy Scale: Poor Standing balance comment: relies on UE support for balance                             Pertinent Vitals/Pain Pain Assessment Pain Assessment: Faces Faces Pain Scale: Hurts even more Pain Location: generalized Pain Descriptors / Indicators: Aching, Grimacing, Guarding Pain Intervention(s): Limited activity within patient's tolerance, Monitored during session, Repositioned    Home Living Family/patient expects to be discharged to:: Private residence Living Arrangements: Spouse/significant other Available Help at Discharge: Family;Available 24 hours/day Type of Home:  (daughter 73 years old TBI since 25 that they are caregivers for) Home Access: Level entry       Home Layout: One level Home Equipment: Transport chair;Shower seat      Prior Function               Mobility Comments: States she was walking up until recently when husband had to use transport chair with her. ADLs Comments: No assist needed until recently     Hand Dominance        Extremity/Trunk Assessment   Upper Extremity Assessment Upper Extremity Assessment: Defer to OT evaluation    Lower Extremity Assessment Lower Extremity Assessment: RLE deficits/detail;LLE deficits/detail RLE Deficits / Details: grossly 3/5 LLE Deficits / Details: grossly 3-/5    Cervical / Trunk Assessment Cervical / Trunk Assessment: Kyphotic  Communication   Communication: No difficulties  Cognition Arousal/Alertness: Awake/alert Behavior During Therapy: WFL for tasks assessed/performed Overall Cognitive Status: Within Functional Limits for tasks assessed                                          General Comments      Exercises General Exercises - Lower Extremity Ankle Circles/Pumps: AROM, Both, 10 reps, Seated Long Arc Quad: AROM, Both, 10 reps, Seated   Assessment/Plan    PT Assessment Patient needs continued PT services  PT Problem  List Decreased activity tolerance;Decreased balance;Decreased mobility;Decreased knowledge of use of DME;Decreased safety awareness;Decreased knowledge of precautions;Decreased strength;Decreased range of motion       PT Treatment Interventions DME instruction;Gait training;Functional mobility training;Therapeutic activities;Therapeutic exercise;Balance training;Patient/family education    PT Goals (Current goals can be found in the Care Plan section)  Acute Rehab PT Goals Patient Stated Goal: to go home today PT Goal Formulation: With patient Time For Goal Achievement: 07/22/21 Potential to Achieve Goals: Good    Frequency Min 3X/week     Co-evaluation               AM-PAC PT "6 Clicks" Mobility  Outcome Measure Help needed turning from your back to your side while in a flat bed without using bedrails?: A Little Help needed moving from lying on your back to sitting on the side of a flat bed without using bedrails?: A Little Help needed moving to and from a bed to a chair (including a wheelchair)?: A Lot Help needed standing up from a chair using your arms (e.g., wheelchair or bedside chair)?: A Lot Help needed to walk in hospital room?: A Little Help needed climbing 3-5 steps with a railing? : A Lot 6 Click Score: 15  End of Session Equipment Utilized During Treatment: Gait belt Activity Tolerance: Patient limited by fatigue Patient left: in chair;with call bell/phone within reach;with chair alarm set Nurse Communication: Mobility status PT Visit Diagnosis: Unsteadiness on feet (R26.81);Muscle weakness (generalized) (M62.81)    Time: 7183-6725 PT Time Calculation (min) (ACUTE ONLY): 34 min   Charges:   PT Evaluation $PT Eval Moderate Complexity: 1 Mod PT Treatments $Gait Training: 8-22 mins        Shary Lamos M,PT Acute Rehab Services [phone] (pager)   Alvira Philips 07/08/2021, 1:17 PM

## 2021-07-08 NOTE — Telephone Encounter (Signed)
Next Appt With Pulmonology (Gilbert, DO) 07/23/2021 at 12:00 PM

## 2021-07-08 NOTE — Discharge Summary (Signed)
Physician Discharge Summary  Kristyna Bradstreet RJJ:[id number] DOB: August 09, 1950 DOA: 06/30/2021  PCP: Gaynelle Cage, MD  Admit date: 06/30/2021 Discharge date: 07/08/2021  Admitted From: Home Disposition:  Home  Recommendations for Outpatient Follow-up:  Follow up with PCP in 1-2 weeks Please obtain BMP/CBC in one week Please follow up with pulmonology and oncology as scheduled in the next two weeks.  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, brain, pleural, type II DM, HLD, chronic left-sided large pleural effusion, anemia, hypothyroidism, neuropathy and syncope, recent hospitalization 06/23/2021 - 06/26/2021 for worsening vasogenic cerebral edema due to worsening metastatic brain lesions noted on MRI, neuro-oncology consulted and recommended Decadron and Vimpat, oncology consulted but patient is not interested in chemotherapy or immunotherapy, radiation oncology consulted and patient was undecided about radiation therapy either, now presented with dyspnea, hypoxia and found to have large pericardial effusion with cardiac tamponade.  She underwent pericardiocentesis with placement of pericardial drain 06/30/2021 which has since been removed.  Cardiology initially admitted and then transferred to [facility] on 1/21.  Trivial pericardial effusion on repeat echo.  PCCM managing Pleurx catheter with tPA through it for improved drainage.  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  Continuing Decadron and Vimpat. Ongoing discussion with palliative/oncology in the outpatient setting DNR at discharge   Dysphagia Advance diet as tolerated   DM II (diabetes mellitus, type II), controlled ([place]) A1c 5.4%. continue home regimen   Hypothyroid continue [place] thyroid   Hyponatremia Hypovolemic - improving    Leukocytosis Likely reactive, also has been on steroid, does not appear to have infection   Thrombocytosis Likely reactive, improving   Normocytic anemia Suspect secondary to underlying malignancy No evidence of acute blood loss   Moderate protein caloric malnutrition Nutrition Problem: Inadequate oral intake Etiology: dysphagia, poor appetite, cancer and cancer related treatments Signs/Symptoms: meal completion < 25%, per patient/family report Interventions: Ensure Enlive (each supplement provides 350kcal and 20 grams of protein), MVI  Discharge Instructions  Discharge Instructions     Discharge patient   Complete by: As directed    Discharge disposition: 01-Home or Self Care   Discharge patient date: 07/08/2021      Allergies as of 07/08/2021   No Known Allergies      Medication List     STOP taking these medications    ibuprofen 200 MG tablet Commonly known as: ADVIL   metFORMIN 500 MG tablet Commonly known as: Glucophage       TAKE these medications    acetaminophen 325 MG tablet Commonly known as:  TYLENOL Take 2 tablets (650 mg total) by mouth every 4 (four)  hours as needed for mild pain, moderate pain, fever or headache.   amiodarone 200 MG tablet Commonly known as: PACERONE Take 1 tablet (200 mg total) by mouth daily. Start taking on: July 09, 2021   Apricot Flavor Powd Take 2 capsules by mouth daily. Apricot Seed Supplement   CO Q 10 PO Take 300 mg by mouth at bedtime.   DANDELION PO Take 1 tablet by mouth daily with supper.   dexamethasone 4 MG tablet Commonly known as: DECADRON Take 1 tablet (4 mg total) by mouth every 12 (twelve) hours.   docusate sodium 100 MG capsule Commonly known as: COLACE Take 1 capsule (100 mg total) by mouth 2 (two) times daily.   gabapentin 300 MG capsule Commonly known as: NEURONTIN Take 1 capsule (300 mg total) by mouth at bedtime.   HYDROmorphone HCl 1 MG/ML Liqd Commonly known as: DILAUDID Take 1 mL (1 mg total) by mouth every 4 (four) hours as needed for up to 5 days for severe pain (or dyspnea).   IODINE (KELP) PO Take 1 capsule by mouth in the morning.   lacosamide 50 MG Tabs tablet Commonly known as: VIMPAT Take 1 tablet (50 mg total) by mouth 2 (two) times daily.   lactulose 10 GM/15ML solution Commonly known as: CHRONULAC Take 30 mLs (20 g total) by mouth 2 (two) times daily.   letrozole 2.5 MG tablet Commonly known as: FEMARA Take 1 tablet (2.5 mg total) by mouth daily.   multivitamin-prenatal 27-0.8 MG Tabs tablet Take 1 tablet by mouth daily at 12 noon.   NP Thyroid 60 MG tablet Generic drug: thyroid Take 60 mg by mouth daily.   polyethylene glycol 17 g packet Commonly known as: MIRALAX / GLYCOLAX Take 17 g by mouth daily.   TURMERIC PLUS BLACK PEPPER EXT PO Take 1 capsule by mouth in the morning, at noon, and at bedtime.   Vitamin D 125 MCG (5000 UT) Caps Take 10,000 Units by mouth every other day.   Zinc 100 MG Tabs Take 100 mg by mouth daily.               Durable Medical Equipment   (From admission, onward)           Start     Ordered   07/08/21 1310  For home use only DME Walker rolling  Once       Question Answer Comment  Walker: With [place]   Patient needs a walker to treat with the following condition General weakness      07/08/21 1310   07/08/21 1109  For home use only DME Bedside commode  Once       Question:  Patient needs a bedside commode to treat with the following condition  Answer:  General weakness   07/08/21 1111   07/08/21 1109  For home use only DME lightweight manual wheelchair with seat cushion  Once       Comments: Patient suffers from shortness of breath 2/2 pericardial effusion with cardiac tamponadewhich impairs their ability to perform daily activities like ambulating, dressing, and grooming in the home.  A rolling walker will not resolve  issue with performing activities of daily living. A wheelchair will allow patient to safely perform daily activities. Patient is not able to propel themselves in the home using a standard weight wheelchair due to general weakness. Patient can self propel in the lightweight wheelchair. Length of need lifetime.  Accessories: elevating leg rests (ELRs), wheel locks, extensions  and anti-tippers.   07/08/21 1111            Follow-up Information     [facility] Follow up.   Why: Registered Nurse- [place] at home-office to call and schedule a visit time. Contact information: [place] [address] [place], [place] [facility] Follow up.   Why: Bedside Commode, Rolling Walker, and wheelchair will be delivered to the room prior to transition home. Contact information: [place] [address] [phone]         [facility] Follow up.   Specialty: Home Health Services Why: Now [facility]- office ti call with visit times. Contact information: [place] [address] [phone]                 No Known Allergies  Consultations: Oncology, PCCM, Cardiology   Procedures/Studies: DG Chest 2 View  Result Date: 06/30/2021 CLINICAL DATA:  Short of breath and weakness.  Lung cancer. EXAM: CHEST - 2 VIEW COMPARISON:  06/23/2021 FINDINGS: Left basilar chest tube in place. No pneumothorax. Near complete opacification left chest due to effusion and tumor and collapse left lung. Small amount of aerated lung on the left. Heart is displaced to the right. Possible pericardial effusion. Minimal right pleural effusion.  Right lung otherwise clear. IMPRESSION: Progressive opacification left chest due to fluid, tumor, and collapse. Left chest tube in place. No pneumothorax Heart displaced to the right.  Question pericardial effusion Electronically Signed   By: Franchot Gallo M.D.   On: 06/30/2021 11:22   DG Chest 2 View  Result Date: 06/23/2021 CLINICAL DATA:  New onset leg weakness and difficulty walking. EXAM: CHEST - 2 VIEW COMPARISON:  Chest x-ray dated May 28, 2021. FINDINGS: Unchanged left-sided PleurX catheter. Stable appearance of the chest with large loculated left pleural effusion and near complete collapse of the left lung. Large left lower lobe mass is obscured. The right lung remains clear. Stable cardiomediastinal silhouette with rightward mediastinal shift. No pneumothorax. No acute osseous abnormality. IMPRESSION: 1. Stable large loculated left pleural effusion and near complete collapse of the left lung. Electronically Signed   By: Titus Dubin M.D.   On: 06/23/2021 16:20   CT HEAD WO CONTRAST (5MM)  Result Date: 06/23/2021 CLINICAL DATA:  Weakness.  History of metastatic lung cancer. EXAM: CT HEAD WITHOUT CONTRAST TECHNIQUE: Contiguous axial images were obtained from the base of the skull through the vertex without intravenous contrast. RADIATION DOSE REDUCTION: This exam was performed according to the departmental dose-optimization program which includes automated exposure  control, adjustment of the mA and/or kV according to patient size and/or use of iterative reconstruction technique. COMPARISON:  01/28/2021 and brain MR 05/21/2021 FINDINGS: Brain: Again noted is vasogenic edema in the left parietal lobe and this may have progressed since the MRI on 05/21/2021. There is new vasogenic edema in the right parietooccipital region and concerning for a new underlying lesion. There is also new low-density in the right frontal lobe on series 2, image 12. No evidence for acute hemorrhage. Ventricles are stable and there is no significant midline shift. Vascular: No hyperdense vessel or unexpected calcification. Skull: Normal. Negative for fracture or focal lesion. Sinuses/Orbits: Visualized sinuses are clear. Other: None IMPRESSION: 1. Patient has known intracranial metastatic disease and there are new areas of vasogenic edema in the right frontal lobe and right parietal-occipital region. In addition, the edema in the left parietal lobe may  have increased. Findings are suggestive for progression of intracranial metastatic disease. This could be better evaluated with MRI. 2. No evidence for acute hemorrhage. Electronically Signed   By: Markus Daft M.D.   On: 06/23/2021 16:31   CT Angio Chest PE W and/or Wo Contrast  Result Date: 06/30/2021 CLINICAL DATA:  Episode of shortness of breath. Known stage IV lung cancer. EXAM: CT ANGIOGRAPHY CHEST WITH CONTRAST TECHNIQUE: Multidetector CT imaging of the chest was performed using the standard protocol during bolus administration of intravenous contrast. Multiplanar CT image reconstructions and MIPs were obtained to evaluate the vascular anatomy. RADIATION DOSE REDUCTION: This exam was performed according to the departmental dose-optimization program which includes automated exposure control, adjustment of the mA and/or kV according to patient size and/or use of iterative reconstruction technique. CONTRAST:  84mL OMNIPAQUE IOHEXOL 350 MG/ML SOLN  COMPARISON:  05/05/2021 FINDINGS: Cardiovascular: New very large pericardial effusion. Recommend cardiology consultation. The heart is normal in size. Stable thoracic aorta. No dissection or focal aneurysm. The pulmonary arterial tree is well opacified. No filling defects to suggest pulmonary embolism. The left pulmonary artery branches are severely compressed because of the large left lower lobe lung mass, obstructed left lung and large complex left pleural effusion. Mediastinum/Nodes: Progressive subcarinal adenopathy. Lungs/Pleura: Complete left lower lobe atelectasis with obstruction of the left lower lobe bronchus. There is a PleurX drainage catheter in place but there is a persistent large loculated left pleural fluid collection and significant atelectasis of most of the lung. Small right pleural effusion with overlying atelectasis. No right-sided pulmonary lesions or pulmonary nodules. Upper Abdomen: No significant upper abdominal findings. Musculoskeletal: No breast masses, supraclavicular or axillary adenopathy. The bony thorax is grossly intact. Stable sclerotic lesion involving the L1 vertebral body. Review of the MIP images confirms the above findings. IMPRESSION: 1. No CT findings for pulmonary embolism. 2. New very large pericardial effusion. Recommend cardiology consultation. 3. Complete left lower lobe atelectasis with obstruction of the left lower lobe bronchus. 4. PleurX drainage catheter in place but there is a persistent large loculated left pleural fluid collection and significant atelectasis of most of the lung. 5. Progressive subcarinal adenopathy. 6. Small right pleural effusion with overlying atelectasis. 7. Stable sclerotic lesion involving the L1 vertebral body. 8. Aortic atherosclerosis. Aortic Atherosclerosis (ICD10-I70.0). Electronically Signed   By: Marijo Sanes M.D.   On: 06/30/2021 14:18   MR Brain W and Wo Contrast  Result Date: 06/23/2021 CLINICAL DATA:  Initial evaluation for  CNS neoplasm, assess treatment response. EXAM: MRI HEAD WITHOUT AND WITH CONTRAST TECHNIQUE: Multiplanar, multiecho pulse sequences of the brain and surrounding structures were obtained without and with intravenous contrast. CONTRAST:  5.61mL GADAVIST GADOBUTROL 1 MMOL/ML IV SOLN COMPARISON:  Head CT from earlier the same day as well as previous brain MRI from 05/21/2021. FINDINGS: Brain: Cerebral volume stable, and remains within normal limits. Underlying mild chronic microvascular ischemic disease noted, also unchanged. No evidence for acute or subacute infarct. No acute or chronic intracranial hemorrhage. No extra-axial collection. No intracranial metastatic disease again seen. Overall, appearance is slightly worsened and progressed from previous, with interval enlargement of multiple lesions as compared to previous. For example, a lesion positioned at the gray-white matter differentiation of the left parietal region now measures up to 11 mm, previously 9 mm (series 16, image 116). A lesion slightly anteriorly within the posterior left frontal region measures up to 10 mm, previously 7 mm (series 16, image 16). On the right, a lesion at the anterior  right frontal lobe now measures 6 mm, previously 5 mm (series 16, image 76). A dural based lesion at the right occipital lobe measures 8 mm, previously 5 mm (series 16, image 78). Additionally, several new punctate lesions are seen at the posterior left frontotemporal region, not definitely seen on prior (series 16, images 90, 81). Surrounding vasogenic edema seen about the larger lesions, mildly progressed, and most pronounced at the left parietal lobe. Mild mass effect on the adjacent left lateral ventricle with trace left-to-right shift. No hydrocephalus or trapping. Basilar cisterns remain patent. Vascular: Major intracranial vascular flow voids are maintained. Skull and upper cervical spine: Craniocervical junction within normal limits. Decreased T1 signal  intensity seen within the visualized bone marrow without focal marrow replacing lesion. No scalp soft tissue abnormality. Sinuses/Orbits: Right gaze noted. Globes and orbital soft tissues demonstrate no other acute finding. Paranasal sinuses are largely clear. No mastoid effusion. Inner ear structures grossly normal. Other: None. IMPRESSION: 1. Interval enlargement of multiple metastatic lesions involving the bilateral cerebral hemispheres, with a few new punctate lesions as above, consistent with interval progression of disease. Associated vasogenic edema about the larger lesions, also mildly progressed. Associated trace left-to-right shift without hydrocephalus or trapping. 2. No other acute intracranial abnormality. Electronically Signed   By: Jeannine Boga M.D.   On: 06/23/2021 22:16   CARDIAC CATHETERIZATION  Result Date: 06/30/2021 Successful pericardiocentesis via the subxiphoid area with removal of 1065 mL of bloody fluid. 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 Recommendations: The drainage was secured in place.  Monitor output and repeat limited echocardiogram tomorrow.  Suspect that the drainage can likely be removed tomorrow if no significant output. Will use fentanyl for pain control.   DG CHEST PORT 1 VIEW  Result Date: 07/06/2021 CLINICAL DATA:  Follow up pleural effusion following chest tube placement. History of lung cancer. EXAM: PORTABLE CHEST 1 VIEW COMPARISON:  Radiographs 07/02/2021 and 06/30/2021.  CT 06/30/2021. FINDINGS: 0535 hours. Left PleurX catheter remains partially looped inferiorly in the left hemithorax. Previously demonstrated large left pleural effusion has minimally decreased with resulting mild increase in left lung aeration. The visualized heart size and mediastinal contours are stable with a large pericardial effusion identified on recent CT. The right lung is clear. There is no evidence of pneumothorax or significant  right-sided pleural effusion. The bones appear unchanged. IMPRESSION: 1. Interval minimal decrease in size of left pleural effusion with mildly increased aeration of the left lung. This effusion appears loculated on CT. 2. Persistent enlargement of the cardiac silhouette corresponding without large pericardial effusion on recent CT. Electronically Signed   By: Richardean Sale M.D.   On: 07/06/2021 08:38   DG Chest Port 1 View  Result Date: 07/02/2021 CLINICAL DATA:  Recurrent left pleural effusion EXAM: PORTABLE CHEST 1 VIEW COMPARISON:  CT chest and chest radiographs dated June 30, 2021 FINDINGS: Near complete opacification of the left hemithorax due to effusion, tumor and lung atelectasis, unchanged. Small area of aerated lungs is also unchanged. Left-sided chest tube is present. No appreciable pneumothorax. Mediastinal shift to the right. Right lung is clear. No acute osseous abnormality. IMPRESSION: No significant interval change. Electronically Signed   By: Keane Police D.O.   On: 07/02/2021 10:00   ECHOCARDIOGRAM COMPLETE  Result Date: 06/30/2021    ECHOCARDIOGRAM REPORT   Patient Name:   JULINA ALTMANN Date of Exam: 06/30/2021 Medical Rec #:  [id number]          Height:  62.0 in Accession #:    [id number]         Weight:       110.0 lb Date of Birth:  [birth year]          BSA:          1.483 m Patient Age:    32 years           BP:           114/82 mmHg Patient Gender: F                  HR:           109 bpm. Exam Location:  Inpatient Procedure: 2D Echo, Cardiac Doppler and Color Doppler STAT ECHO Indications:    Pericardial effusion I31.3  History:        Patient has prior history of Echocardiogram examinations, most                 recent 01/18/2021. Risk Factors:Diabetes and Dyslipidemia. Lung                 Cancer.  Sonographer:    Darlina Sicilian RDCS Referring Phys: [id number] [facility]  Sonographer Comments: Recent Thoracentesis in Apical position. Imaging best from subcostal.  IMPRESSIONS  1. Left ventricular ejection fraction, by estimation, is 60 to 65%. The left ventricle has normal function. The left ventricle has no regional wall motion abnormalities. Left ventricular diastolic parameters are consistent with Grade I diastolic dysfunction (impaired relaxation).  2. Right ventricular systolic function is normal. The right ventricular size is normal. There is normal pulmonary artery systolic pressure.  3. Compared with the echo 01/2021, pericardial effusion is larger. Large pericardial effusion measuring up to 4.2 cm. Large pericardial effusion. The pericardial effusion is circumferential. Findings are consistent with cardiac tamponade.  4. The mitral valve is normal in structure. No evidence of mitral valve regurgitation. No evidence of mitral stenosis.  5. The aortic valve is tricuspid. Aortic valve regurgitation is not visualized. No aortic stenosis is present.  6. The inferior vena cava is normal in size with <50% respiratory variability, suggesting right atrial pressure of 8 mmHg. FINDINGS  Left Ventricle: Left ventricular ejection fraction, by estimation, is 60 to 65%. The left ventricle has normal function. The left ventricle has no regional wall motion abnormalities. The left ventricular internal cavity size was normal in size. There is  no left ventricular hypertrophy. Left ventricular diastolic parameters are consistent with Grade I diastolic dysfunction (impaired relaxation). Right Ventricle: The right ventricular size is normal. No increase in right ventricular wall thickness. Right ventricular systolic function is normal. There is normal pulmonary artery systolic pressure. The tricuspid regurgitant velocity is 1.94 m/s, and  with an assumed right atrial pressure of 8 mmHg, the estimated right ventricular systolic pressure is 39.0 mmHg. Left Atrium: Left atrial size was normal in size. Right Atrium: Right atrial size was normal in size. Pericardium: Compared with the echo  01/2021, pericardial effusion is larger. Large pericardial effusion measuring up to 4.2 cm. A large pericardial effusion is present. The pericardial effusion is circumferential. There is diastolic collapse of the right ventricular free wall, diastolic collapse of the right atrial wall and excessive respiratory variation in the mitral valve spectral Doppler velocities. There is evidence of cardiac tamponade. Mitral Valve: The mitral valve is normal in structure. No evidence of mitral valve regurgitation. No evidence of mitral valve stenosis. Tricuspid Valve: The tricuspid valve is normal in structure. Tricuspid  valve regurgitation is mild . No evidence of tricuspid stenosis. Aortic Valve: The aortic valve is tricuspid. Aortic valve regurgitation is not visualized. No aortic stenosis is present. Pulmonic Valve: The pulmonic valve was normal in structure. Pulmonic valve regurgitation is not visualized. No evidence of pulmonic stenosis. Aorta: The aortic root is normal in size and structure. Venous: The inferior vena cava is normal in size with less than 50% respiratory variability, suggesting right atrial pressure of 8 mmHg. IAS/Shunts: No atrial level shunt detected by color flow Doppler.  LEFT VENTRICLE PLAX 2D LVOT diam:     1.90 cm LV SV:         33 LV SV Index:   23 LVOT Area:     2.84 cm  AORTIC VALVE LVOT Vmax:   93.20 cm/s LVOT Vmean:  62.300 cm/s LVOT VTI:    0.118 m MITRAL VALVE               TRICUSPID VALVE MV Area (PHT): 4.86 cm    TR Peak grad:   15.1 mmHg MV Decel Time: 156 msec    TR Vmax:        194.00 cm/s MV E velocity: 43.30 cm/s MV A velocity: 58.30 cm/s  SHUNTS MV E/A ratio:  0.74        Systemic VTI:  0.12 m                            Systemic Diam: 1.90 cm Skeet Latch MD Electronically signed by Skeet Latch MD Signature Date/Time: 06/30/2021/3:43:19 PM    Final    ECHOCARDIOGRAM LIMITED  Result Date: 07/05/2021    ECHOCARDIOGRAM LIMITED REPORT   Patient Name:   VARINA HULON  Date of Exam: 07/05/2021 Medical Rec #:  [id number]          Height:       62.0 in Accession #:    [id number]         Weight:       104.1 lb Date of Birth:  12-May-1951          BSA:          1.448 m Patient Age:    71 years           BP:           110/72 mmHg Patient Gender: F                  HR:           97 bpm. Exam Location:  Inpatient Procedure: Limited Echo, Color Doppler and Cardiac Doppler Indications:    I31.3 Pericardial effusion (noninflammatory)  History:        Patient has prior history of Echocardiogram examinations, most                 recent 07/02/2021. Pericardial Disease, Abnormal ECG,                 Arrythmias:Tachycardia; Signs/Symptoms:Syncope. Cardiac                 tamponade. Pleural effusion. Lung and breast cancer.  Sonographer:    Roseanna Rainbow RDCS Referring Phys: [id number] [place]  Sonographer Comments: Technically difficult study due to poor echo windows. Patient supine with left pleural drain. IMPRESSIONS  1. Abnormal septal motion . Left ventricular ejection fraction, by estimation, is 60 to 65%. The left ventricle has normal function. The left ventricle has  no regional wall motion abnormalities.  2. Right ventricular systolic function is normal. The right ventricular size is normal.  3. Right atrial size was mildly dilated.  4. The pericardial effusion is RA posterior.  5. The mitral valve is normal in structure. No evidence of mitral valve regurgitation. No evidence of mitral stenosis.  6. The aortic valve is normal in structure. There is mild calcification of the aortic valve. Aortic valve regurgitation is not visualized. Aortic valve sclerosis is present, with no evidence of aortic valve stenosis.  7. The inferior vena cava is normal in size with greater than 50% respiratory variability, suggesting right atrial pressure of 3 mmHg. FINDINGS  Left Ventricle: Abnormal septal motion. Left ventricular ejection fraction, by estimation, is 60 to 65%. The left ventricle has  normal function. The left ventricle has no regional wall motion abnormalities. The left ventricular internal cavity size was normal in size. There is no left ventricular hypertrophy. Right Ventricle: The right ventricular size is normal. No increase in right ventricular wall thickness. Right ventricular systolic function is normal. Left Atrium: Left atrial size was normal in size. Right Atrium: Right atrial size was mildly dilated. Pericardium: Trivial pericardial effusion is present. The pericardial effusion is RA posterior. Mitral Valve: The mitral valve is normal in structure. No evidence of mitral valve stenosis. Tricuspid Valve: The tricuspid valve is normal in structure. Tricuspid valve regurgitation is mild . No evidence of tricuspid stenosis. Aortic Valve: The aortic valve is normal in structure. There is mild calcification of the aortic valve. Aortic valve regurgitation is not visualized. Aortic valve sclerosis is present, with no evidence of aortic valve stenosis. Pulmonic Valve: The pulmonic valve was normal in structure. Pulmonic valve regurgitation is not visualized. No evidence of pulmonic stenosis. Aorta: The aortic root is normal in size and structure. Venous: The inferior vena cava is normal in size with greater than 50% respiratory variability, suggesting right atrial pressure of 3 mmHg. IAS/Shunts: No atrial level shunt detected by color flow Doppler. LEFT VENTRICLE PLAX 2D LVIDd:         3.70 cm LVIDs:         2.60 cm LV PW:         0.90 cm LV IVS:        1.00 cm  LV Volumes (MOD) LV vol d, MOD A2C: 53.8 ml LV vol d, MOD A4C: 58.5 ml LV vol s, MOD A2C: 21.7 ml LV vol s, MOD A4C: 22.2 ml LV SV MOD A2C:     32.1 ml LV SV MOD A4C:     58.5 ml LV SV MOD BP:      36.5 ml IVC IVC diam: 1.70 cm LEFT ATRIUM         Index LA diam:    2.40 cm 1.66 cm/m   AORTA Ao Root diam: 2.90 cm Jenkins Rouge MD Electronically signed by Jenkins Rouge MD Signature Date/Time: 07/05/2021/10:19:36 AM    Final     ECHOCARDIOGRAM LIMITED  Result Date: 07/02/2021    ECHOCARDIOGRAM LIMITED REPORT   Patient Name:   TYRONICA TRUXILLO Date of Exam: 07/02/2021 Medical Rec #:  [id number]          Height:       62.0 in Accession #:    [id number]         Weight:       110.0 lb Date of Birth:  1951/06/02          BSA:  1.483 m Patient Age:    46 years           BP:           101/73 mmHg Patient Gender: F                  HR:           102 bpm. Exam Location:  Inpatient Procedure: Limited Echo Indications:    Pericardial effusion I31.3  History:        Patient has prior history of Echocardiogram examinations, most                 recent 07/01/2021. Risk Factors:Diabetes and Dyslipidemia. Lung                 Cancer.  Sonographer:    Darlina Sicilian RDCS Referring Phys: [id number] [facility] A CHANDRASEKHAR  Conclusion(s)/Recommendation(s): Normal LV function, EF 60-65%. RV normal size and function. Small pericardial effusion along the RV free wall. No cardiac tamponade. Phineas Inches Electronically signed by Phineas Inches Signature Date/Time: 07/02/2021/9:05:55 AM    Final    ECHOCARDIOGRAM LIMITED  Result Date: 07/01/2021    ECHOCARDIOGRAM LIMITED REPORT   Patient Name:   TONDA WIEDERHOLD Date of Exam: 07/01/2021 Medical Rec #:  [id number]          Height:       62.0 in Accession #:    [id number]         Weight:       110.0 lb Date of Birth:  1950-10-14          BSA:          1.483 m Patient Age:    27 years           BP:           94/72 mmHg Patient Gender: F                  HR:           126 bpm. Exam Location:  Inpatient Procedure: Limited Echo Indications:    Pericardial Effusion  History:        Patient has prior history of Echocardiogram examinations, most                 recent 06/30/2021.  Sonographer:    Jefferey Pica Referring Phys: [id number] A ARIDA  Sonographer Comments: Unable to obtain images from all windows due to dressings. IMPRESSIONS  1. Limited study to assess pericardial effusion; small pericardial  effusion predominantly surrounding right atrium; effusion much smaller compared to 06/30/20.  2. Left ventricular ejection fraction, by estimation, is 50 to 55%. The left ventricle has low normal function. The left ventricle has no regional wall motion abnormalities.  3. Right ventricular systolic function is normal. The right ventricular size is normal.  4. A small pericardial effusion is present.  5. The mitral valve is normal in structure.  6. The aortic valve has an indeterminant number of cusps.  7. The inferior vena cava is normal in size with greater than 50% respiratory variability, suggesting right atrial pressure of 3 mmHg. FINDINGS  Left Ventricle: Left ventricular ejection fraction, by estimation, is 50 to 55%. The left ventricle has low normal function. The left ventricle has no regional wall motion abnormalities. The left ventricular internal cavity size was normal in size. Right Ventricle: The right ventricular size is normal. Right ventricular systolic function is normal. Left Atrium: Left atrial size was  normal in size. Right Atrium: Right atrial size was normal in size. Pericardium: A small pericardial effusion is present. Mitral Valve: The mitral valve is normal in structure. Tricuspid Valve: The tricuspid valve is normal in structure. Aortic Valve: The aortic valve has an indeterminant number of cusps. Venous: The inferior vena cava is normal in size with greater than 50% respiratory variability, suggesting right atrial pressure of 3 mmHg. Additional Comments: Limited study to assess pericardial effusion; small pericardial effusion predominantly surrounding right atrium; effusion much smaller compared to 06/30/20. LEFT VENTRICLE PLAX 2D LVIDd:         4.20 cm LVIDs:         3.00 cm LV PW:         1.10 cm LV IVS:        1.00 cm LVOT diam:     2.00 cm LVOT Area:     3.14 cm  IVC IVC diam: 2.00 cm LEFT ATRIUM         Index LA diam:    3.20 cm 2.16 cm/m   AORTA Ao Root diam: 3.50 cm Ao Asc diam:   3.50 cm  SHUNTS Systemic Diam: 2.00 cm Kirk Ruths MD Electronically signed by Kirk Ruths MD Signature Date/Time: 07/01/2021/11:59:27 AM    Final    ECHOCARDIOGRAM LIMITED  Result Date: 06/30/2021    ECHOCARDIOGRAM LIMITED REPORT   Patient Name:   IANNA SALMELA Date of Exam: 06/30/2021 Medical Rec #:  [id number]          Height:       62.0 in Accession #:    [id number]         Weight:       110.0 lb Date of Birth:  Nov 23, 1950          BSA:          1.483 m Patient Age:    89 years           BP:           116/68 mmHg Patient Gender: F                  HR:           104 bpm. Exam Location:  Inpatient Procedure: Limited Echo       STAT ECHO Reported to: DR Fletcher Anon. Indications:    pericardiocentesis  History:        Patient has prior history of Echocardiogram examinations, most                 recent 06/30/2020. Cancer.  Sonographer:    Johny Chess RDCS Referring Phys: [place]  1. Pericardiocenteisis performed. Prior to the procedure there was a large, circumferential pericardial effusion. After pericardiocentesis there was no effusion present. Resolution of right ventricular diastolic collapse.  2. Left ventricular ejection fraction, by estimation, is 60 to 65%. The left ventricle has normal function.  3. Large pericardial effusion. The pericardial effusion is circumferential. FINDINGS  Left Ventricle: Left ventricular ejection fraction, by estimation, is 60 to 65%. The left ventricle has normal function. Pericardium: A large pericardial effusion is present. The pericardial effusion is circumferential. Skeet Latch MD Electronically signed by Skeet Latch MD Signature Date/Time: 06/30/2021/7:31:10 PM    Final      Subjective: No acute issue/events overnight   Discharge Exam: Vitals:   07/08/21 1121 07/08/21 1200  BP:    Pulse:  100  Resp:  (!) 22  Temp: 98.7 F (  37.1 C)   SpO2:  96%   Vitals:   07/08/21 1000 07/08/21 1100 07/08/21 1121 07/08/21 1200  BP:  96/70 90/63    Pulse: 90 89  100  Resp: (!) 26 (!) 28  (!) 22  Temp:   98.7 F (37.1 C)   TempSrc:   Oral   SpO2: 97% 98%  96%  Weight:      Height:        General: Pt is alert, awake, not in acute distress Cardiovascular: RRR, S1/S2 +, no rubs, no gallops Respiratory: CTA bilaterally, no wheezing, no rhonchi Abdominal: Soft, NT, ND, bowel sounds + Extremities: no edema, no cyanosis    The results of significant diagnostics from this hospitalization (including imaging, microbiology, ancillary and laboratory) are listed below for reference.     Microbiology: Recent Results (from the past 240 hour(s))  Culture, blood (routine x 2)     Status: None   Collection Time: 06/30/21 11:03 AM   Specimen: BLOOD  Result Value Ref Range Status   Specimen Description   Final    BLOOD LEFT ANTECUBITAL Performed at [facility] [address] [place], [place] [address]    Special Requests   Final    Blood Culture adequate volume BOTTLES DRAWN AEROBIC AND ANAEROBIC Performed at [facility] [address], [place], [place] [address]    Culture   Final    NO GROWTH 5 DAYS Performed at [facility], [place] [address] [place], [place] [address]    Report Status 07/05/2021 FINAL  Final  Culture, blood (routine x 2)     Status: None   Collection Time: 06/30/21 11:08 AM   Specimen: BLOOD  Result Value Ref Range Status   Specimen Description   Final    BLOOD BLOOD LEFT HAND Performed at [facility] [address] [place], [place] [address]    Special Requests   Final    Blood Culture results may not be optimal due to an inadequate volume of blood received in culture bottles BOTTLES DRAWN AEROBIC AND ANAEROBIC Performed at [facility], [place] [address] [place], [place] [address]    Culture   Final    NO GROWTH 5 DAYS Performed at [facility], [place] [address], [place], [place] [address]    Report  Status 07/05/2021 FINAL  Final  Resp Panel by RT-PCR (Flu A&B, Covid)     Status: None   Collection Time: 06/30/21 12:41 PM   Specimen: Nasopharyngeal(NP) swabs in vial transport medium  Result Value Ref Range Status   SARS Coronavirus 2 by RT PCR NEGATIVE NEGATIVE Final    Comment: (NOTE) SARS-CoV-2 target nucleic acids are NOT DETECTED.  The SARS-CoV-2 RNA is generally detectable in upper respiratory specimens during the acute phase of infection. The lowest concentration of SARS-CoV-2 viral copies this assay can detect is 138 copies/mL. A negative result does not preclude SARS-Cov-2 infection and should not be used as the sole basis for treatment or other patient management decisions. A negative result may occur with  improper specimen collection/handling, submission of specimen other than nasopharyngeal swab, presence of viral mutation(s) within the areas targeted by this assay, and inadequate number of viral copies(<138 copies/mL). A negative result must be combined with clinical observations, patient history, and epidemiological information. The expected result is Negative.  Fact Sheet for Patients:  EntrepreneurPulse.com.au  Fact Sheet for Healthcare Providers:  IncredibleEmployment.be  This test is no  t yet approved or cleared by the [place] and  has been authorized for detection and/or diagnosis of SARS-CoV-2 by FDA under an Emergency Use Authorization (EUA). This EUA will remain  in effect (meaning this test can be used) for the duration of the COVID-19 declaration under Section 564(b)(1) of the Act, 21 U.S.C.section 360bbb-3(b)(1), unless the authorization is terminated  or revoked sooner.       Influenza A by PCR NEGATIVE NEGATIVE Final   Influenza B by PCR NEGATIVE NEGATIVE Final    Comment: (NOTE) The Xpert Xpress SARS-CoV-2/FLU/RSV plus assay is intended as an aid in the diagnosis of influenza from Nasopharyngeal  swab specimens and should not be used as a sole basis for treatment. Nasal washings and aspirates are unacceptable for Xpert Xpress SARS-CoV-2/FLU/RSV testing.  Fact Sheet for Patients: EntrepreneurPulse.com.au  Fact Sheet for Healthcare Providers: IncredibleEmployment.be  This test is not yet approved or cleared by the [place] FDA and has been authorized for detection and/or diagnosis of SARS-CoV-2 by FDA under an Emergency Use Authorization (EUA). This EUA will remain in effect (meaning this test can be used) for the duration of the COVID-19 declaration under Section 564(b)(1) of the Act, 21 U.S.C. section 360bbb-3(b)(1), unless the authorization is terminated or revoked.  Performed at [facility], [place] [address] [place], [place] [address]   Culture, body fluid w Gram Stain-bottle     Status: None   Collection Time: 06/30/21  5:02 PM   Specimen: Fluid  Result Value Ref Range Status   Specimen Description FLUID PERICARDIAL  Final   Special Requests BOTTLES DRAWN AEROBIC AND ANAEROBIC  Final   Culture   Final    NO GROWTH 5 DAYS Performed at [facility], [place] [address], [place], [place] [address]    Report Status 07/05/2021 FINAL  Final  Gram stain     Status: None   Collection Time: 06/30/21  5:02 PM   Specimen: Fluid  Result Value Ref Range Status   Specimen Description FLUID PERICARDIAL  Final   Special Requests NONE  Final   Gram Stain   Final    FEW WBC PRESENT, PREDOMINANTLY MONONUCLEAR NO ORGANISMS SEEN Performed at [facility], [place] [address], [place], [place] [address]    Report Status 06/30/2021 FINAL  Final  MRSA Next Gen by PCR, Nasal     Status: None   Collection Time: 06/30/21  5:55 PM   Specimen: Nasal Mucosa; Nasal Swab  Result Value Ref Range Status   MRSA by PCR Next Gen NOT DETECTED NOT DETECTED Final    Comment: (NOTE) The GeneXpert MRSA Assay (FDA approved for NASAL  specimens only), is one component of a comprehensive MRSA colonization surveillance program. It is not intended to diagnose MRSA infection nor to guide or monitor treatment for MRSA infections. Test performance is not FDA approved in patients less than 45 years old. Performed at [facility], [place] [address] [place], [place] [address]      Labs: BNP (last 3 results) Recent Labs    01/18/21 1400 06/30/21 1103  BNP 16.6 62.2   Basic Metabolic Panel: Recent Labs  Lab 07/02/21 0254 07/03/21 0323 07/04/21 0037 07/05/21 0120 07/07/21 0444  NA 132* 133* 133* 134* 126*  K 4.7 5.4* 4.7 4.6 4.7  CL 98 98 95* 100 91*  CO2 25 27 26 27 26   GLUCOSE 190* 147* 97 127* 98  BUN 18 17 17 19  15  CREATININE 0.50 0.55 0.43* 0.42* 0.38*  CALCIUM 8.7* 8.5* 9.0 9.0 8.7*  MG  --   --   --  1.9  --    Liver Function Tests: No results for input(s): AST, ALT, ALKPHOS, BILITOT, PROT, ALBUMIN in the last 168 hours. No results for input(s): LIPASE, AMYLASE in the last 168 hours. No results for input(s): AMMONIA in the last 168 hours. CBC: Recent Labs  Lab 07/02/21 0107 07/03/21 0323 07/04/21 0037 07/05/21 0120 07/07/21 0444  WBC 16.8* 15.4* 17.3* 20.8* 22.7*  HGB 10.9* 10.3* 10.6* 10.9* 11.3*  HCT 35.0* 33.8* 34.4* 35.0* 36.1  MCV 86.2 87.1 86.4 85.8 85.7  PLT 596* 581* 649* 545* 512*   Cardiac Enzymes: No results for input(s): CKTOTAL, CKMB, CKMBINDEX, TROPONINI in the last 168 hours. BNP: Invalid input(s): POCBNP CBG: Recent Labs  Lab 07/07/21 0655 07/07/21 1647 07/07/21 2147 07/08/21 0650 07/08/21 1122  GLUCAP 100* 222* 97 113* 112*   D-Dimer No results for input(s): DDIMER in the last 72 hours. Hgb A1c No results for input(s): HGBA1C in the last 72 hours. Lipid Profile No results for input(s): CHOL, HDL, LDLCALC, TRIG, CHOLHDL, LDLDIRECT in the last 72 hours. Thyroid function studies No results for input(s): TSH, T4TOTAL, T3FREE, THYROIDAB in the last 72  hours.  Invalid input(s): FREET3 Anemia work up No results for input(s): VITAMINB12, FOLATE, FERRITIN, TIBC, IRON, RETICCTPCT in the last 72 hours. Urinalysis    Component Value Date/Time   COLORURINE STRAW (A) 06/24/2021 0642   APPEARANCEUR CLEAR 06/24/2021 0642   LABSPEC 1.009 06/24/2021 0642   PHURINE 6.0 06/24/2021 0642   GLUCOSEU NEGATIVE 06/24/2021 0642   HGBUR NEGATIVE 06/24/2021 0642   BILIRUBINUR NEGATIVE 06/24/2021 0642   KETONESUR 20 (A) 06/24/2021 0642   PROTEINUR NEGATIVE 06/24/2021 0642   NITRITE NEGATIVE 06/24/2021 0642   LEUKOCYTESUR NEGATIVE 06/24/2021 1779   Sepsis Labs Invalid input(s): PROCALCITONIN,  WBC,  LACTICIDVEN Microbiology Recent Results (from the past 240 hour(s))  Culture, blood (routine x 2)     Status: None   Collection Time: 06/30/21 11:03 AM   Specimen: BLOOD  Result Value Ref Range Status   Specimen Description   Final    BLOOD LEFT ANTECUBITAL Performed at [facility], [place] [address] [place], [place] [address]    Special Requests   Final    Blood Culture adequate volume BOTTLES DRAWN AEROBIC AND ANAEROBIC Performed at [facility] [address], [place], [place] [address]    Culture   Final    NO GROWTH 5 DAYS Performed at [facility], [place] [address] [place], [place] [address]    Report Status 07/05/2021 FINAL  Final  Culture, blood (routine x 2)     Status: None   Collection Time: 06/30/21 11:08 AM   Specimen: BLOOD  Result Value Ref Range Status   Specimen Description   Final    BLOOD BLOOD LEFT HAND Performed at [facility] [address] [place], [place] [address]    Special Requests   Final    Blood Culture results may not be optimal due to an inadequate volume of blood received in culture bottles BOTTLES DRAWN AEROBIC AND ANAEROBIC Performed at [facility], [place] [address] [place], [place] [address]    Culture   Final    NO GROWTH  5 DAYS Performed at [facility], [place] [address], [place], [place] [address]    Report Status 07/05/2021 FINAL  Final  Resp Panel by RT-PCR (Flu A&B, Covid)     Status: None   Collection Time: 06/30/21 12:41 PM   Specimen: Nasopharyngeal(NP) swabs in vial transport medium  Result Value Ref Range Status   SARS Coronavirus 2 by RT PCR NEGATIVE NEGATIVE Final    Comment: (NOTE) SARS-CoV-2 target nucleic acids are NOT DETECTED.  The SARS-CoV-2 RNA is generally detectable in upper respiratory specimens during the acute phase of infection. The lowest concentration of SARS-CoV-2 viral copies this assay can detect is 138 copies/mL. A negative result does not preclude SARS-Cov-2 infection and should not be used as the sole basis for treatment or other patient management decisions. A negative result may occur with  improper specimen collection/handling, submission of specimen other than nasopharyngeal swab, presence of viral mutation(s) within the areas targeted by this assay, and inadequate number of viral copies(<138 copies/mL). A negative result must be combined with clinical observations, patient history, and epidemiological information. The expected result is Negative.  Fact Sheet for Patients:  EntrepreneurPulse.com.au  Fact Sheet for Healthcare Providers:  IncredibleEmployment.be  This test is no t yet approved or cleared by the [place] FDA and  has been authorized for detection and/or diagnosis of SARS-CoV-2 by FDA under an Emergency Use Authorization (EUA). This EUA will remain  in effect (meaning this test can be used) for the duration of the COVID-19 declaration under Section 564(b)(1) of the Act, 21 U.S.C.section 360bbb-3(b)(1), unless the authorization is terminated  or revoked sooner.       Influenza A by PCR NEGATIVE NEGATIVE Final   Influenza B by PCR NEGATIVE NEGATIVE Final    Comment: (NOTE) The Xpert Xpress  SARS-CoV-2/FLU/RSV plus assay is intended as an aid in the diagnosis of influenza from Nasopharyngeal swab specimens and should not be used as a sole basis for treatment. Nasal washings and aspirates are unacceptable for Xpert Xpress SARS-CoV-2/FLU/RSV testing.  Fact Sheet for Patients: EntrepreneurPulse.com.au  Fact Sheet for Healthcare Providers: IncredibleEmployment.be  This test is not yet approved or cleared by the [place] FDA and has been authorized for detection and/or diagnosis of SARS-CoV-2 by FDA under an Emergency Use Authorization (EUA). This EUA will remain in effect (meaning this test can be used) for the duration of the COVID-19 declaration under Section 564(b)(1) of the Act, 21 U.S.C. section 360bbb-3(b)(1), unless the authorization is terminated or revoked.  Performed at [facility], [place] [address], [place], [place] [address]   Culture, body fluid w Gram Stain-bottle     Status: None   Collection Time: 06/30/21  5:02 PM   Specimen: Fluid  Result Value Ref Range Status   Specimen Description FLUID PERICARDIAL  Final   Special Requests BOTTLES DRAWN AEROBIC AND ANAEROBIC  Final   Culture   Final    NO GROWTH 5 DAYS Performed at [facility], [place] [address], [place], [place] [address]    Report Status 07/05/2021 FINAL  Final  Gram stain     Status: None   Collection Time: 06/30/21  5:02 PM   Specimen: Fluid  Result Value Ref Range Status   Specimen Description FLUID PERICARDIAL  Final   Special Requests NONE  Final   Gram Stain   Final    FEW WBC PRESENT, PREDOMINANTLY MONONUCLEAR NO ORGANISMS SEEN Performed at [facility], [place] [address] [place], [place] [address]    Report Status 06/30/2021 FINAL  Final  MRSA Next Gen by PCR, Nasal  Status: None   Collection Time: 06/30/21  5:55 PM   Specimen: Nasal Mucosa; Nasal Swab  Result Value Ref Range Status   MRSA by PCR Next Gen NOT  DETECTED NOT DETECTED Final    Comment: (NOTE) The GeneXpert MRSA Assay (FDA approved for NASAL specimens only), is one component of a comprehensive MRSA colonization surveillance program. It is not intended to diagnose MRSA infection nor to guide or monitor treatment for MRSA infections. Test performance is not FDA approved in patients less than 110 years old. Performed at [facility], [place] [address] [place], [place] [address]      Time coordinating discharge: Over 30 minutes  SIGNED:   Little Ishikawa, DO [facility] 07/08/2021, 2:55 PM Pager   If 7PM-7AM, please contact night-coverage [email or web address]

## 2021-07-08 NOTE — Progress Notes (Signed)
So far, everything is holding pretty stable.  She probably has a Pleurx catheter that may not be working all that well.  She has had medication injected to try to help break up any type of adhesions in the catheter/pleural space.  It sounds like the catheter ultimately may need to be taken out.  She has not had any lab work done yet today.  She had problems with constipation.  Things finally got little bit better.  I think she went to the bathroom last night.  She was not eating all that much.  I think this is reflected in her prealbumin only being 8.4.  She has not had any issues with vomiting.  Maybe a little bit of nausea.  She is still thinking about treatment.  I know this will be very challenging to do given her overall performance status.  I think that if we treated her, or if she agreed to have treatment, I would consider carboplatinum/Alimta/pembrolizumab.  She is thinking about treatment.  She just does not want to get sick with treatment.  She does not want lose her hair with treatment.  She actually may go home today she says.  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  She says they feel heavy.  She says she can stand.  I know this is very difficult.  I know that her situation is certainly not curable.  She understands this.  I just want to try to treat her, if possible, to try to prevent complications.  I have a feeling that if she develops a complete obstruction of her left lung, I think this would be incredibly difficult for her to overcome.  If she does go home today, we will see about getting her in next week and discussed with her what her wishes are with respect to therapy.  I do appreciate the wonderful care that she is getting from the staff in the cardiac ICU.  Lattie Haw, MD  [place] 2:9

## 2021-07-08 NOTE — Evaluation (Addendum)
Occupational Therapy Evaluation Patient Details Name: Tami Stone MRN: [id number] DOB: [birth year] Today's Date: 07/08/2021   History of Present Illness 71 year old woman who presented to [facility] 1/18 for SOB, found to have pericardial effusion with cardiac tamponade. Recent admission 1/11 - 1/14 for worsening vasogenic edema in the setting of brain metastasis. 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   Clinical Impression   PTA, pt was living her wife and was independent with ADLs and mobility until recently. Pt currently requiring Min A for UB ADLs, Min-Mod A for LB ADLs, and Min-Mod A for functional transfers. Pt presenting with decreased balance and activity tolerance; fatigues quickly. Planning for dc later today. Answered all questions. Recommend dc to home with HHOT for further OT to optimize safety, independence with ADLs, and return to PLOF.       Recommendations for follow up therapy are one component of a multi-disciplinary discharge planning process, led by the attending physician.  Recommendations may be updated based on patient status, additional functional criteria and insurance authorization.   Follow Up Recommendations  Home health OT    Assistance Recommended at Discharge Frequent or constant Supervision/Assistance  Patient can return home with the following A lot of help with bathing/dressing/bathroom;A lot of help with walking and/or transfers    Functional Status Assessment  Patient has had a recent decline in their functional status and demonstrates the ability to make significant improvements in function in a reasonable and predictable amount of time.  Equipment Recommendations  BSC/3in1;Other (comment) (RW)    Recommendations for Other Services PT consult     Precautions / Restrictions  Precautions Precautions: Fall Restrictions Weight Bearing Restrictions: No      Mobility Bed Mobility Overal bed mobility: Needs Assistance Bed Mobility: Supine to Sit, Sit to Supine     Supine to sit: Min guard, HOB elevated Sit to supine: Min guard   General bed mobility comments: Min Guard A for safety    Transfers Overall transfer level: Needs assistance Equipment used: 1 person hand held assist Transfers: Sit to/from Stand Sit to Stand: Min assist, Mod assist           General transfer comment: Min A for power up with BLEs bracing against bed. Mod A for balance when not bracing herself on bed      Balance Overall balance assessment: Needs assistance Sitting-balance support: No upper extremity supported, Feet supported Sitting balance-Leahy Scale: Fair Sitting balance - Comments: Able to maintain static sitting at EOB. However, R lateral lean in sitting which she had to correct   Standing balance support: During functional activity, Single extremity supported Standing balance-Leahy Scale: Poor Standing balance comment: posterior lean in standing                           ADL either performed or assessed with clinical judgement   ADL Overall ADL's : Needs assistance/impaired Eating/Feeding: Set up;Sitting   Grooming: Min guard;Sitting   Upper Body Bathing: Min guard;Sitting   Lower Body Bathing: Moderate assistance;Sit to/from stand   Upper Body Dressing : Min guard;Sitting   Lower Body Dressing: Minimal assistance;Sit to/from stand Lower Body Dressing Details (indicate cue type and reason): Pt donning depends at bend level with bridging technique and then yoga pants while sitting at EOB. Min A for pulling over legs due to fatigue and  then standing balance     Toileting- Clothing Manipulation and Hygiene: Supervision/safety;Bed level       Functional mobility during ADLs: Moderate assistance (sit<>stand) General ADL Comments: Pt presenting  with decreased activity tolerance     Vision         Perception     Praxis      Pertinent Vitals/Pain Pain Assessment Pain Assessment: Faces Faces Pain Scale: Hurts even more Pain Location: generalized Pain Descriptors / Indicators: Aching, Grimacing, Guarding Pain Intervention(s): Monitored during session, Limited activity within patient's tolerance, Repositioned     Hand Dominance     Extremity/Trunk Assessment Upper Extremity Assessment Upper Extremity Assessment: Generalized weakness   Lower Extremity Assessment Lower Extremity Assessment: Defer to PT evaluation RLE Deficits / Details: grossly 3/5 LLE Deficits / Details: grossly 3-/5   Cervical / Trunk Assessment Cervical / Trunk Assessment: Kyphotic;Other exceptions Cervical / Trunk Exceptions: R lateral lean in sitting; posterior lean in standing   Communication Communication Communication: No difficulties   Cognition Arousal/Alertness: Lethargic, Suspect due to medications Behavior During Therapy: WFL for tasks assessed/performed Overall Cognitive Status: Within Functional Limits for tasks assessed                                 General Comments: Pt reporting she feels sleepy from pain medication     General Comments  VSS on RA    Exercises     Shoulder Instructions      Home Living Family/patient expects to be discharged to:: Private residence Living Arrangements: Spouse/significant other Available Help at Discharge: Family;Available 24 hours/day (daughter 44 years old TBI since 81 that they are caregivers for) Type of Home: House Home Access: Level entry     Home Layout: One level     Bathroom Shower/Tub: Occupational psychologist: Standard ([employer])     Home Equipment: Systems analyst;Shower seat          Prior Functioning/Environment Prior Level of Function : Independent/Modified Independent             Mobility Comments: States she was walking up until  recently when husband had to use transport chair with her. ADLs Comments: Reports she was independent with ADLs, IADLs, and driving until recent weakness        OT Problem List: Decreased strength;Decreased range of motion;Decreased activity tolerance;Impaired balance (sitting and/or standing);Decreased knowledge of use of DME or AE;Decreased knowledge of precautions      OT Treatment/Interventions:      OT Goals(Current goals can be found in the care plan section) Acute Rehab OT Goals Patient Stated Goal: Go home OT Goal Formulation: With patient Time For Goal Achievement: 07/22/21 Potential to Achieve Goals: Good  OT Frequency:      Co-evaluation              AM-PAC OT "6 Clicks" Daily Activity     Outcome Measure Help from another person eating meals?: A Little Help from another person taking care of personal grooming?: A Little Help from another person toileting, which includes using toliet, bedpan, or urinal?: A Lot Help from another person bathing (including washing, rinsing, drying)?: A Lot Help from another person to put on and taking off regular upper body clothing?: A Little Help from another person to put on and taking off regular lower body clothing?: A Lot 6 Click Score: 15   End of Session Nurse Communication: Mobility status  Activity Tolerance: Patient limited by fatigue Patient left: in bed;with call bell/phone within reach  OT Visit Diagnosis: Unsteadiness on feet (R26.81);Other abnormalities of gait and mobility (R26.89);Muscle weakness (generalized) (M62.81)                Time: 4801-6553 OT Time Calculation (min): 22 min Charges:  OT General Charges $OT Visit: 1 Visit OT Evaluation $OT Eval Moderate Complexity: [place], OTR/L Acute Rehab Pager: [phone] Office: [place] 07/08/2021, 1:40 PM

## 2021-07-08 NOTE — Care Management (Cosign Needed Addendum)
°  °  Durable Medical Equipment  (From admission, onward)           Start     Ordered   Unscheduled  For home use only DME Bedside commode  Once       Question:  Patient needs a bedside commode to treat with the following condition  Answer:  General weakness   07/08/21 1111   Unscheduled  For home use only DME lightweight manual wheelchair with seat cushion  Once       Comments: Patient suffers from shortness of breath 2/2 pericardial effusion with cardiac tamponadewhich impairs their ability to perform daily activities like ambulating, dressing, and grooming in the home.  A rolling walker will not resolve  issue with performing activities of daily living. A wheelchair will allow patient to safely perform daily activities. Patient is not able to propel themselves in the home using a standard weight wheelchair due to general weakness. Patient can self propel in the lightweight wheelchair. Length of need lifetime.  Accessories: elevating leg rests (ELRs), wheel locks, extensions and anti-tippers.   07/08/21 1111

## 2021-07-12 ENCOUNTER — Telehealth: Payer: Self-pay | Admitting: *Deleted

## 2021-07-12 ENCOUNTER — Other Ambulatory Visit: Payer: Self-pay | Admitting: Pharmacist

## 2021-07-12 NOTE — Telephone Encounter (Signed)
Per secure chat [place] - called and gave upcoming appointments - confirmed

## 2021-07-14 ENCOUNTER — Inpatient Hospital Stay: Payer: Medicare Other

## 2021-07-14 ENCOUNTER — Other Ambulatory Visit: Payer: Self-pay | Admitting: *Deleted

## 2021-07-14 ENCOUNTER — Inpatient Hospital Stay: Payer: Medicare Other | Attending: Hematology & Oncology

## 2021-07-14 ENCOUNTER — Encounter: Payer: Self-pay | Admitting: Hematology & Oncology

## 2021-07-14 ENCOUNTER — Other Ambulatory Visit: Payer: Self-pay

## 2021-07-14 ENCOUNTER — Inpatient Hospital Stay (HOSPITAL_BASED_OUTPATIENT_CLINIC_OR_DEPARTMENT_OTHER): Payer: Medicare Other | Admitting: Hematology & Oncology

## 2021-07-14 VITALS — BP 69/40 | HR 87 | Temp 97.7°F | Resp 17

## 2021-07-14 VITALS — BP 84/47 | HR 88

## 2021-07-14 DIAGNOSIS — C3432 Malignant neoplasm of lower lobe, left bronchus or lung: Secondary | ICD-10-CM | POA: Diagnosis not present

## 2021-07-14 DIAGNOSIS — Z170 Estrogen receptor positive status [ER+]: Secondary | ICD-10-CM

## 2021-07-14 DIAGNOSIS — C3492 Malignant neoplasm of unspecified part of left bronchus or lung: Secondary | ICD-10-CM

## 2021-07-14 DIAGNOSIS — C782 Secondary malignant neoplasm of pleura: Secondary | ICD-10-CM | POA: Insufficient documentation

## 2021-07-14 DIAGNOSIS — G629 Polyneuropathy, unspecified: Secondary | ICD-10-CM | POA: Insufficient documentation

## 2021-07-14 DIAGNOSIS — J90 Pleural effusion, not elsewhere classified: Secondary | ICD-10-CM

## 2021-07-14 DIAGNOSIS — E039 Hypothyroidism, unspecified: Secondary | ICD-10-CM

## 2021-07-14 DIAGNOSIS — C7951 Secondary malignant neoplasm of bone: Secondary | ICD-10-CM | POA: Insufficient documentation

## 2021-07-14 DIAGNOSIS — C7931 Secondary malignant neoplasm of brain: Secondary | ICD-10-CM | POA: Insufficient documentation

## 2021-07-14 DIAGNOSIS — C50512 Malignant neoplasm of lower-outer quadrant of left female breast: Secondary | ICD-10-CM

## 2021-07-14 DIAGNOSIS — E860 Dehydration: Secondary | ICD-10-CM | POA: Insufficient documentation

## 2021-07-14 DIAGNOSIS — Z79899 Other long term (current) drug therapy: Secondary | ICD-10-CM | POA: Diagnosis not present

## 2021-07-14 LAB — CBC WITH DIFFERENTIAL (CANCER CENTER ONLY)
Abs Immature Granulocytes: 0.29 10*3/uL — ABNORMAL HIGH (ref 0.00–0.07)
Basophils Absolute: 0.1 10*3/uL (ref 0.0–0.1)
Basophils Relative: 0 %
Eosinophils Absolute: 1.4 10*3/uL — ABNORMAL HIGH (ref 0.0–0.5)
Eosinophils Relative: 6 %
HCT: 35.9 % — ABNORMAL LOW (ref 36.0–46.0)
Hemoglobin: 11.6 g/dL — ABNORMAL LOW (ref 12.0–15.0)
Immature Granulocytes: 1 %
Lymphocytes Relative: 9 %
Lymphs Abs: 2.2 10*3/uL (ref 0.7–4.0)
MCH: 27.3 pg (ref 26.0–34.0)
MCHC: 32.3 g/dL (ref 30.0–36.0)
MCV: 84.5 fL (ref 80.0–100.0)
Monocytes Absolute: 1.7 10*3/uL — ABNORMAL HIGH (ref 0.1–1.0)
Monocytes Relative: 7 %
Neutro Abs: 18.9 10*3/uL — ABNORMAL HIGH (ref 1.7–7.7)
Neutrophils Relative %: 77 %
Platelet Count: 565 10*3/uL — ABNORMAL HIGH (ref 150–400)
RBC: 4.25 MIL/uL (ref 3.87–5.11)
RDW: 18.6 % — ABNORMAL HIGH (ref 11.5–15.5)
WBC Count: 24.5 10*3/uL — ABNORMAL HIGH (ref 4.0–10.5)
nRBC: 0 % (ref 0.0–0.2)

## 2021-07-14 LAB — CMP (CANCER CENTER ONLY)
ALT: 7 U/L (ref 0–44)
AST: 8 U/L — ABNORMAL LOW (ref 15–41)
Albumin: 3.1 g/dL — ABNORMAL LOW (ref 3.5–5.0)
Alkaline Phosphatase: 60 U/L (ref 38–126)
Anion gap: 11 (ref 5–15)
BUN: 37 mg/dL — ABNORMAL HIGH (ref 8–23)
CO2: 28 mmol/L (ref 22–32)
Calcium: 9.2 mg/dL (ref 8.9–10.3)
Chloride: 88 mmol/L — ABNORMAL LOW (ref 98–111)
Creatinine: 0.79 mg/dL (ref 0.44–1.00)
GFR, Estimated: >60 mL/min (ref >60)
Glucose, Bld: 175 mg/dL — ABNORMAL HIGH (ref 70–99)
Potassium: 4.4 mmol/L (ref 3.5–5.1)
Sodium: 127 mmol/L — ABNORMAL LOW (ref 135–145)
Total Bilirubin: 0.3 mg/dL (ref 0.3–1.2)
Total Protein: 5.9 g/dL — ABNORMAL LOW (ref 6.5–8.1)

## 2021-07-14 LAB — SAMPLE TO BLOOD BANK

## 2021-07-14 LAB — PREALBUMIN: Prealbumin: 20.4 mg/dL (ref 18–38)

## 2021-07-14 LAB — LACTATE DEHYDROGENASE: LDH: 110 U/L (ref 98–192)

## 2021-07-14 MED ORDER — FLUDROCORTISONE ACETATE 0.1 MG PO TABS: 0.2000 mg | ORAL_TABLET | Freq: Two times a day (BID) | ORAL | 3 refills | Status: DC

## 2021-07-14 MED ORDER — SODIUM CHLORIDE 0.9 % IV SOLN: INTRAVENOUS | Status: DC

## 2021-07-14 NOTE — Patient Instructions (Signed)
Dehydration, Adult Dehydration is condition in which there is not enough water or other fluids in the body. This happens when a person loses more fluids than he or she takes in. Important body parts cannot work right without the right amount of fluids. Any loss of fluids from the body can cause dehydration. Dehydration can be mild, worse, or very bad. It should be treated right away to keep it from getting very bad. What are the causes? This condition may be caused by: Conditions that cause loss of water or other fluids, such as: Watery poop (diarrhea). Vomiting. Sweating a lot. Peeing (urinating) a lot. Not drinking enough fluids, especially when you: Are ill. Are doing things that take a lot of energy to do. Other illnesses and conditions, such as fever or infection. Certain medicines, such as medicines that take extra fluid out of the body (diuretics). Lack of safe drinking water. Not being able to get enough water and food. What increases the risk? The following factors may make you more likely to develop this condition: Having a long-term (chronic) illness that has not been treated the right way, such as: Diabetes. Heart disease. Kidney disease. Being 47 years of age or older. Having a disability. Living in a place that is high above the ground or sea (high in altitude). The thinner, dried air causes more fluid loss. Doing exercises that put stress on your body for a long time. What are the signs or symptoms? Symptoms of dehydration depend on how bad it is. Mild or worse dehydration Thirst. Dry lips or dry mouth. Feeling dizzy or light-headed, especially when you stand up from sitting. Muscle cramps. Your body making: Dark pee (urine). Pee may be the color of tea. Less pee than normal. Less tears than normal. Headache. Very bad dehydration Changes in skin. Skin may: Be cold to the touch (clammy). Be blotchy or pale. Not go back to normal right after you lightly pinch  it and let it go. Little or no tears, pee, or sweat. Changes in vital signs, such as: Fast breathing. Low blood pressure. Weak pulse. Pulse that is more than 100 beats a minute when you are sitting still. Other changes, such as: Feeling very thirsty. Eyes that look hollow (sunken). Cold hands and feet. Being mixed up (confused). Being very tired (lethargic) or having trouble waking from sleep. Short-term weight loss. Loss of consciousness. How is this treated? Treatment for this condition depends on how bad it is. Treatment should start right away. Do not wait until your condition gets very bad. Very bad dehydration is an emergency. You will need to go to a hospital. Mild or worse dehydration can be treated at home. You may be asked to: Drink more fluids. Drink an oral rehydration solution (ORS). This drink helps get the right amounts of fluids and salts and minerals in the blood (electrolytes). Very bad dehydration can be treated: With fluids through an IV tube. By getting normal levels of salts and minerals in your blood. This is often done by giving salts and minerals through a tube. The tube is passed through your nose and into your stomach. By treating the root cause. Follow these instructions at home: Oral rehydration solution If told by your doctor, drink an ORS: Make an ORS. Use instructions on the package. Start by drinking small amounts, about  cup (120 mL) every 5-10 minutes. Slowly drink more until you have had the amount that your doctor said to have. Eating and drinking  Drink enough clear fluid to keep your pee pale yellow. If you were told to drink an ORS, finish the ORS first. Then, start slowly drinking other clear fluids. Drink fluids such as: Water. Do not drink only water. Doing that can make the salt (sodium) level in your body get too low. Water from ice chips you suck on. Fruit juice that you have added water to (diluted). Low-calorie sports  drinks. Eat foods that have the right amounts of salts and minerals, such as: Bananas. Oranges. Potatoes. Tomatoes. Spinach. Do not drink alcohol. Avoid: Drinks that have a lot of sugar. These include: High-calorie sports drinks. Fruit juice that you did not add water to. Soda. Caffeine. Foods that are greasy or have a lot of fat or sugar. General instructions Take over-the-counter and prescription medicines only as told by your doctor. Do not take salt tablets. Doing that can make the salt level in your body get too high. Return to your normal activities as told by your doctor. Ask your doctor what activities are safe for you. Keep all follow-up visits as told by your doctor. This is important. Contact a doctor if: You have pain in your belly (abdomen) and the pain: Gets worse. Stays in one place. You have a rash. You have a stiff neck. You get angry or annoyed (irritable) more easily than normal. You are more tired or have a harder time waking than normal. You feel: Weak or dizzy. Very thirsty. Get help right away if you have: Any symptoms of very bad dehydration. Symptoms of vomiting, such as: You cannot eat or drink without vomiting. Your vomiting gets worse or does not go away. Your vomit has blood or green stuff in it. Symptoms that get worse with treatment. A fever. A very bad headache. Problems with peeing or pooping (having a bowel movement), such as: Watery poop that gets worse or does not go away. Blood in your poop (stool). This may cause poop to look black and tarry. Not peeing in 6-8 hours. Peeing only a small amount of very dark pee in 6-8 hours. Trouble breathing. These symptoms may be an emergency. Do not wait to see if the symptoms will go away. Get medical help right away. Call your local emergency services (911 in the [place] Do not drive yourself to the hospital. Summary Dehydration is a condition in which there is not enough water or other fluids  in the body. This happens when a person loses more fluids than he or she takes in. Treatment for this condition depends on how bad it is. Treatment should be started right away. Do not wait until your condition gets very bad. Drink enough clear fluid to keep your pee pale yellow. If you were told to drink an oral rehydration solution (ORS), finish the ORS first. Then, start slowly drinking other clear fluids. Take over-the-counter and prescription medicines only as told by your doctor. Get help right away if you have any symptoms of very bad dehydration. This information is not intended to replace advice given to you by your health care provider. Make sure you discuss any questions you have with your health care provider. Document Revised: 01/10/2019 Document Reviewed: 01/10/2019 Elsevier Patient Education  [place].

## 2021-07-14 NOTE — Progress Notes (Signed)
Hematology and Oncology Follow Up Visit  Tami Stone [id number] 1950/08/19 71 y.o. 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 is back for follow-up.  Unfortunately, her decline continues.  She was in the hospital recently.  She had a large pericardial effusion.  I think she had over 1000 cc of fluid removed from the pericardial sac.  Of course, this was malignant.  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  She has the pleural effusion.  Again her disease is progressing.  Think we are at the point where we are not can be able to treat her.  We wanted to try to treat her with immunotherapy.  However, she was hospitalized.  I just do not think that immunotherapy will have a chance to work with her.  I just feel bad for her.  Her blood pressure today is 69/40.  She is dizzy.  She clearly is dehydrated.  I talked to her about what is going on.  Again, I think it is obvious that her malignancy is moving ahead.  Again she is declining.  Her performance status, at best, is ECOG 3.  I talked her about Hospice.  I really think that this would be the the best way for [place] to try to help her out now.  I think Hospice would be able to try to keep her at home.  I talked her about the possibility that she may need to be in the hospital.  She would prefer not to go back to the hospital since she has been in and out of the hospital quite often.  We will try to give her some IV fluid in the office.  I will try to put her on Florinef (0.2 mg p.o. twice daily) to try to help with her blood pressure.  She is able to eat a little bit.  Again with his  extrinsic compression of the esophagus, it is hard for her to eat a lot at 1 time.  She really has a hard time swallowing solid food.  She has always know what is best for her.  As such, she is always felt that she has made the best decisions for her treatment.  I have totally respected this.  When she was hospitalized, we did check a prealbumin on her.  It was only 8.4.  Again, we are at the point where we are going to have to try to think about her quality of life.   Medications:  Current Outpatient Medications:    fludrocortisone (FLORINEF) 0.1 MG tablet, Take 2 tablets (0.2 mg total) by mouth 2 (two) times daily., Disp: 60 tablet, Rfl: 3   acetaminophen (TYLENOL) 325 MG tablet, Take 2 tablets (650 mg total) by mouth every 4 (four) hours as needed for mild pain, moderate pain, fever or headache., Disp: 60 tablet, Rfl: 0   amiodarone (PACERONE) 200 MG tablet, Take 1 tablet (200 mg total) by mouth daily., Disp: 30 tablet, Rfl: 0   Black Pepper-Turmeric (TURMERIC PLUS BLACK PEPPER EXT PO), Take 1 capsule by mouth in the morning, at noon, and at bedtime., Disp: , Rfl:    Cholecalciferol (VITAMIN D) 125 MCG (5000 UT) CAPS,  Take 10,000 Units by mouth every other day., Disp: , Rfl:    Coenzyme Q10 (CO Q 10 PO), Take 300 mg by mouth at bedtime., Disp: , Rfl:    DANDELION PO, Take 1 tablet by mouth daily with supper., Disp: , Rfl:    dexamethasone (DECADRON) 4 MG tablet, Take 1 tablet (4 mg total) by mouth every 12 (twelve) hours., Disp: 60 tablet, Rfl: 0   docusate sodium (COLACE) 100 MG capsule, Take 1 capsule (100 mg total) by mouth 2 (two) times daily., Disp: 60 capsule, Rfl: 0   Flavoring Agent (APRICOT FLAVOR) POWD, Take 2 capsules by mouth daily. Apricot Seed Supplement, Disp: , Rfl:    gabapentin (NEURONTIN) 300 MG capsule, Take 1 capsule (300 mg total) by mouth at bedtime., Disp: 30 capsule, Rfl: 0   IODINE, KELP, PO, Take 1 capsule by mouth in the morning., Disp: , Rfl:    lacosamide  (VIMPAT) 50 MG TABS tablet, Take 1 tablet (50 mg total) by mouth 2 (two) times daily. (Patient not taking: Reported on 06/30/2021), Disp: 60 tablet, Rfl: 0   lactulose (CHRONULAC) 10 GM/15ML solution, Take 30 mLs (20 g total) by mouth 2 (two) times daily., Disp: 236 mL, Rfl: 0   letrozole (FEMARA) 2.5 MG tablet, Take 1 tablet (2.5 mg total) by mouth daily., Disp: 30 tablet, Rfl: 2   NP THYROID 60 MG tablet, Take 60 mg by mouth daily., Disp: , Rfl:    polyethylene glycol (MIRALAX / GLYCOLAX) 17 g packet, Take 17 g by mouth daily., Disp: 14 each, Rfl: 0   Prenatal Vit-Fe Fumarate-FA (MULTIVITAMIN-PRENATAL) 27-0.8 MG TABS tablet, Take 1 tablet by mouth daily at 12 noon., Disp: , Rfl:    Zinc 100 MG TABS, Take 100 mg by mouth daily., Disp: , Rfl:  No current facility-administered medications for this visit.  Facility-Administered Medications Ordered in Other Visits:    0.9 %  sodium chloride infusion, , Intravenous, Continuous, Sabastien Tyler, Rudell Cobb, MD  Allergies: No Known Allergies  Past Medical History, Surgical history, Social history, and Family History were reviewed and updated.  Review of Systems: Review of Systems  Constitutional:  Positive for fatigue.  HENT:  Negative.    Eyes: Negative.   Respiratory:  Positive for cough.   Cardiovascular: Negative.   Gastrointestinal: Negative.   Endocrine: Positive for hot flashes.  Genitourinary: Negative.    Musculoskeletal:  Positive for arthralgias and myalgias.  Skin: Negative.   Neurological:  Positive for dizziness and light-headedness.  Hematological: Negative.   Psychiatric/Behavioral: Negative.     Physical Exam:  oral temperature is 97.7 F (36.5 C). Her blood pressure is 69/40 (abnormal) and her pulse is 87. Her respiration is 17 and oxygen saturation is 95%.   Wt Readings from Last 3 Encounters:  07/08/21 102 lb 1.2 oz (46.3 kg)  06/23/21 110 lb (49.9 kg)  06/17/21 116 lb (52.6 kg)    Physical Exam Vitals reviewed.  HENT:      Head: Normocephalic and atraumatic.  Eyes:     Pupils: Pupils are equal, round, and reactive to light.  Cardiovascular:     Rate and Rhythm: Normal rate and regular rhythm.     Heart sounds: Normal heart sounds.  Pulmonary:     Effort: Pulmonary effort is normal.     Breath sounds: Normal breath sounds.     Comments: Her lungs sound relatively clear on the right side.  There is some decrease on the left side.  I do not  hear any wheezing.  There is no rhonchi. Abdominal:     General: Bowel sounds are normal.     Palpations: Abdomen is soft.  Musculoskeletal:        General: No tenderness or deformity. Normal range of motion.     Cervical back: Normal range of motion.  Lymphadenopathy:     Cervical: No cervical adenopathy.  Skin:    General: Skin is warm and dry.     Findings: No erythema or rash.     Comments: Under the left breast at about the 5-6 o'clock position, there is a nodule.  It is mobile.  It is nontender.  Probably measures about 8 x 4 mm.  Neurological:     Mental Status: She is alert and oriented to person, place, and time.  Psychiatric:        Behavior: Behavior normal.        Thought Content: Thought content normal.        Judgment: Judgment normal.     Lab Results  Component Value Date   WBC 24.5 (H) 07/14/2021   HGB 11.6 (L) 07/14/2021   HCT 35.9 (L) 07/14/2021   MCV 84.5 07/14/2021   PLT 565 (H) 07/14/2021     Chemistry      Component Value Date/Time   NA 127 (L) 07/14/2021 1455   K 4.4 07/14/2021 1455   CL 88 (L) 07/14/2021 1455   CO2 28 07/14/2021 1455   BUN 37 (H) 07/14/2021 1455   CREATININE 0.79 07/14/2021 1455      Component Value Date/Time   CALCIUM 9.2 07/14/2021 1455   ALKPHOS 60 07/14/2021 1455   AST 8 (L) 07/14/2021 1455   ALT 7 07/14/2021 1455   BILITOT 0.3 07/14/2021 1455      Impression and Plan: Ms. Tuch is a very nice 71 year old white female.  At this point, she is succumbing to her metastatic lung cancer.  This  is clearly aggressive.  She had the pleural effusion.  She actually had to have the Pleurx catheter removed because it would just was not draining fluid.  She had a pericardiocentesis.  This took out 1000 cc of fluid.  She did develop atrial fibrillation.  She is on amiodarone right now which is keeping her rhythm in check.  Again her performance status has declined significantly.  She just is no candidate for systemic therapy at this point.  I just want to try to help her quality of life.  I just want to try to give her some respect and dignity.  I talked to she and her husband about Hospice.  I think they are going to have to think about this.  I will also have to talk to her about end-of-life issues and what her wishes are for life support.  Hopefully, the Florinef will help.  I would like to see her back within a week.  Hopefully, she will not end up in the hospital before we see her back.    Volanda Napoleon, MD 2/1/20234:08 PM

## 2021-07-15 ENCOUNTER — Telehealth: Payer: Self-pay

## 2021-07-15 ENCOUNTER — Telehealth: Payer: Self-pay | Admitting: *Deleted

## 2021-07-15 DIAGNOSIS — E039 Hypothyroidism, unspecified: Secondary | ICD-10-CM

## 2021-07-15 LAB — TSH: TSH: 17.91 u[IU]/mL — ABNORMAL HIGH (ref 0.308–3.960)

## 2021-07-15 MED ORDER — LEVOTHYROXINE SODIUM 50 MCG PO TABS: 50.0000 ug | ORAL_TABLET | Freq: Every day | ORAL | 1 refills | Status: DC

## 2021-07-15 NOTE — Telephone Encounter (Signed)
Spoke with patient regarding Palliative Care referral. She requested a call back next week to discuss services.

## 2021-07-15 NOTE — Telephone Encounter (Signed)
-----   Message from Volanda Napoleon, MD sent at 07/15/2021  1:06 PM EST ----- Call - your thyroid is very low!!  Your "natural" thyroid replacement is not working!!  We need to get you on Synthroid to help it -- this will help your BP!!!  Please call in Synthroid 50 mcg po q day!!!  Laurey Arrow

## 2021-07-15 NOTE — Telephone Encounter (Signed)
As noted below by Dr. Marin Olp, I informed the patient that her thyroid is low. We need to start you on Synthroid 62mcg by mouth a day. She verbalized understanding.

## 2021-07-20 ENCOUNTER — Inpatient Hospital Stay (HOSPITAL_BASED_OUTPATIENT_CLINIC_OR_DEPARTMENT_OTHER): Payer: Medicare Other | Admitting: Family

## 2021-07-20 ENCOUNTER — Inpatient Hospital Stay: Payer: Medicare Other

## 2021-07-20 ENCOUNTER — Other Ambulatory Visit: Payer: Self-pay

## 2021-07-20 VITALS — BP 94/62 | HR 92 | Temp 98.1°F | Resp 19 | Ht 62.0 in

## 2021-07-20 DIAGNOSIS — C3492 Malignant neoplasm of unspecified part of left bronchus or lung: Secondary | ICD-10-CM

## 2021-07-20 DIAGNOSIS — E860 Dehydration: Secondary | ICD-10-CM

## 2021-07-20 DIAGNOSIS — E039 Hypothyroidism, unspecified: Secondary | ICD-10-CM

## 2021-07-20 DIAGNOSIS — E871 Hypo-osmolality and hyponatremia: Secondary | ICD-10-CM | POA: Diagnosis not present

## 2021-07-20 LAB — CBC WITH DIFFERENTIAL (CANCER CENTER ONLY)
Abs Immature Granulocytes: 0.26 10*3/uL — ABNORMAL HIGH (ref 0.00–0.07)
Basophils Absolute: 0.1 10*3/uL (ref 0.0–0.1)
Basophils Relative: 0 %
Eosinophils Absolute: 0.3 10*3/uL (ref 0.0–0.5)
Eosinophils Relative: 1 %
HCT: 37.5 % (ref 36.0–46.0)
Hemoglobin: 12 g/dL (ref 12.0–15.0)
Immature Granulocytes: 1 %
Lymphocytes Relative: 7 %
Lymphs Abs: 1.6 10*3/uL (ref 0.7–4.0)
MCH: 27.3 pg (ref 26.0–34.0)
MCHC: 32 g/dL (ref 30.0–36.0)
MCV: 85.4 fL (ref 80.0–100.0)
Monocytes Absolute: 0.6 10*3/uL (ref 0.1–1.0)
Monocytes Relative: 3 %
Neutro Abs: 20.3 10*3/uL — ABNORMAL HIGH (ref 1.7–7.7)
Neutrophils Relative %: 88 %
Platelet Count: 485 10*3/uL — ABNORMAL HIGH (ref 150–400)
RBC: 4.39 MIL/uL (ref 3.87–5.11)
RDW: 18.7 % — ABNORMAL HIGH (ref 11.5–15.5)
WBC Count: 23.1 10*3/uL — ABNORMAL HIGH (ref 4.0–10.5)
nRBC: 0 % (ref 0.0–0.2)

## 2021-07-20 LAB — CMP (CANCER CENTER ONLY)
ALT: 10 U/L (ref 0–44)
AST: 8 U/L — ABNORMAL LOW (ref 15–41)
Albumin: 3.1 g/dL — ABNORMAL LOW (ref 3.5–5.0)
Alkaline Phosphatase: 70 U/L (ref 38–126)
Anion gap: 7 (ref 5–15)
BUN: 16 mg/dL (ref 8–23)
CO2: 33 mmol/L — ABNORMAL HIGH (ref 22–32)
Calcium: 8.7 mg/dL — ABNORMAL LOW (ref 8.9–10.3)
Chloride: 89 mmol/L — ABNORMAL LOW (ref 98–111)
Creatinine: 0.41 mg/dL — ABNORMAL LOW (ref 0.44–1.00)
GFR, Estimated: >60 mL/min (ref >60)
Glucose, Bld: 135 mg/dL — ABNORMAL HIGH (ref 70–99)
Potassium: 4.1 mmol/L (ref 3.5–5.1)
Sodium: 129 mmol/L — ABNORMAL LOW (ref 135–145)
Total Bilirubin: 0.3 mg/dL (ref 0.3–1.2)
Total Protein: 5.8 g/dL — ABNORMAL LOW (ref 6.5–8.1)

## 2021-07-20 LAB — LACTATE DEHYDROGENASE: LDH: 116 U/L (ref 98–192)

## 2021-07-20 LAB — PREALBUMIN: Prealbumin: 18.9 mg/dL (ref 18–38)

## 2021-07-20 MED ORDER — SODIUM CHLORIDE 0.9 % IV SOLN: INTRAVENOUS | Status: DC

## 2021-07-20 NOTE — Patient Instructions (Signed)
Dehydration, Adult Dehydration is condition in which there is not enough water or other fluids in the body. This happens when a person loses more fluids than he or she takes in. Important body parts cannot work right without the right amount of fluids. Any loss of fluids from the body can cause dehydration. Dehydration can be mild, worse, or very bad. It should be treated right away to keep it from getting very bad. What are the causes? This condition may be caused by: Conditions that cause loss of water or other fluids, such as: Watery poop (diarrhea). Vomiting. Sweating a lot. Peeing (urinating) a lot. Not drinking enough fluids, especially when you: Are ill. Are doing things that take a lot of energy to do. Other illnesses and conditions, such as fever or infection. Certain medicines, such as medicines that take extra fluid out of the body (diuretics). Lack of safe drinking water. Not being able to get enough water and food. What increases the risk? The following factors may make you more likely to develop this condition: Having a long-term (chronic) illness that has not been treated the right way, such as: Diabetes. Heart disease. Kidney disease. Being 12 years of age or older. Having a disability. Living in a place that is high above the ground or sea (high in altitude). The thinner, dried air causes more fluid loss. Doing exercises that put stress on your body for a long time. What are the signs or symptoms? Symptoms of dehydration depend on how bad it is. Mild or worse dehydration Thirst. Dry lips or dry mouth. Feeling dizzy or light-headed, especially when you stand up from sitting. Muscle cramps. Your body making: Dark pee (urine). Pee may be the color of tea. Less pee than normal. Less tears than normal. Headache. Very bad dehydration Changes in skin. Skin may: Be cold to the touch (clammy). Be blotchy or pale. Not go back to normal right after you lightly pinch  it and let it go. Little or no tears, pee, or sweat. Changes in vital signs, such as: Fast breathing. Low blood pressure. Weak pulse. Pulse that is more than 100 beats a minute when you are sitting still. Other changes, such as: Feeling very thirsty. Eyes that look hollow (sunken). Cold hands and feet. Being mixed up (confused). Being very tired (lethargic) or having trouble waking from sleep. Short-term weight loss. Loss of consciousness. How is this treated? Treatment for this condition depends on how bad it is. Treatment should start right away. Do not wait until your condition gets very bad. Very bad dehydration is an emergency. You will need to go to a hospital. Mild or worse dehydration can be treated at home. You may be asked to: Drink more fluids. Drink an oral rehydration solution (ORS). This drink helps get the right amounts of fluids and salts and minerals in the blood (electrolytes). Very bad dehydration can be treated: With fluids through an IV tube. By getting normal levels of salts and minerals in your blood. This is often done by giving salts and minerals through a tube. The tube is passed through your nose and into your stomach. By treating the root cause. Follow these instructions at home: Oral rehydration solution If told by your doctor, drink an ORS: Make an ORS. Use instructions on the package. Start by drinking small amounts, about  cup (120 mL) every 5-10 minutes. Slowly drink more until you have had the amount that your doctor said to have. Eating and drinking  Drink enough clear fluid to keep your pee pale yellow. If you were told to drink an ORS, finish the ORS first. Then, start slowly drinking other clear fluids. Drink fluids such as: Water. Do not drink only water. Doing that can make the salt (sodium) level in your body get too low. Water from ice chips you suck on. Fruit juice that you have added water to (diluted). Low-calorie sports  drinks. Eat foods that have the right amounts of salts and minerals, such as: Bananas. Oranges. Potatoes. Tomatoes. Spinach. Do not drink alcohol. Avoid: Drinks that have a lot of sugar. These include: High-calorie sports drinks. Fruit juice that you did not add water to. Soda. Caffeine. Foods that are greasy or have a lot of fat or sugar. General instructions Take over-the-counter and prescription medicines only as told by your doctor. Do not take salt tablets. Doing that can make the salt level in your body get too high. Return to your normal activities as told by your doctor. Ask your doctor what activities are safe for you. Keep all follow-up visits as told by your doctor. This is important. Contact a doctor if: You have pain in your belly (abdomen) and the pain: Gets worse. Stays in one place. You have a rash. You have a stiff neck. You get angry or annoyed (irritable) more easily than normal. You are more tired or have a harder time waking than normal. You feel: Weak or dizzy. Very thirsty. Get help right away if you have: Any symptoms of very bad dehydration. Symptoms of vomiting, such as: You cannot eat or drink without vomiting. Your vomiting gets worse or does not go away. Your vomit has blood or green stuff in it. Symptoms that get worse with treatment. A fever. A very bad headache. Problems with peeing or pooping (having a bowel movement), such as: Watery poop that gets worse or does not go away. Blood in your poop (stool). This may cause poop to look black and tarry. Not peeing in 6-8 hours. Peeing only a small amount of very dark pee in 6-8 hours. Trouble breathing. These symptoms may be an emergency. Do not wait to see if the symptoms will go away. Get medical help right away. Call your local emergency services (911 in the [place] Do not drive yourself to the hospital. Summary Dehydration is a condition in which there is not enough water or other fluids  in the body. This happens when a person loses more fluids than he or she takes in. Treatment for this condition depends on how bad it is. Treatment should be started right away. Do not wait until your condition gets very bad. Drink enough clear fluid to keep your pee pale yellow. If you were told to drink an oral rehydration solution (ORS), finish the ORS first. Then, start slowly drinking other clear fluids. Take over-the-counter and prescription medicines only as told by your doctor. Get help right away if you have any symptoms of very bad dehydration. This information is not intended to replace advice given to you by your health care provider. Make sure you discuss any questions you have with your health care provider. Document Revised: 01/10/2019 Document Reviewed: 01/10/2019 Elsevier Patient Education  [employer].

## 2021-07-20 NOTE — Progress Notes (Signed)
Hematology and Oncology Follow Up Visit  Ibeth Fahmy [id number] 31-Dec-1950 71 y.o. 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 Greenlaw is here today with her husband for follow-up. She is quite weak and fatigued. She notes SOB with any exertion and while sitting.  No fever, chills, n/v, cough, rash, dizziness, SOB, chest pain, palpitations, abdominal pain or changes in bowel or bladder habits.  She has mild swelling in her feet and ankles. 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  No falls or syncope reported.  No blood loss noted. No petechiae.  Her appetite comes and goes. She states that it depends on if her esophagus is tight what and when she can eat. She is doing her best to stay well hydrated. She could not stand for a weight today.   ECOG Performance Status: 3 - Symptomatic, >50% confined to bed  Medications:  Allergies as of 07/20/2021   No Known Allergies      Medication List        Accurate as of July 20, 2021  2:56 PM. If you have any questions, ask your nurse or doctor.          acetaminophen 325 MG tablet Commonly known as: TYLENOL Take 2 tablets (650 mg total) by mouth every 4 (four) hours as needed for mild pain, moderate pain, fever or headache.   amiodarone 200 MG tablet Commonly known as: PACERONE Take 1 tablet (200 mg total) by mouth daily.   Apricot Flavor Powd Take 2 capsules by mouth daily. Apricot Seed Supplement   CO Q 10 PO Take 300 mg by mouth at bedtime.   DANDELION PO Take 1 tablet by mouth daily with supper.   dexamethasone 4 MG tablet Commonly known as: DECADRON Take 1 tablet (4 mg total) by mouth every 12  (twelve) hours.   docusate sodium 100 MG capsule Commonly known as: COLACE Take 1 capsule (100 mg total) by mouth 2 (two) times daily.   fludrocortisone 0.1 MG tablet Commonly known as: FLORINEF Take 2 tablets (0.2 mg total) by mouth 2 (two) times daily.   gabapentin 300 MG capsule Commonly known as: NEURONTIN Take 1 capsule (300 mg total) by mouth at bedtime.   IODINE (KELP) PO Take 1 capsule by mouth in the morning.   lacosamide 50 MG Tabs tablet Commonly known as: VIMPAT Take 1 tablet (50 mg total) by mouth 2 (two) times daily.   lactulose 10 GM/15ML solution Commonly known as: CHRONULAC Take 30 mLs (20 g total) by mouth 2 (two) times daily.   letrozole 2.5 MG tablet Commonly known as: FEMARA Take 1 tablet (2.5 mg total) by mouth daily.   levothyroxine 50 MCG tablet Commonly known as: Synthroid Take 1 tablet (50 mcg total) by mouth daily before breakfast.   multivitamin-prenatal 27-0.8 MG Tabs tablet Take 1 tablet by mouth daily at 12 noon.   polyethylene glycol 17 g packet Commonly known as: MIRALAX / GLYCOLAX Take 17 g by mouth daily.   TURMERIC PLUS BLACK PEPPER EXT PO Take 1 capsule by mouth in the morning, at noon, and at bedtime.   Vitamin D 125 MCG (5000 UT) Caps Take 10,000 Units by  mouth every other day.   Zinc 100 MG Tabs Take 100 mg by mouth daily.        Allergies: No Known Allergies  Past Medical History, Surgical history, Social history, and Family History were reviewed and updated.  Review of Systems: All other 10 point review of systems is negative.   Physical Exam:  vitals were not taken for this visit.   Wt Readings from Last 3 Encounters:  07/08/21 102 lb 1.2 oz (46.3 kg)  06/23/21 110 lb (49.9 kg)  06/17/21 116 lb (52.6 kg)    Ocular: Sclerae unicteric, pupils equal, round and reactive to light Ear-nose-throat: Oropharynx clear, dentition fair Lymphatic: No cervical or supraclavicular adenopathy Lungs no rales or rhonchi,  good excursion bilaterally Heart regular rate and rhythm, no murmur appreciated Abd soft, nontender, positive bowel sounds MSK no focal spinal tenderness, no joint edema Neuro: non-focal, well-oriented, appropriate affect Breasts: Deferred   Lab Results  Component Value Date   WBC 23.1 (H) 07/20/2021   HGB 12.0 07/20/2021   HCT 37.5 07/20/2021   MCV 85.4 07/20/2021   PLT 485 (H) 07/20/2021   Lab Results  Component Value Date   FERRITIN 515 (H) 06/02/2021   IRON 15 (L) 06/25/2021   TIBC 146 (L) 06/25/2021   UIBC 131 06/25/2021   IRONPCTSAT 10 (L) 06/25/2021   Lab Results  Component Value Date   RETICCTPCT 2.0 06/02/2021   RBC 4.39 07/20/2021   No results found for: KPAFRELGTCHN, LAMBDASER, KAPLAMBRATIO No results found for: IGGSERUM, IGA, IGMSERUM No results found for: TOTALPROTELP, ALBUMINELP, A1GS, A2GS, BETS, BETA2SER, GAMS, MSPIKE, SPEI   Chemistry      Component Value Date/Time   NA 127 (L) 07/14/2021 1455   K 4.4 07/14/2021 1455   CL 88 (L) 07/14/2021 1455   CO2 28 07/14/2021 1455   BUN 37 (H) 07/14/2021 1455   CREATININE 0.79 07/14/2021 1455      Component Value Date/Time   CALCIUM 9.2 07/14/2021 1455   ALKPHOS 60 07/14/2021 1455   AST 8 (L) 07/14/2021 1455   ALT 7 07/14/2021 1455   BILITOT 0.3 07/14/2021 1455       Impression and Plan: Mr. Vena Austria is a pleasant 71 yo caucasian female with metastatic lung cancer, progressive. She continues to decline.  We will give IV fluids today.  She has a red spot on her coccyx that blanches with pressure. No lesion or break in her skin. She has been using an over the counter cream and will try moving side to side to take the pressure off her tailbone.  TSH and prealbumin are pending.  Follow-up in 3 weeks. They can call with any questions or concerns.   Lottie Dawson, NP 2/7/20232:56 PM

## 2021-07-20 NOTE — Patient Instructions (Signed)

## 2021-07-21 ENCOUNTER — Telehealth: Payer: Self-pay | Admitting: Pulmonary Disease

## 2021-07-21 ENCOUNTER — Telehealth: Payer: Self-pay

## 2021-07-21 LAB — TSH: TSH: 14.026 u[IU]/mL — ABNORMAL HIGH (ref 0.308–3.960)

## 2021-07-21 NOTE — Telephone Encounter (Signed)
Advised pt via MyChart.

## 2021-07-21 NOTE — Telephone Encounter (Signed)
-----   Message from Volanda Napoleon, MD sent at 07/21/2021 11:58 AM EST ----- Please call her and let her know that the TSH is a little bit better.  We just need to keep her on the Synthroid.

## 2021-07-21 NOTE — Telephone Encounter (Addendum)
ATC patient because Dr. Valeta Harms needs to reschedule her appt for Friday at 12 due to him having a meeting. Patient can be rescheduled with APP for Friday or moved to a different day.      When patient calls back please reschedule her

## 2021-07-22 NOTE — Telephone Encounter (Signed)
Patient was not able to come in on Friday at a different time and has been rescheduled. Nothing further needed at this time.   Next Appt With Pulmonology Clayton Bibles, NP) 07/27/2021 at 12:00 PM

## 2021-07-23 ENCOUNTER — Ambulatory Visit: Payer: Medicare Other | Admitting: Pulmonary Disease

## 2021-07-23 ENCOUNTER — Telehealth: Payer: Self-pay

## 2021-07-23 ENCOUNTER — Other Ambulatory Visit: Payer: Medicare Other

## 2021-07-23 ENCOUNTER — Inpatient Hospital Stay (HOSPITAL_COMMUNITY)
Admission: EM | Admit: 2021-07-23 | Discharge: 2021-07-26 | DRG: 180 | Disposition: A | Discharge status: Hospice | Payer: Medicare Other | Attending: Internal Medicine | Admitting: Internal Medicine

## 2021-07-23 ENCOUNTER — Emergency Department (HOSPITAL_COMMUNITY): Payer: Medicare Other

## 2021-07-23 DIAGNOSIS — Z803 Family history of malignant neoplasm of breast: Secondary | ICD-10-CM

## 2021-07-23 DIAGNOSIS — E039 Hypothyroidism, unspecified: Secondary | ICD-10-CM | POA: Diagnosis present

## 2021-07-23 DIAGNOSIS — E43 Unspecified severe protein-calorie malnutrition: Secondary | ICD-10-CM | POA: Diagnosis not present

## 2021-07-23 DIAGNOSIS — E7800 Pure hypercholesterolemia, unspecified: Secondary | ICD-10-CM | POA: Diagnosis present

## 2021-07-23 DIAGNOSIS — E1140 Type 2 diabetes mellitus with diabetic neuropathy, unspecified: Secondary | ICD-10-CM | POA: Diagnosis present

## 2021-07-23 DIAGNOSIS — J90 Pleural effusion, not elsewhere classified: Secondary | ICD-10-CM | POA: Diagnosis present

## 2021-07-23 DIAGNOSIS — C7951 Secondary malignant neoplasm of bone: Secondary | ICD-10-CM | POA: Diagnosis present

## 2021-07-23 DIAGNOSIS — Z170 Estrogen receptor positive status [ER+]: Secondary | ICD-10-CM

## 2021-07-23 DIAGNOSIS — I9589 Other hypotension: Secondary | ICD-10-CM | POA: Diagnosis not present

## 2021-07-23 DIAGNOSIS — I959 Hypotension, unspecified: Secondary | ICD-10-CM | POA: Diagnosis present

## 2021-07-23 DIAGNOSIS — C50512 Malignant neoplasm of lower-outer quadrant of left female breast: Secondary | ICD-10-CM | POA: Diagnosis present

## 2021-07-23 DIAGNOSIS — R64 Cachexia: Secondary | ICD-10-CM | POA: Diagnosis present

## 2021-07-23 DIAGNOSIS — C7931 Secondary malignant neoplasm of brain: Secondary | ICD-10-CM | POA: Diagnosis present

## 2021-07-23 DIAGNOSIS — Z79899 Other long term (current) drug therapy: Secondary | ICD-10-CM

## 2021-07-23 DIAGNOSIS — J910 Malignant pleural effusion: Secondary | ICD-10-CM | POA: Diagnosis present

## 2021-07-23 DIAGNOSIS — R627 Adult failure to thrive: Secondary | ICD-10-CM | POA: Diagnosis present

## 2021-07-23 DIAGNOSIS — E871 Hypo-osmolality and hyponatremia: Secondary | ICD-10-CM | POA: Diagnosis present

## 2021-07-23 DIAGNOSIS — Z90710 Acquired absence of both cervix and uterus: Secondary | ICD-10-CM

## 2021-07-23 DIAGNOSIS — Z7189 Other specified counseling: Secondary | ICD-10-CM | POA: Diagnosis not present

## 2021-07-23 DIAGNOSIS — Z515 Encounter for palliative care: Secondary | ICD-10-CM

## 2021-07-23 DIAGNOSIS — Z79811 Long term (current) use of aromatase inhibitors: Secondary | ICD-10-CM

## 2021-07-23 DIAGNOSIS — E8809 Other disorders of plasma-protein metabolism, not elsewhere classified: Secondary | ICD-10-CM

## 2021-07-23 DIAGNOSIS — I4891 Unspecified atrial fibrillation: Secondary | ICD-10-CM | POA: Diagnosis present

## 2021-07-23 DIAGNOSIS — Z66 Do not resuscitate: Secondary | ICD-10-CM | POA: Diagnosis present

## 2021-07-23 DIAGNOSIS — I471 Supraventricular tachycardia: Secondary | ICD-10-CM | POA: Diagnosis not present

## 2021-07-23 DIAGNOSIS — G936 Cerebral edema: Secondary | ICD-10-CM | POA: Diagnosis present

## 2021-07-23 DIAGNOSIS — C3492 Malignant neoplasm of unspecified part of left bronchus or lung: Principal | ICD-10-CM

## 2021-07-23 DIAGNOSIS — R1310 Dysphagia, unspecified: Secondary | ICD-10-CM | POA: Diagnosis not present

## 2021-07-23 DIAGNOSIS — E861 Hypovolemia: Secondary | ICD-10-CM | POA: Diagnosis not present

## 2021-07-23 DIAGNOSIS — Z79890 Hormone replacement therapy (postmenopausal): Secondary | ICD-10-CM

## 2021-07-23 DIAGNOSIS — Z20822 Contact with and (suspected) exposure to covid-19: Secondary | ICD-10-CM | POA: Diagnosis present

## 2021-07-23 DIAGNOSIS — C3490 Malignant neoplasm of unspecified part of unspecified bronchus or lung: Secondary | ICD-10-CM | POA: Diagnosis not present

## 2021-07-23 DIAGNOSIS — R0600 Dyspnea, unspecified: Secondary | ICD-10-CM | POA: Diagnosis not present

## 2021-07-23 DIAGNOSIS — Z6821 Body mass index (BMI) 21.0-21.9, adult: Secondary | ICD-10-CM

## 2021-07-23 DIAGNOSIS — I3139 Other pericardial effusion (noninflammatory): Secondary | ICD-10-CM | POA: Diagnosis not present

## 2021-07-23 LAB — COMPREHENSIVE METABOLIC PANEL
ALT: 9 U/L (ref 0–44)
AST: 10 U/L — ABNORMAL LOW (ref 15–41)
Albumin: 2.4 g/dL — ABNORMAL LOW (ref 3.5–5.0)
Alkaline Phosphatase: 53 U/L (ref 38–126)
Anion gap: 12 (ref 5–15)
BUN: 15 mg/dL (ref 8–23)
CO2: 20 mmol/L — ABNORMAL LOW (ref 22–32)
Calcium: 8.2 mg/dL — ABNORMAL LOW (ref 8.9–10.3)
Chloride: 96 mmol/L — ABNORMAL LOW (ref 98–111)
Creatinine, Ser: 0.43 mg/dL — ABNORMAL LOW (ref 0.44–1.00)
GFR, Estimated: >60 mL/min (ref >60)
Glucose, Bld: 90 mg/dL (ref 70–99)
Potassium: 4.3 mmol/L (ref 3.5–5.1)
Sodium: 128 mmol/L — ABNORMAL LOW (ref 135–145)
Total Bilirubin: 0.5 mg/dL (ref 0.3–1.2)
Total Protein: 5.3 g/dL — ABNORMAL LOW (ref 6.5–8.1)

## 2021-07-23 LAB — CBC
HCT: 36.2 % (ref 36.0–46.0)
Hemoglobin: 11.3 g/dL — ABNORMAL LOW (ref 12.0–15.0)
MCH: 27.2 pg (ref 26.0–34.0)
MCHC: 31.2 g/dL (ref 30.0–36.0)
MCV: 87.2 fL (ref 80.0–100.0)
Platelets: 337 10*3/uL (ref 150–400)
RBC: 4.15 MIL/uL (ref 3.87–5.11)
RDW: 19.3 % — ABNORMAL HIGH (ref 11.5–15.5)
WBC: 20 10*3/uL — ABNORMAL HIGH (ref 4.0–10.5)
nRBC: 0 % (ref 0.0–0.2)

## 2021-07-23 LAB — TROPONIN I (HIGH SENSITIVITY): Troponin I (High Sensitivity): 6 ng/L (ref <18)

## 2021-07-23 LAB — BRAIN NATRIURETIC PEPTIDE: B Natriuretic Peptide: 122.2 pg/mL — ABNORMAL HIGH (ref 0.0–100.0)

## 2021-07-23 IMAGING — DX DG CHEST 1V PORT
1 series · 1 of 1 positions shown · non-contrast
Comparison: Portable chest [DATE], chest CT [DATE].

CLINICAL DATA: Stage IV lung cancer patient with large encompassing
left pleural effusion with PleurX drainage catheter in place,
returns with shortness of breath and chest pain.

EXAM:
PORTABLE CHEST 1 VIEW

[chest ap]
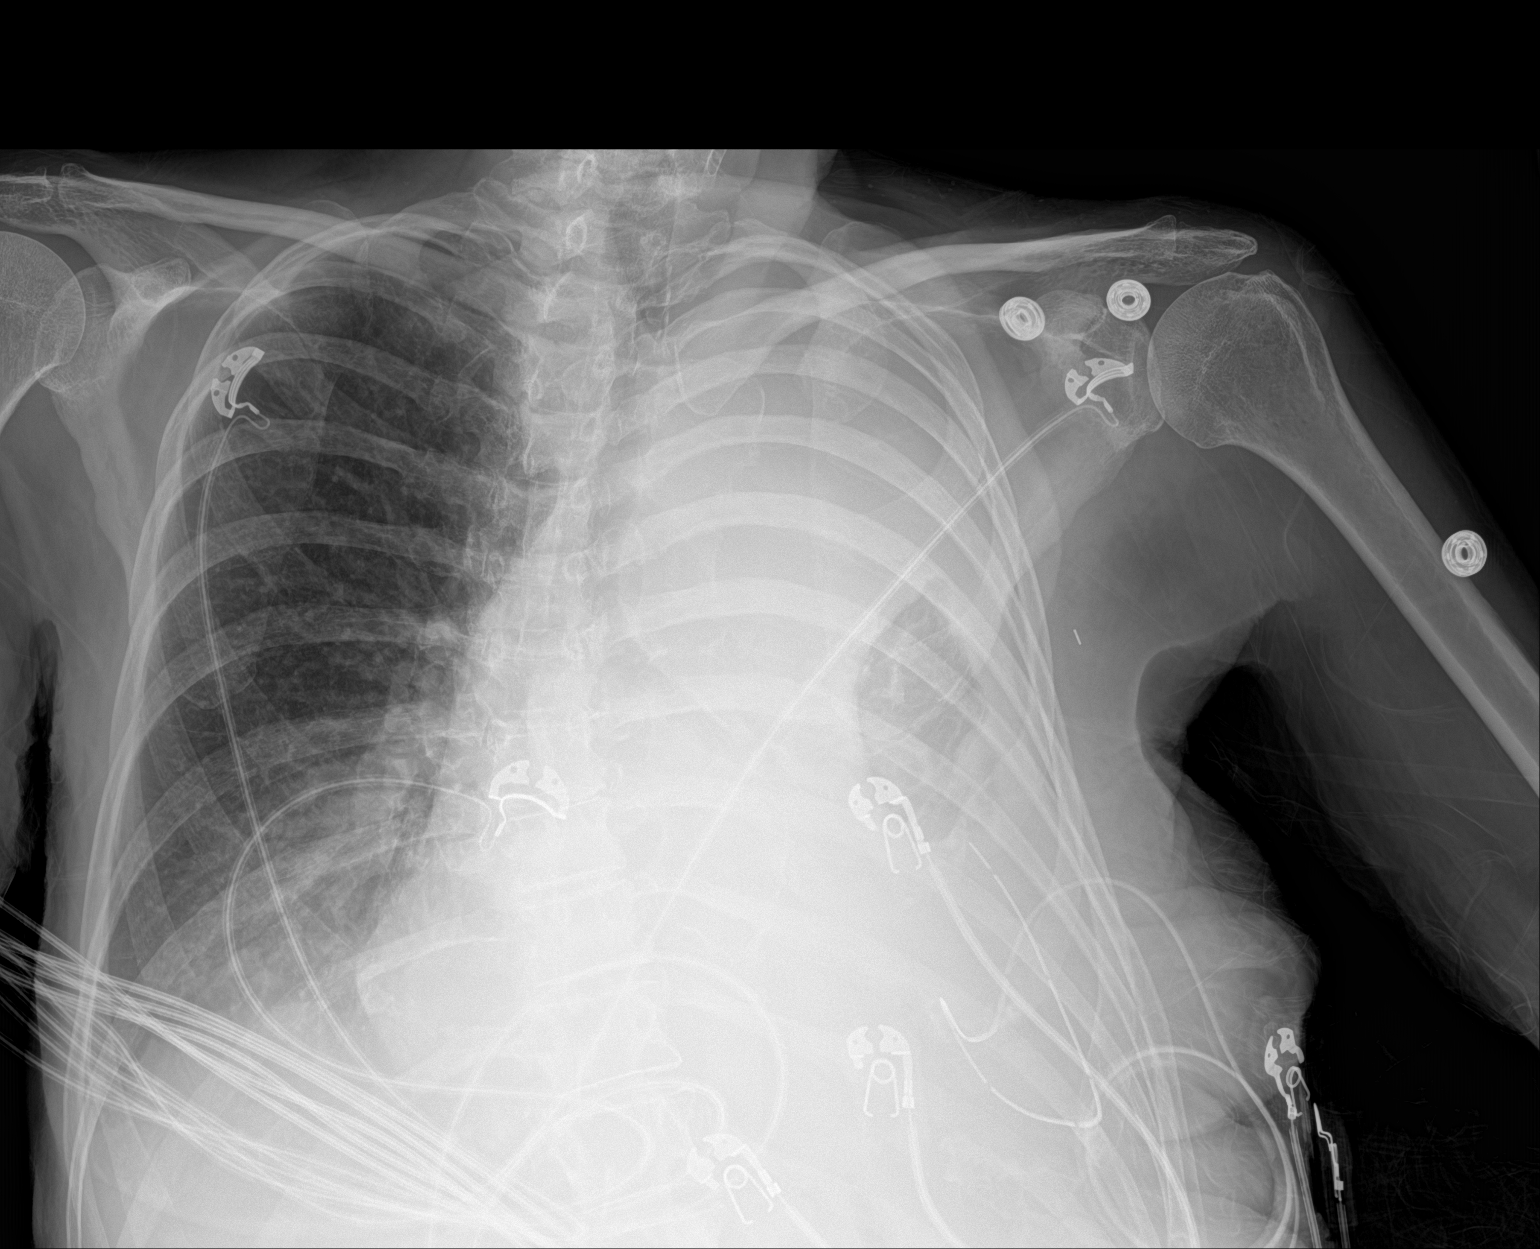

[1 of 1 positions shown; findings below may reference images not displayed]

FINDINGS: Left basilar PleurX drainage catheter remains coiled in the lower
lateral left chest.

Large circumferential left pleural effusion has probably at least
mildly enlarged compared to the prior study. There is only a small
area of preserved aeration in the lateral left mid lung, smaller
than previously.

The heart silhouette is obscured on the left. Central vessels
visible on the right are normal caliber and a small right pleural
effusion appears increased with increased hazy consolidation or
atelectasis in the right base.

Right lung fields are otherwise clear. There are numerous overlying
monitor wires on the right. Thoracic cage grossly intact.
IMPRESSION: 1. PleurX drainage catheter lower lateral left chest unchanged in
position with large left pleural effusion likely at least mildly
increased today.
2. Increasing small right pleural effusion and hazy consolidation or
atelectasis in the right base.
3. The cardiac silhouette obscured on the left. Central vessels are
normal caliber.

## 2021-07-23 MED ORDER — ONDANSETRON HCL 4 MG/2ML IJ SOLN
4.0000 mg | Freq: Once | INTRAMUSCULAR | Status: AC
  Administered 2021-07-24: 4 mg via INTRAVENOUS
  Filled 2021-07-23: qty 2

## 2021-07-23 MED ORDER — HYDROMORPHONE HCL 1 MG/ML IJ SOLN
0.5000 mg | Freq: Once | INTRAMUSCULAR | Status: AC
  Administered 2021-07-24: 0.5 mg via INTRAVENOUS
  Filled 2021-07-23: qty 1

## 2021-07-23 MED ORDER — LACTATED RINGERS IV BOLUS
1000.0000 mL | Freq: Once | INTRAVENOUS | Status: AC
  Administered 2021-07-24: 500 mL via INTRAVENOUS

## 2021-07-23 NOTE — Telephone Encounter (Signed)
Opened in error

## 2021-07-23 NOTE — Telephone Encounter (Signed)
Spoke with patient's husband Patrick Jupiter and scheduled a Mychart Palliative Consult for 07/28/21 @ 8:30 AM.  Consent obtained; updated Outlook/Netsmart/Team List and Epic.

## 2021-07-23 NOTE — ED Provider Notes (Addendum)
[facility] EMERGENCY DEPARTMENT Provider Note   CSN: [id number] Arrival date & time: 07/23/21  2108     History  Chief Complaint  Patient presents with   Shortness of Breath    Bea Duren is a 71 y.o. female.  Patient with progressive, metastatic lung cancer, with increased sob in the past few days. Has pleurx cath left chest but states drains minimally, ~ 30 cc every few days. Had admission last month, had pericardiocentesis then. Has been referred to Hospice/palliative care, but no outpatient appt as of yet. States breathing has become uncomfortable. No exertional or episodic chest pain. Bilateral foot/ankle swelling. Occasional non prod cough. No fever or chills.   The history is provided by the patient, a relative and medical records.  Shortness of Breath Associated symptoms: cough   Associated symptoms: no abdominal pain, no fever, no headaches, no neck pain, no rash, no sore throat and no vomiting       Home Medications Prior to Admission medications   Medication Sig Start Date End Date Taking? Authorizing Provider  acetaminophen (TYLENOL) 325 MG tablet Take 2 tablets (650 mg total) by mouth every 4 (four) hours as needed for mild pain, moderate pain, fever or headache. 07/08/21 08/07/21  Little Ishikawa, MD  amiodarone (PACERONE) 200 MG tablet Take 1 tablet (200 mg total) by mouth daily. 07/09/21   Little Ishikawa, MD  Black Pepper-Turmeric (TURMERIC PLUS BLACK PEPPER EXT PO) Take 1 capsule by mouth in the morning, at noon, and at bedtime.    [provider]  Cholecalciferol (VITAMIN D) 125 MCG (5000 UT) CAPS Take 10,000 Units by mouth every other day.    [provider]  Coenzyme Q10 (CO Q 10 PO) Take 300 mg by mouth at bedtime.    [provider]  DANDELION PO Take 1 tablet by mouth daily with supper.    [provider]  dexamethasone (DECADRON) 4 MG tablet Take 1 tablet (4 mg total) by mouth every 12  (twelve) hours. 06/26/21 07/26/21  Donne Hazel, MD  docusate sodium (COLACE) 100 MG capsule Take 1 capsule (100 mg total) by mouth 2 (two) times daily. 06/26/21 07/26/21  Donne Hazel, MD  Flavoring Agent (APRICOT FLAVOR) POWD Take 2 capsules by mouth daily. Apricot Seed Supplement    [provider]  fludrocortisone (FLORINEF) 0.1 MG tablet Take 2 tablets (0.2 mg total) by mouth 2 (two) times daily. 07/14/21   Volanda Napoleon, MD  gabapentin (NEURONTIN) 300 MG capsule Take 1 capsule (300 mg total) by mouth at bedtime. 06/26/21 07/26/21  Donne Hazel, MD  IODINE, KELP, PO Take 1 capsule by mouth in the morning.    [provider]  lacosamide (VIMPAT) 50 MG TABS tablet Take 1 tablet (50 mg total) by mouth 2 (two) times daily. Patient not taking: Reported on 06/30/2021 06/26/21 07/26/21  Donne Hazel, MD  lactulose (CHRONULAC) 10 GM/15ML solution Take 30 mLs (20 g total) by mouth 2 (two) times daily. 07/08/21   Little Ishikawa, MD  letrozole [facility]) 2.5 MG tablet Take 1 tablet (2.5 mg total) by mouth daily. 04/28/21   Volanda Napoleon, MD  levothyroxine (SYNTHROID) 50 MCG tablet Take 1 tablet (50 mcg total) by mouth daily before breakfast. 07/15/21   Ennever, Rudell Cobb, MD  polyethylene glycol (MIRALAX / GLYCOLAX) 17 g packet Take 17 g by mouth daily. 01/23/21   Georgette Shell, MD  Prenatal Vit-Fe Fumarate-FA (MULTIVITAMIN-PRENATAL) 27-0.8 MG  TABS tablet Take 1 tablet by mouth daily at 12 noon.    [provider]  Zinc 100 MG TABS Take 100 mg by mouth daily.    [provider]      Allergies    Patient has no known allergies.    Review of Systems   Review of Systems  Constitutional:  Negative for chills and fever.  HENT:  Negative for sore throat.   Eyes:  Negative for redness.  Respiratory:  Positive for cough and shortness of breath.   Cardiovascular:  Negative for palpitations.  Gastrointestinal:  Negative for abdominal pain and vomiting.   Genitourinary:  Negative for flank pain.  Musculoskeletal:  Negative for back pain and neck pain.  Skin:  Negative for rash.  Neurological:  Negative for headaches.  Hematological:  Does not bruise/bleed easily.  Psychiatric/Behavioral:  Negative for confusion.    Physical Exam Updated Vital Signs BP 97/65 (BP Location: Right Arm)    Pulse 92    Temp 98 F (36.7 C) (Oral)    Resp (!) 27    SpO2 95%  Physical Exam Vitals and nursing note reviewed.  Constitutional:      Appearance: Normal appearance. She is well-developed.  HENT:     Head: Atraumatic.     Nose: Nose normal.     Mouth/Throat:     Mouth: Mucous membranes are moist.  Eyes:     General: No scleral icterus.    Conjunctiva/sclera: Conjunctivae normal.     Pupils: Pupils are equal, round, and reactive to light.  Neck:     Trachea: No tracheal deviation.  Cardiovascular:     Rate and Rhythm: Normal rate and regular rhythm.     Pulses: Normal pulses.     Heart sounds: Normal heart sounds. No murmur heard.   No friction rub. No gallop.  Pulmonary:     Effort: Pulmonary effort is normal. No respiratory distress.     Breath sounds: Normal breath sounds.     Comments: Pleural cath left chest, site without infection.  Abdominal:     General: Bowel sounds are normal. There is no distension.     Palpations: Abdomen is soft.     Tenderness: There is no abdominal tenderness. There is no guarding.  Genitourinary:    Comments: No cva tenderness.  Musculoskeletal:     Cervical back: Normal range of motion and neck supple. No rigidity. No muscular tenderness.     Comments: Mild symmetric bilateral foot/ankle edema.   Skin:    General: Skin is warm and dry.     Findings: No rash.  Neurological:     Mental Status: She is alert.     Comments: Alert, speech normal.   Psychiatric:        Mood and Affect: Mood normal.    ED Results / Procedures / Treatments   Labs (all labs ordered are listed, but only abnormal results  are displayed) Results for orders placed or performed during the hospital encounter of 07/23/21  CBC  Result Value Ref Range   WBC 20.0 (H) 4.0 - 10.5 K/uL   RBC 4.15 3.87 - 5.11 MIL/uL   Hemoglobin 11.3 (L) 12.0 - 15.0 g/dL   HCT 36.2 36.0 - 46.0 %   MCV 87.2 80.0 - 100.0 fL   MCH 27.2 26.0 - 34.0 pg   MCHC 31.2 30.0 - 36.0 g/dL   RDW 19.3 (H) 11.5 - 15.5 %   Platelets 337 150 - 400 K/uL  nRBC 0.0 0.0 - 0.2 %  Comprehensive metabolic panel  Result Value Ref Range   Sodium 128 (L) 135 - 145 mmol/L   Potassium 4.3 3.5 - 5.1 mmol/L   Chloride 96 (L) 98 - 111 mmol/L   CO2 20 (L) 22 - 32 mmol/L   Glucose, Bld 90 70 - 99 mg/dL   BUN 15 8 - 23 mg/dL   Creatinine, Ser 0.43 (L) 0.44 - 1.00 mg/dL   Calcium 8.2 (L) 8.9 - 10.3 mg/dL   Total Protein 5.3 (L) 6.5 - 8.1 g/dL   Albumin 2.4 (L) 3.5 - 5.0 g/dL   AST 10 (L) 15 - 41 U/L   ALT 9 0 - 44 U/L   Alkaline Phosphatase 53 38 - 126 U/L   Total Bilirubin 0.5 0.3 - 1.2 mg/dL   GFR, Estimated >60 >60 mL/min   Anion gap 12 5 - 15  Brain natriuretic peptide  Result Value Ref Range   B Natriuretic Peptide 122.2 (H) 0.0 - 100.0 pg/mL  Troponin I (High Sensitivity)  Result Value Ref Range   Troponin I (High Sensitivity) 6 <18 ng/L   DG Chest 2 View  Result Date: 06/30/2021 CLINICAL DATA:  Short of breath and weakness.  Lung cancer. EXAM: CHEST - 2 VIEW COMPARISON:  06/23/2021 FINDINGS: Left basilar chest tube in place. No pneumothorax. Near complete opacification left chest due to effusion and tumor and collapse left lung. Small amount of aerated lung on the left. Heart is displaced to the right. Possible pericardial effusion. Minimal right pleural effusion.  Right lung otherwise clear. IMPRESSION: Progressive opacification left chest due to fluid, tumor, and collapse. Left chest tube in place. No pneumothorax Heart displaced to the right.  Question pericardial effusion Electronically Signed   By: Franchot Gallo M.D.   On: 06/30/2021 11:22    CT Angio Chest PE W and/or Wo Contrast  Result Date: 06/30/2021 CLINICAL DATA:  Episode of shortness of breath. Known stage IV lung cancer. EXAM: CT ANGIOGRAPHY CHEST WITH CONTRAST TECHNIQUE: Multidetector CT imaging of the chest was performed using the standard protocol during bolus administration of intravenous contrast. Multiplanar CT image reconstructions and MIPs were obtained to evaluate the vascular anatomy. RADIATION DOSE REDUCTION: This exam was performed according to the departmental dose-optimization program which includes automated exposure control, adjustment of the mA and/or kV according to patient size and/or use of iterative reconstruction technique. CONTRAST:  56mL OMNIPAQUE IOHEXOL 350 MG/ML SOLN COMPARISON:  05/05/2021 FINDINGS: Cardiovascular: New very large pericardial effusion. Recommend cardiology consultation. The heart is normal in size. Stable thoracic aorta. No dissection or focal aneurysm. The pulmonary arterial tree is well opacified. No filling defects to suggest pulmonary embolism. The left pulmonary artery branches are severely compressed because of the large left lower lobe lung mass, obstructed left lung and large complex left pleural effusion. Mediastinum/Nodes: Progressive subcarinal adenopathy. Lungs/Pleura: Complete left lower lobe atelectasis with obstruction of the left lower lobe bronchus. There is a PleurX drainage catheter in place but there is a persistent large loculated left pleural fluid collection and significant atelectasis of most of the lung. Small right pleural effusion with overlying atelectasis. No right-sided pulmonary lesions or pulmonary nodules. Upper Abdomen: No significant upper abdominal findings. Musculoskeletal: No breast masses, supraclavicular or axillary adenopathy. The bony thorax is grossly intact. Stable sclerotic lesion involving the L1 vertebral body. Review of the MIP images confirms the above findings. IMPRESSION: 1. No CT findings for  pulmonary embolism. 2. New very large pericardial effusion.  Recommend cardiology consultation. 3. Complete left lower lobe atelectasis with obstruction of the left lower lobe bronchus. 4. PleurX drainage catheter in place but there is a persistent large loculated left pleural fluid collection and significant atelectasis of most of the lung. 5. Progressive subcarinal adenopathy. 6. Small right pleural effusion with overlying atelectasis. 7. Stable sclerotic lesion involving the L1 vertebral body. 8. Aortic atherosclerosis. Aortic Atherosclerosis (ICD10-I70.0). Electronically Signed   By: Marijo Sanes M.D.   On: 06/30/2021 14:18   CARDIAC CATHETERIZATION  Result Date: 06/30/2021 Successful pericardiocentesis via the subxiphoid area with removal of 1065 mL of bloody fluid. 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 Recommendations: The drainage was secured in place.  Monitor output and repeat limited echocardiogram tomorrow.  Suspect that the drainage can likely be removed tomorrow if no significant output. Will use fentanyl for pain control.   DG CHEST PORT 1 VIEW  Result Date: 07/06/2021 CLINICAL DATA:  Follow up pleural effusion following chest tube placement. History of lung cancer. EXAM: PORTABLE CHEST 1 VIEW COMPARISON:  Radiographs 07/02/2021 and 06/30/2021.  CT 06/30/2021. FINDINGS: 0535 hours. Left PleurX catheter remains partially looped inferiorly in the left hemithorax. Previously demonstrated large left pleural effusion has minimally decreased with resulting mild increase in left lung aeration. The visualized heart size and mediastinal contours are stable with a large pericardial effusion identified on recent CT. The right lung is clear. There is no evidence of pneumothorax or significant right-sided pleural effusion. The bones appear unchanged. IMPRESSION: 1. Interval minimal decrease in size of left pleural effusion with mildly increased aeration of the left  lung. This effusion appears loculated on CT. 2. Persistent enlargement of the cardiac silhouette corresponding without large pericardial effusion on recent CT. Electronically Signed   By: Richardean Sale M.D.   On: 07/06/2021 08:38   DG Chest Port 1 View  Result Date: 07/02/2021 CLINICAL DATA:  Recurrent left pleural effusion EXAM: PORTABLE CHEST 1 VIEW COMPARISON:  CT chest and chest radiographs dated June 30, 2021 FINDINGS: Near complete opacification of the left hemithorax due to effusion, tumor and lung atelectasis, unchanged. Small area of aerated lungs is also unchanged. Left-sided chest tube is present. No appreciable pneumothorax. Mediastinal shift to the right. Right lung is clear. No acute osseous abnormality. IMPRESSION: No significant interval change. Electronically Signed   By: Keane Police D.O.   On: 07/02/2021 10:00   ECHOCARDIOGRAM COMPLETE  Result Date: 06/30/2021    ECHOCARDIOGRAM REPORT   Patient Name:   ADDELINE CALARCO Date of Exam: 06/30/2021 Medical Rec #:  [id number]          Height:       62.0 in Accession #:    [id number]         Weight:       110.0 lb Date of Birth:  Nov 06, 1950          BSA:          1.483 m Patient Age:    22 years           BP:           114/82 mmHg Patient Gender: F                  HR:           109 bpm. Exam Location:  Inpatient Procedure: 2D Echo, Cardiac Doppler and Color Doppler STAT ECHO Indications:    Pericardial effusion I31.3  History:  Patient has prior history of Echocardiogram examinations, most                 recent 01/18/2021. Risk Factors:Diabetes and Dyslipidemia. Lung                 Cancer.  Sonographer:    Darlina Sicilian RDCS Referring Phys: [id number] [facility]  Sonographer Comments: Recent Thoracentesis in Apical position. Imaging best from subcostal. IMPRESSIONS  1. Left ventricular ejection fraction, by estimation, is 60 to 65%. The left ventricle has normal function. The left ventricle has no regional wall motion abnormalities.  Left ventricular diastolic parameters are consistent with Grade I diastolic dysfunction (impaired relaxation).  2. Right ventricular systolic function is normal. The right ventricular size is normal. There is normal pulmonary artery systolic pressure.  3. Compared with the echo 01/2021, pericardial effusion is larger. Large pericardial effusion measuring up to 4.2 cm. Large pericardial effusion. The pericardial effusion is circumferential. Findings are consistent with cardiac tamponade.  4. The mitral valve is normal in structure. No evidence of mitral valve regurgitation. No evidence of mitral stenosis.  5. The aortic valve is tricuspid. Aortic valve regurgitation is not visualized. No aortic stenosis is present.  6. The inferior vena cava is normal in size with <50% respiratory variability, suggesting right atrial pressure of 8 mmHg. FINDINGS  Left Ventricle: Left ventricular ejection fraction, by estimation, is 60 to 65%. The left ventricle has normal function. The left ventricle has no regional wall motion abnormalities. The left ventricular internal cavity size was normal in size. There is  no left ventricular hypertrophy. Left ventricular diastolic parameters are consistent with Grade I diastolic dysfunction (impaired relaxation). Right Ventricle: The right ventricular size is normal. No increase in right ventricular wall thickness. Right ventricular systolic function is normal. There is normal pulmonary artery systolic pressure. The tricuspid regurgitant velocity is 1.94 m/s, and  with an assumed right atrial pressure of 8 mmHg, the estimated right ventricular systolic pressure is 44.3 mmHg. Left Atrium: Left atrial size was normal in size. Right Atrium: Right atrial size was normal in size. Pericardium: Compared with the echo 01/2021, pericardial effusion is larger. Large pericardial effusion measuring up to 4.2 cm. A large pericardial effusion is present. The pericardial effusion is circumferential. There is  diastolic collapse of the right ventricular free wall, diastolic collapse of the right atrial wall and excessive respiratory variation in the mitral valve spectral Doppler velocities. There is evidence of cardiac tamponade. Mitral Valve: The mitral valve is normal in structure. No evidence of mitral valve regurgitation. No evidence of mitral valve stenosis. Tricuspid Valve: The tricuspid valve is normal in structure. Tricuspid valve regurgitation is mild . No evidence of tricuspid stenosis. Aortic Valve: The aortic valve is tricuspid. Aortic valve regurgitation is not visualized. No aortic stenosis is present. Pulmonic Valve: The pulmonic valve was normal in structure. Pulmonic valve regurgitation is not visualized. No evidence of pulmonic stenosis. Aorta: The aortic root is normal in size and structure. Venous: The inferior vena cava is normal in size with less than 50% respiratory variability, suggesting right atrial pressure of 8 mmHg. IAS/Shunts: No atrial level shunt detected by color flow Doppler.  LEFT VENTRICLE PLAX 2D LVOT diam:     1.90 cm LV SV:         33 LV SV Index:   23 LVOT Area:     2.84 cm  AORTIC VALVE LVOT Vmax:   93.20 cm/s LVOT Vmean:  62.300 cm/s LVOT VTI:  0.118 m MITRAL VALVE               TRICUSPID VALVE MV Area (PHT): 4.86 cm    TR Peak grad:   15.1 mmHg MV Decel Time: 156 msec    TR Vmax:        194.00 cm/s MV E velocity: 43.30 cm/s MV A velocity: 58.30 cm/s  SHUNTS MV E/A ratio:  0.74        Systemic VTI:  0.12 m                            Systemic Diam: 1.90 cm Skeet Latch MD Electronically signed by Skeet Latch MD Signature Date/Time: 06/30/2021/3:43:19 PM    Final    ECHOCARDIOGRAM LIMITED  Result Date: 07/05/2021    ECHOCARDIOGRAM LIMITED REPORT   Patient Name:   NKENGE SONNTAG Date of Exam: 07/05/2021 Medical Rec #:  [id number]          Height:       62.0 in Accession #:    [id number]         Weight:       104.1 lb Date of Birth:  02-03-1951          BSA:           1.448 m Patient Age:    73 years           BP:           110/72 mmHg Patient Gender: F                  HR:           97 bpm. Exam Location:  Inpatient Procedure: Limited Echo, Color Doppler and Cardiac Doppler Indications:    I31.3 Pericardial effusion (noninflammatory)  History:        Patient has prior history of Echocardiogram examinations, most                 recent 07/02/2021. Pericardial Disease, Abnormal ECG,                 Arrythmias:Tachycardia; Signs/Symptoms:Syncope. Cardiac                 tamponade. Pleural effusion. Lung and breast cancer.  Sonographer:    Roseanna Rainbow RDCS Referring Phys: [id number] [facility]  Sonographer Comments: Technically difficult study due to poor echo windows. Patient supine with left pleural drain. IMPRESSIONS  1. Abnormal septal motion . Left ventricular ejection fraction, by estimation, is 60 to 65%. The left ventricle has normal function. The left ventricle has no regional wall motion abnormalities.  2. Right ventricular systolic function is normal. The right ventricular size is normal.  3. Right atrial size was mildly dilated.  4. The pericardial effusion is RA posterior.  5. The mitral valve is normal in structure. No evidence of mitral valve regurgitation. No evidence of mitral stenosis.  6. The aortic valve is normal in structure. There is mild calcification of the aortic valve. Aortic valve regurgitation is not visualized. Aortic valve sclerosis is present, with no evidence of aortic valve stenosis.  7. The inferior vena cava is normal in size with greater than 50% respiratory variability, suggesting right atrial pressure of 3 mmHg. FINDINGS  Left Ventricle: Abnormal septal motion. Left ventricular ejection fraction, by estimation, is 60 to 65%. The left ventricle has normal function. The left ventricle has no regional wall motion abnormalities. The left  ventricular internal cavity size was normal in size. There is no left ventricular hypertrophy. Right  Ventricle: The right ventricular size is normal. No increase in right ventricular wall thickness. Right ventricular systolic function is normal. Left Atrium: Left atrial size was normal in size. Right Atrium: Right atrial size was mildly dilated. Pericardium: Trivial pericardial effusion is present. The pericardial effusion is RA posterior. Mitral Valve: The mitral valve is normal in structure. No evidence of mitral valve stenosis. Tricuspid Valve: The tricuspid valve is normal in structure. Tricuspid valve regurgitation is mild . No evidence of tricuspid stenosis. Aortic Valve: The aortic valve is normal in structure. There is mild calcification of the aortic valve. Aortic valve regurgitation is not visualized. Aortic valve sclerosis is present, with no evidence of aortic valve stenosis. Pulmonic Valve: The pulmonic valve was normal in structure. Pulmonic valve regurgitation is not visualized. No evidence of pulmonic stenosis. Aorta: The aortic root is normal in size and structure. Venous: The inferior vena cava is normal in size with greater than 50% respiratory variability, suggesting right atrial pressure of 3 mmHg. IAS/Shunts: No atrial level shunt detected by color flow Doppler. LEFT VENTRICLE PLAX 2D LVIDd:         3.70 cm LVIDs:         2.60 cm LV PW:         0.90 cm LV IVS:        1.00 cm  LV Volumes (MOD) LV vol d, MOD A2C: 53.8 ml LV vol d, MOD A4C: 58.5 ml LV vol s, MOD A2C: 21.7 ml LV vol s, MOD A4C: 22.2 ml LV SV MOD A2C:     32.1 ml LV SV MOD A4C:     58.5 ml LV SV MOD BP:      36.5 ml IVC IVC diam: 1.70 cm LEFT ATRIUM         Index LA diam:    2.40 cm 1.66 cm/m   AORTA Ao Root diam: 2.90 cm Jenkins Rouge MD Electronically signed by Jenkins Rouge MD Signature Date/Time: 07/05/2021/10:19:36 AM    Final    ECHOCARDIOGRAM LIMITED  Result Date: 07/02/2021    ECHOCARDIOGRAM LIMITED REPORT   Patient Name:   LAKEYSHA SLUTSKY Date of Exam: 07/02/2021 Medical Rec #:  [id number]          Height:       62.0 in  Accession #:    [id number]         Weight:       110.0 lb Date of Birth:  [birth year]          BSA:          1.483 m Patient Age:    56 years           BP:           101/73 mmHg Patient Gender: F                  HR:           102 bpm. Exam Location:  Inpatient Procedure: Limited Echo Indications:    Pericardial effusion I31.3  History:        Patient has prior history of Echocardiogram examinations, most                 recent 07/01/2021. Risk Factors:Diabetes and Dyslipidemia. Lung                 Cancer.  Sonographer:    Darlina Sicilian  RDCS Referring Phys: [id number] [facility] A CHANDRASEKHAR  Conclusion(s)/Recommendation(s): Normal LV function, EF 60-65%. RV normal size and function. Small pericardial effusion along the RV free wall. No cardiac tamponade. Phineas Inches Electronically signed by Phineas Inches Signature Date/Time: 07/02/2021/9:05:55 AM    Final    ECHOCARDIOGRAM LIMITED  Result Date: 07/01/2021    ECHOCARDIOGRAM LIMITED REPORT   Patient Name:   EISA CONAWAY Date of Exam: 07/01/2021 Medical Rec #:  [id number]          Height:       62.0 in Accession #:    [id number]         Weight:       110.0 lb Date of Birth:  [birth year]          BSA:          1.483 m Patient Age:    5 years           BP:           94/72 mmHg Patient Gender: F                  HR:           126 bpm. Exam Location:  Inpatient Procedure: Limited Echo Indications:    Pericardial Effusion  History:        Patient has prior history of Echocardiogram examinations, most                 recent 06/30/2021.  Sonographer:    Jefferey Pica Referring Phys: [id number] A ARIDA  Sonographer Comments: Unable to obtain images from all windows due to dressings. IMPRESSIONS  1. Limited study to assess pericardial effusion; small pericardial effusion predominantly surrounding right atrium; effusion much smaller compared to 06/30/20.  2. Left ventricular ejection fraction, by estimation, is 50 to 55%. The left ventricle has low normal function. The  left ventricle has no regional wall motion abnormalities.  3. Right ventricular systolic function is normal. The right ventricular size is normal.  4. A small pericardial effusion is present.  5. The mitral valve is normal in structure.  6. The aortic valve has an indeterminant number of cusps.  7. The inferior vena cava is normal in size with greater than 50% respiratory variability, suggesting right atrial pressure of 3 mmHg. FINDINGS  Left Ventricle: Left ventricular ejection fraction, by estimation, is 50 to 55%. The left ventricle has low normal function. The left ventricle has no regional wall motion abnormalities. The left ventricular internal cavity size was normal in size. Right Ventricle: The right ventricular size is normal. Right ventricular systolic function is normal. Left Atrium: Left atrial size was normal in size. Right Atrium: Right atrial size was normal in size. Pericardium: A small pericardial effusion is present. Mitral Valve: The mitral valve is normal in structure. Tricuspid Valve: The tricuspid valve is normal in structure. Aortic Valve: The aortic valve has an indeterminant number of cusps. Venous: The inferior vena cava is normal in size with greater than 50% respiratory variability, suggesting right atrial pressure of 3 mmHg. Additional Comments: Limited study to assess pericardial effusion; small pericardial effusion predominantly surrounding right atrium; effusion much smaller compared to 06/30/20. LEFT VENTRICLE PLAX 2D LVIDd:         4.20 cm LVIDs:         3.00 cm LV PW:         1.10 cm LV IVS:        1.00 cm LVOT diam:  2.00 cm LVOT Area:     3.14 cm  IVC IVC diam: 2.00 cm LEFT ATRIUM         Index LA diam:    3.20 cm 2.16 cm/m   AORTA Ao Root diam: 3.50 cm Ao Asc diam:  3.50 cm  SHUNTS Systemic Diam: 2.00 cm Kirk Ruths MD Electronically signed by Kirk Ruths MD Signature Date/Time: 07/01/2021/11:59:27 AM    Final    ECHOCARDIOGRAM LIMITED  Result Date: 06/30/2021     ECHOCARDIOGRAM LIMITED REPORT   Patient Name:   RAEDYN KLINCK Date of Exam: 06/30/2021 Medical Rec #:  [id number]          Height:       62.0 in Accession #:    [id number]         Weight:       110.0 lb Date of Birth:  01/15/1951          BSA:          1.483 m Patient Age:    41 years           BP:           116/68 mmHg Patient Gender: F                  HR:           104 bpm. Exam Location:  Inpatient Procedure: Limited Echo       STAT ECHO Reported to: DR Fletcher Anon. Indications:    pericardiocentesis  History:        Patient has prior history of Echocardiogram examinations, most                 recent 06/30/2020. Cancer.  Sonographer:    Johny Chess RDCS Referring Phys: [place]  1. Pericardiocenteisis performed. Prior to the procedure there was a large, circumferential pericardial effusion. After pericardiocentesis there was no effusion present. Resolution of right ventricular diastolic collapse.  2. Left ventricular ejection fraction, by estimation, is 60 to 65%. The left ventricle has normal function.  3. Large pericardial effusion. The pericardial effusion is circumferential. FINDINGS  Left Ventricle: Left ventricular ejection fraction, by estimation, is 60 to 65%. The left ventricle has normal function. Pericardium: A large pericardial effusion is present. The pericardial effusion is circumferential. Skeet Latch MD Electronically signed by Skeet Latch MD Signature Date/Time: 06/30/2021/7:31:10 PM    Final      EKG None  Radiology DG Chest [facility] 1 View  Result Date: 07/23/2021 CLINICAL DATA:  Stage IV lung cancer patient with large encompassing left pleural effusion with PleurX drainage catheter in place, returns with shortness of breath and chest pain. EXAM: PORTABLE CHEST 1 VIEW COMPARISON:  Portable chest 07/06/2021, chest CT 06/30/2021. FINDINGS: Left basilar PleurX drainage catheter remains coiled in the lower lateral left chest. Large circumferential left  pleural effusion has probably at least mildly enlarged compared to the prior study. There is only a small area of preserved aeration in the lateral left mid lung, smaller than previously. The heart silhouette is obscured on the left. Central vessels visible on the right are normal caliber and a small right pleural effusion appears increased with increased hazy consolidation or atelectasis in the right base. Right lung fields are otherwise clear. There are numerous overlying monitor wires on the right. Thoracic cage grossly intact. IMPRESSION: 1. PleurX drainage catheter lower lateral left chest unchanged in position with large left pleural effusion likely at least mildly increased  today. 2. Increasing small right pleural effusion and hazy consolidation or atelectasis in the right base. 3. The cardiac silhouette obscured on the left. Central vessels are normal caliber. Electronically Signed   By: Telford Nab M.D.   On: 07/23/2021 22:29    Procedures Procedures    Medications Ordered in ED Medications - No data to display  ED Course/ Medical Decision Making/ A&P                           Medical Decision Making Problems Addressed: Adenocarcinoma of lung, stage 4, unspecified laterality ([place]): chronic illness or injury with exacerbation, progression, or side effects of treatment that poses a threat to life or bodily functions Dyspnea, unspecified type: acute illness or injury Hypoalbuminemia: chronic illness or injury with exacerbation, progression, or side effects of treatment Hyponatremia: chronic illness or injury Pleural effusion: acute illness or injury with systemic symptoms that poses a threat to life or bodily functions Primary malignant neoplasm of lung metastatic to other site, unspecified laterality [facility]): chronic illness or injury with exacerbation, progression, or side effects of treatment Protein-calorie malnutrition, severe ([place]): chronic illness or injury with exacerbation,  progression, or side effects of treatment  Amount and/or Complexity of Data Reviewed Independent Historian:     Details: family, additional hx External Data Reviewed: labs, radiology and notes. Labs: ordered. Decision-making details documented in ED Course. Radiology: ordered and independent interpretation performed. Decision-making details documented in ED Course. ECG/medicine tests: ordered and independent interpretation performed. Decision-making details documented in ED Course. Discussion of management or test interpretation with external provider(s): Hospitalists consulted for admission, pt/labs discussed.   Risk Prescription drug management. Parenteral controlled substances. Decision regarding hospitalization. Decision not to resuscitate or to de-escalate care because of poor prognosis.  Iv ns. Continuous pulse ox and cardiac monitoring. Stat labs. Pcxr. Ecg.   Cardiac monitor: sinus rhythm, rate 90.  02 [place] for comfort. Dilaudid iv.   Reviewed nursing notes and prior charts for additional history. External reports reviewed - recent d/c summary reviewed.   Additional hx from family member.   Labs reviewed/interpreted by me - wbc high c/w prior results, bnp not significantly elevated.  Trop normal. Na mildly low.   CXR reviewed/interpreted by me - bilateral pleural effusions, L > R.   Palliative care consulted - discussed pt - they indicate they will have someone see pt in AM if admitted to medicine.   Medicine consulted for admission.           Final Clinical Impression(s) / ED Diagnoses Final diagnoses:  None    Rx / DC Orders ED Discharge Orders     None           Lajean Saver, MD 07/23/21 2313

## 2021-07-23 NOTE — ED Triage Notes (Addendum)
Pt BIB [facility] for shortness of breath/intermittent chest pain w/ inspiration, VSS, 94% RA. Pt reports having fluid removed from her heart at a recent admission. Pt has stage 4 lung cancer.

## 2021-07-23 NOTE — H&P (Signed)
History and Physical    Patient: Tami Stone YTK:[id number] DOB: Aug 01, 1950 DOA: 07/23/2021 DOS: the patient was seen and examined on 07/23/2021 PCP: Gaynelle Cage, MD  Patient coming from: Home  Chief Complaint:  Chief Complaint  Patient presents with   Shortness of Breath    HPI: Tami Stone is a 71 y.o. 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  Recently admitted from 1/18-1/26 with cardiac tamponade from malignant pericardial effusion s/p pericardiocentesis with over 1L removed. Report echo on 1/23 shows trivial pericardial effusion.  She also had recurrent malignant and loculated pleural effusion and malfunctioning Pleurx catheter  She followed up with oncology Dr. Marin Olp on 2/1 and was deemed not a candidate for systemic treatment.  The subject of hospice was discussed but patient and husband was not yet ready for that. However due to worsening symptoms of shortness of breath in the last few days they appointment now set up with palliative outpatient.  She has also been having hypotension and was recently started on fludrocortisone but has been noting increasing lower extremity edema.  She now takes it as needed when systolic is noted to be below 100.  In the ED, she was afebrile and hypotensive with systolic in the 55D but stable on room air.  Chest x-ray showing unchanged position of Pleurx catheter but large left-sided pleural effusion appears slightly increased.  There is also a small right pleural effusion.  Other lab work remained stable with chronic leukocytosis at 20K.  Sodium remains low at 128 but has been  stable from recent labs.  Review of Systems: As mentioned in the history of present illness. All other systems reviewed and are negative. Past Medical History:  Diagnosis Date   Diabetes ([place])    Does not check bs at home   Dyspnea    High cholesterol    Lung cancer ([place])    04/2020   Past Surgical History:  Procedure Laterality Date   ABDOMINAL HYSTERECTOMY     BREAST BIOPSY Right    BRONCHIAL BIOPSY  04/30/2020   Procedure: BRONCHIAL BIOPSIES;  Surgeon: Collene Gobble, MD;  Location: [place];  Service: Cardiopulmonary;;   BRONCHIAL BRUSHINGS  04/30/2020   Procedure: BRONCHIAL BRUSHINGS;  Surgeon: Collene Gobble, MD;  Location: [place];  Service: Cardiopulmonary;;   BRONCHIAL BRUSHINGS  01/20/2021   Procedure: BRONCHIAL BRUSHINGS;  Surgeon: Laurin Coder, MD;  Location: [facility];  Service: Endoscopy;;   BRONCHIAL WASHINGS  01/20/2021   Procedure: BRONCHIAL WASHINGS;  Surgeon: Laurin Coder, MD;  Location: [facility];  Service: Endoscopy;;   CHEST TUBE INSERTION Left 05/28/2021   Procedure: INSERTION PLEURAL DRAINAGE CATHETER;  Surgeon: [name], DO;  Location: [place];  Service: Pulmonary;  Laterality: Left;  Prior to bronchoscopy   HEMOSTASIS CONTROL  04/30/2020   Procedure: HEMOSTASIS CONTROL;  Surgeon: Collene Gobble, MD;  Location: [place];  Service: Cardiopulmonary;;   IR THORACENTESIS ASP PLEURAL SPACE W/IMG GUIDE  04/15/2021   IR THORACENTESIS ASP PLEURAL SPACE W/IMG GUIDE  05/03/2021   IR THORACENTESIS ASP PLEURAL SPACE W/IMG GUIDE  05/11/2021   PERICARDIOCENTESIS N/A 06/30/2021   Procedure: PERICARDIOCENTESIS;  Surgeon: Wellington Hampshire, MD;  Location: [facility];  Service: Cardiovascular;  Laterality: N/A;   TUBAL LIGATION     VIDEO BRONCHOSCOPY N/A 04/30/2020   Procedure: VIDEO BRONCHOSCOPY WITH FLUORO;  Surgeon: Collene Gobble, MD;  Location: [facility];  Service: Cardiopulmonary;  Laterality: N/A;   VIDEO  BRONCHOSCOPY N/A 01/20/2021   Procedure: VIDEO BRONCHOSCOPY WITHOUT FLUORO;  Surgeon: Laurin Coder, MD;  Location: [facility];  Service: Endoscopy;  Laterality: N/A;   VIDEO BRONCHOSCOPY Left 05/28/2021   Procedure: VIDEO BRONCHOSCOPY WITHOUT FLUORO;  Surgeon: [name], DO;  Location: [place];  Service: Pulmonary;  Laterality: Left;   Social History:  reports that she has never smoked. She has never used smokeless tobacco. She reports that she does not currently use alcohol. She reports that she does not use drugs.  No Known Allergies  Family History  Problem Relation Age of Onset   Breast cancer Sister        half sister on maternal side   Colon cancer Neg Hx    Esophageal cancer Neg Hx     Prior to Admission medications   Medication Sig Start Date End Date Taking? Authorizing Provider  amiodarone (PACERONE) 200 MG tablet Take 1 tablet (200 mg total) by mouth daily. 07/09/21  Yes Little Ishikawa, MD  Black Pepper-Turmeric (TURMERIC PLUS BLACK PEPPER EXT PO) Take 1 capsule by mouth in the morning, at noon, and at bedtime.   Yes [provider]  Cholecalciferol (VITAMIN D) 125 MCG (5000 UT) CAPS Take 10,000 Units by mouth every other day.   Yes [provider]  Coenzyme Q10 (CO Q 10 PO) Take 300 mg by mouth at bedtime.   Yes [provider]  DANDELION PO Take 1 tablet by mouth daily with supper.   Yes [provider]  dexamethasone (DECADRON) 4 MG tablet Take 1 tablet (4 mg total) by mouth every 12 (twelve) hours. 06/26/21 07/26/21 Yes Donne Hazel, MD  docusate sodium (COLACE) 100 MG capsule Take 1 capsule (100 mg total) by mouth 2 (two) times daily. 06/26/21 07/26/21 Yes Donne Hazel, MD  Flavoring Agent (APRICOT FLAVOR) POWD Take 2 capsules by mouth daily. Apricot Seed Supplement   Yes [provider]  fludrocortisone (FLORINEF) 0.1 MG tablet Take 2 tablets (0.2 mg total) by mouth 2 (two) times daily. 07/14/21  Yes  Volanda Napoleon, MD  gabapentin (NEURONTIN) 300 MG capsule Take 1 capsule (300 mg total) by mouth at bedtime. 06/26/21 07/26/21 Yes Donne Hazel, MD  IODINE, KELP, PO Take 1 capsule by mouth in the morning.   Yes [provider]  letrozole (FEMARA) 2.5 MG tablet Take 1 tablet (2.5 mg total) by mouth daily. 04/28/21  Yes Volanda Napoleon, MD  levothyroxine (SYNTHROID) 50 MCG tablet Take 1 tablet (50 mcg total) by mouth daily before breakfast. 07/15/21  Yes Ennever, Rudell Cobb, MD  OVER THE COUNTER MEDICATION Take 1 tablet by mouth daily as needed (constipation). "Veggie lax"   Yes [provider]  Prenatal Vit-Fe Fumarate-FA (MULTIVITAMIN-PRENATAL) 27-0.8 MG TABS tablet Take 1 tablet by mouth daily at 12 noon.   Yes [provider]  Zinc 100 MG TABS Take 100 mg by mouth daily.   Yes [provider]  acetaminophen (TYLENOL) 325 MG tablet Take 2 tablets (650 mg total) by mouth every 4 (four) hours as needed for mild pain, moderate pain, fever or headache. Patient not taking: Reported on 07/23/2021 07/08/21 08/07/21  Little Ishikawa, MD  lacosamide (VIMPAT) 50  MG TABS tablet Take 1 tablet (50 mg total) by mouth 2 (two) times daily. Patient not taking: Reported on 06/30/2021 06/26/21 07/26/21  Donne Hazel, MD  lactulose (CHRONULAC) 10 GM/15ML solution Take 30 mLs (20 g total) by mouth 2 (two) times daily. Patient not taking: Reported on 07/23/2021 07/08/21   Little Ishikawa, MD  polyethylene glycol (MIRALAX / GLYCOLAX) 17 g packet Take 17 g by mouth daily. Patient not taking: Reported on 07/23/2021 01/23/21   Georgette Shell, MD    Physical Exam: Vitals:   07/23/21 2128 07/23/21 2230  BP: 97/65 103/70  Pulse: 92 90  Resp: (!) 27 (!) 29  Temp: 98 F (36.7 C)   TempSrc: Oral   SpO2: 95% 96%   Constitutional: thin, cachectic chronically ill elderly female laying at approximately 20 degree incline in bed Eyes: PERRL, lids and conjunctivae normal ENMT:  Mucous membranes are moist.  Neck: normal, supple Respiratory: Diminished breath sounds to right lung but, no wheezing, no crackles. Normal respiratory effort on room air. No accessory muscle use.  Cardiovascular: Regular rate and rhythm, no murmurs / rubs / gallops. No extremity edema.  Pleurx catheter in place to left anterior chest. Abdomen: no tenderness,  Bowel sounds positive.  Musculoskeletal: no clubbing / cyanosis. No joint deformity upper and lower extremities.  Muscle wasting on her extremity.   Skin: no rashes, lesions, ulcers.  Neurologic: CN 2-12 grossly intact.  Psychiatric: Normal judgment and insight. Alert and oriented x 3. Normal mood.  Data Reviewed:   CXR on my review with complete opacity of the left lung and small pleural effusion on the right EKG pending  Assessment and Plan:  Persistent malignant and loculated pleural effusion Malfunctioning Pleurx catheter -CXR shows slightly increase to left sided pleural effusion - Need pulmonary consult in the morning -had needed TPA flush in previous admission   Hypotension -recently been getting IV fluid outpatient and was started on fludrocortisone by oncology on 2/1 but has only been taking it as needed due to increasing lower extremity edema -will resume here -check EKG -check echocardiogram due to hx of pericardial effusion  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 No longer candidate for systemic therapy per oncology. Palliative has been consulted and will see tomorrow.  -Follows with Dr. Marin Olp with oncology  Hyponatremia Na of 128. Remains stable. Likely due to hypovolemic. Has received 500cc LR in ED. Will follow repeat tomorrow  Atrial tachycardia Continue amiodarone  Hypothyroidism Continue levothyroxine  Chronic Dysphagia Advance diet as  tolerated.    Severe protein calorie malnutrition - Due to decreased p.o. intake, dysphagia and cancer       Advance Care Planning:   Code Status: Prior DNR  Consults: PALLIATIVE  Family Communication: Discussed with the patient and husband at bedside  Severity of Illness: The appropriate patient status for this patient is INPATIENT. Inpatient status is judged to be reasonable and necessary in order to provide the required intensity of service to ensure the patient's safety. The patient's presenting symptoms, physical exam findings, and initial radiographic and laboratory data in the context of their chronic comorbidities is felt to place them at high risk for further clinical deterioration. Furthermore, it is not anticipated that the patient will be medically stable for discharge from the hospital within 2 midnights of admission.   * I certify  that at the point of admission it is my clinical judgment that the patient will require inpatient hospital care spanning beyond 2 midnights from the point of admission due to high intensity of service, high risk for further deterioration and high frequency of surveillance required.*  Author: Orene Desanctis, DO 07/23/2021 11:59 PM  For on call review [email or web address].

## 2021-07-24 ENCOUNTER — Other Ambulatory Visit: Payer: Self-pay

## 2021-07-24 ENCOUNTER — Inpatient Hospital Stay (HOSPITAL_COMMUNITY): Payer: Medicare Other

## 2021-07-24 DIAGNOSIS — I3139 Other pericardial effusion (noninflammatory): Secondary | ICD-10-CM | POA: Diagnosis not present

## 2021-07-24 DIAGNOSIS — C3490 Malignant neoplasm of unspecified part of unspecified bronchus or lung: Secondary | ICD-10-CM | POA: Diagnosis not present

## 2021-07-24 DIAGNOSIS — I959 Hypotension, unspecified: Secondary | ICD-10-CM | POA: Diagnosis present

## 2021-07-24 DIAGNOSIS — E43 Unspecified severe protein-calorie malnutrition: Secondary | ICD-10-CM | POA: Diagnosis not present

## 2021-07-24 DIAGNOSIS — R0600 Dyspnea, unspecified: Secondary | ICD-10-CM

## 2021-07-24 DIAGNOSIS — J90 Pleural effusion, not elsewhere classified: Secondary | ICD-10-CM

## 2021-07-24 DIAGNOSIS — Z515 Encounter for palliative care: Secondary | ICD-10-CM

## 2021-07-24 DIAGNOSIS — Z66 Do not resuscitate: Secondary | ICD-10-CM

## 2021-07-24 DIAGNOSIS — Z7189 Other specified counseling: Secondary | ICD-10-CM

## 2021-07-24 LAB — RESP PANEL BY RT-PCR (FLU A&B, COVID) ARPGX2
Influenza A by PCR: NEGATIVE
Influenza B by PCR: NEGATIVE
SARS Coronavirus 2 by RT PCR: NEGATIVE

## 2021-07-24 LAB — BASIC METABOLIC PANEL
Anion gap: 10 (ref 5–15)
BUN: 14 mg/dL (ref 8–23)
CO2: 23 mmol/L (ref 22–32)
Calcium: 8 mg/dL — ABNORMAL LOW (ref 8.9–10.3)
Chloride: 97 mmol/L — ABNORMAL LOW (ref 98–111)
Creatinine, Ser: 0.37 mg/dL — ABNORMAL LOW (ref 0.44–1.00)
GFR, Estimated: >60 mL/min (ref >60)
Glucose, Bld: 91 mg/dL (ref 70–99)
Potassium: 4.5 mmol/L (ref 3.5–5.1)
Sodium: 130 mmol/L — ABNORMAL LOW (ref 135–145)

## 2021-07-24 LAB — CBC
HCT: 32.1 % — ABNORMAL LOW (ref 36.0–46.0)
Hemoglobin: 10.2 g/dL — ABNORMAL LOW (ref 12.0–15.0)
MCH: 27.9 pg (ref 26.0–34.0)
MCHC: 31.8 g/dL (ref 30.0–36.0)
MCV: 87.7 fL (ref 80.0–100.0)
Platelets: 318 10*3/uL (ref 150–400)
RBC: 3.66 MIL/uL — ABNORMAL LOW (ref 3.87–5.11)
RDW: 19.4 % — ABNORMAL HIGH (ref 11.5–15.5)
WBC: 18.4 10*3/uL — ABNORMAL HIGH (ref 4.0–10.5)
nRBC: 0 % (ref 0.0–0.2)

## 2021-07-24 LAB — TROPONIN I (HIGH SENSITIVITY): Troponin I (High Sensitivity): 6 ng/L (ref <18)

## 2021-07-24 LAB — ECHOCARDIOGRAM LIMITED: S' Lateral: 2.6 cm

## 2021-07-24 MED ORDER — HYDROMORPHONE HCL 1 MG/ML IJ SOLN
0.5000 mg | INTRAMUSCULAR | Status: DC | PRN
  Administered 2021-07-25: 0.5 mg via INTRAVENOUS
  Filled 2021-07-24: qty 0.5

## 2021-07-24 MED ORDER — FLUDROCORTISONE ACETATE 0.1 MG PO TABS
0.2000 mg | ORAL_TABLET | Freq: Two times a day (BID) | ORAL | Status: DC
  Administered 2021-07-24 (×2): 0.2 mg via ORAL
  Filled 2021-07-24 (×4): qty 2

## 2021-07-24 MED ORDER — ENSURE ENLIVE PO LIQD
237.0000 mL | Freq: Two times a day (BID) | ORAL | Status: DC
  Administered 2021-07-24 – 2021-07-26 (×6): 237 mL via ORAL
  Filled 2021-07-24 (×3): qty 237

## 2021-07-24 MED ORDER — AMIODARONE HCL 200 MG PO TABS
200.0000 mg | ORAL_TABLET | Freq: Every day | ORAL | Status: DC
  Administered 2021-07-24: 200 mg via ORAL
  Filled 2021-07-24: qty 1

## 2021-07-24 MED ORDER — LEVOTHYROXINE SODIUM 25 MCG PO TABS
50.0000 ug | ORAL_TABLET | Freq: Every day | ORAL | Status: DC
  Administered 2021-07-24: 50 ug via ORAL
  Filled 2021-07-24: qty 2

## 2021-07-24 MED ORDER — MORPHINE SULFATE (PF) 2 MG/ML IV SOLN
2.0000 mg | INTRAVENOUS | Status: DC | PRN
  Administered 2021-07-24: 2 mg via INTRAVENOUS
  Filled 2021-07-24: qty 1

## 2021-07-24 MED ORDER — LETROZOLE 2.5 MG PO TABS
2.5000 mg | ORAL_TABLET | Freq: Every day | ORAL | Status: DC
  Administered 2021-07-24: 2.5 mg via ORAL
  Filled 2021-07-24 (×2): qty 1

## 2021-07-24 MED ORDER — DOCUSATE SODIUM 100 MG PO CAPS
100.0000 mg | ORAL_CAPSULE | Freq: Two times a day (BID) | ORAL | Status: DC
  Administered 2021-07-24 – 2021-07-26 (×5): 100 mg via ORAL
  Filled 2021-07-24 (×5): qty 1

## 2021-07-24 MED ORDER — DEXAMETHASONE 4 MG PO TABS
4.0000 mg | ORAL_TABLET | Freq: Two times a day (BID) | ORAL | Status: DC
  Administered 2021-07-24 – 2021-07-26 (×5): 4 mg via ORAL
  Filled 2021-07-24 (×5): qty 1

## 2021-07-24 MED ORDER — LACOSAMIDE 50 MG PO TABS
50.0000 mg | ORAL_TABLET | Freq: Two times a day (BID) | ORAL | Status: DC
  Administered 2021-07-24 – 2021-07-26 (×6): 50 mg via ORAL
  Filled 2021-07-24 (×6): qty 1

## 2021-07-24 MED ORDER — GABAPENTIN 300 MG PO CAPS
300.0000 mg | ORAL_CAPSULE | Freq: Every day | ORAL | Status: DC
  Administered 2021-07-24 – 2021-07-25 (×3): 300 mg via ORAL
  Filled 2021-07-24 (×3): qty 1

## 2021-07-24 NOTE — Progress Notes (Addendum)
PROGRESS NOTE  Tami Stone OVF:[id number] DOB: Aug 30, 1950 DOA: 07/23/2021 PCP: Gaynelle Cage, MD  Brief History    Tami Stone is a 71 y.o. 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   Recently admitted from 1/18-1/26 with cardiac tamponade from malignant pericardial effusion s/p pericardiocentesis with over 1L removed. Report echo on 1/23 shows trivial pericardial effusion.  She also had recurrent malignant and loculated pleural effusion and malfunctioning Pleurx catheter  She followed up with oncology Dr. Marin Olp on 2/1 and was deemed not a candidate for systemic treatment.  The subject of hospice was discussed but patient and husband was not yet ready for that. However due to worsening symptoms of shortness of breath in the last few days they appointment now set up with palliative outpatient.   She has also been having hypotension and was recently started on fludrocortisone but has been noting increasing lower extremity edema.  She now takes it as needed when systolic is noted to be below 100.   In the ED, she was afebrile and hypotensive with systolic in the 84Z but stable on room air.  Chest x-ray showing unchanged position of Pleurx catheter but large left-sided pleural effusion appears slightly increased.  There is also a small right pleural effusion.   Other lab work remained stable with chronic leukocytosis at 20K.  Sodium remains low at 128 but has been stable from recent labs.  [facility] were consulted to admit the patient for further evaluation and treatment.   Pulmonology has been consulted to address the  patient's loculated pleural effusion  Palliative care has been consulted, as the patient has indicated her willingness to discuss hospice with them.  Consultants  Pulmonology Palliative care  Procedures  None  Antibiotics   Anti-infectives (From admission, onward)    None      Subjective  The patient is resting quietly. No new complaints.  Objective   Vitals:  Vitals:   07/24/21 1130 07/24/21 1200  BP: 106/67 96/64  Pulse: 98 96  Resp: (!) 25 (!) 26  Temp:    SpO2: 92% 93%    Exam:  Constitutional:  The patient is awake, alert, and oriented x 3. No acute distress. Respiratory:  No increased work of breathing. Lung sounds diminished 1/3 - 1/2 from the bottom of the lung. No wheezes, rales, or rhonchi No tactile fremitus Cardiovascular:  Regular rate and rhythm No murmurs, ectopy, or gallups. No lateral PMI. No thrills. Abdomen:  Abdomen is soft, non-tender, non-distended No hernias, masses, or organomegaly Scaffoid Normoactive bowel sounds.  Musculoskeletal:  No cyanosis, clubbing, or edema Cachectic Skin:  No rashes, lesions, ulcers palpation of skin: no induration or nodules Neurologic:  CN 2-12 intact Sensation all 4 extremities intact Psychiatric:  Mental status Mood, affect appropriate Orientation to person, place, time  judgment and insight appear intact   I have personally reviewed the following:   Today's Data  Vitals  Lab Data  BMP CBC  Micro Data    Imaging  CXR  Cardiology Data  EKG  Other Data    Scheduled Meds:  amiodarone  200 mg Oral Daily   dexamethasone  4 mg Oral BID WC   docusate sodium  100 mg Oral  BID   feeding supplement  237 mL Oral BID BM   fludrocortisone  0.2 mg Oral BID WC   gabapentin  300 mg Oral QHS   lacosamide  50 mg Oral BID   letrozole  2.5 mg Oral Daily   levothyroxine  50 mcg Oral QAC breakfast   Continuous Infusions:  Principal Problem:   Recurrent left pleural effusion Active  Problems:   Adenocarcinoma of left lung, stage 4 (HCC)   Malignant neoplasm of 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 Catheter has ceased having drainage and the effusion is loculated and increased in size. Pulmonology has been consulted to evaluate the tube and perhaps deliver fibrinolytics to the loculated effusion through the catheter. I appreciate their assistance.  Hypotension- (present on admission) Chronic. Continue fludrocortisone as at home.  Hyponatremia- (present on admission) Chronic. Baseline sodium appears to be 128. 130 today. Monitor.   Hypothyroid- (present on admission) Continue synthroid as at home.  Dysphagia Chronic. SLP to evaluate.  Atrial tachycardia ([place])- (present on admission) Noted. Continue amiodarone and vimpat. 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  Vasogenic edema ([place])- (present on admission) Due to brain mets from [place]. Continue decadron as outpatient.   Protein-calorie malnutrition, severe- (present on admission) Noted. Will provide nutritional supplements.   Malignant neoplasm of lower-outer quadrant of left breast of female, estrogen receptor positive ([place]) Noted. Pt is not a candidate for systemic therapy. Palliative care has been consulted.  Adenocarcinoma of left lung, stage 4 ([place])- (present on admission) NSCLCA with mets to bone, pleura, and brain with vasogenic edema. Pleurex catheter placed for malignant pleural effusion on 01/2021. TPA injected in tube in January due to lack of drainage and loculation of the pleural effusion. She also has undergone pericardiocentesis for a  pericardial effusion. As of her last discharge from the hospital on 06/30/2021 the patient had stated her intention to cease oncological therapy and to seek holistic therapy. Upon this admission the patient is agreeable to talk to palliative care.   I have seen and examined this patient myself. I have spent 35 minutes in her evaluation and care.  DVT prophylaxis: SCD's Code Status: DNR Family Communication: None available Disposition Plan: tbd    Tami Mulkern, DO [facility] Direct contact: see [email or web address]  7PM-7AM contact night coverage as above 07/24/2021, 12:42 PM  LOS: 1 day   ADDENDUM: Dr. Shearon Stalls saw the patient. The patient is not a candidate for further TPA of Pleurex. She spoke with the patient and family regarding hospice. They are wanting to initiate inpatient hospice now. The patient was transferred to 6N and comfort orders initiated. TOC consulted for hospice placement.

## 2021-07-24 NOTE — Progress Notes (Signed)
°  Echocardiogram 2D Echocardiogram has been performed.  Tami Stone 07/24/2021, 4:33 PM

## 2021-07-24 NOTE — Assessment & Plan Note (Signed)
Noted. Will provide nutritional supplements.

## 2021-07-24 NOTE — Assessment & Plan Note (Signed)
Noted. Continue amiodarone and vimpat. 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

## 2021-07-24 NOTE — ED Notes (Signed)
RN readjusted pt in bed

## 2021-07-24 NOTE — Assessment & Plan Note (Signed)
Chronic. Baseline sodium appears to be 128. 130 today. Monitor.

## 2021-07-24 NOTE — Assessment & Plan Note (Signed)
Chronic. SLP to evaluate.

## 2021-07-24 NOTE — Assessment & Plan Note (Signed)
Continue synthroid as at home.

## 2021-07-24 NOTE — Assessment & Plan Note (Signed)
Due to brain mets from [place]. Continue decadron as outpatient.

## 2021-07-24 NOTE — Consult Note (Signed)
NAMEKaressa Stone, MRN:  [id number], DOB:  12-11-1950, LOS: 1 ADMISSION DATE:  07/23/2021, CONSULTATION DATE:  07/24/2021 REFERRING MD:  Karie Kirks, DO, CHIEF COMPLAINT:  pleural effusion   Brief History   Tami Stone is a 71 y.o. 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  She additionally has a history of stage III breast cancer which is estrogen receptor positive.  She is not on any curative treatment for any of these conditions.  Recent prolonged hospital course for malignant pericardial effusion s/p pericardiocentesis.  At that time she had clotting of a Pleurx catheter and intrapleural fibrinolytics were attempted without success.  She recently saw Dr. Marin Olp in the office and hospice was recommended.  Over the past couple of days her Pleurx catheter has not been draining.  In fact it seems that it did not drain much since she left the hospital the first time.  Her husband at the bedside and notes that she has had a decline since Monday not been eating and drinking very much, becoming progressively more weak.  On my exam today she is tachypneic, tachycardic, lethargic, and very weak.  Husband notes that she is also no longer moving all of her extremities consistently.  They came to the hospital because she was having a harder time breathing.  They were scheduled to have an outpatient palliative care appointment but this was delayed to next week.  Consults:    Procedures:   Significant Diagnostic Tests:   Micro Data:   Antimicrobials:   Interim history/subjective:   Objective   Blood pressure 103/65, pulse 95, temperature 98 F (36.7 C), temperature source Oral, resp. rate (!) 22, SpO2 94 %.       No intake or output data in the 24 hours ending 07/24/21 1317 There were no vitals filed for this visit.  Examination: Chronically ill-appearing,  fatigued Tachycardic, regular, thin, no edema, with significant muscle wasting Lung sounds are diminished bilaterally She is able to open eyes and follow commands but her attention wavers and drifts.  Chest x-ray reviewed which shows a large left-sided pleural effusion with atelectasis, the Pleurx catheter is in appropriate position. Labs show leukocytosis 18.4, hemoglobin 10.2, sodium 130, creatinine 0.37 troponin was nonelevated, BNP 122 mildly elevated.  Influenza PCR and COVID-negative.  Resolved Hospital Problem list     Assessment & Plan:  Stage 4 Adenocarcinoma of the lung Stage III Breast Cancer ER positive Malignant left sided pleural effusion s/p pleurx   Pleurx catheter is non-functional and likely could be removed.  Further attempted intrapleural fibrinolytics would not be helpful.  She is actively dying and would benefit from inpatient hospice services.  I have increased her morphine and increasing indication to include for shortness of breath.  Husband is at bedside and they are ready for inpatient hospice services.  Her main priority is to not have any suffering or shortness of breath.  I have mediated this to the hospital medicine team.  I recommend that she be transition to a 6 N. bed for ongoing palliative care services.  PCCM will see as needed, please call for further assistance.  Lenice Llamas, MD [facility] [place] 07/24/2021 1:27 PM Pager: see AMION  If no response to pager, please call critical care on call (see AMION) until 7pm After 7:00 pm call Elink     Labs   CBC:  Recent Labs  Lab 07/20/21 1432 07/23/21 2231 07/24/21 0145  WBC 23.1* 20.0* 18.4*  NEUTROABS 20.3*  --   --   HGB 12.0 11.3* 10.2*  HCT 37.5 36.2 32.1*  MCV 85.4 87.2 87.7  PLT 485* 337 887    Basic Metabolic Panel: Recent Labs  Lab 07/20/21 1432 07/23/21 2231 07/24/21 0145  NA 129* 128* 130*  K 4.1 4.3 4.5  CL 89* 96* 97*  CO2 33* 20* 23   GLUCOSE 135* 90 91  BUN 16 15 14   CREATININE 0.41* 0.43* 0.37*  CALCIUM 8.7* 8.2* 8.0*   GFR: CrCl cannot be calculated (Unknown ideal weight.). Recent Labs  Lab 07/20/21 1432 07/23/21 2231 07/24/21 0145  WBC 23.1* 20.0* 18.4*    Liver Function Tests: Recent Labs  Lab 07/20/21 1432 07/23/21 2231  AST 8* 10*  ALT 10 9  ALKPHOS 70 53  BILITOT 0.3 0.5  PROT 5.8* 5.3*  ALBUMIN 3.1* 2.4*   No results for input(s): LIPASE, AMYLASE in the last 168 hours. No results for input(s): AMMONIA in the last 168 hours.  ABG    Component Value Date/Time   TCO2 27 05/28/2021 0729     Coagulation Profile: No results for input(s): INR, PROTIME in the last 168 hours.  Cardiac Enzymes: No results for input(s): CKTOTAL, CKMB, CKMBINDEX, TROPONINI in the last 168 hours.  HbA1C: Hgb A1c MFr Bld  Date/Time Value Ref Range Status  07/01/2021 12:50 AM 5.5 4.8 - 5.6 % Final    Comment:    (NOTE) Pre diabetes:          5.7%-6.4%  Diabetes:              >6.4%  Glycemic control for   <7.0% adults with diabetes   06/25/2021 04:55 AM 5.4 4.8 - 5.6 % Final    Comment:    (NOTE) Pre diabetes:          5.7%-6.4%  Diabetes:              >6.4%  Glycemic control for   <7.0% adults with diabetes     CBG: No results for input(s): GLUCAP in the last 168 hours.

## 2021-07-24 NOTE — ED Notes (Signed)
Patient transported to echo ?

## 2021-07-24 NOTE — Progress Notes (Incomplete)
PROGRESS NOTE  Tami Stone HYW:[id number] DOB: 1950-07-02 DOA: 07/23/2021 PCP: Tami Cage, MD  Brief History   Tami Stone is a 71 y.o. 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   Recently admitted from 1/18-1/26 with cardiac tamponade from malignant pericardial effusion s/p pericardiocentesis with over 1L removed. Report echo on 1/23 shows trivial pericardial effusion.  She also had recurrent malignant and loculated pleural effusion and malfunctioning Pleurx catheter  She followed up with oncology Dr. Marin Olp on 2/1 and was deemed not a candidate for systemic treatment.  The subject of hospice was discussed but patient and husband was not yet ready for that. However due to worsening symptoms of shortness of breath in the last few days they appointment now set up with palliative outpatient.   She has also been having hypotension and was recently started on fludrocortisone but has been noting increasing lower extremity edema.  She now takes it as needed when systolic is noted to be below 100.   In the ED, she was afebrile and hypotensive with systolic in the 48N but stable on room air.  Chest x-ray showing unchanged position of Pleurx catheter but large left-sided pleural effusion appears slightly increased.  There is also a small right pleural effusion.   Other lab work remained stable with chronic leukocytosis at 20K.  Sodium remains low at 128 but has been stable from recent labs.  [facility] were consulted to admit the patient for further evaluation and treatment.  Pulmonology has been consulted to address the  loculated pleural effusion and lack of drainage from Pleurex catheter.  As the patient has agreed to talk with palliative care again, they have been consulted.  Consultants  Pulmonology  Procedures  None  Antibiotics   Anti-infectives (From admission, onward)    None      Subjective  The patient is resting quietly. No new complaints.  Objective   Vitals:  Vitals:   07/24/21 1100 07/24/21 1130  BP: 111/73 106/67  Pulse: 99 98  Resp: (!) 27 (!) 25  Temp:    SpO2: 92% 92%    Exam:  Constitutional:  The patient is awake, alert, and oriented x 3. No acute distress. Respiratory:  No increased work of breathing. No wheezes, rales, or rhonchi No tactile fremitus Cardiovascular:  Regular rate and rhythm No murmurs, ectopy, or gallups. No lateral PMI. No thrills. Abdomen:  Abdomen is soft, non-tender, non-distended No hernias, masses, or organomegaly Normoactive bowel sounds.  Musculoskeletal:  No cyanosis, clubbing, or edema Skin:  No rashes, lesions, ulcers palpation of skin: no induration or nodules Neurologic:  CN 2-12 intact Sensation all 4 extremities intact Psychiatric:  Mental status Mood, affect appropriate Orientation to person, place, time  judgment and insight appear intact   I have personally reviewed the following:   Today's Data  Vitals  Lab Data  BMP CBC  Micro Data    Imaging  CXR  Cardiology Data  EKG  Other Data    Scheduled Meds:  amiodarone  200 mg Oral Daily   dexamethasone  4 mg Oral BID WC   docusate sodium  100 mg Oral BID   feeding supplement  237 mL Oral BID BM   fludrocortisone  0.2 mg Oral BID WC   gabapentin  300 mg Oral QHS   lacosamide  50 mg Oral BID   letrozole  2.5 mg Oral Daily   levothyroxine  50 mcg Oral QAC breakfast   Continuous Infusions:  Active Problems:   Adenocarcinoma of left lung, stage 4 (HCC)   Malignant neoplasm of lower-outer quadrant of left breast of female, estrogen receptor  positive (HCC)   Recurrent left pleural effusion   Protein-calorie malnutrition, severe   Atrial tachycardia (HCC)   Dysphagia   Hypothyroid   Hyponatremia   Hypotension   LOS: 1 day   A & P  ***  ***  DVT prophylaxis: *** Code Status: *** Family Communication: *** Disposition Plan: ***    Tami Golladay, DO [facility] Direct contact: see [email or web address]  7PM-7AM contact night coverage as above 07/24/2021, 11:58 AM  LOS: 1 day

## 2021-07-24 NOTE — Assessment & Plan Note (Signed)
Noted. Pt is not a candidate for systemic therapy. Palliative care has been consulted.

## 2021-07-24 NOTE — Assessment & Plan Note (Signed)
Chronic. Continue fludrocortisone as at home.

## 2021-07-24 NOTE — Consult Note (Addendum)
Consultation Note Date: 07/24/2021   Patient Name: [place]  DOB: [birth year]  MRN: [id number]  Age / Sex: 71 y.o., female  PCP: Tami Cage, MD Referring Physician: Karie Kirks, DO  Reason for Consultation: Establishing goals of care "metastatic cancer, failure to thrive"  HPI/Patient Profile: 71 y.o. female  with past medical history of invasive ductal carcinoma of left breast on Femara, metastatic lung cancer with mets to bone and brain, hype 2 diabetes, chronic large left-sided pleural effusion, pericardial effusion, atrial fibrillation on amiodarone, anemia, and hypothyroidism. She presented to the emergency department on 07/23/2021 with worsening shortness of breath and minimal drainage from pleurX catheter. In the ED, chest x-ray showed unchanged position of Pleurx catheter but large left-sided pleural effusion appears slightly increased. There is also a small right pleural effusion.  Admitted to [place] with persistent malignant and loculated pleural effusion and malfunctioning Pleurx catheter. Seen by [place], who states that Pleurx is non-functional and further intrapleural fibrinolytics would not be helpful.   Recently admitted 1/18 - 1/26 with cardiac tamponade from malignant pericardial effusion s/p pericardiocentesis  She followed up with oncology Dr. [place] on 2/1 and was deemed not a candidate for systemic treatment. Hospice was discussed but patient was not ready at that time.     Clinical Assessment and Goals of Care: I have reviewed medical records including EPIC notes, labs and imaging. I went to see patient in the ED to discuss diagnosis, prognosis, GOC, EOL wishes, disposition, and options.  Patient is known to [place] from her previous hospitalization in January. I re-introduced Palliative Medicine as specialized medical care for people living with serious illness. It focuses on providing  relief from the symptoms and stress of a serious illness.   We discussed a brief life review of the patient. [place] originally [place], [place]. She relocated to [place] in [place]. She has 2 children from her previous marriage. Son [place] lives in the [place] area. Daughter [place] lives in [place]. She has 6 grandchildren.  [name] lives with her husband [place] in their home. He has been her primary caregiver. She does have a home health RN that comes several times per week. As far as functional status, there has ben significant decline over the past few weeks. [place] reports feeling very weak. It has become difficult to transfer. She spends most of her time in the recliner as she becomes short of breath when lying down.    We discussed her current medical condition. [name] understands that her condition is terminal and she is dying. She tells me she is at peace with this.   The difference between full scope medical intervention and comfort care was considered. Reviewed the concept of a comfort path to family, emphasizing that this path involves de-escalating and stopping full scope medical interventions, allowing a natural course to occur. Discussed that the goal is comfort and dignity rather than cure/prolonging life. [name] reports that she is ready to focus on comfort and wishes to go home with hospice.  Patient's son [place] returns to bedside during our conversation. There is some discussion regarding whether interventions to remove/drain fluid would be of benefit.  Discussed [name]'s time is limited and that the malignant effusions will continue to be recurrent despite any interventions. Also discussed that per pulmonary, pleurx catheter is not working and should be removed. [place] ultimately states that he supports his mother's decision to go home with hospice.  Created space and opportunity for patient and family to express thoughts and feelings regarding patient's current medical  situation. Emotional support provided   Questions and concerns were addressed.  The family was encouraged to call with questions or concerns.    Primary decision maker: Patient    SUMMARY OF RECOMMENDATIONS   Stage IV lung cancer metastatic to bone and brain, with malignant pericardial and pleural effusions   plan for discharge home with Hospice On discharge, would recommend scripts for: - Hydromorphone liquid 1mg /ml: 2mg  (55ml) po every 2 hours as needed for pain or shortness of breath: Disp 120 ml - Lorazepam 1mg  tablet by mouth every 4 hours as needed for anxiety: Disp 30 tablets TOC order placed for hospice referral - [place]   Code Status/Advance Care Planning: DNR   Prognosis:  < 3 months  Discharge Planning: Home with Hospice      Primary Diagnoses: Present on Admission:  Adenocarcinoma of left lung, stage 4 (HCC)  Recurrent left pleural effusion  Protein-calorie malnutrition, severe  Atrial tachycardia ([place])  Hypothyroid  Hyponatremia  Hypotension  Vasogenic edema ([place])   I have reviewed the medical record, interviewed the patient and family, and examined the patient. The following aspects are pertinent.  Past Medical History:  Diagnosis Date   Diabetes ([place])    Does not check bs at home   Dyspnea    High cholesterol    Lung cancer ([place])    04/2020     Family History  Problem Relation Age of Onset   Breast cancer Sister        half sister on maternal side   Colon cancer Neg Hx    Esophageal cancer Neg Hx    Scheduled Meds:  amiodarone  200 mg Oral Daily   dexamethasone  4 mg Oral BID WC   docusate sodium  100 mg Oral BID   feeding supplement  237 mL Oral BID BM   fludrocortisone  0.2 mg Oral BID WC   gabapentin  300 mg Oral QHS   lacosamide  50 mg Oral BID   letrozole  2.5 mg Oral Daily   levothyroxine  50 mcg Oral QAC breakfast   Continuous Infusions: PRN Meds:.morphine injection   No Known Allergies Review of Systems   Respiratory:  Positive for shortness of breath.   Neurological:  Positive for weakness.   Physical Exam Vitals reviewed.  Constitutional:      General: She is not in acute distress.    Appearance: She is cachectic. She is ill-appearing.  Pulmonary:     Effort: Pulmonary effort is normal.  Neurological:     Mental Status: She is alert and oriented to person, place, and time.     Motor: Weakness present.    Vital Signs: BP 99/61    Pulse 98    Temp 98 F (36.7 C) (Oral)    Resp (!) 24    SpO2 92%  Pain Scale: 0-10   Pain Score: 0-No pain   SpO2: SpO2: 92 % O2 Device:SpO2: 92 % O2 Flow Rate: .  IO: Intake/output summary: No intake or output data in the 24 hours ending 07/24/21 1445      Palliative Assessment/Data: PPS 20%    MDM - High due to: 1 or more chronic illnesses with severe exacerbation, progression, or side effects of treatment OR acute or chronic illness or injury that poses a threat to life or bodily function Review of prior external notes, review of test results, assessment requiring an independent historian Discussion or decision not to resuscitate or to de-escalate care because poor prognosis  Signed by: Lavena Bullion, NP   Please contact Palliative Medicine Team phone at [phone] for questions and concerns.  For individual provider: See [name]

## 2021-07-24 NOTE — ED Notes (Signed)
Attempted to obtain EKG on monitor, not all leads would show up. Utilizing other methods to obtain EKG.

## 2021-07-24 NOTE — Assessment & Plan Note (Signed)
S/P Pleurex catheter placement and instillation of TPA x 1. Catheter has ceased having drainage and the effusion is loculated and increased in size. Pulmonology has been consulted to evaluate the tube and perhaps deliver fibrinolytics to the loculated effusion through the catheter. I appreciate their assistance.

## 2021-07-24 NOTE — ED Notes (Signed)
Pt eating breakfast 

## 2021-07-24 NOTE — Assessment & Plan Note (Signed)
NSCLCA with mets to bone, pleura, and brain with vasogenic edema. Pleurex catheter placed for malignant pleural effusion on 01/2021. TPA injected in tube in January due to lack of drainage and loculation of the pleural effusion. She also has undergone pericardiocentesis for a pericardial effusion. As of her last discharge from the hospital on 06/30/2021 the patient had stated her intention to cease oncological therapy and to seek holistic therapy. Upon this admission the patient is agreeable to talk to palliative care.

## 2021-07-24 NOTE — ED Notes (Signed)
Pt states she does not need morphine at this time.

## 2021-07-25 ENCOUNTER — Encounter (HOSPITAL_COMMUNITY): Payer: Self-pay | Admitting: Internal Medicine

## 2021-07-25 ENCOUNTER — Other Ambulatory Visit: Payer: Self-pay

## 2021-07-25 DIAGNOSIS — R0600 Dyspnea, unspecified: Secondary | ICD-10-CM | POA: Diagnosis not present

## 2021-07-25 DIAGNOSIS — J90 Pleural effusion, not elsewhere classified: Secondary | ICD-10-CM | POA: Diagnosis not present

## 2021-07-25 DIAGNOSIS — E43 Unspecified severe protein-calorie malnutrition: Secondary | ICD-10-CM | POA: Diagnosis not present

## 2021-07-25 DIAGNOSIS — C3490 Malignant neoplasm of unspecified part of unspecified bronchus or lung: Secondary | ICD-10-CM | POA: Diagnosis not present

## 2021-07-25 MED ORDER — LORAZEPAM 0.5 MG PO TABS: 0.5000 mg | ORAL_TABLET | ORAL | Status: DC | PRN

## 2021-07-25 MED ORDER — MORPHINE SULFATE (PF) 2 MG/ML IV SOLN
1.0000 mg | INTRAVENOUS | Status: DC | PRN
  Administered 2021-07-25: 2 mg via INTRAVENOUS
  Administered 2021-07-25: 1 mg via INTRAVENOUS
  Administered 2021-07-26 (×2): 2 mg via INTRAVENOUS
  Filled 2021-07-25 (×5): qty 1

## 2021-07-25 MED ORDER — MORPHINE SULFATE (CONCENTRATE) 20 MG/ML PO SOLN: 2.5000 mg | ORAL | 0 refills | Status: AC | PRN

## 2021-07-25 MED ORDER — SCOPOLAMINE 1 MG/3DAYS TD PT72
1.0000 | MEDICATED_PATCH | TRANSDERMAL | Status: DC
  Administered 2021-07-25: 1.5 mg via TRANSDERMAL
  Filled 2021-07-25: qty 1

## 2021-07-25 MED ORDER — LORAZEPAM 0.5 MG PO TABS: 0.5000 mg | ORAL_TABLET | ORAL | 0 refills | Status: AC | PRN

## 2021-07-25 MED ORDER — LORAZEPAM 2 MG/ML IJ SOLN: 1.0000 mg | INTRAMUSCULAR | Status: DC | PRN

## 2021-07-25 NOTE — Discharge Instructions (Signed)
[email or web address]  Go this website for information about ordering a home purewick or call [phone]

## 2021-07-25 NOTE — Progress Notes (Signed)
[facility])  Referral received for hospice services at home.  Met with pt at bedside, provided support.  DME needed: O2, bed, suction for purewick (although our suction does not connect with the purewick nor do we provide the purewick- explained to patient).  DME should be delivered Monday.  She will need ambulance transport home and comfort meds arranged.  Please send completed DNR.  Venia Carbon BSN, RN [facility] Liaison

## 2021-07-25 NOTE — Plan of Care (Signed)
°  Problem: Education: Goal: Knowledge of General Education information will improve Description: Including pain rating scale, medication(s)/side effects and non-pharmacologic comfort measures Outcome: Progressing   Problem: Coping: Goal: Level of anxiety will decrease Outcome: Progressing   Problem: Elimination: Goal: Will not experience complications related to bowel motility Outcome: Progressing   Problem: Pain Managment: Goal: General experience of comfort will improve Outcome: Progressing   Problem: Safety: Goal: Ability to remain free from injury will improve Outcome: Progressing   Problem: Skin Integrity: Goal: Risk for impaired skin integrity will decrease Outcome: Progressing

## 2021-07-25 NOTE — Progress Notes (Signed)
Daily Progress Note   Patient Name: [place]       Date: 07/25/2021 DOB: [birth year]  Age: 71 y.o. MRN#: [id number] Attending Physician: Karie Kirks, DO Primary Care Physician: Gaynelle Cage, MD Admit Date: 07/23/2021    HPI/Patient Profile: 71 y.o. female  with past medical history of invasive ductal carcinoma of left breast on Femara, metastatic lung cancer with mets to bone and brain, hype 2 diabetes, chronic large left-sided pleural effusion, pericardial effusion, atrial fibrillation on amiodarone, anemia, and hypothyroidism. She presented to the emergency department on 07/23/2021 with worsening shortness of breath and minimal drainage from pleurX catheter. In the ED, chest x-ray showed unchanged position of Pleurx catheter but large left-sided pleural effusion appears slightly increased. There is also a small right pleural effusion.  Admitted to [facility] with persistent malignant and loculated pleural effusion and malfunctioning Pleurx catheter. Seen by PPCM, who states that Pleurx is non-functional and further intrapleural fibrinolytics would not be helpful.   Subjective: Chart reviewed. Echo showed small pericardial effusion with no tamponade.   Update received from bedside RN. Patient has been hesitant to take pain medication because it makes her sleep for several hours and she wants to remain awake as much as possible to be present with her family.   I went to see patient at bedside. She reports mild dyspnea. Her daughter Erline Levine is at bedside; she traveled in last night from [place].   We reviewed plan for home with hospice. Patient and daughter express interest in obtaining purewick external catheter for home use. I reached out to [facility], RN case manager who has included contact  information for purewick on the discharge instructions.   Provided education on the indications for opioids and benzodiazepines to relieve pain, dyspnea, and anxiety in the setting of end-stage illness. Discussed the appropriate dosage will hopefully help her fell better but not be over-sedating so they she can be awake/aware to enjoy time with her family.    Objective:  Physical Exam Vitals reviewed.  Constitutional:      General: She is not in acute distress.    Appearance: She is cachectic. She is ill-appearing.  Pulmonary:     Effort: Pulmonary effort is normal.  Neurological:     Mental Status: She is alert and oriented to person, place, and time.  Motor: Weakness present.            Vital Signs: BP 107/75 (BP Location: Left Arm)    Pulse 80    Temp 97.7 F (36.5 C)    Resp 17    Ht 5\' 2"  (1.575 m)    Wt 53.4 kg    SpO2 96%    BMI 21.53 kg/m  SpO2: SpO2: 96 % O2 Device: O2 Device: Room Air O2 Flow Rate:      LBM: Last BM Date: 07/23/21 Baseline Weight: Weight: 53.4 kg Most recent weight: Weight: 53.4 kg       Palliative Assessment/Data: PPS 20%      Palliative Care Assessment & Plan   Assessment: - adenocarcinoma of left lung, with mets to bone, pleura, and brain - recurrent malignant pleural effusion, Pleurx drain not functioning - malignant breast cancer - dyspnea - protein calorie malnutrition, severe  Recommendations/Plan: Discharge home with hospice tomorrow  Scripts sent for discharge: Morphine sulfate (CONCENTRATE) 20 mg/ml solution:Take 2.5-5 mg (0.125 to 0.25 ml) every 3 hours as needed for moderate pain, severe pain, or dyspnea; Dispense - 30 ml Lorazepam (ATIVAN) 0.5 mg tablet: take 0.5-1 mg (1-2 tablets) every 4 hours as needed for anxiety or sleep; Disp- 20 tablets  Code Status: DNR/DNI  Prognosis:  < 3 months  Discharge Planning: Home with Hospice   Thank you for allowing the Palliative Medicine Team to assist in the care of this  patient.  MDM - High    Lavena Bullion, NP  Please contact Palliative Medicine Team phone at [phone] for questions and concerns.

## 2021-07-26 DIAGNOSIS — J90 Pleural effusion, not elsewhere classified: Secondary | ICD-10-CM | POA: Diagnosis not present

## 2021-07-26 MED ORDER — SCOPOLAMINE 1 MG/3DAYS TD PT72
1.0000 | MEDICATED_PATCH | TRANSDERMAL | 12 refills | Status: AC
Start: 2021-07-28 — End: ?

## 2021-07-26 MED ORDER — ENSURE ENLIVE PO LIQD: 237.0000 mL | Freq: Two times a day (BID) | ORAL | 12 refills | Status: AC

## 2021-07-26 NOTE — Progress Notes (Signed)
Patient discharged home in stable condition. Verbalizes understanding of all discharge instructions, including home medications and follow up appointments. 

## 2021-07-26 NOTE — Progress Notes (Signed)
Nutrition Brief Note  Patient identified on the Malnutrition Screening Tool (MST) report for weight loss and poor appetite. Chart reviewed. Pt now transitioned to comfort care with plans to discharge home with hospice services.  No nutrition interventions planned at this time. Would, however, recommend a continued liberal diet and to allow pt to consume foods, snacks, and supplements as she desires for comfort.  Please re-consult as needed.   Ranell Patrick, RD, LDN Clinical Dietitian RD pager # available in [place]  After hours/weekend pager # available in [facility]

## 2021-07-26 NOTE — Progress Notes (Signed)
2/13 Pt is under comfort care measures, but alert at this time. IM Letter explained and understood by patient, verbal consent given at which time a signature was not requested.

## 2021-07-26 NOTE — TOC Progression Note (Signed)
Transition of Care [facility]) - Progression Note    Patient Details  Name: [place] MRN: [id number] Date of Birth: 1950-06-17  Transition of [facility]) CM/SW Contact  Bartholomew Crews, RN Phone Number: [phone] 07/26/2021, 8:33 AM  Clinical Narrative:   (Late entry from 2/12)  TOC following for transition needs. Patient to transition home with hospice. Purewick information provided on AVS. This is private pay item. No family at bedside when this NCM stopped by room - patient sleeping.       Expected Discharge Plan and Services                                                 Social Determinants of Health (SDOH) Interventions    Readmission Risk Interventions No flowsheet data found.

## 2021-07-26 NOTE — Care Management Important Message (Signed)
Important Message  Patient Details  Name: [place] MRN: [id number] Date of Birth: 15-Dec-1950   Medicare Important Message Given:  Other (see comment)     Hannah Beat 07/26/2021, 12:28 PM

## 2021-07-26 NOTE — Progress Notes (Signed)
PROGRESS NOTE  [place] GYF:[id number] DOB: 1951/01/14 DOA: 07/23/2021 PCP: Gaynelle Cage, MD  Brief History    [place] is a 71 y.o. 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   Recently admitted from 1/18-1/26 with cardiac tamponade from malignant pericardial effusion s/p pericardiocentesis with over 1L removed. Report echo on 1/23 shows trivial pericardial effusion.  She also had recurrent malignant and loculated pleural effusion and malfunctioning Pleurx catheter  She followed up with oncology Dr. Marin Olp on 2/1 and was deemed not a candidate for systemic treatment.  The subject of hospice was discussed but patient and husband was not yet ready for that. However due to worsening symptoms of shortness of breath in the last few days they appointment now set up with palliative outpatient.   She has also been having hypotension and was recently started on fludrocortisone but has been noting increasing lower extremity edema.  She now takes it as needed when systolic is noted to be below 100.   In the ED, she was afebrile and hypotensive with systolic in the 91M but stable on room air.  Chest x-ray showing unchanged position of Pleurx catheter but large left-sided pleural effusion appears slightly increased.  There is also a small right pleural effusion.   Other lab work remained stable with chronic leukocytosis at 20K.  Sodium remains low at 128 but has been stable from recent labs.  [facility] were consulted to admit the patient for further evaluation and treatment.   Pulmonology has been consulted to address the  patient's loculated pleural effusion  Dr. Shearon Stalls saw the patient. The patient is not a candidate for further TPA of Pleurex. She spoke with the patient and family regarding hospice. They are wanting to initiate inpatient hospice now. The patient was transferred to 6N and comfort orders initiated. TOC consulted for hospice placement.  Palliative care has been consulted, as the patient has indicated her willingness to discuss hospice with them. She and her family have decided to go home with hospice. Comfort care has been initiated here.   Consultants  Pulmonology Palliative care  Procedures  None  Antibiotics   Anti-infectives (From admission, onward)    None      Subjective  The patient is resting quietly. No new complaints.  Objective   Vitals:  Vitals:   07/25/21 0414 07/25/21 2100  BP: 107/75 109/68  Pulse: 80 83  Resp: 17 20  Temp: 97.7 F (36.5 C) 98 F (36.7 C)  SpO2: 96% 96%    Exam:  Constitutional:  The patient is awake, alert, and oriented x 3. No acute distress. Respiratory:  No increased work of breathing. Lung sounds diminished 1/3 - 1/2 from the bottom of the lung. No wheezes, rales, or rhonchi No tactile fremitus Cardiovascular:  Regular rate and rhythm No murmurs, ectopy, or gallups. No lateral PMI. No thrills. Abdomen:  Abdomen is soft, non-tender, non-distended No hernias, masses, or organomegaly Scaffoid Normoactive bowel sounds.  Musculoskeletal:  No cyanosis, clubbing, or edema Cachectic Skin:  No rashes, lesions, ulcers palpation of skin: no induration or nodules Neurologic:  CN 2-12 intact Sensation all 4 extremities intact Psychiatric:  Mental status Mood, affect appropriate Orientation to person, place, time  judgment and insight appear intact   I have personally reviewed the following:   Today's Data  Vitals  Lab Data  BMP CBC  Micro Data    Imaging  CXR  Cardiology Data  EKG  Other Data    Scheduled  Meds:  dexamethasone  4 mg Oral BID WC   docusate sodium  100 mg Oral BID   feeding supplement  237 mL Oral BID BM   gabapentin  300 mg Oral QHS   lacosamide  50 mg Oral BID   scopolamine  1 patch Transdermal Q72H   Continuous Infusions:  Principal Problem:   Recurrent left pleural effusion Active Problems:   Adenocarcinoma of left lung, stage 4 (HCC)   Malignant neoplasm of lower-outer quadrant of left breast of female, estrogen receptor positive (HCC)   Protein-calorie malnutrition, severe   Vasogenic edema (HCC)   Atrial tachycardia (HCC)   Dysphagia   Hypothyroid   Hyponatremia   Hypotension   Lung cancer metastatic to brain ([place])   LOS: 3 days   A & P  Assessment and Plan: * Recurrent left pleural effusion- (present on admission) S/P Pleurex catheter placement and instillation of TPA x 1. Catheter has ceased having drainage and the effusion is loculated and increased in size. Pulmonology has been consulted to evaluate the tube and perhaps deliver fibrinolytics to the loculated effusion through the catheter. I appreciate their assistance.  Hypotension- (present on admission) Chronic. Continue fludrocortisone as at home.  Hyponatremia- (present on admission) Chronic. Baseline sodium appears to be 128. 130 today. Monitor.   Hypothyroid- (present on admission) Continue synthroid as at home.  Dysphagia Chronic. SLP to evaluate.  Atrial tachycardia ([place])- (present on admission) Noted. Continue amiodarone and vimpat. 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  Vasogenic edema ([place])- (present on admission) Due to brain mets from [place]. Continue decadron as outpatient.   Protein-calorie malnutrition, severe- (present on admission) Noted. Will provide nutritional supplements.   Malignant neoplasm of lower-outer quadrant of left breast of female, estrogen receptor positive ([place]) Noted. Pt is not a candidate for systemic therapy. Palliative care has been  consulted.  Adenocarcinoma of left lung, stage 4 ([place])- (present on admission) NSCLCA with mets to bone, pleura, and brain with vasogenic edema. Pleurex catheter placed for malignant pleural effusion on 01/2021. TPA injected in tube in January due to lack of drainage and loculation of the pleural effusion. She also has undergone pericardiocentesis for a pericardial effusion. As of her last discharge from the hospital on 06/30/2021 the patient had stated her intention to cease oncological therapy and to seek holistic therapy. Upon this admission the patient is agreeable to talk to palliative care.   I have seen and examined this patient myself. I have spent 30 minutes in her evaluation and care.  DVT prophylaxis: SCD's Code Status: DNR Family Communication: None available Disposition Plan: Home with hospice   Tiffanee Mcnee, DO [facility] Direct contact: see [email or web address]  7PM-7AM contact night coverage as above 07/25/2021, 4:42 PM  LOS: 1 day

## 2021-07-26 NOTE — Discharge Summary (Signed)
Physician Discharge Summary   Patient: Tami Stone MRN: [id number] DOB: 1951-01-17  Admit date:     07/23/2021  Discharge date: 07/26/21  Discharge Physician: Karie Kirks   PCP: Gaynelle Cage, MD   Recommendations at discharge:    Discharge to home with home hospice.  Discharge Diagnoses: Principal Problem:   Recurrent left pleural effusion Active Problems:   Adenocarcinoma of left lung, stage 4 (HCC)   Malignant neoplasm of lower-outer quadrant of left breast of female, estrogen receptor positive (HCC)   Protein-calorie malnutrition, severe   Vasogenic edema (HCC)   Atrial tachycardia (HCC)   Dysphagia   Hypothyroid   Hyponatremia   Hypotension   Lung cancer metastatic to brain [facility])  Resolved Problems:   * No resolved hospital problems. *   Hospital Course: Tami Stone is a 71 y.o. 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   Recently admitted from 1/18-1/26 with cardiac tamponade from malignant pericardial effusion s/p pericardiocentesis with over 1L removed. Report echo on 1/23 shows trivial pericardial effusion.  She also had recurrent malignant and loculated pleural effusion and malfunctioning Pleurx catheter  She followed up with oncology Dr. Marin Olp on 2/1 and was deemed not a candidate for systemic treatment.  The subject of hospice was discussed but patient and husband was not yet ready for that. However due to worsening symptoms of shortness of breath in the last few days they appointment now set up with palliative outpatient.   She has also been having hypotension and was recently started on  fludrocortisone but has been noting increasing lower extremity edema.  She now takes it as needed when systolic is noted to be below 100.   In the ED, she was afebrile and hypotensive with systolic in the 36U but stable on room air.  Chest x-ray showing unchanged position of Pleurx catheter but large left-sided pleural effusion appears slightly increased.  There is also a small right pleural effusion.   Other lab work remained stable with chronic leukocytosis at 20K.  Sodium remains low at 128 but has been stable from recent labs.   [facility] hospitalists were consulted to admit the patient for further evaluation and treatment.    Pulmonology has been consulted to address the patient's loculated pleural effusion   Dr. Shearon Stalls saw the patient. The patient is not a candidate for further TPA of Pleurex. She spoke with the patient and family regarding hospice. They are wanting to initiate inpatient hospice now. The patient was transferred to 6N and comfort orders initiated. TOC consulted for hospice placement.   Palliative care has been consulted, as the patient has indicated her willingness to discuss hospice with them. She and her family have decided to go home with hospice. Comfort care has been initiated here.   The patient will be discharged to home today with hospice for end of life care.  Assessment and Plan: * Recurrent left pleural effusion- (present on admission) S/P Pleurex catheter placement and instillation of TPA x 1. Catheter has ceased having drainage and the effusion is loculated and increased in size. Pulmonology has been consulted to evaluate the tube and perhaps deliver fibrinolytics to the loculated effusion through the catheter. I appreciate their assistance.  Hypotension- (present  on admission) Chronic. Continue fludrocortisone as at home.  Hyponatremia- (present on admission) Chronic. Baseline sodium appears to be 128. 130 today. Monitor.   Hypothyroid- (present on  admission) Continue synthroid as at home.  Dysphagia Chronic. SLP to evaluate.  Atrial tachycardia ([place])- (present on admission) Noted. Continue amiodarone and vimpat. 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  Vasogenic edema ([place])- (present on admission) Due to brain mets from [place]. Continue decadron as outpatient.   Protein-calorie malnutrition, severe- (present on admission) Noted. Will provide nutritional supplements.   Malignant neoplasm of lower-outer quadrant of left breast of female, estrogen receptor positive ([place]) Noted. Pt is not a candidate for systemic therapy. Palliative care has been consulted.  Adenocarcinoma of left lung, stage 4 ([place])- (present on admission) NSCLCA with mets to bone, pleura, and brain with vasogenic edema. Pleurex catheter placed for malignant pleural effusion on 01/2021. TPA injected in tube in January due to lack of drainage and loculation of the pleural effusion. She also has undergone pericardiocentesis for a pericardial effusion. As of her last discharge from the hospital on 06/30/2021 the patient had stated her intention to cease oncological therapy and to seek holistic therapy. Upon this admission the patient is agreeable to talk to palliative care.   I have seen and examined this patient myself. I have spent 38 minutes in her evaluation and discharge.  Consultants: Pulmonary Critical care   Palliative Care   Hospice Procedures performed: None  Disposition:  Home with hospice Diet recommendation:  Discharge Diet Orders (From admission, onward)     Start     Ordered   07/26/21 0000  Diet general        07/26/21 1413           Regular diet  DISCHARGE MEDICATION: Allergies as of 07/26/2021   No Known Allergies      Medication List     STOP taking these medications    amiodarone 200 MG tablet Commonly known as: PACERONE   Apricot Flavor Powd   CO Q 10 PO   DANDELION PO   fludrocortisone 0.1 MG  tablet Commonly known as: FLORINEF   IODINE (KELP) PO   lactulose 10 GM/15ML solution Commonly known as: CHRONULAC   letrozole 2.5 MG tablet Commonly known as: FEMARA   levothyroxine 50 MCG tablet Commonly known as: Synthroid   multivitamin-prenatal 27-0.8 MG Tabs tablet   TURMERIC PLUS BLACK PEPPER EXT PO   Vitamin D 125 MCG (5000 UT) Caps   Zinc 100 MG Tabs       TAKE these medications    acetaminophen 325 MG tablet Commonly known as: TYLENOL Take 2 tablets (650 mg total) by mouth every 4 (four) hours as needed for mild pain, moderate pain, fever or headache.   dexamethasone 4 MG tablet Commonly known as: DECADRON Take 1 tablet (4 mg total) by mouth every 12 (twelve) hours.   docusate sodium 100 MG capsule Commonly known as: COLACE Take 1 capsule (100 mg total) by mouth 2 (two) times daily.   feeding supplement Liqd Take 237 mLs by mouth 2 (two) times daily between meals.   gabapentin 300 MG capsule Commonly known as: NEURONTIN Take 1 capsule (300 mg total) by mouth at bedtime.   lacosamide 50 MG Tabs tablet Commonly known as: VIMPAT Take 1 tablet (50 mg total) by mouth 2 (two) times daily.   LORazepam 0.5 MG tablet Commonly known as: ATIVAN Take 1-2 tablets (0.5-1 mg total) by mouth every  4 (four) hours as needed for anxiety or sleep.   morphine 20 MG/ML concentrated solution Commonly known as: ROXANOL Take 0.13-0.25 mLs (2.6-5 mg total) by mouth every 3 (three) hours as needed for severe pain, moderate pain or shortness of breath.   OVER THE COUNTER MEDICATION Take 1 tablet by mouth daily as needed (constipation). "Veggie lax"   polyethylene glycol 17 g packet Commonly known as: MIRALAX / GLYCOLAX Take 17 g by mouth daily.   scopolamine 1 MG/3DAYS Commonly known as: TRANSDERM-SCOP Place 1 patch (1.5 mg total) onto the skin every 3 (three) days. Start taking on: July 28, 2021               Durable Medical Equipment  (From  admission, onward)           Start     Ordered   07/25/21 1246  For home use only DME Hospital bed  Once       Question Answer Comment  Length of Need Lifetime   The above medical condition requires: Patient requires the ability to reposition frequently   Bed type Semi-electric      07/25/21 1245             Discharge Exam: Filed Weights   07/25/21 0448  Weight: 53.4 kg   Vitals:   07/25/21 0414 07/25/21 2100  BP: 107/75 109/68  Pulse: 80 83  Resp: 17 20  Temp: 97.7 F (36.5 C) 98 F (36.7 C)  SpO2: 96% 96%   Exam:   Constitutional:  The patient is awake, alert, and oriented x 3. No acute distress. Respiratory:  No increased work of breathing. Lung sounds diminished 1/3 - 1/2 from the bottom of the lung. No wheezes, rales, or rhonchi No tactile fremitus Cardiovascular:  Regular rate and rhythm No murmurs, ectopy, or gallups. No lateral PMI. No thrills. Abdomen:  Abdomen is soft, non-tender, non-distended No hernias, masses, or organomegaly Scaffoid Normoactive bowel sounds.  Musculoskeletal:  No cyanosis, clubbing, or edema Cachectic Skin:  No rashes, lesions, ulcers palpation of skin: no induration or nodules Neurologic:  CN 2-12 intact Sensation all 4 extremities intact Psychiatric:  Mental status Mood, affect appropriate Orientation to person, place, time  judgment and insight appear intact   Condition at discharge: serious  The results of significant diagnostics from this hospitalization (including imaging, microbiology, ancillary and laboratory) are listed below for reference.   Imaging Studies: DG Chest 2 View  Result Date: 06/30/2021 CLINICAL DATA:  Short of breath and weakness.  Lung cancer. EXAM: CHEST - 2 VIEW COMPARISON:  06/23/2021 FINDINGS: Left basilar chest tube in place. No pneumothorax. Near complete opacification left chest due to effusion and tumor and collapse left lung. Small amount of aerated lung on the left. Heart  is displaced to the right. Possible pericardial effusion. Minimal right pleural effusion.  Right lung otherwise clear. IMPRESSION: Progressive opacification left chest due to fluid, tumor, and collapse. Left chest tube in place. No pneumothorax Heart displaced to the right.  Question pericardial effusion Electronically Signed   By: Franchot Gallo M.D.   On: 06/30/2021 11:22   CT Angio Chest PE W and/or Wo Contrast  Result Date: 06/30/2021 CLINICAL DATA:  Episode of shortness of breath. Known stage IV lung cancer. EXAM: CT ANGIOGRAPHY CHEST WITH CONTRAST TECHNIQUE: Multidetector CT imaging of the chest was performed using the standard protocol during bolus administration of intravenous contrast. Multiplanar CT image reconstructions and MIPs were obtained to evaluate the vascular anatomy. RADIATION  DOSE REDUCTION: This exam was performed according to the departmental dose-optimization program which includes automated exposure control, adjustment of the mA and/or kV according to patient size and/or use of iterative reconstruction technique. CONTRAST:  24m OMNIPAQUE IOHEXOL 350 MG/ML SOLN COMPARISON:  05/05/2021 FINDINGS: Cardiovascular: New very large pericardial effusion. Recommend cardiology consultation. The heart is normal in size. Stable thoracic aorta. No dissection or focal aneurysm. The pulmonary arterial tree is well opacified. No filling defects to suggest pulmonary embolism. The left pulmonary artery branches are severely compressed because of the large left lower lobe lung mass, obstructed left lung and large complex left pleural effusion. Mediastinum/Nodes: Progressive subcarinal adenopathy. Lungs/Pleura: Complete left lower lobe atelectasis with obstruction of the left lower lobe bronchus. There is a PleurX drainage catheter in place but there is a persistent large loculated left pleural fluid collection and significant atelectasis of most of the lung. Small right pleural effusion with overlying  atelectasis. No right-sided pulmonary lesions or pulmonary nodules. Upper Abdomen: No significant upper abdominal findings. Musculoskeletal: No breast masses, supraclavicular or axillary adenopathy. The bony thorax is grossly intact. Stable sclerotic lesion involving the L1 vertebral body. Review of the MIP images confirms the above findings. IMPRESSION: 1. No CT findings for pulmonary embolism. 2. New very large pericardial effusion. Recommend cardiology consultation. 3. Complete left lower lobe atelectasis with obstruction of the left lower lobe bronchus. 4. PleurX drainage catheter in place but there is a persistent large loculated left pleural fluid collection and significant atelectasis of most of the lung. 5. Progressive subcarinal adenopathy. 6. Small right pleural effusion with overlying atelectasis. 7. Stable sclerotic lesion involving the L1 vertebral body. 8. Aortic atherosclerosis. Aortic Atherosclerosis (ICD10-I70.0). Electronically Signed   By: PMarijo SanesM.D.   On: 06/30/2021 14:18   CARDIAC CATHETERIZATION  Result Date: 06/30/2021 Successful pericardiocentesis via the subxiphoid area with removal of 1065 mL of bloody fluid. 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 Recommendations: The drainage was secured in place.  Monitor output and repeat limited echocardiogram tomorrow.  Suspect that the drainage can likely be removed tomorrow if no significant output. Will use fentanyl for pain control.   DG Chest Port 1 View  Result Date: 07/23/2021 CLINICAL DATA:  Stage IV lung cancer patient with large encompassing left pleural effusion with PleurX drainage catheter in place, returns with shortness of breath and chest pain. EXAM: PORTABLE CHEST 1 VIEW COMPARISON:  Portable chest 07/06/2021, chest CT 06/30/2021. FINDINGS: Left basilar PleurX drainage catheter remains coiled in the lower lateral left chest. Large circumferential left pleural effusion has probably at  least mildly enlarged compared to the prior study. There is only a small area of preserved aeration in the lateral left mid lung, smaller than previously. The heart silhouette is obscured on the left. Central vessels visible on the right are normal caliber and a small right pleural effusion appears increased with increased hazy consolidation or atelectasis in the right base. Right lung fields are otherwise clear. There are numerous overlying monitor wires on the right. Thoracic cage grossly intact. IMPRESSION: 1. PleurX drainage catheter lower lateral left chest unchanged in position with large left pleural effusion likely at least mildly increased today. 2. Increasing small right pleural effusion and hazy consolidation or atelectasis in the right base. 3. The cardiac silhouette obscured on the left. Central vessels are normal caliber. Electronically Signed   By: KTelford NabM.D.   On: 07/23/2021 22:29   DG CHEST PORT 1 VIEW  Result  Date: 07/06/2021 CLINICAL DATA:  Follow up pleural effusion following chest tube placement. History of lung cancer. EXAM: PORTABLE CHEST 1 VIEW COMPARISON:  Radiographs 07/02/2021 and 06/30/2021.  CT 06/30/2021. FINDINGS: 0535 hours. Left PleurX catheter remains partially looped inferiorly in the left hemithorax. Previously demonstrated large left pleural effusion has minimally decreased with resulting mild increase in left lung aeration. The visualized heart size and mediastinal contours are stable with a large pericardial effusion identified on recent CT. The right lung is clear. There is no evidence of pneumothorax or significant right-sided pleural effusion. The bones appear unchanged. IMPRESSION: 1. Interval minimal decrease in size of left pleural effusion with mildly increased aeration of the left lung. This effusion appears loculated on CT. 2. Persistent enlargement of the cardiac silhouette corresponding without large pericardial effusion on recent CT. Electronically  Signed   By: Richardean Sale M.D.   On: 07/06/2021 08:38   DG Chest Port 1 View  Result Date: 07/02/2021 CLINICAL DATA:  Recurrent left pleural effusion EXAM: PORTABLE CHEST 1 VIEW COMPARISON:  CT chest and chest radiographs dated June 30, 2021 FINDINGS: Near complete opacification of the left hemithorax due to effusion, tumor and lung atelectasis, unchanged. Small area of aerated lungs is also unchanged. Left-sided chest tube is present. No appreciable pneumothorax. Mediastinal shift to the right. Right lung is clear. No acute osseous abnormality. IMPRESSION: No significant interval change. Electronically Signed   By: Keane Police D.O.   On: 07/02/2021 10:00   ECHOCARDIOGRAM COMPLETE  Result Date: 06/30/2021    ECHOCARDIOGRAM REPORT   Patient Name:   ZYANN MABRY Date of Exam: 06/30/2021 Medical Rec #:  [id number]          Height:       62.0 in Accession #:    [id number]         Weight:       110.0 lb Date of Birth:  25-Apr-1951          BSA:          1.483 m Patient Age:    79 years           BP:           114/82 mmHg Patient Gender: F                  HR:           109 bpm. Exam Location:  Inpatient Procedure: 2D Echo, Cardiac Doppler and Color Doppler STAT ECHO Indications:    Pericardial effusion I31.3  History:        Patient has prior history of Echocardiogram examinations, most                 recent 01/18/2021. Risk Factors:Diabetes and Dyslipidemia. Lung                 Cancer.  Sonographer:    Darlina Sicilian RDCS Referring Phys: [id number] [facility]  Sonographer Comments: Recent Thoracentesis in Apical position. Imaging best from subcostal. IMPRESSIONS  1. Left ventricular ejection fraction, by estimation, is 60 to 65%. The left ventricle has normal function. The left ventricle has no regional wall motion abnormalities. Left ventricular diastolic parameters are consistent with Grade I diastolic dysfunction (impaired relaxation).  2. Right ventricular systolic function is normal. The right  ventricular size is normal. There is normal pulmonary artery systolic pressure.  3. Compared with the echo 01/2021, pericardial effusion is larger. Large pericardial effusion measuring up to 4.2 cm. Large  pericardial effusion. The pericardial effusion is circumferential. Findings are consistent with cardiac tamponade.  4. The mitral valve is normal in structure. No evidence of mitral valve regurgitation. No evidence of mitral stenosis.  5. The aortic valve is tricuspid. Aortic valve regurgitation is not visualized. No aortic stenosis is present.  6. The inferior vena cava is normal in size with <50% respiratory variability, suggesting right atrial pressure of 8 mmHg. FINDINGS  Left Ventricle: Left ventricular ejection fraction, by estimation, is 60 to 65%. The left ventricle has normal function. The left ventricle has no regional wall motion abnormalities. The left ventricular internal cavity size was normal in size. There is  no left ventricular hypertrophy. Left ventricular diastolic parameters are consistent with Grade I diastolic dysfunction (impaired relaxation). Right Ventricle: The right ventricular size is normal. No increase in right ventricular wall thickness. Right ventricular systolic function is normal. There is normal pulmonary artery systolic pressure. The tricuspid regurgitant velocity is 1.94 m/s, and  with an assumed right atrial pressure of 8 mmHg, the estimated right ventricular systolic pressure is 46.6 mmHg. Left Atrium: Left atrial size was normal in size. Right Atrium: Right atrial size was normal in size. Pericardium: Compared with the echo 01/2021, pericardial effusion is larger. Large pericardial effusion measuring up to 4.2 cm. A large pericardial effusion is present. The pericardial effusion is circumferential. There is diastolic collapse of the right ventricular free wall, diastolic collapse of the right atrial wall and excessive respiratory variation in the mitral valve spectral Doppler  velocities. There is evidence of cardiac tamponade. Mitral Valve: The mitral valve is normal in structure. No evidence of mitral valve regurgitation. No evidence of mitral valve stenosis. Tricuspid Valve: The tricuspid valve is normal in structure. Tricuspid valve regurgitation is mild . No evidence of tricuspid stenosis. Aortic Valve: The aortic valve is tricuspid. Aortic valve regurgitation is not visualized. No aortic stenosis is present. Pulmonic Valve: The pulmonic valve was normal in structure. Pulmonic valve regurgitation is not visualized. No evidence of pulmonic stenosis. Aorta: The aortic root is normal in size and structure. Venous: The inferior vena cava is normal in size with less than 50% respiratory variability, suggesting right atrial pressure of 8 mmHg. IAS/Shunts: No atrial level shunt detected by color flow Doppler.  LEFT VENTRICLE PLAX 2D LVOT diam:     1.90 cm LV SV:         33 LV SV Index:   23 LVOT Area:     2.84 cm  AORTIC VALVE LVOT Vmax:   93.20 cm/s LVOT Vmean:  62.300 cm/s LVOT VTI:    0.118 m MITRAL VALVE               TRICUSPID VALVE MV Area (PHT): 4.86 cm    TR Peak grad:   15.1 mmHg MV Decel Time: 156 msec    TR Vmax:        194.00 cm/s MV E velocity: 43.30 cm/s MV A velocity: 58.30 cm/s  SHUNTS MV E/A ratio:  0.74        Systemic VTI:  0.12 m                            Systemic Diam: 1.90 cm Skeet Latch MD Electronically signed by Skeet Latch MD Signature Date/Time: 06/30/2021/3:43:19 PM    Final    ECHOCARDIOGRAM LIMITED  Result Date: 07/24/2021    ECHOCARDIOGRAM LIMITED REPORT   Patient Name:   JENNILYN ESTEVE  Date of Exam: 07/24/2021 Medical Rec #:  [id number]          Height:       62.0 in Accession #:    [id number]         Weight:       102.1 lb Date of Birth:  08-24-1950          BSA:          1.436 m Patient Age:    50 years           BP:           100/65 mmHg Patient Gender: F                  HR:           98 bpm. Exam Location:  Inpatient Procedure: Limited  Color Doppler, Cardiac Doppler and Limited Echo Indications:    pericardial effusion  History:        Patient has prior history of Echocardiogram examinations, most                 recent 07/05/2021. Lung cancer. recurrent left pleural effusion.  Sonographer:    Johny Chess RDCS Referring Phys: [id number] [place]  1. Left ventricular ejection fraction, by estimation, is 65 to 70%. The left ventricle has normal function.  2. Right ventricular systolic function is normal. The right ventricular size is normal. There is mildly elevated pulmonary artery systolic pressure.  3. A small pericardial effusion is visualized mainly posterior to the RA. There is no tamponade.  4. The mitral valve is grossly normal. Trivial mitral valve regurgitation.  5. Aortic valve regurgitation is not visualized.  6. The inferior vena cava is normal in size with greater than 50% respiratory variability, suggesting right atrial pressure of 3 mmHg. Comparison(s): No significant change from prior study on 07/05/21. FINDINGS  Left Ventricle: Left ventricular ejection fraction, by estimation, is 65 to 70%. The left ventricle has normal function. The left ventricular internal cavity size was normal in size. There is no left ventricular hypertrophy. Right Ventricle: The right ventricular size is normal. Right ventricular systolic function is normal. There is mildly elevated pulmonary artery systolic pressure. The tricuspid regurgitant velocity is 3.23 m/s, and with an assumed right atrial pressure of 3 mmHg, the estimated right ventricular systolic pressure is 92.4 mmHg. Pericardium: A small pericardial effusion is visualized mainly posterior to the RA. There is no tamponade. Mitral Valve: The mitral valve is grossly normal. Trivial mitral valve regurgitation. Tricuspid Valve: The tricuspid valve is normal in structure. Tricuspid valve regurgitation is trivial. Aortic Valve: Aortic valve regurgitation is not visualized. Pulmonic  Valve: The pulmonic valve was normal in structure. Pulmonic valve regurgitation is trivial. Venous: The inferior vena cava is normal in size with greater than 50% respiratory variability, suggesting right atrial pressure of 3 mmHg. LEFT VENTRICLE PLAX 2D LVIDd:         4.00 cm LVIDs:         2.60 cm LV PW:         1.00 cm LV IVS:        0.70 cm  IVC IVC diam: 1.40 cm LEFT ATRIUM         Index LA diam:    2.80 cm 1.95 cm/m  AORTIC VALVE LVOT Vmax:   84.40 cm/s LVOT Vmean:  51.800 cm/s LVOT VTI:    0.113 m TRICUSPID VALVE TR Peak grad:   41.7 mmHg  TR Vmax:        323.00 cm/s  SHUNTS Systemic VTI: 0.11 m Gwyndolyn Kaufman MD Electronically signed by Gwyndolyn Kaufman MD Signature Date/Time: 07/24/2021/5:46:54 PM    Final    ECHOCARDIOGRAM LIMITED  Result Date: 07/05/2021    ECHOCARDIOGRAM LIMITED REPORT   Patient Name:   YULISSA NEEDHAM Date of Exam: 07/05/2021 Medical Rec #:  [id number]          Height:       62.0 in Accession #:    [id number]         Weight:       104.1 lb Date of Birth:  Jul 10, 1950          BSA:          1.448 m Patient Age:    54 years           BP:           110/72 mmHg Patient Gender: F                  HR:           97 bpm. Exam Location:  Inpatient Procedure: Limited Echo, Color Doppler and Cardiac Doppler Indications:    I31.3 Pericardial effusion (noninflammatory)  History:        Patient has prior history of Echocardiogram examinations, most                 recent 07/02/2021. Pericardial Disease, Abnormal ECG,                 Arrythmias:Tachycardia; Signs/Symptoms:Syncope. Cardiac                 tamponade. Pleural effusion. Lung and breast cancer.  Sonographer:    Roseanna Rainbow RDCS Referring Phys: [id number] [place]  Sonographer Comments: Technically difficult study due to poor echo windows. Patient supine with left pleural drain. IMPRESSIONS  1. Abnormal septal motion . Left ventricular ejection fraction, by estimation, is 60 to 65%. The left ventricle has normal function. The  left ventricle has no regional wall motion abnormalities.  2. Right ventricular systolic function is normal. The right ventricular size is normal.  3. Right atrial size was mildly dilated.  4. The pericardial effusion is RA posterior.  5. The mitral valve is normal in structure. No evidence of mitral valve regurgitation. No evidence of mitral stenosis.  6. The aortic valve is normal in structure. There is mild calcification of the aortic valve. Aortic valve regurgitation is not visualized. Aortic valve sclerosis is present, with no evidence of aortic valve stenosis.  7. The inferior vena cava is normal in size with greater than 50% respiratory variability, suggesting right atrial pressure of 3 mmHg. FINDINGS  Left Ventricle: Abnormal septal motion. Left ventricular ejection fraction, by estimation, is 60 to 65%. The left ventricle has normal function. The left ventricle has no regional wall motion abnormalities. The left ventricular internal cavity size was normal in size. There is no left ventricular hypertrophy. Right Ventricle: The right ventricular size is normal. No increase in right ventricular wall thickness. Right ventricular systolic function is normal. Left Atrium: Left atrial size was normal in size. Right Atrium: Right atrial size was mildly dilated. Pericardium: Trivial pericardial effusion is present. The pericardial effusion is RA posterior. Mitral Valve: The mitral valve is normal in structure. No evidence of mitral valve stenosis. Tricuspid Valve: The tricuspid valve is normal in structure. Tricuspid valve regurgitation is mild . No evidence of  tricuspid stenosis. Aortic Valve: The aortic valve is normal in structure. There is mild calcification of the aortic valve. Aortic valve regurgitation is not visualized. Aortic valve sclerosis is present, with no evidence of aortic valve stenosis. Pulmonic Valve: The pulmonic valve was normal in structure. Pulmonic valve regurgitation is not visualized. No  evidence of pulmonic stenosis. Aorta: The aortic root is normal in size and structure. Venous: The inferior vena cava is normal in size with greater than 50% respiratory variability, suggesting right atrial pressure of 3 mmHg. IAS/Shunts: No atrial level shunt detected by color flow Doppler. LEFT VENTRICLE PLAX 2D LVIDd:         3.70 cm LVIDs:         2.60 cm LV PW:         0.90 cm LV IVS:        1.00 cm  LV Volumes (MOD) LV vol d, MOD A2C: 53.8 ml LV vol d, MOD A4C: 58.5 ml LV vol s, MOD A2C: 21.7 ml LV vol s, MOD A4C: 22.2 ml LV SV MOD A2C:     32.1 ml LV SV MOD A4C:     58.5 ml LV SV MOD BP:      36.5 ml IVC IVC diam: 1.70 cm LEFT ATRIUM         Index LA diam:    2.40 cm 1.66 cm/m   AORTA Ao Root diam: 2.90 cm Jenkins Rouge MD Electronically signed by Jenkins Rouge MD Signature Date/Time: 07/05/2021/10:19:36 AM    Final    ECHOCARDIOGRAM LIMITED  Result Date: 07/02/2021    ECHOCARDIOGRAM LIMITED REPORT   Patient Name:   JAMESA TEDRICK Date of Exam: 07/02/2021 Medical Rec #:  [id number]          Height:       62.0 in Accession #:    [id number]         Weight:       110.0 lb Date of Birth:  [birth year]          BSA:          1.483 m Patient Age:    19 years           BP:           101/73 mmHg Patient Gender: F                  HR:           102 bpm. Exam Location:  Inpatient Procedure: Limited Echo Indications:    Pericardial effusion I31.3  History:        Patient has prior history of Echocardiogram examinations, most                 recent 07/01/2021. Risk Factors:Diabetes and Dyslipidemia. Lung                 Cancer.  Sonographer:    Darlina Sicilian RDCS Referring Phys: [id number] [facility] A CHANDRASEKHAR  Conclusion(s)/Recommendation(s): Normal LV function, EF 60-65%. RV normal size and function. Small pericardial effusion along the RV free wall. No cardiac tamponade. Phineas Inches Electronically signed by Phineas Inches Signature Date/Time: 07/02/2021/9:05:55 AM    Final    ECHOCARDIOGRAM LIMITED  Result Date:  07/01/2021    ECHOCARDIOGRAM LIMITED REPORT   Patient Name:   LAPORSCHA LINEHAN Date of Exam: 07/01/2021 Medical Rec #:  [id number]          Height:       62.0 in Accession #:  [id number]         Weight:       110.0 lb Date of Birth:  1950-10-27          BSA:          1.483 m Patient Age:    39 years           BP:           94/72 mmHg Patient Gender: F                  HR:           126 bpm. Exam Location:  Inpatient Procedure: Limited Echo Indications:    Pericardial Effusion  History:        Patient has prior history of Echocardiogram examinations, most                 recent 06/30/2021.  Sonographer:    Jefferey Pica Referring Phys: [id number] LIDCVUDT A ARIDA  Sonographer Comments: Unable to obtain images from all windows due to dressings. IMPRESSIONS  1. Limited study to assess pericardial effusion; small pericardial effusion predominantly surrounding right atrium; effusion much smaller compared to 06/30/20.  2. Left ventricular ejection fraction, by estimation, is 50 to 55%. The left ventricle has low normal function. The left ventricle has no regional wall motion abnormalities.  3. Right ventricular systolic function is normal. The right ventricular size is normal.  4. A small pericardial effusion is present.  5. The mitral valve is normal in structure.  6. The aortic valve has an indeterminant number of cusps.  7. The inferior vena cava is normal in size with greater than 50% respiratory variability, suggesting right atrial pressure of 3 mmHg. FINDINGS  Left Ventricle: Left ventricular ejection fraction, by estimation, is 50 to 55%. The left ventricle has low normal function. The left ventricle has no regional wall motion abnormalities. The left ventricular internal cavity size was normal in size. Right Ventricle: The right ventricular size is normal. Right ventricular systolic function is normal. Left Atrium: Left atrial size was normal in size. Right Atrium: Right atrial size was normal in size. Pericardium: A  small pericardial effusion is present. Mitral Valve: The mitral valve is normal in structure. Tricuspid Valve: The tricuspid valve is normal in structure. Aortic Valve: The aortic valve has an indeterminant number of cusps. Venous: The inferior vena cava is normal in size with greater than 50% respiratory variability, suggesting right atrial pressure of 3 mmHg. Additional Comments: Limited study to assess pericardial effusion; small pericardial effusion predominantly surrounding right atrium; effusion much smaller compared to 06/30/20. LEFT VENTRICLE PLAX 2D LVIDd:         4.20 cm LVIDs:         3.00 cm LV PW:         1.10 cm LV IVS:        1.00 cm LVOT diam:     2.00 cm LVOT Area:     3.14 cm  IVC IVC diam: 2.00 cm LEFT ATRIUM         Index LA diam:    3.20 cm 2.16 cm/m   AORTA Ao Root diam: 3.50 cm Ao Asc diam:  3.50 cm  SHUNTS Systemic Diam: 2.00 cm Kirk Ruths MD Electronically signed by Kirk Ruths MD Signature Date/Time: 07/01/2021/11:59:27 AM    Final    ECHOCARDIOGRAM LIMITED  Result Date: 06/30/2021    ECHOCARDIOGRAM LIMITED REPORT   Patient Name:   MERYL HUBERS Date  of Exam: 06/30/2021 Medical Rec #:  [id number]          Height:       62.0 in Accession #:    [id number]         Weight:       110.0 lb Date of Birth:  [birth year]          BSA:          1.483 m Patient Age:    62 years           BP:           116/68 mmHg Patient Gender: F                  HR:           104 bpm. Exam Location:  Inpatient Procedure: Limited Echo       STAT ECHO Reported to: DR Fletcher Anon. Indications:    pericardiocentesis  History:        Patient has prior history of Echocardiogram examinations, most                 recent 06/30/2020. Cancer.  Sonographer:    Johny Chess RDCS Referring Phys: [place]  1. Pericardiocenteisis performed. Prior to the procedure there was a large, circumferential pericardial effusion. After pericardiocentesis there was no effusion present. Resolution of right  ventricular diastolic collapse.  2. Left ventricular ejection fraction, by estimation, is 60 to 65%. The left ventricle has normal function.  3. Large pericardial effusion. The pericardial effusion is circumferential. FINDINGS  Left Ventricle: Left ventricular ejection fraction, by estimation, is 60 to 65%. The left ventricle has normal function. Pericardium: A large pericardial effusion is present. The pericardial effusion is circumferential. Skeet Latch MD Electronically signed by Skeet Latch MD Signature Date/Time: 06/30/2021/7:31:10 PM    Final     Microbiology: Results for orders placed or performed during the hospital encounter of 07/23/21  Resp Panel by RT-PCR (Flu A&B, Covid) Nasopharyngeal Swab     Status: None   Collection Time: 07/24/21 12:09 AM   Specimen: Nasopharyngeal Swab; Nasopharyngeal(NP) swabs in vial transport medium  Result Value Ref Range Status   SARS Coronavirus 2 by RT PCR NEGATIVE NEGATIVE Final    Comment: (NOTE) SARS-CoV-2 target nucleic acids are NOT DETECTED.  The SARS-CoV-2 RNA is generally detectable in upper respiratory specimens during the acute phase of infection. The lowest concentration of SARS-CoV-2 viral copies this assay can detect is 138 copies/mL. A negative result does not preclude SARS-Cov-2 infection and should not be used as the sole basis for treatment or other patient management decisions. A negative result may occur with  improper specimen collection/handling, submission of specimen other than nasopharyngeal swab, presence of viral mutation(s) within the areas targeted by this assay, and inadequate number of viral copies(<138 copies/mL). A negative result must be combined with clinical observations, patient history, and epidemiological information. The expected result is Negative.  Fact Sheet for Patients:  EntrepreneurPulse.com.au  Fact Sheet for Healthcare Providers:   IncredibleEmployment.be  This test is no t yet approved or cleared by the [place] FDA and  has been authorized for detection and/or diagnosis of SARS-CoV-2 by FDA under an Emergency Use Authorization (EUA). This EUA will remain  in effect (meaning this test can be used) for the duration of the COVID-19 declaration under Section 564(b)(1) of the Act, 21 U.S.C.section 360bbb-3(b)(1), unless the authorization is terminated  or revoked sooner.  Influenza A by PCR NEGATIVE NEGATIVE Final   Influenza B by PCR NEGATIVE NEGATIVE Final    Comment: (NOTE) The Xpert Xpress SARS-CoV-2/FLU/RSV plus assay is intended as an aid in the diagnosis of influenza from Nasopharyngeal swab specimens and should not be used as a sole basis for treatment. Nasal washings and aspirates are unacceptable for Xpert Xpress SARS-CoV-2/FLU/RSV testing.  Fact Sheet for Patients: EntrepreneurPulse.com.au  Fact Sheet for Healthcare Providers: IncredibleEmployment.be  This test is not yet approved or cleared by the [place] FDA and has been authorized for detection and/or diagnosis of SARS-CoV-2 by FDA under an Emergency Use Authorization (EUA). This EUA will remain in effect (meaning this test can be used) for the duration of the COVID-19 declaration under Section 564(b)(1) of the Act, 21 U.S.C. section 360bbb-3(b)(1), unless the authorization is terminated or revoked.  Performed at [facility], [place] [address] [place], [place] [address]     Labs: CBC: Recent Labs  Lab 07/20/21 1432 07/23/21 2231 07/24/21 0145  WBC 23.1* 20.0* 18.4*  NEUTROABS 20.3*  --   --   HGB 12.0 11.3* 10.2*  HCT 37.5 36.2 32.1*  MCV 85.4 87.2 87.7  PLT 485* 337 354   Basic Metabolic Panel: Recent Labs  Lab 07/20/21 1432 07/23/21 2231 07/24/21 0145  NA 129* 128* 130*  K 4.1 4.3 4.5  CL 89* 96* 97*  CO2 33* 20* 23  GLUCOSE 135* 90 91  BUN  _0 CREATININE 0.41* 0.43* 0.37*  CALCIUM 8.7* 8.2* 8.0*   Liver Function Tests: Recent Labs  Lab 07/20/21 1432 07/23/21 2231  AST 8* 10*  ALT 10 9  ALKPHOS 70 53  BILITOT 0.3 0.5  PROT 5.8* 5.3*  ALBUMIN 3.1* 2.4*   CBG: No results for input(s): GLUCAP in the last 168 hours.  Discharge time spent: greater than 30 minutes.  Signed: Jinny Sweetland, DO [facility] 07/26/2021

## 2021-07-26 NOTE — TOC Initial Note (Addendum)
Transition of Care [facility]) - Initial/Assessment Note    Patient Details  Name: Tami Stone MRN: [id number] Date of Birth: 07/30/1950  [facility] CM/SW Contact:    Marilu Favre, RN Phone Number: 07/26/2021, 11:05 AM  Clinical Narrative:                 Spoke to patient at bedside. Confirmed face sheet information. Also confirmed patient wants to discharge home with hospice care through [facility].   Spoke with Misty with [facility], she is ordering home oxygen, hospital bed and over the table bed and suction machine.   PAtient aware suction machine does not connect to [employer] and insurance does not cover purewick. Also patient aware she/family will need to order purewick, information was already placed on AVS. Patient asked for number, NCM provided patient with same.   DME agency is going to reach out to husband to see if he can accept DME by 2 pm. Once DME in home, NCM will arrange PT[facility]. Patient aware.   70 Patient's husband [place] at bedside. DME has been delivered. Patient and Patrick Jupiter ready for discharge. Nurse messaged MD for discharge order etc. PTAR paperwork completed will call once discharge is confirmed .   1430 PTAR called, estimated one hour for pick up. Secure chatted nurse and [facility]   Expected Discharge Plan: Home w Hospice Care Barriers to Discharge: No Barriers Identified   Patient Goals and CMS Choice Patient states their goals for this hospitalization and ongoing recovery are:: to return to home CMS Medicare.gov Compare Post Acute Care list provided to:: Patient Choice offered to / list presented to : Patient  Expected Discharge Plan and Services Expected Discharge Plan: [place]   Discharge Planning Services: CM Consult Post Acute Care Choice: Hospice Living arrangements for the past 2 months: [place]: Hospice and [place]        Prior  Living Arrangements/Services Living arrangements for the past 2 months: Single Family Home Lives with:: Spouse Patient language and need for interpreter reviewed:: Yes Do you feel safe going back to the place where you live?: Yes      Need for Family Participation in Patient Care: Yes (Comment) Care giver support system in place?: Yes (comment)   Criminal Activity/Legal Involvement Pertinent to Current Situation/Hospitalization: No - Comment as needed  Activities of Daily Living Home Assistive Devices/Equipment: Shower chair with back ADL Screening (condition at time of admission) Patient's cognitive ability adequate to safely complete daily activities?: Yes Is the patient deaf or have difficulty hearing?: No Does the patient have difficulty seeing, even when wearing glasses/contacts?: No Does the patient have difficulty concentrating, remembering, or making decisions?: No Patient able to express need for assistance with ADLs?: Yes Does the patient have difficulty dressing or bathing?: Yes Independently performs ADLs?: No Communication: Independent Dressing (OT): Needs assistance Is this a change from baseline?: Pre-admission baseline Grooming: Needs assistance Is this a change from baseline?: Change from baseline, expected to last >3 days Feeding: Independent Bathing: Needs assistance Is this a change from baseline?: Pre-admission baseline Toileting: Needs assistance Is this a change from baseline?: Pre-admission baseline In/Out Bed: Dependent Is this a change from baseline?: Change from baseline, expected to last >3 days Walks in Home: Dependent Is this a change from baseline?: Change  from baseline, expected to last >3 days Does the patient have difficulty walking or climbing stairs?: Yes Weakness of Legs: Both Weakness of Arms/Hands: Both  Permission Sought/Granted   Permission granted to share information with : No              Emotional Assessment Appearance::  Appears stated age Attitude/Demeanor/Rapport: Engaged Affect (typically observed): Accepting Orientation: : Oriented to Situation, Oriented to  Time, Oriented to Place, Oriented to Self Alcohol / Substance Use: Not Applicable Psych Involvement: No (comment)  Admission diagnosis:  Hyponatremia [E87.1] Hypoalbuminemia [E88.09] Pleural effusion [J90] Protein-calorie malnutrition, severe (HCC) [E43] Adenocarcinoma of lung, stage 4, unspecified laterality (HCC) [C34.90] Primary malignant neoplasm of lung metastatic to other site, unspecified laterality (HCC) [C34.90] Dyspnea, unspecified type [R06.00] 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, C79.31] Patient Active Problem List   Diagnosis Date Noted   Hypotension 07/24/2021   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   Pleural effusion 07/23/2021   Palliative care patient 07/08/2021   Non-small cell lung cancer (NSCLC) ([place]) 07/05/2021   Dysphagia 07/05/2021   DM II (diabetes mellitus, type II), controlled ([place]) 07/05/2021   Hypothyroid 07/05/2021   Hyponatremia 07/05/2021   Leukocytosis 07/05/2021   Thrombocytosis 07/05/2021   Normocytic anemia 07/05/2021   Atrial tachycardia (HCC)    Cardiac tamponade 06/30/2021   Pericardial effusion 06/30/2021   Vasogenic edema ([place]) 06/23/2021   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 [place]. [place] [address] Phone: [phone] Fax: [phone]     Social Determinants of Health (SDOH) Interventions    Readmission Risk Interventions No flowsheet data found.

## 2021-07-27 ENCOUNTER — Ambulatory Visit: Payer: Medicare Other | Admitting: Nurse Practitioner

## 2021-07-28 ENCOUNTER — Encounter: Payer: Self-pay | Admitting: *Deleted

## 2021-07-28 ENCOUNTER — Telehealth: Payer: Self-pay | Admitting: Internal Medicine

## 2021-07-28 NOTE — Progress Notes (Signed)
Received call from [facility], [facility] RN, requesting verbal orders on the pleurex drain. Patient was admitted to Hospice yesterday. Holly's cell phone number is [phone]. Orders given per Dr. Marin Olp. Drain the pleurex every three days. She verbalized understanding.

## 2021-08-10 ENCOUNTER — Ambulatory Visit: Payer: Medicare Other | Admitting: Hematology & Oncology

## 2021-08-10 ENCOUNTER — Other Ambulatory Visit: Payer: Medicare Other

## 2021-08-10 ENCOUNTER — Ambulatory Visit: Payer: Medicare Other

## 2021-08-11 DEATH — deceased
